# Patient Record
Sex: Female | Born: 1950
Health system: Southern US, Community
[De-identification: ages and names within clinical notes are randomized; demographics above are authoritative.]

## PROBLEM LIST (undated history)

## (undated) DIAGNOSIS — D649 Anemia, unspecified: Secondary | ICD-10-CM

## (undated) DIAGNOSIS — I10 Essential (primary) hypertension: Secondary | ICD-10-CM

## (undated) DIAGNOSIS — Z9889 Other specified postprocedural states: Secondary | ICD-10-CM

## (undated) DIAGNOSIS — G839 Paralytic syndrome, unspecified: Secondary | ICD-10-CM

## (undated) DIAGNOSIS — E119 Type 2 diabetes mellitus without complications: Secondary | ICD-10-CM

## (undated) DIAGNOSIS — R569 Unspecified convulsions: Secondary | ICD-10-CM

## (undated) DIAGNOSIS — N179 Acute kidney failure, unspecified: Secondary | ICD-10-CM

## (undated) DIAGNOSIS — G8929 Other chronic pain: Secondary | ICD-10-CM

## (undated) DIAGNOSIS — R112 Nausea with vomiting, unspecified: Secondary | ICD-10-CM

## (undated) DIAGNOSIS — I639 Cerebral infarction, unspecified: Secondary | ICD-10-CM

## (undated) DIAGNOSIS — I2510 Atherosclerotic heart disease of native coronary artery without angina pectoris: Secondary | ICD-10-CM

## (undated) DIAGNOSIS — I499 Cardiac arrhythmia, unspecified: Secondary | ICD-10-CM

## (undated) HISTORY — DX: Unspecified convulsions: R56.9

## (undated) HISTORY — PX: ECTOPIC PREGNANCY SURGERY: SHX613

## (undated) HISTORY — DX: Acute kidney failure, unspecified: N17.9

## (undated) HISTORY — DX: Type 2 diabetes mellitus without complications: E11.9

## (undated) HISTORY — DX: Essential (primary) hypertension: I10

---

## 2009-05-07 ENCOUNTER — Emergency Department: Payer: Self-pay | Admitting: Emergency Medicine

## 2019-06-06 ENCOUNTER — Inpatient Hospital Stay
Admission: EM | Admit: 2019-06-06 | Discharge: 2019-06-09 | DRG: 065 | Disposition: A | Discharge status: Rehabilitation Facility | Payer: PPO | Attending: Internal Medicine | Admitting: Internal Medicine

## 2019-06-06 ENCOUNTER — Encounter: Payer: Self-pay | Admitting: Emergency Medicine

## 2019-06-06 ENCOUNTER — Emergency Department: Payer: PPO

## 2019-06-06 ENCOUNTER — Other Ambulatory Visit: Payer: Self-pay

## 2019-06-06 ENCOUNTER — Observation Stay: Payer: PPO

## 2019-06-06 DIAGNOSIS — F17210 Nicotine dependence, cigarettes, uncomplicated: Secondary | ICD-10-CM | POA: Diagnosis present

## 2019-06-06 DIAGNOSIS — R270 Ataxia, unspecified: Secondary | ICD-10-CM | POA: Diagnosis present

## 2019-06-06 DIAGNOSIS — R414 Neurologic neglect syndrome: Secondary | ICD-10-CM | POA: Diagnosis present

## 2019-06-06 DIAGNOSIS — Z79899 Other long term (current) drug therapy: Secondary | ICD-10-CM

## 2019-06-06 DIAGNOSIS — Y9389 Activity, other specified: Secondary | ICD-10-CM

## 2019-06-06 DIAGNOSIS — I6523 Occlusion and stenosis of bilateral carotid arteries: Secondary | ICD-10-CM | POA: Diagnosis present

## 2019-06-06 DIAGNOSIS — I63233 Cerebral infarction due to unspecified occlusion or stenosis of bilateral carotid arteries: Secondary | ICD-10-CM | POA: Diagnosis not present

## 2019-06-06 DIAGNOSIS — W1839XA Other fall on same level, initial encounter: Secondary | ICD-10-CM | POA: Diagnosis present

## 2019-06-06 DIAGNOSIS — R29703 NIHSS score 3: Secondary | ICD-10-CM | POA: Diagnosis present

## 2019-06-06 DIAGNOSIS — I63511 Cerebral infarction due to unspecified occlusion or stenosis of right middle cerebral artery: Secondary | ICD-10-CM | POA: Diagnosis present

## 2019-06-06 DIAGNOSIS — R1312 Dysphagia, oropharyngeal phase: Secondary | ICD-10-CM | POA: Diagnosis present

## 2019-06-06 DIAGNOSIS — G8194 Hemiplegia, unspecified affecting left nondominant side: Secondary | ICD-10-CM | POA: Diagnosis present

## 2019-06-06 DIAGNOSIS — R4189 Other symptoms and signs involving cognitive functions and awareness: Secondary | ICD-10-CM | POA: Diagnosis present

## 2019-06-06 DIAGNOSIS — I639 Cerebral infarction, unspecified: Secondary | ICD-10-CM | POA: Diagnosis present

## 2019-06-06 DIAGNOSIS — Z03818 Encounter for observation for suspected exposure to other biological agents ruled out: Secondary | ICD-10-CM | POA: Diagnosis not present

## 2019-06-06 DIAGNOSIS — I10 Essential (primary) hypertension: Secondary | ICD-10-CM | POA: Diagnosis present

## 2019-06-06 DIAGNOSIS — I63231 Cerebral infarction due to unspecified occlusion or stenosis of right carotid arteries: Secondary | ICD-10-CM | POA: Diagnosis not present

## 2019-06-06 DIAGNOSIS — M25512 Pain in left shoulder: Secondary | ICD-10-CM | POA: Diagnosis present

## 2019-06-06 DIAGNOSIS — S01511A Laceration without foreign body of lip, initial encounter: Secondary | ICD-10-CM | POA: Diagnosis present

## 2019-06-06 DIAGNOSIS — E785 Hyperlipidemia, unspecified: Secondary | ICD-10-CM | POA: Diagnosis present

## 2019-06-06 DIAGNOSIS — G8929 Other chronic pain: Secondary | ICD-10-CM | POA: Diagnosis present

## 2019-06-06 DIAGNOSIS — I63411 Cerebral infarction due to embolism of right middle cerebral artery: Secondary | ICD-10-CM | POA: Diagnosis present

## 2019-06-06 DIAGNOSIS — R569 Unspecified convulsions: Secondary | ICD-10-CM | POA: Diagnosis not present

## 2019-06-06 DIAGNOSIS — Z1159 Encounter for screening for other viral diseases: Secondary | ICD-10-CM | POA: Diagnosis not present

## 2019-06-06 DIAGNOSIS — Z8249 Family history of ischemic heart disease and other diseases of the circulatory system: Secondary | ICD-10-CM | POA: Diagnosis not present

## 2019-06-06 DIAGNOSIS — Z825 Family history of asthma and other chronic lower respiratory diseases: Secondary | ICD-10-CM | POA: Diagnosis not present

## 2019-06-06 DIAGNOSIS — I63239 Cerebral infarction due to unspecified occlusion or stenosis of unspecified carotid arteries: Secondary | ICD-10-CM

## 2019-06-06 DIAGNOSIS — Y92238 Other place in hospital as the place of occurrence of the external cause: Secondary | ICD-10-CM | POA: Diagnosis present

## 2019-06-06 DIAGNOSIS — Z833 Family history of diabetes mellitus: Secondary | ICD-10-CM

## 2019-06-06 DIAGNOSIS — G4089 Other seizures: Secondary | ICD-10-CM | POA: Diagnosis present

## 2019-06-06 DIAGNOSIS — Z6841 Body Mass Index (BMI) 40.0 and over, adult: Secondary | ICD-10-CM

## 2019-06-06 DIAGNOSIS — F17200 Nicotine dependence, unspecified, uncomplicated: Secondary | ICD-10-CM

## 2019-06-06 DIAGNOSIS — E1165 Type 2 diabetes mellitus with hyperglycemia: Secondary | ICD-10-CM | POA: Diagnosis present

## 2019-06-06 DIAGNOSIS — G936 Cerebral edema: Secondary | ICD-10-CM | POA: Diagnosis not present

## 2019-06-06 HISTORY — DX: Other chronic pain: G89.29

## 2019-06-06 LAB — URINE DRUG SCREEN, QUALITATIVE (ARMC ONLY)
Amphetamines, Ur Screen: NOT DETECTED
Barbiturates, Ur Screen: NOT DETECTED
Benzodiazepine, Ur Scrn: NOT DETECTED
Cannabinoid 50 Ng, Ur ~~LOC~~: NOT DETECTED
Cocaine Metabolite,Ur ~~LOC~~: NOT DETECTED
MDMA (Ecstasy)Ur Screen: NOT DETECTED
Methadone Scn, Ur: NOT DETECTED
Opiate, Ur Screen: NOT DETECTED
Phencyclidine (PCP) Ur S: NOT DETECTED
Tricyclic, Ur Screen: NOT DETECTED

## 2019-06-06 LAB — COMPREHENSIVE METABOLIC PANEL
ALT: 22 U/L (ref 0–44)
AST: 26 U/L (ref 15–41)
Albumin: 4.7 g/dL (ref 3.5–5.0)
Alkaline Phosphatase: 86 U/L (ref 38–126)
Anion gap: 16 — ABNORMAL HIGH (ref 5–15)
BUN: 12 mg/dL (ref 8–23)
CO2: 20 mmol/L — ABNORMAL LOW (ref 22–32)
Calcium: 9.8 mg/dL (ref 8.9–10.3)
Chloride: 102 mmol/L (ref 98–111)
Creatinine, Ser: 0.94 mg/dL (ref 0.44–1.00)
GFR calc Af Amer: >60 mL/min (ref >60)
GFR calc non Af Amer: >60 mL/min (ref >60)
Glucose, Bld: 283 mg/dL — ABNORMAL HIGH (ref 70–99)
Potassium: 3.6 mmol/L (ref 3.5–5.1)
Sodium: 138 mmol/L (ref 135–145)
Total Bilirubin: 0.9 mg/dL (ref 0.3–1.2)
Total Protein: 8.1 g/dL (ref 6.5–8.1)

## 2019-06-06 LAB — URINALYSIS, ROUTINE W REFLEX MICROSCOPIC
Bilirubin Urine: NEGATIVE
Glucose, UA: >=500 mg/dL — AB
Ketones, ur: 20 mg/dL — AB
Leukocytes,Ua: NEGATIVE
Nitrite: NEGATIVE
Protein, ur: 100 mg/dL — AB
Specific Gravity, Urine: 1.04 — ABNORMAL HIGH (ref 1.005–1.030)
pH: 6 (ref 5.0–8.0)

## 2019-06-06 LAB — CBC
HCT: 46.5 % — ABNORMAL HIGH (ref 36.0–46.0)
Hemoglobin: 16.3 g/dL — ABNORMAL HIGH (ref 12.0–15.0)
MCH: 30.6 pg (ref 26.0–34.0)
MCHC: 35.1 g/dL (ref 30.0–36.0)
MCV: 87.2 fL (ref 80.0–100.0)
Platelets: 211 10*3/uL (ref 150–400)
RBC: 5.33 MIL/uL — ABNORMAL HIGH (ref 3.87–5.11)
RDW: 13.3 % (ref 11.5–15.5)
WBC: 9.6 10*3/uL (ref 4.0–10.5)
nRBC: 0 % (ref 0.0–0.2)

## 2019-06-06 LAB — DIFFERENTIAL
Abs Immature Granulocytes: 0.04 10*3/uL (ref 0.00–0.07)
Basophils Absolute: 0.1 10*3/uL (ref 0.0–0.1)
Basophils Relative: 1 %
Eosinophils Absolute: 0 10*3/uL (ref 0.0–0.5)
Eosinophils Relative: 0 %
Immature Granulocytes: 0 %
Lymphocytes Relative: 33 %
Lymphs Abs: 3.1 10*3/uL (ref 0.7–4.0)
Monocytes Absolute: 0.6 10*3/uL (ref 0.1–1.0)
Monocytes Relative: 6 %
Neutro Abs: 5.8 10*3/uL (ref 1.7–7.7)
Neutrophils Relative %: 60 %

## 2019-06-06 LAB — APTT: aPTT: 31 seconds (ref 24–36)

## 2019-06-06 LAB — SARS CORONAVIRUS 2 BY RT PCR (HOSPITAL ORDER, PERFORMED IN ~~LOC~~ HOSPITAL LAB): SARS Coronavirus 2: NEGATIVE

## 2019-06-06 LAB — PROTIME-INR
INR: 1 (ref 0.8–1.2)
Prothrombin Time: 13 seconds (ref 11.4–15.2)

## 2019-06-06 LAB — GLUCOSE, CAPILLARY: Glucose-Capillary: 229 mg/dL — ABNORMAL HIGH (ref 70–99)

## 2019-06-06 MED ORDER — ASPIRIN 325 MG PO TABS
325.0000 mg | ORAL_TABLET | Freq: Every day | ORAL | Status: DC
  Filled 2019-06-06: qty 1

## 2019-06-06 MED ORDER — ACETAMINOPHEN 160 MG/5ML PO SOLN
650.0000 mg | ORAL | Status: DC | PRN
  Filled 2019-06-06: qty 20.3

## 2019-06-06 MED ORDER — ATORVASTATIN CALCIUM 20 MG PO TABS
40.0000 mg | ORAL_TABLET | Freq: Every day | ORAL | Status: DC
  Administered 2019-06-06 – 2019-06-08 (×3): 40 mg via ORAL
  Filled 2019-06-06 (×3): qty 2

## 2019-06-06 MED ORDER — ACETAMINOPHEN 325 MG PO TABS
650.0000 mg | ORAL_TABLET | ORAL | Status: DC | PRN
  Administered 2019-06-09: 650 mg via ORAL
  Filled 2019-06-06: qty 2

## 2019-06-06 MED ORDER — INSULIN ASPART 100 UNIT/ML ~~LOC~~ SOLN
0.0000 [IU] | Freq: Three times a day (TID) | SUBCUTANEOUS | Status: DC
  Administered 2019-06-06: 3 [IU] via SUBCUTANEOUS
  Administered 2019-06-07: 2 [IU] via SUBCUTANEOUS
  Administered 2019-06-07 (×2): 3 [IU] via SUBCUTANEOUS
  Administered 2019-06-08: 2 [IU] via SUBCUTANEOUS
  Administered 2019-06-08: 3 [IU] via SUBCUTANEOUS
  Administered 2019-06-08: 2 [IU] via SUBCUTANEOUS
  Administered 2019-06-09: 3 [IU] via SUBCUTANEOUS
  Filled 2019-06-06 (×8): qty 1

## 2019-06-06 MED ORDER — ASPIRIN 81 MG PO CHEW
324.0000 mg | CHEWABLE_TABLET | Freq: Once | ORAL | Status: AC
  Administered 2019-06-06: 324 mg via ORAL
  Filled 2019-06-06: qty 4

## 2019-06-06 MED ORDER — HYDRALAZINE HCL 20 MG/ML IJ SOLN
10.0000 mg | Freq: Four times a day (QID) | INTRAMUSCULAR | Status: DC | PRN
  Administered 2019-06-06 (×2): 10 mg via INTRAVENOUS
  Filled 2019-06-06 (×2): qty 1

## 2019-06-06 MED ORDER — ACETAMINOPHEN 650 MG RE SUPP: 650.0000 mg | RECTAL | Status: DC | PRN

## 2019-06-06 MED ORDER — ONDANSETRON HCL 4 MG/2ML IJ SOLN: 4.0000 mg | Freq: Four times a day (QID) | INTRAMUSCULAR | Status: DC | PRN

## 2019-06-06 MED ORDER — ENOXAPARIN SODIUM 40 MG/0.4ML ~~LOC~~ SOLN
40.0000 mg | Freq: Every day | SUBCUTANEOUS | Status: DC
  Administered 2019-06-06 – 2019-06-09 (×4): 40 mg via SUBCUTANEOUS
  Filled 2019-06-06 (×4): qty 0.4

## 2019-06-06 MED ORDER — LIDOCAINE-EPINEPHRINE (PF) 2 %-1:200000 IJ SOLN
20.0000 mL | Freq: Once | INTRAMUSCULAR | Status: AC
  Administered 2019-06-06: 20 mL
  Filled 2019-06-06: qty 20

## 2019-06-06 MED ORDER — SENNOSIDES-DOCUSATE SODIUM 8.6-50 MG PO TABS
1.0000 | ORAL_TABLET | Freq: Every evening | ORAL | Status: DC | PRN
  Administered 2019-06-08: 1 via ORAL
  Filled 2019-06-06: qty 1

## 2019-06-06 MED ORDER — LABETALOL HCL 5 MG/ML IV SOLN
20.0000 mg | Freq: Once | INTRAVENOUS | Status: AC
  Administered 2019-06-06: 20 mg via INTRAVENOUS
  Filled 2019-06-06: qty 4

## 2019-06-06 MED ORDER — IOHEXOL 350 MG/ML SOLN
75.0000 mL | Freq: Once | INTRAVENOUS | Status: AC | PRN
  Administered 2019-06-06: 75 mL via INTRAVENOUS

## 2019-06-06 MED ORDER — LISINOPRIL 10 MG PO TABS
10.0000 mg | ORAL_TABLET | Freq: Every day | ORAL | Status: DC
  Administered 2019-06-06 – 2019-06-09 (×4): 10 mg via ORAL
  Filled 2019-06-06 (×4): qty 1

## 2019-06-06 MED ORDER — STROKE: EARLY STAGES OF RECOVERY BOOK
Freq: Once | Status: AC
  Administered 2019-06-06: 15:00:00

## 2019-06-06 MED ORDER — ASPIRIN 300 MG RE SUPP: 300.0000 mg | Freq: Every day | RECTAL | Status: DC

## 2019-06-06 NOTE — ED Notes (Signed)
Pt back from CT at this time 

## 2019-06-06 NOTE — ED Notes (Signed)
Pt transported to 1C by EDT Mickel Baas at this time

## 2019-06-06 NOTE — ED Notes (Signed)
CODE STROKE CALLED TO [id number] 

## 2019-06-06 NOTE — H&P (Addendum)
[facility] NAME: Tammy Nunez    MR#:  [id number]  DATE OF BIRTH:  01-Dec-1950  DATE OF ADMISSION:  06/06/2019  PRIMARY CARE PHYSICIAN: Patient, No Pcp Per   REQUESTING/REFERRING PHYSICIAN: Dr. Joni Fears  CHIEF COMPLAINT:   Chief Complaint  Patient presents with  . Numbness    HISTORY OF PRESENT ILLNESS:  Tammy Nunez  is a 68 y.o. female with no known past medical history presents to the emergency room complaining of acute onset of left-sided numbness and weakness from the time she woke up at 5:30 AM.  Slept at 10 PM when she was fine.  In the ER patient had a CT scan which shows right frontal and parietal stroke.  Seen by tele neurology.  Patient is being admitted for further stroke work-up.  She has no dysphagia or vision changes.  No shortness of breath or palpitations.  Not on aspirin or statin at home.  No history of A. fib.  Does smoke. Here her blood pressure is significantly elevated and also blood glucose is at 285.  No diagnosis of hypertension or diabetes. She has not seen a doctor in many years.  PAST MEDICAL HISTORY:  History reviewed. No pertinent past medical history. Does not have a primary care physician and no diagnosis. PAST SURGICAL HISTORY:   Past Surgical History:  Procedure Laterality Date  . ECTOPIC PREGNANCY SURGERY      SOCIAL HISTORY:   Social History   Tobacco Use  . Smoking status: Current Every Day Smoker  . Smokeless tobacco: Never Used  Substance Use Topics  . Alcohol use: Never    Frequency: Never    FAMILY HISTORY:   Hypertension DRUG ALLERGIES:  No Known Allergies  REVIEW OF SYSTEMS:   Review of Systems  Constitutional: Positive for malaise/fatigue. Negative for chills and fever.  HENT: Negative for sore throat.   Eyes: Negative for blurred vision, double vision and pain.  Respiratory: Negative for cough, hemoptysis, shortness of breath and wheezing.   Cardiovascular:  Negative for chest pain, palpitations, orthopnea and leg swelling.  Gastrointestinal: Negative for abdominal pain, constipation, diarrhea, heartburn, nausea and vomiting.  Genitourinary: Negative for dysuria and hematuria.  Musculoskeletal: Negative for back pain and joint pain.  Skin: Negative for rash.  Neurological: Positive for focal weakness. Negative for sensory change, speech change and headaches.  Endo/Heme/Allergies: Does not bruise/bleed easily.  Psychiatric/Behavioral: Negative for depression. The patient is not nervous/anxious.     MEDICATIONS AT HOME:   Prior to Admission medications   Medication Sig Start Date End Date Taking? Authorizing Provider  ibuprofen (ADVIL) 200 MG tablet Take 200-400 mg by mouth every 6 (six) hours as needed for fever or mild pain.   Yes [provider]  vitamin B-12 (CYANOCOBALAMIN) 1000 MCG tablet Take 1,000 mcg by mouth daily.   Yes [provider]     VITAL SIGNS:  Blood pressure (!) 214/110, pulse (!) 101, resp. rate 17, height 4\' 4"  (1.321 m), weight 77.1 kg, SpO2 98 %.  PHYSICAL EXAMINATION:  Physical Exam  GENERAL:  68 y.o.-year-old patient lying in the bed with no acute distress.  EYES: Pupils equal, round, reactive to light and accommodation. No scleral icterus. Extraocular muscles intact.  HEENT: Head atraumatic, normocephalic. Oropharynx and nasopharynx clear. No oropharyngeal erythema, moist oral mucosa  NECK:  Supple, no jugular venous distention. No thyroid enlargement, no tenderness.  LUNGS: Normal breath sounds bilaterally, no wheezing, rales, rhonchi. No use  of accessory muscles of respiration.  CARDIOVASCULAR: S1, S2 normal. No murmurs, rubs, or gallops.  ABDOMEN: Soft, nontender, nondistended. Bowel sounds present. No organomegaly or mass.  EXTREMITIES: No pedal edema, cyanosis, or clubbing. + 2 pedal & radial pulses b/l.   NEUROLOGIC: Cranial nerves II through XII are intact.  Decreased motor strength on  the left.  Decreased sensations on the left. PSYCHIATRIC: The patient is alert and oriented x 3. Good affect.  SKIN: No obvious rash, lesion, or ulcer.   LABORATORY PANEL:   CBC Recent Labs  Lab 06/06/19 0636  WBC 9.6  HGB 16.3*  HCT 46.5*  PLT 211   ------------------------------------------------------------------------------------------------------------------  Chemistries  Recent Labs  Lab 06/06/19 0636  NA 138  K 3.6  CL 102  CO2 20*  GLUCOSE 283*  BUN 12  CREATININE 0.94  CALCIUM 9.8  AST 26  ALT 22  ALKPHOS 86  BILITOT 0.9   ------------------------------------------------------------------------------------------------------------------  Cardiac Enzymes No results for input(s): TROPONINI in the last 168 hours. ------------------------------------------------------------------------------------------------------------------  RADIOLOGY:  Ct Angio Head W Or Wo Contrast  Result Date: 06/06/2019 CLINICAL DATA:  Numbness of the left arm upon wakening today. EXAM: CT ANGIOGRAPHY HEAD AND NECK TECHNIQUE: Multidetector CT imaging of the head and neck was performed using the standard protocol during bolus administration of intravenous contrast. Multiplanar CT image reconstructions and MIPs were obtained to evaluate the vascular anatomy. Carotid stenosis measurements (when applicable) are obtained utilizing NASCET criteria, using the distal internal carotid diameter as the denominator. CONTRAST:  53mL OMNIPAQUE IOHEXOL 350 MG/ML SOLN COMPARISON:  Head CT same day FINDINGS: CTA NECK FINDINGS Aortic arch: Aortic atherosclerosis.  No aneurysm or dissection. Right carotid system: Common carotid artery is patent to the bifurcation. There is advanced soft and calcified plaque at the carotid bifurcation and ICA bulb with near occlusion in the proximal bulb. Antegrade flow persists but minimal diameter is less than 1 mm. Cervical ICA is small because of flow reduction. Left carotid  system: Common carotid artery is patent to the bifurcation region. Extensive soft and calcified plaque at the carotid bifurcation and ICA bulb. Minimal diameter in the ICA bulb is 1.5 mm. Caliber of the more distal ICA is reduced likely due to flow reduction. Question serial stenoses in the more distal cervical ICA. Stenosis at the ICA bulb is estimated at 70% or greater. Difficult to apply NASCET criteria with distal flow reduction. Vertebral arteries: Vertebral artery origins are patent. The vertebral arteries are small vessels that are patent through the cervical region to the foramen magnum. Incidental note of a 40-50% stenosis of the left subclavian artery at the apex. Skeleton: Degenerative cervical spondylosis. Other neck: No mass or lymphadenopathy. Upper chest: Normal Review of the MIP images confirms the above findings CTA HEAD FINDINGS Anterior circulation: Both internal carotid arteries are patent through the skull base and siphon regions. There is severe atherosclerotic disease in the carotid siphon regions with narrowing. Both vessels appear patent through that area but are very small caliber. The anterior and middle cerebral vessels are patent but also are very small caliber. I cannot identify a proximal embolic occlusion that could be treatable with mechanical intervention. Posterior circulation: Both vertebral arteries are patent through the foramen magnum to the basilar. No basilar stenosis. Posterior circulation branch vessels show flow. Venous sinuses: Patent and normal. Anatomic variants: None significant. Delayed phase: Not performed. Review of the MIP images confirms the above findings IMPRESSION: Severe atherosclerotic disease at both carotid bifurcation and ICA bulbs, right  worse than left. Luminal diameter on the right is 1 mm or less in the ICA bulb. Luminal diameter on the left is 1.5 mm in the ICA bulb. There is flow reduction in the diameter of both distal cervical internal carotid  arteries, making application of NASCET criteria difficult. Stenosis is estimated at 80% or worse on the right and 70% or worse on the left. No large or medium vessel intracranial occlusion identified. Severe atherosclerotic disease in both carotid siphon regions with narrowing of both internal carotid arteries through that segment. These results were called by telephone at the time of interpretation on 06/06/2019 at 8:45 am to Dr. Carrie Mew , who verbally acknowledged these results. Electronically Signed   By: Nelson Chimes M.D.   On: 06/06/2019 08:54   Ct Head Wo Contrast  Result Date: 06/06/2019 CLINICAL DATA:  Left arm numbness upon awakening EXAM: CT HEAD WITHOUT CONTRAST TECHNIQUE: Contiguous axial images were obtained from the base of the skull through the vertex without intravenous contrast. COMPARISON:  None. FINDINGS: Brain: Cytotoxic edema in the posterior right frontal and low right parietal regions, acute appearing. No hemorrhage, hydrocephalus, or masslike finding. No pre-existing infarct is seen. Vascular: No hyperdense vessel or unexpected calcification. Skull: Normal. Negative for fracture or focal lesion. Sinuses/Orbits: No acute finding. IMPRESSION: Moderate acute appearing infarct in the right posterior frontal and parietal lobes. Electronically Signed   By: Monte Fantasia M.D.   On: 06/06/2019 07:03   Ct Angio Neck W And/or Wo Contrast  Result Date: 06/06/2019 CLINICAL DATA:  Numbness of the left arm upon wakening today. EXAM: CT ANGIOGRAPHY HEAD AND NECK TECHNIQUE: Multidetector CT imaging of the head and neck was performed using the standard protocol during bolus administration of intravenous contrast. Multiplanar CT image reconstructions and MIPs were obtained to evaluate the vascular anatomy. Carotid stenosis measurements (when applicable) are obtained utilizing NASCET criteria, using the distal internal carotid diameter as the denominator. CONTRAST:  63mL OMNIPAQUE IOHEXOL 350  MG/ML SOLN COMPARISON:  Head CT same day FINDINGS: CTA NECK FINDINGS Aortic arch: Aortic atherosclerosis.  No aneurysm or dissection. Right carotid system: Common carotid artery is patent to the bifurcation. There is advanced soft and calcified plaque at the carotid bifurcation and ICA bulb with near occlusion in the proximal bulb. Antegrade flow persists but minimal diameter is less than 1 mm. Cervical ICA is small because of flow reduction. Left carotid system: Common carotid artery is patent to the bifurcation region. Extensive soft and calcified plaque at the carotid bifurcation and ICA bulb. Minimal diameter in the ICA bulb is 1.5 mm. Caliber of the more distal ICA is reduced likely due to flow reduction. Question serial stenoses in the more distal cervical ICA. Stenosis at the ICA bulb is estimated at 70% or greater. Difficult to apply NASCET criteria with distal flow reduction. Vertebral arteries: Vertebral artery origins are patent. The vertebral arteries are small vessels that are patent through the cervical region to the foramen magnum. Incidental note of a 40-50% stenosis of the left subclavian artery at the apex. Skeleton: Degenerative cervical spondylosis. Other neck: No mass or lymphadenopathy. Upper chest: Normal Review of the MIP images confirms the above findings CTA HEAD FINDINGS Anterior circulation: Both internal carotid arteries are patent through the skull base and siphon regions. There is severe atherosclerotic disease in the carotid siphon regions with narrowing. Both vessels appear patent through that area but are very small caliber. The anterior and middle cerebral vessels are patent but also are  very small caliber. I cannot identify a proximal embolic occlusion that could be treatable with mechanical intervention. Posterior circulation: Both vertebral arteries are patent through the foramen magnum to the basilar. No basilar stenosis. Posterior circulation branch vessels show flow. Venous  sinuses: Patent and normal. Anatomic variants: None significant. Delayed phase: Not performed. Review of the MIP images confirms the above findings IMPRESSION: Severe atherosclerotic disease at both carotid bifurcation and ICA bulbs, right worse than left. Luminal diameter on the right is 1 mm or less in the ICA bulb. Luminal diameter on the left is 1.5 mm in the ICA bulb. There is flow reduction in the diameter of both distal cervical internal carotid arteries, making application of NASCET criteria difficult. Stenosis is estimated at 80% or worse on the right and 70% or worse on the left. No large or medium vessel intracranial occlusion identified. Severe atherosclerotic disease in both carotid siphon regions with narrowing of both internal carotid arteries through that segment. These results were called by telephone at the time of interpretation on 06/06/2019 at 8:45 am to Dr. Carrie Mew , who verbally acknowledged these results. Electronically Signed   By: Nelson Chimes M.D.   On: 06/06/2019 08:54     IMPRESSION AND PLAN:   *Acute CVA of the right parietal and frontal lobes. -Check MRI of the brain, Echo - Start aspirin and statin. - Lovenox for DVT prophylaxis. - PT/OT/Speech consult as needed per symptoms - Neuro checks every 4 hours for 24 hours. - Consulted neurology.  *Bilateral severe carotid artery stenosis Consult vascular surgery.  Sent message to Dr. Delana Meyer.  *Hypertension.  Significant elevation.  Likely has undiagnosed hypertension.  Will start low-dose lisinopril.  Would like permissive hypertension at this point.  IV hydralazine ordered as needed  *Hyperglycemia.  285.  Check hemoglobin A1c.  Ordered Accu-Cheks and sliding scale insulin.  Likely has undiagnosed diabetes mellitus.  Diabetic diet.  *Tobacco abuse.  Counseled to quit smoking for greater than 3 minutes.  DVT prophylaxis with Lovenox   All the records are reviewed and case discussed with ED  provider. Management plans discussed with the patient, family and they are in agreement.  CODE STATUS: Full code  TOTAL TIME TAKING CARE OF THIS PATIENT: 35 minutes.   Neita Carp M.D on 06/06/2019 at 12:47 PM  Between 7am to 6pm - Pager - [phone]  After 6pm go to [email or web address] - password [email or web address]  Office  [phone]  CC: Primary care physician; Patient, No Pcp Per  Note: This dictation was prepared with Dragon dictation along with smaller phrase technology. Any transcriptional errors that result from this process are unintentional.

## 2019-06-06 NOTE — Plan of Care (Signed)

## 2019-06-06 NOTE — ED Notes (Signed)
Pt back from MRI 

## 2019-06-06 NOTE — ED Notes (Signed)
Attempted to call report and informed that RN is in middle of discharge and unaware of getting pt. RN to call back shortly.

## 2019-06-06 NOTE — Progress Notes (Signed)
   06/06/19 0727  Clinical Encounter Type  Visited With Patient  Visit Type Initial  Referral From Nurse  Consult/Referral To Chaplain  Spiritual Encounters  Spiritual Needs Prayer;Emotional  Patient was awake and alert upon arrival. Pt was able to make wishes known. [place] completed initial spiritual care screening. Patient has emotional and spiritual support system in the community. Author prayed with patient upon request. Pastoral care visit was appreciated. No further needs are known at this time.

## 2019-06-06 NOTE — ED Notes (Signed)
Pt transported to MRI 

## 2019-06-06 NOTE — ED Notes (Signed)
Pt to CT at this time.

## 2019-06-06 NOTE — ED Triage Notes (Signed)
Patient with complaint of numbness to her left arm when she woke up this am. Patient states that she went to bed at 22:00 last night.

## 2019-06-06 NOTE — Care Management Obs Status (Signed)
[place] NOTIFICATION   Patient Details  Name: Tammy Nunez MRN: [id number] Date of Birth: December 25, 1950   Medicare Observation Status Notification Given:  Yes    Ceirra Belli, RN 06/06/2019, 10:52 AM

## 2019-06-06 NOTE — ED Provider Notes (Addendum)
[facility] Emergency Department Provider Note  ____________________________________________  Time seen: Approximately 8:04 AM  I have reviewed the triage vital signs and the nursing notes.   HISTORY  Chief Complaint Numbness    HPI Tammy Nunez is a 68 y.o. female with no known past medical history (also no established primary care) who complains of left-sided weakness and numbness that started about 10:00 PM last night suddenly.  Constant since then.  Associated with a mild bilateral frontal headache.  No aggravating or alleviating factors.  Still persistent this morning so she comes to the ED for evaluation.  Never had anything like this before.  No recent trauma fevers chills sweats body aches chest pain or shortness of breath.  Denies palpitations or syncope.      History reviewed. No pertinent past medical history.   There are no active problems to display for this patient.    Past Surgical History:  Procedure Laterality Date  . ECTOPIC PREGNANCY SURGERY       Prior to Admission medications   Medication Sig Start Date End Date Taking? Authorizing Provider  ibuprofen (ADVIL) 200 MG tablet Take 200-400 mg by mouth every 6 (six) hours as needed for fever or mild pain.   Yes [provider]  vitamin B-12 (CYANOCOBALAMIN) 1000 MCG tablet Take 1,000 mcg by mouth daily.   Yes [provider]  None  Allergies Patient has no known allergies.   No family history on file.  Social History Social History   Tobacco Use  . Smoking status: Current Every Day Smoker  . Smokeless tobacco: Never Used  Substance Use Topics  . Alcohol use: Never    Frequency: Never  . Drug use: Never    Review of Systems  Constitutional:   No fever or chills.  ENT:   No sore throat. No rhinorrhea. Cardiovascular:   No chest pain or syncope. Respiratory:   No dyspnea or cough. Gastrointestinal:   Negative for abdominal pain, vomiting and  diarrhea.  Musculoskeletal:   Negative for focal pain or swelling All other systems reviewed and are negative except as documented above in ROS and HPI.  ____________________________________________   PHYSICAL EXAM:  VITAL SIGNS: ED Triage Vitals [06/06/19 0631]  Enc Vitals Group     BP      Pulse      Resp      Temp      Temp src      SpO2      Weight 170 lb (77.1 kg)     Height 4\' 4"  (1.321 m)     Head Circumference      Peak Flow      Pain Score 0     Pain Loc      Pain Edu?      Excl. in [place]?     Vital signs reviewed, nursing assessments reviewed.   Constitutional:   Alert and oriented. Non-toxic appearance. Eyes:   Conjunctivae are normal. EOMI without nystagmus. PERRL. ENT      Head:   Normocephalic and atraumatic.      Nose:   No congestion/rhinnorhea.       Mouth/Throat:   MMM, no pharyngeal erythema. No peritonsillar mass.       Neck:   No meningismus. Full ROM. Hematological/Lymphatic/Immunilogical:   No cervical lymphadenopathy. Cardiovascular:   RRR. Symmetric bilateral radial and DP pulses.  No murmurs. Cap refill less than 2 seconds. Respiratory:   Normal respiratory effort without tachypnea/retractions. Breath sounds  are clear and equal bilaterally. No wheezes/rales/rhonchi. Gastrointestinal:   Soft and nontender. Non distended. There is no CVA tenderness.  No rebound, rigidity, or guarding.  Musculoskeletal:   Normal range of motion in all extremities. No joint effusions.  No lower extremity tenderness.  No edema. Neurologic:   Normal speech and language.  Left facial droop Strength intact bilaterally Positive left upper extremity drift Positive left lower extremity drift Left-sided sensation diminished NIH stroke scale 4  Skin:    Skin is warm, dry and intact. No rash noted.  No petechiae, purpura, or bullae.  ____________________________________________    LABS (pertinent positives/negatives) (all labs ordered are listed, but only abnormal  results are displayed) Labs Reviewed  CBC - Abnormal; Notable for the following components:      Result Value   RBC 5.33 (*)    Hemoglobin 16.3 (*)    HCT 46.5 (*)    All other components within normal limits  COMPREHENSIVE METABOLIC PANEL - Abnormal; Notable for the following components:   CO2 20 (*)    Glucose, Bld 283 (*)    Anion gap 16 (*)    All other components within normal limits  SARS CORONAVIRUS 2 (HOSPITAL ORDER, [place] [facility])  PROTIME-INR  APTT  DIFFERENTIAL  URINALYSIS, ROUTINE W REFLEX MICROSCOPIC  URINE DRUG SCREEN, QUALITATIVE ([place])   ____________________________________________   EKG  Interpreted by me Sinus tachycardia rate 116, normal axis intervals QRS ST segments and T waves  ____________________________________________    RADIOLOGY  Ct Head Wo Contrast  Result Date: 06/06/2019 CLINICAL DATA:  Left arm numbness upon awakening EXAM: CT HEAD WITHOUT CONTRAST TECHNIQUE: Contiguous axial images were obtained from the base of the skull through the vertex without intravenous contrast. COMPARISON:  None. FINDINGS: Brain: Cytotoxic edema in the posterior right frontal and low right parietal regions, acute appearing. No hemorrhage, hydrocephalus, or masslike finding. No pre-existing infarct is seen. Vascular: No hyperdense vessel or unexpected calcification. Skull: Normal. Negative for fracture or focal lesion. Sinuses/Orbits: No acute finding. IMPRESSION: Moderate acute appearing infarct in the right posterior frontal and parietal lobes. Electronically Signed   By: Monte Fantasia M.D.   On: 06/06/2019 07:03    ____________________________________________   PROCEDURES .Critical Care Performed by: Carrie Mew, MD Authorized by: Carrie Mew, MD   Critical care provider statement:    Critical care time (minutes):  30   Critical care time was exclusive of:  Separately billable procedures and treating other patients    Critical care was necessary to treat or prevent imminent or life-threatening deterioration of the following conditions:  CNS failure or compromise and circulatory failure   Critical care was time spent personally by me on the following activities:  Development of treatment plan with patient or surrogate, discussions with consultants, evaluation of patient's response to treatment, examination of patient, obtaining history from patient or surrogate, ordering and performing treatments and interventions, ordering and review of laboratory studies, ordering and review of radiographic studies, pulse oximetry, re-evaluation of patient's condition and review of old charts Comments:         Marland KitchenMarland KitchenLaceration Repair  Date/Time: 06/06/2019 1:15 PM Performed by: Carrie Mew, MD Authorized by: Carrie Mew, MD   Consent:    Consent obtained:  Verbal   Consent given by:  Patient   Risks discussed:  Infection, pain, retained foreign body, poor cosmetic result and poor wound healing   Alternatives discussed:  No treatment, referral and delayed treatment Anesthesia (see MAR for  exact dosages):    Anesthesia method:  Local infiltration   Local anesthetic:  Lidocaine 1% w/o epi Laceration details:    Location:  Lip   Lip location:  Upper exterior lip (Involves vermilion border)   Length (cm):  2 Repair type:    Repair type:  Intermediate Pre-procedure details:    Preparation:  Patient was prepped and draped in usual sterile fashion Exploration:    Hemostasis achieved with:  Direct pressure   Wound exploration: entire depth of wound probed and visualized     Wound extent: no foreign bodies/material noted, no muscle damage noted, no underlying fracture noted and no vascular damage noted     Contaminated: no   Treatment:    Area cleansed with:  Saline and Betadine   Amount of cleaning:  Extensive   Irrigation solution:  Sterile saline   Visualized foreign bodies/material removed: no   Skin  repair:    Repair method:  Sutures   Suture size:  4-0   Wound skin closure material used: Ethilon.   Suture technique:  Simple interrupted   Number of sutures:  5 Approximation:    Approximation:  Close   Vermilion border: well-aligned   Post-procedure details:    Dressing:  Open (no dressing)   Patient tolerance of procedure:  Tolerated well, no immediate complications    ____________________________________________  DIFFERENTIAL DIAGNOSIS   Large vessel occlusive stroke, carotid dissection, carotid stenosis, carotid thrombosis, embolic stroke  CLINICAL IMPRESSION / ASSESSMENT AND PLAN / ED COURSE  Medications ordered in the ED: Medications  aspirin chewable tablet 324 mg (324 mg Oral Given 06/06/19 0811)  iohexol (OMNIPAQUE) 350 MG/ML injection 75 mL (75 mLs Intravenous Contrast Given 06/06/19 0818)    Pertinent labs & imaging results that were available during my care of the patient were reviewed by me and considered in my medical decision making (see chart for details).  Kamarah Bilotta was evaluated in Emergency Department on 06/06/2019 for the symptoms described in the history of present illness. She was evaluated in the context of the global COVID-19 pandemic, which necessitated consideration that the patient might be at risk for infection with the SARS-CoV-2 virus that causes COVID-19. Institutional protocols and algorithms that pertain to the evaluation of patients at risk for COVID-19 are in a state of rapid change based on information released by regulatory bodies including the CDC and federal and state organizations. These policies and algorithms were followed during the patient's care in the ED.     Clinical Course as of Jun 06 855  Mon Jun 06, 2019  0727 Patient presents with acute stroke symptoms, onset 10 PM last night.  CT scan is positive for two discrete ischemic strokes, one MCA territory and one possibly PCA.  Exam shows relatively intact strength, but  pronounced ataxia of the left upper extremity.  VAN negative, outside of TPA window, but with embolic vs potentially proximal vascular lesion, will consult neurology for consideration of angiogram/perfusion study.   [PS]  0801 Discussed with neurologist who recommends CT angiogram of head and neck.  Perfusion study not indicated as patient will not be a candidate for endovascular intervention.  Reviewed the consult note as well.  We will give full dose aspirin, goal systolic blood pressure less than 200. Allow permissive hypertension to maintain penumbra perfusion   [PS]  9629 CT angiogram discussed with radiology.  No retrievable large vessel occlusion, but there is severe carotid disease bilaterally including approximately 90% stenosis of the right carotid  at the bifurcation, greater than 70% stenosis on the left.  Continue initial medical management, I will page vascular surgery to initiate consult   [PS]    Clinical Course User Index [PS] Carrie Mew, MD     ----------------------------------------- 1:14 PM on 06/06/2019 -----------------------------------------  Per nurse was attempting to help the patient to the bedside commode, the patient attempted to stand on her own, which caused her to fall.  She sustained a laceration to her lip which I have repaired.  No worsening headache or neck pain or neurologic changes, no need to repeat imaging at this time.  Blood pressure is 623 systolic, I will give a dose of labetalol for blood pressure control, allowing for continued permissive hypertension with a max systolic blood pressure at 200.  I did previously discuss the case with Dr. Delana Meyer who is aware and consulting for the hospitalist.  ____________________________________________   FINAL CLINICAL IMPRESSION(S) / ED DIAGNOSES    Final diagnoses:  Acute ischemic right MCA stroke [facility])  Carotid artery stenosis with cerebral infarction [facility])     ED Discharge Orders    None       Portions of this note were generated with dragon dictation software. Dictation errors may occur despite best attempts at proofreading.   Carrie Mew, MD 06/06/19 7628    Carrie Mew, MD 06/06/19 1319

## 2019-06-06 NOTE — ED Notes (Signed)
Pt does have noticeable weakness on left hand grip and left foot flexion.

## 2019-06-06 NOTE — Progress Notes (Signed)
OT Cancellation Note  Patient Details Name: Tammy Nunez MRN: [id number] DOB: [birth year]   Cancelled Evaluation:    Reason Eval/Treat Not Completed: Patient at procedure or test/ unavailable. Thank you for the OT consult. Order received and chart reviewed. Pt noted to be out for MRI at this time and is pending transfer to unit. Will hold OT eval at this time and re-attempt at a later date/time as available and pt medically appropriate for OT eval.   Shara Blazing, M.S., OTR/L Ascom: [phone] 06/06/19, 11:45 AM

## 2019-06-06 NOTE — Consult Note (Signed)
[facility] V[facility] Consult Note  MRN : [id number]  Tammy Nunez is a 68 y.o. (07-22-1951) female who presents with chief complaint of  Chief Complaint  Patient presents with  . Numbness   History of Present Illness:  The patient is a 68 year old female with no known medical history at the time of arrival to the [facility] emergency department with a chief complaint of left upper/lower sided weakness.  The patient endorses a history of experiencing an acute onset of left upper/lower extremity weakness which started around 10 PM last night.  At the time of her arrival to the emergency department, the patient denied any significant past medical history or medications.  Unfortunately, the patient has not been under the care of her primary care physician for some years.  The patient is an active smoker.  The patient is still experiencing some left upper / lower extremity weakness.  She states that it "seems better".  She states that she is now able to lift her left arm whereas this morning she was unable.  The patient denies experiencing any amaurosis fugax or TIA like symptoms prior to her stroke.  The patient denies any fever, nausea vomiting.  Patient denies any shortness of breath or chest pain.  06/06/19 CTA Head / Neck: 1) Severe atherosclerotic disease at both carotid bifurcation and ICA bulbs, right worse than left. Luminal diameter on the right is 1 mm or less in the ICA bulb. Luminal diameter on the left is 1.5 mm in the ICA bulb. There is flow reduction in the diameter of both distal cervical internal carotid arteries, making application of NASCET criteria difficult. Stenosis is estimated at 80% or worse on the right and 70% or worse on the left. 2) No large or medium vessel intracranial occlusion identified. 3) Severe atherosclerotic disease in both carotid siphon regions with narrowing of both internal carotid arteries through that  segment.  06/06/19 CT Head: Moderate acute appearing infarct in the right posterior frontal and parietal lobes  Vascular surgery was consulted by Dr. Darvin Neighbours for further recommendations. Current Facility-Administered Medications  Medication Dose Route Frequency Provider Last Rate Last Dose  . acetaminophen (TYLENOL) tablet 650 mg  650 mg Oral Q4H PRN Hillary Bow, MD       Or  . acetaminophen (TYLENOL) solution 650 mg  650 mg Per Tube Q4H PRN Hillary Bow, MD       Or  . acetaminophen (TYLENOL) suppository 650 mg  650 mg Rectal Q4H PRN Hillary Bow, MD      . Derrill Memo ON 06/07/2019] aspirin suppository 300 mg  300 mg Rectal Daily Sudini, Alveta Heimlich, MD       Or  . Derrill Memo ON 06/07/2019] aspirin tablet 325 mg  325 mg Oral Daily Sudini, Srikar, MD      . atorvastatin (LIPITOR) tablet 40 mg  40 mg Oral q1800 Sudini, Srikar, MD      . enoxaparin (LOVENOX) injection 40 mg  40 mg Subcutaneous Daily Sudini, Srikar, MD   40 mg at 06/06/19 1321  . hydrALAZINE (APRESOLINE) injection 10 mg  10 mg Intravenous Q6H PRN Hillary Bow, MD   10 mg at 06/06/19 1121  . insulin aspart (novoLOG) injection 0-9 Units  0-9 Units Subcutaneous TID WC Sudini, Srikar, MD      . lisinopril (ZESTRIL) tablet 10 mg  10 mg Oral Daily Sudini, Srikar, MD      . ondansetron [facility]) injection 4 mg  4 mg Intravenous Q6H  PRN Hillary Bow, MD      . senna-docusate (Senokot-S) tablet 1 tablet  1 tablet Oral QHS PRN Hillary Bow, MD       History reviewed. No pertinent past medical history.  Past Surgical History:  Procedure Laterality Date  . ECTOPIC PREGNANCY SURGERY     Social History Social History   Tobacco Use  . Smoking status: Current Every Day Smoker  . Smokeless tobacco: Never Used  Substance Use Topics  . Alcohol use: Never    Frequency: Never  . Drug use: Never   Family History No family history on file.  Denies any family history of peripheral artery disease, venous disease or renal disease.  No  Known Allergies  REVIEW OF SYSTEMS (Negative unless checked)  Constitutional: [] Weight loss  [] Fever  [] Chills Cardiac: [] Chest pain   [] Chest pressure   [] Palpitations   [] Shortness of breath when laying flat   [] Shortness of breath at rest   [] Shortness of breath with exertion. Vascular:  [] Pain in legs with walking   [] Pain in legs at rest   [] Pain in legs when laying flat   [] Claudication   [] Pain in feet when walking  [] Pain in feet at rest  [] Pain in feet when laying flat   [] History of DVT   [] Phlebitis   [] Swelling in legs   [] Varicose veins   [] Non-healing ulcers Pulmonary:   [] Uses home oxygen   [] Productive cough   [] Hemoptysis   [] Wheeze  [] COPD   [] Asthma Neurologic:  [] Dizziness  [] Blackouts   [] Seizures   [x] History of stroke   [] History of TIA  [] Aphasia   [] Temporary blindness   [] Dysphagia   [x] Weakness or numbness in arms   [x] Weakness or numbness in legs Musculoskeletal:  [] Arthritis   [] Joint swelling   [] Joint pain   [] Low back pain Hematologic:  [] Easy bruising  [] Easy bleeding   [] Hypercoagulable state   [] Anemic  [] Hepatitis Gastrointestinal:  [] Blood in stool   [] Vomiting blood  [] Gastroesophageal reflux/heartburn   [] Difficulty swallowing. Genitourinary:  [] Chronic kidney disease   [] Difficult urination  [] Frequent urination  [] Burning with urination   [] Blood in urine Skin:  [] Rashes   [] Ulcers   [] Wounds Psychological:  [] History of anxiety   []  History of major depression.  Physical Examination  Vitals:   06/06/19 1315 06/06/19 1330 06/06/19 1345 06/06/19 1505  BP: (!) 212/95 (!) 180/82 (!) 179/82 (!) 210/96  Pulse: (!) 101 80 83 90  Resp: 13 11 13    Temp:    99.5 F (37.5 C)  TempSrc:    Oral  SpO2: 98% 96% 97% 99%  Weight:      Height:       Body mass index is 44.2 kg/m. Gen:  WD/WN, NAD Head: [place]/AT, No temporalis wasting. Prominent temp pulse not noted. Ear/Nose/Throat: Hearing grossly intact, nares w/o erythema or drainage, oropharynx w/o  Erythema/Exudate Eyes: Sclera non-icteric, conjunctiva clear Neck: Trachea midline.  No JVD.  Pulmonary:  Good air movement, respirations not labored, equal bilaterally.  Cardiac: RRR, normal S1, S2. Vascular:  Vessel Right Left  Radial Palpable Palpable  Ulnar Palpable Palpable  Brachial Palpable Palpable  Carotid Palpable, without bruit Palpable, without bruit  Aorta Not palpable N/A  Femoral Palpable Palpable  Popliteal Palpable Palpable  PT Palpable Palpable  DP Palpable Palpable   Gastrointestinal: soft, non-tender/non-distended. No guarding/reflex.  Musculoskeletal: M/S 5/5 throughout.  Extremities without ischemic changes.  No deformity or atrophy. No edema. Neurologic: Sensation grossly intact in extremities. Left Upper  Extremity: Able to the left extremity however grip is very weak.  Left lower extremity: Able to lift leg and push down with foot weakly. Speech is fluent. Motor exam as listed above. Psychiatric: Judgment intact, Mood & affect appropriate for pt's clinical situation. Dermatologic: No rashes or ulcers noted.  No cellulitis or open wounds. Lymph : No Cervical, Axillary, or Inguinal lymphadenopathy.  CBC Lab Results  Component Value Date   WBC 9.6 06/06/2019   HGB 16.3 (H) 06/06/2019   HCT 46.5 (H) 06/06/2019   MCV 87.2 06/06/2019   PLT 211 06/06/2019   BMET    Component Value Date/Time   NA 138 06/06/2019 0636   K 3.6 06/06/2019 0636   CL 102 06/06/2019 0636   CO2 20 (L) 06/06/2019 0636   GLUCOSE 283 (H) 06/06/2019 0636   BUN 12 06/06/2019 0636   CREATININE 0.94 06/06/2019 0636   CALCIUM 9.8 06/06/2019 0636   GFRNONAA >60 06/06/2019 0636   GFRAA >60 06/06/2019 0636   Estimated Creatinine Clearance: 42.6 mL/min (by C-G formula based on SCr of 0.94 mg/dL).  COAG Lab Results  Component Value Date   INR 1.0 06/06/2019   Radiology Ct Angio Head W Or Wo Contrast  Result Date: 06/06/2019 CLINICAL DATA:  Numbness of the left arm upon wakening  today. EXAM: CT ANGIOGRAPHY HEAD AND NECK TECHNIQUE: Multidetector CT imaging of the head and neck was performed using the standard protocol during bolus administration of intravenous contrast. Multiplanar CT image reconstructions and MIPs were obtained to evaluate the vascular anatomy. Carotid stenosis measurements (when applicable) are obtained utilizing NASCET criteria, using the distal internal carotid diameter as the denominator. CONTRAST:  66mL OMNIPAQUE IOHEXOL 350 MG/ML SOLN COMPARISON:  Head CT same day FINDINGS: CTA NECK FINDINGS Aortic arch: Aortic atherosclerosis.  No aneurysm or dissection. Right carotid system: Common carotid artery is patent to the bifurcation. There is advanced soft and calcified plaque at the carotid bifurcation and ICA bulb with near occlusion in the proximal bulb. Antegrade flow persists but minimal diameter is less than 1 mm. Cervical ICA is small because of flow reduction. Left carotid system: Common carotid artery is patent to the bifurcation region. Extensive soft and calcified plaque at the carotid bifurcation and ICA bulb. Minimal diameter in the ICA bulb is 1.5 mm. Caliber of the more distal ICA is reduced likely due to flow reduction. Question serial stenoses in the more distal cervical ICA. Stenosis at the ICA bulb is estimated at 70% or greater. Difficult to apply NASCET criteria with distal flow reduction. Vertebral arteries: Vertebral artery origins are patent. The vertebral arteries are small vessels that are patent through the cervical region to the foramen magnum. Incidental note of a 40-50% stenosis of the left subclavian artery at the apex. Skeleton: Degenerative cervical spondylosis. Other neck: No mass or lymphadenopathy. Upper chest: Normal Review of the MIP images confirms the above findings CTA HEAD FINDINGS Anterior circulation: Both internal carotid arteries are patent through the skull base and siphon regions. There is severe atherosclerotic disease in the  carotid siphon regions with narrowing. Both vessels appear patent through that area but are very small caliber. The anterior and middle cerebral vessels are patent but also are very small caliber. I cannot identify a proximal embolic occlusion that could be treatable with mechanical intervention. Posterior circulation: Both vertebral arteries are patent through the foramen magnum to the basilar. No basilar stenosis. Posterior circulation branch vessels show flow. Venous sinuses: Patent and normal. Anatomic variants: None significant.  Delayed phase: Not performed. Review of the MIP images confirms the above findings IMPRESSION: Severe atherosclerotic disease at both carotid bifurcation and ICA bulbs, right worse than left. Luminal diameter on the right is 1 mm or less in the ICA bulb. Luminal diameter on the left is 1.5 mm in the ICA bulb. There is flow reduction in the diameter of both distal cervical internal carotid arteries, making application of NASCET criteria difficult. Stenosis is estimated at 80% or worse on the right and 70% or worse on the left. No large or medium vessel intracranial occlusion identified. Severe atherosclerotic disease in both carotid siphon regions with narrowing of both internal carotid arteries through that segment. These results were called by telephone at the time of interpretation on 06/06/2019 at 8:45 am to Dr. Carrie Mew , who verbally acknowledged these results. Electronically Signed   By: Nelson Chimes M.D.   On: 06/06/2019 08:54   Ct Head Wo Contrast  Result Date: 06/06/2019 CLINICAL DATA:  Left arm numbness upon awakening EXAM: CT HEAD WITHOUT CONTRAST TECHNIQUE: Contiguous axial images were obtained from the base of the skull through the vertex without intravenous contrast. COMPARISON:  None. FINDINGS: Brain: Cytotoxic edema in the posterior right frontal and low right parietal regions, acute appearing. No hemorrhage, hydrocephalus, or masslike finding. No pre-existing  infarct is seen. Vascular: No hyperdense vessel or unexpected calcification. Skull: Normal. Negative for fracture or focal lesion. Sinuses/Orbits: No acute finding. IMPRESSION: Moderate acute appearing infarct in the right posterior frontal and parietal lobes. Electronically Signed   By: Monte Fantasia M.D.   On: 06/06/2019 07:03   Ct Angio Neck W And/or Wo Contrast  Result Date: 06/06/2019 CLINICAL DATA:  Numbness of the left arm upon wakening today. EXAM: CT ANGIOGRAPHY HEAD AND NECK TECHNIQUE: Multidetector CT imaging of the head and neck was performed using the standard protocol during bolus administration of intravenous contrast. Multiplanar CT image reconstructions and MIPs were obtained to evaluate the vascular anatomy. Carotid stenosis measurements (when applicable) are obtained utilizing NASCET criteria, using the distal internal carotid diameter as the denominator. CONTRAST:  8mL OMNIPAQUE IOHEXOL 350 MG/ML SOLN COMPARISON:  Head CT same day FINDINGS: CTA NECK FINDINGS Aortic arch: Aortic atherosclerosis.  No aneurysm or dissection. Right carotid system: Common carotid artery is patent to the bifurcation. There is advanced soft and calcified plaque at the carotid bifurcation and ICA bulb with near occlusion in the proximal bulb. Antegrade flow persists but minimal diameter is less than 1 mm. Cervical ICA is small because of flow reduction. Left carotid system: Common carotid artery is patent to the bifurcation region. Extensive soft and calcified plaque at the carotid bifurcation and ICA bulb. Minimal diameter in the ICA bulb is 1.5 mm. Caliber of the more distal ICA is reduced likely due to flow reduction. Question serial stenoses in the more distal cervical ICA. Stenosis at the ICA bulb is estimated at 70% or greater. Difficult to apply NASCET criteria with distal flow reduction. Vertebral arteries: Vertebral artery origins are patent. The vertebral arteries are small vessels that are patent  through the cervical region to the foramen magnum. Incidental note of a 40-50% stenosis of the left subclavian artery at the apex. Skeleton: Degenerative cervical spondylosis. Other neck: No mass or lymphadenopathy. Upper chest: Normal Review of the MIP images confirms the above findings CTA HEAD FINDINGS Anterior circulation: Both internal carotid arteries are patent through the skull base and siphon regions. There is severe atherosclerotic disease in the carotid siphon regions  with narrowing. Both vessels appear patent through that area but are very small caliber. The anterior and middle cerebral vessels are patent but also are very small caliber. I cannot identify a proximal embolic occlusion that could be treatable with mechanical intervention. Posterior circulation: Both vertebral arteries are patent through the foramen magnum to the basilar. No basilar stenosis. Posterior circulation branch vessels show flow. Venous sinuses: Patent and normal. Anatomic variants: None significant. Delayed phase: Not performed. Review of the MIP images confirms the above findings IMPRESSION: Severe atherosclerotic disease at both carotid bifurcation and ICA bulbs, right worse than left. Luminal diameter on the right is 1 mm or less in the ICA bulb. Luminal diameter on the left is 1.5 mm in the ICA bulb. There is flow reduction in the diameter of both distal cervical internal carotid arteries, making application of NASCET criteria difficult. Stenosis is estimated at 80% or worse on the right and 70% or worse on the left. No large or medium vessel intracranial occlusion identified. Severe atherosclerotic disease in both carotid siphon regions with narrowing of both internal carotid arteries through that segment. These results were called by telephone at the time of interpretation on 06/06/2019 at 8:45 am to Dr. Carrie Mew , who verbally acknowledged these results. Electronically Signed   By: Nelson Chimes M.D.   On: 06/06/2019  08:54   Assessment/Plan The patient is a 68 year old female with no known medical history at the time of arrival to the [facility] emergency department with a chief complaint of left upper/lower sided weakness. 1. Carotid Stenosis: Patient with severe carotid stenosis bilaterally.  Right internal carotid artery greater than left internal carotid artery.  Patient will most likely need repair to both sides however in the setting of an acute stroke would recommend waiting at least 2 to 3 weeks.  Agree with initiation of aspirin.  Would also initiate Plavix.  We will review the CTA with Dr. Delana Meyer to assess her anatomy and overall health status to ascertain if open endarterectomy versus stenting is appropriate. 2. Tobacco Abuse: We had a discussion for approximately three minutes regarding the absolute need for smoking cessation due to the deleterious nature of tobacco on the vascular system. We discussed the tobacco use would diminish patency of any intervention, and likely significantly worsen progressio of disease. We discussed multiple agents for quitting including replacement therapy or medications to reduce cravings such as Chantix. The patient voices their understanding of the importance of smoking cessation. 3. Hypertension: On appropriate medications. Encouraged good control as its slows the progression of atherosclerotic disease. 4. Hyperlipidemia: Agree with initiation of aspirin and statin for medical management. Encouraged good control as its slows the progression of atherosclerotic disease.  Discussed with Dr. Francene Castle, PA-C  06/06/2019 3:51 PM  This note was created with Dragon medical transcription system.  Any error is purely unintentional

## 2019-06-06 NOTE — ED Notes (Signed)
Pt called out to use BR. Pt unhooked from monitor, got up and fell while I was placing urine collector in toilet. Pt with bleeding top lip. No other complaints. MD notified.

## 2019-06-06 NOTE — Progress Notes (Signed)
Advance care planning  Purpose of Encounter Acute CVA  Parties in Attendance Patient  Patients Decisional capacity Alert and oriented.  Able to make medical decisions.  No documented healthcare power of attorney or ACP documents in place.  She tells me her husband would make decisions if she is unable to at any point.  Discussed in detail regarding acute CVA.  Treatment plan , prognosis discussed.  All questions answered  Patient would like aggressive medical management with CPR/defibrillation/intubation if necessary.  Orders entered and CODE STATUS changed  FULL CODE  Time spent - 17 minutes

## 2019-06-06 NOTE — Care Management (Signed)
EDRNCM met with patient to provide [place] letter. She states that she is independent from home with her husband.

## 2019-06-06 NOTE — ED Notes (Signed)
Patient transported to CT 

## 2019-06-06 NOTE — Consult Note (Signed)
[facility]   TeleStroke Metrics: LKW: [id number] Door Time: 0622  [facility] Contacted: [phone]  [facility] at Bedside: 0734 NIHSS: 0740 Decision on Alteplase: Not to give as her last known well time is greater than 4.5 hours prior to her presentation. Interventional Candidate: Not a candidate as her symptoms are not consistent with a large vessel proximal occlusion.   Chief Complaint: Left-sided numbness and weakness   HPI: Asked to see this patient in emergent telemedicine consultation utilizing interactive audio and video technologies. Consultation was performed with assistance of ancillary / medical staff at bedside. Verbal consent to perform the examination with telemedicine was obtained. Patient agreed to proceed with the consultation for acute stroke protocol.  68 year old right-handed African-American female who was brought to the emergency room by family for left-sided weakness and numbness.  Patient denies any past medical history.  She does not take any blood thinners or aspirin.  She does not take any medications in general.  She is an active smoker.  Patient states around 10 PM prior to going to bed, she had acute left arm numbness.  Then she woke up at 5:30 AM this morning and noted persistent left arm numbness and now a heaviness to her left arm.  She also had some left leg weakness as well.  She decided to come to the emergency room for further evaluation this morning.  Head CT was positive for multifocal right MCA strokes.  Patient was also noted to be hypertensive with systolics in the 284X, and blood glucose is also elevated at 283.   PMH: Denies.   SOC: Positive for tobacco abuse.  Negative x2.  Patient married.   [place]: Negative for stroke.   ROS: 13 point review systems were reviewed with the patient, and are all negative with the exception of the aforementioned in the history of present illness.   VS: Systolic blood pressure was  reportedly around 220.  Weight 77.1 kg   Exam: Patient is in no apparent distress.  Patient appears as stated age.  No obvious acute respiratory or cardiac distress.  Patient is well groomed and well-nourished. 1a- LOC: Keenly responsive - 0 1b- LOC questions: Answers both questions correctly - 0 1c- LOC commands- Performs both tasks correctly- 0 2- Gaze: Normal; no gaze paresis or gaze deviation - 0 3- Visual Fields: normal, no Visual field deficit - 0 4- Facial movements: left facial palsy - 1 5- Upper limb motor - left arm drift - 1 6- Lower limb motor - left leg drift - 1 7- Limb Coordination: absent ataxia - 0 8- Sensory: left sensory loss - 1 9- Language - No aphasia - 0 10- Speech - No dysarthria -0 11- Neglect / Extinction - none found - 0 NIHSS score: 4   Diagnostic Data: CT head showed multifocal embolic appearing strokes within the right MCA distribution involving the right frontal lobe and right posterior parietal lobe.  No acute hemorrhage or mass.  WBC 9.6, hemoglobin 16.3, platelets 211, coagulation studies within normal limits, sodium 138, potassium 3.6, BUN 12, creatinine 0.94, blood glucose 283  Medical Data Reviewed: 1.Data?reviewed include clinical labs, radiology,?and medical tests; 2.Tests?results discussed w/performing or interpreting physician; 3.Obtaining/reviewing old medical records; 4.Obtaining?case history from another source; 5.Independent?review of image, tracing, or specimen.   Medical Decision Making: - Extensive number of diagnosis or management options are considered below. - Extensive amount of complex data reviewed. - High risk of complication and/or morbidity or mortality are associated with differential diagnostic considerations  below. - There may be?uncertain?outcome and increased probability of prolonged functional impairment or high probability of severe prolonged functional impairment associated with some of these differential diagnosis.    Differential Diagnosis for Stroke: 1.?Cardioembolic?stroke 2. Small vessel disease/lacune 3. Thromboembolic, artery-to-artery mechanism 4.?Hypercoagulable?state-related infarct 5. Transient ischemic attack 6. Thrombotic mechanism, large artery disease   Assessment: 1.  Acute right MCA strokes, suspect embolic phenomenon 2.  Tobacco abuse 3.  Probable hypertension 4.  Hyperglycemia with possible diabetes mellitus  Recommendations: Check CTA of the head and neck now to better evaluate her intracranial and extracranial blood vessels, with particular attention to the right ICA. Patient can be admitted to the hospital for further work-up of her symptoms. Consult local neurology team to assist with the evaluation and management. Start the patient on full dose aspirin. Okay to maintain systolic blood pressure to be less than 694 and diastolic blood pressure less than 100. Check MRI brain to evaluate for any other areas of acute cerebral ischemia. Check echocardiogram to gauge her cardiac function. Maintain the patient on telemetry to look for paroxysmal atrial fibrillation. Check hemoglobin A1c and lipid panel. Consult PT, OT, and ST. Continue supportive care. Plan of care was cussed with the patient.  Thank you for allowing [facility] to participate in the care of your patient. Please call me, Dr. Dale Dewart, with any questions at [phone]. Case discussed with the ER staff and Dr. Joni Fears.   Critical Care notation:   I was called to see this critical patient emergently. I personally evaluated this critical patient for acute stroke evaluation, and determining their eligibility for IV Alteplase and interventional therapies.  I have spent approximately 10 minutes with the patient, including time at bedside, time discussing the case with other physicians, reviewing plan of care, and time independently reviewing the records and scans.

## 2019-06-06 NOTE — ED Notes (Addendum)
ED TO INPATIENT HANDOFF REPORT  ED Nurse Name Sam and Phone #: [phone]  S Name/Age/Gender Tammy Nunez 68 y.o. female Room/Bed: ED10A/ED10A  Code Status   Code Status: Full Code  Home/SNF/Other Home Patient oriented to: self, place, time and situation Is this baseline? Yes   Triage Complete: Triage complete  Chief Complaint Left arm Numbness  Triage Note Patient with complaint of numbness to her left arm when she woke up this am. Patient states that she went to bed at 22:00 last night.    Allergies No Known Allergies  Level of Care/Admitting Diagnosis ED Disposition    ED Disposition Condition Comment   Admit  Hospital Area: [place] [[id number]]  Level of Care: Med-Surg [16]  Covid Evaluation: N/A  Diagnosis: Acute CVA (cerebrovascular accident) [facility]) [[id number]]  Admitting Physician: Hillary Bow [[id number]]  Attending Physician: Hillary Bow [[id number]]  PT Class (Do Not Modify): Observation [104]  PT Acc Code (Do Not Modify): Observation [10022]       B Medical/Surgery History History reviewed. No pertinent past medical history. Past Surgical History:  Procedure Laterality Date  . ECTOPIC PREGNANCY SURGERY       A IV Location/Drains/Wounds Patient Lines/Drains/Airways Status   Active Line/Drains/Airways    Name:   Placement date:   Placement time:   Site:   Days:   Peripheral IV 06/06/19 Right Antecubital   06/06/19    0630    Antecubital   less than 1          Intake/Output Last 24 hours No intake or output data in the 24 hours ending 06/06/19 1105  Labs/Imaging Results for orders placed or performed during the hospital encounter of 06/06/19 (from the past 48 hour(s))  Protime-INR     Status: None   Collection Time: 06/06/19  6:36 AM  Result Value Ref Range   Prothrombin Time 13.0 11.4 - 15.2 seconds   INR 1.0 0.8 - 1.2    Comment: (NOTE) INR goal varies based on device and disease states. Performed at [facility], [address] [place], [place] [address]   APTT     Status: None   Collection Time: 06/06/19  6:36 AM  Result Value Ref Range   aPTT 31 24 - 36 seconds    Comment: Performed at [facility], [address] [place], [place] [address]  CBC     Status: Abnormal   Collection Time: 06/06/19  6:36 AM  Result Value Ref Range   WBC 9.6 4.0 - 10.5 K/uL   RBC 5.33 (H) 3.87 - 5.11 MIL/uL   Hemoglobin 16.3 (H) 12.0 - 15.0 g/dL   HCT 46.5 (H) 36.0 - 46.0 %   MCV 87.2 80.0 - 100.0 fL   MCH 30.6 26.0 - 34.0 pg   MCHC 35.1 30.0 - 36.0 g/dL   RDW 13.3 11.5 - 15.5 %   Platelets 211 150 - 400 K/uL   nRBC 0.0 0.0 - 0.2 %    Comment: Performed at [facility], [address] [place], [place] [address]  Differential     Status: None   Collection Time: 06/06/19  6:36 AM  Result Value Ref Range   Neutrophils Relative % 60 %   Neutro Abs 5.8 1.7 - 7.7 K/uL   Lymphocytes Relative 33 %   Lymphs Abs 3.1 0.7 - 4.0 K/uL   Monocytes Relative 6 %   Monocytes Absolute 0.6 0.1 - 1.0 K/uL   Eosinophils Relative 0 %  Eosinophils Absolute 0.0 0.0 - 0.5 K/uL   Basophils Relative 1 %   Basophils Absolute 0.1 0.0 - 0.1 K/uL   Immature Granulocytes 0 %   Abs Immature Granulocytes 0.04 0.00 - 0.07 K/uL    Comment: Performed at [facility], [address] [place], [place] [address]  Comprehensive metabolic panel     Status: Abnormal   Collection Time: 06/06/19  6:36 AM  Result Value Ref Range   Sodium 138 135 - 145 mmol/L   Potassium 3.6 3.5 - 5.1 mmol/L    Comment: HEMOLYSIS AT THIS LEVEL MAY AFFECT RESULT   Chloride 102 98 - 111 mmol/L   CO2 20 (L) 22 - 32 mmol/L   Glucose, Bld 283 (H) 70 - 99 mg/dL   BUN 12 8 - 23 mg/dL   Creatinine, Ser 0.94 0.44 - 1.00 mg/dL   Calcium 9.8 8.9 - 10.3 mg/dL   Total Protein 8.1 6.5 - 8.1 g/dL   Albumin 4.7 3.5 - 5.0 g/dL   AST 26 15 - 41 U/L   ALT 22 0 - 44 U/L   Alkaline Phosphatase 86 38 - 126 U/L   Total Bilirubin 0.9 0.3 -  1.2 mg/dL   GFR calc non Af Amer >60 >60 mL/min   GFR calc Af Amer >60 >60 mL/min   Anion gap 16 (H) 5 - 15    Comment: Performed at [facility], [address] [place], [place] [address]  SARS Coronavirus 2 (CEPHEID - Performed in [place] hospital lab), Hosp Order     Status: None   Collection Time: 06/06/19  7:51 AM   Specimen: Nasopharyngeal Swab  Result Value Ref Range   SARS Coronavirus 2 NEGATIVE NEGATIVE    Comment: (NOTE) If result is NEGATIVE SARS-CoV-2 target nucleic acids are NOT DETECTED. The SARS-CoV-2 RNA is generally detectable in upper and lower  respiratory specimens during the acute phase of infection. The lowest  concentration of SARS-CoV-2 viral copies this assay can detect is 250  copies / mL. A negative result does not preclude SARS-CoV-2 infection  and should not be used as the sole basis for treatment or other  patient management decisions.  A negative result may occur with  improper specimen collection / handling, submission of specimen other  than nasopharyngeal swab, presence of viral mutation(s) within the  areas targeted by this assay, and inadequate number of viral copies  (<250 copies / mL). A negative result must be combined with clinical  observations, patient history, and epidemiological information. If result is POSITIVE SARS-CoV-2 target nucleic acids are DETECTED. The SARS-CoV-2 RNA is generally detectable in upper and lower  respiratory specimens dur ing the acute phase of infection.  Positive  results are indicative of active infection with SARS-CoV-2.  Clinical  correlation with patient history and other diagnostic information is  necessary to determine patient infection status.  Positive results do  not rule out bacterial infection or co-infection with other viruses. If result is PRESUMPTIVE POSTIVE SARS-CoV-2 nucleic acids MAY BE PRESENT.   A presumptive positive result was obtained on the submitted specimen  and confirmed  on repeat testing.  While 2019 novel coronavirus  (SARS-CoV-2) nucleic acids may be present in the submitted sample  additional confirmatory testing may be necessary for epidemiological  and / or clinical management purposes  to differentiate between  SARS-CoV-2 and other Sarbecovirus currently known to infect humans.  If clinically indicated additional testing with an alternate test  methodology [phone]) is  advised. The SARS-CoV-2 RNA is generally  detectable in upper and lower respiratory sp ecimens during the acute  phase of infection. The expected result is Negative. Fact Sheet for Patients:  StrictlyIdeas.no Fact Sheet for Healthcare Providers: BankingDealers.co.za This test is not yet approved or cleared by the [place] FDA and has been authorized for detection and/or diagnosis of SARS-CoV-2 by FDA under an Emergency Use Authorization (EUA).  This EUA will remain in effect (meaning this test can be used) for the duration of the COVID-19 declaration under Section 564(b)(1) of the Act, 21 U.S.C. section 360bbb-3(b)(1), unless the authorization is terminated or revoked sooner. Performed at [facility], [place], [place] [address]    Ct Angio Head W Or Wo Contrast  Result Date: 06/06/2019 CLINICAL DATA:  Numbness of the left arm upon wakening today. EXAM: CT ANGIOGRAPHY HEAD AND NECK TECHNIQUE: Multidetector CT imaging of the head and neck was performed using the standard protocol during bolus administration of intravenous contrast. Multiplanar CT image reconstructions and MIPs were obtained to evaluate the vascular anatomy. Carotid stenosis measurements (when applicable) are obtained utilizing NASCET criteria, using the distal internal carotid diameter as the denominator. CONTRAST:  50mL OMNIPAQUE IOHEXOL 350 MG/ML SOLN COMPARISON:  Head CT same day FINDINGS: CTA NECK FINDINGS Aortic arch: Aortic  atherosclerosis.  No aneurysm or dissection. Right carotid system: Common carotid artery is patent to the bifurcation. There is advanced soft and calcified plaque at the carotid bifurcation and ICA bulb with near occlusion in the proximal bulb. Antegrade flow persists but minimal diameter is less than 1 mm. Cervical ICA is small because of flow reduction. Left carotid system: Common carotid artery is patent to the bifurcation region. Extensive soft and calcified plaque at the carotid bifurcation and ICA bulb. Minimal diameter in the ICA bulb is 1.5 mm. Caliber of the more distal ICA is reduced likely due to flow reduction. Question serial stenoses in the more distal cervical ICA. Stenosis at the ICA bulb is estimated at 70% or greater. Difficult to apply NASCET criteria with distal flow reduction. Vertebral arteries: Vertebral artery origins are patent. The vertebral arteries are small vessels that are patent through the cervical region to the foramen magnum. Incidental note of a 40-50% stenosis of the left subclavian artery at the apex. Skeleton: Degenerative cervical spondylosis. Other neck: No mass or lymphadenopathy. Upper chest: Normal Review of the MIP images confirms the above findings CTA HEAD FINDINGS Anterior circulation: Both internal carotid arteries are patent through the skull base and siphon regions. There is severe atherosclerotic disease in the carotid siphon regions with narrowing. Both vessels appear patent through that area but are very small caliber. The anterior and middle cerebral vessels are patent but also are very small caliber. I cannot identify a proximal embolic occlusion that could be treatable with mechanical intervention. Posterior circulation: Both vertebral arteries are patent through the foramen magnum to the basilar. No basilar stenosis. Posterior circulation branch vessels show flow. Venous sinuses: Patent and normal. Anatomic variants: None significant. Delayed phase: Not  performed. Review of the MIP images confirms the above findings IMPRESSION: Severe atherosclerotic disease at both carotid bifurcation and ICA bulbs, right worse than left. Luminal diameter on the right is 1 mm or less in the ICA bulb. Luminal diameter on the left is 1.5 mm in the ICA bulb. There is flow reduction in the diameter of both distal cervical internal carotid arteries, making application of NASCET criteria difficult. Stenosis is estimated at 80%  or worse on the right and 70% or worse on the left. No large or medium vessel intracranial occlusion identified. Severe atherosclerotic disease in both carotid siphon regions with narrowing of both internal carotid arteries through that segment. These results were called by telephone at the time of interpretation on 06/06/2019 at 8:45 am to Dr. Carrie Mew , who verbally acknowledged these results. Electronically Signed   By: Nelson Chimes M.D.   On: 06/06/2019 08:54   Ct Head Wo Contrast  Result Date: 06/06/2019 CLINICAL DATA:  Left arm numbness upon awakening EXAM: CT HEAD WITHOUT CONTRAST TECHNIQUE: Contiguous axial images were obtained from the base of the skull through the vertex without intravenous contrast. COMPARISON:  None. FINDINGS: Brain: Cytotoxic edema in the posterior right frontal and low right parietal regions, acute appearing. No hemorrhage, hydrocephalus, or masslike finding. No pre-existing infarct is seen. Vascular: No hyperdense vessel or unexpected calcification. Skull: Normal. Negative for fracture or focal lesion. Sinuses/Orbits: No acute finding. IMPRESSION: Moderate acute appearing infarct in the right posterior frontal and parietal lobes. Electronically Signed   By: Monte Fantasia M.D.   On: 06/06/2019 07:03   Ct Angio Neck W And/or Wo Contrast  Result Date: 06/06/2019 CLINICAL DATA:  Numbness of the left arm upon wakening today. EXAM: CT ANGIOGRAPHY HEAD AND NECK TECHNIQUE: Multidetector CT imaging of the head and neck was  performed using the standard protocol during bolus administration of intravenous contrast. Multiplanar CT image reconstructions and MIPs were obtained to evaluate the vascular anatomy. Carotid stenosis measurements (when applicable) are obtained utilizing NASCET criteria, using the distal internal carotid diameter as the denominator. CONTRAST:  40mL OMNIPAQUE IOHEXOL 350 MG/ML SOLN COMPARISON:  Head CT same day FINDINGS: CTA NECK FINDINGS Aortic arch: Aortic atherosclerosis.  No aneurysm or dissection. Right carotid system: Common carotid artery is patent to the bifurcation. There is advanced soft and calcified plaque at the carotid bifurcation and ICA bulb with near occlusion in the proximal bulb. Antegrade flow persists but minimal diameter is less than 1 mm. Cervical ICA is small because of flow reduction. Left carotid system: Common carotid artery is patent to the bifurcation region. Extensive soft and calcified plaque at the carotid bifurcation and ICA bulb. Minimal diameter in the ICA bulb is 1.5 mm. Caliber of the more distal ICA is reduced likely due to flow reduction. Question serial stenoses in the more distal cervical ICA. Stenosis at the ICA bulb is estimated at 70% or greater. Difficult to apply NASCET criteria with distal flow reduction. Vertebral arteries: Vertebral artery origins are patent. The vertebral arteries are small vessels that are patent through the cervical region to the foramen magnum. Incidental note of a 40-50% stenosis of the left subclavian artery at the apex. Skeleton: Degenerative cervical spondylosis. Other neck: No mass or lymphadenopathy. Upper chest: Normal Review of the MIP images confirms the above findings CTA HEAD FINDINGS Anterior circulation: Both internal carotid arteries are patent through the skull base and siphon regions. There is severe atherosclerotic disease in the carotid siphon regions with narrowing. Both vessels appear patent through that area but are very small  caliber. The anterior and middle cerebral vessels are patent but also are very small caliber. I cannot identify a proximal embolic occlusion that could be treatable with mechanical intervention. Posterior circulation: Both vertebral arteries are patent through the foramen magnum to the basilar. No basilar stenosis. Posterior circulation branch vessels show flow. Venous sinuses: Patent and normal. Anatomic variants: None significant. Delayed phase: Not performed. Review  of the MIP images confirms the above findings IMPRESSION: Severe atherosclerotic disease at both carotid bifurcation and ICA bulbs, right worse than left. Luminal diameter on the right is 1 mm or less in the ICA bulb. Luminal diameter on the left is 1.5 mm in the ICA bulb. There is flow reduction in the diameter of both distal cervical internal carotid arteries, making application of NASCET criteria difficult. Stenosis is estimated at 80% or worse on the right and 70% or worse on the left. No large or medium vessel intracranial occlusion identified. Severe atherosclerotic disease in both carotid siphon regions with narrowing of both internal carotid arteries through that segment. These results were called by telephone at the time of interpretation on 06/06/2019 at 8:45 am to Dr. Carrie Mew , who verbally acknowledged these results. Electronically Signed   By: Nelson Chimes M.D.   On: 06/06/2019 08:54    Pending Labs Unresulted Labs (From admission, onward)    Start     Ordered   06/13/19 0500  Creatinine, serum  (enoxaparin (LOVENOX)    CrCl >/= 30 ml/min)  Weekly,   STAT    Comments: while on enoxaparin therapy    06/06/19 1001   06/06/19 1000  Hemoglobin A1c  Add-on,   AD     06/06/19 1001   06/06/19 1000  Lipid panel  Add-on,   AD    Comments: Fasting    06/06/19 1001   06/06/19 0959  HIV antibody (Routine Testing)  Add-on,   AD     06/06/19 1001   06/06/19 0830  Urine Drug Screen, Qualitative ([facility] only)  Once,   R      06/06/19 0830   06/06/19 9518  Urinalysis, Routine w reflex microscopic  ONCE - STAT,   STAT     06/06/19 8416          Vitals/Pain Today's Vitals   06/06/19 0631  Weight: 77.1 kg  Height: 4\' 4"  (1.321 m)  PainSc: 0-No pain    Isolation Precautions No active isolations  Medications Medications   stroke: mapping our early stages of recovery book (has no administration in time range)  acetaminophen (TYLENOL) tablet 650 mg (has no administration in time range)    Or  acetaminophen (TYLENOL) solution 650 mg (has no administration in time range)    Or  acetaminophen (TYLENOL) suppository 650 mg (has no administration in time range)  senna-docusate (Senokot-S) tablet 1 tablet (has no administration in time range)  enoxaparin (LOVENOX) injection 40 mg (has no administration in time range)  aspirin suppository 300 mg (has no administration in time range)    Or  aspirin tablet 325 mg (has no administration in time range)  ondansetron (ZOFRAN) injection 4 mg (has no administration in time range)  hydrALAZINE (APRESOLINE) injection 10 mg (has no administration in time range)  lidocaine-EPINEPHrine (XYLOCAINE W/EPI) 2 %-1:200000 (PF) injection 20 mL (has no administration in time range)  aspirin chewable tablet 324 mg (324 mg Oral Given 06/06/19 0811)  iohexol (OMNIPAQUE) 350 MG/ML injection 75 mL (75 mLs Intravenous Contrast Given 06/06/19 0818)    Mobility walks Low fall risk   Focused Assessments Neuro Assessment Handoff:  Swallow screen pass? Yes    NIH Stroke Scale ( + Modified Stroke Scale Criteria)  Interval: Shift assessment Level of Consciousness (1a.)   : Alert, keenly responsive LOC Questions (1b. )   +: Answers both questions correctly LOC Commands (1c. )   + : Performs both tasks correctly Best  Gaze (2. )  +: Normal Visual (3. )  +: No visual loss Facial Palsy (4. )    : Minor paralysis Motor Arm, Left (5a. )   +: No drift Motor Arm, Right (5b. )   +: No  drift Motor Leg, Left (6a. )   +: Drift Motor Leg, Right (6b. )   +: No drift Limb Ataxia (7. ): Absent Sensory (8. )   +: Mild-to-moderate sensory loss, patient feels pinprick is less sharp or is dull on the affected side, or there is a loss of superficial pain with pinprick, but patient is aware of being touched Best Language (9. )   +: No aphasia Dysarthria (10. ): Normal Extinction/Inattention (11.)   +: No Abnormality Modified SS Total  +: 2 Complete NIHSS TOTAL: 3 Last date known well: 06/05/19 Last time known well: 2200 Neuro Assessment: Exceptions to [facility] Neuro Checks:   Initial (06/06/19 0647)  Last Documented NIHSS Modified Score: 2 (06/06/19 0734) Has TPA been given? No If patient is a Neuro Trauma and patient is going to OR before floor call report to [place] nurse: [phone] or [phone]     R Recommendations: See Admitting Provider Note  Report given to:   Additional Notes:  Pt needs assistance when ambulating. Pt has weakness noted to left side.

## 2019-06-07 ENCOUNTER — Observation Stay (HOSPITAL_COMMUNITY)
Admit: 2019-06-07 | Discharge: 2019-06-07 | Disposition: A | Discharge status: Home / Self Care | Payer: PPO | Attending: Internal Medicine | Admitting: Internal Medicine

## 2019-06-07 ENCOUNTER — Telehealth (INDEPENDENT_AMBULATORY_CARE_PROVIDER_SITE_OTHER): Payer: Self-pay

## 2019-06-07 DIAGNOSIS — I6523 Occlusion and stenosis of bilateral carotid arteries: Secondary | ICD-10-CM | POA: Diagnosis present

## 2019-06-07 DIAGNOSIS — I639 Cerebral infarction, unspecified: Secondary | ICD-10-CM

## 2019-06-07 DIAGNOSIS — I63511 Cerebral infarction due to unspecified occlusion or stenosis of right middle cerebral artery: Secondary | ICD-10-CM | POA: Diagnosis present

## 2019-06-07 DIAGNOSIS — S01511A Laceration without foreign body of lip, initial encounter: Secondary | ICD-10-CM | POA: Diagnosis present

## 2019-06-07 DIAGNOSIS — Z8249 Family history of ischemic heart disease and other diseases of the circulatory system: Secondary | ICD-10-CM | POA: Diagnosis not present

## 2019-06-07 DIAGNOSIS — I63233 Cerebral infarction due to unspecified occlusion or stenosis of bilateral carotid arteries: Secondary | ICD-10-CM | POA: Diagnosis not present

## 2019-06-07 DIAGNOSIS — Z1159 Encounter for screening for other viral diseases: Secondary | ICD-10-CM | POA: Diagnosis not present

## 2019-06-07 DIAGNOSIS — G8194 Hemiplegia, unspecified affecting left nondominant side: Secondary | ICD-10-CM | POA: Diagnosis present

## 2019-06-07 DIAGNOSIS — R569 Unspecified convulsions: Secondary | ICD-10-CM | POA: Diagnosis not present

## 2019-06-07 DIAGNOSIS — G4089 Other seizures: Secondary | ICD-10-CM | POA: Diagnosis present

## 2019-06-07 DIAGNOSIS — E1165 Type 2 diabetes mellitus with hyperglycemia: Secondary | ICD-10-CM | POA: Diagnosis present

## 2019-06-07 DIAGNOSIS — Z833 Family history of diabetes mellitus: Secondary | ICD-10-CM | POA: Diagnosis not present

## 2019-06-07 DIAGNOSIS — Y9389 Activity, other specified: Secondary | ICD-10-CM | POA: Diagnosis not present

## 2019-06-07 DIAGNOSIS — I63411 Cerebral infarction due to embolism of right middle cerebral artery: Secondary | ICD-10-CM | POA: Diagnosis present

## 2019-06-07 DIAGNOSIS — F17210 Nicotine dependence, cigarettes, uncomplicated: Secondary | ICD-10-CM | POA: Diagnosis present

## 2019-06-07 DIAGNOSIS — R270 Ataxia, unspecified: Secondary | ICD-10-CM | POA: Diagnosis present

## 2019-06-07 DIAGNOSIS — R414 Neurologic neglect syndrome: Secondary | ICD-10-CM | POA: Diagnosis present

## 2019-06-07 DIAGNOSIS — R29703 NIHSS score 3: Secondary | ICD-10-CM | POA: Diagnosis present

## 2019-06-07 DIAGNOSIS — Z6841 Body Mass Index (BMI) 40.0 and over, adult: Secondary | ICD-10-CM | POA: Diagnosis not present

## 2019-06-07 DIAGNOSIS — G8929 Other chronic pain: Secondary | ICD-10-CM | POA: Diagnosis present

## 2019-06-07 DIAGNOSIS — Z825 Family history of asthma and other chronic lower respiratory diseases: Secondary | ICD-10-CM | POA: Diagnosis not present

## 2019-06-07 DIAGNOSIS — I63239 Cerebral infarction due to unspecified occlusion or stenosis of unspecified carotid arteries: Secondary | ICD-10-CM | POA: Diagnosis not present

## 2019-06-07 DIAGNOSIS — Y92238 Other place in hospital as the place of occurrence of the external cause: Secondary | ICD-10-CM | POA: Diagnosis present

## 2019-06-07 DIAGNOSIS — I10 Essential (primary) hypertension: Secondary | ICD-10-CM | POA: Diagnosis present

## 2019-06-07 DIAGNOSIS — R4189 Other symptoms and signs involving cognitive functions and awareness: Secondary | ICD-10-CM | POA: Diagnosis present

## 2019-06-07 DIAGNOSIS — W1839XA Other fall on same level, initial encounter: Secondary | ICD-10-CM | POA: Diagnosis present

## 2019-06-07 DIAGNOSIS — R1312 Dysphagia, oropharyngeal phase: Secondary | ICD-10-CM | POA: Diagnosis present

## 2019-06-07 DIAGNOSIS — M25512 Pain in left shoulder: Secondary | ICD-10-CM | POA: Diagnosis present

## 2019-06-07 DIAGNOSIS — E785 Hyperlipidemia, unspecified: Secondary | ICD-10-CM | POA: Diagnosis present

## 2019-06-07 LAB — LIPID PANEL
Cholesterol: 305 mg/dL — ABNORMAL HIGH (ref 0–200)
HDL: 39 mg/dL — ABNORMAL LOW (ref >40)
LDL Cholesterol: 217 mg/dL — ABNORMAL HIGH (ref 0–99)
Total CHOL/HDL Ratio: 7.8 RATIO
Triglycerides: 244 mg/dL — ABNORMAL HIGH (ref <150)
VLDL: 49 mg/dL — ABNORMAL HIGH (ref 0–40)

## 2019-06-07 LAB — GLUCOSE, CAPILLARY
Glucose-Capillary: 236 mg/dL — ABNORMAL HIGH (ref 70–99)
Glucose-Capillary: 208 mg/dL — ABNORMAL HIGH (ref 70–99)
Glucose-Capillary: 153 mg/dL — ABNORMAL HIGH (ref 70–99)
Glucose-Capillary: 193 mg/dL — ABNORMAL HIGH (ref 70–99)

## 2019-06-07 LAB — HEMOGLOBIN A1C
Hgb A1c MFr Bld: 9.3 % — ABNORMAL HIGH (ref 4.8–5.6)
Mean Plasma Glucose: 220.21 mg/dL

## 2019-06-07 LAB — ECHOCARDIOGRAM COMPLETE
Height: 52 in
Weight: 2720 oz

## 2019-06-07 MED ORDER — LORAZEPAM 2 MG/ML IJ SOLN
1.0000 mg | Freq: Once | INTRAMUSCULAR | Status: AC
  Administered 2019-06-07: 1 mg via INTRAVENOUS
  Filled 2019-06-07: qty 1

## 2019-06-07 MED ORDER — LEVETIRACETAM IN NACL 1000 MG/100ML IV SOLN
1000.0000 mg | Freq: Once | INTRAVENOUS | Status: AC
  Administered 2019-06-07: 1000 mg via INTRAVENOUS
  Filled 2019-06-07: qty 100

## 2019-06-07 MED ORDER — CLOPIDOGREL BISULFATE 75 MG PO TABS
75.0000 mg | ORAL_TABLET | Freq: Every day | ORAL | Status: DC
  Administered 2019-06-07 – 2019-06-09 (×3): 75 mg via ORAL
  Filled 2019-06-07 (×3): qty 1

## 2019-06-07 MED ORDER — ASPIRIN EC 81 MG PO TBEC
81.0000 mg | DELAYED_RELEASE_TABLET | Freq: Every day | ORAL | Status: DC
  Administered 2019-06-07 – 2019-06-09 (×3): 81 mg via ORAL
  Filled 2019-06-07 (×3): qty 1

## 2019-06-07 MED ORDER — INSULIN GLARGINE 100 UNIT/ML ~~LOC~~ SOLN
8.0000 [IU] | Freq: Every day | SUBCUTANEOUS | Status: DC
  Administered 2019-06-07 – 2019-06-09 (×3): 8 [IU] via SUBCUTANEOUS
  Filled 2019-06-07 (×3): qty 0.08

## 2019-06-07 MED ORDER — LEVETIRACETAM 500 MG PO TABS
500.0000 mg | ORAL_TABLET | Freq: Two times a day (BID) | ORAL | Status: DC
  Administered 2019-06-07 – 2019-06-08 (×2): 500 mg via ORAL
  Filled 2019-06-07 (×3): qty 1

## 2019-06-07 NOTE — Progress Notes (Signed)
*  PRELIMINARY RESULTS* Echocardiogram 2D Echocardiogram has been performed.  Tammy Nunez 06/07/2019, 10:16 AM

## 2019-06-07 NOTE — Progress Notes (Signed)
Inpatient Rehabilitation Admissions Coordinator  Inpatient rehab consult received. I have reviewed patient's chart via Epic. Noted therapy eval limited due to patient lethargy with recent administration of Ativan. For clearer assessment of pt capability, I will await additional therapy assessments, before beginning to pursue a possible inpt rehab admission with [employer], patient and spouse. I have discussed with SW, Mel Almond. I will follow up tomorrow.  Danne Baxter, RN, MSN Rehab Admissions Coordinator [phone] 06/07/2019 2:25 PM

## 2019-06-07 NOTE — Progress Notes (Addendum)
[facility] NAME: Tammy Nunez    MR#:  [id number]  DATE OF BIRTH:  [birth year]  SUBJECTIVE:  CHIEF COMPLAINT:   Chief Complaint  Patient presents with  . Numbness   Left weakness.  Afebrile.  REVIEW OF SYSTEMS:    Review of Systems  Constitutional: Positive for malaise/fatigue. Negative for chills, fever and weight loss.  HENT: Negative for hearing loss and nosebleeds.   Eyes: Negative for blurred vision, double vision and pain.  Respiratory: Negative for cough, hemoptysis, sputum production, shortness of breath and wheezing.   Cardiovascular: Negative for chest pain, palpitations, orthopnea and leg swelling.  Gastrointestinal: Negative for abdominal pain, constipation, diarrhea, nausea and vomiting.  Genitourinary: Negative for dysuria and hematuria.  Musculoskeletal: Negative for back pain, falls and myalgias.  Skin: Negative for rash.  Neurological: Positive for focal weakness. Negative for dizziness, tremors, sensory change, speech change, seizures and headaches.  Endo/Heme/Allergies: Does not bruise/bleed easily.  Psychiatric/Behavioral: Negative for depression and memory loss. The patient is not nervous/anxious.     DRUG ALLERGIES:  No Known Allergies  VITALS:  Blood pressure (!) 160/65, pulse 92, temperature 99.1 F (37.3 C), temperature source Oral, resp. rate 18, height 4\' 4"  (1.321 m), weight 77.1 kg, SpO2 99 %.  PHYSICAL EXAMINATION:   Physical Exam  GENERAL:  68 y.o.-year-old patient lying in the bed with no acute distress.  EYES: Pupils equal, round, reactive to light and accommodation. No scleral icterus. Extraocular muscles intact.  HEENT: Head atraumatic, normocephalic. Oropharynx and nasopharynx clear.  NECK:  Supple, no jugular venous distention. No thyroid enlargement, no tenderness.  LUNGS: Normal breath sounds bilaterally, no wheezing, rales, rhonchi. No use of accessory muscles of respiration.   CARDIOVASCULAR: S1, S2 normal. No murmurs, rubs, or gallops.  ABDOMEN: Soft, nontender, nondistended. Bowel sounds present. No organomegaly or mass.  EXTREMITIES: No cyanosis, clubbing or edema b/l.    NEUROLOGIC: Cranial nerves II through XII are intact. Left weakness PSYCHIATRIC: The patient is alert and oriented x 3.  SKIN: No obvious rash, lesion, or ulcer.   LABORATORY PANEL:   CBC Recent Labs  Lab 06/06/19 0636  WBC 9.6  HGB 16.3*  HCT 46.5*  PLT 211   ------------------------------------------------------------------------------------------------------------------ Chemistries  Recent Labs  Lab 06/06/19 0636  NA 138  K 3.6  CL 102  CO2 20*  GLUCOSE 283*  BUN 12  CREATININE 0.94  CALCIUM 9.8  AST 26  ALT 22  ALKPHOS 86  BILITOT 0.9   ------------------------------------------------------------------------------------------------------------------  Cardiac Enzymes No results for input(s): TROPONINI in the last 168 hours. ------------------------------------------------------------------------------------------------------------------  RADIOLOGY:  Ct Angio Head W Or Wo Contrast  Result Date: 06/06/2019 CLINICAL DATA:  Numbness of the left arm upon wakening today. EXAM: CT ANGIOGRAPHY HEAD AND NECK TECHNIQUE: Multidetector CT imaging of the head and neck was performed using the standard protocol during bolus administration of intravenous contrast. Multiplanar CT image reconstructions and MIPs were obtained to evaluate the vascular anatomy. Carotid stenosis measurements (when applicable) are obtained utilizing NASCET criteria, using the distal internal carotid diameter as the denominator. CONTRAST:  76mL OMNIPAQUE IOHEXOL 350 MG/ML SOLN COMPARISON:  Head CT same day FINDINGS: CTA NECK FINDINGS Aortic arch: Aortic atherosclerosis.  No aneurysm or dissection. Right carotid system: Common carotid artery is patent to the bifurcation. There is advanced soft and calcified  plaque at the carotid bifurcation and ICA bulb with near occlusion in the proximal bulb. Antegrade flow persists but minimal diameter is  less than 1 mm. Cervical ICA is small because of flow reduction. Left carotid system: Common carotid artery is patent to the bifurcation region. Extensive soft and calcified plaque at the carotid bifurcation and ICA bulb. Minimal diameter in the ICA bulb is 1.5 mm. Caliber of the more distal ICA is reduced likely due to flow reduction. Question serial stenoses in the more distal cervical ICA. Stenosis at the ICA bulb is estimated at 70% or greater. Difficult to apply NASCET criteria with distal flow reduction. Vertebral arteries: Vertebral artery origins are patent. The vertebral arteries are small vessels that are patent through the cervical region to the foramen magnum. Incidental note of a 40-50% stenosis of the left subclavian artery at the apex. Skeleton: Degenerative cervical spondylosis. Other neck: No mass or lymphadenopathy. Upper chest: Normal Review of the MIP images confirms the above findings CTA HEAD FINDINGS Anterior circulation: Both internal carotid arteries are patent through the skull base and siphon regions. There is severe atherosclerotic disease in the carotid siphon regions with narrowing. Both vessels appear patent through that area but are very small caliber. The anterior and middle cerebral vessels are patent but also are very small caliber. I cannot identify a proximal embolic occlusion that could be treatable with mechanical intervention. Posterior circulation: Both vertebral arteries are patent through the foramen magnum to the basilar. No basilar stenosis. Posterior circulation branch vessels show flow. Venous sinuses: Patent and normal. Anatomic variants: None significant. Delayed phase: Not performed. Review of the MIP images confirms the above findings IMPRESSION: Severe atherosclerotic disease at both carotid bifurcation and ICA bulbs, right worse  than left. Luminal diameter on the right is 1 mm or less in the ICA bulb. Luminal diameter on the left is 1.5 mm in the ICA bulb. There is flow reduction in the diameter of both distal cervical internal carotid arteries, making application of NASCET criteria difficult. Stenosis is estimated at 80% or worse on the right and 70% or worse on the left. No large or medium vessel intracranial occlusion identified. Severe atherosclerotic disease in both carotid siphon regions with narrowing of both internal carotid arteries through that segment. These results were called by telephone at the time of interpretation on 06/06/2019 at 8:45 am to Dr. Carrie Mew , who verbally acknowledged these results. Electronically Signed   By: Nelson Chimes M.D.   On: 06/06/2019 08:54   Ct Head Wo Contrast  Result Date: 06/06/2019 CLINICAL DATA:  Left arm numbness upon awakening EXAM: CT HEAD WITHOUT CONTRAST TECHNIQUE: Contiguous axial images were obtained from the base of the skull through the vertex without intravenous contrast. COMPARISON:  None. FINDINGS: Brain: Cytotoxic edema in the posterior right frontal and low right parietal regions, acute appearing. No hemorrhage, hydrocephalus, or masslike finding. No pre-existing infarct is seen. Vascular: No hyperdense vessel or unexpected calcification. Skull: Normal. Negative for fracture or focal lesion. Sinuses/Orbits: No acute finding. IMPRESSION: Moderate acute appearing infarct in the right posterior frontal and parietal lobes. Electronically Signed   By: Monte Fantasia M.D.   On: 06/06/2019 07:03   Ct Angio Neck W And/or Wo Contrast  Result Date: 06/06/2019 CLINICAL DATA:  Numbness of the left arm upon wakening today. EXAM: CT ANGIOGRAPHY HEAD AND NECK TECHNIQUE: Multidetector CT imaging of the head and neck was performed using the standard protocol during bolus administration of intravenous contrast. Multiplanar CT image reconstructions and MIPs were obtained to evaluate  the vascular anatomy. Carotid stenosis measurements (when applicable) are obtained utilizing NASCET criteria, using  the distal internal carotid diameter as the denominator. CONTRAST:  38mL OMNIPAQUE IOHEXOL 350 MG/ML SOLN COMPARISON:  Head CT same day FINDINGS: CTA NECK FINDINGS Aortic arch: Aortic atherosclerosis.  No aneurysm or dissection. Right carotid system: Common carotid artery is patent to the bifurcation. There is advanced soft and calcified plaque at the carotid bifurcation and ICA bulb with near occlusion in the proximal bulb. Antegrade flow persists but minimal diameter is less than 1 mm. Cervical ICA is small because of flow reduction. Left carotid system: Common carotid artery is patent to the bifurcation region. Extensive soft and calcified plaque at the carotid bifurcation and ICA bulb. Minimal diameter in the ICA bulb is 1.5 mm. Caliber of the more distal ICA is reduced likely due to flow reduction. Question serial stenoses in the more distal cervical ICA. Stenosis at the ICA bulb is estimated at 70% or greater. Difficult to apply NASCET criteria with distal flow reduction. Vertebral arteries: Vertebral artery origins are patent. The vertebral arteries are small vessels that are patent through the cervical region to the foramen magnum. Incidental note of a 40-50% stenosis of the left subclavian artery at the apex. Skeleton: Degenerative cervical spondylosis. Other neck: No mass or lymphadenopathy. Upper chest: Normal Review of the MIP images confirms the above findings CTA HEAD FINDINGS Anterior circulation: Both internal carotid arteries are patent through the skull base and siphon regions. There is severe atherosclerotic disease in the carotid siphon regions with narrowing. Both vessels appear patent through that area but are very small caliber. The anterior and middle cerebral vessels are patent but also are very small caliber. I cannot identify a proximal embolic occlusion that could be  treatable with mechanical intervention. Posterior circulation: Both vertebral arteries are patent through the foramen magnum to the basilar. No basilar stenosis. Posterior circulation branch vessels show flow. Venous sinuses: Patent and normal. Anatomic variants: None significant. Delayed phase: Not performed. Review of the MIP images confirms the above findings IMPRESSION: Severe atherosclerotic disease at both carotid bifurcation and ICA bulbs, right worse than left. Luminal diameter on the right is 1 mm or less in the ICA bulb. Luminal diameter on the left is 1.5 mm in the ICA bulb. There is flow reduction in the diameter of both distal cervical internal carotid arteries, making application of NASCET criteria difficult. Stenosis is estimated at 80% or worse on the right and 70% or worse on the left. No large or medium vessel intracranial occlusion identified. Severe atherosclerotic disease in both carotid siphon regions with narrowing of both internal carotid arteries through that segment. These results were called by telephone at the time of interpretation on 06/06/2019 at 8:45 am to Dr. Carrie Mew , who verbally acknowledged these results. Electronically Signed   By: Nelson Chimes M.D.   On: 06/06/2019 08:54   Mr Brain Wo Contrast  Result Date: 06/06/2019 CLINICAL DATA:  Left-sided weakness and numbness EXAM: MRI HEAD WITHOUT CONTRAST TECHNIQUE: Multiplanar, multiecho pulse sequences of the brain and surrounding structures were obtained without intravenous contrast. COMPARISON:  CT studies same day FINDINGS: Brain: Diffusion imaging matches well with the CT scan, showing acute cortical and subcortical infarctions in the right temporal parieto-occipital junction region and the right frontoparietal vertex. Few small scattered intervening acute infarctions. No acute infarction on the left. No acute infarction affecting the brainstem or cerebellum. Minimal petechial blood products at the right  temporoparietal occipital junction region but no frank hematoma. No mass effect or shift. Mild chronic small-vessel ischemic change of  the hemispheric white matter elsewhere. Vascular: Major vessels at the base of the brain show flow. Skull and upper cervical spine: Negative Sinuses/Orbits: Clear/normal Other: None IMPRESSION: Embolic infarctions in the right middle cerebral artery territory as above. Minimal petechial blood products in the right posterior temporal occipital parietal junction region without frank hematoma. No mass effect or shift. Background pattern of mild chronic small-vessel ischemic change affecting the hemispheric white matter elsewhere. Electronically Signed   By: Nelson Chimes M.D.   On: 06/06/2019 12:51     ASSESSMENT AND PLAN:   * Acute cortical and subcortical infarctions in the right temporal parieto-occipital junction region and the right frontoparietal vertex Aspirin, Plavix, statin Echocardiogram pending. CTA showed bilateral carotid stenosis PT/OT. Inpatient rehab screening ordered  *Bilateral carotid stenosis.  On aspirin, Plavix, statin.  Vascular surgery has evaluated patient and will follow-up as outpatient for intervention.  *Hypertension.  Started lisinopril.  *Diabetes mellitus.  Hemoglobin A1c of 9.3.  On sliding scale insulin.  DVT prophylaxis with Lovenox  All the records are reviewed and case discussed with Care Management/Social Worker Management plans discussed with the patient, family and they are in agreement.  CODE STATUS: FULL CODE  DVT Prophylaxis: SCDs  TOTAL TIME TAKING CARE OF THIS PATIENT: 35 minutes.   POSSIBLE D/C IN 1-2 DAYS, DEPENDING ON CLINICAL CONDITION.  Neita Carp M.D on 06/07/2019 at 1:42 PM  Between 7am to 6pm - Pager - [phone]  After 6pm go to [email or web address] - password [email or web address] [facility]  Office  [phone]  CC: Primary care physician; Patient, No Pcp Per  Note: This dictation  was prepared with Dragon dictation along with smaller phrase technology. Any transcriptional errors that result from this process are unintentional.

## 2019-06-07 NOTE — Evaluation (Signed)
Clinical/Bedside Swallow Evaluation Patient Details  Name: Tammy Nunez MRN: [id number] Date of Birth: [birth year]  Today's Date: 06/07/2019 Time: SLP Start Time (ACUTE ONLY): 1001 SLP Stop Time (ACUTE ONLY): 1046 SLP Time Calculation (min) (ACUTE ONLY): 45 min  Past Medical History: History reviewed. No pertinent past medical history. Past Surgical History:  Past Surgical History:  Procedure Laterality Date  . ECTOPIC PREGNANCY SURGERY     HPI:  Pt is a 68 yo female w/ current tobacco use presented to the ED on 7/13 with U/L Left sided numbness and weakness. MRI positive for acute embolic infarctions in the R MCA territory and with minimal petechial blood products in the R posterior temporal occipital parietal junction. CT angio reveals severe carotid stenosis estimated at 80% or worse on the right and 70% or worse on the left. Per vascular consult, in setting of acute CVA, recommend waiting at least 2-3 weeks before open endarterectomy vs stenting.  Per NSG this AM, pt noted to have left facial and arm twitching. Per MD, concern is for simple partial status since patient is able to follow commands and speak with this jerking. Likely related to acute infarcts per Neurology note.    Assessment / Plan / Recommendation Clinical Impression  Pt appears to present w/ min oral phase dysphagia which can impact oropharyngeal phase swallow function overall and can increase risk for aspiration from an oropharyngeal phase standpoint. When following general aspiration precautions, this risk for aspiration is reduced. Pt does have neurological deficits impacting Left lingual and facial tone, Left side awarness, and LUE weakness from a new CVA. MRI: "MRI positive for acute embolic infarctions in the R MCA territory and with minimal petechial blood products in the R posterior temporal occipital parietal junction. CT angio reveals severe carotid stenosis estimated at 80% or worse on the right and 70% or worse on  the left".  Pt needed assistance w/ positioning midline, upright - she exhibited a strong lean preference to her Right side. She consumed few trials of thin liquids, purees, and soft solids w/ no immediate, overt s/s of aspiration noted; no decline in vocal quality or respiratory status during/post trials. Oral phase revealed min increased time for mastication and bolus management; given time, she was able to clear orally. Pt fed self w/ Mod. assist. OM exam revealed Mod+ lingual deviation(L); lower labial stitches; no Denture plates currently - Upper at home per pt, Lower is soaking to clean. Pt exhibits difficulty visually tracking to Left side requiring Mod-max cues to turn to Left side - does not maintain attention to Left side. Recommend Dysphagia level 3 (mech soft though w/ Minced meats) diet w/ thin liquids - pt is also not wearing her Upper denture plate(at home per pt); general aspiration precautions. Recommend pills Whole in puree if any difficulty swallowing w/ liquids. Recommend feeding support and monitoring during meals d/t new CVA, decreased Left side awareness.  SLP Visit Diagnosis: Dysphagia, oropharyngeal phase (R13.12)(new CVA)    Aspiration Risk  Mild aspiration risk(but reduced following general precautions)    Diet Recommendation  Dysphagia level 3 (Minced meats w/ gravy), thin liquids; general aspiration precautions; assistance at meals d/t decreased Left side awareness and LUE weakness  Medication Administration: Whole meds with puree(IF needed for easier, safer swallowing)    Other  Recommendations Recommended Consults: (Dietician f/u) Oral Care Recommendations: Oral care BID;Staff/trained caregiver to provide oral care Other Recommendations: (n/a)   Follow up Recommendations None      Frequency and Duration min  2x/week  1 week       Prognosis Prognosis for Safe Diet Advancement: Good Barriers to Reach Goals: Severity of deficits;Medication      Swallow Study    General Date of Onset: 06/06/19 HPI: Pt is a 68 yo female w/ current tobacco use presented to the ED on 7/13 with U/L Left sided numbness and weakness. MRI positive for acute embolic infarctions in the R MCA territory and with minimal petechial blood products in the R posterior temporal occipital parietal junction. CT angio reveals severe carotid stenosis estimated at 80% or worse on the right and 70% or worse on the left. Per vascular consult, in setting of acute CVA, recommend waiting at least 2-3 weeks before open endarterectomy vs stenting.  Per NSG this AM, pt noted to have left facial and arm twitching. Per MD, concern is for simple partial status since patient is able to follow commands and speak with this jerking. Likely related to acute infarcts per Neurology note.  Type of Study: Bedside Swallow Evaluation Previous Swallow Assessment: none reported Diet Prior to this Study: Regular;Thin liquids Temperature Spikes Noted: No(wbc 9.6) Respiratory Status: Room air History of Recent Intubation: No Behavior/Cognition: Alert;Cooperative;Pleasant mood;Distractible;Requires cueing(recently received medication d/t Left side twitching) Oral Cavity Assessment: Within Functional Limits(noted labial stitches(lower lip)) Oral Care Completed by SLP: Yes Oral Cavity - Dentition: Dentures, bottom;Missing dentition(on top) Vision: (somewhat impaired - decreased Left awareness) Self-Feeding Abilities: Able to feed self;Needs assist;Needs set up(decreased Left awareness) Patient Positioning: Upright in bed(needed positioning) Baseline Vocal Quality: Normal Volitional Cough: Strong Volitional Swallow: Able to elicit    Oral/Motor/Sensory Function Overall Oral Motor/Sensory Function: Moderate impairment Facial ROM: Reduced left(twitching) Facial Symmetry: Abnormal symmetry left(twitching) Facial Strength: Reduced left Lingual ROM: Reduced left Lingual Symmetry: Abnormal symmetry left(deviation) Lingual  Strength: Reduced(min) Mandible: Within Functional Limits   Ice Chips Ice chips: Within functional limits Presentation: Spoon(fed; 3 trials)   Thin Liquid Thin Liquid: Within functional limits Presentation: Self Fed;Straw(assisted; ~8-10 trials) Other Comments: NSG denied any difficulty swallowing w/ liquids and Pills earlier this AM    Nectar Thick Nectar Thick Liquid: Not tested   Honey Thick Honey Thick Liquid: Not tested   Puree Puree: Within functional limits Presentation: Spoon(fed; 4 trials)   Solid     Solid: Impaired Presentation: Spoon(fed; 4 trials) Oral Phase Impairments: Impaired mastication;Reduced lingual movement/coordination(slower movements) Oral Phase Functional Implications: Prolonged oral transit;Impaired mastication(min) Pharyngeal Phase Impairments: (none)       Orinda Kenner, MS, CCC-SLP Lita Flynn 06/07/2019,4:21 PM

## 2019-06-07 NOTE — Evaluation (Signed)
Physical Therapy Evaluation Patient Details Name: Tammy Nunez MRN: [id number] DOB: 1951/04/20 Today's Date: 06/07/2019   History of Present Illness  68yo female presented to the ED on 7/13 with L sided numbness and weakness. MRI positive for acute embolic infarctions in the R MCA territory and with minimal petechial blood products in the R posterior temporal occipital parietal junction. CT angio reveals severe carotid stenosis estimated at 80% or worse on the right and 70% or worse on the left. Per vascular consult, in setting of acute CVA, recommend waiting at least 2-3 weeks before open endarterectomy vs stenting.     Clinical Impression  Pt leaning to R in bed when PT entered room, woke to PT voice and was able to provide PLOF information. Pt reported prior to this hospital admission she was independent and lives with her husband in a one story home, either stairs or ramp to enter.   Of note, PT evaluation limited due to patient lethargy, per RN recently administered ativan to address L side of face twitching. The patient would benefit from further assessment of mobility tomorrow to maximize pt performance. Pt continuously highly motivated to mobilize and participate despite sleepiness/lethargy. Upon assessment pt demonstrated UE and LE weakness of L limbs. Pt also demonstrated decreased awareness/sensation of LUE and LLE as well as impaired coordination and proprioception. Mobility assessed, pt demonstrated bed mobility minA for LLE and trunk management, pt with excellent ability to mobilize R side. Sit <> stand with 2 person hand held assist, minA to achieve and maintain standing. Ambulation attempted this session but very limited due to lethargy suspect of recently administered ativan. Pt was able to step forward with RLE with minAx2, unable to advance LLE potentially due to proprioception deficits/weakness/inattention. MaxAx2 to return to EOB safely.  Overall the patient demonstrated deficits  (see "PT Problem List") that impede the patient's functional abilities, safety, and mobility and would benefit from skilled PT intervention. Recommend transition to acute inpatient rehab upon discharge for high-intensity, post-acute rehab services.      Follow Up Recommendations CIR    Equipment Recommendations  Other (comment)(TBD)    Recommendations for Other Services OT consult;Speech consult     Precautions / Restrictions Precautions Precautions: Fall Restrictions Weight Bearing Restrictions: No      Mobility  Bed Mobility Overal bed mobility: Needs Assistance Bed Mobility: Supine to Sit;Sit to Supine     Supine to sit: Min assist Sit to supine: Min assist;+2 for safety/equipment   General bed mobility comments: Pt able to move RLE and RUE well to initiate movement, cues to attend to L to transfer to the L, minA for L sided management and trunk balance once sitting EOB  Transfers Overall transfer level: Needs assistance Equipment used: Rolling walker (2 wheeled) Transfers: Sit to/from Stand Sit to Stand: +2 physical assistance;Min assist        Lateral/Scoot Transfers: Mod assist General transfer comment: Pt able to come to standing with minAx2 and minAx2 required to maintain balance  Ambulation/Gait Ambulation/Gait assistance: Mod assist;Max assist;+2 physical assistance Gait Distance (Feet): 1 Feet Assistive device: None;2 person hand held assist       General Gait Details: Ambulation attempted this session, very limited due to lethargy suspect of recently administered ativan per RN. Pt would benefit from further assessment next session. Pt able to step forward with RLE with minAx2, unable to advance LLE potentially due to proprioception deficits. MaxAx2 to return to EOB safely.  Stairs  Wheelchair Mobility    Modified Rankin (Stroke Patients Only)       Balance Overall balance assessment: Needs assistance Sitting-balance support: Feet  supported;Single extremity supported Sitting balance-Leahy Scale: Poor Sitting balance - Comments: Pt with lateral and posterior lean in sitting, min-modA to maintain. 2 instances of able to maintain with CGA, difficulty this session suspect to recent administer of ativan/pt lethargy. Postural control: Posterior lean;Left lateral lean Standing balance support: Single extremity supported Standing balance-Leahy Scale: Poor Standing balance comment: With static standing with two hand held assist, minA to maintain                             Pertinent Vitals/Pain Pain Assessment: No/denies pain    Home Living Family/patient expects to be discharged to:: Private residence Living Arrangements: Spouse/significant other Available Help at Discharge: Family;Available 24 hours/day Type of Home: House Home Access: Stairs to enter;Ramped entrance Entrance Stairs-Rails: None Entrance Stairs-Number of Steps: 4-5 steps to front door; ramped entrance to kitchen Home Layout: One level Home Equipment: None      Prior Function Level of Independence: Independent         Comments: Indep, no AD, only fall was in the ED this admission     Hand Dominance   Dominant Hand: Right    Extremity/Trunk Assessment   Upper Extremity Assessment Upper Extremity Assessment: Defer to OT evaluation LUE Deficits / Details: grossly 2 to 2+/5, increased flexion through LUE noted when pt in standing, decreased attention to L side, does not protect LUE requiring verbal, visual, and sometimes tactile cues to increase attention to L side LUE Sensation: decreased proprioception;decreased light touch LUE Coordination: decreased fine motor;decreased gross motor    Lower Extremity Assessment Lower Extremity Assessment: RLE deficits/detail;LLE deficits/detail RLE Deficits / Details: grossly 4+/5 (assessed laying in bed) RLE Sensation: WNL RLE Coordination: WNL LLE Deficits / Details: pt able to perform  SLR on LLE, knee flexion with tactile and extensive verbal cues, able to wiggle toes and minimally DF/PF. Pt with difficulty attending to L side, limited due to lethargy suspecting medications LLE Sensation: decreased light touch;decreased proprioception LLE Coordination: decreased fine motor;decreased gross motor    Cervical / Trunk Assessment Cervical / Trunk Assessment: Other exceptions Cervical / Trunk Exceptions: posterior and L lean with sitting due to increased lethargy after medication  Communication   Communication: No difficulties  Cognition Arousal/Alertness: Suspect due to medications;Lethargic Behavior During Therapy: Flat affect Overall Cognitive Status: Impaired/Different from baseline Area of Impairment: Safety/judgement;Following commands;Problem solving                       Following Commands: Follows multi-step commands with increased time Safety/Judgement: Decreased awareness of deficits;Decreased awareness of safety   Problem Solving: Requires verbal cues;Requires tactile cues(visual cues)        General Comments General comments (skin integrity, edema, etc.): upper lip with stitches due to fall in ED. L sided face twitching noted, RN aware and had recently administered ativan to address.    Exercises Other Exercises Other Exercises: visual attention for self feeding breakfast requiring modifications to set up and max verbal cues to increase attention to L side of plate to improve access to meal   Assessment/Plan    PT Assessment Patient needs continued PT services  PT Problem List Decreased strength;Decreased mobility;Decreased safety awareness;Decreased range of motion;Impaired tone;Decreased coordination;Decreased knowledge of precautions;Decreased activity tolerance;Decreased balance;Decreased knowledge of use of DME;Impaired sensation  PT Treatment Interventions DME instruction;Therapeutic exercise;Gait training;Balance  training;Neuromuscular re-education;Stair training;Functional mobility training;Therapeutic activities;Patient/family education    PT Goals (Current goals can be found in the Care Plan section)  Acute Rehab PT Goals Patient Stated Goal: to get better PT Goal Formulation: With patient Time For Goal Achievement: 06/21/19 Potential to Achieve Goals: Good    Frequency 7X/week   Barriers to discharge        Co-evaluation               AM-PAC PT "6 Clicks" Mobility  Outcome Measure Help needed turning from your back to your side while in a flat bed without using bedrails?: A Lot Help needed moving from lying on your back to sitting on the side of a flat bed without using bedrails?: A Lot Help needed moving to and from a bed to a chair (including a wheelchair)?: A Lot Help needed standing up from a chair using your arms (e.g., wheelchair or bedside chair)?: A Lot Help needed to walk in hospital room?: Total Help needed climbing 3-5 steps with a railing? : Total 6 Click Score: 10    End of Session Equipment Utilized During Treatment: Gait belt Activity Tolerance: Patient limited by lethargy Patient left: in bed;with call bell/phone within reach;with bed alarm set Nurse Communication: Mobility status PT Visit Diagnosis: Other abnormalities of gait and mobility (R26.89);Unsteadiness on feet (R26.81);Muscle weakness (generalized) (M62.81);Hemiplegia and hemiparesis Hemiplegia - Right/Left: Left Hemiplegia - dominant/non-dominant: Non-dominant Hemiplegia - caused by: (embolic infarctions of R middle cerebral artery territory)    Time: 2956-2130 PT Time Calculation (min) (ACUTE ONLY): 23 min   Charges:   PT Evaluation $PT Eval Moderate Complexity: 1 Mod PT Treatments $Therapeutic Activity: 8-22 mins       Lieutenant Diego PT, DPT 1:46 PM,06/07/19 [phone]

## 2019-06-07 NOTE — TOC Progression Note (Signed)
Transition of Care [facility]) - Progression Note    Patient Details  Name: Tammy Nunez MRN: [id number] Date of Birth: March 11, 1951  Transition of Care [facility]) CM/SW Contact  Aneta Hendershott, Lenice Llamas Phone Number: [phone]  06/07/2019, 4:13 PM  Clinical Narrative:   Clinical Social Worker (CSW) received a call from patient's sister Helene Kelp Day. Sister asked about inpatient rehab at [facility]. CSW explained that inpatient rehab at [facility] is reviewing patient's chart and PT needs to work with patient when she is more awake before insurance authorization can be started. Sister verbalized her understanding. CSW will continue to follow and assist as needed.        Expected Discharge Plan: IP Rehab Facility Barriers to Discharge: Continued Medical Work up  Expected Discharge Plan and Services Expected Discharge Plan: [place] In-house Referral: Clinical Social Work Discharge Planning Services: CM Consult Post Acute Care Choice: IP Rehab Living arrangements for the past 2 months: Single Family Home Expected Discharge Date: 06/08/19                                     Social Determinants of Health (SDOH) Interventions    Readmission Risk Interventions No flowsheet data found.

## 2019-06-07 NOTE — TOC Initial Note (Addendum)
Transition of Care [facility]) - Initial/Assessment Note    Patient Details  Name: Tammy Nunez MRN: [id number] Date of Birth: 1951-05-27  Transition of Care [facility]) CM/SW Contact:    Jerald Villalona, Lenice Llamas Phone Number: [phone]  06/07/2019, 2:14 PM  Clinical Narrative: PT and OT are recommending CIR. Clinical Education officer, museum (CSW) met with patient alone at bedside to discuss D/C plan. Patient was laying in the bed and was alert and oriented X3. CSW introduced self and explained role of CSW department. Patient lives in [place] with her husband Izell Buckhorn. CSW explained CIR process and that Health Team will have to approve it. CSW explained that CIR is in [facility] in [place]. Patient reported that she wants to go to CIR. CSW contacted patient's husband Izell Wittenberg and made him aware of above. Per husband he will contact his family and make them aware of above. CSW left [facility] admissions coordinator a voicemail making her aware of above.       CSW received a call back from [facility] stating that PT will need to work with her when she is more awake before insurance authorization can be pursued. CSW sent PT a message making her aware of above.              Expected Discharge Plan: IP Rehab Facility Barriers to Discharge: Continued Medical Work up   Patient Goals and CMS Choice Patient states their goals for this hospitalization and ongoing recovery are:: To get back to her baseline.   Choice offered to / list presented to : Patient  Expected Discharge Plan and Services Expected Discharge Plan: [place] In-house Referral: Clinical Social Work Discharge Planning Services: CM Consult Post Acute Care Choice: IP Rehab Living arrangements for the past 2 months: Single Family Home Expected Discharge Date: 06/08/19                                    Prior Living Arrangements/Services Living arrangements for the past 2 months: Single Family Home Lives with::  Spouse Patient language and need for interpreter reviewed:: No Do you feel safe going back to the place where you live?: Yes      Need for Family Participation in Patient Care: Yes (Comment) Care giver support system in place?: Yes (comment)   Criminal Activity/Legal Involvement Pertinent to Current Situation/Hospitalization: No - Comment as needed  Activities of Daily Living Home Assistive Devices/Equipment: None ADL Screening (condition at time of admission) Patient's cognitive ability adequate to safely complete daily activities?: Yes Is the patient deaf or have difficulty hearing?: No Does the patient have difficulty seeing, even when wearing glasses/contacts?: No Does the patient have difficulty concentrating, remembering, or making decisions?: No Patient able to express need for assistance with ADLs?: Yes Does the patient have difficulty dressing or bathing?: No Independently performs ADLs?: Yes (appropriate for developmental age) Does the patient have difficulty walking or climbing stairs?: Yes Weakness of Legs: Left Weakness of Arms/Hands: Left  Permission Sought/Granted Permission sought to share information with : Chartered certified accountant granted to share information with : Yes, Verbal Permission Granted              Emotional Assessment Appearance:: Appears stated age   Affect (typically observed): Calm, Pleasant Orientation: : Oriented to Self, Oriented to Place, Oriented to  Time, Oriented to Situation Alcohol / Substance Use: Not Applicable Psych Involvement: No (comment)  Admission  diagnosis:  Acute ischemic right MCA stroke ([place]) [I63.511] Carotid artery stenosis with cerebral infarction [facility]) [I63.239] Patient Active Problem List   Diagnosis Date Noted  . Acute CVA (cerebrovascular accident) ([place]) 06/06/2019   PCP:  Patient, No Pcp Per Pharmacy:   [id number] - [address] [place] [address] Phone: [phone] Fax: [phone]  [place] [address]), [place]) [place] [address] Phone: [phone] Fax: [phone]     Social Determinants of Health (SDOH) Interventions    Readmission Risk Interventions No flowsheet data found.

## 2019-06-07 NOTE — Progress Notes (Signed)
*  PRELIMINARY RESULTS* Echocardiogram 2D Echocardiogram has been performed.  Tammy Nunez 06/07/2019, 10:14 AM

## 2019-06-07 NOTE — Evaluation (Signed)
Occupational Therapy Evaluation Patient Details Name: Tammy Nunez MRN: [id number] DOB: [birth year] Today's Date: 06/07/2019    History of Present Illness 68yo female presented to the ED on 7/13 with L sided numbness and weakness. MRI positive for acute embolic infarctions in the R MCA territory and with minimal petechial blood products in the R posterior temporal occipital parietal junction. CT angio reveals severe carotid stenosis estimated at 80% or worse on the right and 70% or worse on the left. Per vascular consult, in setting of acute CVA, recommend waiting at least 2-3 weeks before open endarterectomy vs stenting.   Clinical Impression   Pt seen for OT evaluation this date. Prior to hospital admission, pt was independent in all aspects of ADL and mobility. Pt lives with her spouse in a one-level home with 4-5 steps to enter through the front door and ramped entrance to the kitchen. Pt eager to work with therapist and motivated throughout session. Currently pt demonstrates impairments in LUE/LLE strength (UE worse than LE), coordination, balance with LOB posteriorly and to the left with pushing noted, sensory inattention to L side (unable to identify sensory input to L side when presented with bilateral input but can identify when presented only to L side), and difficulty visually tracking to L side requiring max cues to perform cervical rotation and visually track to far L side. Pt is R hand dominant and does not attempt to incorporate LUE into bilat tasks, requiring assist and cues from therapist to prepare her coffee. Attends to far R side of breakfast tray but with max cues improves access to midline of tray. Pt requires min-mod assist for sup<>sit for LUE/LLE mgt, occasional assist for sitting balance and correct L lateral and posterior lean, stands EOB with Min A requiring increasing assist due to pushing. Pt does not protect LUE during mobility tasks requiring cues and assist from therapist  for positioning. Pt is eager to return to PLOF with increased independence and functional use of her L arm and leg. Pt educated on safe positioning of LUE in order to promote functional return, improve safety, and promote skin integrity on this date. Pt would benefit from high intensity skilled OT services to address noted impairments and functional limitations (see below for any additional details) in order to maximize safety and independence while minimizing falls risk and caregiver burden.  Upon hospital discharge, recommend pt discharge to CIR to maximize safety and return to PLOF.    Follow Up Recommendations  CIR    Equipment Recommendations  3 in 1 bedside commode    Recommendations for Other Services Rehab consult     Precautions / Restrictions Precautions Precautions: Fall Restrictions Weight Bearing Restrictions: No      Mobility Bed Mobility Overal bed mobility: Needs Assistance Bed Mobility: Supine to Sit;Sit to Supine     Supine to sit: Min assist;Mod assist Sit to supine: Min assist;Mod assist   General bed mobility comments: Assist for L side  Transfers Overall transfer level: Needs assistance Equipment used: Rolling walker (2 wheeled) Transfers: Sit to/from Stand;Lateral/Scoot Transfers Sit to Stand: Min assist        Lateral/Scoot Transfers: Mod assist General transfer comment: LUE noted to demonstrate increased flexion with standing effort and increasingly pushes to L side    Balance Overall balance assessment: Needs assistance Sitting-balance support: Feet supported;Single extremity supported Sitting balance-Leahy Scale: Poor Sitting balance - Comments: occasional physical assist and multimodal cues for sitting balance with L lateral and posterior lean noted but with  cuing, pt able to self correct at times Postural control: Posterior lean;Left lateral lean Standing balance support: Single extremity supported Standing balance-Leahy Scale:  Poor Standing balance comment: increasingly pushes to the L side with verbal cue does lean towards R to improve static standing balance briefly                           ADL either performed or assessed with clinical judgement   ADL Overall ADL's : Needs assistance/impaired Eating/Feeding: Set up Eating/Feeding Details (indicate cue type and reason): long sitting in bed with pillow under LUE for optimal positioning/safety, pt required assist for set up of coffee (does not attempt to incorporate LUE into task of opening creamer container or sugar packets) and other tray items, initiall attending only to far R side of tray, with max verbal/visual cues pt improves attention to midline of tray Grooming: Sitting;Set up;Min guard Grooming Details (indicate cue type and reason): EOB with CGA and occasional cues for upright sitting balance (leans/pushes to L occasionally) and using R dom UE, pt able to perform but does not incorporate LUE into task - would require Max A for assist to incorporate LUE into task Upper Body Bathing: Moderate assistance   Lower Body Bathing: Moderate assistance;Sitting/lateral leans   Upper Body Dressing : Sitting;Moderate assistance   Lower Body Dressing: Sitting/lateral leans;Moderate assistance   Toilet Transfer: BSC;Stand-pivot;Moderate assistance                   Vision Baseline Vision/History: No visual deficits Patient Visual Report: No change from baseline Vision Assessment?: Yes Eye Alignment: Within Functional Limits Ocular Range of Motion: Restricted on the left Alignment/Gaze Preference: Within Defined Limits Tracking/Visual Pursuits: Unable to hold eye position out of midline;Impaired - to be further tested in functional context;Requires cues, head turns, or add eye shifts to track(decreased tracking of both eyes to L side) Visual Fields: Impaired-to be further tested in functional context;Other (comment)(decreased visual attention to  L side)     Perception Perception Comments: earth vertical sense impaired, pushes to L side   Praxis      Pertinent Vitals/Pain Pain Assessment: No/denies pain     Hand Dominance Right   Extremity/Trunk Assessment Upper Extremity Assessment Upper Extremity Assessment: LUE deficits/detail(RUE WFL) LUE Deficits / Details: grossly 2 to 2+/5, increased flexion through LUE noted when pt in standing, decreased attention to L side, does not protect LUE requiring verbal, visual, and sometimes tactile cues to increase attention to L side LUE Sensation: decreased proprioception;decreased light touch LUE Coordination: decreased fine motor;decreased gross motor   Lower Extremity Assessment Lower Extremity Assessment: Defer to PT evaluation;LLE deficits/detail(RLE WFL) LLE Deficits / Details: grossly 3+/5 to 4-/5 more impaired distally LLE Sensation: decreased light touch;decreased proprioception LLE Coordination: decreased fine motor;decreased gross motor   Cervical / Trunk Assessment Cervical / Trunk Assessment: Other exceptions Cervical / Trunk Exceptions: pushes to L side   Communication Communication Communication: No difficulties   Cognition Arousal/Alertness: Awake/alert Behavior During Therapy: Flat affect Overall Cognitive Status: Impaired/Different from baseline Area of Impairment: Safety/judgement;Following commands;Problem solving                       Following Commands: Follows multi-step commands with increased time Safety/Judgement: Decreased awareness of deficits;Decreased awareness of safety   Problem Solving: Requires verbal cues(visual cues)     General Comments  upper lip noted with stitches which pt reports is from fall in  the ED; L side of face noted to twitch occasionally - RN notified and RN/MD aware already    Exercises Other Exercises Other Exercises: visual attention for self feeding breakfast requiring modifications to set up and max verbal  cues to increase attention to L side of plate to improve access to meal   Shoulder Instructions      Home Living Family/patient expects to be discharged to:: Private residence Living Arrangements: Spouse/significant other Available Help at Discharge: Family;Available 24 hours/day Type of Home: House Home Access: Stairs to enter;Ramped entrance Entrance Stairs-Number of Steps: 4-5 steps to front door; ramped entrance to kitchen Entrance Stairs-Rails: None Home Layout: One level     Bathroom Shower/Tub: Teacher, early years/pre: Standard     Home Equipment: None          Prior Functioning/Environment Level of Independence: Independent        Comments: Indep, no AD, only fall was in the ED this admission        OT Problem List: Decreased strength;Decreased cognition;Decreased coordination;Decreased safety awareness;Impaired balance (sitting and/or standing);Decreased knowledge of use of DME or AE;Impaired UE functional use;Impaired vision/perception      OT Treatment/Interventions: Self-care/ADL training;Therapeutic exercise;Therapeutic activities;Neuromuscular education;Visual/perceptual remediation/compensation;DME and/or AE instruction;Patient/family education;Balance training    OT Goals(Current goals can be found in the care plan section) Acute Rehab OT Goals Patient Stated Goal: get better OT Goal Formulation: With patient Time For Goal Achievement: 06/21/19 Potential to Achieve Goals: Good ADL Goals Pt Will Perform Upper Body Dressing: with min assist;sitting(w/ cues for using learned hemi techniques and assist for sitting balance as needed) Pt Will Transfer to Toilet: with min assist;ambulating;bedside commode(LRAD for amb) Additional ADL Goal #1: Pt will perform ADL table top task seated EOB with fair sitting balance and min verbal/visual cues to attend to L side of tray in order to access needed items.  OT Frequency: Min 3X/week   Barriers to D/C:             Co-evaluation              AM-PAC OT "6 Clicks" Daily Activity     Outcome Measure Help from another person eating meals?: A Little Help from another person taking care of personal grooming?: A Little Help from another person toileting, which includes using toliet, bedpan, or urinal?: A Lot Help from another person bathing (including washing, rinsing, drying)?: A Lot Help from another person to put on and taking off regular upper body clothing?: A Lot Help from another person to put on and taking off regular lower body clothing?: A Lot 6 Click Score: 14   End of Session Equipment Utilized During Treatment: Gait belt;Rolling walker  Activity Tolerance: Patient tolerated treatment well Patient left: in bed;with call bell/phone within reach;with bed alarm set  OT Visit Diagnosis: Other abnormalities of gait and mobility (R26.89);Hemiplegia and hemiparesis;History of falling (Z91.81);Other symptoms and signs involving cognitive function Hemiplegia - Right/Left: Left Hemiplegia - dominant/non-dominant: Non-Dominant Hemiplegia - caused by: Cerebral infarction                Time: 1829-9371 OT Time Calculation (min): 30 min Charges:  OT General Charges $OT Visit: 1 Visit OT Evaluation $OT Eval Moderate Complexity: 1 Mod OT Treatments $Neuromuscular Re-education: 8-22 mins  Jeni Salles, MPH, MS, OTR/L ascom [phone] 06/07/19, 11:01 AM

## 2019-06-07 NOTE — Telephone Encounter (Signed)
  This was placed in the secure chat by Marcelle Overlie PA, patient will be called.     Will plan on a right carotid artery stenting in about three weeks (due to acute CVA), followed by a left carotid artery stenting six weeks later with Dr. Delana Meyer. ASA / Plavix in preparation. Our office scheduler will reach out to the patient to place her on the schedule.

## 2019-06-07 NOTE — Progress Notes (Signed)
Rehab Admissions Coordinator Note:  Per OT recommendation, this patient was screened by Jhonnie Garner for appropriateness for an Inpatient Acute Rehab Consult.  At this time, we are recommending Inpatient Rehab consult. AC will contact MD to request order.   Jhonnie Garner 06/07/2019, 11:30 AM  I can be reached at [phone].

## 2019-06-07 NOTE — Progress Notes (Signed)
[facility] Daily Progress Note   Subjective: Patient tearful this afternoon.  Still with some left sided upper/lower extremity weakness.  No issues overnight.  Objective: Vitals:   06/07/19 0425 06/07/19 0823 06/07/19 1226 06/07/19 1416  BP: (!) 154/69 (!) 167/61 (!) 160/65 (!) 162/77  Pulse: 92 91 92 95  Resp:      Temp: 99.5 F (37.5 C) 99.1 F (37.3 C)    TempSrc: Oral Oral    SpO2: 99% 99% 99% 98%  Weight:      Height:        Intake/Output Summary (Last 24 hours) at 06/07/2019 1458 Last data filed at 06/07/2019 1300 Gross per 24 hour  Intake 336.13 ml  Output -  Net 336.13 ml   Physical Exam: A&Ox3, NAD CV: RRR Pulmonary: CTA Bilaterally Abdomen: Soft, Nontender, Nondistended Neuro: Stable weakness to the left upper/lower extremity Vascular: Warm, nontender, no edema   Laboratory: CBC    Component Value Date/Time   WBC 9.6 06/06/2019 0636   HGB 16.3 (H) 06/06/2019 0636   HCT 46.5 (H) 06/06/2019 0636   PLT 211 06/06/2019 0636   BMET    Component Value Date/Time   NA 138 06/06/2019 0636   K 3.6 06/06/2019 0636   CL 102 06/06/2019 0636   CO2 20 (L) 06/06/2019 0636   GLUCOSE 283 (H) 06/06/2019 0636   BUN 12 06/06/2019 0636   CREATININE 0.94 06/06/2019 0636   CALCIUM 9.8 06/06/2019 0636   GFRNONAA >60 06/06/2019 0636   GFRAA >60 06/06/2019 0636   Assessment/Planning: The patient is a 68 year old female with no known medical history at the time of arrival to the [facility] emergency department with a chief complaint of left upper/lower sided weakness. 1. Carotid Stenosis: Patient with severe carotid stenosis bilaterally.  Right internal carotid artery greater than left internal carotid artery.   2. MRI notable for: Embolic infarctions in the right middle cerebral artery territory as above. Minimal petechial blood products in the right posterior temporal occipital parietal junction region without frank hematoma.  No mass effect or shift. 3.  We will plan on right carotid artery stenting in 2 weeks followed by left carotid artery stenting 6 weeks after.  Our OR scheduler reach out to the patient for scheduling. 4.  Patient is on aspirin and Plavix.  Discussed with Dr. Eber Hong  PA-C 06/07/2019 2:58 PM

## 2019-06-07 NOTE — Consult Note (Addendum)
Referring Physician: Sudini    Chief Complaint: Left sided weakness  HPI: Tammy Nunez is an 68 y.o. female who reports going to bed at baseline on 7/12.  Awakened 7/13 with left sided weakness.  Presented for evaluation when symptoms did not improve.  Patient was outside window for tPA.  On no antiplatelet therapy.  Has not been to the doctor in quite some time.   Initial NIHSS of 0.    Date last known well: Date: 06/05/2019 Time last known well: Time: 22:00 tPA Given: No: Outside time window.  History reviewed. No pertinent past medical history.  Past Surgical History:  Procedure Laterality Date  . ECTOPIC PREGNANCY SURGERY      History reviewed. No pertinent family history.   Social History:  reports that she has been smoking. She has never used smokeless tobacco. She reports that she does not drink alcohol or use drugs.  Allergies: No Known Allergies  Medications:  I have reviewed the patient's current medications. Prior to Admission:  Medications Prior to Admission  Medication Sig Dispense Refill Last Dose  . ibuprofen (ADVIL) 200 MG tablet Take 200-400 mg by mouth every 6 (six) hours as needed for fever or mild pain.   Unknown at PRN  . vitamin B-12 (CYANOCOBALAMIN) 1000 MCG tablet Take 1,000 mcg by mouth daily.   Past Week at Unknown time   Scheduled: . aspirin EC  81 mg Oral Daily  . atorvastatin  40 mg Oral q1800  . clopidogrel  75 mg Oral Daily  . enoxaparin (LOVENOX) injection  40 mg Subcutaneous Daily  . insulin aspart  0-9 Units Subcutaneous TID WC  . levETIRAcetam  500 mg Oral BID  . lisinopril  10 mg Oral Daily    ROS: History obtained from the patient  General ROS: negative for - chills, fatigue, fever, night sweats, weight gain or weight loss Psychological ROS: negative for - behavioral disorder, hallucinations, memory difficulties, mood swings or suicidal ideation Ophthalmic ROS: negative for - blurry vision, double vision, eye pain or loss of  vision ENT ROS: negative for - epistaxis, nasal discharge, oral lesions, sore throat, tinnitus or vertigo Allergy and Immunology ROS: negative for - hives or itchy/watery eyes Hematological and Lymphatic ROS: negative for - bleeding problems, bruising or swollen lymph nodes Endocrine ROS: negative for - galactorrhea, hair pattern changes, polydipsia/polyuria or temperature intolerance Respiratory ROS: negative for - cough, hemoptysis, shortness of breath or wheezing Cardiovascular ROS: negative for - chest pain, dyspnea on exertion, edema or irregular heartbeat Gastrointestinal ROS: negative for - abdominal pain, diarrhea, hematemesis, nausea/vomiting or stool incontinence Genito-Urinary ROS: negative for - dysuria, hematuria, incontinence or urinary frequency/urgency Musculoskeletal ROS: negative for - joint swelling or muscular weakness Neurological ROS: as noted in HPI Dermatological ROS: negative for rash and skin lesion changes  Physical Examination: Blood pressure (!) 167/61, pulse 91, temperature 99.1 F (37.3 C), temperature source Oral, resp. rate 18, height 4\' 4"  (1.321 m), weight 77.1 kg, SpO2 99 %.  HEENT-  Normocephalic, stitched laceration on lip.  Normal external eye and conjunctiva.  Normal TM's bilaterally.  Normal auditory canals and external ears. Normal external nose, mucus membranes and septum.  Normal pharynx. Cardiovascular- S1, S2 normal, pulses palpable throughout   Lungs- chest clear, no wheezing, rales, normal symmetric air entry Abdomen- soft, non-tender; bowel sounds normal; no masses,  no organomegaly Extremities- no edema Lymph-no adenopathy palpable Musculoskeletal-no joint tenderness, deformity or swelling Skin-warm and dry, no hyperpigmentation, vitiligo, or suspicious lesions  Neurological  Examination   Mental Status: Alert, oriented, thought content appropriate.  Speech fluent without evidence of aphasia.  Able to follow 3 step commands without  difficulty. Cranial Nerves: II: LHH III,IV, VI: ptosis not present, extra-ocular motions intact bilaterally V,VII: left facial droop, facial light touch sensation normal bilaterally.  Left facial twitching noted.   VIII: hearing normal bilaterally IX,X: gag reflex present XI: bilateral shoulder shrug XII: midline tongue extension Motor: Right : Upper extremity   5/5    Left:     Upper extremity   3/5  Lower extremity   5/5     Lower extremity   3-/5 Tone and bulk:normal tone throughout; no atrophy noted Sensory: Pinprick and light touch intact throughout, bilaterally Deep Tendon Reflexes: Symmetric throughout Plantars: Right: mute   Left: mute Cerebellar: Normal finger-to-nose and normal heel-to-shin testing on the right.  Unable to perform well on the left due to weakness Gait: not tested due to safety concerns    Laboratory Studies:  Basic Metabolic Panel: Recent Labs  Lab 06/06/19 0636  NA 138  K 3.6  CL 102  CO2 20*  GLUCOSE 283*  BUN 12  CREATININE 0.94  CALCIUM 9.8    Liver Function Tests: Recent Labs  Lab 06/06/19 0636  AST 26  ALT 22  ALKPHOS 86  BILITOT 0.9  PROT 8.1  ALBUMIN 4.7   No results for input(s): LIPASE, AMYLASE in the last 168 hours. No results for input(s): AMMONIA in the last 168 hours.  CBC: Recent Labs  Lab 06/06/19 0636  WBC 9.6  NEUTROABS 5.8  HGB 16.3*  HCT 46.5*  MCV 87.2  PLT 211    Cardiac Enzymes: No results for input(s): CKTOTAL, CKMB, CKMBINDEX, TROPONINI in the last 168 hours.  BNP: Invalid input(s): POCBNP  CBG: Recent Labs  Lab 06/06/19 1657 06/07/19 0755  GLUCAP 229* 236*    Microbiology: Results for orders placed or performed during the hospital encounter of 06/06/19  SARS Coronavirus 2 (CEPHEID - Performed in [place] hospital lab), Hosp Order     Status: None   Collection Time: 06/06/19  7:51 AM   Specimen: Nasopharyngeal Swab  Result Value Ref Range Status   SARS Coronavirus 2 NEGATIVE  NEGATIVE Final    Comment: (NOTE) If result is NEGATIVE SARS-CoV-2 target nucleic acids are NOT DETECTED. The SARS-CoV-2 RNA is generally detectable in upper and lower  respiratory specimens during the acute phase of infection. The lowest  concentration of SARS-CoV-2 viral copies this assay can detect is 250  copies / mL. A negative result does not preclude SARS-CoV-2 infection  and should not be used as the sole basis for treatment or other  patient management decisions.  A negative result may occur with  improper specimen collection / handling, submission of specimen other  than nasopharyngeal swab, presence of viral mutation(s) within the  areas targeted by this assay, and inadequate number of viral copies  (<250 copies / mL). A negative result must be combined with clinical  observations, patient history, and epidemiological information. If result is POSITIVE SARS-CoV-2 target nucleic acids are DETECTED. The SARS-CoV-2 RNA is generally detectable in upper and lower  respiratory specimens dur ing the acute phase of infection.  Positive  results are indicative of active infection with SARS-CoV-2.  Clinical  correlation with patient history and other diagnostic information is  necessary to determine patient infection status.  Positive results do  not rule out bacterial infection or co-infection with other viruses. If result is  PRESUMPTIVE POSTIVE SARS-CoV-2 nucleic acids MAY BE PRESENT.   A presumptive positive result was obtained on the submitted specimen  and confirmed on repeat testing.  While 2019 novel coronavirus  (SARS-CoV-2) nucleic acids may be present in the submitted sample  additional confirmatory testing may be necessary for epidemiological  and / or clinical management purposes  to differentiate between  SARS-CoV-2 and other Sarbecovirus currently known to infect humans.  If clinically indicated additional testing with an alternate test  methodology [phone]) is  advised. The SARS-CoV-2 RNA is generally  detectable in upper and lower respiratory sp ecimens during the acute  phase of infection. The expected result is Negative. Fact Sheet for Patients:  StrictlyIdeas.no Fact Sheet for Healthcare Providers: BankingDealers.co.za This test is not yet approved or cleared by the [place] FDA and has been authorized for detection and/or diagnosis of SARS-CoV-2 by FDA under an Emergency Use Authorization (EUA).  This EUA will remain in effect (meaning this test can be used) for the duration of the COVID-19 declaration under Section 564(b)(1) of the Act, 21 U.S.C. section 360bbb-3(b)(1), unless the authorization is terminated or revoked sooner. Performed at [facility], [address] [place], [place] [address]     Coagulation Studies: Recent Labs    06/06/19 0636  LABPROT 13.0  INR 1.0    Urinalysis:  Recent Labs  Lab 06/06/19 1043  COLORURINE YELLOW*  LABSPEC 1.040*  PHURINE 6.0  GLUCOSEU >=500*  HGBUR SMALL*  BILIRUBINUR NEGATIVE  KETONESUR 20*  PROTEINUR 100*  NITRITE NEGATIVE  LEUKOCYTESUR NEGATIVE    Lipid Panel:    Component Value Date/Time   CHOL 305 (H) 06/07/2019 0836   TRIG 244 (H) 06/07/2019 0836   HDL 39 (L) 06/07/2019 0836   CHOLHDL 7.8 06/07/2019 0836   VLDL 49 (H) 06/07/2019 0836   LDLCALC 217 (H) 06/07/2019 0836    HgbA1C: No results found for: HGBA1C  Urine Drug Screen:      Component Value Date/Time   LABOPIA NONE DETECTED 06/06/2019 1043   COCAINSCRNUR NONE DETECTED 06/06/2019 1043   LABBENZ NONE DETECTED 06/06/2019 1043   AMPHETMU NONE DETECTED 06/06/2019 1043   THCU NONE DETECTED 06/06/2019 1043   LABBARB NONE DETECTED 06/06/2019 1043    Alcohol Level: No results for input(s): ETH in the last 168 hours.  Other results: EKG: sinus tachycardia at 116 bpm.  Imaging: Ct Angio Head W Or Wo Contrast  Result Date: 06/06/2019 CLINICAL  DATA:  Numbness of the left arm upon wakening today. EXAM: CT ANGIOGRAPHY HEAD AND NECK TECHNIQUE: Multidetector CT imaging of the head and neck was performed using the standard protocol during bolus administration of intravenous contrast. Multiplanar CT image reconstructions and MIPs were obtained to evaluate the vascular anatomy. Carotid stenosis measurements (when applicable) are obtained utilizing NASCET criteria, using the distal internal carotid diameter as the denominator. CONTRAST:  43mL OMNIPAQUE IOHEXOL 350 MG/ML SOLN COMPARISON:  Head CT same day FINDINGS: CTA NECK FINDINGS Aortic arch: Aortic atherosclerosis.  No aneurysm or dissection. Right carotid system: Common carotid artery is patent to the bifurcation. There is advanced soft and calcified plaque at the carotid bifurcation and ICA bulb with near occlusion in the proximal bulb. Antegrade flow persists but minimal diameter is less than 1 mm. Cervical ICA is small because of flow reduction. Left carotid system: Common carotid artery is patent to the bifurcation region. Extensive soft and calcified plaque at the carotid bifurcation and ICA bulb. Minimal diameter in the ICA bulb is 1.5  mm. Caliber of the more distal ICA is reduced likely due to flow reduction. Question serial stenoses in the more distal cervical ICA. Stenosis at the ICA bulb is estimated at 70% or greater. Difficult to apply NASCET criteria with distal flow reduction. Vertebral arteries: Vertebral artery origins are patent. The vertebral arteries are small vessels that are patent through the cervical region to the foramen magnum. Incidental note of a 40-50% stenosis of the left subclavian artery at the apex. Skeleton: Degenerative cervical spondylosis. Other neck: No mass or lymphadenopathy. Upper chest: Normal Review of the MIP images confirms the above findings CTA HEAD FINDINGS Anterior circulation: Both internal carotid arteries are patent through the skull base and siphon regions.  There is severe atherosclerotic disease in the carotid siphon regions with narrowing. Both vessels appear patent through that area but are very small caliber. The anterior and middle cerebral vessels are patent but also are very small caliber. I cannot identify a proximal embolic occlusion that could be treatable with mechanical intervention. Posterior circulation: Both vertebral arteries are patent through the foramen magnum to the basilar. No basilar stenosis. Posterior circulation branch vessels show flow. Venous sinuses: Patent and normal. Anatomic variants: None significant. Delayed phase: Not performed. Review of the MIP images confirms the above findings IMPRESSION: Severe atherosclerotic disease at both carotid bifurcation and ICA bulbs, right worse than left. Luminal diameter on the right is 1 mm or less in the ICA bulb. Luminal diameter on the left is 1.5 mm in the ICA bulb. There is flow reduction in the diameter of both distal cervical internal carotid arteries, making application of NASCET criteria difficult. Stenosis is estimated at 80% or worse on the right and 70% or worse on the left. No large or medium vessel intracranial occlusion identified. Severe atherosclerotic disease in both carotid siphon regions with narrowing of both internal carotid arteries through that segment. These results were called by telephone at the time of interpretation on 06/06/2019 at 8:45 am to Dr. Carrie Mew , who verbally acknowledged these results. Electronically Signed   By: Nelson Chimes M.D.   On: 06/06/2019 08:54   Ct Head Wo Contrast  Result Date: 06/06/2019 CLINICAL DATA:  Left arm numbness upon awakening EXAM: CT HEAD WITHOUT CONTRAST TECHNIQUE: Contiguous axial images were obtained from the base of the skull through the vertex without intravenous contrast. COMPARISON:  None. FINDINGS: Brain: Cytotoxic edema in the posterior right frontal and low right parietal regions, acute appearing. No hemorrhage,  hydrocephalus, or masslike finding. No pre-existing infarct is seen. Vascular: No hyperdense vessel or unexpected calcification. Skull: Normal. Negative for fracture or focal lesion. Sinuses/Orbits: No acute finding. IMPRESSION: Moderate acute appearing infarct in the right posterior frontal and parietal lobes. Electronically Signed   By: Monte Fantasia M.D.   On: 06/06/2019 07:03   Ct Angio Neck W And/or Wo Contrast  Result Date: 06/06/2019 CLINICAL DATA:  Numbness of the left arm upon wakening today. EXAM: CT ANGIOGRAPHY HEAD AND NECK TECHNIQUE: Multidetector CT imaging of the head and neck was performed using the standard protocol during bolus administration of intravenous contrast. Multiplanar CT image reconstructions and MIPs were obtained to evaluate the vascular anatomy. Carotid stenosis measurements (when applicable) are obtained utilizing NASCET criteria, using the distal internal carotid diameter as the denominator. CONTRAST:  22mL OMNIPAQUE IOHEXOL 350 MG/ML SOLN COMPARISON:  Head CT same day FINDINGS: CTA NECK FINDINGS Aortic arch: Aortic atherosclerosis.  No aneurysm or dissection. Right carotid system: Common carotid artery is patent to  the bifurcation. There is advanced soft and calcified plaque at the carotid bifurcation and ICA bulb with near occlusion in the proximal bulb. Antegrade flow persists but minimal diameter is less than 1 mm. Cervical ICA is small because of flow reduction. Left carotid system: Common carotid artery is patent to the bifurcation region. Extensive soft and calcified plaque at the carotid bifurcation and ICA bulb. Minimal diameter in the ICA bulb is 1.5 mm. Caliber of the more distal ICA is reduced likely due to flow reduction. Question serial stenoses in the more distal cervical ICA. Stenosis at the ICA bulb is estimated at 70% or greater. Difficult to apply NASCET criteria with distal flow reduction. Vertebral arteries: Vertebral artery origins are patent. The  vertebral arteries are small vessels that are patent through the cervical region to the foramen magnum. Incidental note of a 40-50% stenosis of the left subclavian artery at the apex. Skeleton: Degenerative cervical spondylosis. Other neck: No mass or lymphadenopathy. Upper chest: Normal Review of the MIP images confirms the above findings CTA HEAD FINDINGS Anterior circulation: Both internal carotid arteries are patent through the skull base and siphon regions. There is severe atherosclerotic disease in the carotid siphon regions with narrowing. Both vessels appear patent through that area but are very small caliber. The anterior and middle cerebral vessels are patent but also are very small caliber. I cannot identify a proximal embolic occlusion that could be treatable with mechanical intervention. Posterior circulation: Both vertebral arteries are patent through the foramen magnum to the basilar. No basilar stenosis. Posterior circulation branch vessels show flow. Venous sinuses: Patent and normal. Anatomic variants: None significant. Delayed phase: Not performed. Review of the MIP images confirms the above findings IMPRESSION: Severe atherosclerotic disease at both carotid bifurcation and ICA bulbs, right worse than left. Luminal diameter on the right is 1 mm or less in the ICA bulb. Luminal diameter on the left is 1.5 mm in the ICA bulb. There is flow reduction in the diameter of both distal cervical internal carotid arteries, making application of NASCET criteria difficult. Stenosis is estimated at 80% or worse on the right and 70% or worse on the left. No large or medium vessel intracranial occlusion identified. Severe atherosclerotic disease in both carotid siphon regions with narrowing of both internal carotid arteries through that segment. These results were called by telephone at the time of interpretation on 06/06/2019 at 8:45 am to Dr. Carrie Mew , who verbally acknowledged these results.  Electronically Signed   By: Nelson Chimes M.D.   On: 06/06/2019 08:54   Mr Brain Wo Contrast  Result Date: 06/06/2019 CLINICAL DATA:  Left-sided weakness and numbness EXAM: MRI HEAD WITHOUT CONTRAST TECHNIQUE: Multiplanar, multiecho pulse sequences of the brain and surrounding structures were obtained without intravenous contrast. COMPARISON:  CT studies same day FINDINGS: Brain: Diffusion imaging matches well with the CT scan, showing acute cortical and subcortical infarctions in the right temporal parieto-occipital junction region and the right frontoparietal vertex. Few small scattered intervening acute infarctions. No acute infarction on the left. No acute infarction affecting the brainstem or cerebellum. Minimal petechial blood products at the right temporoparietal occipital junction region but no frank hematoma. No mass effect or shift. Mild chronic small-vessel ischemic change of the hemispheric white matter elsewhere. Vascular: Major vessels at the base of the brain show flow. Skull and upper cervical spine: Negative Sinuses/Orbits: Clear/normal Other: None IMPRESSION: Embolic infarctions in the right middle cerebral artery territory as above. Minimal petechial blood products in the  right posterior temporal occipital parietal junction region without frank hematoma. No mass effect or shift. Background pattern of mild chronic small-vessel ischemic change affecting the hemispheric white matter elsewhere. Electronically Signed   By: Nelson Chimes M.D.   On: 06/06/2019 12:51    Assessment: 68 y.o. female with no significant past medical history but no recent physician visits who presents with acute onset left sided weakness.  MRI of the brain reviewed and shows acute infarcts in the right MCA territory and right posterior temporal, occipital parietal junction petechial hemorrhage.  CTA shows 70-80% stenosis of both ICA's, right greater than left.  Etiology of infarcts  likely embolic from carotid  atherosclerosis.  Although patient without history from review of admission labs and vitals patient likely with HTN, DM and HLD as risk factors.  Echocardiogram pending.  LDL 217.  A1c pending.   During evaluation this morning patient also noted to have left facial twitching.  Per nursing earlier this involved the face and the left arm.  Concern is for simple partial status since patient is able to follow commands and speak with this jerking.  Likely related to acute infarcts.    Stroke Risk Factors - diabetes mellitus, hyperlipidemia, hypertension and smoking  Plan: 1. HgbA1c pending.  Goal A1c<7.0 2. Statin for lipid management with target LDL<70. 3. PT consult, OT consult, Speech consult 4. Echocardiogram pending 5. Vascular has evaluated patient and are recommended possible repair in 2-3 weeks.   6. Prophylactic therapy-Dual antiplatelet therapy with ASA 81mg  and Plavix 75mg .  Patient to continue while awaiting vascular evaluation 7. NPO until RN stroke swallow screen 8. Telemetry monitoring 9. Frequent neuro checks 10. Agree with BP control.  Target SBP<140. 11. Smoking cessation counseling 12.  Ativan 1mg  now and may be used prn. 13. Seizure precautions 14. Keppra 1000mg  IV now with maintenance of 500mg  BID to start tonight.    Alexis Goodell, MD Neurology [phone] 06/07/2019, 9:59 AM

## 2019-06-08 DIAGNOSIS — I63239 Cerebral infarction due to unspecified occlusion or stenosis of unspecified carotid arteries: Secondary | ICD-10-CM

## 2019-06-08 DIAGNOSIS — R569 Unspecified convulsions: Secondary | ICD-10-CM

## 2019-06-08 LAB — HIV ANTIBODY (ROUTINE TESTING W REFLEX): HIV Screen 4th Generation wRfx: NONREACTIVE

## 2019-06-08 LAB — GLUCOSE, CAPILLARY
Glucose-Capillary: 161 mg/dL — ABNORMAL HIGH (ref 70–99)
Glucose-Capillary: 235 mg/dL — ABNORMAL HIGH (ref 70–99)
Glucose-Capillary: 190 mg/dL — ABNORMAL HIGH (ref 70–99)

## 2019-06-08 MED ORDER — LIVING WELL WITH DIABETES BOOK
Freq: Once | Status: AC
  Administered 2019-06-08: 17:00:00
  Filled 2019-06-08: qty 1

## 2019-06-08 MED ORDER — LEVETIRACETAM 500 MG PO TABS
1000.0000 mg | ORAL_TABLET | Freq: Two times a day (BID) | ORAL | Status: DC
  Administered 2019-06-08 – 2019-06-09 (×2): 1000 mg via ORAL
  Filled 2019-06-08 (×3): qty 2

## 2019-06-08 MED ORDER — LEVETIRACETAM 500 MG PO TABS
500.0000 mg | ORAL_TABLET | Freq: Once | ORAL | Status: AC
  Administered 2019-06-08: 500 mg via ORAL
  Filled 2019-06-08: qty 1

## 2019-06-08 NOTE — Progress Notes (Addendum)
Inpatient Diabetes Program Recommendations  AACE/ADA: New Consensus Statement on Inpatient Glycemic Control   Target Ranges:  Prepandial:   less than 140 mg/dL      Peak postprandial:   less than 180 mg/dL (1-2 hours)      Critically ill patients:  140 - 180 mg/dL  Results for Tammy Nunez, Tammy Nunez (MRN [id number]) as of 06/08/2019 11:27  Ref. Range 06/07/2019 07:55 06/07/2019 11:51 06/07/2019 16:44 06/07/2019 21:24 06/08/2019 07:44  Glucose-Capillary Latest Ref Range: 70 - 99 mg/dL 236 (H) 208 (H) 153 (H) 193 (H) 161 (H)  Results for Tammy Nunez, Tammy Nunez (MRN [id number]) as of 06/08/2019 11:27  Ref. Range 06/06/2019 06:36 06/07/2019 08:36  Glucose Latest Ref Range: 70 - 99 mg/dL 283 (H)   Hemoglobin A1C Latest Ref Range: 4.8 - 5.6 %  9.3 (H)    Review of Glycemic Control  Diabetes history: NO Outpatient Diabetes medications: NA Current orders for Inpatient glycemic control: Lantus 8 units daily, Novolog 0-9 units TID with meals  Inpatient Diabetes Program Recommendations:   HgbA1C: A1C 9.3% on 06/07/19 indicating an average glucose of 220 mg/dl over the past 2-3 months.   NOTE: Per H&P, no DM hx noted. Initial glucose 283 mg/dl and A1C 9.3% on 06/07/19. Ordered Living Well with DM book and RD consult for diet education. Will plan to talk with patient today.  Addendum 06/08/19@14 :00-Spoke with patient about new diabetes diagnosis. Patient denies any prior history of DM. Patient notes that her mother and brother have DM. Patient has not seen a health care provider in years. Explained that initial glucose was elevated and an A1C was checked.  Discussed A1C results (9.3% on 06/07/19) and explained what an A1C is and informed patient that current A1C indicates an average glucose of 220 mg/dl over the past 2-3 months. Discussed basic pathophysiology of DM Type 2, basic home care, importance of checking CBGs and maintaining good CBG control to prevent long-term and short-term complications. Reviewed glucose and A1C  goals and explained that she will be prescribed DM medication to take as an outpatient. Reviewed signs and symptoms of hyperglycemia and hypoglycemia along with treatment for both. Discussed impact of nutrition, exercise, stress, sickness, and medications on diabetes control. Informed patient that Living Well with diabetes booklet has been ordered and she will receive from RN once pharmacy sends to unit. Encouraged patient to read through entire book. Patient states that she will be going to [facility] after hospital discharge. Explained that nursing staff will continue to provide DM education. Encouraged patient to get a PCP for consistent follow up.  Patient verbalized understanding of information discussed and she states that she has no further questions at this time related to diabetes.   RNs to provide ongoing basic DM education at bedside with this patient and engage patient to actively check blood glucose.  Thanks, Barnie Alderman, RN, MSN, CDE Diabetes Coordinator Inpatient Diabetes Program [phone] (Team Pager from 8am to 5pm)

## 2019-06-08 NOTE — Progress Notes (Signed)
Subjective: Patient awake and alert.  No facial or upper extremity twitching noted this morning, although there does appear to have been some overnight per nursing.    Objective: Current vital signs: BP (!) 164/69   Pulse 88   Temp 98.5 F (36.9 C) (Oral)   Resp 18   Ht 4\' 4"  (1.321 m)   Wt 77.1 kg   SpO2 97%   BMI 44.20 kg/m  Vital signs in last 24 hours: Temp:  [98.3 F (36.8 C)-99.3 F (37.4 C)] 98.5 F (36.9 C) (07/15 0403) Pulse Rate:  [83-95] 88 (07/15 0759) Resp:  [17-20] 18 (07/15 0759) BP: (157-179)/(57-91) 164/69 (07/15 0759) SpO2:  [96 %-99 %] 97 % (07/15 0759)  Intake/Output from previous day: 07/14 0701 - 07/15 0700 In: 96.1 [IV Piggyback:96.1] Out: -  Intake/Output this shift: No intake/output data recorded. Nutritional status:  Diet Order            DIET DYS 3 Room service appropriate? Yes with Assist; Fluid consistency: Thin  Diet effective now              Neurologic Exam: Mental Status: Alert, oriented, thought content appropriate.  Speech fluent without evidence of aphasia.  Able to follow 3 step commands without difficulty. Cranial Nerves: II: LHH III,IV, VI: ptosis not present, extra-ocular motions intact bilaterally V,VII: left facial droop, facial light touch sensation normal bilaterally.    VIII: hearing normal bilaterally IX,X: gag reflex present XI: bilateral shoulder shrug XII: midline tongue extension Motor: Right :  Upper extremity   5/5                                      Left:     Upper extremity   3-/5             Lower extremity   5/5                                                  Lower extremity   3-/5 Tone and bulk:normal tone throughout; no atrophy noted Sensory: Pinprick and light touch intact throughout, bilaterally   Lab Results: Basic Metabolic Panel: Recent Labs  Lab 06/06/19 0636  NA 138  K 3.6  CL 102  CO2 20*  GLUCOSE 283*  BUN 12  CREATININE 0.94  CALCIUM 9.8    Liver Function Tests: Recent Labs   Lab 06/06/19 0636  AST 26  ALT 22  ALKPHOS 86  BILITOT 0.9  PROT 8.1  ALBUMIN 4.7   No results for input(s): LIPASE, AMYLASE in the last 168 hours. No results for input(s): AMMONIA in the last 168 hours.  CBC: Recent Labs  Lab 06/06/19 0636  WBC 9.6  NEUTROABS 5.8  HGB 16.3*  HCT 46.5*  MCV 87.2  PLT 211    Cardiac Enzymes: No results for input(s): CKTOTAL, CKMB, CKMBINDEX, TROPONINI in the last 168 hours.  Lipid Panel: Recent Labs  Lab 06/07/19 0836  CHOL 305*  TRIG 244*  HDL 39*  CHOLHDL 7.8  VLDL 49*  LDLCALC 217*    CBG: Recent Labs  Lab 06/07/19 0755 06/07/19 1151 06/07/19 1644 06/07/19 2124 06/08/19 0744  GLUCAP 236* 208* 153* 193* 161*    Microbiology: Results for orders placed or performed during the  hospital encounter of 06/06/19  SARS Coronavirus 2 (CEPHEID - Performed in [place] hospital lab), Hosp Order     Status: None   Collection Time: 06/06/19  7:51 AM   Specimen: Nasopharyngeal Swab  Result Value Ref Range Status   SARS Coronavirus 2 NEGATIVE NEGATIVE Final    Comment: (NOTE) If result is NEGATIVE SARS-CoV-2 target nucleic acids are NOT DETECTED. The SARS-CoV-2 RNA is generally detectable in upper and lower  respiratory specimens during the acute phase of infection. The lowest  concentration of SARS-CoV-2 viral copies this assay can detect is 250  copies / mL. A negative result does not preclude SARS-CoV-2 infection  and should not be used as the sole basis for treatment or other  patient management decisions.  A negative result may occur with  improper specimen collection / handling, submission of specimen other  than nasopharyngeal swab, presence of viral mutation(s) within the  areas targeted by this assay, and inadequate number of viral copies  (<250 copies / mL). A negative result must be combined with clinical  observations, patient history, and epidemiological information. If result is POSITIVE SARS-CoV-2 target  nucleic acids are DETECTED. The SARS-CoV-2 RNA is generally detectable in upper and lower  respiratory specimens dur ing the acute phase of infection.  Positive  results are indicative of active infection with SARS-CoV-2.  Clinical  correlation with patient history and other diagnostic information is  necessary to determine patient infection status.  Positive results do  not rule out bacterial infection or co-infection with other viruses. If result is PRESUMPTIVE POSTIVE SARS-CoV-2 nucleic acids MAY BE PRESENT.   A presumptive positive result was obtained on the submitted specimen  and confirmed on repeat testing.  While 2019 novel coronavirus  (SARS-CoV-2) nucleic acids may be present in the submitted sample  additional confirmatory testing may be necessary for epidemiological  and / or clinical management purposes  to differentiate between  SARS-CoV-2 and other Sarbecovirus currently known to infect humans.  If clinically indicated additional testing with an alternate test  methodology [phone]) is advised. The SARS-CoV-2 RNA is generally  detectable in upper and lower respiratory sp ecimens during the acute  phase of infection. The expected result is Negative. Fact Sheet for Patients:  StrictlyIdeas.no Fact Sheet for Healthcare Providers: BankingDealers.co.za This test is not yet approved or cleared by the [place] FDA and has been authorized for detection and/or diagnosis of SARS-CoV-2 by FDA under an Emergency Use Authorization (EUA).  This EUA will remain in effect (meaning this test can be used) for the duration of the COVID-19 declaration under Section 564(b)(1) of the Act, 21 U.S.C. section 360bbb-3(b)(1), unless the authorization is terminated or revoked sooner. Performed at [facility], [address] [place], [place] [address]     Coagulation Studies: Recent Labs    06/06/19 0636  LABPROT 13.0  INR  1.0    Imaging: Mr Brain Wo Contrast  Result Date: 06/06/2019 CLINICAL DATA:  Left-sided weakness and numbness EXAM: MRI HEAD WITHOUT CONTRAST TECHNIQUE: Multiplanar, multiecho pulse sequences of the brain and surrounding structures were obtained without intravenous contrast. COMPARISON:  CT studies same day FINDINGS: Brain: Diffusion imaging matches well with the CT scan, showing acute cortical and subcortical infarctions in the right temporal parieto-occipital junction region and the right frontoparietal vertex. Few small scattered intervening acute infarctions. No acute infarction on the left. No acute infarction affecting the brainstem or cerebellum. Minimal petechial blood products at the right temporoparietal occipital junction region but  no frank hematoma. No mass effect or shift. Mild chronic small-vessel ischemic change of the hemispheric white matter elsewhere. Vascular: Major vessels at the base of the brain show flow. Skull and upper cervical spine: Negative Sinuses/Orbits: Clear/normal Other: None IMPRESSION: Embolic infarctions in the right middle cerebral artery territory as above. Minimal petechial blood products in the right posterior temporal occipital parietal junction region without frank hematoma. No mass effect or shift. Background pattern of mild chronic small-vessel ischemic change affecting the hemispheric white matter elsewhere. Electronically Signed   By: Nelson Chimes M.D.   On: 06/06/2019 12:51    Medications:  I have reviewed the patient's current medications. Scheduled: . aspirin EC  81 mg Oral Daily  . atorvastatin  40 mg Oral q1800  . clopidogrel  75 mg Oral Daily  . enoxaparin (LOVENOX) injection  40 mg Subcutaneous Daily  . insulin aspart  0-9 Units Subcutaneous TID WC  . insulin glargine  8 Units Subcutaneous Daily  . levETIRAcetam  1,000 mg Oral BID  . levETIRAcetam  500 mg Oral Once  . lisinopril  10 mg Oral Daily    Assessment/Plan: 68 year old with acute  right hemispheric embolic infarcts and bilateral carotid stenosis.  Also noted to have simple partial seizures.  Started on Keppra.  Some breakthrough noted overnight.  Patient started on ASA, Plavix and statin.  BP remains slightly elevated.  A1c 9.3.  Recommendations: 1. Agree with continuation of ASA, Plavix and statin 2. Blood sugar management with target A1c<7.0.  Diabetes education. 3. Frequent neuro checks 4. Telemetry 5. Increase Keppra to 1000mg  BID.  500mg  dose given now with increase in maintenance to start with evening dose.   6. Continue seizure precautions 7. BP management with target SBP<140.      LOS: 1 day   Alexis Goodell, MD Neurology [phone] 06/08/2019  10:19 AM

## 2019-06-08 NOTE — Evaluation (Signed)
Speech Language Pathology Evaluation Patient Details Name: Tammy Nunez MRN: [id number] DOB: 05-05-1951 Today's Date: 06/08/2019 Time: 4098-1191 SLP Time Calculation (min) (ACUTE ONLY): 45 min  Problem List:  Patient Active Problem List   Diagnosis Date Noted  . Acute CVA (cerebrovascular accident) ([place]) 06/06/2019   Past Medical History: History reviewed. No pertinent past medical history. Past Surgical History:  Past Surgical History:  Procedure Laterality Date  . ECTOPIC PREGNANCY SURGERY     HPI:  Pt is a 68 yo female w/ current tobacco use presented to the ED on 7/13 with U/L Left sided numbness and weakness. MRI positive for acute embolic infarctions in the R MCA territory and with minimal petechial blood products in the R posterior temporal occipital parietal junction. CT angio reveals severe carotid stenosis estimated at 80% or worse on the right and 70% or worse on the left. Per vascular consult, in setting of acute CVA, recommend waiting at least 2-3 weeks before open endarterectomy vs stenting.  Per NSG this AM, pt noted to have left facial and arm twitching. Per MD, concern is for simple partial status since patient is able to follow commands and speak with this jerking. Likely related to acute infarcts per Neurology note.    Assessment / Plan / Recommendation Clinical Impression  Pt appears to present w/ Min+ deficits in Cognitive-communication abilities though fairly functional overall in general conversation. She is able to verbalize general wants/needs appropriately to staff, others. Pt is Moderately+ impacted by her Left Neglect and decreased awareness of Left side - she responds to verbal cues and is able to utilize tactile strategies to search her environment on her Left side. Pt does present w/ a preference for R head turn and gaze. During Cognitive-linguistic tasks, pt demonstrated appropriate Orientation able to describe the incident leading to her hospital admission.  She answered general questions re: self and followed 1-2 step commands appropriately; w/ 3+ steps, she needed repetition and min time. Pt exhibited decreased accuracy w/ recall of new information, tasks of abstraction and fluency, and needed min time (intermittently) when asked for a more full response/elaboration of object/item. Her attention to task and follow through was appropriate. No apparent Receptive or Expressive language deficits noted during tasks at this assessment. No gross Motor Speech deficits noted; she does have a slightly swollen lip d/t new stitches s/p fall. Lingual/labial strength/ROM was wfl; No unilateral weakness noted. Min oral sensory deficits noted w/ min food residue on Left side of tongue during meal. Speech 100% intelligible but slight decreased labial articulation/precision noted during conversation(stitches and swelling?). Of note, several visual tasks were not assessed during this bedside evaluation d/t pt's Left Neglect and decreased awareness on her Left side. Pt was able to turn head to the Left and identify objects given cues to search and find using hand and vision; noted a continued R gaze preference - it seemed difficult for pt to maintain a Left head turn/gaze to SLP sitting on her Left side during assessment.     SLP Assessment  SLP Recommendation/Assessment: All further Speech Lanaguage Pathology  needs can be addressed in the next venue of care SLP Visit Diagnosis: Cognitive communication deficit (R41.841)(to be further assessed, treated)    Follow Up Recommendations  Inpatient Rehab    Frequency and Duration (TBD at next venue of care)  (TBD at next venue of care)      SLP Evaluation Cognition  Overall Cognitive Status: Impaired/Different from baseline Arousal/Alertness: Awake/alert Orientation Level: Oriented X4 Attention: Focused;Sustained  Focused Attention: Appears intact Sustained Attention: Appears intact Memory: Appears intact Awareness:  Appears intact Problem Solving: Appears intact(grossly for verbal tasks) Executive Function: Reasoning;Self Monitoring;Self Correcting Reasoning: Appears intact Self Monitoring: Appears intact Self Correcting: Appears intact Behaviors: (Left neglect, decreased awareness) Safety/Judgment: Appears intact(in discussion)       Comprehension  Auditory Comprehension Overall Auditory Comprehension: Appears within functional limits for tasks assessed Yes/No Questions: Within Functional Limits Commands: Within Functional Limits Conversation: Complex Interfering Components: (none) Visual Recognition/Discrimination Discrimination: Not tested Reading Comprehension Reading Status: Not tested    Expression Expression Primary Mode of Expression: Verbal Verbal Expression Overall Verbal Expression: Appears within functional limits for tasks assessed Initiation: No impairment Automatic Speech: Name;Social Response;Counting;Day of week Level of Generative/Spontaneous Verbalization: Sentence Repetition: No impairment Naming: No impairment Pragmatics: No impairment Non-Verbal Means of Communication: Not applicable Written Expression Dominant Hand: Right Written Expression: Not tested   Oral / Motor  Oral Motor/Sensory Function Overall Oral Motor/Sensory Function: Moderate impairment Facial ROM: Reduced left(twitching) Facial Symmetry: Abnormal symmetry left(twitching) Facial Strength: Reduced left Facial Sensation: Within Functional Limits Lingual ROM: Reduced left Lingual Symmetry: Abnormal symmetry left(full deviation to left ) Lingual Strength: Reduced(min) Lingual Sensation: Reduced(Left) Velum: Within Functional Limits Mandible: Within Functional Limits Motor Speech Overall Motor Speech: Appears within functional limits for tasks assessed Respiration: Within functional limits Phonation: Normal Resonance: Within functional limits Articulation: Within functional limitis(grossly -  stitches in lip) Intelligibility: Intelligible Motor Planning: Witnin functional limits Motor Speech Errors: Not applicable   GO                      Orinda Kenner, MS, CCC-SLP , 06/08/2019, 3:17 PM

## 2019-06-08 NOTE — Progress Notes (Signed)
Inpatient Rehabilitation Admissions Coordinator  I await updated therapy assessments today and then I will contact patient and family to begin discussions concerning a possible inpt rehab admission pending insurance approval with [employer].  Danne Baxter, RN, MSN Rehab Admissions Coordinator [phone] 06/08/2019 11:23 AM

## 2019-06-08 NOTE — Progress Notes (Addendum)
Inpatient Rehabilitation Admissions Coordinator  I have just received updated P.T. assessment from today. I contacted patient by phone for rehab assessment, as well as discuss goals and expectations of an inpt rehab admission at [facility]. She states she was working, driving and independent pta. Her spouse is retired and can assist as needed. Her sister Helene Kelp, lives in [place] which I left a voicemail for her to call me to discuss rehab venue at [facility]. Patient prefers an inpt rehab admission. I await OT updates from today and then will begin insurance authorization with [employer] for a possible inpt rehab admit Thursday pending insurance approval. I will contact pt's spouse to also discuss. SW, Mel Almond given update.Patient is an excellent candidate for intensive inpt rehab.  Danne Baxter, RN, MSN Rehab Admissions Coordinator [phone] 06/08/2019 2:57 PM   I opened case with insurance at 3 pm today and also spoke with pt's sister by phone. Sister and family are hopeful for approval by insurance for [facility] inpt rehab admit tomorrow.  Danne Baxter, RN, MSN Rehab Admissions Coordinator [phone] 06/08/2019 6:42 PM

## 2019-06-08 NOTE — Progress Notes (Signed)
  Speech Language Pathology Treatment: Dysphagia  Patient Details Name: Tammy Nunez MRN: [id number] DOB: Sep 08, 1951 Today's Date: 06/08/2019 Time: 4010-2725 SLP Time Calculation (min) (ACUTE ONLY): 45 min  Assessment / Plan / Recommendation Clinical Impression  Pt seen today for ongoing assessment of toleration of diet; need for strategies to address Left oral weakness and attention/awareness. Pt verbalized communicated w/ SLP in conversation re: self, meal. She was much more awake than at eval yesterday(Ativan then). Pt continues to present w/ Left side Neglect and decreased awareness - she does respond well to verbal/tactile cues and encouragement to engaged to Left side though it seems fatiguing to do so for increased periods of time.  Recommend continue the [place] consistency for meats being cut; soft foods - pt is eating w/out Denture plate in place per her request/ok("I do not always wear it"). Pt also needs a more prepped tray/meal d/t inability to use LUE secondary to weakness s/p CVA. Pt exhibits min oral phase deficits including decreased oral/labial sensation on Left side. Recommend general aspiration precautions; tray setup and prep w/ assistance as needed. Recommend pills in Puree if needed for easier bolus cohesion and safety w/ swallowing. OMEs reviewed; rec. f/u w/ further lingual exs, education at Rehab. NSG updated.      HPI HPI: Pt is a 68 yo female w/ current tobacco use presented to the ED on 7/13 with U/L Left sided numbness and weakness. MRI positive for acute embolic infarctions in the R MCA territory and with minimal petechial blood products in the R posterior temporal occipital parietal junction. CT angio reveals severe carotid stenosis estimated at 80% or worse on the right and 70% or worse on the left. Per vascular consult, in setting of acute CVA, recommend waiting at least 2-3 weeks before open endarterectomy vs stenting.  Per NSG this AM, pt noted to have left  facial and arm twitching. Per MD, concern is for simple partial status since patient is able to follow commands and speak with this jerking. Likely related to acute infarcts per Neurology note.       SLP Plan  Continue with current plan of care  All further Speech Lanaguage Pathology  needs can be addressed in the next venue of care    Recommendations  Diet recommendations: Dysphagia 3 (mechanical soft);Thin liquid Liquids provided via: Cup;Straw(monitor) Medication Administration: Whole meds with puree(as needed for easier, safer swallowing) Supervision: Patient able to self feed;Staff to assist with self feeding;Intermittent supervision to cue for compensatory strategies(d/t Left Neglect) Compensations: Minimize environmental distractions;Slow rate;Small sips/bites;Lingual sweep for clearance of pocketing;Monitor for anterior loss;Follow solids with liquid Postural Changes and/or Swallow Maneuvers: Seated upright 90 degrees;Upright 30-60 min after meal                Oral Care Recommendations: Oral care BID;Oral care before and after PO;Patient independent with oral care;Staff/trained caregiver to provide oral care(assist) Follow up Recommendations: Inpatient Rehab SLP Visit Diagnosis: Dysphagia, oral phase (R13.11) Plan: Continue with current plan of care       [place], [place], CCC-SLP Watson,Katherine 06/08/2019, 3:22 PM

## 2019-06-08 NOTE — Progress Notes (Addendum)
Occupational Therapy Treatment Patient Details Name: Tammy Nunez MRN: [id number] DOB: [birth year] Today's Date: 06/08/2019    History of present illness Pt. is a 68 y.o. female who presented to the ED on 7/13 with L sided numbness and weakness. MRI positive for acute embolic infarctions in the R MCA territory and with minimal petechial blood products in the R posterior temporal occipital parietal junction. CT angio reveals severe carotid stenosis estimated at 80% or worse on the right and 70% or worse on the left. Per vascular consult, in setting of acute CVA, recommend waiting at least 2-3 weeks before open endarterectomy vs stenting.   OT comments  Pt. tolerated PROM through all joint ranges of the LUE, and hand. Pt. performed hand to face patterns with max hand-over-hand assist. Limited active responses facilitated proximally, no active movement facilitated distally. Limited scanning to the left. Pt. required modA repositioning in the chair, and assisted with placement of pillows. Pt. continues to benefit from OT services for ADL training, A/E training, neuromuscular re-education, and pt. education about home modification, and DME.  Pt. would benefit from CIR with follow-up OT services to maximize independence, and improve function with ADLs, and IADLs.   Follow Up Recommendations  CIR    Equipment Recommendations  3 in 1 bedside commode    Recommendations for Other Services Rehab consult    Precautions / Restrictions Precautions Precautions: Fall Restrictions Weight Bearing Restrictions: No       Mobility Bed Mobility Overal bed mobility: Needs Assistance Bed Mobility: Supine to Sit     Supine to sit: Mod assist     General bed mobility comments: transition towards L side of bed; relies heavily on R hemi-body and environment to assist with movement transition  Transfers Overall transfer level: Needs assistance   Transfers: Sit to/from Stand Sit to Stand: Mod assist;+2  safety/equipment         General transfer comment: Mobility per PT report    Balance Overall balance assessment: Needs assistance Sitting-balance support: No upper extremity supported;Feet supported Sitting balance-Leahy Scale: Fair S   Standing balance support: Bilateral upper extremity supported Standing balance-Leahy Scale: Poor                         ADL either performed or assessed with clinical judgement   ADL Overall ADL's : Needs assistance/impaired     Grooming: Sitting;Set up;Min guard   Upper Body Bathing: Moderate assistance   Lower Body Bathing: Moderate assistance;Sitting/lateral leans   Upper Body Dressing : Sitting;Moderate assistance   Lower Body Dressing: Sitting/lateral leans;Moderate assistance   Toilet Transfer: BSC;Stand-pivot;Moderate assistance                   Vision  Tracking/Visual Pursuits: Impaired - to be further tested in functional context   Perception     Praxis      Cognition Arousal/Alertness: Awake/alert Behavior During Therapy: WFL for tasks assessed/performed Overall Cognitive Status: Impaired/Different from baseline Area of Impairment: Safety/judgement;Following commands;Problem solving                       Following Commands: Follows multi-step commands with increased time Safety/Judgement: Decreased awareness of deficits;Decreased awareness of safety   Problem Solving: Requires verbal cues;Requires tactile cues General Comments: oriented to self, situation, location; follows commands and verbalizes awareness of deficits, limitations and safety needs.  Limited functional insight into deficits, limited self-initiation of compensatory or recovery strategies  Exercises    Shoulder Instructions       General Comments Pt. presents with limited BUE strength    Pertinent Vitals/ Pain       Pain Assessment: No/denies pain  Home Living     Available Help at Discharge:  Family;Available 24 hours/day Type of Home: House                              Lives With: Spouse    Prior Functioning/Environment              Frequency  Min 3X/week        Progress Toward Goals  OT Goals(current goals can now be found in the care plan section)  Progress towards OT goals: Progressing toward goals  Acute Rehab OT Goals Patient Stated Goal: to get better OT Goal Formulation: With patient Time For Goal Achievement: 06/21/19 Potential to Achieve Goals: Good  Plan      Co-evaluation                 AM-PAC OT "6 Clicks" Daily Activity     Outcome Measure   Help from another person eating meals?: A Little Help from another person taking care of personal grooming?: A Little Help from another person toileting, which includes using toliet, bedpan, or urinal?: A Lot Help from another person bathing (including washing, rinsing, drying)?: A Lot Help from another person to put on and taking off regular upper body clothing?: A Lot Help from another person to put on and taking off regular lower body clothing?: A Lot 6 Click Score: 14    End of Session Equipment Utilized During Treatment: Gait belt;Rolling walker  OT Visit Diagnosis: Other abnormalities of gait and mobility (R26.89);Hemiplegia and hemiparesis;History of falling (Z91.81);Other symptoms and signs involving cognitive function Hemiplegia - Right/Left: Left Hemiplegia - dominant/non-dominant: Non-Dominant Hemiplegia - caused by: Cerebral infarction   Activity Tolerance Patient tolerated treatment well   Patient Left in bed;with call bell/phone within reach;with bed alarm set   Nurse Communication          Time: 3662-9476 OT Time Calculation (min): 30 min  Charges: OT General Charges $OT Visit: 1 Visit OT Treatments $Self Care/Home Management : 23-37 mins  Harrel Carina, MS, OTR/L  Harrel Carina 06/08/2019, 5:00 PM

## 2019-06-08 NOTE — Progress Notes (Signed)
Physical Therapy Treatment Patient Details Name: Tammy Nunez MRN: [id number] DOB: 07-19-1951 Today's Date: 06/08/2019    History of Present Illness 68yo female presented to the ED on 7/13 with L sided numbness and weakness. MRI positive for acute embolic infarctions in the R MCA territory and with minimal petechial blood products in the R posterior temporal occipital parietal junction. CT angio reveals severe carotid stenosis estimated at 80% or worse on the right and 70% or worse on the left. Per vascular consult, in setting of acute CVA, recommend waiting at least 2-3 weeks before open endarterectomy vs stenting.    PT Comments    Marked improvement in alertness and ability to actively participate with session this date; very eager to participate/progress as able.  Excellent effort with all functional activities throughout session despite increase in intensity/challenge presented today. Able to maintain unsupported sitting balance with sup/min assist; lists/pushes L with fatigue or divided attention.  Does demonstrates ability to recognize and self-correct balance losses 50% session; pushing behaviors noted with increased fatigue/challenge.   Did note increased weakness to L UE this date; very minimal/no active movement or control this date (unchanged with position or facilitation). Completed sit/stand and OOB to chair with mod assist +2, incorporating postural control, core activation and weight shift/midline activities as appropriate.  Significant pushing behavior to L with gait attempts.  Constant manual cuing for trunk, hip and knee control.  With adequate weight shift, able to unweight/advance L LE, but requires constant cuing for position of L foot/LE in loading.  High risk for injury to L ankle due to lateral weakness/instability; may benefit from trial of AFO with additional gait trials.    Follow Up Recommendations  CIR     Equipment Recommendations       Recommendations for  Other Services       Precautions / Restrictions Precautions Precautions: Fall Restrictions Weight Bearing Restrictions: No    Mobility  Bed Mobility Overal bed mobility: Needs Assistance Bed Mobility: Supine to Sit     Supine to sit: Mod assist     General bed mobility comments: transition towards L side of bed; relies heavily on R hemi-body and environment to assist with movement transition  Transfers Overall transfer level: Needs assistance   Transfers: Sit to/from Stand Sit to Stand: Mod assist;+2 safety/equipment         General transfer comment: assist for L UE/LE position and protection; mod manual facilitation L hip and knee in stance, manual cuing for midline and R ant/lateral weight shift throughout  Ambulation/Gait Ambulation/Gait assistance: Mod assist;+2 physical assistance Gait Distance (Feet): 5 Feet Assistive device: 2 person hand held assist       General Gait Details: heavy mod assist +2 for R anterior/lateral weight shift to unweight/advance L LE (heavy pushing to L with gait attempts).  With adequate weight shift, able to unweight and advance L LE, but requires assist for placement and position (high risk for lateral ankle injury on L due to lateral instability).  Mod/max assist for pelvic, hip and knee control with dynamic activities.   Stairs             Wheelchair Mobility    Modified Rankin (Stroke Patients Only)       Balance Overall balance assessment: Needs assistance Sitting-balance support: No upper extremity supported;Feet supported Sitting balance-Leahy Scale: Fair Sitting balance - Comments: maintains midline with close sup during initial sitting tasks; regresses to min assist with fatigue or divided attention.  Does demonstrate attempts  at self-correction (indep initiated) 50% session, improved from previous date.   Standing balance support: Bilateral upper extremity supported Standing balance-Leahy Scale: Poor Standing  balance comment: constant manual facilitation at L pelvis, hip and knee for extension, neutral position in closed-chain                            Cognition Arousal/Alertness: Awake/alert Behavior During Therapy: [facility] [place] for tasks assessed/performed                                   General Comments: oriented to self, situation, location; follows commands and verbalizes awareness of deficits, limitations and safety needs.  Limited functional insight into deficits, limited self-initiation of compensatory or recovery strategies      Exercises Other Exercises Other Exercises: Standing with bilat UEs propped on elevated bedside table, mod asist +1-2, performed R UE dynamic reaching tasks (matching letters of the alphabet) across table surface-emphasis on visual awareness and scanning to L of midline (min/mod cuing), R ant/lateral weight shift (with modified D2 extension reaching).  Able to self-initiate L hip/knee extension in isolation, but unable to maintain with divided attention.  Significant L anterior/lateral trunk weakness in upright position, mod/max manual cuing for facilitation and positioning.    General Comments        Pertinent Vitals/Pain Pain Assessment: No/denies pain    Home Living                      Prior Function            PT Goals (current goals can now be found in the care plan section) Acute Rehab PT Goals Patient Stated Goal: to get better PT Goal Formulation: With patient Time For Goal Achievement: 06/21/19 Potential to Achieve Goals: Good Progress towards PT goals: Progressing toward goals    Frequency    7X/week      PT Plan Current plan remains appropriate    Co-evaluation              AM-PAC PT "6 Clicks" Mobility   Outcome Measure  Help needed turning from your back to your side while in a flat bed without using bedrails?: A Little Help needed moving from lying on your back to sitting on the side of a  flat bed without using bedrails?: A Lot Help needed moving to and from a bed to a chair (including a wheelchair)?: A Lot Help needed standing up from a chair using your arms (e.g., wheelchair or bedside chair)?: Total Help needed to walk in hospital room?: Total Help needed climbing 3-5 steps with a railing? : Total 6 Click Score: 10    End of Session Equipment Utilized During Treatment: Gait belt Activity Tolerance: Patient tolerated treatment well Patient left: in chair;with call bell/phone within reach;with chair alarm set Nurse Communication: Mobility status PT Visit Diagnosis: Other abnormalities of gait and mobility (R26.89);Unsteadiness on feet (R26.81);Muscle weakness (generalized) (M62.81);Hemiplegia and hemiparesis Hemiplegia - Right/Left: Left Hemiplegia - dominant/non-dominant: Non-dominant Hemiplegia - caused by: Cerebral infarction     Time: 8119-1478 PT Time Calculation (min) (ACUTE ONLY): 36 min  Charges:  $Neuromuscular Re-education: 23-37 mins                      H. Owens Shark, PT, DPT, NCS 06/08/19, 2:45 PM [phone]

## 2019-06-08 NOTE — Progress Notes (Signed)
[place] [facility] [place] NAME: Tammy Nunez    MR#:  [id number]  DATE OF BIRTH:  05-31-1951  SUBJECTIVE:  CHIEF COMPLAINT:   Chief Complaint  Patient presents with  . Numbness   Continues to have weakness on the left side.  Twitching of left side and her left eye improved  REVIEW OF SYSTEMS:    Review of Systems  Constitutional: Positive for malaise/fatigue. Negative for chills and fever.  HENT: Negative for sore throat.   Eyes: Negative for blurred vision, double vision and pain.  Respiratory: Negative for cough, hemoptysis, shortness of breath and wheezing.   Cardiovascular: Negative for chest pain, palpitations, orthopnea and leg swelling.  Gastrointestinal: Negative for abdominal pain, constipation, diarrhea, heartburn, nausea and vomiting.  Genitourinary: Negative for dysuria and hematuria.  Musculoskeletal: Negative for back pain and joint pain.  Skin: Negative for rash.  Neurological: Positive for focal weakness. Negative for sensory change, speech change and headaches.  Endo/Heme/Allergies: Does not bruise/bleed easily.  Psychiatric/Behavioral: Negative for depression. The patient is not nervous/anxious.     DRUG ALLERGIES:  No Known Allergies  VITALS:  Blood pressure (!) 159/82, pulse 85, temperature 98.5 F (36.9 C), temperature source Oral, resp. rate 18, height 4\' 4"  (1.321 m), weight 77.1 kg, SpO2 100 %.  PHYSICAL EXAMINATION:   Physical Exam  GENERAL:  68 y.o.-year-old patient lying in the bed with no acute distress.  EYES: Pupils equal, round, reactive to light and accommodation. No scleral icterus. Extraocular muscles intact.  HEENT: Head atraumatic, normocephalic. Oropharynx and nasopharynx clear.  NECK:  Supple, no jugular venous distention. No thyroid enlargement, no tenderness.  LUNGS: Normal breath sounds bilaterally, no wheezing, rales, rhonchi. No use of accessory muscles of respiration.  CARDIOVASCULAR:  S1, S2 normal. No murmurs, rubs, or gallops.  ABDOMEN: Soft, nontender, nondistended. Bowel sounds present. No organomegaly or mass.  EXTREMITIES: No cyanosis, clubbing or edema b/l.    NEUROLOGIC: Cranial nerves II through XII are intact. Left weakness PSYCHIATRIC: The patient is alert and oriented x 3.  SKIN: No obvious rash, lesion, or ulcer.   LABORATORY PANEL:   CBC Recent Labs  Lab 06/06/19 0636  WBC 9.6  HGB 16.3*  HCT 46.5*  PLT 211   ------------------------------------------------------------------------------------------------------------------ Chemistries  Recent Labs  Lab 06/06/19 0636  NA 138  K 3.6  CL 102  CO2 20*  GLUCOSE 283*  BUN 12  CREATININE 0.94  CALCIUM 9.8  AST 26  ALT 22  ALKPHOS 86  BILITOT 0.9   ------------------------------------------------------------------------------------------------------------------  Cardiac Enzymes No results for input(s): TROPONINI in the last 168 hours. ------------------------------------------------------------------------------------------------------------------  RADIOLOGY:  No results found.   ASSESSMENT AND PLAN:   * Acute cortical and subcortical infarctions in the right temporal parieto-occipital junction region and the right frontoparietal vertex Aspirin, Plavix, statin Echocardiogram with nothing acute CTA showed bilateral carotid stenosis PT/OT evaluation.  Inpatient rehab  *Partial seizures.  Started on Keppra.  *Bilateral carotid stenosis.  On aspirin, Plavix, statin.  Vascular surgery has evaluated patient and will follow-up as outpatient for intervention.  *Hypertension.  Started lisinopril.  *Diabetes mellitus.  Hemoglobin A1c of 9.3.  On sliding scale insulin.  DVT prophylaxis with Lovenox  All the records are reviewed and case discussed with Care Management/Social Worker Management plans discussed with the patient, family and they are in agreement.  CODE STATUS: FULL  CODE  TOTAL TIME TAKING CARE OF THIS PATIENT: 35 minutes.   POSSIBLE D/C IN 1-2 DAYS,  DEPENDING ON CLINICAL CONDITION.  Tammy Nunez  M.D on 06/08/2019 at 1:21 PM  Between 7am to 6pm - Pager - [phone]  After 6pm go to [email or web address] - password [email or web address] [facility]  Office  [phone]  CC: Primary care physician; Patient, No Pcp Per  Note: This dictation was prepared with Dragon dictation along with smaller phrase technology. Any transcriptional errors that result from this process are unintentional.

## 2019-06-09 ENCOUNTER — Encounter: Payer: Self-pay | Admitting: Physical Medicine and Rehabilitation

## 2019-06-09 ENCOUNTER — Other Ambulatory Visit: Payer: Self-pay

## 2019-06-09 ENCOUNTER — Encounter (HOSPITAL_COMMUNITY): Payer: Self-pay

## 2019-06-09 ENCOUNTER — Inpatient Hospital Stay (HOSPITAL_COMMUNITY)
Admission: RE | Admit: 2019-06-09 | Discharge: 2019-07-01 | DRG: 057 | Disposition: A | Discharge status: Home Health | Payer: PPO | Source: Other Acute Inpatient Hospital | Attending: Physical Medicine & Rehabilitation | Admitting: Physical Medicine & Rehabilitation

## 2019-06-09 DIAGNOSIS — R197 Diarrhea, unspecified: Secondary | ICD-10-CM | POA: Diagnosis not present

## 2019-06-09 DIAGNOSIS — E876 Hypokalemia: Secondary | ICD-10-CM | POA: Diagnosis present

## 2019-06-09 DIAGNOSIS — I69354 Hemiplegia and hemiparesis following cerebral infarction affecting left non-dominant side: Principal | ICD-10-CM

## 2019-06-09 DIAGNOSIS — I69322 Dysarthria following cerebral infarction: Secondary | ICD-10-CM

## 2019-06-09 DIAGNOSIS — G4089 Other seizures: Secondary | ICD-10-CM | POA: Diagnosis present

## 2019-06-09 DIAGNOSIS — R7309 Other abnormal glucose: Secondary | ICD-10-CM

## 2019-06-09 DIAGNOSIS — F17210 Nicotine dependence, cigarettes, uncomplicated: Secondary | ICD-10-CM | POA: Diagnosis not present

## 2019-06-09 DIAGNOSIS — Z8249 Family history of ischemic heart disease and other diseases of the circulatory system: Secondary | ICD-10-CM | POA: Diagnosis not present

## 2019-06-09 DIAGNOSIS — N179 Acute kidney failure, unspecified: Secondary | ICD-10-CM

## 2019-06-09 DIAGNOSIS — M25512 Pain in left shoulder: Secondary | ICD-10-CM | POA: Diagnosis not present

## 2019-06-09 DIAGNOSIS — R414 Neurologic neglect syndrome: Secondary | ICD-10-CM | POA: Diagnosis present

## 2019-06-09 DIAGNOSIS — Z79899 Other long term (current) drug therapy: Secondary | ICD-10-CM | POA: Diagnosis not present

## 2019-06-09 DIAGNOSIS — G8929 Other chronic pain: Secondary | ICD-10-CM | POA: Diagnosis present

## 2019-06-09 DIAGNOSIS — R0989 Other specified symptoms and signs involving the circulatory and respiratory systems: Secondary | ICD-10-CM

## 2019-06-09 DIAGNOSIS — G8114 Spastic hemiplegia affecting left nondominant side: Secondary | ICD-10-CM | POA: Diagnosis not present

## 2019-06-09 DIAGNOSIS — E1165 Type 2 diabetes mellitus with hyperglycemia: Secondary | ICD-10-CM | POA: Diagnosis not present

## 2019-06-09 DIAGNOSIS — R569 Unspecified convulsions: Secondary | ICD-10-CM | POA: Diagnosis not present

## 2019-06-09 DIAGNOSIS — E119 Type 2 diabetes mellitus without complications: Secondary | ICD-10-CM | POA: Diagnosis not present

## 2019-06-09 DIAGNOSIS — I6523 Occlusion and stenosis of bilateral carotid arteries: Secondary | ICD-10-CM | POA: Diagnosis present

## 2019-06-09 DIAGNOSIS — E118 Type 2 diabetes mellitus with unspecified complications: Secondary | ICD-10-CM

## 2019-06-09 DIAGNOSIS — Z7902 Long term (current) use of antithrombotics/antiplatelets: Secondary | ICD-10-CM

## 2019-06-09 DIAGNOSIS — I6931 Attention and concentration deficit following cerebral infarction: Secondary | ICD-10-CM | POA: Diagnosis not present

## 2019-06-09 DIAGNOSIS — Z7982 Long term (current) use of aspirin: Secondary | ICD-10-CM | POA: Diagnosis not present

## 2019-06-09 DIAGNOSIS — R799 Abnormal finding of blood chemistry, unspecified: Secondary | ICD-10-CM

## 2019-06-09 DIAGNOSIS — I10 Essential (primary) hypertension: Secondary | ICD-10-CM | POA: Diagnosis not present

## 2019-06-09 DIAGNOSIS — Z6831 Body mass index (BMI) 31.0-31.9, adult: Secondary | ICD-10-CM

## 2019-06-09 DIAGNOSIS — R32 Unspecified urinary incontinence: Secondary | ICD-10-CM | POA: Diagnosis not present

## 2019-06-09 DIAGNOSIS — Z833 Family history of diabetes mellitus: Secondary | ICD-10-CM

## 2019-06-09 DIAGNOSIS — E785 Hyperlipidemia, unspecified: Secondary | ICD-10-CM | POA: Diagnosis not present

## 2019-06-09 DIAGNOSIS — I63511 Cerebral infarction due to unspecified occlusion or stenosis of right middle cerebral artery: Secondary | ICD-10-CM | POA: Diagnosis present

## 2019-06-09 DIAGNOSIS — Z794 Long term (current) use of insulin: Secondary | ICD-10-CM | POA: Diagnosis not present

## 2019-06-09 DIAGNOSIS — I639 Cerebral infarction, unspecified: Secondary | ICD-10-CM | POA: Diagnosis not present

## 2019-06-09 DIAGNOSIS — I63239 Cerebral infarction due to unspecified occlusion or stenosis of unspecified carotid arteries: Secondary | ICD-10-CM | POA: Diagnosis not present

## 2019-06-09 DIAGNOSIS — E1159 Type 2 diabetes mellitus with other circulatory complications: Secondary | ICD-10-CM

## 2019-06-09 LAB — COMPREHENSIVE METABOLIC PANEL
ALT: 18 U/L (ref 0–44)
AST: 24 U/L (ref 15–41)
Albumin: 3.8 g/dL (ref 3.5–5.0)
Alkaline Phosphatase: 89 U/L (ref 38–126)
Anion gap: 13 (ref 5–15)
BUN: 19 mg/dL (ref 8–23)
CO2: 22 mmol/L (ref 22–32)
Calcium: 9.4 mg/dL (ref 8.9–10.3)
Chloride: 103 mmol/L (ref 98–111)
Creatinine, Ser: 0.99 mg/dL (ref 0.44–1.00)
GFR calc Af Amer: >60 mL/min (ref >60)
GFR calc non Af Amer: 59 mL/min — ABNORMAL LOW (ref >60)
Glucose, Bld: 155 mg/dL — ABNORMAL HIGH (ref 70–99)
Potassium: 3 mmol/L — ABNORMAL LOW (ref 3.5–5.1)
Sodium: 138 mmol/L (ref 135–145)
Total Bilirubin: 0.8 mg/dL (ref 0.3–1.2)
Total Protein: 6.9 g/dL (ref 6.5–8.1)

## 2019-06-09 LAB — CBC WITH DIFFERENTIAL/PLATELET
Abs Immature Granulocytes: 0.02 10*3/uL (ref 0.00–0.07)
Basophils Absolute: 0 10*3/uL (ref 0.0–0.1)
Basophils Relative: 1 %
Eosinophils Absolute: 0.1 10*3/uL (ref 0.0–0.5)
Eosinophils Relative: 1 %
HCT: 43.4 % (ref 36.0–46.0)
Hemoglobin: 15 g/dL (ref 12.0–15.0)
Immature Granulocytes: 0 %
Lymphocytes Relative: 36 %
Lymphs Abs: 3.2 10*3/uL (ref 0.7–4.0)
MCH: 31.4 pg (ref 26.0–34.0)
MCHC: 34.6 g/dL (ref 30.0–36.0)
MCV: 91 fL (ref 80.0–100.0)
Monocytes Absolute: 0.9 10*3/uL (ref 0.1–1.0)
Monocytes Relative: 11 %
Neutro Abs: 4.6 10*3/uL (ref 1.7–7.7)
Neutrophils Relative %: 51 %
Platelets: 168 10*3/uL (ref 150–400)
RBC: 4.77 MIL/uL (ref 3.87–5.11)
RDW: 13 % (ref 11.5–15.5)
WBC: 8.8 10*3/uL (ref 4.0–10.5)
nRBC: 0 % (ref 0.0–0.2)

## 2019-06-09 LAB — GLUCOSE, CAPILLARY
Glucose-Capillary: 218 mg/dL — ABNORMAL HIGH (ref 70–99)
Glucose-Capillary: 157 mg/dL — ABNORMAL HIGH (ref 70–99)
Glucose-Capillary: 208 mg/dL — ABNORMAL HIGH (ref 70–99)
Glucose-Capillary: 192 mg/dL — ABNORMAL HIGH (ref 70–99)

## 2019-06-09 MED ORDER — WHITE PETROLATUM EX OINT
TOPICAL_OINTMENT | CUTANEOUS | Status: AC
  Administered 2019-06-09: 15:00:00
  Filled 2019-06-09: qty 28.35

## 2019-06-09 MED ORDER — POLYETHYLENE GLYCOL 3350 17 G PO PACK
17.0000 g | PACK | Freq: Every day | ORAL | Status: DC | PRN
  Administered 2019-06-18: 17 g via ORAL
  Filled 2019-06-09: qty 1

## 2019-06-09 MED ORDER — LISINOPRIL 10 MG PO TABS
10.0000 mg | ORAL_TABLET | Freq: Every day | ORAL | Status: DC
  Administered 2019-06-10 – 2019-06-16 (×7): 10 mg via ORAL
  Filled 2019-06-09 (×7): qty 1

## 2019-06-09 MED ORDER — BISACODYL 10 MG RE SUPP
10.0000 mg | Freq: Every day | RECTAL | Status: DC | PRN
  Administered 2019-06-19: 10 mg via RECTAL
  Filled 2019-06-09: qty 1

## 2019-06-09 MED ORDER — LISINOPRIL 10 MG PO TABS: 10.0000 mg | ORAL_TABLET | Freq: Every day | ORAL | Status: DC

## 2019-06-09 MED ORDER — PROCHLORPERAZINE 25 MG RE SUPP
12.5000 mg | Freq: Four times a day (QID) | RECTAL | Status: DC | PRN
  Filled 2019-06-09: qty 1

## 2019-06-09 MED ORDER — ALUM & MAG HYDROXIDE-SIMETH 200-200-20 MG/5ML PO SUSP: 30.0000 mL | ORAL | Status: DC | PRN

## 2019-06-09 MED ORDER — PROCHLORPERAZINE MALEATE 5 MG PO TABS: 5.0000 mg | ORAL_TABLET | Freq: Four times a day (QID) | ORAL | Status: DC | PRN

## 2019-06-09 MED ORDER — METFORMIN HCL 500 MG PO TABS
500.0000 mg | ORAL_TABLET | Freq: Two times a day (BID) | ORAL | Status: DC
  Administered 2019-06-10 – 2019-06-12 (×5): 500 mg via ORAL
  Filled 2019-06-09 (×5): qty 1

## 2019-06-09 MED ORDER — CLOPIDOGREL BISULFATE 75 MG PO TABS: 75.0000 mg | ORAL_TABLET | Freq: Every day | ORAL | Status: DC

## 2019-06-09 MED ORDER — PROCHLORPERAZINE EDISYLATE 10 MG/2ML IJ SOLN: 5.0000 mg | Freq: Four times a day (QID) | INTRAMUSCULAR | Status: DC | PRN

## 2019-06-09 MED ORDER — LEVETIRACETAM 500 MG PO TABS
1000.0000 mg | ORAL_TABLET | Freq: Two times a day (BID) | ORAL | Status: DC
  Administered 2019-06-09 – 2019-07-01 (×44): 1000 mg via ORAL
  Filled 2019-06-09 (×44): qty 2

## 2019-06-09 MED ORDER — INSULIN GLARGINE 100 UNIT/ML ~~LOC~~ SOLN: 8.0000 [IU] | Freq: Every day | SUBCUTANEOUS | 11 refills | Status: DC

## 2019-06-09 MED ORDER — INSULIN ASPART 100 UNIT/ML ~~LOC~~ SOLN
3.0000 [IU] | Freq: Three times a day (TID) | SUBCUTANEOUS | Status: DC
  Administered 2019-06-09: 3 [IU] via SUBCUTANEOUS
  Filled 2019-06-09: qty 1

## 2019-06-09 MED ORDER — ASPIRIN 81 MG PO TBEC: 81.0000 mg | DELAYED_RELEASE_TABLET | Freq: Every day | ORAL | Status: DC

## 2019-06-09 MED ORDER — FLEET ENEMA 7-19 GM/118ML RE ENEM: 1.0000 | ENEMA | Freq: Once | RECTAL | Status: DC | PRN

## 2019-06-09 MED ORDER — INSULIN ASPART 100 UNIT/ML ~~LOC~~ SOLN
0.0000 [IU] | Freq: Three times a day (TID) | SUBCUTANEOUS | Status: DC
  Administered 2019-06-10: 2 [IU] via SUBCUTANEOUS

## 2019-06-09 MED ORDER — INSULIN GLARGINE 100 UNIT/ML ~~LOC~~ SOLN
5.0000 [IU] | Freq: Every day | SUBCUTANEOUS | Status: AC
  Administered 2019-06-10 – 2019-06-11 (×2): 5 [IU] via SUBCUTANEOUS
  Filled 2019-06-09 (×2): qty 0.05

## 2019-06-09 MED ORDER — ATORVASTATIN CALCIUM 40 MG PO TABS
40.0000 mg | ORAL_TABLET | Freq: Every day | ORAL | Status: DC
  Administered 2019-06-09 – 2019-06-30 (×22): 40 mg via ORAL
  Filled 2019-06-09 (×23): qty 1

## 2019-06-09 MED ORDER — ACETAMINOPHEN 325 MG PO TABS: 325.0000 mg | ORAL_TABLET | ORAL | Status: DC | PRN

## 2019-06-09 MED ORDER — CLOPIDOGREL BISULFATE 75 MG PO TABS
75.0000 mg | ORAL_TABLET | Freq: Every day | ORAL | Status: DC
  Administered 2019-06-10 – 2019-07-01 (×22): 75 mg via ORAL
  Filled 2019-06-09 (×23): qty 1

## 2019-06-09 MED ORDER — ATORVASTATIN CALCIUM 40 MG PO TABS: 40.0000 mg | ORAL_TABLET | Freq: Every day | ORAL | Status: DC

## 2019-06-09 MED ORDER — INSULIN ASPART 100 UNIT/ML ~~LOC~~ SOLN: 0.0000 [IU] | Freq: Every day | SUBCUTANEOUS | Status: DC

## 2019-06-09 MED ORDER — ENOXAPARIN SODIUM 40 MG/0.4ML ~~LOC~~ SOLN
40.0000 mg | SUBCUTANEOUS | Status: DC
  Administered 2019-06-10 – 2019-07-01 (×22): 40 mg via SUBCUTANEOUS
  Filled 2019-06-09 (×22): qty 0.4

## 2019-06-09 MED ORDER — LEVETIRACETAM 1000 MG PO TABS: 1000.0000 mg | ORAL_TABLET | Freq: Two times a day (BID) | ORAL | Status: DC

## 2019-06-09 MED ORDER — GUAIFENESIN-DM 100-10 MG/5ML PO SYRP: 5.0000 mL | ORAL_SOLUTION | Freq: Four times a day (QID) | ORAL | Status: DC | PRN

## 2019-06-09 MED ORDER — DIPHENHYDRAMINE HCL 12.5 MG/5ML PO ELIX: 12.5000 mg | ORAL_SOLUTION | Freq: Four times a day (QID) | ORAL | Status: DC | PRN

## 2019-06-09 MED ORDER — TRAZODONE HCL 50 MG PO TABS
25.0000 mg | ORAL_TABLET | Freq: Every evening | ORAL | Status: DC | PRN
  Administered 2019-06-12 – 2019-06-26 (×5): 50 mg via ORAL
  Filled 2019-06-09 (×7): qty 1

## 2019-06-09 MED ORDER — ASPIRIN EC 81 MG PO TBEC
81.0000 mg | DELAYED_RELEASE_TABLET | Freq: Every day | ORAL | Status: DC
  Administered 2019-06-10 – 2019-07-01 (×22): 81 mg via ORAL
  Filled 2019-06-09 (×20): qty 1

## 2019-06-09 NOTE — Progress Notes (Signed)
Inpatient Rehabilitation Admissions Coordinator  I have insurance approval to admit pt to inpt rehab at [facility] today. I have contacted patient and her sister, Helene Kelp by phone. They are in agreement to admit. I will arrange transport with [facility] to [facility] for 1130. She will be admitted to [phone] by Dr. Leroy Sea. I have contacted Mel Almond, [place] of plans. I will make th arrangements to admit today. [facility] RN can call rehab unit at [phone] to provide report.   Danne Baxter, RN, MSN Rehab Admissions Coordinator [phone] 06/09/2019 8:41 AM

## 2019-06-09 NOTE — Progress Notes (Signed)
Husband called and made aware of pts discharge via EMS to [facility]

## 2019-06-09 NOTE — Progress Notes (Signed)
Physical Therapy Treatment Patient Details Name: Tammy Nunez MRN: [id number] DOB: June 27, 1951 Today's Date: 06/09/2019    History of Present Illness pt. is a 68 y.o. female who presented to the ED on 7/13 with L sided numbness and weakness. MRI positive for acute embolic infarctions in the R MCA territory and with minimal petechial blood products in the R posterior temporal occipital parietal junction. CT angio reveals severe carotid stenosis estimated at 80% or worse on the right and 70% or worse on the left. Per vascular consult, in setting of acute CVA, recommend waiting at least 2-3 weeks before open endarterectomy vs stenting.    PT Comments    Pt in bed, ready for session.  Supine exercises with focus on LLE for SLR, heel slides, ab/add and bridging x 10 AAROM.  Light assist needed for exercises.  Full clearance of hips during bridges.  Session focused on bed mobility to left and right 2 times on each side with min assist and verbal/tactile cues for sequencing.  While sitting on each side activities included sitting balance in static and dynamic reaching left/right outside of BOS and recovery strategies, seated LE AROM for LAQ and marches bilaterally and standing at bedside with min a for 3 trials holding onto bed rail for support.  Standing is improved and she is able to stand for 30 seconds each attempt before fatigue.  No buckling noted.  Pt positioned for breakfast in bed and tech notified that she has started her meal.  Pt progressing well with less "pushing L" in session noted while sitting and standing.  She remains an excellent CIR candidate.  Was able to work for full session with little fatigue or rest breaks.  Anticipate transfer today per chart review.    Follow Up Recommendations  CIR     Equipment Recommendations  Other (comment)    Recommendations for Other Services       Precautions / Restrictions Precautions Precautions: Fall Restrictions Weight Bearing  Restrictions: No    Mobility  Bed Mobility Overal bed mobility: Needs Assistance Bed Mobility: Supine to Sit;Rolling Rolling: Min assist   Supine to sit: Min assist Sit to supine: Min assist   General bed mobility comments: supine to sit on Right and Left side of bed x 2 with min assist and verbal cues.  some increased assist when fatigued but overall continues to do well.  Transfers Overall transfer level: Needs assistance Equipment used: None Transfers: Sit to/from Stand          Lateral/Scoot Transfers: Min assist General transfer comment: Min a x 1 to stand x 3 at edge of bed holding handrail for support.  Stood 30 seconds x 3 before fatigue  Ambulation/Gait             General Gait Details: deferred- unable to coordinate +2 assist.   Stairs             Wheelchair Mobility    Modified Rankin (Stroke Patients Only)       Balance Overall balance assessment: Needs assistance Sitting-balance support: Single extremity supported;Feet supported Sitting balance-Leahy Scale: Fair Sitting balance - Comments: able to hold midline for static tasks, min assist for dynamic tasks reaching outside of BOS   Standing balance support: Single extremity supported Standing balance-Leahy Scale: Poor                   Standardized Balance Assessment Standardized Balance Assessment : Berg Balance Test Berg Balance Test Sit to Stand: Needs  minimal aid to stand or to stabilize Standing Unsupported: Unable to stand 30 seconds unassisted Sitting with Back Unsupported but Feet Supported on Floor or Stool: Able to sit 2 minutes under supervision Stand to Sit: Controls descent by using hands Transfers: Needs two people to assist of supervise to be safe Standing Unsupported with Eyes Closed: Needs help to keep from falling Standing Ubsupported with Feet Together: Needs help to attain position and unable to hold for 15 seconds From Standing, Reach Forward with  Outstretched Arm: Loses balance while trying/requires external support From Standing Position, Pick up Object from Floor: Unable to try/needs assist to keep balance From Standing Position, Turn to Look Behind Over each Shoulder: Needs assist to keep from losing balance and falling Turn 360 Degrees: Needs assistance while turning Standing Unsupported, Alternately Place Feet on Step/Stool: Needs assistance to keep from falling or unable to try Standing Unsupported, One Foot in Front: Loses balance while stepping or standing Standing on One Leg: Unable to try or needs assist to prevent fall Total Score: 7        Cognition Arousal/Alertness: Awake/alert Behavior During Therapy: WFL for tasks assessed/performed Overall Cognitive Status: Within Functional Limits for tasks assessed                                        Exercises Other Exercises Other Exercises: bed mobility, sit to stand and exercises - see body of note for details.    General Comments        Pertinent Vitals/Pain Pain Assessment: No/denies pain    Home Living                      Prior Function            PT Goals (current goals can now be found in the care plan section) Progress towards PT goals: Progressing toward goals    Frequency           PT Plan Current plan remains appropriate    Co-evaluation              AM-PAC PT "6 Clicks" Mobility   Outcome Measure  Help needed turning from your back to your side while in a flat bed without using bedrails?: A Little Help needed moving from lying on your back to sitting on the side of a flat bed without using bedrails?: A Little Help needed moving to and from a bed to a chair (including a wheelchair)?: A Lot Help needed standing up from a chair using your arms (e.g., wheelchair or bedside chair)?: A Lot Help needed to walk in hospital room?: Total Help needed climbing 3-5 steps with a railing? : Total 6 Click Score:  12    End of Session Equipment Utilized During Treatment: Gait belt Activity Tolerance: Patient tolerated treatment well Patient left: in bed;with call bell/phone within reach;with bed alarm set Nurse Communication: Other (comment) Hemiplegia - Right/Left: Left Hemiplegia - dominant/non-dominant: Non-dominant Hemiplegia - caused by: Cerebral infarction     Time: 0932-6712 PT Time Calculation (min) (ACUTE ONLY): 34 min  Charges:  $Therapeutic Exercise: 8-22 mins $Therapeutic Activity: 8-22 mins                     Chesley Noon, PTA 06/09/19, 9:38 AM

## 2019-06-09 NOTE — PMR Pre-admission (Signed)
PMR Admission Coordinator Pre-Admission Assessment  Patient: Tammy Nunez is an 68 y.o., female MRN: [id number] DOB: 09/13/1951 Height: 5\' 2"  (157.5 cm) Weight: 77.1 kg  Insurance Information HMO:     PPO: yes     PCP:      IPA:      80/20:      OTHER: Medicare advantage PRIMARY: [employer]      Policy#: [id number]      Subscriber: pt CM Name: Jari Pigg      Phone#: [phone]     Fax#: Epic access Pre-Cert#: [id number]  F/u in 7 days. HTA has Epic access for updates    Employer: employed at a plant Benefits:  Phone #: [phone] and portal     Name: 7/15 Eff. Date: 11/24/2018     Deduct: none      Out of Pocket Max: $3400      Life Max: none CIR: $295 co pay per day days 1 until 6      SNF: $20 co pay per day days 1 until 20; $160 co pay per day days 21 until 100 Outpatient: $15 co pay per visit     Co-Pay: visits per medical neccesity Home Health: 100%      Co-Pay: visits per medical neccesity DME: 80%     Co-Pay: 20% Providers: in network  SECONDARY: none        Medicaid Application Date:       Case Manager:  Disability Application Date:       Case Worker:   The "Data Collection Information Summary" for patients in Inpatient Rehabilitation Facilities with attached "Privacy Act [place] Records" was provided and verbally reviewed with: Patient and Family  Emergency Contact Information Contact Information    Name Relation Home Work Mobile   [place] L Spouse [phone]     Day, Ermalinda Memos [phone]        Current Medical History  Patient Admitting Diagnosis: CVA  History of Present Illness: 68 year old female presented to [facility] on 06/06/2019 with no known medical history. Complaints of acute onset of left sided numbness and weakness upon awakening at 0530. 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 Blood glucose 285 on admit.Has not seen MD in several years and with no diagnosis of DM or HTN.  Neurology consulted. Patient placed on Asa,  Plavix and a statin. Echo with EF > 65%. CTA showed bilateral carotid stenosis. Patient noted twitching of left upper extremity and left eye felt to be partial seizure secondary to CVA. Started on Keppra. Will need f/u with Neurology.  Bilateral carotid stenosis and seen by Vascular surgeon. To follow up as an outpatient for intervention initially on the right then eventually on the left. Plan right carotid stenting in 2 weeks followed by left carotid stenting 6 weeks after. OR scheduler to reach out to patient to schedule per Dr. Delana Meyer.  DM. Hgb A1c 9.3. New diagnosis. Started on Lantus and sliding scale. Seen by Diabetes coordinator. Will need extensive teaching prior to d/c home form rehab.  DVT prophylaxis with lovenox.Dysphagia 3 diet with thin liquids. Whole meds with puree.   Complete NIHSS TOTAL: 9  Patient's medical record from [facility] has been reviewed by the rehabilitation admission coordinator and physician.  Past Medical History  History reviewed. No pertinent past medical history.  Family History   family history is not on file.  Prior Rehab/Hospitalizations Has the patient had prior rehab or hospitalizations prior to admission? Yes  Has the  patient had major surgery during 100 days prior to admission? No   Current Medications  Current Facility-Administered Medications:  .  acetaminophen (TYLENOL) tablet 650 mg, 650 mg, Oral, Q4H PRN, 650 mg at 06/09/19 0051 **OR** acetaminophen (TYLENOL) solution 650 mg, 650 mg, Per Tube, Q4H PRN **OR** acetaminophen (TYLENOL) suppository 650 mg, 650 mg, Rectal, Q4H PRN, Sudini, Srikar, MD .  aspirin EC tablet 81 mg, 81 mg, Oral, Daily, Sudini, Srikar, MD, 81 mg at 06/09/19 0756 .  atorvastatin (LIPITOR) tablet 40 mg, 40 mg, Oral, q1800, Sudini, Srikar, MD, 40 mg at 06/08/19 1726 .  clopidogrel (PLAVIX) tablet 75 mg, 75 mg, Oral, Daily, Sudini, Srikar, MD, 75 mg at 06/09/19 0756 .  enoxaparin (LOVENOX) injection 40 mg, 40 mg,  Subcutaneous, Daily, Sudini, Srikar, MD, 40 mg at 06/09/19 0755 .  hydrALAZINE (APRESOLINE) injection 10 mg, 10 mg, Intravenous, Q6H PRN, Hillary Bow, MD, 10 mg at 06/06/19 1819 .  insulin aspart (novoLOG) injection 0-9 Units, 0-9 Units, Subcutaneous, TID WC, Hillary Bow, MD, 3 Units at 06/09/19 0755 .  insulin aspart (novoLOG) injection 3 Units, 3 Units, Subcutaneous, TID WC, Hillary Bow, MD, 3 Units at 06/09/19 0815 .  insulin glargine (LANTUS) injection 8 Units, 8 Units, Subcutaneous, Daily, Hillary Bow, MD, 8 Units at 06/09/19 0756 .  levETIRAcetam (KEPPRA) tablet 1,000 mg, 1,000 mg, Oral, BID, Alexis Goodell, MD, 1,000 mg at 06/09/19 0756 .  lisinopril (ZESTRIL) tablet 10 mg, 10 mg, Oral, Daily, Sudini, Srikar, MD, 10 mg at 06/09/19 0756 .  ondansetron (ZOFRAN) injection 4 mg, 4 mg, Intravenous, Q6H PRN, Sudini, Srikar, MD .  senna-docusate (Senokot-S) tablet 1 tablet, 1 tablet, Oral, QHS PRN, Hillary Bow, MD, 1 tablet at 06/08/19 0800  Patients Current Diet:  Diet Order            DIET DYS 3 Room service appropriate? Yes with Assist; Fluid consistency: Thin  Diet effective now              Precautions / Restrictions Precautions Precautions: Fall Restrictions Weight Bearing Restrictions: No   Has the patient had 2 or more falls or a fall with injury in the past year? No  Prior Activity Level Community (5-7x/wk): independent; driving; working fulltime at a plant for 20 years  Prior Functional Level Self Care: Did the patient need help bathing, dressing, using the toilet or eating? Independent  Indoor Mobility: Did the patient need assistance with walking from room to room (with or without device)? Independent  Stairs: Did the patient need assistance with internal or external stairs (with or without device)? Independent  Functional Cognition: Did the patient need help planning regular tasks such as shopping or remembering to take medications?  Independent  Home Assistive Devices / Equipment Home Assistive Devices/Equipment: None Home Equipment: None  Prior Device Use: Indicate devices/aids used by the patient prior to current illness, exacerbation or injury? None of the above  Current Functional Level Cognition  Arousal/Alertness: Awake/alert Overall Cognitive Status: Within Functional Limits for tasks assessed Orientation Level: Oriented X4 Following Commands: Follows multi-step commands with increased time Safety/Judgement: Decreased awareness of deficits, Decreased awareness of safety General Comments: oriented to self, situation, location; follows commands and verbalizes awareness of deficits, limitations and safety needs.  Limited functional insight into deficits, limited self-initiation of compensatory or recovery strategies Attention: Focused, Sustained Focused Attention: Appears intact Sustained Attention: Appears intact Memory: Appears intact Awareness: Appears intact Problem Solving: Appears intact(grossly for verbal tasks) Executive Function: Reasoning, Self Monitoring, Self Correcting Reasoning:  Appears intact Self Monitoring: Appears intact Self Correcting: Appears intact Behaviors: (Left neglect, decreased awareness) Safety/Judgment: Appears intact(in discussion)    Extremity Assessment (includes Sensation/Coordination)  Upper Extremity Assessment: Defer to OT evaluation LUE Deficits / Details: grossly 2 to 2+/5, increased flexion through LUE noted when pt in standing, decreased attention to L side, does not protect LUE requiring verbal, visual, and sometimes tactile cues to increase attention to L side LUE Sensation: decreased proprioception, decreased light touch LUE Coordination: decreased fine motor, decreased gross motor  Lower Extremity Assessment: RLE deficits/detail, LLE deficits/detail RLE Deficits / Details: grossly 4+/5 (assessed laying in bed) RLE Sensation: WNL RLE Coordination: WNL LLE  Deficits / Details: pt able to perform SLR on LLE, knee flexion with tactile and extensive verbal cues, able to wiggle toes and minimally DF/PF. Pt with difficulty attending to L side, limited due to lethargy suspecting medications LLE Sensation: decreased light touch, decreased proprioception LLE Coordination: decreased fine motor, decreased gross motor    ADLs  Overall ADL's : Needs assistance/impaired Eating/Feeding: Set up Eating/Feeding Details (indicate cue type and reason): long sitting in bed with pillow under LUE for optimal positioning/safety, pt required assist for set up of coffee (does not attempt to incorporate LUE into task of opening creamer container or sugar packets) and other tray items, initiall attending only to far R side of tray, with max verbal/visual cues pt improves attention to midline of tray Grooming: Sitting, Set up, Min guard Grooming Details (indicate cue type and reason): EOB with CGA and occasional cues for upright sitting balance (leans/pushes to L occasionally) and using R dom UE, pt able to perform but does not incorporate LUE into task - would require Max A for assist to incorporate LUE into task Upper Body Bathing: Moderate assistance Lower Body Bathing: Moderate assistance, Sitting/lateral leans Upper Body Dressing : Sitting, Moderate assistance Lower Body Dressing: Sitting/lateral leans, Moderate assistance Toilet Transfer: BSC, Stand-pivot, Moderate assistance    Mobility  Overal bed mobility: Needs Assistance Bed Mobility: Supine to Sit, Rolling Rolling: Min assist Supine to sit: Min assist Sit to supine: Min assist General bed mobility comments: supine to sit on Right and Left side of bed x 2 with min assist and verbal cues.  some increased assist when fatigued but overall continues to do well.    Transfers  Overall transfer level: Needs assistance Equipment used: None Transfers: Sit to/from Stand Sit to Stand: Mod assist, +2 safety/equipment   Lateral/Scoot Transfers: Min assist General transfer comment: Min a x 1 to stand x 3 at edge of bed holding handrail for support.  Stood 30 seconds x 3 before fatigue    Ambulation / Gait / Stairs / Wheelchair Mobility  Ambulation/Gait Ambulation/Gait assistance: Mod assist, +2 physical assistance Gait Distance (Feet): 5 Feet Assistive device: 2 person hand held assist General Gait Details: deferred- unable to coordinate +2 assist.    Posture / Balance Dynamic Sitting Balance Sitting balance - Comments: able to hold midline for static tasks, min assist for dynamic tasks reaching outside of BOS Balance Overall balance assessment: Needs assistance Sitting-balance support: Single extremity supported, Feet supported Sitting balance-Leahy Scale: Fair Sitting balance - Comments: able to hold midline for static tasks, min assist for dynamic tasks reaching outside of BOS Postural control: Posterior lean, Left lateral lean Standing balance support: Single extremity supported Standing balance-Leahy Scale: Poor Standing balance comment: constant manual facilitation at L pelvis, hip and knee for extension, neutral position in closed-chain Standardized Balance Assessment Standardized Balance Assessment :  Berg Balance Test Berg Balance Test Sit to Stand: Needs minimal aid to stand or to stabilize Standing Unsupported: Unable to stand 30 seconds unassisted Sitting with Back Unsupported but Feet Supported on Floor or Stool: Able to sit 2 minutes under supervision Stand to Sit: Controls descent by using hands Transfers: Needs two people to assist of supervise to be safe Standing Unsupported with Eyes Closed: Needs help to keep from falling Standing Ubsupported with Feet Together: Needs help to attain position and unable to hold for 15 seconds From Standing, Reach Forward with Outstretched Arm: Loses balance while trying/requires external support From Standing Position, Pick up Object from Floor:  Unable to try/needs assist to keep balance From Standing Position, Turn to Look Behind Over each Shoulder: Needs assist to keep from losing balance and falling Turn 360 Degrees: Needs assistance while turning Standing Unsupported, Alternately Place Feet on Step/Stool: Needs assistance to keep from falling or unable to try Standing Unsupported, One Foot in Front: Loses balance while stepping or standing Standing on One Leg: Unable to try or needs assist to prevent fall Total Score: 7    Special needs/care consideration BiPAP/CPAP  N/a CPM  N/a Continuous Drip IV  N/a Dialysis n/a Life Vest  N/a Oxygen  N/a Special Bed  Seizure precautions Trach Size  N/a Wound Vac n/a Skin lip laceration upper lip; skin dry and flaky, ecchymosis BUE and breast; MASD to breast Bowel mgmt:  Incontinent LBM 7/16 Bladder mgmt:  External catheter Diabetic mgmt:  New DM diagnosis this admit. Hgb A1c 9.2, Seen by Diabetes coordinator at [facility]; needs extensive DM teaching prior to d/c home form CIR Behavioral consideration  N/a Chemo/radiation  N/a Has not seen MD in years; needs PCP   Previous Home Environment  Living Arrangements: Spouse/significant other  Lives With: Spouse Available Help at Discharge: Family, Available 24 hours/day Type of Home: House Home Layout: One level Home Access: Stairs to enter, Ramped entrance Entrance Stairs-Rails: None Entrance Stairs-Number of Steps: 4-5 steps to front door; ramped entrance to [employer] Shower/Tub: Tub/shower unit, Architectural technologist: Standard Bathroom Accessibility: Yes How Accessible: Accessible via walker [place]: No  Discharge Living Setting Plans for Discharge Living Setting: Patient's home, Lives with (comment)(spouse, Willie) Type of Home at Discharge: House Discharge Home Layout: One level Discharge Home Access: Stairs to enter Entrance Stairs-Number of Steps: 4-5 steps as well as a ramp Discharge Bathroom Shower/Tub:  Tub/shower unit, Curtain Discharge Bathroom Toilet: Standard Discharge Bathroom Accessibility: Yes How Accessible: Accessible via walker Does the patient have any problems obtaining your medications?: No  Social/Family/Support Systems Patient Roles: Spouse(fulltime employee; no children; very close knit family with ) Contact Information: sister, Helene Kelp Day, main contact and then spouse, Izell [place] Anticipated Caregiver: spouse and local sisters Anticipated Caregiver's Contact Information: Helene Kelp [phone] lives in [place]; [place] [phone] Ability/Limitations of Caregiver: spouse retired, has numerous sisters local Caregiver Availability: 24/7 Discharge Plan Discussed with Primary Caregiver: Yes Is Caregiver In Agreement with Plan?: Yes Does Caregiver/Family have Issues with Lodging/Transportation while Pt is in Rehab?: No  Goals/Additional Needs Patient/Family Goal for Rehab: Mod I to supervision PT and OT, supervision SLP Expected length of stay: ELOS 10 to 12 days Special Service Needs: [place] and family want to go home as soon as realistic due to [place] concerns Pt/Family Agrees to Admission and willing to participate: Yes Program Orientation Provided & Reviewed with Pt/Caregiver Including Roles  & Responsibilities: Yes  Decrease burden of Care through IP rehab admission: n/a  Possible need for SNF placement upon discharge:  Not anticipated  Patient Condition: I have reviewed medical records from [facility], spoken with CSW, and patient and family member, sister, Helene Kelp Day. I discussed via phone for inpatient rehabilitation assessment.  Patient will benefit from ongoing PT, OT and SLP, can actively participate in 3 hours of therapy a day 5 days of the week, and can make measurable gains during the admission.  Patient will also benefit from the coordinated team approach during an Inpatient Acute Rehabilitation admission.  The patient will receive intensive therapy as well as Rehabilitation  physician, nursing, social worker, and care management interventions.  Due to bladder management, bowel management, safety, skin/wound care, disease management, medication administration, pain management and patient education the patient requires 24 hour a day rehabilitation nursing.  The patient is currently mod assist with mobility and basic ADLs.  Discharge setting and therapy post discharge at home with home health is anticipated.  Patient has agreed to participate in the Acute Inpatient Rehabilitation Program and will admit today.  Preadmission Screen Completed By:  Cleatrice Burke RN MSN 06/09/2019 10:00 AM ______________________________________________________________________   Discussed status with Dr. Naaman Plummer on  06/09/2019 at  1000 and received approval for admission today.  Admission Coordinator:  Cleatrice Burke, RN, time  1000 Date  06/09/2019   Assessment/Plan: Diagnosis: right front-parietal infarct 1. Does the need for close, 24 hr/day Medical supervision in concert with the patient's rehab needs make it unreasonable for this patient to be served in a less intensive setting? Yes 2. Co-Morbidities requiring supervision/potential complications: seizure, post-infarct sequelae 3. Due to bladder management, bowel management, safety, skin/wound care, disease management, medication administration, pain management and patient education, does the patient require 24 hr/day rehab nursing? Yes 4. Does the patient require coordinated care of a physician, rehab nurse, PT (1-2 hrs/day, 5 days/week), OT (1-2 hrs/day, 5 days/week) and SLP (1-2 hrs/day, 5 days/week) to address physical and functional deficits in the context of the above medical diagnosis(es)? Yes Addressing deficits in the following areas: balance, endurance, locomotion, strength, transferring, bowel/bladder control, bathing, dressing, feeding, grooming, toileting, cognition, swallowing and psychosocial support 5. Can the  patient actively participate in an intensive therapy program of at least 3 hrs of therapy 5 days a week? Yes 6. The potential for patient to make measurable gains while on inpatient rehab is excellent 7. Anticipated functional outcomes upon discharge from inpatients are: modified independent and supervision PT, modified independent and supervision OT, supervision SLP 8. Estimated rehab length of stay to reach the above functional goals is: 10-12 days 9. Anticipated D/C setting: Home 10. Anticipated post D/C treatments: [place] therapy 11. Overall Rehab/Functional Prognosis: excellent  MD Signature: Meredith Staggers, MD, [facility] 06/09/2019

## 2019-06-09 NOTE — TOC Transition Note (Signed)
Transition of Care [facility]) - CM/SW Discharge Note   Patient Details  Name: Tammy Nunez MRN: [id number] Date of Birth: 09/06/1951  [facility] CM/SW Contact:  Jovaun Levene, Lenice Llamas Phone Number: [phone]  06/09/2019, 8:53 AM   Clinical Narrative: Per Pamala Hurry admissions coordinator at [facility]) Health Team has approved CIR and they can accept patient today. Per Pamala Hurry she will arrange transport via [place] at 11:30 am. Clinical Education officer, museum (CSW) completed medical necessity from and placed it on the chart. Pamala Hurry has notified patient and her sister Helene Kelp Day. Please reconsult if future social work needs arise. CSW signing off.     Final next level of care: IP Rehab Facility Barriers to Discharge: Barriers Resolved   Patient Goals and CMS Choice Patient states their goals for this hospitalization and ongoing recovery are:: To get back to baseline   Choice offered to / list presented to : Patient  Discharge Placement              Patient chooses bed at: ([place] Patient to be transferred to facility by: [place] Name of family member notified: Patient's sister Helene Kelp is aware of D/C today. Patient and family notified of of transfer: 06/09/19  Discharge Plan and Services In-house Referral: Clinical Social Work Discharge Planning Services: CM Consult Post Acute Care Choice: IP Rehab                               Social Determinants of Health (SDOH) Interventions     Readmission Risk Interventions No flowsheet data found.

## 2019-06-09 NOTE — Progress Notes (Signed)
Patient admitted to IP rehab at approximately 1:15pm via stretcher from [facility]. Patient denies pain or discomfort.  Stitches in place to right upper lip r/t fall in ED. Patient informed about safety plan.

## 2019-06-09 NOTE — H&P (Signed)
Physical Medicine and Rehabilitation Admission H&P        Chief Complaint  Patient presents with  . Functional deficits due to stroke      HPI: Tammy Nunez is a 68 year old female relatively good health (but no medical care for years) who was admitted to [facility] on 06/06/2019 evening with left-sided numbness and weakness that started early a.m. patient's blood pressure was significantly elevated at 214/110 and her blood glucose was elevated at 285.  CTA head neck showed severe atherosclerotic disease at both carotid bifurcation and ICA bulbs with > 80% stenosis on the right and > 70% stenosis on the left.  MRI brain showed embolic infarcts in right MCA territory affecting temporal, parietal occipital junction and right frontoparietal vertex with minimal petechial blood products in right posterior temporal parietal occipital  junction without frank hematoma.  2D echo done revealing EF greater than 65% with severely increased left ventricular wall thickness.  Dr. Doy Mince felt that stroke was due to small vessel disease and recommended DAPT and vascular surgery was consulted for input.     Vascular recommended waiting 2 to 3 weeks and follow-up with Dr. Payton Mccallum for right carotid artery stenting in 2 weeks followed by left carotid artery stenting 6 weeks thereafter.  At admission, she was noted to have left facial and LUE twitching felt to be due to simple partial motor seizures therefore loaded with Keppra and started on 500 mg twice daily.  She continued to have ongoing symptoms therefore Keppra increased to 1000 mg bid.  Downgraded to dysphagia 3 chopped meat due to dental issues. Patient with right gaze preference with left neglect, decreased awareness of deficits, high-level cognitive deficits left-sided weakness with pusher tendencies.  CIR recommended due to functional deficits     Review of Systems  Constitutional: Negative for fever.  HENT: Negative for hearing loss and tinnitus.    Eyes: Negative for blurred vision and double vision.  Respiratory: Negative for cough and shortness of breath.   Cardiovascular: Negative for chest pain and palpitations.  Gastrointestinal: Negative for constipation, heartburn and nausea.  Genitourinary: Negative for dysuria and urgency.  Musculoskeletal: Positive for joint pain (chronic left shoulder pain). Negative for back pain and myalgias.  Skin: Negative for itching and rash.  Neurological: Positive for sensory change and weakness. Negative for dizziness and headaches.  Psychiatric/Behavioral: Negative for memory loss. The patient does not have insomnia.           Past Medical History:  Diagnosis Date  . Chronic left shoulder pain             Past Surgical History:  Procedure Laterality Date  . ECTOPIC PREGNANCY SURGERY              Family History  Problem Relation Age of Onset  . Diabetes type II Mother    . Hypertension Father    . Diabetes type II Brother        Social History: married. Was working prior to admission. She reports that she has been smoking 1/2 PPD cigarettes PTA. She has never used smokeless tobacco. She reports that she does not drink alcohol or use drugs.      Allergies: No Known Allergies            Medications Prior to Admission  Medication Sig Dispense Refill  . [START ON 06/10/2019] aspirin EC 81 MG EC tablet Take 1 tablet (81 mg total) by mouth daily.      Marland Kitchen  atorvastatin (LIPITOR) 40 MG tablet Take 1 tablet (40 mg total) by mouth daily at 6 PM.      . [START ON 06/10/2019] clopidogrel (PLAVIX) 75 MG tablet Take 1 tablet (75 mg total) by mouth daily.      Marland Kitchen ibuprofen (ADVIL) 200 MG tablet Take 200-400 mg by mouth every 6 (six) hours as needed for fever or mild pain.      Derrill Memo ON 06/10/2019] insulin glargine (LANTUS) 100 UNIT/ML injection Inject 0.08 mLs (8 Units total) into the skin daily. 10 mL 11  . levETIRAcetam (KEPPRA) 1000 MG tablet Take 1 tablet (1,000 mg total) by mouth 2 (two)  times daily.      Derrill Memo ON 06/10/2019] lisinopril (ZESTRIL) 10 MG tablet Take 1 tablet (10 mg total) by mouth daily.      . vitamin B-12 (CYANOCOBALAMIN) 1000 MCG tablet Take 1,000 mcg by mouth daily.         Drug Regimen Review  Drug regimen was reviewed and remains appropriate with no significant issues identified   Home: Home Living Family/patient expects to be discharged to:: Private residence Living Arrangements: Spouse/significant other Available Help at Discharge: Family, Available 24 hours/day Type of Home: House Home Access: Stairs to enter, Ramped entrance [employer] of Steps: 4-5 steps to front door; ramped entrance to [employer] Stairs-Rails: None Home Layout: One level Bathroom Shower/Tub: Tub/shower unit, Architectural technologist: Programmer, systems: Yes Home Equipment: None  Lives With: Spouse   Functional History: Prior Function Level of Independence: Independent Comments: Indep, no AD, only fall was in the ED this admission   Functional Status:  Mobility: Bed Mobility Overal bed mobility: Needs Assistance Bed Mobility: Supine to Sit, Rolling Rolling: Min assist Supine to sit: Min assist Sit to supine: Min assist General bed mobility comments: supine to sit on Right and Left side of bed x 2 with min assist and verbal cues.  some increased assist when fatigued but overall continues to do well. Transfers Overall transfer level: Needs assistance Equipment used: None Transfers: Sit to/from Stand Sit to Stand: Mod assist, +2 safety/equipment  Lateral/Scoot Transfers: Min assist General transfer comment: Min a x 1 to stand x 3 at edge of bed holding handrail for support.  Stood 30 seconds x 3 before fatigue Ambulation/Gait Ambulation/Gait assistance: Mod assist, +2 physical assistance Gait Distance (Feet): 5 Feet Assistive device: 2 person hand held assist General Gait Details: deferred- unable to coordinate +2 assist.      ADL: ADL Overall ADL's : Needs assistance/impaired Eating/Feeding: Set up Eating/Feeding Details (indicate cue type and reason): long sitting in bed with pillow under LUE for optimal positioning/safety, pt required assist for set up of coffee (does not attempt to incorporate LUE into task of opening creamer container or sugar packets) and other tray items, initiall attending only to far R side of tray, with max verbal/visual cues pt improves attention to midline of tray Grooming: Sitting, Set up, Min guard Grooming Details (indicate cue type and reason): EOB with CGA and occasional cues for upright sitting balance (leans/pushes to L occasionally) and using R dom UE, pt able to perform but does not incorporate LUE into task - would require Max A for assist to incorporate LUE into task Upper Body Bathing: Moderate assistance Lower Body Bathing: Moderate assistance, Sitting/lateral leans Upper Body Dressing : Sitting, Moderate assistance Lower Body Dressing: Sitting/lateral leans, Moderate assistance Toilet Transfer: BSC, Stand-pivot, Moderate assistance   Cognition: Cognition Overall Cognitive Status: Within Functional Limits  for tasks assessed Arousal/Alertness: Awake/alert Orientation Level: Oriented X4 Attention: Focused, Sustained Focused Attention: Appears intact Sustained Attention: Appears intact Memory: Appears intact Awareness: Appears intact Problem Solving: Appears intact(grossly for verbal tasks) Executive Function: Reasoning, Self Monitoring, Self Correcting Reasoning: Appears intact Self Monitoring: Appears intact Self Correcting: Appears intact Behaviors: (Left neglect, decreased awareness) Safety/Judgment: Appears intact(in discussion) Cognition Arousal/Alertness: Awake/alert Behavior During Therapy: WFL for tasks assessed/performed Overall Cognitive Status: Within Functional Limits for tasks assessed Area of Impairment: Safety/judgement, Following commands, Problem  solving Following Commands: Follows multi-step commands with increased time Safety/Judgement: Decreased awareness of deficits, Decreased awareness of safety Problem Solving: Requires verbal cues, Requires tactile cues General Comments: oriented to self, situation, location; follows commands and verbalizes awareness of deficits, limitations and safety needs.  Limited functional insight into deficits, limited self-initiation of compensatory or recovery strategies     Blood pressure (!) 154/72, pulse 79, temperature 99.5 F (37.5 C), resp. rate 18, height 5\' 2"  (1.575 m), weight 77.1 kg, SpO2 95 %. Physical Exam  Nursing note and vitals reviewed. Constitutional: She is oriented to person, place, and time. She appears well-developed and well-nourished.  obese  HENT:  Head: Normocephalic and atraumatic.  Sutured laceration upper lip, well-approximated. Multiple teeth missing.   Eyes: Pupils are equal, round, and reactive to light. EOM are normal.  Neck: Normal range of motion. No tracheal deviation present. No thyromegaly present.  Cardiovascular: Normal rate and regular rhythm. Exam reveals no friction rub.  No murmur heard. Respiratory: Effort normal and breath sounds normal. No respiratory distress. She has no wheezes.  GI: Soft. She exhibits no distension. There is no abdominal tenderness.  Musculoskeletal:        General: No edema.  Neurological: She is alert and oriented to person, place, and time.  Mild dysarthria, left central 7. Cognitively demonstrates reasonable insight and awareness. Right gaze preference. LUE tr to 1/5 Left PEC, Bicep, 0/5 remaining LUE. LLE: 2+/5 HF, KE and 2 to 2+ ADF/PF. decreased sensation to LT LUE and LLE.      Skin: Skin is warm and dry.  Psychiatric: She has a normal mood and affect. Her behavior is normal.  Very pleasant      Lab Results Last 48 Hours       Results for orders placed or performed during the hospital encounter of 06/06/19 (from the  past 48 hour(s))  Glucose, capillary     Status: Abnormal    Collection Time: 06/07/19  4:44 PM  Result Value Ref Range    Glucose-Capillary 153 (H) 70 - 99 mg/dL  Glucose, capillary     Status: Abnormal    Collection Time: 06/07/19  9:24 PM  Result Value Ref Range    Glucose-Capillary 193 (H) 70 - 99 mg/dL  Glucose, capillary     Status: Abnormal    Collection Time: 06/08/19  7:44 AM  Result Value Ref Range    Glucose-Capillary 161 (H) 70 - 99 mg/dL  Glucose, capillary     Status: Abnormal    Collection Time: 06/08/19 11:57 AM  Result Value Ref Range    Glucose-Capillary 235 (H) 70 - 99 mg/dL  Glucose, capillary     Status: Abnormal    Collection Time: 06/08/19  4:57 PM  Result Value Ref Range    Glucose-Capillary 190 (H) 70 - 99 mg/dL  Glucose, capillary     Status: Abnormal    Collection Time: 06/09/19  7:22 AM  Result Value Ref Range    Glucose-Capillary 218 (  H) 70 - 99 mg/dL     Imaging Results (Last 48 hours)  No results found.           Medical Problem List and Plan: 1.  Functional deficits and left hemiparesis secondary to right MCA distribution embolic infarcts             -admit to inpatient rehab 2.  Antithrombotics: -DVT/anticoagulation:  Pharmaceutical: Lovenox             -antiplatelet therapy: ASA/Plavix 3. Pain Management: Tylenol prn 4. Mood: LCSW to follow for evaluation and support.              -antipsychotic agents: N/A 5. Neuropsych: This patient is capable of making decisions on her own behalf. 6. Skin/Wound Care: Routine pressure relief measures   7. Fluids/Electrolytes/Nutrition: Monitor I/O.              -I personally reviewed the patient's labs today.               -replace potassium (3.0) 8. T2DM: New diagnosis with Hgb A1c- 9.3. Educate on Carb modified diet. Change Lantus/novolog to low dose metformin titrate upwards as indicated.  Will continue to use sliding scale insulin for elevated blood sugars. 9. Bilateral carotid artery stenosis:  Follow-up with vascular surgery after discharge 10. New onset simple partial seizures: Continue Keppra 1000 mg bid.  11. Morbid obesity: BMI 31.0--educated patient on importance of weight loss to help with overall health and mobility. 12. HTN: Monitor BP tid--avoid hypotension.  13. Tobacco use: Continue to educate on importance of cessation.  14. Dyslipidemia: Continue statin        Post Admission Physician Evaluation: 1. Functional deficits secondary  to right CVA. 2. Patient is admitted to receive collaborative, interdisciplinary care between the physiatrist, rehab nursing staff, and therapy team. 3. Patient's level of medical complexity and substantial therapy needs in context of that medical necessity cannot be provided at a lesser intensity of care such as a SNF. 4. Patient has experienced substantial functional loss from his/her baseline which was documented above under the "Functional History" and "Functional Status" headings.  Judging by the patient's diagnosis, physical exam, and functional history, the patient has potential for functional progress which will result in measurable gains while on inpatient rehab.  These gains will be of substantial and practical use upon discharge  in facilitating mobility and self-care at the household level. 5. Physiatrist will provide 24 hour management of medical needs as well as oversight of the therapy plan/treatment and provide guidance as appropriate regarding the interaction of the two. 6. The Preadmission Screening has been reviewed and patient status is unchanged unless otherwise stated above. 7. 24 hour rehab nursing will assist with bladder management, bowel management, safety, skin/wound care, disease management, medication administration, pain management and patient education  and help integrate therapy concepts, techniques,education, etc. 8. PT will assess and treat for/with: Lower extremity strength, range of motion, stamina, balance,  functional mobility, safety, adaptive techniques and equipment, NMR.   Goals are: mod I to supervision. 9. OT will assess and treat for/with: ADL's, functional mobility, safety, upper extremity strength, adaptive techniques and equipment, NMR.   Goals are: mod I to supervision. Therapy may proceed with showering this patient. 10. SLP will assess and treat for/with: cognition, communication.  Goals are: mod I to supervision. 11. Case Management and Social Worker will assess and treat for psychological issues and discharge planning. 12. Team conference will be held weekly to assess progress  toward goals and to determine barriers to discharge. 13. Patient will receive at least 3 hours of therapy per day at least 5 days per week. 14. ELOS: 10-12 days       15. Prognosis:  excellent   I have personally performed a face to face diagnostic evaluation of this patient and formulated the key components of the plan.  Additionally, I have personally reviewed laboratory data, imaging studies, as well as relevant notes and concur with the physician assistant's documentation above.  Meredith Staggers, MD, [place], PA-C 06/09/2019

## 2019-06-09 NOTE — Progress Notes (Signed)
Meredith Staggers, MD  Physician  Physical Medicine and Rehabilitation  PMR Pre-admission  Signed  Date of Service:  06/09/2019 9:16 AM      Related encounter: ED to Hosp-Admission (Discharged) from 06/06/2019 in [place] (1C)      Signed         Show:Clear all [x] Manual[x] Template[] Copied  Added by: [x] Cristina Gong, RN[x] Meredith Staggers, MD  [] Hover for details PMR Admission Coordinator Pre-Admission Assessment  Patient: Tammy Nunez is an 68 y.o., female MRN: [id number] DOB: December 03, 1950 Height: 5\' 2"  (157.5 cm) Weight: 77.1 kg  Insurance Information HMO:     PPO: yes     PCP:      IPA:      80/20:      OTHER: Medicare advantage PRIMARY: [employer]      Policy#: [id number]      Subscriber: pt CM Name: Jari Pigg      Phone#: [phone]     Fax#: Epic access Pre-Cert#: [id number]  F/u in 7 days. HTA has Epic access for updates    Employer: employed at a plant Benefits:  Phone #: [phone] and portal     Name: 7/15 Eff. Date: 11/24/2018     Deduct: none      Out of Pocket Max: $3400      Life Max: none CIR: $295 co pay per day days 1 until 6      SNF: $20 co pay per day days 1 until 20; $160 co pay per day days 21 until 100 Outpatient: $15 co pay per visit     Co-Pay: visits per medical neccesity Home Health: 100%      Co-Pay: visits per medical neccesity DME: 80%     Co-Pay: 20% Providers: in network  SECONDARY: none        Medicaid Application Date:       Case Manager:  Disability Application Date:       Case Worker:   The "Data Collection Information Summary" for patients in Inpatient Rehabilitation Facilities with attached "Privacy Act [place] Records" was provided and verbally reviewed with: Patient and Family  Emergency Contact Information         Contact Information    Name Relation Home Work Mobile   Munford L Spouse [phone]     Day, Ermalinda Memos [phone]         Current Medical History  Patient Admitting Diagnosis: CVA  History of Present Illness: 68 year old female presented to [facility] on [facility]/06[facility]2019 with no known medical history. Complaints of acute onset of left sided numbness and weakness upon awakening at 0530. 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 Blood glucose 285 on admit.Has not seen MD in several years and with no diagnosis of DM or HTN.  Neurology consulted. Patient placed on Asa, Plavix and a statin. Echo with EF > 65%. CTA showed bilateral carotid stenosis. Patient noted twitching of left upper extremity and left eye felt to be partial seizure secondary to CVA. Started on Keppra. Will need f/u with Neurology.  Bilateral carotid stenosis and seen by Vascular surgeon. To follow up as an outpatient for intervention initially on the right then eventually on the left. Plan right carotid stenting in 2 weeks followed by left carotid stenting 6 weeks after. OR scheduler to reach out to patient to schedule per Dr. Delana Meyer.  DM. Hgb A1c 9.3. New diagnosis. Started on Lantus and sliding scale. Seen by Diabetes coordinator. Will need extensive teaching  prior to d/c home form rehab.  DVT prophylaxis with lovenox.Dysphagia 3 diet with thin liquids. Whole meds with puree.   Complete NIHSS TOTAL: 9  Patient's medical record from [facility] has been reviewed by the rehabilitation admission coordinator and physician.  Past Medical History  History reviewed. No pertinent past medical history.  Family History   family history is not on file.  Prior Rehab/Hospitalizations Has the patient had prior rehab or hospitalizations prior to admission? Yes  Has the patient had major surgery during 100 days prior to admission? No              Current Medications  Current Facility-Administered Medications:  .  acetaminophen (TYLENOL) tablet 650 mg, 650 mg, Oral, Q4H PRN, 650 mg at 06/09/19 0051 **OR** acetaminophen (TYLENOL) solution 650 mg, 650  mg, Per Tube, Q4H PRN **OR** acetaminophen (TYLENOL) suppository 650 mg, 650 mg, Rectal, Q4H PRN, Sudini, Srikar, MD .  aspirin EC tablet 81 mg, 81 mg, Oral, Daily, Sudini, Srikar, MD, 81 mg at 06/09/19 0756 .  atorvastatin (LIPITOR) tablet 40 mg, 40 mg, Oral, q1800, Sudini, Srikar, MD, 40 mg at 06/08/19 1726 .  clopidogrel (PLAVIX) tablet 75 mg, 75 mg, Oral, Daily, Sudini, Srikar, MD, 75 mg at 06/09/19 0756 .  enoxaparin (LOVENOX) injection 40 mg, 40 mg, Subcutaneous, Daily, Sudini, Srikar, MD, 40 mg at 06/09/19 0755 .  hydrALAZINE (APRESOLINE) injection 10 mg, 10 mg, Intravenous, Q6H PRN, Hillary Bow, MD, 10 mg at 06/06/19 1819 .  insulin aspart (novoLOG) injection 0-9 Units, 0-9 Units, Subcutaneous, TID WC, Hillary Bow, MD, 3 Units at 06/09/19 0755 .  insulin aspart (novoLOG) injection 3 Units, 3 Units, Subcutaneous, TID WC, Hillary Bow, MD, 3 Units at 06/09/19 0815 .  insulin glargine (LANTUS) injection 8 Units, 8 Units, Subcutaneous, Daily, Hillary Bow, MD, 8 Units at 06/09/19 0756 .  levETIRAcetam (KEPPRA) tablet 1,000 mg, 1,000 mg, Oral, BID, Alexis Goodell, MD, 1,000 mg at 06/09/19 0756 .  lisinopril (ZESTRIL) tablet 10 mg, 10 mg, Oral, Daily, Sudini, Srikar, MD, 10 mg at 06/09/19 0756 .  ondansetron (ZOFRAN) injection 4 mg, 4 mg, Intravenous, Q6H PRN, Sudini, Srikar, MD .  senna-docusate (Senokot-S) tablet 1 tablet, 1 tablet, Oral, QHS PRN, Hillary Bow, MD, 1 tablet at 06/08/19 0800  Patients Current Diet:     Diet Order                  DIET DYS 3 Room service appropriate? Yes with Assist; Fluid consistency: Thin  Diet effective now               Precautions / Restrictions Precautions Precautions: Fall Restrictions Weight Bearing Restrictions: No   Has the patient had 2 or more falls or a fall with injury in the past year? No  Prior Activity Level Community (5-7x/wk): independent; driving; working fulltime at a plant for 20 years  Prior  Functional Level Self Care: Did the patient need help bathing, dressing, using the toilet or eating? Independent  Indoor Mobility: Did the patient need assistance with walking from room to room (with or without device)? Independent  Stairs: Did the patient need assistance with internal or external stairs (with or without device)? Independent  Functional Cognition: Did the patient need help planning regular tasks such as shopping or remembering to take medications? Independent  Home Assistive Devices / Equipment Home Assistive Devices/Equipment: None Home Equipment: None  Prior Device Use: Indicate devices/aids used by the patient prior to current illness, exacerbation or injury? None  of the above  Current Functional Level Cognition  Arousal/Alertness: Awake/alert Overall Cognitive Status: Within Functional Limits for tasks assessed Orientation Level: Oriented X4 Following Commands: Follows multi-step commands with increased time Safety/Judgement: Decreased awareness of deficits, Decreased awareness of safety General Comments: oriented to self, situation, location; follows commands and verbalizes awareness of deficits, limitations and safety needs.  Limited functional insight into deficits, limited self-initiation of compensatory or recovery strategies Attention: Focused, Sustained Focused Attention: Appears intact Sustained Attention: Appears intact Memory: Appears intact Awareness: Appears intact Problem Solving: Appears intact(grossly for verbal tasks) Executive Function: Reasoning, Self Monitoring, Self Correcting Reasoning: Appears intact Self Monitoring: Appears intact Self Correcting: Appears intact Behaviors: (Left neglect, decreased awareness) Safety/Judgment: Appears intact(in discussion)    Extremity Assessment (includes Sensation/Coordination)  Upper Extremity Assessment: Defer to OT evaluation LUE Deficits / Details: grossly 2 to 2+/5, increased flexion  through LUE noted when pt in standing, decreased attention to L side, does not protect LUE requiring verbal, visual, and sometimes tactile cues to increase attention to L side LUE Sensation: decreased proprioception, decreased light touch LUE Coordination: decreased fine motor, decreased gross motor  Lower Extremity Assessment: RLE deficits/detail, LLE deficits/detail RLE Deficits / Details: grossly 4+/5 (assessed laying in bed) RLE Sensation: WNL RLE Coordination: WNL LLE Deficits / Details: pt able to perform SLR on LLE, knee flexion with tactile and extensive verbal cues, able to wiggle toes and minimally DF/PF. Pt with difficulty attending to L side, limited due to lethargy suspecting medications LLE Sensation: decreased light touch, decreased proprioception LLE Coordination: decreased fine motor, decreased gross motor    ADLs  Overall ADL's : Needs assistance/impaired Eating/Feeding: Set up Eating/Feeding Details (indicate cue type and reason): long sitting in bed with pillow under LUE for optimal positioning/safety, pt required assist for set up of coffee (does not attempt to incorporate LUE into task of opening creamer container or sugar packets) and other tray items, initiall attending only to far R side of tray, with max verbal/visual cues pt improves attention to midline of tray Grooming: Sitting, Set up, Min guard Grooming Details (indicate cue type and reason): EOB with CGA and occasional cues for upright sitting balance (leans/pushes to L occasionally) and using R dom UE, pt able to perform but does not incorporate LUE into task - would require Max A for assist to incorporate LUE into task Upper Body Bathing: Moderate assistance Lower Body Bathing: Moderate assistance, Sitting/lateral leans Upper Body Dressing : Sitting, Moderate assistance Lower Body Dressing: Sitting/lateral leans, Moderate assistance Toilet Transfer: BSC, Stand-pivot, Moderate assistance    Mobility   Overal bed mobility: Needs Assistance Bed Mobility: Supine to Sit, Rolling Rolling: Min assist Supine to sit: Min assist Sit to supine: Min assist General bed mobility comments: supine to sit on Right and Left side of bed x 2 with min assist and verbal cues.  some increased assist when fatigued but overall continues to do well.    Transfers  Overall transfer level: Needs assistance Equipment used: None Transfers: Sit to/from Stand Sit to Stand: Mod assist, +2 safety/equipment  Lateral/Scoot Transfers: Min assist General transfer comment: Min a x 1 to stand x 3 at edge of bed holding handrail for support.  Stood 30 seconds x 3 before fatigue    Ambulation / Gait / Stairs / Wheelchair Mobility  Ambulation/Gait Ambulation/Gait assistance: Mod assist, +2 physical assistance Gait Distance (Feet): 5 Feet Assistive device: 2 person hand held assist General Gait Details: deferred- unable to coordinate +2 assist.  Posture / Balance Dynamic Sitting Balance Sitting balance - Comments: able to hold midline for static tasks, min assist for dynamic tasks reaching outside of BOS Balance Overall balance assessment: Needs assistance Sitting-balance support: Single extremity supported, Feet supported Sitting balance-Leahy Scale: Fair Sitting balance - Comments: able to hold midline for static tasks, min assist for dynamic tasks reaching outside of BOS Postural control: Posterior lean, Left lateral lean Standing balance support: Single extremity supported Standing balance-Leahy Scale: Poor Standing balance comment: constant manual facilitation at L pelvis, hip and knee for extension, neutral position in closed-chain Standardized Balance Assessment Standardized Balance Assessment : Berg Balance Test Berg Balance Test Sit to Stand: Needs minimal aid to stand or to stabilize Standing Unsupported: Unable to stand 30 seconds unassisted Sitting with Back Unsupported but Feet Supported on Floor or  Stool: Able to sit 2 minutes under supervision Stand to Sit: Controls descent by using hands Transfers: Needs two people to assist of supervise to be safe Standing Unsupported with Eyes Closed: Needs help to keep from falling Standing Ubsupported with Feet Together: Needs help to attain position and unable to hold for 15 seconds From Standing, Reach Forward with Outstretched Arm: Loses balance while trying/requires external support From Standing Position, Pick up Object from Floor: Unable to try/needs assist to keep balance From Standing Position, Turn to Look Behind Over each Shoulder: Needs assist to keep from losing balance and falling Turn 360 Degrees: Needs assistance while turning Standing Unsupported, Alternately Place Feet on Step/Stool: Needs assistance to keep from falling or unable to try Standing Unsupported, One Foot in Front: Loses balance while stepping or standing Standing on One Leg: Unable to try or needs assist to prevent fall Total Score: 7    Special needs/care consideration BiPAP/CPAP  N/a CPM  N/a Continuous Drip IV  N/a Dialysis n/a Life Vest  N/a Oxygen  N/a Special Bed  Seizure precautions Trach Size  N/a Wound Vac n/a Skin lip laceration upper lip; skin dry and flaky, ecchymosis BUE and breast; MASD to breast Bowel mgmt:  Incontinent LBM 7/16 Bladder mgmt:  External catheter Diabetic mgmt:  New DM diagnosis this admit. Hgb A1c 9.2, Seen by Diabetes coordinator at [facility]; needs extensive DM teaching prior to d/c home form CIR Behavioral consideration  N/a Chemo/radiation  N/a Has not seen MD in years; needs PCP   Previous Home Environment  Living Arrangements: Spouse/significant other  Lives With: Spouse Available Help at Discharge: Family, Available 24 hours/day Type of Home: House Home Layout: One level Home Access: Stairs to enter, Ramped entrance Entrance Stairs-Rails: None Entrance Stairs-Number of Steps: 4-5 steps to front door; ramped entrance  to [employer] Shower/Tub: Tub/shower unit, Architectural technologist: Standard Bathroom Accessibility: Yes How Accessible: Accessible via walker [place]: No  Discharge Living Setting Plans for Discharge Living Setting: Patient's home, Lives with (comment)(spouse, Willie) Type of Home at Discharge: House Discharge Home Layout: One level Discharge Home Access: Stairs to enter Entrance Stairs-Number of Steps: 4-5 steps as well as a ramp Discharge Bathroom Shower/Tub: Tub/shower unit, Curtain Discharge Bathroom Toilet: Standard Discharge Bathroom Accessibility: Yes How Accessible: Accessible via walker Does the patient have any problems obtaining your medications?: No  Social/Family/Support Systems Patient Roles: Spouse(fulltime employee; no children; very close knit family with ) Contact Information: sister, Helene Kelp Day, main contact and then spouse, Izell Milton Anticipated Caregiver: spouse and local sisters Anticipated Caregiver's Contact Information: Helene Kelp [phone] lives in [place]; Izell Manzanola [phone] Ability/Limitations of Caregiver: spouse retired, has numerous sisters local  Caregiver Availability: 24/7 Discharge Plan Discussed with Primary Caregiver: Yes Is Caregiver In Agreement with Plan?: Yes Does Caregiver/Family have Issues with Lodging/Transportation while Pt is in Rehab?: No  Goals/Additional Needs Patient/Family Goal for Rehab: Mod I to supervision PT and OT, supervision SLP Expected length of stay: ELOS 10 to 12 days Special Service Needs: Patietn and family want to go home as soon as realistic due to [place] concerns Pt/Family Agrees to Admission and willing to participate: Yes Program Orientation Provided & Reviewed with Pt/Caregiver Including Roles  & Responsibilities: Yes  Decrease burden of Care through IP rehab admission: n/a  Possible need for SNF placement upon discharge:  Not anticipated  Patient Condition: I have reviewed medical  records from [facility], spoken with CSW, and patient and family member, sister, Helene Kelp Day. I discussed via phone for inpatient rehabilitation assessment.  Patient will benefit from ongoing PT, OT and SLP, can actively participate in 3 hours of therapy a day 5 days of the week, and can make measurable gains during the admission.  Patient will also benefit from the coordinated team approach during an Inpatient Acute Rehabilitation admission.  The patient will receive intensive therapy as well as Rehabilitation physician, nursing, social worker, and care management interventions.  Due to bladder management, bowel management, safety, skin/wound care, disease management, medication administration, pain management and patient education the patient requires 24 hour a day rehabilitation nursing.  The patient is currently mod assist with mobility and basic ADLs.  Discharge setting and therapy post discharge at home with home health is anticipated.  Patient has agreed to participate in the Acute Inpatient Rehabilitation Program and will admit today.  Preadmission Screen Completed By:  Cleatrice Burke RN MSN 06/09/2019 10:00 AM ______________________________________________________________________   Discussed status with Dr. Naaman Plummer on  06/09/2019 at  1000 and received approval for admission today.  Admission Coordinator:  Cleatrice Burke, RN, time  1000 Date  06/09/2019   Assessment/Plan: Diagnosis: right front-parietal infarct 1. Does the need for close, 24 hr/day Medical supervision in concert with the patient's rehab needs make it unreasonable for this patient to be served in a less intensive setting? Yes 2. Co-Morbidities requiring supervision/potential complications: seizure, post-infarct sequelae 3. Due to bladder management, bowel management, safety, skin/wound care, disease management, medication administration, pain management and patient education, does the patient require 24 hr/day rehab  nursing? Yes 4. Does the patient require coordinated care of a physician, rehab nurse, PT (1-2 hrs/day, 5 days/week), OT (1-2 hrs/day, 5 days/week) and SLP (1-2 hrs/day, 5 days/week) to address physical and functional deficits in the context of the above medical diagnosis(es)? Yes Addressing deficits in the following areas: balance, endurance, locomotion, strength, transferring, bowel/bladder control, bathing, dressing, feeding, grooming, toileting, cognition, swallowing and psychosocial support 5. Can the patient actively participate in an intensive therapy program of at least 3 hrs of therapy 5 days a week? Yes 6. The potential for patient to make measurable gains while on inpatient rehab is excellent 7. Anticipated functional outcomes upon discharge from inpatients are: modified independent and supervision PT, modified independent and supervision OT, supervision SLP 8. Estimated rehab length of stay to reach the above functional goals is: 10-12 days 9. Anticipated D/C setting: Home 10. Anticipated post D/C treatments: [place] therapy 11. Overall Rehab/Functional Prognosis: excellent  MD Signature: Meredith Staggers, MD, [facility] 06/09/2019         Revision History

## 2019-06-09 NOTE — Discharge Summary (Signed)
[facility] NAME: Tammy Nunez    MR#:  [id number]  DATE OF BIRTH:  [birth year]  DATE OF ADMISSION:  06/06/2019 ADMITTING PHYSICIAN: Hillary Bow, MD  DATE OF DISCHARGE: 06/09/2019  PRIMARY CARE PHYSICIAN: Patient, No Pcp Per   ADMISSION DIAGNOSIS:  Acute ischemic right MCA stroke ([place]) [I63.511] Carotid artery stenosis with cerebral infarction ([place]) [I63.239]  DISCHARGE DIAGNOSIS:  Active Problems:   Acute CVA (cerebrovascular accident) ([place])   SECONDARY DIAGNOSIS:  History reviewed. No pertinent past medical history.   ADMITTING HISTORY  HISTORY OF PRESENT ILLNESS:   Tammy Nunez  is a 68 y.o. female with no known past medical history presents to the emergency room complaining of acute onset of left-sided numbness and weakness from the time she woke up at 5:30 AM.  Slept at 10 PM when she was fine.  In the ER patient had a CT scan which shows right frontal and parietal stroke.  Seen by tele neurology.  Patient is being admitted for further stroke work-up.  She has no dysphagia or vision changes.  No shortness of breath or palpitations.  Not on aspirin or statin at home.  No history of A. fib.  Does smoke. Here her blood pressure is significantly elevated and also blood glucose is at 285.  No diagnosis of hypertension or diabetes. She has not seen a doctor in many years.  HOSPITAL COURSE:   * Acute cortical and subcortical infarctions in the right temporal parieto-occipital junction region and the right frontoparietal vertex Aspirin, Plavix, statin Echocardiogram with nothing acute CTA showed bilateral carotid stenosis PT/OT evaluation.  Inpatient rehab recommended Patient continues to have left-sided weakness.  *Partial seizures secondary to stroke Had twitching of left upper extremity and left eye. .  Started on Taylor. On 1000mg  BID PO. Needs f/u with neurology  *Bilateral carotid stenosis.  On aspirin,  Plavix, statin.   Vascular surgery has evaluated patient and will follow-up as outpatient for intervention initially on the right side and then left  *Hypertension.  Started lisinopril. Still not at ideal goal of less than 140/90 but would wait to increase dose or add further medications.  *Diabetes mellitus.  Hemoglobin A1c of 9.3.   Started on Lantus 8 units.  Continue sliding scale.  DVT prophylaxis with Lovenox in the hospital  Patient stable for discharge to inpatient rehab.  Accepted by Dr. Leroy Sea    CONSULTS OBTAINED:  Treatment Team:  Catarina Hartshorn, MD  DRUG ALLERGIES:  No Known Allergies  DISCHARGE MEDICATIONS:   Allergies as of 06/09/2019   No Known Allergies     Medication List    TAKE these medications   aspirin 81 MG EC tablet Take 1 tablet (81 mg total) by mouth daily. Start taking on: June 10, 2019   atorvastatin 40 MG tablet Commonly known as: LIPITOR Take 1 tablet (40 mg total) by mouth daily at 6 PM.   clopidogrel 75 MG tablet Commonly known as: PLAVIX Take 1 tablet (75 mg total) by mouth daily. Start taking on: June 10, 2019   ibuprofen 200 MG tablet Commonly known as: ADVIL Take 200-400 mg by mouth every 6 (six) hours as needed for fever or mild pain.   insulin glargine 100 UNIT/ML injection Commonly known as: LANTUS Inject 0.08 mLs (8 Units total) into the skin daily. Start taking on: June 10, 2019   levETIRAcetam 1000 MG tablet Commonly known as: KEPPRA Take 1 tablet (1,000 mg total) by  mouth 2 (two) times daily.   lisinopril 10 MG tablet Commonly known as: ZESTRIL Take 1 tablet (10 mg total) by mouth daily. Start taking on: June 10, 2019   vitamin B-12 1000 MCG tablet Commonly known as: CYANOCOBALAMIN Take 1,000 mcg by mouth daily.       Today   VITAL SIGNS:  Blood pressure (!) 162/69, pulse 81, temperature 98.4 F (36.9 C), temperature source Oral, resp. rate 18, height 4\' 4"  (1.321 m), weight 77.1 kg, SpO2  99 %.  I/O:    Intake/Output Summary (Last 24 hours) at 06/09/2019 0841 Last data filed at 06/09/2019 0018 Gross per 24 hour  Intake 600 ml  Output 500 ml  Net 100 ml    PHYSICAL EXAMINATION:  Physical Exam  GENERAL:  68 y.o.-year-old patient lying in the bed with no acute distress.  LUNGS: Normal breath sounds bilaterally, no wheezing, rales,rhonchi or crepitation. No use of accessory muscles of respiration.  CARDIOVASCULAR: S1, S2 normal. No murmurs, rubs, or gallops.  ABDOMEN: Soft, non-tender, non-distended. Bowel sounds present. No organomegaly or mass.  NEUROLOGIC:  Right :Upper extremity 5/5Left: Upper extremity 3-/5 Lower extremity 5/5Lower extremity 3-/5 PSYCHIATRIC: The patient is alert and oriented x 3.  SKIN: No obvious rash, lesion, or ulcer.   DATA REVIEW:   CBC Recent Labs  Lab 06/06/19 0636  WBC 9.6  HGB 16.3*  HCT 46.5*  PLT 211    Chemistries  Recent Labs  Lab 06/06/19 0636  NA 138  K 3.6  CL 102  CO2 20*  GLUCOSE 283*  BUN 12  CREATININE 0.94  CALCIUM 9.8  AST 26  ALT 22  ALKPHOS 86  BILITOT 0.9    Cardiac Enzymes No results for input(s): TROPONINI in the last 168 hours.  Microbiology Results  Results for orders placed or performed during the hospital encounter of 06/06/19  SARS Coronavirus 2 (CEPHEID - Performed in [place] hospital lab), Hosp Order     Status: None   Collection Time: 06/06/19  7:51 AM   Specimen: Nasopharyngeal Swab  Result Value Ref Range Status   SARS Coronavirus 2 NEGATIVE NEGATIVE Final    Comment: (NOTE) If result is NEGATIVE SARS-CoV-2 target nucleic acids are NOT DETECTED. The SARS-CoV-2 RNA is generally detectable in upper and lower  respiratory specimens during the acute phase of infection. The lowest  concentration of SARS-CoV-2 viral copies this assay can detect is 250  copies / mL. A  negative result does not preclude SARS-CoV-2 infection  and should not be used as the sole basis for treatment or other  patient management decisions.  A negative result may occur with  improper specimen collection / handling, submission of specimen other  than nasopharyngeal swab, presence of viral mutation(s) within the  areas targeted by this assay, and inadequate number of viral copies  (<250 copies / mL). A negative result must be combined with clinical  observations, patient history, and epidemiological information. If result is POSITIVE SARS-CoV-2 target nucleic acids are DETECTED. The SARS-CoV-2 RNA is generally detectable in upper and lower  respiratory specimens dur ing the acute phase of infection.  Positive  results are indicative of active infection with SARS-CoV-2.  Clinical  correlation with patient history and other diagnostic information is  necessary to determine patient infection status.  Positive results do  not rule out bacterial infection or co-infection with other viruses. If result is PRESUMPTIVE POSTIVE SARS-CoV-2 nucleic acids MAY BE PRESENT.   A presumptive positive result was obtained  on the submitted specimen  and confirmed on repeat testing.  While 2019 novel coronavirus  (SARS-CoV-2) nucleic acids may be present in the submitted sample  additional confirmatory testing may be necessary for epidemiological  and / or clinical management purposes  to differentiate between  SARS-CoV-2 and other Sarbecovirus currently known to infect humans.  If clinically indicated additional testing with an alternate test  methodology [phone]) is advised. The SARS-CoV-2 RNA is generally  detectable in upper and lower respiratory sp ecimens during the acute  phase of infection. The expected result is Negative. Fact Sheet for Patients:  StrictlyIdeas.no Fact Sheet for Healthcare Providers: BankingDealers.co.za This test is not  yet approved or cleared by the [place] FDA and has been authorized for detection and/or diagnosis of SARS-CoV-2 by FDA under an Emergency Use Authorization (EUA).  This EUA will remain in effect (meaning this test can be used) for the duration of the COVID-19 declaration under Section 564(b)(1) of the Act, 21 U.S.C. section 360bbb-3(b)(1), unless the authorization is terminated or revoked sooner. Performed at [facility], [address] [place], [place] [address]     RADIOLOGY:  No results found.  Follow up with PCP in 1 week.  Management plans discussed with the patient, family and they are in agreement.  CODE STATUS:     Code Status Orders  (From admission, onward)         Start     Ordered   06/06/19 0959  Full code  Continuous     06/06/19 1001        Code Status History    This patient has a current code status but no historical code status.   Advance Care Planning Activity      TOTAL TIME TAKING CARE OF THIS PATIENT ON DAY OF DISCHARGE: more than 30 minutes.   Neita Carp M.D on 06/09/2019 at 8:41 AM  Between 7am to 6pm - Pager - [phone]  After 6pm go to [email or web address] - password [email or web address] [facility]  Office  [phone]  CC: Primary care physician; Patient, No Pcp Per  Note: This dictation was prepared with Dragon dictation along with smaller phrase technology. Any transcriptional errors that result from this process are unintentional.

## 2019-06-09 NOTE — H&P (Signed)
Physical Medicine and Rehabilitation Admission H&P    Chief Complaint  Patient presents with  . Functional deficits due to stroke    HPI: Tammy Nunez is a 68 year old female relatively good health (but no medical care for years) who was admitted to [facility] on 06/06/2019 evening with left-sided numbness and weakness that started early a.m. patient's blood pressure was significantly elevated at 214/110 and her blood glucose was elevated at 285.  CTA head neck showed severe atherosclerotic disease at both carotid bifurcation and ICA bulbs with > 80% stenosis on the right and > 70% stenosis on the left.  MRI brain showed embolic infarcts in right MCA territory affecting temporal, parietal occipital junction and right frontoparietal vertex with minimal petechial blood products in right posterior temporal parietal occipital  junction without frank hematoma.  2D echo done revealing EF greater than 65% with severely increased left ventricular wall thickness.  Dr. Doy Mince felt that stroke was due to small vessel disease and recommended DAPT and vascular surgery was consulted for input.    Vascular recommended waiting 2 to 3 weeks and follow-up with Dr. Payton Mccallum for right carotid artery stenting in 2 weeks followed by left carotid artery stenting 6 weeks thereafter.  At admission, she was noted to have left facial and LUE twitching felt to be due to simple partial motor seizures therefore loaded with Keppra and started on 500 mg twice daily.  She continued to have ongoing symptoms therefore Keppra increased to 1000 mg bid.  Downgraded to dysphagia 3 chopped meat due to dental issues. Patient with right gaze preference with left neglect, decreased awareness of deficits, high-level cognitive deficits left-sided weakness with pusher tendencies.  CIR recommended due to functional deficits   Review of Systems  Constitutional: Negative for fever.  HENT: Negative for hearing loss and tinnitus.   Eyes:  Negative for blurred vision and double vision.  Respiratory: Negative for cough and shortness of breath.   Cardiovascular: Negative for chest pain and palpitations.  Gastrointestinal: Negative for constipation, heartburn and nausea.  Genitourinary: Negative for dysuria and urgency.  Musculoskeletal: Positive for joint pain (chronic left shoulder pain). Negative for back pain and myalgias.  Skin: Negative for itching and rash.  Neurological: Positive for sensory change and weakness. Negative for dizziness and headaches.  Psychiatric/Behavioral: Negative for memory loss. The patient does not have insomnia.      Past Medical History:  Diagnosis Date  . Chronic left shoulder pain      Past Surgical History:  Procedure Laterality Date  . ECTOPIC PREGNANCY SURGERY      Family History  Problem Relation Age of Onset  . Diabetes type II Mother   . Hypertension Father   . Diabetes type II Brother      Social History: married. Was working prior to admission. She reports that she has been smoking 1/2 PPD cigarettes PTA. She has never used smokeless tobacco. She reports that she does not drink alcohol or use drugs.    Allergies: No Known Allergies    Medications Prior to Admission  Medication Sig Dispense Refill  . [START ON 06/10/2019] aspirin EC 81 MG EC tablet Take 1 tablet (81 mg total) by mouth daily.    Marland Kitchen atorvastatin (LIPITOR) 40 MG tablet Take 1 tablet (40 mg total) by mouth daily at 6 PM.    . [START ON 06/10/2019] clopidogrel (PLAVIX) 75 MG tablet Take 1 tablet (75 mg total) by mouth daily.    Marland Kitchen ibuprofen (ADVIL)  200 MG tablet Take 200-400 mg by mouth every 6 (six) hours as needed for fever or mild pain.    Derrill Memo ON 06/10/2019] insulin glargine (LANTUS) 100 UNIT/ML injection Inject 0.08 mLs (8 Units total) into the skin daily. 10 mL 11  . levETIRAcetam (KEPPRA) 1000 MG tablet Take 1 tablet (1,000 mg total) by mouth 2 (two) times daily.    Derrill Memo ON 06/10/2019] lisinopril  (ZESTRIL) 10 MG tablet Take 1 tablet (10 mg total) by mouth daily.    . vitamin B-12 (CYANOCOBALAMIN) 1000 MCG tablet Take 1,000 mcg by mouth daily.      Drug Regimen Review  Drug regimen was reviewed and remains appropriate with no significant issues identified  Home: Home Living Family/patient expects to be discharged to:: Private residence Living Arrangements: Spouse/significant other Available Help at Discharge: Family, Available 24 hours/day Type of Home: House Home Access: Stairs to enter, Ramped entrance [employer] of Steps: 4-5 steps to front door; ramped entrance to [employer] Stairs-Rails: None Home Layout: One level Bathroom Shower/Tub: Tub/shower unit, Architectural technologist: Programmer, systems: Yes Home Equipment: None  Lives With: Spouse   Functional History: Prior Function Level of [place]: [place] Comments: Indep, no AD, only fall was in the ED this admission  Functional Status:  Mobility: Bed Mobility Overal bed mobility: Needs Assistance Bed Mobility: Supine to Sit, Rolling Rolling: Min assist Supine to sit: Min assist Sit to supine: Min assist General bed mobility comments: supine to sit on Right and Left side of bed x 2 with min assist and verbal cues.  some increased assist when fatigued but overall continues to do well. Transfers Overall transfer level: Needs assistance Equipment used: None Transfers: Sit to/from Stand Sit to Stand: Mod assist, +2 safety/equipment  Lateral/Scoot Transfers: Min assist General transfer comment: Min a x 1 to stand x 3 at edge of bed holding handrail for support.  Stood 30 seconds x 3 before fatigue Ambulation/Gait Ambulation/Gait assistance: Mod assist, +2 physical assistance Gait Distance (Feet): 5 Feet Assistive device: 2 person hand held assist General Gait Details: deferred- unable to coordinate +2 assist.    ADL: ADL Overall ADL's : Needs assistance/impaired  Eating/Feeding: Set up Eating/Feeding Details (indicate cue type and reason): long sitting in bed with pillow under LUE for optimal positioning/safety, pt required assist for set up of coffee (does not attempt to incorporate LUE into task of opening creamer container or sugar packets) and other tray items, initiall attending only to far R side of tray, with max verbal/visual cues pt improves attention to midline of tray Grooming: Sitting, Set up, Min guard Grooming Details (indicate cue type and reason): EOB with CGA and occasional cues for upright sitting balance (leans/pushes to L occasionally) and using R dom UE, pt able to perform but does not incorporate LUE into task - would require Max A for assist to incorporate LUE into task Upper Body Bathing: Moderate assistance Lower Body Bathing: Moderate assistance, Sitting/lateral leans Upper Body Dressing : Sitting, Moderate assistance Lower Body Dressing: Sitting/lateral leans, Moderate assistance Toilet Transfer: BSC, Stand-pivot, Moderate assistance  Cognition: Cognition Overall Cognitive Status: Within Functional Limits for tasks assessed Arousal/Alertness: Awake/alert Orientation Level: Oriented X4 Attention: Focused, Sustained Focused Attention: Appears intact Sustained Attention: Appears intact Memory: Appears intact Awareness: Appears intact Problem Solving: Appears intact(grossly for verbal tasks) Executive Function: Reasoning, Self Monitoring, Self Correcting Reasoning: Appears intact Self Monitoring: Appears intact Self Correcting: Appears intact Behaviors: (Left neglect, decreased awareness) Safety/Judgment: Appears intact(in discussion) Cognition  Arousal/Alertness: Awake/alert Behavior During Therapy: WFL for tasks assessed/performed Overall Cognitive Status: Within Functional Limits for tasks assessed Area of Impairment: Safety/judgement, Following commands, Problem solving Following Commands: Follows multi-step commands  with increased time Safety/Judgement: Decreased awareness of deficits, Decreased awareness of safety Problem Solving: Requires verbal cues, Requires tactile cues General Comments: oriented to self, situation, location; follows commands and verbalizes awareness of deficits, limitations and safety needs.  Limited functional insight into deficits, limited self-initiation of compensatory or recovery strategies   Blood pressure (!) 154/72, pulse 79, temperature 99.5 F (37.5 C), resp. rate 18, height 5\' 2"  (1.575 m), weight 77.1 kg, SpO2 95 %. Physical Exam  Nursing note and vitals reviewed. Constitutional: She is oriented to person, place, and time. She appears well-developed and well-nourished.  obese  HENT:  Head: Normocephalic and atraumatic.  Sutured laceration upper lip, well-approximated. Multiple teeth missing.   Eyes: Pupils are equal, round, and reactive to light. EOM are normal.  Neck: Normal range of motion. No tracheal deviation present. No thyromegaly present.  Cardiovascular: Normal rate and regular rhythm. Exam reveals no friction rub.  No murmur heard. Respiratory: Effort normal and breath sounds normal. No respiratory distress. She has no wheezes.  GI: Soft. She exhibits no distension. There is no abdominal tenderness.  Musculoskeletal:        General: No edema.  Neurological: She is alert and oriented to person, place, and time.  Mild dysarthria, left central 7. Cognitively demonstrates reasonable insight and awareness. Right gaze preference. LUE tr to 1/5 Left PEC, Bicep, 0/5 remaining LUE. LLE: 2+/5 HF, KE and 2 to 2+ ADF/PF. decreased sensation to LT LUE and LLE.      Skin: Skin is warm and dry.  Psychiatric: She has a normal mood and affect. Her behavior is normal.  Very pleasant    Results for orders placed or performed during the hospital encounter of 06/06/19 (from the past 48 hour(s))  Glucose, capillary     Status: Abnormal   Collection Time: 06/07/19  4:44 PM   Result Value Ref Range   Glucose-Capillary 153 (H) 70 - 99 mg/dL  Glucose, capillary     Status: Abnormal   Collection Time: 06/07/19  9:24 PM  Result Value Ref Range   Glucose-Capillary 193 (H) 70 - 99 mg/dL  Glucose, capillary     Status: Abnormal   Collection Time: 06/08/19  7:44 AM  Result Value Ref Range   Glucose-Capillary 161 (H) 70 - 99 mg/dL  Glucose, capillary     Status: Abnormal   Collection Time: 06/08/19 11:57 AM  Result Value Ref Range   Glucose-Capillary 235 (H) 70 - 99 mg/dL  Glucose, capillary     Status: Abnormal   Collection Time: 06/08/19  4:57 PM  Result Value Ref Range   Glucose-Capillary 190 (H) 70 - 99 mg/dL  Glucose, capillary     Status: Abnormal   Collection Time: 06/09/19  7:22 AM  Result Value Ref Range   Glucose-Capillary 218 (H) 70 - 99 mg/dL   No results found.     Medical Problem List and Plan: 1.  Functional deficits and left hemiparesis secondary to right MCA distribution embolic infarcts  -admit to inpatient rehab 2.  Antithrombotics: -DVT/anticoagulation:  Pharmaceutical: Lovenox  -antiplatelet therapy: ASA/Plavix 3. Pain Management: Tylenol prn 4. Mood: LCSW to follow for evaluation and support.   -antipsychotic agents: N/A 5. Neuropsych: This patient is capable of making decisions on her own behalf. 6. Skin/Wound Care: Routine pressure relief measures  7. Fluids/Electrolytes/Nutrition: Monitor I/O.   -I personally reviewed the patient's labs today.    -replace potassium (3.0) 8. T2DM: New diagnosis with Hgb A1c- 9.3. Educate on Carb modified diet. Change Lantus/novolog to low dose metformin titrate upwards as indicated.  Will continue to use sliding scale insulin for elevated blood sugars. 9. Bilateral carotid artery stenosis: Follow-up with vascular surgery after discharge 10. New onset simple partial seizures: Continue Keppra 1000 mg bid.  11. Morbid obesity: BMI 31.0--educated patient on importance of weight loss to help with  overall health and mobility. 12. HTN: Monitor BP tid--avoid hypotension.  13. Tobacco use: Continue to educate on importance of cessation.  14. Dyslipidemia: Continue statin        Bary Leriche, PA-C 06/09/2019

## 2019-06-09 NOTE — Progress Notes (Signed)
Subjective: Patient remains stable.  No further facial or LUE twitching noted.  Tolerating Keppra without noted side effects.    Objective: Current vital signs: BP (!) 162/69   Pulse 81   Temp 98.4 F (36.9 C) (Oral)   Resp 18   Ht 5\' 2"  (1.575 m)   Wt 77.1 kg   SpO2 99%   BMI 31.09 kg/m  Vital signs in last 24 hours: Temp:  [98.4 F (36.9 C)-99.2 F (37.3 C)] 98.4 F (36.9 C) (07/16 0354) Pulse Rate:  [81-88] 81 (07/16 0805) Resp:  [16-22] 18 (07/16 0805) BP: (149-164)/(61-86) 162/69 (07/16 0805) SpO2:  [95 %-100 %] 99 % (07/16 0805)  Intake/Output from previous day: 07/15 0701 - 07/16 0700 In: 600 [P.O.:600] Out: 500 [Urine:500] Intake/Output this shift: Total I/O In: 120 [P.O.:120] Out: -  Nutritional status:  Diet Order            DIET DYS 3 Room service appropriate? Yes with Assist; Fluid consistency: Thin  Diet effective now              Neurologic Exam: Mental Status: Alert, oriented, thought content appropriate. Speech fluent without evidence of aphasia. Able to follow 3 step commands without difficulty. Cranial Nerves: II:LHH III,IV, VI: ptosis not present, extra-ocular motions intact bilaterally V,VII:left facial droop, facial light touch sensation normal bilaterally.  VIII: hearing normal bilaterally IX,X: gag reflex present XI: bilateral shoulder shrug XII: midline tongue extension Motor: Right :Upper extremity 5/5Left: Upper extremity 3-/5 Lower extremity 5/5Lower extremity 3-/5 Tone and bulk:normal tone throughout; no atrophy noted Sensory: Pinprick and light touch intact throughout, bilaterally  Lab Results: Basic Metabolic Panel: Recent Labs  Lab 06/06/19 0636  NA 138  K 3.6  CL 102  CO2 20*  GLUCOSE 283*  BUN 12  CREATININE 0.94  CALCIUM 9.8    Liver Function Tests: Recent Labs  Lab 06/06/19 0636  AST 26   ALT 22  ALKPHOS 86  BILITOT 0.9  PROT 8.1  ALBUMIN 4.7   No results for input(s): LIPASE, AMYLASE in the last 168 hours. No results for input(s): AMMONIA in the last 168 hours.  CBC: Recent Labs  Lab 06/06/19 0636  WBC 9.6  NEUTROABS 5.8  HGB 16.3*  HCT 46.5*  MCV 87.2  PLT 211    Cardiac Enzymes: No results for input(s): CKTOTAL, CKMB, CKMBINDEX, TROPONINI in the last 168 hours.  Lipid Panel: Recent Labs  Lab 06/07/19 0836  CHOL 305*  TRIG 244*  HDL 39*  CHOLHDL 7.8  VLDL 49*  LDLCALC 217*    CBG: Recent Labs  Lab 06/07/19 2124 06/08/19 0744 06/08/19 1157 06/08/19 1657 06/09/19 0722  GLUCAP 193* 161* 235* 190* 218*    Microbiology: Results for orders placed or performed during the hospital encounter of 06/06/19  SARS Coronavirus 2 (CEPHEID - Performed in [facility] hospital lab), Hosp Order     Status: None   Collection Time: 06/06/19  7:51 AM   Specimen: Nasopharyngeal Swab  Result Value Ref Range Status   SARS Coronavirus 2 NEGATIVE NEGATIVE Final    Comment: (NOTE) If result is NEGATIVE SARS-CoV-2 target nucleic acids are NOT DETECTED. The SARS-CoV-2 RNA is generally detectable in upper and lower  respiratory specimens during the acute phase of infection. The lowest  concentration of SARS-CoV-2 viral copies this assay can detect is 250  copies / mL. A negative result does not preclude SARS-CoV-2 infection  and should not be used as the sole basis for treatment or  other  patient management decisions.  A negative result may occur with  improper specimen collection / handling, submission of specimen other  than nasopharyngeal swab, presence of viral mutation(s) within the  areas targeted by this assay, and inadequate number of viral copies  (<250 copies / mL). A negative result must be combined with clinical  observations, patient history, and epidemiological information. If result is POSITIVE SARS-CoV-2 target nucleic acids are DETECTED. The  SARS-CoV-2 RNA is generally detectable in upper and lower  respiratory specimens dur ing the acute phase of infection.  Positive  results are indicative of active infection with SARS-CoV-2.  Clinical  correlation with patient history and other diagnostic information is  necessary to determine patient infection status.  Positive results do  not rule out bacterial infection or co-infection with other viruses. If result is PRESUMPTIVE POSTIVE SARS-CoV-2 nucleic acids MAY BE PRESENT.   A presumptive positive result was obtained on the submitted specimen  and confirmed on repeat testing.  While 2019 novel coronavirus  (SARS-CoV-2) nucleic acids may be present in the submitted sample  additional confirmatory testing may be necessary for epidemiological  and / or clinical management purposes  to differentiate between  SARS-CoV-2 and other Sarbecovirus currently known to infect humans.  If clinically indicated additional testing with an alternate test  methodology [phone]) is advised. The SARS-CoV-2 RNA is generally  detectable in upper and lower respiratory sp ecimens during the acute  phase of infection. The expected result is Negative. Fact Sheet for Patients:  StrictlyIdeas.no Fact Sheet for Healthcare Providers: BankingDealers.co.za This test is not yet approved or cleared by the [place] FDA and has been authorized for detection and/or diagnosis of SARS-CoV-2 by FDA under an Emergency Use Authorization (EUA).  This EUA will remain in effect (meaning this test can be used) for the duration of the COVID-19 declaration under Section 564(b)(1) of the Act, 21 U.S.C. section 360bbb-3(b)(1), unless the authorization is terminated or revoked sooner. Performed at [facility], [address] [place], [place] [address]     Coagulation Studies: No results for input(s): LABPROT, INR in the last 72 hours.  Imaging: No results  found.  Medications:  I have reviewed the patient's current medications. Scheduled: . aspirin EC  81 mg Oral Daily  . atorvastatin  40 mg Oral q1800  . clopidogrel  75 mg Oral Daily  . enoxaparin (LOVENOX) injection  40 mg Subcutaneous Daily  . insulin aspart  0-9 Units Subcutaneous TID WC  . insulin aspart  3 Units Subcutaneous TID WC  . insulin glargine  8 Units Subcutaneous Daily  . levETIRAcetam  1,000 mg Oral BID  . lisinopril  10 mg Oral Daily    Assessment/Plan: 68 year old female s/p right hemispheric infarcts.  Developed simple partial seizures as well.  Started on Keppra.  Tolerating well. Patient on ASA, Plavix and statin.    Recommendations: 1. Agree with continuation of ASA, Plavix and statin.  Continue Keppra at 1000mg  BID 2. Frequent neuro checks 3. Telemetry 4. Continue seizure precautions 5. BP management with target SBP<140.       LOS: 2 days   Tammy Goodell, MD Neurology [phone] 06/09/2019  10:53 AM

## 2019-06-09 NOTE — Progress Notes (Signed)
Pt being transported now by [facility], no complaints noted

## 2019-06-09 NOTE — Progress Notes (Signed)
Report given to Victoria RN

## 2019-06-10 ENCOUNTER — Inpatient Hospital Stay (HOSPITAL_COMMUNITY): Payer: PPO | Admitting: Occupational Therapy

## 2019-06-10 ENCOUNTER — Inpatient Hospital Stay (HOSPITAL_COMMUNITY): Payer: PPO | Admitting: Speech Pathology

## 2019-06-10 ENCOUNTER — Inpatient Hospital Stay (HOSPITAL_COMMUNITY): Payer: PPO | Admitting: Physical Therapy

## 2019-06-10 DIAGNOSIS — I639 Cerebral infarction, unspecified: Secondary | ICD-10-CM

## 2019-06-10 DIAGNOSIS — G8114 Spastic hemiplegia affecting left nondominant side: Secondary | ICD-10-CM

## 2019-06-10 LAB — GLUCOSE, CAPILLARY
Glucose-Capillary: 195 mg/dL — ABNORMAL HIGH (ref 70–99)
Glucose-Capillary: 206 mg/dL — ABNORMAL HIGH (ref 70–99)
Glucose-Capillary: 144 mg/dL — ABNORMAL HIGH (ref 70–99)
Glucose-Capillary: 150 mg/dL — ABNORMAL HIGH (ref 70–99)

## 2019-06-10 MED ORDER — INSULIN ASPART 100 UNIT/ML ~~LOC~~ SOLN
0.0000 [IU] | Freq: Three times a day (TID) | SUBCUTANEOUS | Status: DC
  Administered 2019-06-10: 5 [IU] via SUBCUTANEOUS
  Administered 2019-06-10: 2 [IU] via SUBCUTANEOUS
  Administered 2019-06-11: 3 [IU] via SUBCUTANEOUS
  Administered 2019-06-11: 5 [IU] via SUBCUTANEOUS
  Administered 2019-06-11 – 2019-06-12 (×2): 3 [IU] via SUBCUTANEOUS
  Administered 2019-06-12: 1 [IU] via SUBCUTANEOUS
  Administered 2019-06-12: 3 [IU] via SUBCUTANEOUS
  Administered 2019-06-13 – 2019-06-16 (×7): 2 [IU] via SUBCUTANEOUS
  Administered 2019-06-16: 3 [IU] via SUBCUTANEOUS
  Administered 2019-06-17 – 2019-06-19 (×7): 2 [IU] via SUBCUTANEOUS
  Administered 2019-06-19 – 2019-06-20 (×2): 3 [IU] via SUBCUTANEOUS
  Administered 2019-06-20: 2 [IU] via SUBCUTANEOUS
  Administered 2019-06-20 – 2019-06-21 (×2): 3 [IU] via SUBCUTANEOUS
  Administered 2019-06-21: 2 [IU] via SUBCUTANEOUS
  Administered 2019-06-22: 3 [IU] via SUBCUTANEOUS
  Administered 2019-06-22 (×2): 2 [IU] via SUBCUTANEOUS
  Administered 2019-06-23: 3 [IU] via SUBCUTANEOUS
  Administered 2019-06-24 (×2): 2 [IU] via SUBCUTANEOUS
  Administered 2019-06-25: 3 [IU] via SUBCUTANEOUS
  Administered 2019-06-25: 2 [IU] via SUBCUTANEOUS
  Administered 2019-06-26 – 2019-06-27 (×2): 3 [IU] via SUBCUTANEOUS
  Administered 2019-06-28: 2 [IU] via SUBCUTANEOUS
  Administered 2019-06-28: 3 [IU] via SUBCUTANEOUS
  Administered 2019-06-29: 2 [IU] via SUBCUTANEOUS
  Administered 2019-06-29 – 2019-06-30 (×2): 3 [IU] via SUBCUTANEOUS
  Administered 2019-06-30: 2 [IU] via SUBCUTANEOUS
  Administered 2019-07-01: 3 [IU] via SUBCUTANEOUS

## 2019-06-10 MED ORDER — POTASSIUM CHLORIDE CRYS ER 20 MEQ PO TBCR: 20.0000 meq | EXTENDED_RELEASE_TABLET | Freq: Every day | ORAL | Status: DC

## 2019-06-10 MED ORDER — INSULIN ASPART 100 UNIT/ML ~~LOC~~ SOLN: 0.0000 [IU] | Freq: Every day | SUBCUTANEOUS | Status: DC

## 2019-06-10 MED ORDER — POTASSIUM CHLORIDE CRYS ER 20 MEQ PO TBCR
20.0000 meq | EXTENDED_RELEASE_TABLET | Freq: Two times a day (BID) | ORAL | Status: DC
  Administered 2019-06-10 – 2019-06-20 (×21): 20 meq via ORAL
  Filled 2019-06-10 (×21): qty 1

## 2019-06-10 NOTE — Evaluation (Signed)
Physical Therapy Assessment and Plan  Patient Details  Name: Tammy Nunez MRN: [id number] Date of Birth: [birth year]  PT Diagnosis: Abnormal posture, Abnormality of gait, Cognitive deficits, Difficulty walking, Hemiparesis non-dominant, Impaired cognition, Impaired sensation and Muscle weakness Rehab Potential: Good ELOS: 2.5-3 weeks   Today's Date: 06/10/2019 PT Individual Time: 7654-6503 PT Individual Time Calculation (min): 76 min    Problem List:  Patient Active Problem List   Diagnosis Date Noted  . Acute ischemic right MCA stroke ([place]) 06/09/2019  . Acute CVA (cerebrovascular accident) ([place]) 06/06/2019    Past Medical History:  Past Medical History:  Diagnosis Date  . Chronic left shoulder pain    Past Surgical History:  Past Surgical History:  Procedure Laterality Date  . ECTOPIC PREGNANCY SURGERY      Assessment & Plan Clinical Impression: Patient is a 68 y.o. year old female relatively good health(but no medical care for years)who was admitted to [facility] on 06/06/2019 evening with left-sided numbness and weakness that started early a.m.patient's blood pressure was significantly elevated at 214/110 and herblood glucose was elevated at 285. CTA head neck showed severe atherosclerotic disease at both carotid bifurcation and ICA bulbs with >80% stenosis on the right and>70% stenosis on the left. MRI brain showed embolic infarcts in right MCA territory affecting temporal, parietal occipital junction and right frontoparietal vertex with minimal petechial blood products in right posterior temporal parietaloccipital junction without frank hematoma.2D echo done revealing EF greater than 65% with severely increased left ventricular wall thickness.Dr. Clarita Leber that stroke was due to small vessel disease and recommended DAPTand vascularsurgery was consulted for input.  Vascular recommended waiting 2 to 3 weeks and follow-up with Dr. Payton Mccallum for right carotid  artery stenting in 2 weeks followed by left carotid artery stenting 6 weeks thereafter. At admission,she was noted to have left facial and LUE twitchingfelt to be due tosimplepartial motor seizures therefore loaded with Keppra and started on 528m twice daily. She continued to have ongoing symptoms therefore Keppra increased to 1000 mg bid.Downgraded to dysphagia 3 chopped meat due to dental issues. Patient with right gaze preference with left neglect,decreased awareness of deficits, high-level cognitive deficits left-sided weakness withpusher tendencies.CIR recommended due to functional deficits Patient transferred to CIR on 06/09/2019 .   Patient currently requires max with mobility secondary to muscle weakness, decreased cardiorespiratoy endurance, impaired timing and sequencing, abnormal tone, unbalanced muscle activation, decreased coordination and decreased motor planning, decreased midline orientation and decreased attention to left, decreased attention and decreased awareness and decreased sitting balance, decreased standing balance, decreased postural control and decreased balance strategies.  Prior to hospitalization, patient was independent  with mobility and lived with Spouse in a House home.  Home access is 4-5 steps to front door; ramped entrance to kitchenStairs to enter, Ramped entrance.  Patient will benefit from skilled PT intervention to maximize safe functional mobility, minimize fall risk and decrease caregiver burden for planned discharge home with 24 hour assist.  [facility]  PT - End of Session Activity Tolerance: Tolerates 30+ min activity with multiple rests Endurance Deficit: Yes PT Assessment Rehab Potential (ACUTE/IP ONLY): Good PT Patient demonstrates impairments in the following area(s): Balance;Perception;Sensory;Endurance;Motor PT Transfers Functional Problem(s): Bed Mobility;Bed to Chair;Car;Furniture PT  Locomotion Functional Problem(s): Ambulation;Wheelchair Mobility;Stairs PT Plan PT Intensity: Minimum of 1-2 x/day ,45 to 90 minutes PT Frequency: 5 out of 7 days PT Duration Estimated Length of Stay: 2.5-3 weeks PT Treatment/Interventions: Ambulation/gait training;Community reintegration;DME/adaptive equipment instruction;Neuromuscular re-education;Psychosocial  support;Stair training;UE/LE Strength taining/ROM;Wheelchair propulsion/positioning;Balance/vestibular training;Discharge planning;Functional electrical stimulation;Pain management;Skin care/wound management;Therapeutic Activities;UE/LE Coordination activities;Cognitive remediation/compensation;Functional mobility training;Disease management/prevention;Patient/family education;Splinting/orthotics;Therapeutic Exercise;Visual/perceptual remediation/compensation PT Transfers Anticipated Outcome(s): supervision transfers PT Locomotion Anticipated Outcome(s): CGA gait with LRAD PT Recommendation Recommendations for Other Services: Therapeutic Recreation consult;Neuropsych consult Therapeutic Recreation Interventions: Stress management Follow Up Recommendations: Home health PT;24 hour supervision/assistance Patient destination: Home Equipment Recommended: To be determined  Skilled Therapeutic Intervention Evaluation completed (see details above and below) with education on PT POC and goals and individual treatment initiated with focus on bed mobility, transfers, initiating gait, car transfer, and education regarding daily therapy schedule, weekly team meetings, purpose of PT evaluation, and other CIR information. Pt received sitting in w/c and agreeable to therapy session. Transported to/from gym in w/c. Stand pivot transfer w/c>EOM with mod assist for blocking L LE knee buckling and pivoting hips. Sit>supine with cuing for sequencing of reverse logroll technique to increase pt independence - max assist for trunk descent and B LE management.  Rolling R with min assist for L HB management and rolling L with CGA for safety. Supine>sit with mod assist for trunk upright and B LE management with cuing for sequencing of reverse logroll technique. Sit<>stand EOM<>no UE support with min/mod assist for lifting, balance, and L LE knee control. Stand pivot EOM>w/c towards L side with mod/max assist for pivoting hips and L knee control. Ambulated ~19f at hallway rail with R UE support and +2 assist for w/c follow with max assist for balance/trunk control and total assist for L LE swing and stance phase control to block knee buckling and prevent knee hyperextension. Stand pivot car transfer to/from w/c with max assist going towards L side and mod assist towards R side and min assist for getting L LE out of vehicle - cuing throughout for sequencing. Therapist provided pt with more narrow w/c and thicker cushion to provide improved posture/alignment and increased pressure relief for increased OOB activity tolerance. Transported back to room in w/c. Pt requesting to return to bed due to increased fatigue. Stand pivot w/c>EOB with mod/max assist for pivoting hips. Sit>supine with max assist for trunk descent and B LE management. Pt left supine in bed with needs in reach and bed alarm on.  PT Evaluation Precautions/Restrictions Precautions Precautions: Fall;Other (comment) Precaution Comments: L lateral lean and L knee buckles Restrictions Weight Bearing Restrictions: No Pain Pain Assessment Pain Scale: 0-10 Pain Score: 0-No pain Pain Intervention(s): Other (Comment)(therapy to tolerance) Home Living/Prior Functioning Home Living Living Arrangements: Spouse/significant other Available Help at Discharge: Family;Available 24 hours/day Type of Home: House Home Access: Stairs to enter;Ramped entrance Entrance Stairs-Number of Steps: 4-5 steps to front door; ramped entrance to kitchen Entrance Stairs-Rails: None Home Layout: One level Bathroom  Shower/Tub: TProduct/process development scientist Standard  Lives With: Spouse Prior Function Level of Independence: Independent with gait;Independent with homemaking with ambulation;Independent with transfers  Able to Take Stairs?: Yes Driving: Yes Vocation: Full time employment Leisure: Hobbies-yes (Comment) Comments: enjoys flower gardens,Independent, no AD, 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) Vision/Perception  Perception Perception: Impaired Inattention/Neglect: Does not attend to left visual field;Does not attend to left side of body Praxis Praxis: Impaired Praxis Impairment Details: Motor planning  Cognition Overall Cognitive Status: Impaired/Different from baseline Arousal/Alertness: Awake/alert Orientation Level: Oriented X4 Attention: Focused;Sustained Focused Attention: Appears intact Sustained Attention: Appears intact Safety/Judgment: Appears intact Sensation Sensation Light Touch: Impaired Detail Central sensation comments: reports diminished sensation in L LE compared to  R LE - decreased distally more than proximally Light Touch Impaired Details: Impaired LLE Hot/Cold: Not tested Proprioception: Impaired by gross assessment Stereognosis: Not tested Coordination Gross Motor Movements are Fluid and Coordinated: No Fine Motor Movements are Fluid and Coordinated: No Coordination and Movement Description: L hemiplegia  Finger Nose Finger Test: unable to with LUE, WFL on R Heel Shin Test: WFL on R LE and impaired on L LE due to strength and proprioceptive impairments Motor  Motor Motor: Hemiplegia;Abnormal tone;Abnormal postural alignment and control Motor - Skilled Clinical Observations: L hemiparesis  Mobility Bed Mobility Bed Mobility: Rolling Right;Rolling Left;Supine to Sit;Sit to Supine Rolling Right: Minimal Assistance - Patient > 75% Rolling Left: Contact Guard/Touching assist Supine to Sit: Moderate  Assistance - Patient 50-74% Sit to Supine: Maximal Assistance - Patient 25-49% Transfers Transfers: Sit to Stand;Stand Pivot Transfers;Stand to Sit Sit to Stand: Moderate Assistance - Patient 50-74% Stand to Sit: Moderate Assistance - Patient 50-74% Stand Pivot Transfers: Moderate Assistance - Patient 50 - 74%;Maximal Assistance - Patient 25 - 49% Transfer (Assistive device): None Locomotion  Gait Ambulation: Yes Gait Assistance: 2 Helpers(+2 w/c follow; max assist of 1 for balance and total assist for L LE management) Gait Distance (Feet): 15 Feet Assistive device: Other (Comment)(hallway rail) Gait Assistance Details: Tactile cues for sequencing;Tactile cues for weight shifting;Tactile cues for posture;Tactile cues for placement;Manual facilitation for weight bearing;Manual facilitation for placement;Manual facilitation for weight shifting;Verbal cues for technique;Verbal cues for sequencing;Verbal cues for precautions/safety;Verbal cues for gait pattern;Visual cues/gestures for sequencing Gait Assistance Details: total assist for L LE swing and stance phase Gait Gait: Yes Gait Pattern: Impaired Stairs / Additional Locomotion Stairs: No Wheelchair Mobility Wheelchair Mobility: No  Trunk/Postural Assessment  Cervical Assessment Cervical Assessment: Exceptions to WFL(forward head) Thoracic Assessment Thoracic Assessment: Exceptions to WFL(rounded shoulders) Lumbar Assessment Lumbar Assessment: Exceptions to WFL(posterior pelvic tilt in sitting) Postural Control Postural Control: Deficits on evaluation Trunk Control: fair trunk control in standing Righting Reactions: delayed and insufficient Protective Responses: impaired Postural Limitations: decreased  Balance Balance Balance Assessed: Yes Static Sitting Balance Static Sitting - Level of Assistance: 5: Stand by assistance Dynamic Sitting Balance Dynamic Sitting - Level of Assistance: 3: Mod assist Static Standing  Balance Static Standing - Level of Assistance: 3: Mod assist Dynamic Standing Balance Dynamic Standing - Level of Assistance: 1: +2 Total assist Extremity Assessment      RLE Assessment RLE Assessment: Within Functional Limits General Strength Comments: Grossly 5/5 throughout LLE Assessment LLE Assessment: Exceptions to [facility] LLE Strength Left Hip Flexion: 3-/5 Left Knee Flexion: 2/5 Left Knee Extension: 3/5 Left Ankle Dorsiflexion: 2-/5(inconsistent but with significant time/attention can perform) Left Ankle Plantar Flexion: 2-/5(inconsistent but with increased time and focus can perform)    Refer to Care Plan for Long Term Goals  Recommendations for other services: Neuropsych and Therapeutic Recreation  Stress management  Discharge Criteria: Patient will be discharged from PT if patient refuses treatment 3 consecutive times without medical reason, if treatment goals not met, if there is a change in medical status, if patient makes no progress towards goals or if patient is discharged from hospital.  The above assessment, treatment plan, treatment alternatives and goals were discussed and mutually agreed upon: by patient  Tawana Scale, PT, DPT 06/10/2019, 7:55 AM

## 2019-06-10 NOTE — Care Management Note (Signed)
Inpatient [facility] Individual Statement of Services  Patient Name:  Tammy Nunez  Date:  06/10/2019  Welcome to the [facility].  Our goal is to provide you with an individualized program based on your diagnosis and situation, designed to meet your specific needs.  With this comprehensive rehabilitation program, you will be expected to participate in at least 3 hours of rehabilitation therapies Monday-Friday, with modified therapy programming on the weekends.  Your rehabilitation program will include the following services:  Physical Therapy (PT), Occupational Therapy (OT), Speech Therapy (ST), 24 hour per day rehabilitation nursing, Neuropsychology, Case Management (Social Worker), Rehabilitation Medicine, Nutrition Services and Pharmacy Services  Weekly team conferences will be held on Wednesday to discuss your progress.  Your Social Worker will talk with you frequently to get your input and to update you on team discussions.  Team conferences with you and your family in attendance may also be held.  Expected length of stay: 18-21 days  Overall anticipated outcome: supervision-min assist  Depending on your progress and recovery, your program may change. Your Social Worker will coordinate services and will keep you informed of any changes. Your Social Worker's name and contact numbers are listed  below.  The following services may also be recommended but are not provided by the [facility] will be made to provide these services after discharge if needed.  Arrangements include referral to agencies that provide these services.  Your insurance has been verified to be:  [employer] Your primary doctor is:  None  Pertinent information will be shared with your doctor and your insurance  company.  Social Worker:  Ovidio Kin, Marie or ([phone]  Information discussed with and copy given to patient by: Elease Hashimoto, 06/10/2019, 12:40 PM

## 2019-06-10 NOTE — Progress Notes (Addendum)
Occupational Therapy Session Note  Patient Details  Name: Tammy Nunez MRN: [id number] Date of Birth: 05-26-1951  Today's Date: 06/10/2019 OT Individual Time: 2694-8546 OT Individual Time Calculation (min): 46 min    Short Term Goals: Week 1:  OT Short Term Goal 1 (Week 1): Pt will be able to sit to stand with min A during LB dressing. OT Short Term Goal 2 (Week 1): Pt will be able to transfer into shower with min A. OT Short Term Goal 3 (Week 1): Pt will complete toilet transfer with min A. OT Short Term Goal 4 (Week 1): Pt will be able to don shirt with min A. OT Short Term Goal 5 (Week 1): Pt will be able to don pants with mod A.  Skilled Therapeutic Interventions/Progress Updates:  Pt in wheelchair to start session.  Noted forward/right gaze preference while talking on the phone, but she did turn her head to the left once she heard therapist say "hello" as he entered the room.  Educated pt on self AAROM exercises for the LUE and provided handout for reference.  She was able to return demonstrate shoulder flexion, elbow flexion/extension, supination/pronation, wrist flexion/extension and digit flexion/extension, all with mod demonstrational cueing.  She exhibits Brunnstrum stage III movement in the left arm and hand currently with max assist needed to integrate into functional task.  She completed sit to stand at the sink with mod assist as well as short distance functional mobility with total assist from therapist for 2 intervals of 4-5 ft.  Decreased proprioception and strength noted in the LLE with max assist needed to facilitate left knee stability in weightbearing when stepping forward with the right.  Max assist also needed for advancement and placement of the LLE with stepping.  Finished session with pt in the wheelchair and call button and phone in reach with belt alarm in place.    Therapy Documentation Precautions:  Precautions Precautions: Fall Precaution Comments: L lateral  lean and L knee buckles Restrictions Weight Bearing Restrictions: No  Pain: Pain Assessment Pain Score: 0-No pain     Therapy/Group: Individual Therapy  , OTR/L 06/10/2019, 12:38 PM

## 2019-06-10 NOTE — Evaluation (Signed)
Speech Language Pathology Assessment and Plan  Patient Details  Name: Tammy Nunez MRN: [id number] Date of Birth: September 05, 1951  SLP Diagnosis: Cognitive Impairments;Dysphagia  Rehab Potential: Excellent ELOS: 2.5-3 weeks    Today's Date: 06/10/2019 SLP Individual Time: 0810-0905 SLP Individual Time Calculation (min): 55 min   Problem List:  Patient Active Problem List   Diagnosis Date Noted  . Acute ischemic right MCA stroke ([place]) 06/09/2019  . Acute CVA (cerebrovascular accident) ([place]) 06/06/2019   Past Medical History:  Past Medical History:  Diagnosis Date  . Chronic left shoulder pain    Past Surgical History:  Past Surgical History:  Procedure Laterality Date  . ECTOPIC PREGNANCY SURGERY      Assessment / Plan / Recommendation Clinical Impression Patient is a 68 year old femalerelatively good health(but no medical care for years)who was admitted to [facility] on 06/06/2019 evening with left-sided numbness and weakness that started early a.m.patient's blood pressure was significantly elevated at 214/110 and herblood glucose was elevated at 285. CTA head neck showed severe atherosclerotic disease at both carotid bifurcation and ICA bulbs with >80% stenosis on the right and>70% stenosis on the left. MRI brain showed embolic infarcts in right MCA territory affecting temporal, parietal occipital junction and right frontoparietal vertex with minimal petechial blood products in right posterior temporal parietaloccipital junction without frank hematoma.2D echo done revealing EF greater than 65% with severely increased left ventricular wall thickness.Dr. Clarita Leber that stroke was due to small vessel disease and recommended DAPTand vascularsurgery was consulted for input.Vascular recommended waiting 2 to 3 weeks and follow-up with Dr. Payton Mccallum for right carotid artery stenting in 2 weeks followed by left carotid artery stenting 6 weeks thereafter. At admission,she was  noted to have left facial and LUE twitchingfelt to be due tosimplepartial motor seizures therefore loaded with Keppra and started on 555m twice daily. She continued to have ongoing symptoms therefore Keppra increased to 1000 mg bid.Downgraded to dysphagia 3 chopped meat due to dental issues. Patient with right gaze preference with left neglect,decreased awareness of deficits, high-level cognitive deficits left-sided weakness withpusher tendencies.CIR recommended due to functional deficits and patient admitted 06/09/19.  Patient demonstrates mild cognitive impairments characterized by impaired intellectual awareness, functional problem solving and attention to left field of environment/visual scanning. Patient consumed thin liquids and demonstrated a swift swallow response without overt s/s of aspiration and demonstrated efficient mastication of solid textures despite missing dentition. Recommend patient upgrade to Dys. 3 textures with intermittent supervision to assist with self-feeding due to left inattention. Patient would benefit from skilled SLP intervention to maximize her cognitive and swallowing function prior to discharge.    Skilled Therapeutic Interventions          Administered a BSE and cognitive-linguistic evaluation, please see above for details. Educated patient in regards to current swallowing and cognitive deficits and goals of skilled SLP intervention. She verbalized understanding and agreement.     SLP Assessment  Patient will need skilled Speech Lanaguage Pathology Services during CIR admission    Recommendations  SLP Diet Recommendations: Dysphagia 3 (Mech soft);Thin Liquid Administration via: Straw Medication Administration: Whole meds with liquid Supervision: Patient able to self feed;Staff to assist with self feeding;Intermittent supervision to cue for compensatory strategies Compensations: Minimize environmental distractions;Slow rate;Small sips/bites;Lingual sweep  for clearance of pocketing;Monitor for anterior loss;Follow solids with liquid Postural Changes and/or Swallow Maneuvers: Seated upright 90 degrees;Upright 30-60 min after meal Oral Care Recommendations: Oral care BID Recommendations for Other Services: Neuropsych consult Patient destination: Home Follow up Recommendations:  Home Health SLP;24 hour supervision/assistance Equipment Recommended: None recommended by SLP    SLP Frequency 3 to 5 out of 7 days   SLP Duration  SLP Intensity  SLP Treatment/Interventions 2.5-3 weeks  Minumum of 1-2 x/day, 30 to 90 minutes  Cognitive remediation/compensation;Dysphagia/aspiration precaution training;Therapeutic Activities;Environmental controls;Cueing hierarchy;Functional tasks;Patient/family education;Internal/external aids    Pain Pain Assessment Pain Scale: 0-10 Pain Score: 0-No pain Pain Intervention(s): Other (Comment)(therapy to tolerance)  Prior Functioning Type of Home: House  Lives With: Spouse Available Help at Discharge: Family;Available 24 hours/day Vocation: Full time employment  Short Term Goals: Week 1: SLP Short Term Goal 1 (Week 1): Patient will demonstrate efficient mastication with complete oral clearance with trials of regular textures of 2 sessions prior to upgrade. SLP Short Term Goal 2 (Week 1): Patient will demonstrate complex problem solving for familiar tasks with supervision verbal cues. SLP Short Term Goal 3 (Week 1): Patient will self-monitor and correct errors during functional tasks with supervision verbal cues. SLP Short Term Goal 4 (Week 1): Patient will scan to left field of enviornment during functional tasks with Mod A verbal cues.  Refer to Care Plan for Long Term Goals  Recommendations for other services: Neuropsych  Discharge Criteria: Patient will be discharged from SLP if patient refuses treatment 3 consecutive times without medical reason, if treatment goals not met, if there is a change in  medical status, if patient makes no progress towards goals or if patient is discharged from hospital.  The above assessment, treatment plan, treatment alternatives and goals were discussed and mutually agreed upon: by patient  Brockton Mckesson 06/10/2019, 3:26 PM

## 2019-06-10 NOTE — Evaluation (Signed)
Occupational Therapy Assessment and Plan  Patient Details  Name: Tammy Nunez MRN: [id number] Date of Birth: 02/17/1951  OT Diagnosis: apraxia, cognitive deficits, disturbance of vision and hemiplegia affecting non-dominant side Rehab Potential: Rehab Potential (ACUTE ONLY): Excellent ELOS: 18- 21 days   Today's Date: 06/10/2019 OT Individual Time: 7253-6644 OT Individual Time Calculation (min): 46 min     Problem List:  Patient Active Problem List   Diagnosis Date Noted  . Acute ischemic right MCA stroke ([place]) 06/09/2019  . Acute CVA (cerebrovascular accident) ([place]) 06/06/2019    Past Medical History:  Past Medical History:  Diagnosis Date  . Chronic left shoulder pain    Past Surgical History:  Past Surgical History:  Procedure Laterality Date  . ECTOPIC PREGNANCY SURGERY      Assessment & Plan Clinical Impression: Tammy Nunez is a 68 year old female relatively good health (but no medical care for years) who was admitted to [facility] on 06/06/2019 evening with left-sided numbness and weakness that started early a.m. patient's blood pressure was significantly elevated at 214/110 and her blood glucose was elevated at 285.  CTA head neck showed severe atherosclerotic disease at both carotid bifurcation and ICA bulbs with > 80% stenosis on the right and > 70% stenosis on the left.  MRI brain showed embolic infarcts in right MCA territory affecting temporal, parietal occipital junction and right frontoparietal vertex with minimal petechial blood products in right posterior temporal parietal occipital  junction without frank hematoma.  2D echo done revealing EF greater than 65% with severely increased left ventricular wall thickness.  Tammy Nunez felt that stroke was due to small vessel disease and recommended DAPT and vascular surgery was consulted for input.     Vascular recommended waiting 2 to 3 weeks and follow-up with Tammy Nunez for right carotid artery stenting in 2 weeks  followed by left carotid artery stenting 6 weeks thereafter.  At admission, she was noted to have left facial and LUE twitching felt to be due to simple partial motor seizures therefore loaded with Keppra and started on 500 mg twice daily.  She continued to have ongoing symptoms therefore Keppra increased to 1000 mg bid.  Downgraded to dysphagia 3 chopped meat due to dental issues. Patient with right gaze preference with left neglect, decreased awareness of deficits, high-level cognitive deficits left-sided weakness with pusher tendencies.  CIR recommended due to functional deficits  Patient transferred to CIR on 06/09/2019 .    Patient currently requires mod to max with basic self-care skills secondary to muscle weakness, decreased cardiorespiratoy endurance, abnormal tone, motor apraxia, decreased coordination and decreased motor planning, decreased visual perceptual skills, decreased visual motor skills and field cut, decreased midline orientation and decreased attention to left, decreased awareness and decreased problem solving and decreased sitting balance, decreased standing balance, decreased postural control, hemiplegia and decreased balance strategies.  Prior to hospitalization, patient was fully independent and working.  Patient will benefit from skilled intervention to increase independence with basic self-care skills prior to discharge home with care partner.  Anticipate patient will require 24 hour supervision and follow up home health.  OT - End of Session Activity Tolerance: Tolerates 10 - 20 min activity with multiple rests Endurance Deficit: Yes OT Assessment Rehab Potential (ACUTE ONLY): Excellent OT Patient demonstrates impairments in the following area(s): Balance;Cognition;Endurance;Motor;Perception;Safety;Vision OT Basic ADL's Functional Problem(s): Eating;Grooming;Bathing;Dressing;Toileting OT Transfers Functional Problem(s): Toilet;Tub/Shower OT Additional Impairment(s):  Fuctional Use of Upper Extremity OT Plan OT Intensity: Minimum of 1-2 x/day, 45 to 90  minutes OT Frequency: 5 out of 7 days OT Duration/Estimated Length of Stay: 18- 21 days OT Treatment/Interventions: Balance/vestibular training;Cognitive remediation/compensation;Discharge planning;DME/adaptive equipment instruction;Functional mobility training;Functional electrical stimulation;Neuromuscular re-education;Patient/family education;Psychosocial support;Therapeutic Activities;Self Care/advanced ADL retraining;Therapeutic Exercise;UE/LE Strength taining/ROM;UE/LE Coordination activities;Visual/perceptual remediation/compensation OT Self Feeding Anticipated Outcome(s): Independent OT Basic Self-Care Anticipated Outcome(s): S OT Toileting Anticipated Outcome(s): S OT Bathroom Transfers Anticipated Outcome(s): S OT Recommendation Recommendations for Other Services: Therapeutic Recreation consult Therapeutic Recreation Interventions: Other (comment)(gardening) Patient destination: Home Follow Up Recommendations: Home health OT Equipment Recommended: Tub/shower bench   Skilled Therapeutic Intervention Pt seen for initial evaluation and ADL training to include grooming, shower, toileting and dressing. See ADL documentation below.  Discussed role of OT, pt's goals, ELOS.  Pt participated very well and is motivated to do her rehab. Pt resting in wc with belt alarm on and Tammy Nunez in room with pt.  OT Evaluation Precautions/Restrictions  Precautions Precautions: Fall Precaution Comments: L lateral lean and L knee buckles  Pain Pain Assessment Pain Score: 0-No pain Home Living/Prior Functioning Home Living Family/patient expects to be discharged to:: Private residence Living Arrangements: Alone Available Help at Discharge: Family, Available 24 hours/day Type of Home: House Home Access: Stairs to enter, Ramped entrance [employer] of Steps: 4-5 steps to front door; ramped entrance to  [employer] Stairs-Rails: None Home Layout: One level Bathroom Shower/Tub: Tub/shower unit, Architectural technologist: Standard  Lives With: Spouse Prior Function Level of Independence: Independent with basic ADLs, Independent with homemaking with ambulation, Independent with gait  Able to Take Stairs?: Yes Vocation: Full time employment Leisure: Hobbies-yes (Comment)(gardening) Comments: Indep, no AD, only fall was in the ED this admission ADL ADL Eating: Set up Grooming: Setup Upper Body Bathing: Minimal assistance Where Assessed-Upper Body Bathing: Shower Lower Body Bathing: Moderate assistance Where Assessed-Lower Body Bathing: Shower Upper Body Dressing: Maximal assistance Where Assessed-Upper Body Dressing: Wheelchair Lower Body Dressing: Maximal assistance Where Assessed-Lower Body Dressing: Wheelchair Toileting: Maximal assistance Where Assessed-Toileting: Glass blower/designer: Moderate assistance Toilet Transfer Method: Squat pivot Toilet Transfer Equipment: Energy manager: Moderate assistance Social research officer, government Method: Engineer, agricultural with back, Grab bars Vision Baseline Vision/History: No visual deficits Patient Visual Report: Peripheral vision impairment Vision Assessment?: Yes Eye Alignment: Within Functional Limits Ocular Range of Motion: Restricted on the left Alignment/Gaze Preference: Gaze right Tracking/Visual Pursuits: Decreased smoothness of vertical tracking;Decreased smoothness of horizontal tracking;Unable to hold eye position out of midline Convergence: Impaired (comment)(R eye did not converge) Visual Fields: (45 degree field cut) Depth Perception: (appears [facility]) Perception  Inattention/Neglect: Does not attend to left side of body;Does not attend to left visual field Praxis Praxis: Impaired Praxis Impairment Details: Motor planning Praxis-Other Comments: mild  apraxia Cognition Overall Cognitive Status: Impaired/Different from baseline Arousal/Alertness: Awake/alert Orientation Level: Person;Place;Situation Person: Oriented Place: Oriented Situation: Oriented Year: 2020 Month: July Day of Week: Correct Memory: Appears intact Immediate Memory Recall: Sock;Blue;Bed Memory Recall Sock: Without Cue Memory Recall Blue: Without Cue Memory Recall Bed: Without Cue Focused Attention: Appears intact Sustained Attention: Appears intact Awareness: Impaired Awareness Impairment: Intellectual impairment(decreased L side awareness) Problem Solving: Impaired Problem Solving Impairment: Functional basic(for problem solving L hemiparesis management) Sensation Sensation Light Touch: Appears Intact Hot/Cold: Appears Intact Proprioception: Impaired by gross assessment Stereognosis: Impaired by gross assessment Coordination Gross Motor Movements are Fluid and Coordinated: No Fine Motor Movements are Fluid and Coordinated: No Coordination and Movement Description: L hemiplegia with developing AROM Finger Nose Finger Test: unable to with LUE, [facility]  on R Motor  Motor Motor: Hemiplegia Mobility    mod A overall with sit to stand, standing, squat pivot transfers Trunk/Postural Assessment  Cervical Assessment Cervical Assessment: Within Functional Limits Thoracic Assessment Thoracic Assessment: Exceptions to WFL(rounded shoulders) Lumbar Assessment Lumbar Assessment: Exceptions to WFL(posterior pelvic tilt) Postural Control Postural Control: Deficits on evaluation Trunk Control: can hold static sit, L lean with wt shift to L with decreased awareness of L lean Righting Reactions: delayed to L Protective Responses: delayed to L  Balance Static Sitting Balance Static Sitting - Level of Assistance: 5: Stand by assistance Dynamic Sitting Balance Dynamic Sitting - Level of Assistance: 3: Mod assist Static Standing Balance Static Standing - Level of  Assistance: 4: Min assist Dynamic Standing Balance Dynamic Standing - Level of Assistance: 2: Max assist Extremity/Trunk Assessment RUE Assessment RUE Assessment: Within Functional Limits LUE Assessment Passive Range of Motion (PROM) Comments: WFL - tight in shoulder due to increased tone Active Range of Motion (AROM) Comments: 80 sh abduction, can move partially into sh flexion but then arm abducts, partial elbow flex with sh abd, no active wrist or hand LUE Body System: Neuro Brunstrum levels for arm and hand: Arm;Hand Brunstrum level for arm: Stage II Synergy is developing Brunstrum level for hand: Stage I Flaccidity LUE Tone LUE Tone: Moderate(elbow flexors and shoulder)     Refer to Care Plan for Long Term Goals  Recommendations for other services: Therapeutic Recreation  Other gardening   Discharge Criteria: Patient will be discharged from OT if patient refuses treatment 3 consecutive times without medical reason, if treatment goals not met, if there is a change in medical status, if patient makes no progress towards goals or if patient is discharged from hospital.  The above assessment, treatment plan, treatment alternatives and goals were discussed and mutually agreed upon: by patient  [facility] 06/10/2019, 12:36 PM

## 2019-06-10 NOTE — Progress Notes (Addendum)
[place] PHYSICAL MEDICINE & REHABILITATION PROGRESS NOTE   Subjective/Complaints:  No issues overnite Working with SLP  ROS- neg CP SOB N/V/D  Objective:   No results found. Recent Labs    06/09/19 1455  WBC 8.8  HGB 15.0  HCT 43.4  PLT 168   Recent Labs    06/09/19 1455  NA 138  K 3.0*  CL 103  CO2 22  GLUCOSE 155*  BUN 19  CREATININE 0.99  CALCIUM 9.4    Intake/Output Summary (Last 24 hours) at 06/10/2019 0700 Last data filed at 06/09/2019 1500 Gross per 24 hour  Intake 240 ml  Output -  Net 240 ml     Physical Exam: Vital Signs Blood pressure (!) 174/65, pulse 73, temperature 98.4 F (36.9 C), temperature source Oral, resp. rate 18, height 5\' 2"  (1.575 m), weight 77.6 kg, SpO2 97 %.  General: No acute distress Mood and affect are appropriate Heart: Regular rate and rhythm no rubs murmurs or extra sounds Lungs: Clear to auscultation, breathing unlabored, no rales or wheezes Abdomen: Positive bowel sounds, soft nontender to palpation, nondistended Extremities: No clubbing, cyanosis, or edema Skin: No evidence of breakdown, no evidence of rash Neurologic: Cranial nerves II through XII intact, motor strength is 2-/5 in left , bicep, tricep,2-, hip flexor, knee extensors, ankle dorsiflexor and plantar flexor Sensory exam reduced sensation to light touch and proprioception in bilateral upper and lower extremities Cerebellar exam normal finger to nose to finger as well as heel to shin in bilateral upper and lower extremities Musculoskeletal: Full range of motion in all 4 extremities. No joint swelling    Assessment/Plan: 1. Functional deficits secondary to Left Hemi R MCA which require 3+ hours per day of interdisciplinary therapy in a comprehensive inpatient rehab setting.  Physiatrist is providing close team supervision and 24 hour management of active medical problems listed below.  Physiatrist and rehab team continue to assess barriers to  discharge/monitor patient progress toward functional and medical goals  Care Tool:  Bathing              Bathing assist       Upper Body Dressing/Undressing Upper body dressing Upper body dressing/undressing activity did not occur (including orthotics): Safety/medical concerns What is the patient wearing?: Hospital gown only    Upper body assist      Lower Body Dressing/Undressing Lower body dressing      What is the patient wearing?: Incontinence brief     Lower body assist Assist for lower body dressing: Moderate Assistance - Patient 50 - 74%     Toileting Toileting    Toileting assist Assist for toileting: Moderate Assistance - Patient 50 - 74%     Transfers Chair/bed transfer  Transfers assist           Locomotion Ambulation   Ambulation assist   Ambulation activity did not occur: N/A  Assist level: Maximal Assistance - Patient 25 - 49%       Walk 10 feet activity   Assist           Walk 50 feet activity   Assist           Walk 150 feet activity   Assist           Walk 10 feet on uneven surface  activity   Assist           Wheelchair     Assist  Wheelchair 50 feet with 2 turns activity    Assist            Wheelchair 150 feet activity     Assist          Medical Problem List and Plan: 1.Functional deficits and left hemiparesissecondary to right MCA distribution embolic infarcts CIR evals PT, OT, SLP 2. Antithrombotics: -DVT/anticoagulation:Pharmaceutical:Lovenox -antiplatelet therapy: ASA/Plavix 3. Pain Management:Tylenol prn 4. Mood:LCSW to follow for evaluation and support. -antipsychotic agents: N/A 5. Neuropsych: This patientiscapable of making decisions on herown behalf. 6. Skin/Wound Care:Routine pressure relief measures 7. Fluids/Electrolytes/Nutrition:Monitor I/O. -I personally reviewed  the patient's labs today. -replace potassium (3.0) 8. T2DM: New diagnosis with Hgb A1c-9.3. Educate on Carb modified diet. Change Lantus/novolog to low dose metformin titrate upwardsas indicated. Will continue to use sliding scale insulin for elevated blood sugars. CBG (last 3)  Recent Labs    06/09/19 1654 06/09/19 2112 06/10/19 0622  GLUCAP 208* 192* 195*  Elevated will adjust 9. Bilateral carotid artery stenosis:Follow-up with vascular surgery after discharge 10. New onsetsimple partialseizures: Continue Keppra 1000 mg bid. 11. Morbid obesity: BMI31.0--educated patient on importance of weight loss to help with overall health and mobility. 12. HTN: Monitor BP tid--avoid hypotension.  13. Tobacco use: Continue to educate on importance of cessation.  14. Dyslipidemia:Continue statin #15 hypokalemia KCl  LOS: 1 days A FACE TO FACE EVALUATION WAS PERFORMED  Charlett Blake 06/10/2019, 7:00 AM

## 2019-06-10 NOTE — Plan of Care (Signed)
Nutrition Education Note  RD consulted for nutrition education regarding diabetes and a heart healthy diet.  Spoke with pt at bedside. Pt in good spirits and appreciative of RD visit.  Discussed pt's typical PO intake PTA. Pt reports that she usually eats 2 full meals and 1 "snack" daily.  Breakfast: eggs, bacon, cheese toast Lunch ("snack"): tuna on crackers Dinner: grilled chicken, potatoes, greens, peas  Pt reports that she drinks mostly water and will occasionally drink soda or juice.  Lab Results  Component Value Date   HGBA1C 9.3 (H) 06/07/2019    RD provided "Carbohydrate Counting for People with Diabetes" handout from the Academy of Nutrition and Dietetics. Discussed different food groups and their effects on blood sugar, emphasizing carbohydrate-containing foods. Provided list of carbohydrates and recommended serving sizes of common foods.  Discussed importance of controlled and consistent carbohydrate intake throughout the day. Provided examples of ways to balance meals/snacks and encouraged intake of high-fiber, whole grain complex carbohydrates. Teach back method used.  Expect good compliance.  Body mass index is 31.29 kg/m. Pt meets criteria for obesity class I based on current BMI.  Current diet order is Dysphagia 3, patient is consuming approximately 50-90% of meals at this time. Labs and medications reviewed. No further nutrition interventions warranted at this time. RD contact information provided. If additional nutrition issues arise, please re-consult RD.   Gaynell Face, MS, RD, LDN Inpatient Clinical Dietitian Pager: [phone] Weekend/After Hours: [phone]

## 2019-06-10 NOTE — Plan of Care (Signed)
  Problem: RH BLADDER ELIMINATION Goal: RH STG MANAGE BLADDER WITH ASSISTANCE Description: STG Manage Bladder With Mod I Assistance Outcome: Progressing   Problem: RH SKIN INTEGRITY Goal: RH STG SKIN FREE OF INFECTION/BREAKDOWN Description: No new breakdown with supervision cues/assist  Outcome: Progressing   Problem: RH COGNITION-NURSING Goal: RH STG ANTICIPATES NEEDS/CALLS FOR ASSIST W/ASSIST/CUES Description: STG Anticipates Needs/Calls for Assist With supervision Assistance/Cues. Outcome: Progressing   Problem: RH KNOWLEDGE DEFICIT Goal: RH STG INCREASE KNOWLEDGE OF DIABETES Description: Pt will be able to verbalize understanding of blood sugar monitoring, dietary and medical management of diabetes, and importance of follow up care with physician at discharge.  Outcome: Progressing

## 2019-06-10 NOTE — Progress Notes (Signed)
Orthopedic Tech Progress Note Patient Details:  Tammy Nunez Sep 10, 1951 [id number]  Patient ID: Tammy Nunez, female   DOB: [birth year], 68 y.o.   MRN: [id number]   Tammy Nunez 06/10/2019, 4:22 PMCalled [facility] for left Pra[employer] boot.

## 2019-06-10 NOTE — Progress Notes (Signed)
Social Work Assessment and Plan   Patient Details  Name: Tammy Nunez MRN: [id number] Date of Birth: 01/19/1951  Today's Date: 06/10/2019  Problem List:  Patient Active Problem List   Diagnosis Date Noted  . Acute ischemic right MCA stroke ([place]) 06/09/2019  . Acute CVA (cerebrovascular accident) ([place]) 06/06/2019   Past Medical History:  Past Medical History:  Diagnosis Date  . Chronic left shoulder pain    Past Surgical History:  Past Surgical History:  Procedure Laterality Date  . ECTOPIC PREGNANCY SURGERY     Social History:  reports that she has been smoking cigarettes. She has a 7.50 pack-year smoking history. She has never used smokeless tobacco. She reports that she does not drink alcohol or use drugs.  Family / Support Systems Marital Status: Married Patient Roles: Spouse, Other (Comment)(employee) Spouse/Significant Other: Willie [phone] Other Supports: Berenice Primas [phone] in [place] Anticipated Caregiver: Husband and local sister's along with extended family Ability/Limitations of Caregiver: husband is retired and in good health Caregiver Availability: 24/7 Family Dynamics: Close knit family she has numerous sister's who are involved and will assist along with her husband. They have good social supports and church members. 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 She does want her husband involved also   Abuse/Neglect Abuse/Neglect Assessment Can Be Completed: Yes Physical Abuse: Denies Verbal Abuse:  Denies Sexual Abuse: Denies Exploitation of patient/patient's resources: Denies Self-Neglect: Denies  Emotional Status Pt's affect, behavior and adjustment status: Pt is motivated to do well, she has always been very independent and able to take care of herself and does not like asking others for help. She is usually the one to assist others. She feels she can reach mod/i goals and can get back to doing for herself Recent Psychosocial Issues: pt thought she was healthy until this, but has not gone to a MD in years. Psychiatric History: No history deferred depression screen due to coping appropriately and able to verbalize her concerns. She would benefit from seeing neuro-psych while here due to how independent she was prior to admission Substance Abuse History: Tobacco aware needs to quit smoking due to health issues. She plans to and is aware of the resources available to her  Patient / Family Perceptions, Expectations & Goals Pt/Family understanding of illness & functional limitations: Pt and husband can explain her stroke and her deficits. Both have spoken with the MD and feel their questions have been addressed. Pt is encouraged by the progress she has made and glad to be here on the rehab unit. 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."  [employer]: None Premorbid Home Care/DME Agencies: None Transportation available at discharge: Husband and family-pt was driving prior to admission Resource referrals recommended: Neuropsychology, Support group (specify)  Discharge Planning Living Arrangements: Spouse/significant other Support Systems: Spouse/significant other, Other  relatives, Friends/neighbors,  Church/faith community Type of Residence: Private residence Insurance Resources: Multimedia programmer (specify)(Health Team Advantage) Financial Resources: Employment, Family Support Financial Screen Referred: No Living Expenses: Own Money Management: Spouse, Patient Does the patient have any problems obtaining your medications?: Yes (Describe)(Didn't go to the MD) Home Management: Both Patient/Family Preliminary Plans: Return home with husband who is retired and able to assist if needed. Pt wants to be able to do for herself and not need his help. She does realize she needs to quit smoking and go a MD now. She feels this is her wake up call and now she will need to make changes for her health Social Work Anticipated Follow Up Needs: HH/OP, Support Group  Clinical Impression Pleasant female whom is motivated to regain her independence, she was working full time prior to admission. Her husband is able to assist along with her sister's. Do feel she would benefit from seeing neuro-psych while here. Will make referral and await therapy team evaluations.   Elease Hashimoto 06/10/2019, 12:55 PM

## 2019-06-11 ENCOUNTER — Inpatient Hospital Stay (HOSPITAL_COMMUNITY): Payer: PPO | Admitting: Occupational Therapy

## 2019-06-11 ENCOUNTER — Inpatient Hospital Stay (HOSPITAL_COMMUNITY): Payer: PPO | Admitting: Speech Pathology

## 2019-06-11 ENCOUNTER — Inpatient Hospital Stay (HOSPITAL_COMMUNITY): Payer: PPO

## 2019-06-11 LAB — GLUCOSE, CAPILLARY
Glucose-Capillary: 169 mg/dL — ABNORMAL HIGH (ref 70–99)
Glucose-Capillary: 155 mg/dL — ABNORMAL HIGH (ref 70–99)
Glucose-Capillary: 210 mg/dL — ABNORMAL HIGH (ref 70–99)
Glucose-Capillary: 143 mg/dL — ABNORMAL HIGH (ref 70–99)

## 2019-06-11 NOTE — Progress Notes (Signed)
Speech Language Pathology Daily Session Note  Patient Details  Name: Tammy Nunez MRN: [id number] Date of Birth: 1951/06/12  Today's Date: 06/11/2019 SLP Individual Time: 1030-1110 SLP Individual Time Calculation (min): 40 min  Short Term Goals: Week 1: SLP Short Term Goal 1 (Week 1): Patient will demonstrate efficient mastication with complete oral clearance with trials of regular textures of 2 sessions prior to upgrade. SLP Short Term Goal 2 (Week 1): Patient will demonstrate complex problem solving for familiar tasks with supervision verbal cues. SLP Short Term Goal 3 (Week 1): Patient will self-monitor and correct errors during functional tasks with supervision verbal cues. SLP Short Term Goal 4 (Week 1): Patient will scan to left field of enviornment during functional tasks with Mod A verbal cues.  Skilled Therapeutic Interventions: Skilled treatment session focused on cognitive goals. SLP facilitated session by providing Max A verbal and visual cues for functional problem solving and scanning to left field of environment during a basic money management task. Patient appeared to demonstrate increased difficulty with cognitive tasks compared to yesterday and verbalized, "I'm getting confused" X 3 throughout session. Patient left upright in wheelchair with alarm on and all needs within reach. Continue with current plan of care.      Pain Pain Assessment Pain Score: 0-No pain  Therapy/Group: Individual Therapy  Tanja Gift 06/11/2019, 12:58 PM

## 2019-06-11 NOTE — IPOC Note (Signed)
Overall Plan of Care [facility]) Patient Details Name: Tammy Nunez MRN: [id number] DOB: [birth year]  Admitting Diagnosis: <principal problem not specified>  Hospital Problems: Active Problems:   Acute ischemic right MCA stroke [facility])     Functional Problem List: Nursing Bladder, Endurance, Medication Management, Nutrition, Safety, Skin Integrity  PT Balance, Perception, Sensory, Endurance, Motor  OT Balance, Cognition, Endurance, Motor, Perception, Safety, Vision  SLP Cognition, Nutrition  TR         Basic ADL's: OT Eating, Grooming, Bathing, Dressing, Toileting     Advanced  ADL's: OT       Transfers: PT Bed Mobility, Bed to Chair, Car, Manufacturing systems engineer, Metallurgist: PT Ambulation, Emergency planning/management officer, Stairs     Additional Impairments: OT Fuctional Use of Upper Extremity  SLP Swallowing, Social Cognition   Awareness, Problem Solving  TR      Anticipated Outcomes Item Anticipated Outcome  Self Feeding Independent  Swallowing  Mod I   Basic self-care  S  Toileting  S   Bathroom Transfers S  Bowel/Bladder  Pt will manage bowel and bladder with min assist  Transfers  supervision transfers  Locomotion  CGA gait with LRAD  Communication     Cognition  Mod I  Pain  Pt will manage pain at 3 or less on a scale of 0-10  Safety/Judgment  Pt will remain free of falls with injury while in rehab with min assist/ cues   Therapy Plan: PT Intensity: Minimum of 1-2 x/day ,45 to 90 minutes PT Frequency: 5 out of 7 days PT Duration Estimated Length of Stay: 2.5-3 weeks OT Intensity: Minimum of 1-2 x/day, 45 to 90 minutes OT Frequency: 5 out of 7 days OT Duration/Estimated Length of Stay: 18- 21 days SLP Intensity: Minumum of 1-2 x/day, 30 to 90 minutes SLP Frequency: 3 to 5 out of 7 days SLP Duration/Estimated Length of Stay: 2.5-3 weeks   Due to the current state of emergency, patients may not be receiving their 3-hours of Medicare-mandated  therapy.   Team Interventions: Nursing Interventions Patient/Family Education, Bladder Management, Bowel Management, Disease Management/Prevention  PT interventions Ambulation/gait training, Community reintegration, DME/adaptive equipment instruction, Neuromuscular re-education, Psychosocial support, Stair training, UE/LE Strength taining/ROM, Wheelchair propulsion/positioning, Training and development officer, Discharge planning, Functional electrical stimulation, Pain management, Skin care/wound management, Therapeutic Activities, UE/LE Coordination activities, Cognitive remediation/compensation, Functional mobility training, Disease management/prevention, Patient/family education, Splinting/orthotics, Therapeutic Exercise, Visual/perceptual remediation/compensation  OT Interventions Balance/vestibular training, Cognitive remediation/compensation, Discharge planning, DME/adaptive equipment instruction, Functional mobility training, Functional electrical stimulation, Neuromuscular re-education, Patient/family education, Psychosocial support, Therapeutic Activities, Self Care/advanced ADL retraining, Therapeutic Exercise, UE/LE Strength taining/ROM, UE/LE Coordination activities, Visual/perceptual remediation/compensation  SLP Interventions Cognitive remediation/compensation, Dysphagia/aspiration precaution training, Therapeutic Activities, Environmental controls, Cueing hierarchy, Functional tasks, Patient/family education, Internal/external aids  TR Interventions    SW/CM Interventions Discharge Planning, Psychosocial Support, Patient/Family Education   Barriers to Discharge MD  Medical stability and Pending surgery  Nursing      PT      OT      SLP      SW       Team Discharge Planning: Destination: PT-Home ,OT- Home , SLP-Home Projected Follow-up: PT-Home health PT, 24 hour supervision/assistance, OT-  Home health OT, SLP-Home Health SLP, 24 hour supervision/assistance Projected Equipment  Needs: PT-To be determined, OT- Tub/shower bench, SLP-None recommended by SLP Equipment Details: PT- , OT-  Patient/family involved in discharge planning: PT- Patient,  OT-Patient, SLP-Patient  MD ELOS: 10-12d Medical Rehab Prognosis:  Good Assessment:  68 year old femalerelatively good health(but no medical care for years)who was [facility] to [facility] on 06/06[facility]2019 evening with left-sided numbness and weakness that started early a.m.patient's blood pressure was significantly elevated at 214/110 and herblood glucose was elevated at 285. CTA head neck showed severe atherosclerotic disease at both carotid bifurcation and ICA bulbs with >80% stenosis on the right and>70% stenosis on the left. MRI brain showed embolic infarcts in right MCA territory affecting temporal, parietal occipital junction and right frontoparietal vertex with minimal petechial blood products in right posterior temporal parietaloccipital junction without frank hematoma.2D echo done revealing EF greater than 65% with severely increased left ventricular wall thickness.Dr. Clarita Leber that stroke was due to small vessel disease and recommended DAPTand vascularsurgery was consulted for input.  Vascular recommended waiting 2 to 3 weeks and follow-up with Dr. Payton Mccallum for right carotid artery stenting in 2 weeks followed by left carotid artery stenting 6 weeks thereafter. At admission,she was noted to have left facial and LUE twitchingfelt to be due tosimplepartial motor seizures therefore loaded with Keppra and started on 500mg  twice daily. She continued to have ongoing symptoms therefore Keppra increased to 1000 mg bid.Downgraded to dysphagia 3 chopped meat due to dental issues. Patient with right gaze preference with left neglect,decreased awareness of deficits, high-level cognitive deficits left-sided weakness withpusher tendencies   See Team Conference Notes for weekly updates to the plan of care  Now  requiring 24/7 Rehab RN,MD, as well as CIR level PT, OT and SLP.  Treatment team will focus on ADLs and mobility with goals set at ModI/Sup

## 2019-06-11 NOTE — Progress Notes (Signed)
[place] PHYSICAL MEDICINE & REHABILITATION PROGRESS NOTE   Subjective/Complaints:  No issues overnite  Per OT moving better  ROS- neg CP SOB N/V/D  Objective:   No results found. Recent Labs    06/09/19 1455  WBC 8.8  HGB 15.0  HCT 43.4  PLT 168   Recent Labs    06/09/19 1455  NA 138  K 3.0*  CL 103  CO2 22  GLUCOSE 155*  BUN 19  CREATININE 0.99  CALCIUM 9.4    Intake/Output Summary (Last 24 hours) at 06/11/2019 0849 Last data filed at 06/11/2019 0257 Gross per 24 hour  Intake 270 ml  Output 250 ml  Net 20 ml     Physical Exam: Vital Signs Blood pressure (!) 162/96, pulse 81, temperature 99.1 F (37.3 C), temperature source Oral, resp. rate 18, height 5\' 2"  (1.575 m), weight 77.6 kg, SpO2 95 %.  General: No acute distress Mood and affect are appropriate Heart: Regular rate and rhythm no rubs murmurs or extra sounds Lungs: Clear to auscultation, breathing unlabored, no rales or wheezes Abdomen: Positive bowel sounds, soft nontender to palpation, nondistended Extremities: No clubbing, cyanosis, or edema Skin: No evidence of breakdown, no evidence of rash Neurologic: Cranial nerves II through XII intact, motor strength is 2-/5 in left , bicep, tricep,2- finger flexors , 2- hip flexor, knee extensors, ankle dorsiflexor and plantar flexor  Musculoskeletal: Full range of motion in all 4 extremities. No joint swelling    Assessment/Plan: 1. Functional deficits secondary to Left Hemi R MCA which require 3+ hours per day of interdisciplinary therapy in a comprehensive inpatient rehab setting.  Physiatrist is providing close team supervision and 24 hour management of active medical problems listed below.  Physiatrist and rehab team continue to assess barriers to discharge/monitor patient progress toward functional and medical goals  Care Tool:  Bathing    Body parts bathed by patient: Chest, Abdomen, Left arm, Front perineal area, Right upper leg, Left  upper leg, Buttocks, Face   Body parts bathed by helper: Right arm, Left lower leg, Right lower leg     Bathing assist Assist Level: Moderate Assistance - Patient 50 - 74%     Upper Body Dressing/Undressing Upper body dressing Upper body dressing/undressing activity did not occur (including orthotics): Safety/medical concerns What is the patient wearing?: Pull over shirt    Upper body assist Assist Level: Maximal Assistance - Patient 25 - 49%    Lower Body Dressing/Undressing Lower body dressing      What is the patient wearing?: Underwear/pull up, Pants     Lower body assist Assist for lower body dressing: Maximal Assistance - Patient 25 - 49%     Toileting Toileting    Toileting assist Assist for toileting: Maximal Assistance - Patient 25 - 49%     Transfers Chair/bed transfer  Transfers assist     Chair/bed transfer assist level: Moderate Assistance - Patient 50 - 74%     Locomotion Ambulation   Ambulation assist      Assist level: 2 helpers Assistive device: Other (comment)(hallway rail) Max distance: 28ft   Walk 10 feet activity   Assist     Assist level: 2 helpers Assistive device: Other (comment)(hallway rail)   Walk 50 feet activity   Assist Walk 50 feet with 2 turns activity did not occur: Safety/medical concerns         Walk 150 feet activity   Assist Walk 150 feet activity did not occur: Safety/medical concerns  Walk 10 feet on uneven surface  activity   Assist Walk 10 feet on uneven surfaces activity did not occur: Safety/medical concerns         Wheelchair     Assist Will patient use wheelchair at discharge?: (TBD)             Wheelchair 50 feet with 2 turns activity    Assist            Wheelchair 150 feet activity     Assist          Medical Problem List and Plan: 1.Functional deficits and left hemiparesissecondary to right MCA distribution embolic  infarcts CIR evals PT, OT, SLP 2. Antithrombotics: -DVT/anticoagulation:Pharmaceutical:Lovenox -antiplatelet therapy: ASA/Plavix 3. Pain Management:Tylenol prn 4. Mood:LCSW to follow for evaluation and support. -antipsychotic agents: N/A 5. Neuropsych: This patientiscapable of making decisions on herown behalf. 6. Skin/Wound Care:Routine pressure relief measures 7. Fluids/Electrolytes/Nutrition:Monitor I/O. -I personally reviewed the patient's labs today. -replace potassium (3.0) 8. T2DM: New diagnosis with Hgb A1c-9.3. Educate on Carb modified diet. Change Lantus/novolog to low dose metformin titrate upwardsas indicated. Will continue to use sliding scale insulin for elevated blood sugars. CBG (last 3)  Recent Labs    06/10/19 1552 06/10/19 2107 06/11/19 0608  GLUCAP 144* 150* 169*  Elevated cont metformin and SSI  9. Bilateral carotid artery stenosis:Follow-up with vascular surgery after discharge 10. New onsetsimple partialseizures: Continue Keppra 1000 mg bid. 11. Morbid obesity: BMI31.0--educated patient on importance of weight loss to help with overall health and mobility. 12. HTN: Monitor BP tid--avoid hypotension.  Vitals:   06/10/19 2001 06/10/19 2130  BP: (!) 156/112 (!) 162/96  Pulse: 81   Resp: 18   Temp: 99.1 F (37.3 C)   SpO2: 95%   occ above goal started Zestril yesterday monitor  13. Tobacco use: Continue to educate on importance of cessation.  14. Dyslipidemia:Continue statin #15 hypokalemia KCl  LOS: 2 days A FACE TO FACE EVALUATION WAS PERFORMED  Charlett Blake 06/11/2019, 8:49 AM

## 2019-06-11 NOTE — Progress Notes (Addendum)
Occupational Therapy Session Note  Patient Details  Name: Tammy Nunez MRN: [id number] Date of Birth: 11-Sep-1951  Today's Date: 06/11/2019 OT Individual Time: 1324-4010 OT Individual Time Calculation (min): 75 min    Short Term Goals: Week 1:  OT Short Term Goal 1 (Week 1): Pt will be able to sit to stand with min A during LB dressing. OT Short Term Goal 2 (Week 1): Pt will be able to transfer into shower with min A. OT Short Term Goal 3 (Week 1): Pt will complete toilet transfer with min A. OT Short Term Goal 4 (Week 1): Pt will be able to don shirt with min A. OT Short Term Goal 5 (Week 1): Pt will be able to don pants with mod A.  Skilled Therapeutic Interventions/Progress Updates:      Pt seen for BADL retraining of grooming, toileting, bathing, and dressing with a focus on postural control, L side awareness, motor planning, and guided use of LUE.  Pt worked on rolling to L side in bed with knees bent and guided to bring R shoulder forward to move from sidelying to sit with mod A. Sat at EOB with S and then squat pivot to wc to R with min - mod A. Standard height wc very difficult for her to get into as she did not have the leg length to push her hips back. Pt sat at sink to brush teeth. Demonstrated to pt how to open toothpaste with one hand and apply toothpaste. Pt able to do so.   Pt taken into bathroom and transferred to toilet with mod A. Pt needs mod A for balance as she weight shifted to her L to self cleanse.   W/c changed to a low profile seat to allow her to use her feet to position self in chair.   Mod A back to wc and then to shower seat using bar. Pt provided with bathmit for L hand. Hand over hand guidance to use LUE to wash abdomen and RUE.   When pt was wt shifting to L to wash bottom in sitting she began to slip forward on seat.  Pt at risk for slipping off seat with soapy water so had pt use grab bar and stand with min A for therapist to assist washing her bottom.   Pt  able to reach to R foot by picking up foot.  Therapist held pt's L foot over R knee for her so she could wash LLE.     Pt transferred back to w/c to dress.   Mod A with UB dressing with mod imp motor planning impacted by L inattention.  Pt able to don LB clothing over L leg with mod A and then she could place R leg in.  Sit to stand and standing with min a using elevated bed rail.  Therapist adjusted clothing over hips.  Pt worked on active L knee extension. In sitting, active dorsiflexion. Encouraged pt to continue to working on this.  Pt has also developed increased tone in finger wrist flexors with some active finger flexion.  PROM intact, worked on LUE stretching.  Pt in room with belt alarm on and all needs met.      Therapy Documentation Precautions:  Precautions Precautions: Fall, Other (comment) Precaution Comments: L lateral lean and L knee buckles Restrictions Weight Bearing Restrictions: No  Pain: Pain Assessment Pain Score: 0-No pain ADL: ADL Eating: Set up Grooming: Setup Upper Body Bathing: Minimal assistance Where Assessed-Upper Body Bathing: Shower Lower  Body Bathing: Moderate assistance Where Assessed-Lower Body Bathing: Shower Upper Body Dressing: Moderate assistance Where Assessed-Upper Body Dressing: Wheelchair Lower Body Dressing: Maximal assistance Where Assessed-Lower Body Dressing: Wheelchair Toileting: Maximal assistance Where Assessed-Toileting: Glass blower/designer: Moderate assistance Toilet Transfer Method: Squat pivot Toilet Transfer Equipment: Energy manager: Moderate assistance Social research officer, government Method: Education officer, environmental: Shower seat with back, Grab bars  Therapy/Group: Individual Therapy  [place] 06/11/2019, 9:44 AM

## 2019-06-11 NOTE — Progress Notes (Signed)
Physical Therapy Session Note  Patient Details  Name: Tammy Nunez MRN: [id number] Date of Birth: [birth year]  Today's Date: 06/11/2019 PT Individual Time: 1547-1700 PT Individual Time Calculation (min): 73 min   Short Term Goals: Week 1:  PT Short Term Goal 1 (Week 1): Pt will perform supine<>sit with mod assist PT Short Term Goal 2 (Week 1): Pt will perform sit<>stand with min assist PT Short Term Goal 3 (Week 1): Pt will perform bed<>chair transfers with min assist PT Short Term Goal 4 (Week 1): Pt will ambulate 80ft using LRAD with max assist  Skilled Therapeutic Interventions/Progress Updates:    Pt supine in bed upon PT arrival, agreeable to therapy tx and denies pain. Pt transferred to sitting with min assist and performed stand pivot to w/c with mod assist. Pt transported to the gym, therapist adjusted leg rest for L LE. Pt propelled ultra hemi-height w/c this session x 100 ft with min assist, using R LE and cues for techniques. Pt transferred w/c<>mat with mod assist stand pivot. Pt transferred into regular hemi height w/c without a seat cushion to try propelling w/c, x 150 ft with min assist and continued cues for attention to L side. Pt transferred back to mat min assist stand pivot. Pt performed x 3 sit<>stands this session without AD and min-mod assist, pt with poor quad activation/knee control in standing despite tactile/verbal cues and mirror for visual feedback, requiring up to max assist for L knee block. Pt performed x 3 steps in place with R LE without AD working on pre-gait and L stance control, pt requiring max assist for L knee block and balance with increased L lateral lean. Pt worked on standing this session with use of RW and L hand orthosis, in standing worked on L quad activation, midline and pre-gait. Sitting edge of mat pt performed 2 x 10 LAQ for strength and neuro re-ed. Worked on standing balance and L quad activation in standing without AD, max cues for attention to  L LE, pt able to activate quads and maintain static balance with min assist. Pt worked on timing on L quad activation with R stepping in place, mirror for visual feedback and max verbal/tactile cues for L quad activation. Standing with RW pt worked on L neuro re-ed and swing phase in order to perform task kicking a yoga block, x 5 kicks with up to mod assist for balance. PT transported back to room and left in w/c with needs in reach and chair alarm set.   Therapy Documentation Precautions:  Precautions Precautions: Fall, Other (comment) Precaution Comments: L lateral lean and L knee buckles Restrictions Weight Bearing Restrictions: No   Therapy/Group: Individual Therapy  Netta Corrigan, PT, DPT 06/11/2019, 11:58 AM

## 2019-06-11 NOTE — Plan of Care (Signed)
  Problem: Consults Goal: RH STROKE PATIENT EDUCATION Description: See Patient Education module for education specifics  Outcome: Progressing   Problem: RH BLADDER ELIMINATION Goal: RH STG MANAGE BLADDER WITH ASSISTANCE Description: STG Manage Bladder With Mod I Assistance Outcome: Progressing   Problem: RH SKIN INTEGRITY Goal: RH STG SKIN FREE OF INFECTION/BREAKDOWN Description: No new breakdown with supervision cues/assist  Outcome: Progressing

## 2019-06-12 ENCOUNTER — Inpatient Hospital Stay (HOSPITAL_COMMUNITY): Payer: PPO | Admitting: Occupational Therapy

## 2019-06-12 LAB — GLUCOSE, CAPILLARY
Glucose-Capillary: 161 mg/dL — ABNORMAL HIGH (ref 70–99)
Glucose-Capillary: 171 mg/dL — ABNORMAL HIGH (ref 70–99)
Glucose-Capillary: 131 mg/dL — ABNORMAL HIGH (ref 70–99)
Glucose-Capillary: 139 mg/dL — ABNORMAL HIGH (ref 70–99)

## 2019-06-12 MED ORDER — METFORMIN HCL 850 MG PO TABS
850.0000 mg | ORAL_TABLET | Freq: Two times a day (BID) | ORAL | Status: DC
  Administered 2019-06-12 – 2019-06-14 (×4): 850 mg via ORAL
  Filled 2019-06-12 (×4): qty 1

## 2019-06-12 NOTE — Progress Notes (Signed)
[facility] PHYSICAL MEDICINE & REHABILITATION PROGRESS NOTE   Subjective/Complaints: Discussed CBG and BP as well as meds   ROS- neg CP SOB N/V/D  Objective:   No results found. Recent Labs    06/09/19 1455  WBC 8.8  HGB 15.0  HCT 43.4  PLT 168   Recent Labs    06/09/19 1455  NA 138  K 3.0*  CL 103  CO2 22  GLUCOSE 155*  BUN 19  CREATININE 0.99  CALCIUM 9.4    Intake/Output Summary (Last 24 hours) at 06/12/2019 0905 Last data filed at 06/12/2019 0830 Gross per 24 hour  Intake 360 ml  Output -  Net 360 ml     Physical Exam: Vital Signs Blood pressure (!) 160/66, pulse 80, temperature 98.5 F (36.9 C), resp. rate 18, height 5\' 2"  (1.575 m), weight 77.6 kg, SpO2 98 %.  General: No acute distress HEENT R upper lip sutures intact well approximated vermillion border with mild swelling  Mood and affect are appropriate Heart: Regular rate and rhythm no rubs murmurs or extra sounds Lungs: Clear to auscultation, breathing unlabored, no rales or wheezes Abdomen: Positive bowel sounds, soft nontender to palpation, nondistended Extremities: No clubbing, cyanosis, or edema Skin: No evidence of breakdown, no evidence of rash Neurologic: Cranial nerves II through XII intact, motor strength is 2-/5 in left , bicep, tricep,2- finger flexors , 2- hip flexor, knee extensors, ankle dorsiflexor and plantar flexor  Musculoskeletal: Full range of motion in all 4 extremities. No joint swelling    Assessment/Plan: 1. Functional deficits secondary to Left Hemi R MCA which require 3+ hours per day of interdisciplinary therapy in a comprehensive inpatient rehab setting.  Physiatrist is providing close team supervision and 24 hour management of active medical problems listed below.  Physiatrist and rehab team continue to assess barriers to discharge/monitor patient progress toward functional and medical goals  Care Tool:  Bathing    Body parts bathed by patient: Chest, Abdomen,  Left arm, Front perineal area, Right upper leg, Left upper leg, Face, Right lower leg, Left lower leg   Body parts bathed by helper: Right arm, Buttocks     Bathing assist Assist Level: Moderate Assistance - Patient 50 - 74%     Upper Body Dressing/Undressing Upper body dressing Upper body dressing/undressing activity did not occur (including orthotics): Safety/medical concerns What is the patient wearing?: Pull over shirt    Upper body assist Assist Level: Maximal Assistance - Patient 25 - 49%    Lower Body Dressing/Undressing Lower body dressing      What is the patient wearing?: Pants, Incontinence brief     Lower body assist Assist for lower body dressing: Total Assistance - Patient < 25%     Toileting Toileting    Toileting assist Assist for toileting: Maximal Assistance - Patient 25 - 49%     Transfers Chair/bed transfer  Transfers assist     Chair/bed transfer assist level: Moderate Assistance - Patient 50 - 74%     Locomotion Ambulation   Ambulation assist      Assist level: 2 helpers Assistive device: Other (comment)(hallway rail) Max distance: 13ft   Walk 10 feet activity   Assist     Assist level: 2 helpers Assistive device: Other (comment)(hallway rail)   Walk 50 feet activity   Assist Walk 50 feet with 2 turns activity did not occur: Safety/medical concerns         Walk 150 feet activity   Assist Walk 150  feet activity did not occur: Safety/medical concerns         Walk 10 feet on uneven surface  activity   Assist Walk 10 feet on uneven surfaces activity did not occur: Safety/medical concerns         Wheelchair     Assist Will patient use wheelchair at discharge?: (TBD) Type of Wheelchair: Manual    Wheelchair assist level: Minimal Assistance - Patient > 75% Max wheelchair distance: 150 ft    Wheelchair 50 feet with 2 turns activity    Assist        Assist Level: Minimal Assistance - Patient >  75%   Wheelchair 150 feet activity     Assist     Assist Level: Minimal Assistance - Patient > 75%    Medical Problem List and Plan: 1.Functional deficits and left hemiparesissecondary to right MCA distribution embolic infarcts CIR evals PT, OT, SLP 2. Antithrombotics: -DVT/anticoagulation:Pharmaceutical:Lovenox -antiplatelet therapy: ASA/Plavix 3. Pain Management:Tylenol prn 4. Mood:LCSW to follow for evaluation and support. -antipsychotic agents: N/A 5. Neuropsych: This patientiscapable of making decisions on herown behalf. 6. Skin/Wound Care:Routine pressure relief measuresRIght Upper lip   laceration sutures out next week  7. Fluids/Electrolytes/Nutrition:Monitor I/O. -I personally reviewed the patient's labs today. -replace potassium (3.0) 8. T2DM: New diagnosis with Hgb A1c-9.3. Educate on Carb modified diet. Change Lantus/novolog to low dose metformin titrate upwardsas indicated. Will continue to use sliding scale insulin for elevated blood sugars. CBG (last 3)  Recent Labs    06/11/19 1703 06/11/19 2100 06/12/19 0612  GLUCAP 210* 143* 161*  Elevated cont metformin and SSI  9. Bilateral carotid artery stenosis:Follow-up with vascular surgery after discharge 10. New onsetsimple partialseizures: Continue Keppra 1000 mg bid. 11. Morbid obesity: BMI31.0--educated patient on importance of weight loss to help with overall health and mobility. 12. HTN: Monitor BP tid--avoid hypotension.  Vitals:   06/11/19 2009 06/12/19 0335  BP: (!) 165/80 (!) 160/66  Pulse: 85 80  Resp: 16 18  Temp: 98.2 F (36.8 C) 98.5 F (36.9 C)  SpO2: 100% 98%  occ above goal started Zestril yesterday monitor  13. Tobacco use: Continue to educate on importance of cessation.  14. Dyslipidemia:Continue statin #15 hypokalemia KCl  LOS: 3 days A FACE TO FACE EVALUATION WAS PERFORMED  Charlett Blake 06/12/2019, 9:05 AM

## 2019-06-12 NOTE — Progress Notes (Signed)
Occupational Therapy Session Note  Patient Details  Name: Tammy Nunez MRN: [id number] Date of Birth: Feb 27, 1951  Today's Date: 06/12/2019 OT Individual Time: 7867-6720 OT Individual Time Calculation (min): 41 min   Short Term Goals: Week 1:  OT Short Term Goal 1 (Week 1): Pt will be able to sit to stand with min A during LB dressing. OT Short Term Goal 2 (Week 1): Pt will be able to transfer into shower with min A. OT Short Term Goal 3 (Week 1): Pt will complete toilet transfer with min A. OT Short Term Goal 4 (Week 1): Pt will be able to don shirt with min A. OT Short Term Goal 5 (Week 1): Pt will be able to don pants with mod A.    Skilled Therapeutic Interventions/Progress Updates:    Pt greeted in bed with no c/o pain. Just finished having perihygiene completed with NT present. Wanting to brush her teeth at the sink. She transitioned into sitting from flat bed with Max A and vcs for hand placement. Once EOB, worked on unsupported sitting balance and midline orientation while OT donned her sneakers. Pt with Lt LOB and needed assist to correct. She was able to maintain neutral alignment with hands in lap or with Lt hand on baseboard of bed. Mod A for squat pivot<w/c towards Rt side. Sit<stand with Mod A at sink to complete oral care. She required verbal and auditory cues for scanning fully to Rt to find needed items. Facilitation provided for R UE weightbearing or incorporation as gross stabilizer. Verbal and manual cues for keeping Lt knee extended and trunk upright vs leaning towards Lt. Used Geologist, engineering for Warden/ranger. She stood for 6 minutes with Mod A for standing balance. While sitting, she doffed shirt and donned a clean one. Pt required Mod A using hemi dressing techniques. OT lifted and stabilized L LE in figure 4 position for threading pants and donning sneaker. Pt able to utilize figure 4 for R LE unassisted during dressing tasks. The same assist required for sit<stand and standing  balance to pull up pants. Vcs for full elevation on Lt side. While seated, she completed handwashing with vcs for including her affected UE. After she was setup with half lap tray and safety belt, OT taught her self ROM techniques for elbow flexion/extension, forearm supination/pronation, and wrist flexion/extension. She needed max demonstrational cues to complete correctly, and would benefit from continued practice. Pt left with all needs within reach at end of tx. Tx focus placed on Lt NMR, standing balance, and Lt attention.   Therapy Documentation Precautions:  Precautions Precautions: Fall, Other (comment) Precaution Comments: L lateral lean and L knee buckles Restrictions Weight Bearing Restrictions: No Vital Signs:  ADL: ADL Eating: Set up Grooming: Setup Upper Body Bathing: Minimal assistance Where Assessed-Upper Body Bathing: Shower Lower Body Bathing: Moderate assistance Where Assessed-Lower Body Bathing: Shower Upper Body Dressing: Moderate assistance Where Assessed-Upper Body Dressing: Wheelchair Lower Body Dressing: Maximal assistance Where Assessed-Lower Body Dressing: Wheelchair Toileting: Maximal assistance Where Assessed-Toileting: Glass blower/designer: Moderate assistance Toilet Transfer Method: Squat pivot Toilet Transfer Equipment: Energy manager: Moderate assistance Social research officer, government Method: Education officer, environmental: Shower seat with back, Grab bars      Therapy/Group: Individual Therapy   A  06/12/2019, 12:37 PM

## 2019-06-13 ENCOUNTER — Inpatient Hospital Stay (HOSPITAL_COMMUNITY): Payer: PPO

## 2019-06-13 ENCOUNTER — Encounter (INDEPENDENT_AMBULATORY_CARE_PROVIDER_SITE_OTHER): Payer: Self-pay

## 2019-06-13 ENCOUNTER — Encounter (HOSPITAL_COMMUNITY): Payer: PPO | Admitting: Psychology

## 2019-06-13 ENCOUNTER — Inpatient Hospital Stay (HOSPITAL_COMMUNITY): Payer: PPO | Admitting: Occupational Therapy

## 2019-06-13 LAB — CBC
HCT: 50.3 % — ABNORMAL HIGH (ref 36.0–46.0)
Hemoglobin: 17.1 g/dL — ABNORMAL HIGH (ref 12.0–15.0)
MCH: 30.9 pg (ref 26.0–34.0)
MCHC: 34 g/dL (ref 30.0–36.0)
MCV: 90.8 fL (ref 80.0–100.0)
Platelets: 163 10*3/uL (ref 150–400)
RBC: 5.54 MIL/uL — ABNORMAL HIGH (ref 3.87–5.11)
RDW: 12.9 % (ref 11.5–15.5)
WBC: 6.3 10*3/uL (ref 4.0–10.5)
nRBC: 0 % (ref 0.0–0.2)

## 2019-06-13 LAB — BASIC METABOLIC PANEL
Anion gap: 9 (ref 5–15)
BUN: 16 mg/dL (ref 8–23)
CO2: 20 mmol/L — ABNORMAL LOW (ref 22–32)
Calcium: 9.6 mg/dL (ref 8.9–10.3)
Chloride: 111 mmol/L (ref 98–111)
Creatinine, Ser: 1.06 mg/dL — ABNORMAL HIGH (ref 0.44–1.00)
GFR calc Af Amer: >60 mL/min (ref >60)
GFR calc non Af Amer: 54 mL/min — ABNORMAL LOW (ref >60)
Glucose, Bld: 157 mg/dL — ABNORMAL HIGH (ref 70–99)
Potassium: 4.5 mmol/L (ref 3.5–5.1)
Sodium: 140 mmol/L (ref 135–145)

## 2019-06-13 LAB — GLUCOSE, CAPILLARY
Glucose-Capillary: 149 mg/dL — ABNORMAL HIGH (ref 70–99)
Glucose-Capillary: 141 mg/dL — ABNORMAL HIGH (ref 70–99)
Glucose-Capillary: 150 mg/dL — ABNORMAL HIGH (ref 70–99)
Glucose-Capillary: 135 mg/dL — ABNORMAL HIGH (ref 70–99)

## 2019-06-13 NOTE — Progress Notes (Signed)
Occupational Therapy Session Note  Patient Details  Name: Tammy Nunez MRN: [id number] Date of Birth: February 18, 1951  Today's Date: 06/13/2019 OT Individual Time: 1660-6301 OT Individual Time Calculation (min): 56 min    Short Term Goals: Week 1:  OT Short Term Goal 1 (Week 1): Pt will be able to sit to stand with min A during LB dressing. OT Short Term Goal 2 (Week 1): Pt will be able to transfer into shower with min A. OT Short Term Goal 3 (Week 1): Pt will complete toilet transfer with min A. OT Short Term Goal 4 (Week 1): Pt will be able to don shirt with min A. OT Short Term Goal 5 (Week 1): Pt will be able to don pants with mod A.  Skilled Therapeutic Interventions/Progress Updates:    Pt found in bed soaked with soiled brief and linens with diarrhea and some urine.  Max assist to clean up before transferring to the EOB with mod assist.  Stedy utilized secondary to time and pt being soiled for transfer to the walk-in shower.  Pt able to completed sit to stand on the [place] with min assist.  Mod instructional cueing for sequencing bathing once sitting on the shower bench.  Increased neglect of the left side noted unless pt cued to wash or dry the left arm.  Mod assist for sit to stand in the shower for therapist to assist with washing buttocks.  Max hand over hand assist for integration of the LUE for washing the right arm.  Pt transferred out of the shower with use of the Stedy to the wheelchair for dressing.  Max assist for UB dressing to sequence secondary to apraxia as well as max assist for LB dressing sit to stand.  Will attempt tomorrow in lower wheelchair, which I feel may make it easier.  Finished dressing and pt was left sitting in the wheelchair with alarm belt in place and call button and phone in reach. Nursing present as well.   Therapy Documentation Precautions:  Precautions Precautions: Fall, Other (comment) Precaution Comments: left hemiparesis Restrictions Weight Bearing  Restrictions: No  Pain: Pain Assessment Pain Scale: Faces Pain Score: 0-No pain ADL: See Care Tool Section for some details of ADL  Therapy/Group: Individual Therapy  , OTR/L 06/13/2019, 10:51 AM

## 2019-06-13 NOTE — Progress Notes (Signed)
Physical Therapy Session Note  Patient Details  Name: Tammy Nunez MRN: [id number] Date of Birth: 1951/04/02  Today's Date: 06/13/2019 PT Individual Time: 1415-1526 PT Individual Time Calculation (min): 71 min   Short Term Goals: Week 1:  PT Short Term Goal 1 (Week 1): Pt will perform supine<>sit with mod assist PT Short Term Goal 2 (Week 1): Pt will perform sit<>stand with min assist PT Short Term Goal 3 (Week 1): Pt will perform bed<>chair transfers with min assist PT Short Term Goal 4 (Week 1): Pt will ambulate 67ft using LRAD with max assist  Skilled Therapeutic Interventions/Progress Updates:    Pt seated in w/c upon PT arrival, agreeable to therapy tx and denies pain. Pt transported to the gym and performed stand pivot to the mat with min assist, cues for techniques. Pt worked on dynamic standing balance while performing horseshoe toss activity x 2 trials with min assist and max cues for L quad activation, mirror for visual feedback of L quad activation and visual feedback/verbal cues for returning to midline following each reach. Pt worked on L stance control/L quad activation while working on pre-gait stepping in place with R LE x 10 steps without AD and with min-mod assist, mirror for visual feedback and tactile cues for quad activation. Pt performed seated L LE LAQ with 3 sec hold at end range for neuro re-ed. Pt worked on L LE stance control while performing stepping in place with R LE this time with RW for UE support, x 10 steps with R LE, verbal/tactile cues for L quad activation during stance and mirror for visual feedback. Pt ambulated x 10 ft with RW and mod assist +2 for w/c follow for safety, therapist providing L knee block and cues for quad activation during stance, therapist assisting with L foot placement during swing, use of ACE wrap for DF assist. Pt transported to dayroom and performed stand pivot w/c<>nustep with mod assist, used nustep x 6 minutes using B UEs (L hand  attachement) and B LEs for neuro re-ed and strengthening. Pt ambulated x 10 ft with max assist and RW, increased difficulty controlling RW and increased L lateral lean limiting ability to ambulate with less assist. Pt transported back to room and left in w/c with needs in reach and chair alarm set.   Therapy Documentation Precautions:  Precautions Precautions: Fall, Other (comment) Precaution Comments: L lateral lean and L knee buckles Restrictions Weight Bearing Restrictions: No    Therapy/Group: Individual Therapy  Netta Corrigan, PT, DPT 06/13/2019, 7:59 AM

## 2019-06-13 NOTE — Consult Note (Signed)
Neuropsychological Consultation   Patient:   Tammy Nunez   DOB:   Sep 21, 1951  MR Number:  [id number]  Location:  [place] [facility] [place] [id number] [place] [address] Dept: [place]: [phone]           Date of Service:   06/13/2019  Start Time:   9 AM End Time:   10 AM  Provider/Observer:  Ilean Skill, Psy.D.       Clinical Neuropsychologist       Billing Code/Service: [phone]  Chief Complaint:    Tammy Nunez is a 68 year old female that was in relatively good health.  Patient was admitted to [facility] on 7/413/2020 with left-sided numbness and weakness that started early in day.  BP was elevated and blood glucose was 285.  MRI showed embolic infarcts in right MCA territory impacting PTO junction and right frontoparietal vertex.  CTA showed severe atherosclerotic disease at both carotid bifurcation and ICA bulbs.  Vascular waiting 2-3 weeks for follow-up for right carotid artery stenting following in 2 weeks for left carotid artery stenting.  CIR recommended due to functional deficits.    Reason for Service:  Patient was referred for neuropsychological consultation due to coping and adjustment issues.  Below is the HPI for the admission.    PJA:SNKNLZ Tammy Nunez is a 68 year old femalerelatively good health(but no medical care for years)who was admitted to [facility] on 06/06/2019 evening with left-sided numbness and weakness that started early a.m.patient's blood pressure was significantly elevated at 214/110 and herblood glucose was elevated at 285. CTA head neck showed severe atherosclerotic disease at both carotid bifurcation and ICA bulbs with >80% stenosis on the right and>70% stenosis on the left. MRI brain showed embolic infarcts in right MCA territory affecting temporal, parietal occipital junction and right frontoparietal vertex with minimal petechial blood products in right  posterior temporal parietaloccipital junction without frank hematoma.2D echo done revealing EF greater than 65% with severely increased left ventricular wall thickness.Dr. Clarita Leber that stroke was due to small vessel disease and recommended DAPTand vascularsurgery was consulted for input.  Vascular recommended waiting 2 to 3 weeks and follow-up with Dr. Payton Mccallum for right carotid artery stenting in 2 weeks followed by left carotid artery stenting 6 weeks thereafter. At admission,she was noted to have left facial and LUE twitchingfelt to be due tosimplepartial motor seizures therefore loaded with Keppra and started on 500mg  twice daily. She continued to have ongoing symptoms therefore Keppra increased to 1000 mg bid.Downgraded to dysphagia 3 chopped meat due to dental issues. Patient with right gaze preference with left neglect,decreased awareness of deficits, high-level cognitive deficits left-sided weakness withpusher tendencies.CIR recommended due to functional deficits   Current Status:  Patient reports that she is doing better and awareness and coping improving.  Stressed about medical (stenting) in future.  Denied significant depressive or anxiety symptoms.  Cognitive issues related to PTO junction and reduced information processing speed.  Awareness improving.    Behavioral Observation: Tammy Nunez  presents as a 68 y.o.-year-old Right African American Female who appeared her stated age. her dress was Appropriate and she was Well Groomed and her manners were Appropriate to the situation.  her participation was indicative of Appropriate and Redirectable behaviors.  There were any physical disabilities noted.  she displayed an appropriate level of cooperation and motivation.     Interactions:    Active Appropriate and Redirectable  Attention:   abnormal and attention  span appeared shorter than expected for age  Memory:   abnormal; remote memory intact, recent memory  impaired  Visuo-spatial:  not examined  Speech (Volume):  low  Speech:   normal; normal  Thought Process:  Coherent and Relevant  Though Content:  WNL; not suicidal and not homicidal  Orientation:   person, place, time/date and situation  Judgment:   Fair  Planning:   Fair  Affect:    Appropriate  Mood:    Dysphoric  Insight:   Fair  Intelligence:   normal   Medical History:   Past Medical History:  Diagnosis Date  . Chronic left shoulder pain      Psychiatric History:  Patient denies past psychiatric history.  Family Med/Psych History:  Family History  Problem Relation Age of Onset  . Diabetes type II Mother   . Hypertension Father   . Diabetes type II Brother    Impression/DX:  Tammy Nunez is a 68 year old female that was in relatively good health.  Patient was admitted to [facility] on 7/413/2020 with left-sided numbness and weakness that started early in day.  BP was elevated and blood glucose was 285.  MRI showed embolic infarcts in right MCA territory impacting PTO junction and right frontoparietal vertex.  CTA showed severe atherosclerotic disease at both carotid bifurcation and ICA bulbs.  Vascular waiting 2-3 weeks for follow-up for right carotid artery stenting following in 2 weeks for left carotid artery stenting.  CIR recommended due to functional deficits.    Patient reports that she is doing better and awareness and coping improving.  Stressed about medical (stenting) in future.  Denied significant depressive or anxiety symptoms.  Cognitive issues related to PTO junction and reduced information processing speed.  Awareness improving.   Disposition/Plan:  Worked on coping and adjustment issues.  Patient with increasing awareness and clarity of thinking.  Mood good.    Diagnosis:           Electronically Signed   _______________________ Ilean Skill, Psy.D.

## 2019-06-13 NOTE — Progress Notes (Signed)
Speech Language Pathology Daily Session Note  Patient Details  Name: Tammy Nunez MRN: [id number] Date of Birth: 1951/05/23  Today's Date: 06/13/2019 SLP Individual Time: 1100-1200 SLP Individual Time Calculation (min): 60 min  Short Term Goals: Week 1: SLP Short Term Goal 1 (Week 1): Patient will demonstrate efficient mastication with complete oral clearance with trials of regular textures of 2 sessions prior to upgrade. SLP Short Term Goal 2 (Week 1): Patient will demonstrate complex problem solving for familiar tasks with supervision verbal cues. SLP Short Term Goal 3 (Week 1): Patient will self-monitor and correct errors during functional tasks with supervision verbal cues. SLP Short Term Goal 4 (Week 1): Patient will scan to left field of enviornment during functional tasks with Mod A verbal cues.  Skilled Therapeutic Interventions: Skilled ST services focused on cognitive skills. SLP facilitated familiar functional problem solving task utilizing basic money management (money was placed on right side only), pt required max A verbal cues for counting change and when broken down step by step mod A verbal cues. Pt demonstrated ability to display requested amount for simple change (eg. 0.35, 0.65), however with large amounts (eg. 2.13, 5.69) pt required max A verbal cues. Pt demonstrated ability to add simple change with min A verbal cues, however subtracting simple change with paper and pencil required max A verbal cues. Pt demonstrated ability to recall requested amount in money task with mod A verbal cues, however working memory was severely impaired. SLP also facilitated basic problem solving and error awareness skills in simple PEG design task, pt required total A for planning, max A verbal cues for problem solving, error awareness and scanning left of midline. Pt was left in room with call bell within reach and chair alarm set. ST recommends to continue skilled ST services.      Pain Pain  Assessment Pain Score: 0-No pain  Therapy/Group: Individual Therapy     06/13/2019, 2:00 PM

## 2019-06-13 NOTE — Progress Notes (Signed)
PHYSICAL MEDICINE & REHABILITATION PROGRESS NOTE   Subjective/Complaints: No issues overnite except diarrhea , Glucophage increased   ROS- neg CP SOB N/V/D  Objective:   No results found. Recent Labs    06/13/19 0729  WBC 6.3  HGB 17.1*  HCT 50.3*  PLT 163   Recent Labs    06/13/19 0729  NA 140  K 4.5  CL 111  CO2 20*  GLUCOSE 157*  BUN 16  CREATININE 1.06*  CALCIUM 9.6    Intake/Output Summary (Last 24 hours) at 06/13/2019 0855 Last data filed at 06/12/2019 1330 Gross per 24 hour  Intake 60 ml  Output -  Net 60 ml     Physical Exam: Vital Signs Blood pressure (!) 174/68, pulse 88, temperature 98.6 F (37 C), resp. rate 18, height 5\' 2"  (1.575 m), weight 77.6 kg, SpO2 97 %.  General: No acute distress HEENT R upper lip sutures intact well approximated vermillion border with mild swelling  Mood and affect are appropriate Heart: Regular rate and rhythm no rubs murmurs or extra sounds Lungs: Clear to auscultation, breathing unlabored, no rales or wheezes Abdomen: Positive bowel sounds, soft nontender to palpation, nondistended Extremities: No clubbing, cyanosis, or edema Skin: No evidence of breakdown, no evidence of rash Neurologic: Cranial nerves II through XII intact, motor strength is 2-/5 in left , bicep, tricep,2- finger flexors , 2- hip flexor, knee extensors, ankle dorsiflexor and plantar flexor  Musculoskeletal: Full range of motion in all 4 extremities. No joint swelling    Assessment/Plan: 1. Functional deficits secondary to Left Hemi R MCA which require 3+ hours per day of interdisciplinary therapy in a comprehensive inpatient rehab setting.  Physiatrist is providing close team supervision and 24 hour management of active medical problems listed below.  Physiatrist and rehab team continue to assess barriers to discharge/monitor patient progress toward functional and medical goals  Care Tool:  Bathing    Body parts bathed by  patient: Chest, Abdomen, Left arm, Front perineal area, Right upper leg, Left upper leg, Face, Right lower leg, Left lower leg   Body parts bathed by helper: Right arm, Buttocks     Bathing assist Assist Level: Maximal Assistance - Patient 24 - 49%     Upper Body Dressing/Undressing Upper body dressing Upper body dressing/undressing activity did not occur (including orthotics): Safety/medical concerns What is the patient wearing?: Pull over shirt    Upper body assist Assist Level: Moderate Assistance - Patient 50 - 74%    Lower Body Dressing/Undressing Lower body dressing      What is the patient wearing?: Pants     Lower body assist Assist for lower body dressing: Moderate Assistance - Patient 50 - 74%     Toileting Toileting    Toileting assist Assist for toileting: Maximal Assistance - Patient 25 - 49%     Transfers Chair/bed transfer  Transfers assist     Chair/bed transfer assist level: Moderate Assistance - Patient 50 - 74%     Locomotion Ambulation   Ambulation assist      Assist level: 2 helpers Assistive device: Other (comment)(hallway rail) Max distance: 60ft   Walk 10 feet activity   Assist     Assist level: 2 helpers Assistive device: Other (comment)(hallway rail)   Walk 50 feet activity   Assist Walk 50 feet with 2 turns activity did not occur: Safety/medical concerns         Walk 150 feet activity   Assist Walk 150 feet  activity did not occur: Safety/medical concerns         Walk 10 feet on uneven surface  activity   Assist Walk 10 feet on uneven surfaces activity did not occur: Safety/medical concerns         Wheelchair     Assist Will patient use wheelchair at discharge?: (TBD) Type of Wheelchair: Manual    Wheelchair assist level: Minimal Assistance - Patient > 75% Max wheelchair distance: 150 ft    Wheelchair 50 feet with 2 turns activity    Assist        Assist Level: Minimal Assistance -  Patient > 75%   Wheelchair 150 feet activity     Assist     Assist Level: Minimal Assistance - Patient > 75%    Medical Problem List and Plan: 1.Functional deficits and left hemiparesissecondary to right MCA distribution embolic infarcts CIR evals PT, OT, SLP 2. Antithrombotics: -DVT/anticoagulation:Pharmaceutical:Lovenox -antiplatelet therapy: ASA/Plavix 3. Pain Management:Tylenol prn 4. Mood:LCSW to follow for evaluation and support. -antipsychotic agents: N/A 5. Neuropsych: This patientiscapable of making decisions on herown behalf. 6. Skin/Wound Care:Routine pressure relief measuresRIght Upper lip   laceration sutures out next week  7. Fluids/Electrolytes/Nutrition:Monitor I/O. -I personally reviewed the patient's labs today. -replace potassium (3.0) 8. T2DM: New diagnosis with Hgb A1c-9.3. Educate on Carb modified diet. Change Lantus/novolog to low dose metformin titrate upwardsas indicated. Will continue to use sliding scale insulin for elevated blood sugars. CBG (last 3)  Recent Labs    06/12/19 1720 06/12/19 2115 06/13/19 0619  GLUCAP 131* 139* 149*  controlled  cont metformin and SSI  9. Bilateral carotid artery stenosis:Follow-up with vascular surgery after discharge 10. New onsetsimple partialseizures: Continue Keppra 1000 mg bid. 11. Morbid obesity: BMI31.0--educated patient on importance of weight loss to help with overall health and mobility. 12. HTN: Monitor BP tid--avoid hypotension.  Vitals:   06/13/19 0540 06/13/19 0852  BP: (!) 177/66 (!) 174/68  Pulse: 88   Resp: 18   Temp: 98.6 F (37 C)   SpO2: 97%   Will increase meds end of week if it remains elevated  13. Tobacco use: Continue to educate on importance of cessation.  14. Dyslipidemia:Continue statin #15 hypokalemia resolved on oral KCl cont KCL 49meq BID 16.  Loose stools if this does not resolve may need  to reduce metformin dose  LOS: 4 days A FACE TO FACE EVALUATION WAS PERFORMED  Charlett Blake 06/13/2019, 8:55 AM

## 2019-06-14 ENCOUNTER — Encounter (INDEPENDENT_AMBULATORY_CARE_PROVIDER_SITE_OTHER): Payer: Self-pay

## 2019-06-14 ENCOUNTER — Inpatient Hospital Stay (HOSPITAL_COMMUNITY): Payer: PPO

## 2019-06-14 ENCOUNTER — Inpatient Hospital Stay (HOSPITAL_COMMUNITY): Payer: PPO | Admitting: Occupational Therapy

## 2019-06-14 LAB — GLUCOSE, CAPILLARY
Glucose-Capillary: 142 mg/dL — ABNORMAL HIGH (ref 70–99)
Glucose-Capillary: 150 mg/dL — ABNORMAL HIGH (ref 70–99)
Glucose-Capillary: 120 mg/dL — ABNORMAL HIGH (ref 70–99)
Glucose-Capillary: 119 mg/dL — ABNORMAL HIGH (ref 70–99)

## 2019-06-14 MED ORDER — METFORMIN HCL 500 MG PO TABS
500.0000 mg | ORAL_TABLET | Freq: Two times a day (BID) | ORAL | Status: DC
  Administered 2019-06-14 – 2019-06-16 (×4): 500 mg via ORAL
  Filled 2019-06-14 (×4): qty 1

## 2019-06-14 NOTE — Progress Notes (Signed)
Speech Language Pathology Daily Session Note  Patient Details  Name: Tammy Nunez MRN: [id number] Date of Birth: 1951/03/05  Today's Date: 06/14/2019 SLP Individual Time: 1430-1500 SLP Individual Time Calculation (min): 30 min  Short Term Goals: Week 1: SLP Short Term Goal 1 (Week 1): Patient will demonstrate efficient mastication with complete oral clearance with trials of regular textures of 2 sessions prior to upgrade. SLP Short Term Goal 2 (Week 1): Patient will demonstrate complex problem solving for familiar tasks with supervision verbal cues. SLP Short Term Goal 3 (Week 1): Patient will self-monitor and correct errors during functional tasks with supervision verbal cues. SLP Short Term Goal 4 (Week 1): Patient will scan to left field of enviornment during functional tasks with Mod A verbal cues.  Skilled Therapeutic Interventions: Skilled ST services focused on cognitive skills. SLP facilitated semi-complex verbal problem solving skills (due to pervious impact in visual problem solving from visual-perceptive deficits) utilizing ALFA, pt demonstrated accuracy in 1 out 6 opportunities and given  Max A verbal cues to break down the problem step by step 3 pout 6 opportunities. SLP provided +2 reading glasses, pt read verbal problem solving questions to reduce impact from likely memory deficits, pt required mod A verbal cues to decode sentences and mod A verbal cues to scan left of midline. SLP also facilitated basic problem solving skills with 3 step sequence picture cards, pt required mod A verbal cues and min A verbal cues to scan left. Pt was left in room with call bell within reach and chair alarm set. ST recommends to continue skilled ST services.      Pain Pain Assessment Pain Scale: Faces Pain Score: 0-No pain  Therapy/Group: Individual Therapy  Trichelle Lehan  [facility] 06/14/2019, 3:20 PM

## 2019-06-14 NOTE — Progress Notes (Signed)
Physical Therapy Session Note  Patient Details  Name: Tammy Nunez MRN: [id number] Date of Birth: 1951/09/25  Today's Date: 06/14/2019 PT Individual Time: 1102-1200 PT Individual Time Calculation (min): 58 min   Short Term Goals: Week 1:  PT Short Term Goal 1 (Week 1): Pt will perform supine<>sit with mod assist PT Short Term Goal 2 (Week 1): Pt will perform sit<>stand with min assist PT Short Term Goal 3 (Week 1): Pt will perform bed<>chair transfers with min assist PT Short Term Goal 4 (Week 1): Pt will ambulate 48ft using LRAD with max assist  Skilled Therapeutic Interventions/Progress Updates:    Pt seated in w/c upon PT arrival, agreeable to therapy tx and denies pain. Pt transported to the gym in w/c, stand pivot from w/c<>mat with min assist and cues for techniques. Pt worked on L knee control and neuro re-ed to perform 2 x 10 mini squats with emphasis on symmetric LE weightbearing, mirror for visual feedback and cues for techniques. Pt worked on L stance control/L quad activation while working on pre-gait stepping in place with R LE 2 x 10 steps without AD and with min-mod assist, mirror for visual feedback and tactile cues for quad activation. Pt worked on L LE swing phase by stepping in place with L LE without AD x 10 steps with cues for foot placement to prevent hip adduction, cues for balance to maintain R lateral weightshift when moving L LE, mirror for visual feedback, min-mod assist for balance. Pt transferred to supine on the mat with min assist. In supine pt performed 2 x 10 bridges for hip strength and neuro re-ed. Pt transferred to sidelying with min assist, in sidelying pt performed L LE hip abduction clamshells for neuro re-ed 2 x 10. Pt transferred to prone with min assist, prone on elbows for hip flexor stretching. While in prone pt performed 2 x 10 active assisted hamstring curls with L LE for neuro re-ed. Pt rolled back to supine min assist and performed 2 x 10 heels  slides, 2 x 10 straight leg raises with L LE for neuro re-ed and strengthening. Supine>sitting with mod assist and transferred to w/c with min assist stand pivot. Pt left in w/c with needs in reach and chair alarm set.   Therapy Documentation Precautions:  Precautions Precautions: Fall, Other (comment) Precaution Comments: left hemiparesis Restrictions Weight Bearing Restrictions: No   Therapy/Group: Individual Therapy  Netta Corrigan, PT, DPT 06/14/2019, 7:56 AM

## 2019-06-14 NOTE — Telephone Encounter (Signed)
Patient is still in the hospital. She has been scheduled for her carotid stents as follows: right carotid stent is on 06/29/2019 with a 6:45 am arrival time at the [facility] and Covid testing at [place] on 06/24/2019 between 12:30-2:30 pm. Left carotid stent on 07/20/2019 with a 6:45 am arrival time to the [facility] and Covid testing at [place] on 07/15/2019 between 12:30-2:30 pm. The pre-procedure instructions will be mailed to the patients home.

## 2019-06-14 NOTE — Progress Notes (Signed)
Occupational Therapy Session Note  Patient Details  Name: Tammy Nunez MRN: [id number] Date of Birth: 08/05/1951  Today's Date: 06/14/2019 OT Individual Time: 1300-1411 OT Individual Time Calculation (min): 71 min    Short Term Goals: Week 1:  OT Short Term Goal 1 (Week 1): Pt will be able to sit to stand with min A during LB dressing. OT Short Term Goal 2 (Week 1): Pt will be able to transfer into shower with min A. OT Short Term Goal 3 (Week 1): Pt will complete toilet transfer with min A. OT Short Term Goal 4 (Week 1): Pt will be able to don shirt with min A. OT Short Term Goal 5 (Week 1): Pt will be able to don pants with mod A.  Skilled Therapeutic Interventions/Progress Updates:    Pt completed squat pivot transfer to the therapy mat from the wheelchair with mod assist.  She was then able to work on LUE neuromuscular re-education during session.  Had pt begin with LUE in weightbearing and working on sustained activation in the elbow while reaching across her body with the RUE to pick up and place bean bags.  Progressed to working on activation using tilted stool as well.  Pt able to push stool forward to target but demonstrates difficulty sustaining activation and keeping the stool up against the target.  Next therapist applied NMES to the dorsal left forearm for activation of the digit extensors.  While stimuli was active she was instructed to try and open her fingers while reaching out to a target as well.  When it was relaxed she was instructed to try and close her hand.  Noted activation of digit flexors actively, but with increased compensation and tone noted in the biceps when she tries to close her hand.  She tolerated 15 mins of active stimuli with custom program and intensity at level 22.  On time 15 seconds off time 8 seconds with PPS at 35 and pulse width at 300.  No adverse reactions to stimuli noted.  Finished session with return to the wheelchair squat pivot to the right with  min assist and therapist assist to push back to the room.  Call button and phone in reach with alarm belt in place.    Therapy Documentation Precautions:  Precautions Precautions: Fall Precaution Comments: left hemiparesis Restrictions Weight Bearing Restrictions: No  Pain: Pain Assessment Pain Scale: Faces Pain Score: 0-No pain   Therapy/Group: Individual Therapy  , OTR/L 06/14/2019, 3:39 PM

## 2019-06-14 NOTE — Progress Notes (Signed)
[facility] [facility] PHYSICAL MEDICINE & REHABILITATION PROGRESS NOTE   Subjective/Complaints: No issues overnite except diarrhea ,  ROS- neg CP SOB N/V/D  Objective:   No results found. Recent Labs    06/13/19 0729  WBC 6.3  HGB 17.1*  HCT 50.3*  PLT 163   Recent Labs    06/13/19 0729  NA 140  K 4.5  CL 111  CO2 20*  GLUCOSE 157*  BUN 16  CREATININE 1.06*  CALCIUM 9.6    Intake/Output Summary (Last 24 hours) at 06/14/2019 6629 Last data filed at 06/13/2019 1816 Gross per 24 hour  Intake 240 ml  Output -  Net 240 ml     Physical Exam: Vital Signs Blood pressure (!) 182/69, pulse 88, temperature 98.1 F (36.7 C), resp. rate 17, height 5\' 2"  (1.575 m), weight 77.6 kg, SpO2 100 %.  General: No acute distress HEENT R upper lip sutures intact well approximated vermillion border with mild swelling  Mood and affect are appropriate Heart: Regular rate and rhythm no rubs murmurs or extra sounds Lungs: Clear to auscultation, breathing unlabored, no rales or wheezes Abdomen: Positive bowel sounds, soft nontender to palpation, nondistended Extremities: No clubbing, cyanosis, or edema Skin: No evidence of breakdown, no evidence of rash Neurologic: Cranial nerves II through XII intact, motor strength is 2-/5 in left , bicep, tricep,2- finger flexors , 2- hip flexor, knee extensors, ankle dorsiflexor and plantar flexor  Musculoskeletal: Full range of motion in all 4 extremities. No joint swelling    Assessment/Plan: 1. Functional deficits secondary to Left Hemi R MCA which require 3+ hours per day of interdisciplinary therapy in a comprehensive inpatient rehab setting.  Physiatrist is providing close team supervision and 24 hour management of active medical problems listed below.  Physiatrist and rehab team continue to assess barriers to discharge/monitor patient progress toward functional and medical goals  Care Tool:  Bathing    Body parts bathed by patient: Left arm,  Chest, Abdomen, Front perineal area, Right upper leg, Left upper leg, Right lower leg, Face   Body parts bathed by helper: Left lower leg, Buttocks, Right arm     Bathing assist Assist Level: Moderate Assistance - Patient 50 - 74%     Upper Body Dressing/Undressing Upper body dressing Upper body dressing/undressing activity did not occur (including orthotics): Safety/medical concerns What is the patient wearing?: Pull over shirt    Upper body assist Assist Level: Maximal Assistance - Patient 25 - 49%    Lower Body Dressing/Undressing Lower body dressing      What is the patient wearing?: Pants, Incontinence brief     Lower body assist Assist for lower body dressing: Total Assistance - Patient < 25%     Toileting Toileting    Toileting assist Assist for toileting: Maximal Assistance - Patient 25 - 49%     Transfers Chair/bed transfer  Transfers assist     Chair/bed transfer assist level: Minimal Assistance - Patient > 75%     Locomotion Ambulation   Ambulation assist      Assist level: 2 helpers Assistive device: Walker-rolling Max distance: 10 ft   Walk 10 feet activity   Assist     Assist level: 2 helpers Assistive device: Walker-rolling   Walk 50 feet activity   Assist Walk 50 feet with 2 turns activity did not occur: Safety/medical concerns         Walk 150 feet activity   Assist Walk 150 feet activity did not occur: Safety/medical concerns  Walk 10 feet on uneven surface  activity   Assist Walk 10 feet on uneven surfaces activity did not occur: Safety/medical concerns         Wheelchair     Assist Will patient use wheelchair at discharge?: (TBD) Type of Wheelchair: Manual    Wheelchair assist level: Minimal Assistance - Patient > 75% Max wheelchair distance: 150 ft    Wheelchair 50 feet with 2 turns activity    Assist        Assist Level: Minimal Assistance - Patient > 75%   Wheelchair 150 feet  activity     Assist     Assist Level: Minimal Assistance - Patient > 75%    Medical Problem List and Plan: 1.Functional deficits and left hemiparesissecondary to right MCA distribution embolic infarcts CIR evals PT, OT, SLP, team conf in am  2. Antithrombotics: -DVT/anticoagulation:Pharmaceutical:Lovenox -antiplatelet therapy: ASA/Plavix 3. Pain Management:Tylenol prn 4. Mood:LCSW to follow for evaluation and support. -antipsychotic agents: N/A 5. Neuropsych: This patientiscapable of making decisions on herown behalf. 6. Skin/Wound Care:Routine pressure relief measuresRIght Upper lip   laceration sutures out next week  7. Fluids/Electrolytes/Nutrition:Monitor I/O. -I personally reviewed the patient's labs today. -replace potassium (3.0) 8. T2DM: New diagnosis with Hgb A1c-9.3. Educate on Carb modified diet. Change Lantus/novolog to low dose metformin titrate upwardsas indicated. Will continue to use sliding scale insulin for elevated blood sugars. CBG (last 3)  Recent Labs    06/13/19 1647 06/13/19 2153 06/14/19 0618  GLUCAP 150* 135* 142*  controlled  cont metformin and SSI but developed diarrhea since dose increased to 850mg  BID, will reduce to 500mg  and monitor  9. Bilateral carotid artery stenosis:Follow-up with vascular surgery after discharge 10. New onsetsimple partialseizures: Continue Keppra 1000 mg bid. 11. Morbid obesity: BMI31.0--educated patient on importance of weight loss to help with overall health and mobility. 12. HTN: Monitor BP tid--avoid hypotension.  Vitals:   06/13/19 1936 06/14/19 0501  BP: (!) 164/67 (!) 182/69  Pulse: 89 88  Resp: 17 17  Temp: 97.9 F (36.6 C) 98.1 F (36.7 C)  SpO2: 100% 100%  Will increase meds end of week if it remains elevated  13. Tobacco use: Continue to educate on importance of cessation.  14. Dyslipidemia:Continue statin #15  hypokalemia resolved on oral KCl cont KCL 44meq BID 16.  Loose stools if this does not resolve may need to reduce metformin dose  LOS: 5 days A FACE TO FACE EVALUATION WAS PERFORMED  Charlett Blake 06/14/2019, 9:37 AM

## 2019-06-14 NOTE — Progress Notes (Signed)
Physical Therapy Session Note  Patient Details  Name: Tammy Nunez MRN: [id number] Date of Birth: 10/10/1951  Today's Date: 06/14/2019 PT Individual Time: 0900-0930 PT Individual Time Calculation (min): 30 min   Short Term Goals: Week 1:  PT Short Term Goal 1 (Week 1): Pt will perform supine<>sit with mod assist PT Short Term Goal 2 (Week 1): Pt will perform sit<>stand with min assist PT Short Term Goal 3 (Week 1): Pt will perform bed<>chair transfers with min assist PT Short Term Goal 4 (Week 1): Pt will ambulate 85ft using LRAD with max assist  Skilled Therapeutic Interventions/Progress Updates:    OT reported legrest for RLE does not fit hemi-height w/c. Attempted to find one to fit, but none were available. Focused on NMR to address sit <> stands and transitional movements, postural control re-training in standing and during transitional movements, forced weightbearing through LLE and LUE in standing positions (utilized 4" step under RLE for bias to L also), and weightshifting with manual facilitation for movement and verbal and tactile cues. In seated position without back support focused on LLE coordination and motor control for tapping toes to a target. Pt with noted decreased proprioception and awareness of positioning of self in space. Pt requires mod assist for sit <> stands using parallel bars for support during these activities.  Therapy Documentation Precautions:  Precautions Precautions: Fall, Other (comment) Precaution Comments: left hemiparesis Restrictions Weight Bearing Restrictions: No  Pain:  No complaints.     Therapy/Group: Individual Therapy  Canary Brim Ivory Broad, PT, DPT, CBIS  06/14/2019, 9:38 AM

## 2019-06-14 NOTE — Progress Notes (Signed)
Occupational Therapy Session Note  Patient Details  Name: Tammy Nunez MRN: [id number] Date of Birth: Jun 24, 1951  Today's Date: 06/14/2019 OT Individual Time: 0800-0900 OT Individual Time Calculation (min): 60 min    Short Term Goals: Week 1:  OT Short Term Goal 1 (Week 1): Pt will be able to sit to stand with min A during LB dressing. OT Short Term Goal 2 (Week 1): Pt will be able to transfer into shower with min A. OT Short Term Goal 3 (Week 1): Pt will complete toilet transfer with min A. OT Short Term Goal 4 (Week 1): Pt will be able to don shirt with min A. OT Short Term Goal 5 (Week 1): Pt will be able to don pants with mod A.  Skilled Therapeutic Interventions/Progress Updates:    Pt transferred from supine to sit with min assist in preparation for selfcare tasks.  She then utilized the [facility] for sit to stand with min assist in order for therapist to clean up bowel incontinence as well as transfer to the shower bench for bathing.  Max assist for removal of soiled brief and for wiping buttocks.  Pt was positioned on the shower bench where she was able to scoot around for better alignment once sitting with overall min assist.  Max instructional cueing with overall mod assist for all bathing sit to stand.  Pt with decreased organization, washing the same parts repeatedly before cueing was initiated.  She was able to integrate the LUE for washing the right arm with mod facilitation as well.  Used Stedy for transfer out of the shower after drying off in order to work on dressing at the sink.  Max demonstrational cueing for all dressing secondary to apraxia and left neglect.  She needed mod assist for donning pullover shirt with max assist for donning all LB clothing including socks and slip on shoes.  Finished session with completion or oraly hygiene with setup at the sink.  PT in at end of session to take over.    Therapy Documentation Precautions:  Precautions Precautions:  Fall Precaution Comments: left hemiparesis Restrictions Weight Bearing Restrictions: No  Pain: Pain Assessment Pain Scale: Faces Pain Score: 0-No pain ADL: See Care Tool Section for some details of ADL  Therapy/Group: Individual Therapy  Khylee Algeo OTR/L 06/14/2019, 12:23 PM

## 2019-06-15 ENCOUNTER — Inpatient Hospital Stay (HOSPITAL_COMMUNITY): Payer: PPO | Admitting: *Deleted

## 2019-06-15 ENCOUNTER — Inpatient Hospital Stay (HOSPITAL_COMMUNITY): Payer: PPO | Admitting: Occupational Therapy

## 2019-06-15 ENCOUNTER — Inpatient Hospital Stay (HOSPITAL_COMMUNITY): Payer: PPO | Admitting: Speech Pathology

## 2019-06-15 ENCOUNTER — Inpatient Hospital Stay (HOSPITAL_COMMUNITY): Payer: PPO

## 2019-06-15 LAB — GLUCOSE, CAPILLARY
Glucose-Capillary: 120 mg/dL — ABNORMAL HIGH (ref 70–99)
Glucose-Capillary: 124 mg/dL — ABNORMAL HIGH (ref 70–99)
Glucose-Capillary: 119 mg/dL — ABNORMAL HIGH (ref 70–99)
Glucose-Capillary: 105 mg/dL — ABNORMAL HIGH (ref 70–99)

## 2019-06-15 NOTE — Progress Notes (Signed)
Occupational Therapy Session Note  Patient Details  Name: Tammy Nunez MRN: [id number] Date of Birth: [birth year]  Today's Date: 06/15/2019 OT Individual Time: 7588-3254 OT Individual Time Calculation (min): 69 min   Short Term Goals: Week 1:  OT Short Term Goal 1 (Week 1): Pt will be able to sit to stand with min A during LB dressing. OT Short Term Goal 2 (Week 1): Pt will be able to transfer into shower with min A. OT Short Term Goal 3 (Week 1): Pt will complete toilet transfer with min A. OT Short Term Goal 4 (Week 1): Pt will be able to don shirt with min A. OT Short Term Goal 5 (Week 1): Pt will be able to don pants with mod A.  Skilled Therapeutic Interventions/Progress Updates:    Pt worked on Futures trader and left visual field deficit compensation.  She was taken to the [place] room where she was asked to complete house and clock copy design.  Pt with decreased ability to orient clock design correctly as well as decreased ability to place all the numbers in.  She placed numbers 12 and then 1-4 on 3 different attempts, but omitted all other numbers.  She also could not correctly place the hands to show 4:00 which was in the picture as well.  She was able to complete house copy with better accuracy but still with the need for mod questioning cueing to place all windows and door.  Next progressed to use of the [employer] in sitting.  Initially, using only the lower left and right quadrants, she was only able to find 3 lights in 1 minute.  When instructed on compensation with head turns to the left she improved to locating 20 and 21 in one minute on 2 intervals with an average reaction time of 2.7-3 seconds.  In a timed interval where the lights stayed on for 3 seconds, she was able to score 19/25 with an average reaction time of 2.1 seconds.  Next, had pt work on Production designer, theatre/television/film with completion of large Connect Four game from seated position.  Had pt reach across her body  with the RUE to retrieve and place game pieces with min assist for balance with the LUE in weightbearing on therapists knee.  She needed mod assist to identify areas of four checkers in a row secondary to visual perceptual deficits.  Finished session with return to the room with call button and phone in reach and safety alarm belt in place.    Therapy Documentation Precautions:  Precautions Precautions: Fall Precaution Comments: left hemiparesis Restrictions Weight Bearing Restrictions: No  Pain: Pain Assessment Pain Scale: Faces Pain Score: 0-No pain ADL: See Care Tool Section for some details of ADL  Therapy/Group: Individual Therapy  Reeda Soohoo OTR/L 06/15/2019, 3:31 PM

## 2019-06-15 NOTE — Evaluation (Signed)
Recreational Therapy Assessment and Plan  Patient Details  Name: Tammy Nunez MRN: [id number] Date of Birth: October 25, 1951 Today's Date: 06/15/2019  Rehab Potential: Good ELOS: 2 weeks   Assessment Problem List:      Patient Active Problem List   Diagnosis Date Noted  . Acute ischemic right MCA stroke ([place]) 06/09/2019  . Acute CVA (cerebrovascular accident) ([place]) 06/06/2019    Past Medical History:      Past Medical History:  Diagnosis Date  . Chronic left shoulder pain    Past Surgical History:       Past Surgical History:  Procedure Laterality Date  . ECTOPIC PREGNANCY SURGERY      Assessment & Plan Clinical Impression: Tammy Nunez is a 68 year old femalerelatively good health(but no medical care for years)who was admitted to [facility] on 06/06/2019 evening with left-sided numbness and weakness that started early a.m.patient's blood pressure was significantly elevated at 214/110 and herblood glucose was elevated at 285. CTA head neck showed severe atherosclerotic disease at both carotid bifurcation and ICA bulbs with >80% stenosis on the right and>70% stenosis on the left. MRI brain showed embolic infarcts in right MCA territory affecting temporal, parietal occipital junction and right frontoparietal vertex with minimal petechial blood products in right posterior temporal parietaloccipital junction without frank hematoma.2D echo done revealing EF greater than 65% with severely increased left ventricular wall thickness.Dr. Clarita Leber that stroke was due to small vessel disease and recommended DAPTand vascularsurgery was consulted for input.  Vascular recommended waiting 2 to 3 weeks and follow-up with Dr. Payton Mccallum for right carotid artery stenting in 2 weeks followed by left carotid artery stenting 6 weeks thereafter. At admission,she was noted to have left facial and LUE twitchingfelt to be due tosimplepartial motor seizures therefore loaded with  Keppra and started on 547m twice daily. She continued to have ongoing symptoms therefore Keppra increased to 1000 mg bid.Downgraded to dysphagia 3 chopped meat due to dental issues. Patient with right gaze preference with left neglect,decreased awareness of deficits, high-level cognitive deficits left-sided weakness withpusher tendencies.CIR recommended due to functional deficits.  Patient transferred to CIR on 06/09/2019 .    Pt presents with decreased activity tolerance, decreased functional mobility, decreased balance, decreased coordination, left inattention, decreased awareness, decreased problem solving Limiting pt's independence with leisure/community pursuits.  Leisure History/Participation Premorbid leisure interest/current participation: NPetra [place]- Flower gardening;Community - Grocery store;Community - Shopping mall Leisure Participation Style: With Family/Friends Awareness of Community Resources: Good-identify 3 post discharge leisure resources Psychosocial / Spiritual Social interaction - Mood/Behavior: Cooperative CAcademic librarianAppropriate for Education?: Yes Strengths/Weaknesses Patient Strengths/Abilities: Willingness to participate;Active premorbidly Patient weaknesses: Physical limitations TR Patient demonstrates impairments in the following area(s): Endurance;Motor;Safety  Plan Rec Therapy Plan Is patient appropriate for Therapeutic Recreation?: Yes Rehab Potential: Good Treatment times per week: Min 1 TR session >20 minutes during LOS Estimated Length of Stay: 2 weeks TR Treatment/Interventions: Adaptive equipment instruction;Community reintegration;Patient/family education;Therapeutic exercise;1:1 session;Functional mobility training;Recreation/leisure participation;Leisure education;Therapeutic activities  Recommendations for other services: None   Discharge Criteria: Patient will be discharged from TR if patient refuses treatment 3 consecutive times  without medical reason.  If treatment goals not met, if there is a change in medical status, if patient makes no progress towards goals or if patient is discharged from hospital.  The above assessment, treatment plan, treatment alternatives and goals were discussed and mutually agreed upon: by patient  [place]7/22/2020, 4:12 PM

## 2019-06-15 NOTE — Progress Notes (Addendum)
[facility] PHYSICAL MEDICINE & REHABILITATION PROGRESS NOTE   Subjective/Complaints: Working hard with PT, doing WC propulsion, was doing quadriped exercise earlier   Diarrhea diminishing  ROS- neg CP SOB N/V/D  Objective:   No results found. Recent Labs    06/13/19 0729  WBC 6.3  HGB 17.1*  HCT 50.3*  PLT 163   Recent Labs    06/13/19 0729  NA 140  K 4.5  CL 111  CO2 20*  GLUCOSE 157*  BUN 16  CREATININE 1.06*  CALCIUM 9.6    Intake/Output Summary (Last 24 hours) at 06/15/2019 0830 Last data filed at 06/14/2019 1853 Gross per 24 hour  Intake 500 ml  Output -  Net 500 ml     Physical Exam: Vital Signs Blood pressure (!) 151/62, pulse 78, temperature 98.1 F (36.7 C), resp. rate 17, height _0  (1.575 m), weight 77.6 kg, SpO2 93 %.  General: No acute distress HEENT R upper lip sutures intact well approximated vermillion border with mild swelling  Mood and affect are appropriate Heart: Regular rate and rhythm no rubs murmurs or extra sounds Lungs: Clear to auscultation, breathing unlabored, no rales or wheezes Abdomen: Positive bowel sounds, soft nontender to palpation, nondistended Extremities: No clubbing, cyanosis, or edema Skin: No evidence of breakdown, no evidence of rash Neurologic: Cranial nerves II through XII intact, motor strength is 2-/5 in left , bicep, tricep,2- finger flexors , 2- hip flexor, knee extensors, ankle dorsiflexor and plantar flexor  Musculoskeletal: Full range of motion in all 4 extremities. No joint swelling    Assessment/Plan: 1. Functional deficits secondary to Left Hemi R MCA which require 3+ hours per day of interdisciplinary therapy in a comprehensive inpatient rehab setting.  Physiatrist is providing close team supervision and 24 hour management of active medical problems listed below.  Physiatrist and rehab team continue to assess barriers to discharge/monitor patient progress toward functional and medical  goals  Care Tool:  Bathing    Body parts bathed by patient: Left arm, Chest, Abdomen, Front perineal area, Right upper leg, Left upper leg, Right lower leg, Face   Body parts bathed by helper: Left lower leg, Buttocks, Right arm     Bathing assist Assist Level: Moderate Assistance - Patient 50 - 74%     Upper Body Dressing/Undressing Upper body dressing Upper body dressing/undressing activity did not occur (including orthotics): Safety/medical concerns What is the patient wearing?: Pull over shirt    Upper body assist Assist Level: Moderate Assistance - Patient 50 - 74%    Lower Body Dressing/Undressing Lower body dressing      What is the patient wearing?: Incontinence brief, Pants     Lower body assist Assist for lower body dressing: Maximal Assistance - Patient 25 - 49%     Toileting Toileting    Toileting assist Assist for toileting: Maximal Assistance - Patient 25 - 49%     Transfers Chair/bed transfer  Transfers assist     Chair/bed transfer assist level: Moderate Assistance - Patient 50 - 74%     Locomotion Ambulation   Ambulation assist      Assist level: 2 helpers Assistive device: Walker-rolling Max distance: 10 ft   Walk 10 feet activity   Assist     Assist level: 2 helpers Assistive device: Walker-rolling   Walk 50 feet activity   Assist Walk 50 feet with 2 turns activity did not occur: Safety/medical concerns         Walk 150 feet activity  Assist Walk 150 feet activity did not occur: Safety/medical concerns         Walk 10 feet on uneven surface  activity   Assist Walk 10 feet on uneven surfaces activity did not occur: Safety/medical concerns         Wheelchair     Assist Will patient use wheelchair at discharge?: (TBD) Type of Wheelchair: Manual    Wheelchair assist level: Minimal Assistance - Patient > 75% Max wheelchair distance: 150 ft    Wheelchair 50 feet with 2 turns  activity    Assist        Assist Level: Minimal Assistance - Patient > 75%   Wheelchair 150 feet activity     Assist     Assist Level: Minimal Assistance - Patient > 75%    Medical Problem List and Plan: 1.Functional deficits and left hemiparesissecondary to right MCA distribution embolic infarcts CIR evals PT, OT, SLP,Team conference today please see physician documentation under team conference tab, met with team face-to-face to discuss problems,progress, and goals. Formulized individual treatment plan based on medical history, underlying problem and comorbidities. 2. Antithrombotics: -DVT/anticoagulation:Pharmaceutical:Lovenox -antiplatelet therapy: ASA/Plavix 3. Pain Management:Tylenol prn 4. Mood:LCSW to follow for evaluation and support. -antipsychotic agents: N/A 5. Neuropsych: This patientiscapable of making decisions on herown behalf. 6. Skin/Wound Care:Routine pressure relief measuresRIght Upper lip   laceration sutures out next week  7. Fluids/Electrolytes/Nutrition:Monitor I/O. -I personally reviewed the patient's labs today. -replace potassium (3.0) 8. T2DM: New diagnosis with Hgb A1c-9.3. Educate on Carb modified diet. Change Lantus/novolog to low dose metformin titrate upwardsas indicated. Will continue to use sliding scale insulin for elevated blood sugars. CBG (last 3)  Recent Labs    06/14/19 1632 06/14/19 2145 06/15/19 0638  GLUCAP 120* 119* 120*  controlled 7/22  cont metformin and SSI but developed diarrhea since dose increased to 879m BID, will reduce to 5048mand monitor  IF CBG ok but diarrhea persists consider dropping dose to 25052m9. Bilateral carotid artery stenosis:Follow-up with vascular surgery after discharge 10. New onsetsimple partialseizures: Continue Keppra 1000 mg bid. 11. Morbid obesity: BMI31.0--educated patient on importance of weight loss to  help with overall health and mobility. 12. HTN: Monitor BP tid--avoid hypotension.  Vitals:   06/14/19 1936 06/15/19 0306  BP: (!) 157/65 (!) 151/62  Pulse: 87 78  Resp: 18 17  Temp: 98.1 F (36.7 C) 98.1 F (36.7 C)  SpO2: 100% 93%  Will increase meds end of week if it remains elevated  13. Tobacco use: Continue to educate on importance of cessation.  14. Dyslipidemia:Continue statin #15 hypokalemia resolved on oral KCl cont KCL 81m80mID 16.  Loose stools if this does not resolve may need to reduce metformin dose  LOS: 6 days A FACE TO FACE EVALUATION WAS PERFORMED  AndrCharlett Blake2/2020, 8:30 AM

## 2019-06-15 NOTE — Progress Notes (Signed)
Physical Therapy Session Note  Patient Details  Name: Tammy Nunez MRN: [id number] Date of Birth: 1951-02-06  Today's Date: 06/15/2019 PT Individual Time: 4098-1191 and 1131-1200 PT Individual Time Calculation (min): 44 min and 29 min   Short Term Goals: Week 1:  PT Short Term Goal 1 (Week 1): Pt will perform supine<>sit with mod assist PT Short Term Goal 2 (Week 1): Pt will perform sit<>stand with min assist PT Short Term Goal 3 (Week 1): Pt will perform bed<>chair transfers with min assist PT Short Term Goal 4 (Week 1): Pt will ambulate 6ft using LRAD with max assist  Skilled Therapeutic Interventions/Progress Updates:    Session 1: Pt supine in bed upon PT arrival, agreeable to therapy tx and denies pain. Pt transferred to sitting EOB with min assist and cues for techniques. Pt reports being incontinent of bowel overnight. Pt performed stand pivot bed>w/c and then w/c<>toilet this session with min-mod assist. Pt maintained standing balance with min assist while therapist assisted with clothing management. Pt continent of bladder this session on toilet. Pt maintained standing balance with min assist no UE support while performing pericare with set up assist. Pt wash hands then transported to the gym. Pt transferred from sit>stand with min assist and then transferred to tall kneeling with UE support on the bench with mod assist to get into position, cues for techniques. In tall kneeling pt worked on postural control, hip strength and balance while performing overhead reaching activity with R UE, cues for midline and facilitation for increased hip extension. Pt transferred to quadruped with therapist providing stabilization to L UE to maintain elbow extension, pt able to maintain position x 2 minutes working on neuro re-ed, postural control and L sided weightbearing. Pt transferred quadruped>supine with mod assist and laid supine on mat with LEs hanging off for bilateral hip flexor stretching this  session x 1 min each side and quad stretch x 1 min per side. Supine>sit with min assist and cues for techniques. Pt transferred to w/c min assist and transported back to room. Pt propelled w/c x 50 ft with min assist using R LE only, cues for techniques. Pt left seated in w/c with needs in reach and chair alarm set.   Session 2:  Pt seated in w/c upon PT arrival, agreeable to therapy tx and denies pain. Therapist noted small yellow pill in patients lap, therapist notified RN Santiago Glad). Pt transported to the gym in w/c. Pt performed stand pivot to the mat with mod assist. Pt worked on knee control this session to perform x 10 mini squats with cues for symmetric LE weightbearing. Pt worked on L LE weightbearing while performing mini squats with dynadisc under R LE, min assist and cues for techniques. Pt worked on pre-gait without AD, increased extensor tone noted today with L LE during L swing leading to scissoring and adduction with L foot placement. Improved L stance control today when stepping in place with R LE. Therapist assessed L LE spasticity with a 2 in quadriceps on modified ashworth scale. Pt performed blocked practice of stand pivot transfers x 3 in each direction with min assist and cues for techniques, cues for attention to L LE placement prior to transfer. Pt transported back to room and left in w/c with needs in reach and chair alarm set.    Therapy Documentation Precautions:  Precautions Precautions: Fall Precaution Comments: left hemiparesis Restrictions Weight Bearing Restrictions: No    Therapy/Group: Individual Therapy  Netta Corrigan, PT, DPT 06/15/2019,  7:46 AM

## 2019-06-15 NOTE — Progress Notes (Signed)
Speech Language Pathology Daily Session Note  Patient Details  Name: Tammy Nunez MRN: [id number] Date of Birth: 1951/08/13  Today's Date: 06/15/2019 SLP Individual Time: 1300-1345 SLP Individual Time Calculation (min): 45 min  Short Term Goals: Week 1: SLP Short Term Goal 1 (Week 1): Patient will demonstrate efficient mastication with complete oral clearance with trials of regular textures of 2 sessions prior to upgrade. SLP Short Term Goal 2 (Week 1): Patient will demonstrate complex problem solving for familiar tasks with supervision verbal cues. SLP Short Term Goal 3 (Week 1): Patient will self-monitor and correct errors during functional tasks with supervision verbal cues. SLP Short Term Goal 4 (Week 1): Patient will scan to left field of enviornment during functional tasks with Mod A verbal cues.  Skilled Therapeutic Interventions:  Skilled treatment session focused on cognition. SLP facilitiated session by providing Max A faded to Mod A cues to count money (money placed on white paper for color contrast from brown woodgrain of bedside table and placed to pt's right). Given Mod A cues, pt able to utilize money to make simple amounts and to provide simple change. Pt left upright in wheelchair with all needs within reach. Continue per current plan of care.        Pain    Therapy/Group: Individual Therapy  Macguire Holsinger 06/15/2019, 2:14 PM

## 2019-06-16 ENCOUNTER — Inpatient Hospital Stay (HOSPITAL_COMMUNITY): Payer: PPO | Admitting: Occupational Therapy

## 2019-06-16 ENCOUNTER — Inpatient Hospital Stay (HOSPITAL_COMMUNITY): Payer: PPO | Admitting: Physical Therapy

## 2019-06-16 ENCOUNTER — Inpatient Hospital Stay (HOSPITAL_COMMUNITY): Payer: PPO | Admitting: *Deleted

## 2019-06-16 ENCOUNTER — Inpatient Hospital Stay (HOSPITAL_COMMUNITY): Payer: PPO | Admitting: Speech Pathology

## 2019-06-16 LAB — GLUCOSE, CAPILLARY
Glucose-Capillary: 99 mg/dL (ref 70–99)
Glucose-Capillary: 153 mg/dL — ABNORMAL HIGH (ref 70–99)
Glucose-Capillary: 137 mg/dL — ABNORMAL HIGH (ref 70–99)
Glucose-Capillary: 142 mg/dL — ABNORMAL HIGH (ref 70–99)

## 2019-06-16 MED ORDER — METFORMIN HCL 500 MG PO TABS
250.0000 mg | ORAL_TABLET | Freq: Two times a day (BID) | ORAL | Status: DC
  Administered 2019-06-16 – 2019-06-22 (×13): 250 mg via ORAL
  Filled 2019-06-16 (×13): qty 1

## 2019-06-16 MED ORDER — LISINOPRIL 20 MG PO TABS
20.0000 mg | ORAL_TABLET | Freq: Every day | ORAL | Status: DC
  Administered 2019-06-17 – 2019-07-01 (×15): 20 mg via ORAL
  Filled 2019-06-16 (×15): qty 1

## 2019-06-16 NOTE — Progress Notes (Signed)
Occupational Therapy Session Note  Patient Details  Name: Tammy Nunez MRN: [id number] Date of Birth: 01-30-1951  Today's Date: 06/16/2019 OT Individual Time: 2482-5003 OT Individual Time Calculation (min): 71 min    Short Term Goals: Week 1:  OT Short Term Goal 1 (Week 1): Pt will be able to sit to stand with min A during LB dressing. OT Short Term Goal 2 (Week 1): Pt will be able to transfer into shower with min A. OT Short Term Goal 3 (Week 1): Pt will complete toilet transfer with min A. OT Short Term Goal 4 (Week 1): Pt will be able to don shirt with min A. OT Short Term Goal 5 (Week 1): Pt will be able to don pants with mod A.  Skilled Therapeutic Interventions/Progress Updates:    Pt completed shower and dressing during session.  She had bowel incontinence in brief noted with supine to sit with mod assist.  Stedy utilized for standing to clean up as well as for transfer to the walk-in shower.  Once in the shower she was able to wash with mod instructional cueing to sequence as well as max hand over hand assist for washing the RUE with the LUE.  She needed mod assist for standing balance in order to complete washing peri area.  Dressing completed at the sink after transfer out of the shower with use of the [facility] for safety.  She was able to complete UB dressing with mod assist and max demonstrational cueing for orientation of clothing and sequencing.  Mod assist for donning brief and pants as well as max assist to use one handed technique for donning gripper socks.  Finished session with pt up in the wheelchair with call button and phone in reach and pt working on breakfast.    Therapy Documentation Precautions:  Precautions Precautions: Fall Precaution Comments: left hemiparesis Restrictions Weight Bearing Restrictions: No  Pain: Pain Assessment Pain Scale: Faces Pain Score: 0-No pain ADL: See Care Tool Section for some details  Therapy/Group: Individual  Therapy  Kaiesha Tonner OTR/L 06/16/2019, 12:10 PM

## 2019-06-16 NOTE — Progress Notes (Signed)
Speech Language Pathology Daily Session Note  Patient Details  Name: Tammy Nunez MRN: [id number] Date of Birth: [birth year]  Today's Date: 06/16/2019 SLP Individual Time: 1355-1440 SLP Individual Time Calculation (min): 45 min  Short Term Goals: Week 1: SLP Short Term Goal 1 (Week 1): Patient will demonstrate efficient mastication with complete oral clearance with trials of regular textures of 2 sessions prior to upgrade. SLP Short Term Goal 2 (Week 1): Patient will demonstrate complex problem solving for familiar tasks with supervision verbal cues. SLP Short Term Goal 3 (Week 1): Patient will self-monitor and correct errors during functional tasks with supervision verbal cues. SLP Short Term Goal 4 (Week 1): Patient will scan to left field of enviornment during functional tasks with Mod A verbal cues.  Skilled Therapeutic Interventions:  Skilled treatment session focused on cognition goals. SLP facilitated session by administering the [facility]. Pt obtained score of 16 out of 22 (n=>18). Pt with deficits in mental math and took more than a reasonable amount of time generating word list. SLP further facilitated session by providing Max A faded to Mod A cues to locate items to left of midline within basic task. Pt left upright in wheelchair, lap belt alarm on and all needs within reach. Continue per current plan of care.      Pain    Therapy/Group: Individual Therapy  Gilberta Peeters 06/16/2019, 2:59 PM

## 2019-06-16 NOTE — Progress Notes (Signed)
Occupational Therapy Weekly Progress Note  Patient Details  Name: Tammy Nunez MRN: [id number] Date of Birth: 1951/05/10  Beginning of progress report period: June 10, 2019 End of progress report period: June 16, 2019  Today's Date: 06/16/2019 OT Individual Time: 5732-2025 OT Individual Time Calculation (min): 29 min    Patient has met 2 of 5 short term goals.  Mrs. Diss is making steady progress with OT currently.  She is able to complete squat pivot transfers to the wheelchair with min assist.  Stand pivot transfers are completed with mod to max or with use of the Stedy to the shower or the toilet.  She continues to exhibit LUE and LLE hemiparesis with decreased proprioception as well as left neglect and left visual field cut.  She demonstrates Brunnstrum stage IV movement in the left arm and stage III in the hand.  Increased flexor tone is present in the finger and biceps as well with max hand over hand assistance needed to use the LUE for bathing tasks at this time.  She demonstrates dressing apraxia with decreased ability to visually locate parts of her clothing to sequence dressing as well as being able to follow hemi-dressing techniques.  Feel she will continue to benefit from CIR level therapies with expected discharge the first week of August.  Will downgrade current goals at overall supervision level as her visual perceptual deficits as well as her decreased proprioception with require he to need at least min assist at discharge.    Patient continues to demonstrate the following deficits: muscle weakness, impaired timing and sequencing, abnormal tone, unbalanced muscle activation and decreased coordination, decreased visual perceptual skills and field cut, decreased attention to left and left side neglect, decreased awareness and decreased problem solving and decreased sitting balance, decreased standing balance, decreased postural control, hemiplegia and decreased balance strategies  and therefore will continue to benefit from skilled OT intervention to enhance overall performance with BADL and Reduce care partner burden.  Patient not progressing toward long term goals.  See goal revision..  Continue plan of care.  OT Short Term Goals Week 2:  OT Short Term Goal 1 (Week 2): Pt will complete toilet transfer with min A. OT Short Term Goal 2 (Week 2): Pt will be able to don shirt with min A. OT Short Term Goal 3 (Week 2): Pt will complete shower transfer with mod assist stand pivot. OT Short Term Goal 4 (Week 2): Pt will scan left of midline for locating grooming items at the sink with no more than min questioning cueing. OT Short Term Goal 5 (Week 2): Pt will use the LUE for washing the right arm with mod assist during bathing tasks.  Skilled Therapeutic Interventions/Progress Updates:    Pt in wheelchair to start session.  Had her maintain sitting position while NMES was applied to the left dorsal forearm for facilitation of digit extensors.  Intensity set on level 20 custom program for 20 mins duration as well.  PPS were at 35 with pulse width at 300.  On/off time 5 seconds each.  While stimulation was active, therapist had pt work on shoulder flexion, elbow extension, and elbow extension with simulated reaching patterns to overhead and out in front of her.  Mod assist needed to complete.  No adverse reactions noted to stimuli post treatment.  Pt left in the wheelchair at end of session with call button and phone in reach with safety belt in place.    Therapy Documentation Precautions:  Precautions Precautions:  Fall Precaution Comments: left hemiparesis Restrictions Weight Bearing Restrictions: No  Pain: Pain Assessment Pain Scale: Faces Pain Score: 0-No pain  Therapy/Group: Individual Therapy  , OTR/L 06/16/2019, 4:50 PM

## 2019-06-16 NOTE — Progress Notes (Signed)
[facility] PHYSICAL MEDICINE & REHABILITATION PROGRESS NOTE   Subjective/Complaints: Has not had suture removed thus far  Diarrhea diminishing  ROS- neg CP SOB N/V/D  Objective:   No results found. No results for input(s): WBC, HGB, HCT, PLT in the last 72 hours. No results for input(s): NA, K, CL, CO2, GLUCOSE, BUN, CREATININE, CALCIUM in the last 72 hours.  Intake/Output Summary (Last 24 hours) at 06/16/2019 1540 Last data filed at 06/16/2019 1509 Gross per 24 hour  Intake 220 ml  Output -  Net 220 ml     Physical Exam: Vital Signs Blood pressure (!) 153/66, pulse 95, temperature (!) 97.5 F (36.4 C), resp. rate 18, height 5\' 2"  (1.575 m), weight 77 kg, SpO2 100 %.  General: No acute distress HEENT R upper lip sutures intact well approximated vermillion border with mild swelling  Mood and affect are appropriate Heart: Regular rate and rhythm no rubs murmurs or extra sounds Lungs: Clear to auscultation, breathing unlabored, no rales or wheezes Abdomen: Positive bowel sounds, soft nontender to palpation, nondistended Extremities: No clubbing, cyanosis, or edema Skin: No evidence of breakdown, no evidence of rash Neurologic: Cranial nerves II through XII intact, motor strength is 2-/5 in left , bicep, tricep,2- finger flexors , 2- hip flexor, knee extensors, ankle dorsiflexor and plantar flexor  Musculoskeletal: Full range of motion in all 4 extremities. No joint swelling    Assessment/Plan: 1. Functional deficits secondary to Left Hemi R MCA which require 3+ hours per day of interdisciplinary therapy in a comprehensive inpatient rehab setting.  Physiatrist is providing close team supervision and 24 hour management of active medical problems listed below.  Physiatrist and rehab team continue to assess barriers to discharge/monitor patient progress toward functional and medical goals  Care Tool:  Bathing    Body parts bathed by patient: Left arm, Chest, Abdomen,  Front perineal area, Right upper leg, Left upper leg, Right lower leg, Face   Body parts bathed by helper: Right arm, Left lower leg, Buttocks     Bathing assist Assist Level: Moderate Assistance - Patient 50 - 74%     Upper Body Dressing/Undressing Upper body dressing Upper body dressing/undressing activity did not occur (including orthotics): Safety/medical concerns What is the patient wearing?: Pull over shirt    Upper body assist Assist Level: Maximal Assistance - Patient 25 - 49%    Lower Body Dressing/Undressing Lower body dressing      What is the patient wearing?: Incontinence brief, Pants     Lower body assist Assist for lower body dressing: Maximal Assistance - Patient 25 - 49%     Toileting Toileting    Toileting assist Assist for toileting: Maximal Assistance - Patient 25 - 49%     Transfers Chair/bed transfer  Transfers assist     Chair/bed transfer assist level: Moderate Assistance - Patient 50 - 74%     Locomotion Ambulation   Ambulation assist      Assist level: 2 helpers Assistive device: Other (comment)(3 Musketeer) Max distance: 5ft   Walk 10 feet activity   Assist     Assist level: 2 helpers Assistive device: Other (comment)(3 Musketeers)   Walk 50 feet activity   Assist Walk 50 feet with 2 turns activity did not occur: Safety/medical concerns         Walk 150 feet activity   Assist Walk 150 feet activity did not occur: Safety/medical concerns         Walk 10 feet on uneven surface  activity   Assist Walk 10 feet on uneven surfaces activity did not occur: Safety/medical concerns         Wheelchair     Assist Will patient use wheelchair at discharge?: (TBD) Type of Wheelchair: Manual    Wheelchair assist level: Minimal Assistance - Patient > 75% Max wheelchair distance: 50 ft    Wheelchair 50 feet with 2 turns activity    Assist        Assist Level: Minimal Assistance - Patient > 75%    Wheelchair 150 feet activity     Assist     Assist Level: Minimal Assistance - Patient > 75%    Medical Problem List and Plan: 1.Functional deficits and left hemiparesissecondary to right MCA distribution embolic infarcts CIR evals PT, OT, SLP, progressing overall well but has difficulties related to left upper extremity and left lower extremity proprioceptive deficits 2. Antithrombotics: -DVT/anticoagulation:Pharmaceutical:Lovenox -antiplatelet therapy: ASA/Plavix 3. Pain Management:Tylenol prn 4. Mood:LCSW to follow for evaluation and support. -antipsychotic agents: N/A 5. Neuropsych: This patientiscapable of making decisions on herown behalf. 6. Skin/Wound Care:Routine pressure relief measuresRIght Upper lip   laceration sutures out this week  7. Fluids/Electrolytes/Nutrition:Monitor I/O. -I personally reviewed the patient's labs today. -replace potassium (3.0) 8. T2DM: New diagnosis with Hgb A1c-9.3. Educate on Carb modified diet. Change Lantus/novolog to low dose metformin titrate upwardsas indicated. Will continue to use sliding scale insulin for elevated blood sugars. CBG (last 3)  Recent Labs    06/15/19 2113 06/16/19 0626 06/16/19 1140  GLUCAP 105* 99 153*  controlled 7/22  cont metformin and SSI but developed diarrhea since dose increased to 850mg  BID, will reduce to 500mg  and monitor  IF CBG ok but diarrhea persists reduce metformin dose to 250mg   9. Bilateral carotid artery stenosis:Follow-up with vascular surgery after discharge 10. New onsetsimple partialseizures: Continue Keppra 1000 mg bid. 11. Morbid obesity: BMI31.0--educated patient on importance of weight loss to help with overall health and mobility. 12. HTN: Monitor BP tid--avoid hypotension.  Vitals:   06/16/19 0517 06/16/19 1447  BP: (!) 143/65 (!) 153/66  Pulse: 81 95  Resp: 16 18  Temp: 98.3 F (36.8 C) (!)  97.5 F (36.4 C)  SpO2: 96% 100%  Will increase Zestril to 20 mg/day 13. Tobacco use: Continue to educate on importance of cessation.  14. Dyslipidemia:Continue statin #15 hypokalemia resolved on oral KCl cont KCL 44meq BID 16.  Loose stools need to reduce metformin dose  LOS: 7 days A FACE TO FACE EVALUATION WAS PERFORMED  Charlett Blake 06/16/2019, 3:40 PM

## 2019-06-16 NOTE — Progress Notes (Signed)
Recreational Therapy Session Note  Patient Details  Name: Tammy Nunez MRN: [id number] Date of Birth: 1951/10/21 Today's Date: 06/16/2019 Time:  1130-1150 Pain: no c/o Skilled Therapeutic Interventions/Progress Updates: Pt fatigued from previous therapy session stating her morning had been very busy.  Session focused on relaxation strategies with emphasis on diapharagmatic breathing as it was introduced to pt yesterday during assessment.  Pt stated that she used deep breathing to help her unwind before bed last night and further stated that it was effective her to relax.  Also discussed activity modifications for her leisure interests once she returns home.  She is anxious to get back to doing her activities of choice.  [place] 06/16/2019, 12:09 PM

## 2019-06-16 NOTE — Progress Notes (Signed)
Physical Therapy Session Note  Patient Details  Name: Tammy Nunez MRN: [id number] Date of Birth: [birth year]  Today's Date: 06/16/2019 PT Individual Time: 0626-9485 PT Individual Time Calculation (min): 71 min   Short Term Goals: Week 1:  PT Short Term Goal 1 (Week 1): Pt will perform supine<>sit with mod assist PT Short Term Goal 2 (Week 1): Pt will perform sit<>stand with min assist PT Short Term Goal 3 (Week 1): Pt will perform bed<>chair transfers with min assist PT Short Term Goal 4 (Week 1): Pt will ambulate 18ft using LRAD with max assist  Skilled Therapeutic Interventions/Progress Updates:    Pt received sitting in w/c and agreeable to therapy session. Transported to/from gym in w/c. Sit<>stands to/from w/c with min assist for balance throughout session - cuing for attention to L LE placement prior to standing. Donned litegait harness with +2 assist while standing with R UE support on RW and min assist/CGA for balance. Ambulated overground ~46ft in litegait harness with +2 assist for managing litegait and L UE handheld support on bars and assisting with L LE placement during swing phase and hip/knee extension control during stance phase - pt demonstrated significantly increased L LE step length with poor proprioceptive awareness and intermittent L LE ankle inversion on initial contact. Ambulated ~21ft with mod assist +2 via 3 Musketeer support - assist for L LE foot clearance and placement during swing phase (due to foot placed adducted) and manual facilitation for L weightshift and hip extension during L LE stance phase - no knee buckling noted but intermittent L knee hyperextension. Ambulated 63ft with R UE support on hallway rail and mod assist of 1 person for balance/trunk control and continued manual facilitation/assist for L LE swing and stance phase progressed to 69ft with R UE handheld assist of 1 and mod assist of 2nd for continued assist as described above - max cuing throughout  for sequencing and improved gait mechanics. Transported to main therapy gym in w/c. Squat pivots w/c<>EOM with mod assist for L LE control and pivoting hips. Performed pre-gait training focusing on L LE NMR for swing phase to increase foot clearance and abduction with stepping forward/backwards to external target, no AD, with mod/max assist for balance. Sit>supine with min assist for LE management. Supine>quadruped with max assist of +2 for L LE management and L UE weight bearing control. Quadruped>tall kneeling with max assist for trunk upright and +2 assist for bench placement.. In tall kneeling with B UE support on bench performed lateral weight shifting to step knees laterally in/out focusing on trunk control and sustained hip extension on stance limb - mirror for visual feedback with manual facilitation and max cuing for sequencing and proper muscle activation. Tall kneeling>sitting with mod assist for rotating hips. Squat pivot back to w/c and returned to room in w/c and left sitting in w/c with needs in reach and seat belt alarm on.  Therapy Documentation Precautions:  Precautions Precautions: Fall Precaution Comments: left hemiparesis Restrictions Weight Bearing Restrictions: No  Pain: Denies pain during session.   Therapy/Group: Individual Therapy  Tawana Scale, PT, DPT 06/16/2019, 8:00 AM

## 2019-06-16 NOTE — Plan of Care (Signed)
  Problem: RH Balance Goal: LTG Patient will maintain dynamic standing with ADLs (OT) Description: LTG:  Patient will maintain dynamic standing balance with assist during activities of daily living (OT)  Flowsheets (Taken 06/16/2019 1702) LTG: Pt will maintain dynamic standing balance during ADLs with: (goal downgraded based on progress) Minimal Assistance - Patient > 75%   Problem: Sit to Stand Goal: LTG:  Patient will perform sit to stand in prep for activites of daily living with assistance level (OT) Description: LTG:  Patient will perform sit to stand in prep for activites of daily living with assistance level (OT) Flowsheets (Taken 06/16/2019 1702) LTG: PT will perform sit to stand in prep for activites of daily living with assistance level: (goal downgraded based on slower progress than expected) Contact Guard/Touching assist   Problem: RH Bathing Goal: LTG Patient will bathe all body parts with assist levels (OT) Description: LTG: Patient will bathe all body parts with assist levels (OT) Flowsheets (Taken 06/16/2019 1702) LTG: Pt will perform bathing with assistance level/cueing: (goal downgraded based on slower progress than expected) Minimal Assistance - Patient > 75%   Problem: RH Dressing Goal: LTG Patient will perform lower body dressing w/assist (OT) Description: LTG: Patient will perform lower body dressing with assist, with/without cues in positioning using equipment (OT) Flowsheets (Taken 06/16/2019 1702) LTG: Pt will perform lower body dressing with assistance level of: (goal downgraded based on slower progress than expected) Minimal Assistance - Patient > 75%   Problem: RH Toileting Goal: LTG Patient will perform toileting task (3/3 steps) with assistance level (OT) Description: LTG: Patient will perform toileting task (3/3 steps) with assistance level (OT)  Flowsheets (Taken 06/16/2019 1702) LTG: Pt will perform toileting task (3/3 steps) with assistance level: (Goal  downgraded based on slower progress than expected) Minimal Assistance - Patient > 75%   Problem: RH Toilet Transfers Goal: LTG Patient will perform toilet transfers w/assist (OT) Description: LTG: Patient will perform toilet transfers with assist, with/without cues using equipment (OT) Flowsheets (Taken 06/16/2019 1702) LTG: Pt will perform toilet transfers with assistance level of: (Goal downgraded based on slower progress than expected) Minimal Assistance - Patient > 75%   Problem: RH Tub/Shower Transfers Goal: LTG Patient will perform tub/shower transfers w/assist (OT) Description: LTG: Patient will perform tub/shower transfers with assist, with/without cues using equipment (OT) Flowsheets (Taken 06/16/2019 1702) LTG: Pt will perform tub/shower stall transfers with assistance level of: (Goal downgraded based on slower progress than expected) Minimal Assistance - Patient > 75%

## 2019-06-16 NOTE — Plan of Care (Signed)
  Problem: Consults Goal: RH STROKE PATIENT EDUCATION Description: See Patient Education module for education specifics  Outcome: Progressing   Problem: RH BLADDER ELIMINATION Goal: RH STG MANAGE BLADDER WITH ASSISTANCE Description: STG Manage Bladder With Mod I Assistance Outcome: Progressing   Problem: RH SKIN INTEGRITY Goal: RH STG SKIN FREE OF INFECTION/BREAKDOWN Description: No new breakdown with supervision cues/assist  Outcome: Progressing Goal: RH STG ABLE TO PERFORM INCISION/WOUND CARE W/ASSISTANCE Description: STG Able To Perform Incision/Wound Care to lip With mod I Assistance. Outcome: Progressing   Problem: RH COGNITION-NURSING Goal: RH STG ANTICIPATES NEEDS/CALLS FOR ASSIST W/ASSIST/CUES Description: STG Anticipates Needs/Calls for Assist With supervision Assistance/Cues. Outcome: Progressing   Problem: RH KNOWLEDGE DEFICIT Goal: RH STG INCREASE KNOWLEDGE OF DIABETES Description: Pt will be able to verbalize understanding of blood sugar monitoring, dietary and medical management of diabetes, and importance of follow up care with physician at discharge.  Outcome: Progressing Goal: RH STG INCREASE KNOWLEGDE OF HYPERLIPIDEMIA Description: Pt will demonstrate increased knowledge of relationship of cholesterol and stroke prevention and will be able to verbalize ways to control cholesterol levels at the time of discharge.  Outcome: Progressing Goal: RH STG INCREASE KNOWLEDGE OF STROKE PROPHYLAXIS Description: Pt will demonstrate increased knowledge of stroke prevention techniques and lifestyle modifications for preventing future strokes at the time of discharge with min assist/cues.  Outcome: Progressing

## 2019-06-17 ENCOUNTER — Inpatient Hospital Stay (HOSPITAL_COMMUNITY): Payer: PPO | Admitting: Speech Pathology

## 2019-06-17 ENCOUNTER — Inpatient Hospital Stay (HOSPITAL_COMMUNITY): Payer: PPO | Admitting: Occupational Therapy

## 2019-06-17 ENCOUNTER — Inpatient Hospital Stay (HOSPITAL_COMMUNITY): Payer: PPO

## 2019-06-17 LAB — GLUCOSE, CAPILLARY
Glucose-Capillary: 148 mg/dL — ABNORMAL HIGH (ref 70–99)
Glucose-Capillary: 123 mg/dL — ABNORMAL HIGH (ref 70–99)
Glucose-Capillary: 110 mg/dL — ABNORMAL HIGH (ref 70–99)
Glucose-Capillary: 141 mg/dL — ABNORMAL HIGH (ref 70–99)

## 2019-06-17 NOTE — Progress Notes (Signed)
Occupational Therapy Session Note  Patient Details  Name: Tammy Nunez MRN: [id number] Date of Birth: [birth year]  Today's Date: 06/17/2019 OT Individual Time: 1100-1157 OT Individual Time Calculation (min): 57 min   Short Term Goals: Week 2:  OT Short Term Goal 1 (Week 2): Pt will complete toilet transfer with min A. OT Short Term Goal 2 (Week 2): Pt will be able to don shirt with min A. OT Short Term Goal 3 (Week 2): Pt will complete shower transfer with mod assist stand pivot. OT Short Term Goal 4 (Week 2): Pt will scan left of midline for locating grooming items at the sink with no more than min questioning cueing. OT Short Term Goal 5 (Week 2): Pt will use the LUE for washing the right arm with mod assist during bathing tasks.  Skilled Therapeutic Interventions/Progress Updates:    Pt greeted sitting in wc and agreeable to OT treatment session. Worked on wc propulsion using hemi-techniques with min/mod A 2/2 difficulty steering, improved with practice. Pt completed squat-pivots throughout session with mod A. Applied 1:1 NMES to CH1 wrist extensors.  Ratio 1:1 Rate 35 pps Waveform- Asymmetric Ramp 1.0 Pulse 300 CH1 Intensity- 15  Duration -  15  Pt brought into supine and L hand attached to plastic dowel rod using co-band. Facilitated B UE shoulder activation with OT providing distal support for supine chest press and triceps press. OT facilitation and support to L side throughout activity. Pt transitioned into sidelying and L UE supportned on tray table. Pt with good shoulder activation to achieve active shoulder FF-3 sets of 15 reps for neuro re-ed. Pt returned to room and left seated in with with alarm belt on and tray table in place awaiting lunch.   Therapy Documentation Precautions:  Precautions Precautions: Fall Precaution Comments: left hemiparesis Restrictions Weight Bearing Restrictions: No Pain: Pain Assessment Pain Scale: 0-10 Pain Score: 0-No  pain   Therapy/Group: Individual Therapy  Valma Cava 06/17/2019, 12:19 PM

## 2019-06-17 NOTE — Progress Notes (Signed)
Speech Language Pathology Weekly Progress and Session Note  Patient Details  Name: Tammy Nunez MRN: [id number] Date of Birth: [birth year]  Beginning of progress report period: June 11, 2019 End of progress report period: June 17, 2019  Today's Date: 06/17/2019 SLP Individual Time: 6837-2902 SLP Individual Time Calculation (min): 45 min  Short Term Goals: Week 1: SLP Short Term Goal 1 (Week 1): Patient will demonstrate efficient mastication with complete oral clearance with trials of regular textures of 2 sessions prior to upgrade. SLP Short Term Goal 1 - Progress (Week 1): Not met SLP Short Term Goal 2 (Week 1): Patient will demonstrate complex problem solving for familiar tasks with supervision verbal cues. SLP Short Term Goal 2 - Progress (Week 1): Not met SLP Short Term Goal 3 (Week 1): Patient will self-monitor and correct errors during functional tasks with supervision verbal cues. SLP Short Term Goal 3 - Progress (Week 1): Not met SLP Short Term Goal 4 (Week 1): Patient will scan to left field of enviornment during functional tasks with Mod A verbal cues. SLP Short Term Goal 4 - Progress (Week 1): Not met    New Short Term Goals: Week 2: SLP Short Term Goal 1 (Week 2): Patient will demonstrate efficient mastication with complete oral clearance with trials of regular textures of 2 sessions prior to upgrade. SLP Short Term Goal 2 (Week 2): Patient will demonstrate semi-complex problem solving for familiar tasks with Min A cues. SLP Short Term Goal 3 (Week 2): Patient will self-monitor and correct errors during functional tasks with Min A cues. SLP Short Term Goal 4 (Week 2): Patient will scan to left field of enviornment during functional tasks with Mod A verbal cues.  Weekly Progress Updates:    Pt has made slower than anticipated progress over this reporting period. Pt continues to require Mod A support to perform basic problem solving tasks d/t deficits in problem solving as  well as very prominent deficits in visual-perceptual abilities. Skilled ST continues to be required to target the above mentioned goals in order to increase pt's functional independence and reduce caregiver burden. Anticipate that pt will require Min A at discharge and LTGs have been downgraded to reflect this.    Intensity: Minumum of 1-2 x/day, 30 to 90 minutes Frequency: 3 to 5 out of 7 days Duration/Length of Stay: 2.5-3 weeks Treatment/Interventions: Cognitive remediation/compensation;Dysphagia/aspiration precaution training;Therapeutic Activities;Environmental controls;Cueing hierarchy;Functional tasks;Patient/family education;Internal/external aids   Daily Session  Skilled Therapeutic Interventions: Skilled treatment session focused on cognition goals. SLP facilitated session by providing Mod A cues to learn new basic card game (War). Specifically, pt required Mod A cues to problem solve which number was the higher of the two numbers. Pt able to scan to left of midline with visual anchor present. For example, when SLP placed card don table, pt able to place her card beside it. However, if SLP didn't place her card first, pt unable to locate left side of table. Pt left upright in bed, bed alarm on and all needs within reach. Continue per current plan of care.     General    Pain    Therapy/Group: Individual Therapy  Noemie Devivo 06/17/2019, 9:09 AM

## 2019-06-17 NOTE — Progress Notes (Signed)
Occupational Therapy Session Note  Patient Details  Name: Tammy Nunez MRN: [id number] Date of Birth: Mar 31, 1951  Today's Date: 06/17/2019 OT Individual Time: 1300-1415 OT Individual Time Calculation (min): 75 min    Short Term Goals: Week 1:  OT Short Term Goal 1 (Week 1): Pt will be able to sit to stand with min A during LB dressing. OT Short Term Goal 1 - Progress (Week 1): Met OT Short Term Goal 2 (Week 1): Pt will be able to transfer into shower with min A. OT Short Term Goal 2 - Progress (Week 1): Not met OT Short Term Goal 3 (Week 1): Pt will complete toilet transfer with min A. OT Short Term Goal 3 - Progress (Week 1): Not met OT Short Term Goal 4 (Week 1): Pt will be able to don shirt with min A. OT Short Term Goal 4 - Progress (Week 1): Not met OT Short Term Goal 5 (Week 1): Pt will be able to don pants with mod A. OT Short Term Goal 5 - Progress (Week 1): Met Week 2:  OT Short Term Goal 1 (Week 2): Pt will complete toilet transfer with min A. OT Short Term Goal 2 (Week 2): Pt will be able to don shirt with min A. OT Short Term Goal 3 (Week 2): Pt will complete shower transfer with mod assist stand pivot. OT Short Term Goal 4 (Week 2): Pt will scan left of midline for locating grooming items at the sink with no more than min questioning cueing. OT Short Term Goal 5 (Week 2): Pt will use the LUE for washing the right arm with mod assist during bathing tasks. Week 3:     Skilled Therapeutic Interventions/Progress Updates:    1:1 SElf care retraining at shower level. Pt performed stand pivot transfer w/c to toilet with mod A; requiring mod tactile cues for weight shifting to the right. Toileting with mod A (removed clothing). Pt ambulated with grab bar and mod A over to the shower- max cues for advancement and placement of left LE. Showered sit to stand on bench with focus on dynamic sitting balance and functional use of left hand as nondominant with min A and cues for  sequencing. Pt with more proximal movement then distal. Dressed in a night gown for the evening with focus on hemi dressing and visual attention to the left with max VC and mod A.  NMR: continued focus on NMR with left UE including grasp and release, supination/ pronation, isolated elbow flexion and extension with functional reach. Pt given yellow soft foam block. Pt can flex 5th, 4th, 3rd digit with grasping item- difficulty with movement in index and thumb.    Left sitting in the w/c. Therapy Documentation Precautions:  Precautions Precautions: Fall Precaution Comments: left hemiparesis Restrictions Weight Bearing Restrictions: No Pain:  no c/o pain  Therapy/Group: Individual Therapy  Willeen Cass [facility] 06/17/2019, 3:02 PM

## 2019-06-17 NOTE — Progress Notes (Signed)
Physical Therapy Weekly Progress Note  Patient Details  Name: Tammy Nunez MRN: [id number] Date of Birth: [birth year]  Beginning of progress report period: June 10, 2019 End of progress report period: June 17, 2019  Today's Date: 06/17/2019 PT Individual Time: 0920-1034 PT Individual Time Calculation (min): 74 min   Patient has met 2 of 4 short term goals. Patient progressing well with therapy and demonstrating improving trunk/postural control/balance, increasing L LE strength, and improving functional mobility. Pt performs bed mobility with min assist, sit<>stand transfers using RW with min assist, bed<>chair transfers using RW with mod assist due to L lateral trunk lean and continued trunk control/balance impairments. Progressed to ambulating 45f using RW with +2 mod assist for balance and AD management. Patient continues to demonstrates significant impairments in L LE proprioception/awareness and L inattention requiring increased assistance for certain mobility tasks.  Patient continues to demonstrate the following deficits muscle weakness, decreased cardiorespiratoy endurance, impaired timing and sequencing, abnormal tone, unbalanced muscle activation, decreased coordination and decreased motor planning, decreased midline orientation and decreased attention to left and decreased sitting balance, decreased standing balance, decreased postural control and decreased balance strategies and therefore will continue to benefit from skilled PT intervention to increase functional independence with mobility.  Patient progressing toward long term goals.. Continue plan of care.  PT Short Term Goals Week 1:  PT Short Term Goal 1 (Week 1): Pt will perform supine<>sit with mod assist PT Short Term Goal 1 - Progress (Week 1): Met PT Short Term Goal 2 (Week 1): Pt will perform sit<>stand with min assist PT Short Term Goal 2 - Progress (Week 1): Met PT Short Term Goal 3 (Week 1): Pt will perform  bed<>chair transfers with min assist PT Short Term Goal 3 - Progress (Week 1): Progressing toward goal PT Short Term Goal 4 (Week 1): Pt will ambulate 383fusing LRAD with max assist PT Short Term Goal 4 - Progress (Week 1): Progressing toward goal Week 2:  PT Short Term Goal 1 (Week 2): Pt will perform supine<>sit with CGA PT Short Term Goal 2 (Week 2): Pt will perform bed<>chair transfers with min assist PT Short Term Goal 3 (Week 2): Pt will perform sit<>stands using LRAD with CGA PT Short Term Goal 4 (Week 2): Pt will ambulate 3060fsing RW with max assist  Skilled Therapeutic Interventions/Progress Updates:  Ambulation/gait training;Community reintegration;DME/adaptive equipment instruction;Neuromuscular re-education;Psychosocial support;Stair training;UE/LE Strength taining/ROM;Wheelchair propulsion/positioning;Balance/vestibular training;Discharge planning;Functional electrical stimulation;Pain management;Skin care/wound management;Therapeutic Activities;UE/LE Coordination activities;Cognitive remediation/compensation;Functional mobility training;Disease management/prevention;Patient/family education;Splinting/orthotics;Therapeutic Exercise;Visual/perceptual remediation/compensation   Pt received sitting in w/c and agreeable to therapy session. Transported to/from gym in w/c. Sit<>stands with CGA and max cuing for set-up and hand placement for safety with AD - upon coming to standing educated pt on fastening L hand in orthotic with min assist for balance without UE support. Ambulated [id number] R HHA and +2 mod assist for balance/trunk control due to L lateral lean, L LE placement during swing due to adduction and guarding knee during stance but no buckling noted. Donned ACE wrap on  L LE to assist with eversion and ankle DF. Ambulated [id number] RW with +2 mod assist of 1 and varying min/mod assist of 2nd person for AD management and trunk control espeically increased assist when turning to sit -  max cuing for sequencing of AD management and LE stepping especially during turning to sit - pt demonstrates improved L LE swing phase mechanics compared to without ACE wrap and with R HHA. Stand pivot EOM>w/c  using RW with +2 mod assist for AD management and balance/trunk control when turning to sit - max cuing for sequencing. Stepping up/down 6" step x4 reps using B HRs (but pt unable to sustain L UE grasp on rail) with +2 max progressed to +2 mod assist for weight shifting and balance with max cuing for sequencing of up with R LE and down with L LE. Performed seated repeated L LE foot taps on 6" step x20 repetitions. Stand pivot w/c>EOM using RW with mod assist of 1 for trunk control/balance and AD management with max cuing for sequencing of steps and AD. Sit>supine with min assist for L hemibody management. Supine>prone>quadruped with max assist +2 for getting into quadruped for L hemibody management. Tolerated quadruped position focusing on L UE weight bearing, core activation/trunk control, and hip control. Quadruped>supine with +2 mod assist. Supine isometric adduction squeeze progressed to performing during bridge 3x5 repetitions - cuing for proper technique/form. Supine>sit with min assist for L LE management and trunk upright with mod cuing for sequencing. Repeated L LE step up/down on 2" step with B UE support on RW and mod assist for balance with max multimodal cuing for L LE coordination of movement to prevent hip adduction. Stand pivot EOM>w/c using RW with mod assist of 1 and max cuing. Transported back to room in w/c and left sitting in w/c with needs in reach and seat belt alarm on.  Therapy Documentation Precautions:  Precautions Precautions: Fall Precaution Comments: left hemiparesis Restrictions Weight Bearing Restrictions: No  Pain: Denies pain during session.  Therapy/Group: Individual Therapy  Tawana Scale, PT, DPT 06/17/2019, 7:52 AM

## 2019-06-17 NOTE — Plan of Care (Signed)
  Problem: RH Problem Solving Goal: LTG Patient will demonstrate problem solving for (SLP) Description: LTG:  Patient will demonstrate problem solving for basic/complex daily situations with cues  (SLP) Flowsheets (Taken 06/17/2019 1153) LTG: Patient will demonstrate problem solving for (SLP): (Semi-complex) -- LTG Patient will demonstrate problem solving for: Minimal Assistance - Patient > 75% Note: Downgraded d/t lack of progress towards goals   Problem: RH Awareness Goal: LTG: Patient will demonstrate awareness during functional activites type of (SLP) Description: LTG: Patient will demonstrate awareness during functional activites type of (SLP) Flowsheets (Taken 06/17/2019 1153) LTG: Patient will demonstrate awareness during cognitive/linguistic activities with assistance of (SLP): Minimal Assistance - Patient > 75% Note: Downgraded d/t lack of progress towards goals   Problem: RH Pre-functional/Other (Specify) Goal: RH LTG SLP (Specify) 1 Description: RH LTG SLP (Specify) 1 Flowsheets (Taken 06/17/2019 1153) LTG: Other SLP (Specify) 1: Pt will utilize compensatory strategies to attend/scan to left field of environment during functional tasks with Min A cues. Note: Downgraded d/t lack of progress

## 2019-06-17 NOTE — Progress Notes (Addendum)
[facility] PHYSICAL MEDICINE & REHABILITATION PROGRESS NOTE   Subjective/Complaints:  No issues overnite, remains incontinent, had some diarrhea last night ROS- neg CP, SOB, N/V  Objective:   No results found. No results for input(s): WBC, HGB, HCT, PLT in the last 72 hours. No results for input(s): NA, K, CL, CO2, GLUCOSE, BUN, CREATININE, CALCIUM in the last 72 hours.  Intake/Output Summary (Last 24 hours) at 06/17/2019 0916 Last data filed at 06/16/2019 1900 Gross per 24 hour  Intake 360 ml  Output -  Net 360 ml     Physical Exam: Vital Signs Blood pressure (!) 140/59, pulse 78, temperature 97.7 F (36.5 C), temperature source Oral, resp. rate 14, height 5\' 2"  (1.575 m), weight 77 kg, SpO2 98 %.  General: No acute distress HEENT R upper lip sutures intact well approximated vermillion border with mild swelling  Mood and affect are appropriate Heart: Regular rate and rhythm no rubs murmurs or extra sounds Lungs: Clear to auscultation, breathing unlabored, no rales or wheezes Abdomen: Positive bowel sounds, soft nontender to palpation, nondistended Extremities: No clubbing, cyanosis, or edema Skin: No evidence of breakdown, no evidence of rash Neurologic: Cranial nerves II through XII intact, motor strength is 2-/5 in left , bicep, tricep,2- finger flexors , 2- hip flexor, knee extensors, ankle dorsiflexor and plantar flexor  Musculoskeletal: Full range of motion in all 4 extremities. No joint swelling    Assessment/Plan: 1. Functional deficits secondary to Left Hemi R MCA which require 3+ hours per day of interdisciplinary therapy in a comprehensive inpatient rehab setting.  Physiatrist is providing close team supervision and 24 hour management of active medical problems listed below.  Physiatrist and rehab team continue to assess barriers to discharge/monitor patient progress toward functional and medical goals  Care Tool:  Bathing    Body parts bathed by patient:  Left arm, Chest, Abdomen, Front perineal area, Right upper leg, Left upper leg, Right lower leg, Face   Body parts bathed by helper: Right arm, Left lower leg, Buttocks     Bathing assist Assist Level: Moderate Assistance - Patient 50 - 74%     Upper Body Dressing/Undressing Upper body dressing Upper body dressing/undressing activity did not occur (including orthotics): Safety/medical concerns What is the patient wearing?: Pull over shirt    Upper body assist Assist Level: Maximal Assistance - Patient 25 - 49%    Lower Body Dressing/Undressing Lower body dressing      What is the patient wearing?: Incontinence brief, Pants     Lower body assist Assist for lower body dressing: Maximal Assistance - Patient 25 - 49%     Toileting Toileting    Toileting assist Assist for toileting: Maximal Assistance - Patient 25 - 49%     Transfers Chair/bed transfer  Transfers assist     Chair/bed transfer assist level: Moderate Assistance - Patient 50 - 74%     Locomotion Ambulation   Ambulation assist      Assist level: 2 helpers Assistive device: Other (comment)(3 Musketeer) Max distance: 68ft   Walk 10 feet activity   Assist     Assist level: 2 helpers Assistive device: Other (comment)(3 Musketeers)   Walk 50 feet activity   Assist Walk 50 feet with 2 turns activity did not occur: Safety/medical concerns         Walk 150 feet activity   Assist Walk 150 feet activity did not occur: Safety/medical concerns         Walk 10 feet on  uneven surface  activity   Assist Walk 10 feet on uneven surfaces activity did not occur: Safety/medical concerns         Wheelchair     Assist Will patient use wheelchair at discharge?: (TBD) Type of Wheelchair: Manual    Wheelchair assist level: Minimal Assistance - Patient > 75% Max wheelchair distance: 50 ft    Wheelchair 50 feet with 2 turns activity    Assist        Assist Level: Minimal  Assistance - Patient > 75%   Wheelchair 150 feet activity     Assist     Assist Level: Minimal Assistance - Patient > 75%    Medical Problem List and Plan: 1.Functional deficits and left hemiparesissecondary to right MCA distribution embolic infarcts CIR PT, OT, SLP, progressing overall well but has difficulties related to left upper extremity and left lower extremity proprioceptive deficits 2. Antithrombotics: -DVT/anticoagulation:Pharmaceutical:Lovenox -antiplatelet therapy: ASA/Plavix 3. Pain Management:Tylenol prn 4. Mood:LCSW to follow for evaluation and support. -antipsychotic agents: N/A 5. Neuropsych: This patientiscapable of making decisions on herown behalf. 6. Skin/Wound Care:Routine pressure relief measuresRIght Upper lip   laceration sutures out 7. Fluids/Electrolytes/Nutrition:Monitor I/O. -I personally reviewed the patient's labs today. -replace potassium (3.0) 8. T2DM: New diagnosis with Hgb A1c-9.3. Educate on Carb modified diet. Change Lantus/novolog to low dose metformin titrate upwardsas indicated. Will continue to use sliding scale insulin for elevated blood sugars. CBG (last 3)  Recent Labs    06/16/19 1713 06/16/19 2159 06/17/19 0625  GLUCAP 137* 142* 148*  will reduce to 250mg  and monitor  Side effect of diarrhea 9. Bilateral carotid artery stenosis:Follow-up with vascular surgery after discharge 10. New onsetsimple partialseizures: Continue Keppra 1000 mg bid. 11. Morbid obesity: BMI31.0--educated patient on importance of weight loss to help with overall health and mobility. 12. HTN: Monitor BP tid--avoid hypotension.  Vitals:   06/16/19 1946 06/17/19 0459  BP: (!) 158/63 (!) 140/59  Pulse: 85 78  Resp: 18 14  Temp: 98 F (36.7 C) 97.7 F (36.5 C)  SpO2: 97% 98%  Will increase Zestril to 20 mg/day, blood pressure starting to respond 13. Tobacco use: Continue  to educate on importance of cessation.  14. Dyslipidemia:Continue statin #15 hypokalemia resolved on oral KCl cont KCL 57meq BID, repeat basic metabolic package on Monday 7/27 16.  Loose stools need to reduce metformin dose  LOS: 8 days A FACE TO FACE EVALUATION WAS PERFORMED  Tammy Nunez 06/17/2019, 9:16 AM

## 2019-06-18 ENCOUNTER — Inpatient Hospital Stay (HOSPITAL_COMMUNITY): Payer: PPO | Admitting: Occupational Therapy

## 2019-06-18 LAB — GLUCOSE, CAPILLARY
Glucose-Capillary: 148 mg/dL — ABNORMAL HIGH (ref 70–99)
Glucose-Capillary: 126 mg/dL — ABNORMAL HIGH (ref 70–99)
Glucose-Capillary: 130 mg/dL — ABNORMAL HIGH (ref 70–99)
Glucose-Capillary: 150 mg/dL — ABNORMAL HIGH (ref 70–99)

## 2019-06-18 NOTE — Patient Care Conference (Signed)
Inpatient RehabilitationTeam Conference and Plan of Care Update Date: 06/15/2019   Time: 11:25 AM    Patient Name: Tammy Nunez      Medical Record Number: [id number]  Date of Birth: 07/18/1951 Sex: Female         Room/Bed: 4W19C/4W19C-01 Payor Info: Payor: [employer] / Plan: [employer] PPO / Product Type: *No Product type* /    Admitting Diagnosis: 4. CVA 2 Team  RT. CVA; 16-19days  Admit Date/Time:  06/09/2019  2:32 PM Admission Comments: No comment available   Primary Diagnosis:  <principal problem not specified> Principal Problem: <principal problem not specified>  Patient Active Problem List   Diagnosis Date Noted  . Acute ischemic right MCA stroke ([place]) 06/09/2019  . Acute CVA (cerebrovascular accident) ([place]) 06/06/2019    Expected Discharge Date: Expected Discharge Date: (re-eval next week for d/c date - around 3 weeks)  Team Members Present: Physician leading conference: Dr. Alysia Penna Social Worker Present: Alfonse Alpers, LCSW Nurse Present: Isla Pence, RN PT Present: Michaelene Song, PT OT Present: Clyda Greener, OT SLP Present: Stormy Fabian, SLP PPS Coordinator present : Gunnar Fusi, SLP     Current Status/Progress Goal Weekly Team Focus  Medical   Severe left upper extremity weakness persists good return in the left lower extremity.  Blood pressure with occasional systolic elevation  Maintain medical stability, reduce recurrent stroke risk reduce fall risk  May need to tighten blood pressure control this week   Bowel/Bladder   Pt is currently continent of B/B, LMB 7/22. Pt has had a episide of incontinence with bowel the last 2 nights, both consisted of loose stool.  Remain continent of B/B, with normal bowel function.  Q2h/PRN toileting   Swallow/Nutrition/ Hydration   dys 3 and thin, missing dention  Mod I  trials of regular for soild upgrade   ADL's   Min assist for UB bathing with mod assist for LB bathing.  Mod assist for  UB dressing with max assist for LB dressing.  Squat pivot transfers min to mod depending on direction.  Max assist for stand pivot transfers to the toilet.  Left neglect with field cut as well as decreased visual processing.  LUE Brunnstrum stage III-IV in the arm and hand.  Supervision currently  selfcare retraining, neuromuscular re-education, balance retraining, therapeutic exercise, transfer training, pt/family education   Mobility   min-mod assist bed mobility, min assist stand pivot, min-mod assist standing balance, mod-max assist gait 10 ft with RW  supervision-CGA  balance, LE strength, attention, L neuro re-ed, awareness, transfers, gait   Communication             Safety/Cognition/ Behavioral Observations  Max-Mod A basic  Mod I - might be downgraded following reassessment  cognitive reassessment, semi-comples/basic problem solving, recall, emergent awareness and left scaning   Pain   Pt has no complaints of pain.  Pt will remain pain free.  Assess pain Qshift/ PRN   Skin   Pt has no obvious signs of skin breakdown. Pt has sutures to R upper lip. clean and dry,.  Pt will refrain from skin breakdown and futher infection.  Assess skin Qshift/ PRN    Rehab Goals Patient on target to meet rehab goals: Yes Rehab Goals Revised: Some of pt's goals were downgraded to min A. *See Care Plan and progress notes for long and short-term goals.     Barriers to Discharge  Current Status/Progress Possible Resolutions Date Resolved   Physician    Medical  stability     Progressing towards goals  See above      Nursing                  PT                    OT                  SLP                SW                Discharge Planning/Teaching Needs:  Pt plans to return home with her husband and other family to assist as needed.  Husband to come for family education closer to d/c.   Team Discussion:  Pt is doing great medically.  Dr. Letta Pate stated pt's blood pressure is a little high  and may need to work on that, but it's not anything that is limiting therapy.  Pt is having diarrhea with metformin and this is working for blood sugars, but he may need to decrease it if diarrhea persists.  Pt is incontinent of stool as a result.  Pt will have sutures in upper lip removed.  Pt has MASD to peri and groin area.  She c/o of L shoulder pain.  Pt is getting some trace digit flexion in left hand.  She has a field cut and OT thinks goals may need to be downgraded to min A.  Pt has great strength on impacted side, but her proprioception is not there and everything falls apart when gait is attempted.  Pt has trouble coordinating movements.  PT has S/CGA goals but may need to downgrade them.  Pt has impairments in memory, problem solving, and inattention.  Revisions to Treatment Plan:  none    Continued Need for Acute Rehabilitation Level of Care: The patient requires daily medical management by a physician with specialized training in physical medicine and rehabilitation for the following conditions: Daily direction of a multidisciplinary physical rehabilitation program to ensure safe treatment while eliciting the highest outcome that is of practical value to the patient.: Yes Daily medical management of patient stability for increased activity during participation in an intensive rehabilitation regime.: Yes Daily analysis of laboratory values and/or radiology reports with any subsequent need for medication adjustment of medical intervention for : Neurological problems;Blood pressure problems   I attest that I was present, lead the team conference, and concur with the assessment and plan of the team.Team conference was held via web/ teleconference due to [place] - 19.    Chayse Zatarain, Silvestre Mesi 06/18/2019, 12:47 AM

## 2019-06-18 NOTE — Progress Notes (Signed)
Occupational Therapy Session Note  Patient Details  Name: Tammy Nunez MRN: [id number] Date of Birth: 06-19-1951  Today's Date: 06/18/2019 OT Individual Time: 8469-6295 OT Individual Time Calculation (min): 72 min    Short Term Goals: Week 2:  OT Short Term Goal 1 (Week 2): Pt will complete toilet transfer with min A. OT Short Term Goal 2 (Week 2): Pt will be able to don shirt with min A. OT Short Term Goal 3 (Week 2): Pt will complete shower transfer with mod assist stand pivot. OT Short Term Goal 4 (Week 2): Pt will scan left of midline for locating grooming items at the sink with no more than min questioning cueing. OT Short Term Goal 5 (Week 2): Pt will use the LUE for washing the right arm with mod assist during bathing tasks.  Skilled Therapeutic Interventions/Progress Updates:    Pt completed bathing and dressing during session.  Mod assist for stand pivot transfer from bed to the wheelchair and to the shower bench.  Increased difficulty processing the direction she needed to turn in the bathroom to take the shortest route to the shower bench.  Once on the seat she was able to remove all dirty clothing and brief with mod assist.  She completed sit to stand with min assist.  Wash mitt was integrated with max hand over hand assist when using the LUE for bathing.  With mod assist for pt to stand in the shower for washing her peri area and buttocks.  Dressing completed at the sink with mod assist for both donning pullover shirt as well as brief and pants.  Max demonstrational cueing for orientation of clothing.  She was able to donn gripper socks as well with mod assist following one handed technique.  Finished session with pt in the wheelchair with call button and phone in reach and safety alarm belt in place.    Therapy Documentation Precautions:  Precautions Precautions: Fall Precaution Comments: left hemiparesis Restrictions Weight Bearing Restrictions: No  Pain: Pain  Assessment Pain Scale: Faces Pain Score: 0-No pain ADL: See Care Tool Section for some details of ADL  Therapy/Group: Individual Therapy  , OTR/L 06/18/2019, 3:56 PM

## 2019-06-18 NOTE — Progress Notes (Signed)
Tammy Nunez is a 68 y.o. female admitted for CIR with functional deficits and left hemiparesis secondary to right MCA embolic infarcts  Past Medical History:  Diagnosis Date  . Chronic left shoulder pain      Subjective: No new complaints. No new problems. Slept well.   Objective: Vital signs in last 24 hours: Temp:  [97.8 F (36.6 C)-98.5 F (36.9 C)] 97.8 F (36.6 C) (07/25 0503) Pulse Rate:  [83-92] 83 (07/25 0503) Resp:  [16] 16 (07/25 0503) BP: (137-147)/(63-72) 137/71 (07/25 0503) SpO2:  [96 %-100 %] 100 % (07/25 0503) Weight change:  Last BM Date: 06/16/19  Intake/Output from previous day: 07/24 0701 - 07/25 0700 In: 120 [P.O.:120] Out: -  Last cbgs: CBG (last 3)  Recent Labs    06/17/19 1701 06/17/19 2117 06/18/19 0627  GLUCAP 110* 141* 148*   Patient Vitals for the past 24 hrs:  BP Temp Temp src Pulse Resp SpO2  06/18/19 0503 137/71 97.8 F (36.6 C) - 83 16 100 %  06/17/19 1950 (!) 145/63 98.4 F (36.9 C) Oral 84 16 96 %  06/17/19 1416 (!) 147/72 98.5 F (36.9 C) - 92 16 100 %     Physical Exam General: No apparent distress obese HEENT: not dry Lungs: Normal effort. Lungs clear to auscultation, no crackles or wheezes. Cardiovascular: Regular rate and rhythm, no edema Abdomen: S/NT/ND; BS(+) Musculoskeletal:  unchanged Neurological: No new neurological deficits  Wounds: N/A    Skin: clear  Aging changes Mental state: Alert, oriented, cooperative    Lab Results: BMET    Component Value Date/Time   NA 140 06/13/2019 0729   K 4.5 06/13/2019 0729   CL 111 06/13/2019 0729   CO2 20 (L) 06/13/2019 0729   GLUCOSE 157 (H) 06/13/2019 0729   BUN 16 06/13/2019 0729   CREATININE 1.06 (H) 06/13/2019 0729   CALCIUM 9.6 06/13/2019 0729   GFRNONAA 54 (L) 06/13/2019 0729   GFRAA >60 06/13/2019 0729   CBC    Component Value Date/Time   WBC 6.3 06/13/2019 0729   RBC 5.54 (H) 06/13/2019 0729   HGB 17.1 (H) 06/13/2019 0729   HCT 50.3 (H)  06/13/2019 0729   PLT 163 06/13/2019 0729   MCV 90.8 06/13/2019 0729   MCH 30.9 06/13/2019 0729   MCHC 34.0 06/13/2019 0729   RDW 12.9 06/13/2019 0729   LYMPHSABS 3.2 06/09/2019 1455   MONOABS 0.9 06/09/2019 1455   EOSABS 0.1 06/09/2019 1455   BASOSABS 0.0 06/09/2019 1455     Medications: I have reviewed the patient's current medications.  Assessment/Plan:  Functional deficits following right MCA embolic infarction with left hemiparesis.  Continue CIR.  Continue DAPT DVT prophylaxis.  Continue Lovenox T2 DM.  Reasonable glycemic control Seizure disorder.  Continue Keppra Essential hypertension stable.  No change in therapy  Length of stay, days: 9  Marletta Lor , MD 06/18/2019, 10:42 AM

## 2019-06-18 NOTE — Progress Notes (Signed)
Social Work Patient ID: Tammy Nunez, female   DOB: [birth year], 68 y.o.   MRN: [id number]   CSW met with pt and called her husband to update them on team conference discussion and that team felt they needed another week with pt before they can set a d/c date.  They expect pt to need around 3 weeks of CIR, but did not feel prepared to set date as they are still setting goals and seeing where they can get pt to.  Pt/husband expressed understanding and are just grateful pt could come to CIR.  CSW will continue to follow and assist as needed.

## 2019-06-19 ENCOUNTER — Inpatient Hospital Stay (HOSPITAL_COMMUNITY): Payer: PPO | Admitting: Physical Therapy

## 2019-06-19 LAB — GLUCOSE, CAPILLARY
Glucose-Capillary: 150 mg/dL — ABNORMAL HIGH (ref 70–99)
Glucose-Capillary: 159 mg/dL — ABNORMAL HIGH (ref 70–99)
Glucose-Capillary: 147 mg/dL — ABNORMAL HIGH (ref 70–99)
Glucose-Capillary: 181 mg/dL — ABNORMAL HIGH (ref 70–99)

## 2019-06-19 NOTE — Progress Notes (Signed)
Physical Therapy Session Note  Patient Details  Name: Tammy Nunez MRN: [id number] Date of Birth: [birth year]  Today's Date: 06/19/2019 PT Individual Time: 0907-1005 PT Individual Time Calculation (min): 58 min   Short Term Goals: Week 2:  PT Short Term Goal 1 (Week 2): Pt will perform supine<>sit with CGA PT Short Term Goal 2 (Week 2): Pt will perform bed<>chair transfers with min assist PT Short Term Goal 3 (Week 2): Pt will perform sit<>stands using LRAD with CGA PT Short Term Goal 4 (Week 2): Pt will ambulate 56ft using RW with max assist  Skilled Therapeutic Interventions/Progress Updates:   Pt received supine in bed reporting she is having diarrhea again today - therapist recommended going to toilet and pt agreeable. Supine>sit, HOB elevated and using bedrails, with mod assist for trunk upright and mod cuing for L LE to EOB and sequencing. Stand pivot transfer EOB>w/c with mod assist for lifting, pivoting, and balance while turning with pt initially reaching across her body on L for the w/c requiring cuing for proper sequencing and safety awareness. Transported in w/c to/from bathroom. Stand pivot w/c>toilet with mod assist for lifting, pivoting, and balance due to L lateral lean. Min/mod assist for balance during total assist LB clothing management. Pt continent of bladder and bowels on toilet - noted to have diarrhea with MD notified upon his arrival for assessment. Sit>stand toilet>grab bar with mod assist for lifting/balance - static standing balance with min assist during total assist peri-care and max assist LB clothing management. Stand pivot to w/c with mod assist for balance and lowering. Transported to/from gym in w/c. Stand pivot to/from EOM, no AD, with min/mod assist for balance due to L lateral lean - mod cuing for sequencing of LE stepping. Static standing with and without RW using mirror feedback focusing on midline orientation with min/mod assist for balance.   Participated  in L LE NMR stepping up/down on 4" step using B UE support on RW with min/mod assist for balance - mirror feedback and external target taped on step/ground for improved motor control/coordination of L LE. Repeated R LE stepping up/down on 4" step with B UE support on RW, mirror feedback for midline orientation, and mod/max assist due to pt having L lateral LOB with minimal recovery/awareness - transitioned to perform stepping forward/backwards with R LE on ground with continued mod assist for balance and focus on adequate L LE hip abductor, extensor and knee extensor activation to maintain balance on L LE stance limb. Transported back to room and left sitting in w/c with needs in reach and seat belt alarm on.   Therapy Documentation Precautions:  Precautions Precautions: Fall Precaution Comments: left hemiparesis Restrictions Weight Bearing Restrictions: No  Pain: Denies pain during session.   Therapy/Group: Individual Therapy  Tawana Scale, PT, DPT 06/19/2019, 7:47 AM

## 2019-06-19 NOTE — Progress Notes (Signed)
Tammy Nunez is a 68 y.o. female who is admitted for CIR with functional deficits with left hemiparesis following right MCA embolic infarction  Past Medical History:  Diagnosis Date  . Chronic left shoulder pain      Subjective: No new complaints except for some diarrhea today following laxative use yesterday.  Otherwise doing quite well  Objective: Vital signs in last 24 hours: Temp:  [97.7 F (36.5 C)-98.2 F (36.8 C)] 97.7 F (36.5 C) (07/26 0559) Pulse Rate:  [78-86] 78 (07/26 0559) Resp:  [15-18] 16 (07/26 0559) BP: (141-155)/(63-114) 147/78 (07/26 0559) SpO2:  [96 %-98 %] 98 % (07/26 0559) Weight change:  Last BM Date: 06/16/19  Intake/Output from previous day: 07/25 0701 - 07/26 0700 In: 340 [P.O.:340] Out: -  Last cbgs: CBG (last 3)  Recent Labs    06/18/19 1623 06/18/19 2204 06/19/19 0625  GLUCAP 130* 150* 150*   Patient Vitals for the past 24 hrs:  BP Temp Temp src Pulse Resp SpO2  06/19/19 0559 (!) 147/78 97.7 F (36.5 C) - 78 16 98 %  06/19/19 0515 (!) 150/63 97.8 F (36.6 C) Oral 80 15 96 %  06/18/19 1924 (!) 155/68 - - 82 16 97 %  06/18/19 1342 (!) 141/66 - - 82 - -  06/18/19 1310 (!) 145/114 98.2 F (36.8 C) - 86 18 98 %     Physical Exam General: No apparent distress obese HEENT: Mucous membranes moist Lungs: Normal effort. Lungs clear to auscultation, no crackles or wheezes. Cardiovascular: Regular rate and rhythm, no edema Abdomen: S/NT/ND; BS(+) Musculoskeletal:  unchanged Neurological: No new neurological deficits Wounds: N/A    Skin: clear  Mental state: Alert, oriented, cooperative    Lab Results: BMET    Component Value Date/Time   NA 140 06/13/2019 0729   K 4.5 06/13/2019 0729   CL 111 06/13/2019 0729   CO2 20 (L) 06/13/2019 0729   GLUCOSE 157 (H) 06/13/2019 0729   BUN 16 06/13/2019 0729   CREATININE 1.06 (H) 06/13/2019 0729   CALCIUM 9.6 06/13/2019 0729   GFRNONAA 54 (L) 06/13/2019 0729   GFRAA >60 06/13/2019  0729   CBC    Component Value Date/Time   WBC 6.3 06/13/2019 0729   RBC 5.54 (H) 06/13/2019 0729   HGB 17.1 (H) 06/13/2019 0729   HCT 50.3 (H) 06/13/2019 0729   PLT 163 06/13/2019 0729   MCV 90.8 06/13/2019 0729   MCH 30.9 06/13/2019 0729   MCHC 34.0 06/13/2019 0729   RDW 12.9 06/13/2019 0729   LYMPHSABS 3.2 06/09/2019 1455   MONOABS 0.9 06/09/2019 1455   EOSABS 0.1 06/09/2019 1455   BASOSABS 0.0 06/09/2019 1455     Medications: I have reviewed the patient's current medications.  Assessment/Plan:  Functional deficits following right MCA embolic infarction with left hemiparesis.  Continue CIR.  Continue DAPT Essential hypertension.  Well-controlled.  No change in therapy DVT prophylaxis continue Lovenox T2DM.  Remains stable.  No change in therapy Seizure disorder.  Remains stable continue Keppra    Length of stay, days: 10  Marletta Lor , MD 06/19/2019, 9:54 AM

## 2019-06-20 ENCOUNTER — Inpatient Hospital Stay (HOSPITAL_COMMUNITY): Payer: PPO

## 2019-06-20 ENCOUNTER — Inpatient Hospital Stay (HOSPITAL_COMMUNITY): Payer: PPO | Admitting: Occupational Therapy

## 2019-06-20 LAB — GLUCOSE, CAPILLARY
Glucose-Capillary: 157 mg/dL — ABNORMAL HIGH (ref 70–99)
Glucose-Capillary: 148 mg/dL — ABNORMAL HIGH (ref 70–99)
Glucose-Capillary: 155 mg/dL — ABNORMAL HIGH (ref 70–99)
Glucose-Capillary: 123 mg/dL — ABNORMAL HIGH (ref 70–99)

## 2019-06-20 LAB — BASIC METABOLIC PANEL
Anion gap: 12 (ref 5–15)
BUN: 21 mg/dL (ref 8–23)
CO2: 16 mmol/L — ABNORMAL LOW (ref 22–32)
Calcium: 10 mg/dL (ref 8.9–10.3)
Chloride: 110 mmol/L (ref 98–111)
Creatinine, Ser: 1.08 mg/dL — ABNORMAL HIGH (ref 0.44–1.00)
GFR calc Af Amer: >60 mL/min (ref >60)
GFR calc non Af Amer: 53 mL/min — ABNORMAL LOW (ref >60)
Glucose, Bld: 158 mg/dL — ABNORMAL HIGH (ref 70–99)
Potassium: 5.5 mmol/L — ABNORMAL HIGH (ref 3.5–5.1)
Sodium: 138 mmol/L (ref 135–145)

## 2019-06-20 LAB — CBC
HCT: 48.3 % — ABNORMAL HIGH (ref 36.0–46.0)
Hemoglobin: 15.8 g/dL — ABNORMAL HIGH (ref 12.0–15.0)
MCH: 31.4 pg (ref 26.0–34.0)
MCHC: 32.7 g/dL (ref 30.0–36.0)
MCV: 96 fL (ref 80.0–100.0)
Platelets: 229 10*3/uL (ref 150–400)
RBC: 5.03 MIL/uL (ref 3.87–5.11)
RDW: 13 % (ref 11.5–15.5)
WBC: 5.4 10*3/uL (ref 4.0–10.5)
nRBC: 0 % (ref 0.0–0.2)

## 2019-06-20 NOTE — Progress Notes (Signed)
Occupational Therapy Session Note  Patient Details  Name: Tammy Nunez MRN: [id number] Date of Birth: [birth year]  Today's Date: 06/20/2019 OT Individual Time: 2951-8841 OT Individual Time Calculation (min): 71 min    Short Term Goals: Week 2:  OT Short Term Goal 1 (Week 2): Pt will complete toilet transfer with min A. OT Short Term Goal 2 (Week 2): Pt will be able to don shirt with min A. OT Short Term Goal 3 (Week 2): Pt will complete shower transfer with mod assist stand pivot. OT Short Term Goal 4 (Week 2): Pt will scan left of midline for locating grooming items at the sink with no more than min questioning cueing. OT Short Term Goal 5 (Week 2): Pt will use the LUE for washing the right arm with mod assist during bathing tasks.  Skilled Therapeutic Interventions/Progress Updates:    Therapist took pt down to the tub/shower room where she practiced stand pivot transfers from the wheelchair to the tub bench using the RW for support.  Max demonstrational cueing with mod assist for pivot to the right to transfer onto the seat.  Max assist however when transferring to the left secondary to left field cut as well as left neglect.  Pt with decreased proprioception of where the LLE is moving as well.  Next had pt transfer to the therapy mat with mod assist and into supine position.  Worked on Clinical research associate as well as AAROM for left shoulder flexion and elbow flexion and extension.  She needs mod assist for shoulder flexion as she used a internal rotation and abduction pattern.  She also needs mod instructional cueing to maintain sustained visual attention when attempting to move the LUE.  Still with decreased digit flexion and extension overall.  She exhibits 75% for digit flexion and less than 10% for digit extension.  Worked on bilateral shoulder flexion with use of hula hoop in supine and ace bandage wrapped over the left hand in order to maintain grip.  Pt with increased  difficulty coordinating both right and left hands for use.  Mod assist for shoulder flexion but she could complete 90 degrees shoulder flexion and maintain with min assist.  Finished neuromuscular re-education in sitting with use of the UE Ranger.  She worked on active shoulder flexion and elbow extension to target with mod assist to avoid trunk compensation patterns.  Also worked on functional reach to pick up a cup from the rolling stool with max facilitation for digit extension and mod assist for elbow extension and shoulder flexion.  Returned to the room via wheelchair with pt staying up in the chair with call button and phone in reach and alarm belt in place.    Therapy Documentation Precautions:  Precautions Precautions: Fall Precaution Comments: left hemiparesis Restrictions Weight Bearing Restrictions: No  Pain: Pain Assessment Pain Scale: Faces Pain Score: 0-No pain ADL: See Care Tool Section for some details of mobility  Therapy/Group: Individual Therapy  Jocilynn Grade OTR/L 06/20/2019, 3:43 PM

## 2019-06-20 NOTE — Progress Notes (Signed)
Speech Language Pathology Daily Session Note  Patient Details  Name: Tammy Nunez MRN: [id number] Date of Birth: Mar 03, 1951  Today's Date: 06/20/2019 SLP Individual Time: 0803-0900 SLP Individual Time Calculation (min): 57 min  Short Term Goals: Week 2: SLP Short Term Goal 1 (Week 2): Patient will demonstrate efficient mastication with complete oral clearance with trials of regular textures of 2 sessions prior to upgrade. SLP Short Term Goal 2 (Week 2): Patient will demonstrate semi-complex problem solving for familiar tasks with Min A cues. SLP Short Term Goal 3 (Week 2): Patient will self-monitor and correct errors during functional tasks with Min A cues. SLP Short Term Goal 4 (Week 2): Patient will scan to left field of enviornment during functional tasks with Mod A verbal cues.  Skilled Therapeutic Interventions: Skilled ST services focused on cognitive skills. Pt had consumed breakfast piror to SLP entering room, pt was able to recall items on left of tray after SLP removed tray. SLP facilitated basic problem solving and recall with novel card task (Blink) played at simplest level, pt required mod A verbal cues for problem solving/attention to task and min A verbal cues for recall of rules. Pt demonstrated the most difficulty in task idenitfy own cards verse partners cards and recalling what to do with the discard pile, picking up cards from discharge pile to match with her own, as well as overall delayed processing. SLP also facilitated sustained attention, recall and basic problem solving skills with card sorting task by shape in a field of 6, pt required mod A verbal cues to initially sort into 6 categorizes, however then demonstrate mod I for problem solving, recall and sustained attention once initial cards were in place. Pt required overall min A verbal cues to scan left during card tasks. Pt demonstrated recall of schedule following initial instruction with min A verbal cues. Pt was left  in room with call bell within reach and bed alarm set. ST recommends to continue skilled ST services.      Pain Pain Assessment Pain Score: 0-No pain  Therapy/Group: Individual Therapy    [facility] 06/20/2019, 2:40 PM

## 2019-06-20 NOTE — Progress Notes (Signed)
[place] PHYSICAL MEDICINE & REHABILITATION PROGRESS NOTE   Subjective/Complaints:  No issues overnite , correctly identifies rules of activity she is doing with SLP   ROS- neg CP, SOB, N/V  Objective:   No results found. Recent Labs    06/20/19 0732  WBC 5.4  HGB 15.8*  HCT 48.3*  PLT 229   Recent Labs    06/20/19 0732  NA 138  K 5.5*  CL 110  CO2 16*  GLUCOSE 158*  BUN 21  CREATININE 1.08*  CALCIUM 10.0    Intake/Output Summary (Last 24 hours) at 06/20/2019 9326 Last data filed at 06/19/2019 1839 Gross per 24 hour  Intake 340 ml  Output -  Net 340 ml     Physical Exam: Vital Signs Blood pressure (!) 147/65, pulse 77, temperature 97.8 F (36.6 C), resp. rate 16, height 5\' 2"  (1.575 m), weight 77 kg, SpO2 97 %.  General: No acute distress HEENT R upper lip sutures intact well approximated vermillion border with mild swelling  Mood and affect are appropriate Heart: Regular rate and rhythm no rubs murmurs or extra sounds Lungs: Clear to auscultation, breathing unlabored, no rales or wheezes Abdomen: Positive bowel sounds, soft nontender to palpation, nondistended Extremities: No clubbing, cyanosis, or edema Skin: No evidence of breakdown, no evidence of rash Neurologic: Cranial nerves II through XII intact, motor strength is 2-/5 in left , bicep, tricep,2- finger flexors , 2- hip flexor, knee extensors, ankle dorsiflexor and plantar flexor  Musculoskeletal: Full range of motion in all 4 extremities. No joint swelling    Assessment/Plan: 1. Functional deficits secondary to Left Hemi R MCA which require 3+ hours per day of interdisciplinary therapy in a comprehensive inpatient rehab setting.  Physiatrist is providing close team supervision and 24 hour management of active medical problems listed below.  Physiatrist and rehab team continue to assess barriers to discharge/monitor patient progress toward functional and medical goals  Care  Tool:  Bathing    Body parts bathed by patient: Left arm, Chest, Abdomen, Front perineal area, Right upper leg, Left upper leg, Right lower leg, Face   Body parts bathed by helper: Left lower leg, Buttocks, Right arm     Bathing assist Assist Level: Moderate Assistance - Patient 50 - 74%     Upper Body Dressing/Undressing Upper body dressing Upper body dressing/undressing activity did not occur (including orthotics): Safety/medical concerns What is the patient wearing?: Pull over shirt    Upper body assist Assist Level: Moderate Assistance - Patient 50 - 74%    Lower Body Dressing/Undressing Lower body dressing      What is the patient wearing?: Pants, Incontinence brief     Lower body assist Assist for lower body dressing: Moderate Assistance - Patient 50 - 74%     Toileting Toileting    Toileting assist Assist for toileting: Moderate Assistance - Patient 50 - 74%(1/2 steps)     Transfers Chair/bed transfer  Transfers assist     Chair/bed transfer assist level: Moderate Assistance - Patient 50 - 74%     Locomotion Ambulation   Ambulation assist      Assist level: 2 helpers Assistive device: Walker-rolling Max distance: 30ft   Walk 10 feet activity   Assist     Assist level: 2 helpers Assistive device: Walker-rolling   Walk 50 feet activity   Assist Walk 50 feet with 2 turns activity did not occur: Safety/medical concerns         Walk 150 feet activity  Assist Walk 150 feet activity did not occur: Safety/medical concerns         Walk 10 feet on uneven surface  activity   Assist Walk 10 feet on uneven surfaces activity did not occur: Safety/medical concerns         Wheelchair     Assist Will patient use wheelchair at discharge?: (TBD) Type of Wheelchair: Manual    Wheelchair assist level: Minimal Assistance - Patient > 75% Max wheelchair distance: 50 ft    Wheelchair 50 feet with 2 turns  activity    Assist        Assist Level: Minimal Assistance - Patient > 75%   Wheelchair 150 feet activity     Assist     Assist Level: Minimal Assistance - Patient > 75%    Medical Problem List and Plan: 1.Functional deficits and left hemiparesissecondary to right MCA distribution embolic infarcts CIR PT, OT, SLP, progressing overall well but has difficulties related to left upper extremity and left lower extremity proprioceptive deficits 2. Antithrombotics: -DVT/anticoagulation:Pharmaceutical:Lovenox -antiplatelet therapy: ASA/Plavix 3. Pain Management:Tylenol prn 4. Mood:LCSW to follow for evaluation and support. -antipsychotic agents: N/A 5. Neuropsych: This patientiscapable of making decisions on herown behalf. 6. Skin/Wound Care:Routine pressure relief measuresRIght Upper lip   laceration sutures out 7. Fluids/Electrolytes/Nutrition:Monitor I/O. -I personally reviewed the patient's labs today. -replace potassium (3.0) 8. T2DM: New diagnosis with Hgb A1c-9.3. Educate on Carb modified diet. Change Lantus/novolog to low dose metformin titrate upwardsas indicated. Will continue to use sliding scale insulin for elevated blood sugars. CBG (last 3)  Recent Labs    06/19/19 1634 06/19/19 2114 06/20/19 0626  GLUCAP 147* 181* 157*  will reduce to 250mg  and monitor  Side effect of diarrhea 9. Bilateral carotid artery stenosis:Follow-up with vascular surgery after discharge 10. New onsetsimple partialseizures: Continue Keppra 1000 mg bid. 11. Morbid obesity: BMI31.0--educated patient on importance of weight loss to help with overall health and mobility. 12. HTN: Monitor BP tid--avoid hypotension.  Vitals:   06/19/19 2143 06/20/19 0553  BP: (!) 148/64 (!) 147/65  Pulse: 81 77  Resp: 16 16  Temp: 98 F (36.7 C) 97.8 F (36.6 C)  SpO2: 97% 97%  Will increase Zestril to 20 mg/day,  blood pressure starting to respond 13. Tobacco use: Continue to educate on importance of cessation.  14. Dyslipidemia:Continue statin #15 hypokalemia resolved on oral KCl cont KCL 24meq BID,on jhigh side will reduce CKL to 78meq per day     16.  Loose stools improved on reduced metformin dose  LOS: 11 days A FACE TO FACE EVALUATION WAS PERFORMED  Charlett Blake 06/20/2019, 8:33 AM

## 2019-06-20 NOTE — Progress Notes (Signed)
Physical Therapy Session Note  Patient Details  Name: Tammy Nunez MRN: [id number] Date of Birth: 04-23-1951  Today's Date: 06/20/2019 PT Individual Time: 0900-1012 PT Individual Time Calculation (min): 72 min   Short Term Goals: Week 2:  PT Short Term Goal 1 (Week 2): Pt will perform supine<>sit with CGA PT Short Term Goal 2 (Week 2): Pt will perform bed<>chair transfers with min assist PT Short Term Goal 3 (Week 2): Pt will perform sit<>stands using LRAD with CGA PT Short Term Goal 4 (Week 2): Pt will ambulate 36ft using RW with max assist  Skilled Therapeutic Interventions/Progress Updates:    Pt supine in bed upon PT arrival, agreeable to therapy tx and denies pain. Pt transferred to sitting to don pants, sit<>stand with min assist in order to pull pants over hips. Stand pivot to w/c with min assist and pt transported to the gym. Stand pivot to the mat with min assist and cues for techniques/foot placement. Pt performed x 10 sit<>stands without UE support for LE strength and neuro re-ed, cues for foot placement. Pt worked on pre-gait and stepping in place with RW for UE support, cues/facilitation for L foot placement when stepping with L LE, blocking L knee when stepping with R LE, x 2 trials with min assist. Pt worked on standing balance without UE support while performing horseshoe toss activity, min assist. Pt transferred to w/c with min assist stand pivot and transported to hallway. Therapist donned ace wrap for L DF and eversion, pt ambulated 2 x 25 ft using R rail with min assist and cues for step length and foot placement. Pt ambulated x 20 ft with RW and min-mod assist with cues for step length, foot placement and RW management. Pt transported to the dayroom. Stand pivot from w/c<>nustep this session with min assist. Pt used nustep x 8 minutes for reciprocal extremity movement and neuro re-ed, resistance level 4. Pt worked on w/c propulsion this session, difficulty coordinating R UE and  R LE for propulsion despite cues, so pt propelled w/c with R LE only x 100 ft with CGA-min assist and cues for attention to L side. Pt left in w/c at end of session with needs in reach and chair alarm set.   Therapy Documentation Precautions:  Precautions Precautions: Fall Precaution Comments: left hemiparesis Restrictions Weight Bearing Restrictions: No    Therapy/Group: Individual Therapy  Netta Corrigan, PT, DPT 06/20/2019, 8:00 AM

## 2019-06-21 ENCOUNTER — Ambulatory Visit (HOSPITAL_COMMUNITY): Payer: PPO | Admitting: Occupational Therapy

## 2019-06-21 ENCOUNTER — Inpatient Hospital Stay (HOSPITAL_COMMUNITY): Payer: PPO

## 2019-06-21 LAB — GLUCOSE, CAPILLARY
Glucose-Capillary: 159 mg/dL — ABNORMAL HIGH (ref 70–99)
Glucose-Capillary: 146 mg/dL — ABNORMAL HIGH (ref 70–99)
Glucose-Capillary: 120 mg/dL — ABNORMAL HIGH (ref 70–99)
Glucose-Capillary: 197 mg/dL — ABNORMAL HIGH (ref 70–99)

## 2019-06-21 LAB — BASIC METABOLIC PANEL
Anion gap: 8 (ref 5–15)
BUN: 25 mg/dL — ABNORMAL HIGH (ref 8–23)
CO2: 20 mmol/L — ABNORMAL LOW (ref 22–32)
Calcium: 9.8 mg/dL (ref 8.9–10.3)
Chloride: 110 mmol/L (ref 98–111)
Creatinine, Ser: 1.28 mg/dL — ABNORMAL HIGH (ref 0.44–1.00)
GFR calc Af Amer: 50 mL/min — ABNORMAL LOW (ref >60)
GFR calc non Af Amer: 43 mL/min — ABNORMAL LOW (ref >60)
Glucose, Bld: 160 mg/dL — ABNORMAL HIGH (ref 70–99)
Potassium: 5.4 mmol/L — ABNORMAL HIGH (ref 3.5–5.1)
Sodium: 138 mmol/L (ref 135–145)

## 2019-06-21 NOTE — Progress Notes (Signed)
Speech Language Pathology Daily Session Note  Patient Details  Name: Tammy Nunez MRN: [id number] Date of Birth: September 01, 1951  Today's Date: 06/21/2019 SLP Individual Time: 5784-6962 SLP Individual Time Calculation (min): 44 min  Short Term Goals: Week 2: SLP Short Term Goal 1 (Week 2): Patient will demonstrate efficient mastication with complete oral clearance with trials of regular textures of 2 sessions prior to upgrade. SLP Short Term Goal 2 (Week 2): Patient will demonstrate semi-complex problem solving for familiar tasks with Min A cues. SLP Short Term Goal 3 (Week 2): Patient will self-monitor and correct errors during functional tasks with Min A cues. SLP Short Term Goal 4 (Week 2): Patient will scan to left field of enviornment during functional tasks with Mod A verbal cues.  Skilled Therapeutic Interventions: Skilled ST services focused on swallow and cognitive skills. SLP facilitated basic problem solving and error awareness skills in familiar tasks, pt required mod A verbal cues during task of WAR and min A verbal cues for verbal sequencing and max A verbal cues for functional sequencing of 3 step cards. SLP also facilitated verbal semi-complex/complex problem solving utilizing ALFA pt demonstrated 0/4 accuracy. Pt consumed regular textured trial with appropriate oral clearance and no overt s/s aspiration. Pt was left in room with call bell within reach and chair alarm set. ST recommends to continue skilled ST services.      Pain Pain Assessment Pain Scale: Faces Pain Score: 0-No pain  Therapy/Group: Individual Therapy  Lorrayne Ismael  [facility] 06/21/2019, 3:51 PM

## 2019-06-21 NOTE — Progress Notes (Signed)
Orthopedic Tech Progress Note Patient Details:  Tammy Nunez [birth year] [id number] Called in order to [employer] for an AFO for patient Patient ID: Tammy Nunez, female   DOB: [birth year], 68 y.o.   MRN: [id number]   [name] 06/21/2019, 8:58 AM

## 2019-06-21 NOTE — Progress Notes (Signed)
Physical Therapy Session Note  Patient Details  Name: Tammy Nunez MRN: [id number] Date of Birth: October 03, 1951  Today's Date: 06/21/2019 PT Individual Time: 0801-0859 PT Individual Time Calculation (min): 58 min   Short Term Goals: Week 2:  PT Short Term Goal 1 (Week 2): Pt will perform supine<>sit with CGA PT Short Term Goal 2 (Week 2): Pt will perform bed<>chair transfers with min assist PT Short Term Goal 3 (Week 2): Pt will perform sit<>stands using LRAD with CGA PT Short Term Goal 4 (Week 2): Pt will ambulate 20ft using RW with max assist  Skilled Therapeutic Interventions/Progress Updates:    Pt supine in bed upon PT arrival, agreeable to therapy tx and denies pain. Pt transferred to sitting with supervision and use of bedrail, Pt seated EOB while therapist assisted to loop LEs through pants, sit<>stand with min assist and then pt pulled pants over hips with min assist. Stand pivot to w/c with min assist and transported to the gym. Therapist fitted patient to a trial AFO this session in order to work on gait training with use of L AFO (will need orthotist to further assess as the GRAFO was the only orthoticavailable to fit patients small size shoe/foot). Pt performed blocked practice stand pivot transfers in each direction with cues to step and then turn, min assist with cues for attention to L LE placement. Pt worked on L LE weightbearing and pre-gait with use of L AFO in order to perform x 10 steps in place with R LE and no UE support, min assist. Pt worked on L LE weightbearing and stance control in order to perform toe taps on aerobic step with R LE, x 2 trials with min/mod assist and cues to minimize excessive L lateral weightshift/lean. Pt transferred to w/c with min assist and transported into hallway. Pt ambulated x 25 ft this session with RW and L GRAFO, min-mod assist with cues for smaller L step length and cues for L foot placement. Pt transported back to room and left in w/c with  needs in reach and chair alarm set. Discussed purpose of AFO use and plan of having orthotist come to therapy session in order to assess orthotic needs for the patient, patient agreeable.   Therapy Documentation Precautions:  Precautions Precautions: Fall Precaution Comments: left hemiparesis Restrictions Weight Bearing Restrictions: No    Therapy/Group: Individual Therapy  Netta Corrigan, PT, DPT 06/21/2019, 7:46 AM

## 2019-06-21 NOTE — Progress Notes (Signed)
[facility] PHYSICAL MEDICINE & REHABILITATION PROGRESS NOTE   Subjective/Complaints:  Patient worked with physical therapy this morning, a trial left AFO was utilized and was very helpful with gait activities  ROS- neg CP, SOB, N/V  Objective:   No results found. Recent Labs    06/20/19 0732  WBC 5.4  HGB 15.8*  HCT 48.3*  PLT 229   Recent Labs    06/20/19 0732 06/21/19 0657  NA 138 138  K 5.5* 5.4*  CL 110 110  CO2 16* 20*  GLUCOSE 158* 160*  BUN 21 25*  CREATININE 1.08* 1.28*  CALCIUM 10.0 9.8    Intake/Output Summary (Last 24 hours) at 06/21/2019 1602 Last data filed at 06/21/2019 1323 Gross per 24 hour  Intake 360 ml  Output -  Net 360 ml     Physical Exam: Vital Signs Blood pressure 138/69, pulse 88, temperature 97.7 F (36.5 C), resp. rate 18, height 5\' 2"  (1.575 m), weight 77 kg, SpO2 100 %.  General: No acute distress HEENT R upper lip sutures intact well approximated vermillion border with mild swelling  Mood and affect are appropriate Heart: Regular rate and rhythm no rubs murmurs or extra sounds Lungs: Clear to auscultation, breathing unlabored, no rales or wheezes Abdomen: Positive bowel sounds, soft nontender to palpation, nondistended Extremities: No clubbing, cyanosis, or edema Skin: No evidence of breakdown, no evidence of rash Neurologic: Cranial nerves II through XII intact, motor strength is 2-/5 in left , bicep, tricep,2- finger flexors , 2- hip flexor, knee extensors, ankle dorsiflexor and plantar flexor  Musculoskeletal: Full range of motion in all 4 extremities. No joint swelling    Assessment/Plan: 1. Functional deficits secondary to Left Hemi R MCA which require 3+ hours per day of interdisciplinary therapy in a comprehensive inpatient rehab setting.  Physiatrist is providing close team supervision and 24 hour management of active medical problems listed below.  Physiatrist and rehab team continue to assess barriers to  discharge/monitor patient progress toward functional and medical goals  Care Tool:  Bathing    Body parts bathed by patient: Left arm, Chest, Abdomen, Front perineal area, Right upper leg, Left upper leg, Right lower leg, Face, Buttocks   Body parts bathed by helper: Left lower leg, Right arm     Bathing assist Assist Level: Moderate Assistance - Patient 50 - 74%     Upper Body Dressing/Undressing Upper body dressing Upper body dressing/undressing activity did not occur (including orthotics): Safety/medical concerns What is the patient wearing?: Pull over shirt    Upper body assist Assist Level: Moderate Assistance - Patient 50 - 74%    Lower Body Dressing/Undressing Lower body dressing      What is the patient wearing?: Pants, Incontinence brief     Lower body assist Assist for lower body dressing: Moderate Assistance - Patient 50 - 74%     Toileting Toileting    Toileting assist Assist for toileting: Moderate Assistance - Patient 50 - 74%(1/2 steps)     Transfers Chair/bed transfer  Transfers assist     Chair/bed transfer assist level: Minimal Assistance - Patient > 75%     Locomotion Ambulation   Ambulation assist      Assist level: Moderate Assistance - Patient 50 - 74% Assistive device: Walker-rolling Max distance: 25 ft   Walk 10 feet activity   Assist     Assist level: Moderate Assistance - Patient - 50 - 74% Assistive device: Walker-rolling   Walk 50 feet activity  Assist Walk 50 feet with 2 turns activity did not occur: Safety/medical concerns         Walk 150 feet activity   Assist Walk 150 feet activity did not occur: Safety/medical concerns         Walk 10 feet on uneven surface  activity   Assist Walk 10 feet on uneven surfaces activity did not occur: Safety/medical concerns         Wheelchair     Assist Will patient use wheelchair at discharge?: (TBD) Type of Wheelchair: Manual    Wheelchair assist  level: Minimal Assistance - Patient > 75% Max wheelchair distance: 50 ft    Wheelchair 50 feet with 2 turns activity    Assist        Assist Level: Minimal Assistance - Patient > 75%   Wheelchair 150 feet activity     Assist     Assist Level: Minimal Assistance - Patient > 75%    Medical Problem List and Plan: 1.Functional deficits and left hemiparesissecondary to right MCA distribution embolic infarcts CIR PT, OT, SLP, will order left AFO 2. Antithrombotics: -DVT/anticoagulation:Pharmaceutical:Lovenox -antiplatelet therapy: ASA/Plavix 3. Pain Management:Tylenol prn 4. Mood:LCSW to follow for evaluation and support. -antipsychotic agents: N/A 5. Neuropsych: This patientiscapable of making decisions on herown behalf. 6. Skin/Wound Care:Routine pressure relief measuresRIght Upper lip   laceration sutures out 7. Fluids/Electrolytes/Nutrition:Monitor I/O. -I personally reviewed the patient's labs today. -replace potassium (3.0) 8. T2DM: New diagnosis with Hgb A1c-9.3. Educate on Carb modified diet. Change Lantus/novolog to low dose metformin titrate upwardsas indicated. Will continue to use sliding scale insulin for elevated blood sugars. CBG (last 3)  Recent Labs    06/20/19 2110 06/21/19 0616 06/21/19 1117  GLUCAP 123* 159* 146*  will reduce to 250mg  and monitor  Diarrhea is improved on lower dose 9. Bilateral carotid artery stenosis:Follow-up with vascular surgery after discharge 10. New onsetsimple partialseizures: Continue Keppra 1000 mg bid. 11. Morbid obesity: BMI31.0--educated patient on importance of weight loss to help with overall health and mobility. 12. HTN: Monitor BP tid--avoid hypotension.  Vitals:   06/21/19 0530 06/21/19 1311  BP: (!) 141/63 138/69  Pulse: 78 88  Resp: 15 18  Temp: 98.4 F (36.9 C) 97.7 F (36.5 C)  SpO2: 96% 100%   Zestril 20 mg/day, since  7/24 blood pressures improving 13. Tobacco use: Continue to educate on importance of cessation.  14. Dyslipidemia:Continue statin #15 hypokalemia resolved, discontinue KCl.   16.  Loose stools improved on reduced metformin dose  LOS: 12 days A FACE TO FACE EVALUATION WAS PERFORMED  Charlett Blake 06/21/2019, 4:02 PM

## 2019-06-21 NOTE — Progress Notes (Signed)
Occupational Therapy Session Note  Patient Details  Name: Tammy Nunez MRN: [id number] Date of Birth: [birth year]  Today's Date: 06/21/2019 OT Individual Time: 1001-1058 OT Individual Time Calculation (min): 57 min    Short Term Goals: Week 2:  OT Short Term Goal 1 (Week 2): Pt will complete toilet transfer with min A. OT Short Term Goal 2 (Week 2): Pt will be able to don shirt with min A. OT Short Term Goal 3 (Week 2): Pt will complete shower transfer with mod assist stand pivot. OT Short Term Goal 4 (Week 2): Pt will scan left of midline for locating grooming items at the sink with no more than min questioning cueing. OT Short Term Goal 5 (Week 2): Pt will use the LUE for washing the right arm with mod assist during bathing tasks.  Skilled Therapeutic Interventions/Progress Updates:    Pt completed bathing and dressing during session.  Mod assist for stand pivot transfer from the wheelchair to the tub bench.  Max assist for removal of left shoe and AFO as well as right.  She was able to remove her gripper socks and her pullover shirt with increased time, mod instructional cueing for technique.  She needed mod assist for removal of LB clothing.  Bathing sit to stand with overall mod assist.  Pt with decreased awareness of LLE positioning prior to standing.  When standing the LLE was sliding out laterally away from her and she demonstrated no awareness of this causing her to have to sit back down on the shower seat abruptly.  She needed max hand over hand for integration of the LUE into washing the right arm with mod instructional cueing to sequence through bathing tasks.  Dressing was completed sit to stand at the sink.  Max assist for orientation of clothing and for being able to determine and see the left side of garments.  Mod assist for donning pullover shirt as well as brief and pants.  Max assist for donning gripper socks.  Finished session with pt in the wheelchair with call button and  phone in reach and safety belt in place.    Therapy Documentation Precautions:  Precautions Precautions: Fall Precaution Comments: left hemiparesis Restrictions Weight Bearing Restrictions: No  Pain: Pain Assessment Pain Scale: Faces Pain Score: 0-No pain ADL: See Care Tool Section for some details of ADL  Therapy/Group: Individual Therapy  , OTR/L 06/21/2019, 12:17 PM

## 2019-06-21 NOTE — Plan of Care (Signed)
  Problem: RH Awareness Goal: LTG: Patient will demonstrate awareness during functional activites type of (SLP) Description: LTG: Patient will demonstrate awareness during functional activites type of (SLP) Flowsheets (Taken 06/21/2019 1547) Patient will demonstrate during cognitive/linguistic activities awareness type of: Emergent LTG: Patient will demonstrate awareness during cognitive/linguistic activities with assistance of (SLP): (downgraded level of difficulity due to lack of progress and influence of visual/ perceptional deficits impacting error awareness) -- Note: Downgraded to level of difficulty due to lack of progress and influence of visual/ perceptional deficits impacting error awareness

## 2019-06-22 ENCOUNTER — Inpatient Hospital Stay (HOSPITAL_COMMUNITY): Payer: PPO

## 2019-06-22 ENCOUNTER — Inpatient Hospital Stay (HOSPITAL_COMMUNITY): Payer: PPO | Admitting: Occupational Therapy

## 2019-06-22 LAB — GLUCOSE, CAPILLARY
Glucose-Capillary: 174 mg/dL — ABNORMAL HIGH (ref 70–99)
Glucose-Capillary: 150 mg/dL — ABNORMAL HIGH (ref 70–99)
Glucose-Capillary: 128 mg/dL — ABNORMAL HIGH (ref 70–99)
Glucose-Capillary: 134 mg/dL — ABNORMAL HIGH (ref 70–99)

## 2019-06-22 NOTE — Progress Notes (Signed)
Speech Language Pathology Daily Session Note  Patient Details  Name: Tammy Nunez MRN: [id number] Date of Birth: Apr 17, 1951  Today's Date: 06/22/2019 SLP Individual Time: 1130-1200, 902-945 SLP Individual Time Calculation (min): 30 min and 43 min  Short Term Goals: Week 2: SLP Short Term Goal 1 (Week 2): Patient will demonstrate efficient mastication with complete oral clearance with trials of regular textures of 2 sessions prior to upgrade. SLP Short Term Goal 1 - Progress (Week 2): Met SLP Short Term Goal 2 (Week 2): Patient will demonstrate semi-complex problem solving for familiar tasks with Min A cues. SLP Short Term Goal 3 (Week 2): Patient will self-monitor and correct errors during functional tasks with Min A cues. SLP Short Term Goal 4 (Week 2): Patient will scan to left field of enviornment during functional tasks with Mod A verbal cues.  Skilled Therapeutic Interventions: 1#  Skilled ST services focused on cognitive skills. SLP facilitated left scanning and visual track skills utilizing personal medication list, pt initially demonstrated 7/18 (38%) accuracy on initial task, following instruction of finger tracking, pt demonstrated 16/18 (88%) accuracy recalling with aid medication name, function and times per day. SLP also facilitated semi-complex verbal problem solving skills pertaining to medication management utilzing ALFA, pt demonstrated 3/4 (75%) accuracy in correct understand of various times per day, however functional unable to place medication in correct "box" in "pill box" likely due to visual/ perceptional deficits.  Pt was left in room with call bell within reach and bed alarm set. ST recommends to continue skilled ST services.   2#  Skilled ST services focused on swallow skills. SLP retrieved lunch tray from kitchen. SLP facilitated PO consumption of regular textured lunch tray with thin liquids, pt required set up assist, demonstrated ability to clear oral cavity with  lingual sweeps/liquid and no overt s/s aspirations. SLP upgraded diet to regular textured foods (heart health/carb mod) and intermittent supervision. Pt was left in room with call bell within reach and chair alarm set. ST recommends to continue skilled ST services.      Pain Pain Assessment Pain Scale: Faces Pain Score: 0-No pain Faces Pain Scale: No hurt  Therapy/Group: Individual Therapy    [facility] 06/22/2019, 12:05 PM

## 2019-06-22 NOTE — Progress Notes (Signed)
[place] PHYSICAL MEDICINE & REHABILITATION PROGRESS NOTE   Subjective/Complaints:  No issues overnite , denies diarrhea or abd pain   ROS- neg CP, SOB, N/V  Objective:   No results found. Recent Labs    06/20/19 0732  WBC 5.4  HGB 15.8*  HCT 48.3*  PLT 229   Recent Labs    06/20/19 0732 06/21/19 0657  NA 138 138  K 5.5* 5.4*  CL 110 110  CO2 16* 20*  GLUCOSE 158* 160*  BUN 21 25*  CREATININE 1.08* 1.28*  CALCIUM 10.0 9.8    Intake/Output Summary (Last 24 hours) at 06/22/2019 0843 Last data filed at 06/21/2019 1323 Gross per 24 hour  Intake 300 ml  Output -  Net 300 ml     Physical Exam: Vital Signs Blood pressure (!) 144/76, pulse 79, temperature 98.3 F (36.8 C), temperature source Oral, resp. rate 16, height 5' 2"  (1.575 m), weight 78 kg, SpO2 97 %.  General: No acute distress HEENT R upper lip sutures intact well approximated vermillion border with mild swelling  Mood and affect are appropriate Heart: Regular rate and rhythm no rubs murmurs or extra sounds Lungs: Clear to auscultation, breathing unlabored, no rales or wheezes Abdomen: Positive bowel sounds, soft nontender to palpation, nondistended Extremities: No clubbing, cyanosis, or edema Skin: No evidence of breakdown, no evidence of rash Neurologic: Cranial nerves II through XII intact, motor strength is 2-/5 in left , bicep, tricep,2- finger flexors , 2- hip flexor, knee extensors, ankle dorsiflexor and plantar flexor  Musculoskeletal: Full range of motion in all 4 extremities. No joint swelling    Assessment/Plan: 1. Functional deficits secondary to Left Hemi R MCA which require 3+ hours per day of interdisciplinary therapy in a comprehensive inpatient rehab setting.  Physiatrist is providing close team supervision and 24 hour management of active medical problems listed below.  Physiatrist and rehab team continue to assess barriers to discharge/monitor patient progress toward functional  and medical goals  Care Tool:  Bathing    Body parts bathed by patient: Left arm, Chest, Abdomen, Front perineal area, Right upper leg, Left upper leg, Right lower leg, Face, Buttocks   Body parts bathed by helper: Left lower leg, Right arm     Bathing assist Assist Level: Moderate Assistance - Patient 50 - 74%     Upper Body Dressing/Undressing Upper body dressing Upper body dressing/undressing activity did not occur (including orthotics): Safety/medical concerns What is the patient wearing?: Pull over shirt    Upper body assist Assist Level: Moderate Assistance - Patient 50 - 74%    Lower Body Dressing/Undressing Lower body dressing      What is the patient wearing?: Pants, Incontinence brief     Lower body assist Assist for lower body dressing: Moderate Assistance - Patient 50 - 74%     Toileting Toileting    Toileting assist Assist for toileting: Moderate Assistance - Patient 50 - 74%(1/2 steps)     Transfers Chair/bed transfer  Transfers assist     Chair/bed transfer assist level: Minimal Assistance - Patient > 75%     Locomotion Ambulation   Ambulation assist      Assist level: Moderate Assistance - Patient 50 - 74% Assistive device: Walker-rolling Max distance: 25 ft   Walk 10 feet activity   Assist     Assist level: Moderate Assistance - Patient - 50 - 74% Assistive device: Walker-rolling   Walk 50 feet activity   Assist Walk 50 feet with 2  turns activity did not occur: Safety/medical concerns         Walk 150 feet activity   Assist Walk 150 feet activity did not occur: Safety/medical concerns         Walk 10 feet on uneven surface  activity   Assist Walk 10 feet on uneven surfaces activity did not occur: Safety/medical concerns         Wheelchair     Assist Will patient use wheelchair at discharge?: (TBD) Type of Wheelchair: Manual    Wheelchair assist level: Minimal Assistance - Patient > 75% Max  wheelchair distance: 50 ft    Wheelchair 50 feet with 2 turns activity    Assist        Assist Level: Minimal Assistance - Patient > 75%   Wheelchair 150 feet activity     Assist     Assist Level: Minimal Assistance - Patient > 75%    Medical Problem List and Plan: 1.Functional deficits and left hemiparesissecondary to right MCA distribution embolic infarcts Team conference today please see physician documentation under team conference tab, met with team face-to-face to discuss problems,progress, and goals. Formulized individual treatment plan based on medical history, underlying problem and comorbidities. CIR PT, OT, SLP, will order left AFO 2. Antithrombotics: -DVT/anticoagulation:Pharmaceutical:Lovenox -antiplatelet therapy: ASA/Plavix 3. Pain Management:Tylenol prn 4. Mood:LCSW to follow for evaluation and support. -antipsychotic agents: N/A 5. Neuropsych: This patientiscapable of making decisions on herown behalf. 6. Skin/Wound Care:Routine pressure relief measuresRIght Upper lip   laceration sutures out 7. Fluids/Electrolytes/Nutrition:Monitor I/O. -I personally reviewed the patient's labs today. -replace potassium (3.0) 8. T2DM: New diagnosis with Hgb A1c-9.3. Educate on Carb modified diet. Change Lantus/novolog to low dose metformin titrate upwardsas indicated. Will continue to use sliding scale insulin for elevated blood sugars. CBG (last 3)  Recent Labs    06/21/19 1626 06/21/19 2124 06/22/19 0605  GLUCAP 120* 197* 174*  CBGs a little higher on low dose metformin but generally in range , may need to change to glimipride if consistently elevated  Diarrhea is improved on lower dose 9. Bilateral carotid artery stenosis:Follow-up with vascular surgery after discharge 10. New onsetsimple partialseizures: Continue Keppra 1000 mg bid. 11. Morbid obesity: BMI31.0--educated  patient on importance of weight loss to help with overall health and mobility. 12. HTN: Monitor BP tid--avoid hypotension.  Vitals:   06/21/19 1956 06/22/19 0604  BP: 135/88 (!) 144/76  Pulse: 80 79  Resp: 15 16  Temp: 98 F (36.7 C) 98.3 F (36.8 C)  SpO2: 96% 97%   Zestril 20 mg/day, since 7/24 blood pressures improving 13. Tobacco use: Continue to educate on importance of cessation.  14. Dyslipidemia:Continue statin #15 hypokalemia resolved, discontinue KCl.    16.  Loose stools improved on reduced metformin dose  LOS: 13 days A FACE TO FACE EVALUATION WAS PERFORMED  Charlett Blake 06/22/2019, 8:43 AM

## 2019-06-22 NOTE — Progress Notes (Signed)
Physical Therapy Session Note  Patient Details  Name: Tammy Nunez MRN: [id number] Date of Birth: 1951-03-09  Today's Date: 06/22/2019 PT Individual Time: 4193-7902 PT Individual Time Calculation (min): 58 min   Short Term Goals: Week 2:  PT Short Term Goal 1 (Week 2): Pt will perform supine<>sit with CGA PT Short Term Goal 2 (Week 2): Pt will perform bed<>chair transfers with min assist PT Short Term Goal 3 (Week 2): Pt will perform sit<>stands using LRAD with CGA PT Short Term Goal 4 (Week 2): Pt will ambulate 54ft using RW with max assist  Skilled Therapeutic Interventions/Progress Updates:    Pt seated in w/c upon PT arrival, agreeable to therapy tx and denies pain. Pt transported to the gym. Pt ambulated x 10 ft with RW to the mat, mod assist with cues for RW management and L foot placement. Pt worked on standing balance and stepping to target on the floor with L LE x 10 steps forward and x 10 steps lateral with cues for techniques, min assist with UE support on RW. Pt worked on L UE weightbearing while pushing down through the mat, x 4. Therapist performed elbow extension stretch. Standing with RW pt performed x 10 more lateral steps to target with L LE, min assist. While standing with RW pt worked on [employer] and L UE use to steer walker, in place pt worked on turning walker from side to side and then pushing RW forwards/back with cues for L UE use. Orthotist present for orthotics consult. Pt transferred to w/c with min assist. Pt ambulated x 20 ft with RW and mod assist, cues for shorter L step length and cues for L foot placement, ambulated without orthotic with poor foot clearance noted and occasional recurvatum. Orthotist recommending PLS AFO with toe cap. Pt transported back to room and left in w/c with needs in reach and chair alarm set.   Therapy Documentation Precautions:  Precautions Precautions: Fall Precaution Comments: left hemiparesis Restrictions Weight Bearing  Restrictions: No   Therapy/Group: Individual Therapy  Netta Corrigan, PT, DPT 06/22/2019, 12:20 PM

## 2019-06-22 NOTE — Progress Notes (Signed)
Recreational Therapy Session Note  Patient Details  Name: Tammy Nunez MRN: [id number] Date of Birth: [birth year] Today's Date: 06/22/2019 Time 09-1129 Pain: no c/o  Skilled Therapeutic Interventions/Progress Updates: Session focused on simple leisure task w/c level.  Pt required set up assistance for task completion.  Provided emotional support as pt discussing missing her family and ways that they support her now and PTA.  Word search puzzle book provided for pt to use during down time.  Therapy/Group: Individual Therapy  Dniya Neuhaus 06/22/2019, 3:31 PM

## 2019-06-22 NOTE — Progress Notes (Signed)
Occupational Therapy Session Note  Patient Details  Name: Tammy Nunez MRN: [id number] Date of Birth: December 31, 1950  Today's Date: 06/22/2019 OT Individual Time: 5035-4656 OT Individual Time Calculation (min): 54 min    Short Term Goals: Week 2:  OT Short Term Goal 1 (Week 2): Pt will complete toilet transfer with min A. OT Short Term Goal 2 (Week 2): Pt will be able to don shirt with min A. OT Short Term Goal 3 (Week 2): Pt will complete shower transfer with mod assist stand pivot. OT Short Term Goal 4 (Week 2): Pt will scan left of midline for locating grooming items at the sink with no more than min questioning cueing. OT Short Term Goal 5 (Week 2): Pt will use the LUE for washing the right arm with mod assist during bathing tasks.  Skilled Therapeutic Interventions/Progress Updates:    Pt completed supine to sit EOB with min assist to start session.  Min assist to transfer to the wheelchair on the right side squat pivot.  She worked on Office manager at the sink with supervision as well as washing and cleaning front and back peri area as well as donning brief and pants.  Min assist for bathing in standing with mod demonstrational cueing and min assist to maintain attention to the left knee as it would buckle without awareness to self correct.  She needed mod assist for donning brief and pants as well as pulling them over her hips.  Next therapist applied NMES to the left digit extensors.  Custom setting used with intensity on level 22 with one time 10 seconds and off 5 seconds.  PPS at 35 with pulse width at 300.  Pt incorporated elbow extension with shoulder flexion while stimulation was active for simulated reaching.  Mod facilitation for control at the shoulder and elbow as well as for decreasing the shoulder hike and trunk compensation.  Pt completed session with pt resting in the wheelchair with call button and phone in reach with safety belt in place.   Therapy Documentation Precautions:   Precautions Precautions: Fall Precaution Comments: left hemiparesis Restrictions Weight Bearing Restrictions: No  Pain: Pain Assessment Pain Scale: Faces Faces Pain Scale: No hurt ADL: See Care Tool Section for some details of ADL and mobility  Therapy/Group: Individual Therapy  Reganne Messerschmidt OTR/L 06/22/2019, 11:13 AM

## 2019-06-22 NOTE — Patient Care Conference (Signed)
Inpatient RehabilitationTeam Conference and Plan of Care Update Date: 06/22/2019   Time: 11:15 AM    Patient Name: Tammy Nunez      Medical Record Number: [id number]  Date of Birth: 06/04/1951 Sex: Female         Room/Bed: 4W19C/4W19C-01 Payor Info: Payor: [employer] / Plan: [employer] PPO / Product Type: *No Product type* /    Admitting Diagnosis: 4. CVA 2 Team  RT. CVA; 16-19days  Admit Date/Time:  06/09/2019  2:32 PM Admission Comments: No comment available   Primary Diagnosis:  <principal problem not specified> Principal Problem: <principal problem not specified>  Patient Active Problem List   Diagnosis Date Noted  . Acute ischemic right MCA stroke ([place]) 06/09/2019  . Acute CVA (cerebrovascular accident) ([place]) 06/06/2019    Expected Discharge Date: Expected Discharge Date: 07/01/19  Team Members Present: Physician leading conference: Dr. Alysia Penna Social Worker Present: Ovidio Kin, LCSW Nurse Present: Rayetta Humphrey, RN PT Present: Michaelene Song, PT OT Present: Clyda Greener, OT SLP Present: Charolett Bumpers, SLP PPS Coordinator present : Ileana Ladd, PT     Current Status/Progress Goal Weekly Team Focus  Medical   Left upper extremity weakness improving left lower extremity weakness improving still with proprioceptive deficits  Maintain medical stability reduce recurrent stroke risk reduce fall risk  Orthotic trial   Bowel/Bladder   Continent of B/B lbm 7/26, stool softrnrt and Laxative provided  Maintain continence  Asseess and address toileting needs in a timely manner, provide and monitor schedule and prn stool softener, laxatives and po intake   Swallow/Nutrition/ Hydration   dys 3 and thin, Mod I  Mod I  regular trial tray   ADL's   Min assist for UB bathing with mod assist for LB bathing.  Mod assist for UB dressing with mod to max for LB dressing.  Squat pivot transfers are at min assist with stand pivot transfers at mod assist.   Max assist for functional mobility short distances with use of the RW for support.  Still with significant right neglect and visual field deficit  Min assist overall  selfcare retraining, neuromscular reeducation, balance retraining, therapeutic exercise, transfer training, visual compensation, pt/family education   Mobility   supervision-min assist bed mobility, min assist transfers, min-mod assist gait with RW x20 ft  supervision-CGA  balance, LE strength, attention, L neuro re-ed, awareness, gait, d/c planning, education   Communication             Safety/Cognition/ Behavioral Observations  Mod-Min A  Min A - downgraded 7/24 and 7/28  basic/semi-complex problem solving, emergent awareness and left scanning   Pain   Denies pain  remain pain free < 2  QS/PRN assessment and monitoring with intervention as needed   Skin   Improvement of dryness to skin, and ecchomotic areas to bilateral abdomen/arms noted resolving, MASD shows improvement with routine regime, no indication of skin breakage  Maintain skin integrity  Assess QS and prn      *See Care Plan and progress notes for long and short-term goals.     Barriers to Discharge  Current Status/Progress Possible Resolutions Date Resolved   Physician    Medical stability     Progressing well towards goals  Continue rehab, see above      Nursing                  PT  OT                  SLP                SW                Discharge Planning/Teaching Needs:  Husband to be provide 24 hr care will need to come in for education prior to DC home.      Team Discussion:  Progressing toward her goals of supervision-CGA level. L-visual deficits and poor attention. MD working on BP and DM now off metformin due to loose stools. AFO helping with her ambulation. Speech to upgrade diet to regular. Will need family education with husband prior to DC home.  Revisions to Treatment Plan:  DC 8/7    Continued Need for Acute  Rehabilitation Level of Care: The patient requires daily medical management by a physician with specialized training in physical medicine and rehabilitation for the following conditions: Daily direction of a multidisciplinary physical rehabilitation program to ensure safe treatment while eliciting the highest outcome that is of practical value to the patient.: Yes Daily medical management of patient stability for increased activity during participation in an intensive rehabilitation regime.: Yes Daily analysis of laboratory values and/or radiology reports with any subsequent need for medication adjustment of medical intervention for : Neurological problems   I attest that I was present, lead the team conference, and concur with the assessment and plan of the team. Teleconference held due to COVID 19   , Gardiner Rhyme 06/22/2019, 1:19 PM

## 2019-06-23 ENCOUNTER — Other Ambulatory Visit (INDEPENDENT_AMBULATORY_CARE_PROVIDER_SITE_OTHER): Payer: Self-pay | Admitting: Nurse Practitioner

## 2019-06-23 ENCOUNTER — Inpatient Hospital Stay (HOSPITAL_COMMUNITY): Payer: PPO | Admitting: Occupational Therapy

## 2019-06-23 ENCOUNTER — Inpatient Hospital Stay (HOSPITAL_COMMUNITY): Payer: PPO | Admitting: Speech Pathology

## 2019-06-23 ENCOUNTER — Inpatient Hospital Stay (HOSPITAL_COMMUNITY): Payer: PPO | Admitting: Physical Therapy

## 2019-06-23 LAB — GLUCOSE, CAPILLARY
Glucose-Capillary: 167 mg/dL — ABNORMAL HIGH (ref 70–99)
Glucose-Capillary: 168 mg/dL — ABNORMAL HIGH (ref 70–99)
Glucose-Capillary: 111 mg/dL — ABNORMAL HIGH (ref 70–99)
Glucose-Capillary: 118 mg/dL — ABNORMAL HIGH (ref 70–99)

## 2019-06-23 MED ORDER — GLIMEPIRIDE 2 MG PO TABS
2.0000 mg | ORAL_TABLET | Freq: Every day | ORAL | Status: DC
  Administered 2019-06-23 – 2019-07-01 (×9): 2 mg via ORAL
  Filled 2019-06-23 (×9): qty 1

## 2019-06-23 NOTE — Progress Notes (Signed)
Speech Language Pathology Daily Session Note  Patient Details  Name: Tammy Nunez MRN: [id number] Date of Birth: 04-10-1951  Today's Date: 06/23/2019 SLP Individual Time: 1100-1130 SLP Individual Time Calculation (min): 30 min  Short Term Goals: Week 2: SLP Short Term Goal 1 (Week 2): Patient will demonstrate efficient mastication with complete oral clearance with trials of regular textures of 2 sessions prior to upgrade. SLP Short Term Goal 1 - Progress (Week 2): Met SLP Short Term Goal 2 (Week 2): Patient will demonstrate semi-complex problem solving for familiar tasks with Min A cues. SLP Short Term Goal 3 (Week 2): Patient will self-monitor and correct errors during functional tasks with Min A cues. SLP Short Term Goal 4 (Week 2): Patient will scan to left field of enviornment during functional tasks with Mod A verbal cues.  Skilled Therapeutic Interventions:  Skilled treatment session focused on cognition goals. SLP facilitated session by providing Mod A cues to utilize finger tracking to cancel specific letters. Left margin of page highlighted for increased visual cues. Pt obtained ~ 85% accuracy. Pt left upright in wheelchair with all needs within reach. Continue per current plan of care.      Pain Pain Assessment Pain Scale: Faces Pain Score: 0-No pain  Therapy/Group: Individual Therapy  Betsaida [place] 06/23/2019, 3:09 PM

## 2019-06-23 NOTE — Progress Notes (Signed)
[place] PHYSICAL MEDICINE & REHABILITATION PROGRESS NOTE   Subjective/Complaints:  Wasn't sure if she had gas or stool an dhad incont episode , stool is reportedly loose   ROS- neg CP, SOB, N/V  Objective:   No results found. No results for input(s): WBC, HGB, HCT, PLT in the last 72 hours. Recent Labs    06/21/19 0657  NA 138  K 5.4*  CL 110  CO2 20*  GLUCOSE 160*  BUN 25*  CREATININE 1.28*  CALCIUM 9.8    Intake/Output Summary (Last 24 hours) at 06/23/2019 0811 Last data filed at 06/22/2019 2100 Gross per 24 hour  Intake 740 ml  Output -  Net 740 ml     Physical Exam: Vital Signs Blood pressure 130/65, pulse 86, temperature 98.2 F (36.8 C), resp. rate 18, height 5\' 2"  (1.575 m), weight 78 kg, SpO2 97 %.  General: No acute distress HEENT R upper lip sutures intact well approximated vermillion border with mild swelling  Mood and affect are appropriate Heart: Regular rate and rhythm no rubs murmurs or extra sounds Lungs: Clear to auscultation, breathing unlabored, no rales or wheezes Abdomen: Positive bowel sounds, soft nontender to palpation, nondistended Extremities: No clubbing, cyanosis, or edema Skin: No evidence of breakdown, no evidence of rash Neurologic: Cranial nerves II through XII intact, motor strength is 2-/5 in left , bicep, tricep,2- finger flexors , 2- hip flexor, knee extensors, ankle dorsiflexor and plantar flexor  Musculoskeletal: Full range of motion in all 4 extremities. No joint swelling    Assessment/Plan: 1. Functional deficits secondary to Left Hemi R MCA which require 3+ hours per day of interdisciplinary therapy in a comprehensive inpatient rehab setting.  Physiatrist is providing close team supervision and 24 hour management of active medical problems listed below.  Physiatrist and rehab team continue to assess barriers to discharge/monitor patient progress toward functional and medical goals  Care Tool:  Bathing    Body  parts bathed by patient: Front perineal area, Buttocks   Body parts bathed by helper: Left lower leg, Right arm Body parts n/a: Right arm, Left arm, Chest, Abdomen, Left lower leg, Right lower leg, Face, Left upper leg, Right upper leg   Bathing assist Assist Level: Minimal Assistance - Patient > 75%     Upper Body Dressing/Undressing Upper body dressing Upper body dressing/undressing activity did not occur (including orthotics): Safety/medical concerns What is the patient wearing?: Pull over shirt    Upper body assist Assist Level: Moderate Assistance - Patient 50 - 74%    Lower Body Dressing/Undressing Lower body dressing      What is the patient wearing?: Pants, Incontinence brief     Lower body assist Assist for lower body dressing: Moderate Assistance - Patient 50 - 74%     Toileting Toileting    Toileting assist Assist for toileting: Moderate Assistance - Patient 50 - 74%(1/2 steps)     Transfers Chair/bed transfer  Transfers assist     Chair/bed transfer assist level: Minimal Assistance - Patient > 75%     Locomotion Ambulation   Ambulation assist      Assist level: Moderate Assistance - Patient 50 - 74% Assistive device: Walker-rolling Max distance: 20 ft   Walk 10 feet activity   Assist     Assist level: Moderate Assistance - Patient - 50 - 74% Assistive device: Walker-rolling   Walk 50 feet activity   Assist Walk 50 feet with 2 turns activity did not occur: Safety/medical concerns  Walk 150 feet activity   Assist Walk 150 feet activity did not occur: Safety/medical concerns         Walk 10 feet on uneven surface  activity   Assist Walk 10 feet on uneven surfaces activity did not occur: Safety/medical concerns         Wheelchair     Assist Will patient use wheelchair at discharge?: (TBD) Type of Wheelchair: Manual    Wheelchair assist level: Minimal Assistance - Patient > 75% Max wheelchair distance: 50  ft    Wheelchair 50 feet with 2 turns activity    Assist        Assist Level: Minimal Assistance - Patient > 75%   Wheelchair 150 feet activity     Assist     Assist Level: Minimal Assistance - Patient > 75%    Medical Problem List and Plan: 1.Functional deficits and left hemiparesissecondary to right MCA distribution embolic infarcts Left sided strength improving  CIR PT, OT, SLP, will order left AFO 2. Antithrombotics: -DVT/anticoagulation:Pharmaceutical:Lovenox -antiplatelet therapy: ASA/Plavix 3. Pain Management:Tylenol prn 4. Mood:LCSW to follow for evaluation and support. -antipsychotic agents: N/A 5. Neuropsych: This patientiscapable of making decisions on herown behalf. 6. Skin/Wound Care:Routine pressure relief measuresRIght Upper lip   laceration sutures out 7. Fluids/Electrolytes/Nutrition:Monitor I/O. -I personally reviewed the patient's labs today. -replace potassium (3.0) 8. T2DM: New diagnosis with Hgb A1c-9.3. Educate on Carb modified diet. Change Lantus/novolog to low dose metformin titrate upwardsas indicated. Will continue to use sliding scale insulin for elevated blood sugars. CBG (last 3)  Recent Labs    06/22/19 1703 06/22/19 2143 06/23/19 0624  GLUCAP 128* 134* 167*  CBGs generally controlled but stools still loose and has incont episodes will switch to glimipride  9. Bilateral carotid artery stenosis:Follow-up with vascular surgery after discharge 10. New onsetsimple partialseizures: Continue Keppra 1000 mg bid. 11. Morbid obesity: BMI31.0--educated patient on importance of weight loss to help with overall health and mobility. 12. HTN: Monitor BP tid--avoid hypotension.  Vitals:   06/22/19 1436 06/23/19 0522  BP: (!) 148/68 130/65  Pulse: 91 86  Resp: 18 18  Temp: 97.9 F (36.6 C) 98.2 F (36.8 C)  SpO2: 97% 97%   Zestril 20 mg/day, since 7/24  blood pressures controlled 7/30  13. Tobacco use: Continue to educate on importance of cessation.  14. Dyslipidemia:Continue statin #15 hypokalemia resolved, discontinue KCl.    16.  Loose stools improved on reduced metformin dose  LOS: 14 days A FACE TO FACE EVALUATION WAS PERFORMED  Charlett Blake 06/23/2019, 8:11 AM

## 2019-06-23 NOTE — Progress Notes (Signed)
Physical Therapy Session Note  Patient Details  Name: Tammy Nunez MRN: [id number] Date of Birth: 11-12-1951  Today's Date: 06/23/2019 PT Individual Time: 0908-1021 PT Individual Time Calculation (min): 73 min   Short Term Goals: Week 2:  PT Short Term Goal 1 (Week 2): Pt will perform supine<>sit with CGA PT Short Term Goal 2 (Week 2): Pt will perform bed<>chair transfers with min assist PT Short Term Goal 3 (Week 2): Pt will perform sit<>stands using LRAD with CGA PT Short Term Goal 4 (Week 2): Pt will ambulate 35ft using RW with max assist  Skilled Therapeutic Interventions/Progress Updates:    Pt received supine in bed and agreeable to therapy session. Supine>sit, HOB flat and using bedrails, with min assist for trunk upright and cuing for sequencing. Donned pants with mod assist for time management. Sit<>stands throughout session with CGA/min assist for steadying - when using RW min/mod cuing for set-up and L UE placement on/off RW orthotic 1x mod assist for balance while pt strapping L hand into orthotic. Stand pivot EOB>w/c, no AD, with min assist for balance and cuing for sequencing - pt demonstrating improving trunk control with decreased L lean.  Transported to gym in w/c. Squat pivot w/c>EOM with CGA for steadying. Donned ACE wrap on L LE to assist with ankle DF and eversion as AFO and toe cap not yet delivered from orthotist. Pre-gait training focusing on L LE stepping forwards/backwards towards external target with B UE support on RW and min assist for balance - progressed to tapping up/down on 4" step for increased hip flexion - external targets throughout for improved foot placement and mirror for visual feedback. Pre-gait training focusing on L LE NMR for stance control while stepping R LE forwards/back progressed to tapping up/down on 4" step with B UE support on RW and min assist for balance - mirror feedback throughout. Ambulated ~59ft-40ft x3 (seated breaks between) using RW with  min/mod assist for balance and max/mod cuing with external target for L LE placement during swing phase - pt demonstrating improving trunk control with decreased L lateral lean. Performed R hemi technique w/c propulsion ~34ft back to room with min assist and mod cuing for use of R LE to direct chair and avoid obstacles on the L. Pt left sitting in w/c with needs in reach and seat belt alarm on.  Therapy Documentation Precautions:  Precautions Precautions: Fall Precaution Comments: left hemiparesis Restrictions Weight Bearing Restrictions: No   Pain:  No reports of pain during session.   Therapy/Group: Individual Therapy  Tawana Scale, PT, DPT 06/23/2019, 7:50 AM

## 2019-06-23 NOTE — Progress Notes (Signed)
Occupational Therapy Weekly Progress Note  Patient Details  Name: Tammy Nunez MRN: [id number] Date of Birth: 10/28/1951  Beginning of progress report period: June 17, 2019 End of progress report period: June 23, 2019  Today's Date: 06/23/2019 OT Individual Time: 1301-1416 OT Individual Time Calculation (min): 75 min    Patient has met 4 of 5 short term goals.  Pt is making steady progress with OT at this time.  She is completing bathing sit to stand with mod assist.  Transfers to the toilet and shower with min assist squat pivot.  Mod assist for stand pivot transfers or short distance functional mobility.  She continues to need mod assist for dressing both UB and LB with min assist for sit to stand.  Left neglect is present as well as left visual field deficit.  She continues to demonstrate left hemiparesis at a Brunnstrum stage IV movement in the left arm with stage III in the hand.  Gross digit flexion to 60% is present with only trace digit extension.  Feel she is on target for min assist level goals in preparation for discharge home on 07/01/19.  Will continue with current OT POC and comprehensive OT treatment.   Patient continues to demonstrate the following deficits: muscle weakness, impaired timing and sequencing, unbalanced muscle activation and decreased coordination, decreased problem solving, decreased memory and delayed processing and decreased sitting balance, decreased standing balance, decreased postural control, hemiplegia and decreased balance strategies and therefore will continue to benefit from skilled OT intervention to enhance overall performance with BADL, iADL and Reduce care partner burden.  Patient progressing toward long term goals..  Continue plan of care.  OT Short Term Goals Week 3:  OT Short Term Goal 1 (Week 3): Continue working on established LTGs set at min assist overall.  Skilled Therapeutic Interventions/Progress Updates:    Pt completed bathing and  dressing during session.  Mod assist for functional mobility to the shower bench in the bathroom.  Once on the seat she needed mod assist for removal of all clothing before beginning shower.  She was able to complete all bathing with min assist.  Mod instructional cueing for positioning of the LLE before standing, as well as when standing as pt demonstrated no awareness of it sliding to the side.  She needed mod facilitation to use the LUE for washing the right arm.  She transferred stand pivot with mod assist to the wheelchair for dressing at the sink.  Mod assist with mod demonstrational cueing for donning pullover shirt secondary to perceptual deficits and left neglect.  She continues to have difficulty determining parts of the shirt as well as the inside and outside of it as well.  Mod assist for LB dressing as well with min assist to cross the LLE over the right knee and maintain to thread items over her foot.  Min assist for sit to stand with mod assist to thoroughly pull pants over hips.  Finished session with pt completing grooming task of brushing her teeth with supervision and assist to open toothpaste.  Pt left in the wheelchair with call button and phone in reach with safety belt in place.    Therapy Documentation Precautions:  Precautions Precautions: Fall Precaution Comments: left hemiparesis Restrictions Weight Bearing Restrictions: No Pain: Pain Assessment Pain Scale: Faces Pain Score: 0-No pain ADL: See Care Tool Section for some details of ADL  Therapy/Group: Individual Therapy  MCGUIRE,JAMES 06/23/2019, 2:40 PM   

## 2019-06-24 ENCOUNTER — Other Ambulatory Visit: Payer: PPO | Attending: Vascular Surgery

## 2019-06-24 ENCOUNTER — Inpatient Hospital Stay (HOSPITAL_COMMUNITY): Payer: PPO | Admitting: Speech Pathology

## 2019-06-24 ENCOUNTER — Inpatient Hospital Stay (HOSPITAL_COMMUNITY): Payer: PPO | Admitting: Physical Therapy

## 2019-06-24 ENCOUNTER — Inpatient Hospital Stay (HOSPITAL_COMMUNITY): Payer: PPO

## 2019-06-24 LAB — BASIC METABOLIC PANEL
Anion gap: 10 (ref 5–15)
BUN: 33 mg/dL — ABNORMAL HIGH (ref 8–23)
CO2: 19 mmol/L — ABNORMAL LOW (ref 22–32)
Calcium: 9.6 mg/dL (ref 8.9–10.3)
Chloride: 109 mmol/L (ref 98–111)
Creatinine, Ser: 1.21 mg/dL — ABNORMAL HIGH (ref 0.44–1.00)
GFR calc Af Amer: 53 mL/min — ABNORMAL LOW (ref >60)
GFR calc non Af Amer: 46 mL/min — ABNORMAL LOW (ref >60)
Glucose, Bld: 147 mg/dL — ABNORMAL HIGH (ref 70–99)
Potassium: 4.3 mmol/L (ref 3.5–5.1)
Sodium: 138 mmol/L (ref 135–145)

## 2019-06-24 LAB — GLUCOSE, CAPILLARY
Glucose-Capillary: 129 mg/dL — ABNORMAL HIGH (ref 70–99)
Glucose-Capillary: 143 mg/dL — ABNORMAL HIGH (ref 70–99)
Glucose-Capillary: 86 mg/dL (ref 70–99)
Glucose-Capillary: 131 mg/dL — ABNORMAL HIGH (ref 70–99)

## 2019-06-24 NOTE — Progress Notes (Signed)
Physical Therapy Weekly Progress Note  Patient Details  Name: Tammy Nunez MRN: [id number] Date of Birth: 1951/08/07  Beginning of progress report period: June 17, 2019 End of progress report period: June 24, 2019  Today's Date: 06/24/2019 PT Individual Time: 2633-3545 and 1136-1202 PT Individual Time Calculation (min): 75 min and 26 min   Patient has met 4 of 4 short term goals. Patient progressing well with therapy demonstrating improving trunk control/balance with decreased L lateral lean and improving L LE motor control/coordination. Patient continues to demonstrate L hemibody/hemienvironment inattention, impaired dynamic standing balance/trunk control during functional tasks and poor balance strategies for recovery after LOB.  Patient continues to demonstrate the following deficits muscle weakness, decreased cardiorespiratoy endurance, impaired timing and sequencing, abnormal tone, unbalanced muscle activation, decreased coordination and decreased motor planning, decreased midline orientation, decreased attention to left and decreased motor planning and decreased sitting balance, decreased standing balance, decreased postural control and decreased balance strategies and therefore will continue to benefit from skilled PT intervention to increase functional independence with mobility.  Patient making slow progress toward long term goals. See goal revisions.  Continue plan of care.  PT Short Term Goals Week 2:  PT Short Term Goal 1 (Week 2): Pt will perform supine<>sit with CGA PT Short Term Goal 1 - Progress (Week 2): Met PT Short Term Goal 2 (Week 2): Pt will perform bed<>chair transfers with min assist PT Short Term Goal 2 - Progress (Week 2): Met PT Short Term Goal 3 (Week 2): Pt will perform sit<>stands using LRAD with CGA PT Short Term Goal 3 - Progress (Week 2): Met PT Short Term Goal 4 (Week 2): Pt will ambulate 36f using RW with max assist PT Short Term Goal 4 - Progress (Week  2): Met Week 3:  PT Short Term Goal 1 (Week 3): = to LTGs based on ELOS  Skilled Therapeutic Interventions/Progress Updates:  Ambulation/gait training;Community reintegration;DME/adaptive equipment instruction;Neuromuscular re-education;Psychosocial support;Stair training;UE/LE Strength taining/ROM;Wheelchair propulsion/positioning;Balance/vestibular training;Discharge planning;Functional electrical stimulation;Pain management;Skin care/wound management;Therapeutic Activities;UE/LE Coordination activities;Cognitive remediation/compensation;Functional mobility training;Disease management/prevention;Patient/family education;Splinting/orthotics;Therapeutic Exercise;Visual/perceptual remediation/compensation   Session 1: Pt received supine in bed and agreeable to therapy session. Supine>sit, HOB flat but using bedrails, with CGA for trunk steadying when allowing pt increased time. Donned TED hose and shoes (with L toe cap) max assist and pants mod assist for time management. Sit<>stands with CGA for steadying throughout session - continues to require mod cuing for L UE placement on/off orthotic and for strapping in hand due to L inattention. Stand pivot transfer EOB>w/c, no AD, with min assist for balance and mod cuing for sequencing.  Transported to/from gym in w/c. Ambulated 558fx2 (seated break between) using RW with min/mod assist primarily for AD management and balance with min/mod cuing for L LE placement and intermittent use of external target with pt demonstrating improving foot placement and foot clearance without AFO or ACE wrap at this time until increased fatigue had more toe drag - with fatigue requires increased to consistent mod assist and increased cuing for AD management and LE stepping - noticed when therapists assists less with AD management then pt requires overall more assist for trunk control/balance due to R lateral lean. Pt requires mod assist for balance and AD management when turning to  sit at the end of ambulation with mod/max cuing for sequencing. Squat pivot w/c>EOM with CGA for steadying. Mirror feedback used for prSears Holdings CorporationPre-gait training without UE support focusing on NMR of R lateral weight shifting and trunk control to  step forward/backwards towards external target with L LE - CGA/min assist for balance with pt demonstrating L lean/LOB due to inability to maintain R weight shift when stepping L leg. Pre-gait training without UE support focusing on NMR and motor planning/sequencing for L weighshift to step forward/backwards with R LE towards external target with CGA/min assist and 3x mod assist due to L lean/LOB requiring increased cuing and time for recovery - cuing for sequencing and balance throughout. Standing>tall kneeling on mat table with +2 mod/max assist for balance and L LE placement onto mat. Tall kneeling on mat with B UE support on bench, therapist assisting with L UE support, with mod progressed to min assist for balance with cuing for increased upright trunk posture and L lateral weight shift as pt demonstrating hips swept to the R with trunk leaning L - max cuing for improvement. Performed repeated lowering hips onto L foot then returning to tall kneeling with quad/glute activation and mod/min assist for balance - progressed to no R UE support. Tall kneeling>sitting EOM with mod assist for pivoting and lowering hips. Stand pivot EOM>w/c with min assist for balance and mod cuing for sequencing. Returned to room in w/c and pt left sitting in w/c with needs in reach and seat belt alarm on.  Session 2: Pt received sitting in w/c and agreeable to therapy session. Performed R LE w/c propulsion ~114f with min assist to avoid obstacles on L and for fatigue towards end of propulsion - pt's L foot moved off of the leg rest and pt continued attempting to roll forward despite L LE moving under the w/c - pt education on L HB awareness and visually looking for placement prior  to continuing w/c propulsion - pt continues to require mod cuing for locking w/c prior to performing transfers. Blocked practice squat pivot transfers w/c<>EOM with supervision/CGA and x1 min assist due to pt not placing L LE in proper position prior to initiating transfer - education on proper set-up and L HB awareness prior to performing transfers. Sit<>stand with CGA for steadying throughout session. Ambulated ~359fusing RW with min/mod assist for balance and AD management - therapist assisting less with AD this session but pt continues to require mod/max cuing for proper management especially when turning. Ambulated 3236fithout AD with min/mod assist for balance and mod cuing for sequencing - pt continues to require intermittent external target cuing for proper L LE placement especially when turning - max cuing for sequencing of turning to sit in w/c. Transported back to room in w/c and pt left sitting in w/c with needs in reach and seat belt alarm on.  Therapy Documentation Precautions:  Precautions Precautions: Fall Precaution Comments: left hemiparesis Restrictions Weight Bearing Restrictions: No  Pain: Session 1: Reports her L leg was "achy" last when she was trying to go to sleep but no symptoms this morning.   Session 2: Denies pain during session.  Therapy/Group: Individual Therapy  CarTawana ScaleT, DPT 06/24/2019, 7:53 AM

## 2019-06-24 NOTE — Progress Notes (Signed)
[facility] PHYSICAL MEDICINE & REHABILITATION PROGRESS NOTE   Subjective/Complaints:    ROS- neg CP, SOB, N/V  Objective:   No results found. No results for input(s): WBC, HGB, HCT, PLT in the last 72 hours. Recent Labs    06/24/19 0632  NA 138  K 4.3  CL 109  CO2 19*  GLUCOSE 147*  BUN 33*  CREATININE 1.21*  CALCIUM 9.6    Intake/Output Summary (Last 24 hours) at 06/24/2019 4431 Last data filed at 06/23/2019 2116 Gross per 24 hour  Intake 804 ml  Output -  Net 804 ml     Physical Exam: Vital Signs Blood pressure (!) 146/73, pulse 84, temperature 98.7 F (37.1 C), temperature source Oral, resp. rate 18, height 5\' 2"  (1.575 m), weight 78 kg, SpO2 98 %.  General: No acute distress HEENT R upper lip sutures intact well approximated vermillion border with mild swelling  Mood and affect are appropriate Heart: Regular rate and rhythm no rubs murmurs or extra sounds Lungs: Clear to auscultation, breathing unlabored, no rales or wheezes Abdomen: Positive bowel sounds, soft nontender to palpation, nondistended Extremities: No clubbing, cyanosis, or edema Skin: No evidence of breakdown, no evidence of rash Neurologic: Cranial nerves II through XII intact, motor strength is 2-/5 in left , bicep, tricep,2- finger flexors , 2- hip flexor, knee extensors, ankle dorsiflexor and plantar flexor  Musculoskeletal: Full range of motion in all 4 extremities. No joint swelling    Assessment/Plan: 1. Functional deficits secondary to Left Hemi R MCA which require 3+ hours per day of interdisciplinary therapy in a comprehensive inpatient rehab setting.  Physiatrist is providing close team supervision and 24 hour management of active medical problems listed below.  Physiatrist and rehab team continue to assess barriers to discharge/monitor patient progress toward functional and medical goals  Care Tool:  Bathing    Body parts bathed by patient: Left arm, Chest, Abdomen, Front  perineal area, Buttocks, Right upper leg, Right lower leg, Left upper leg, Face   Body parts bathed by helper: Left lower leg, Right arm Body parts n/a: Right arm, Left arm, Chest, Abdomen, Left lower leg, Right lower leg, Face, Left upper leg, Right upper leg   Bathing assist Assist Level: Minimal Assistance - Patient > 75%     Upper Body Dressing/Undressing Upper body dressing Upper body dressing/undressing activity did not occur (including orthotics): Safety/medical concerns What is the patient wearing?: Pull over shirt    Upper body assist Assist Level: Moderate Assistance - Patient 50 - 74%    Lower Body Dressing/Undressing Lower body dressing      What is the patient wearing?: Pants, Incontinence brief     Lower body assist Assist for lower body dressing: Moderate Assistance - Patient 50 - 74%     Toileting Toileting    Toileting assist Assist for toileting: Moderate Assistance - Patient 50 - 74%(1/2 steps)     Transfers Chair/bed transfer  Transfers assist     Chair/bed transfer assist level: Minimal Assistance - Patient > 75%     Locomotion Ambulation   Ambulation assist      Assist level: Moderate Assistance - Patient 50 - 74% Assistive device: Walker-rolling Max distance: 23ft   Walk 10 feet activity   Assist     Assist level: Moderate Assistance - Patient - 50 - 74% Assistive device: Walker-rolling   Walk 50 feet activity   Assist Walk 50 feet with 2 turns activity did not occur: Safety/medical concerns  Walk 150 feet activity   Assist Walk 150 feet activity did not occur: Safety/medical concerns         Walk 10 feet on uneven surface  activity   Assist Walk 10 feet on uneven surfaces activity did not occur: Safety/medical concerns         Wheelchair     Assist Will patient use wheelchair at discharge?: Yes Type of Wheelchair: Manual    Wheelchair assist level: Minimal Assistance - Patient > 75% Max  wheelchair distance: 77ft    Wheelchair 50 feet with 2 turns activity    Assist        Assist Level: Minimal Assistance - Patient > 75%   Wheelchair 150 feet activity     Assist     Assist Level: Minimal Assistance - Patient > 75%    Medical Problem List and Plan: 1.Functional deficits and left hemiparesissecondary to right MCA distribution embolic infarcts Left sided strength improving  CIR PT, OT, SLP, will order left AFO 2. Antithrombotics: -DVT/anticoagulation:Pharmaceutical:Lovenox -antiplatelet therapy: ASA/Plavix 3. Pain Management:Tylenol prn 4. Mood:LCSW to follow for evaluation and support. -antipsychotic agents: N/A 5. Neuropsych: This patientiscapable of making decisions on herown behalf. 6. Skin/Wound Care:Routine pressure relief measuresRIght Upper lip   laceration sutures out 7. Fluids/Electrolytes/Nutrition:Monitor I/O. -I personally reviewed the patient's labs today. BUN elevated increase po fluids  8. T2DM: New diagnosis with Hgb A1c-9.3. Educate on Carb modified diet. Change Lantus/novolog to low dose metformin titrate upwardsas indicated. Will continue to use sliding scale insulin for elevated blood sugars. CBG (last 3)  Recent Labs    06/23/19 1647 06/23/19 2124 06/24/19 0623  GLUCAP 111* 118* 129*  CBGs generally controlled on glimipride  9. Bilateral carotid artery stenosis:Follow-up with vascular surgery after discharge 10. New onsetsimple partialseizures: Continue Keppra 1000 mg bid. 11. Morbid obesity: BMI31.0--educated patient on importance of weight loss to help with overall health and mobility. 12. HTN: Monitor BP tid--avoid hypotension.  Vitals:   06/23/19 2011 06/24/19 0506  BP: (!) 142/73 (!) 146/73  Pulse: 80 84  Resp: 18 18  Temp: 98.2 F (36.8 C) 98.7 F (37.1 C)  SpO2: 97% 98%   Zestril 20 mg/day, since 7/24 blood pressures controlled  7/31  13. Tobacco use: Continue to educate on importance of cessation.  14. Dyslipidemia:Continue statin #15 hypokalemia resolved,off  KCl.    16.  Loose stools improved on reduced metformin dose  LOS: 15 days A FACE TO FACE EVALUATION WAS PERFORMED  Charlett Blake 06/24/2019, 8:12 AM

## 2019-06-24 NOTE — Plan of Care (Signed)
  Problem: RH Balance Goal: LTG Patient will maintain dynamic standing balance (PT) Description: LTG:  Patient will maintain dynamic standing balance with assistance during mobility activities (PT) Flowsheets (Taken 06/24/2019 1857) LTG: Pt will maintain dynamic standing balance during mobility activities with:: (downgraded based on progress) Minimal Assistance - Patient > 75% Note: downgraded based on progress   Problem: RH Bed Mobility Goal: LTG Patient will perform bed mobility with assist (PT) Description: LTG: Patient will perform bed mobility with assistance, with/without cues (PT). Flowsheets (Taken 06/24/2019 1857) LTG: Pt will perform bed mobility with assistance level of: (downgraded based on progress) Contact Guard/Touching assist Note: downgraded based on progress   Problem: RH Car Transfers Goal: LTG Patient will perform car transfers with assist (PT) Description: LTG: Patient will perform car transfers with assistance (PT). Flowsheets (Taken 06/24/2019 1859) LTG: Pt will perform car transfers with assist:: (downgraded based on progress) Minimal Assistance - Patient > 75% Note: downgraded based on progress   Problem: RH Ambulation Goal: LTG Patient will ambulate in controlled environment (PT) Description: LTG: Patient will ambulate in a controlled environment, # of feet with assistance (PT). Flowsheets Taken 06/24/2019 1900 LTG: Pt will ambulate in controlled environ  assist needed:: (downgraded based on progress) Minimal Assistance - Patient > 75% Taken 06/10/2019 1936 LTG: Ambulation distance in controlled environment: 86ft using LRAD Note: downgraded based on progress Goal: LTG Patient will ambulate in home environment (PT) Description: LTG: Patient will ambulate in home environment, # of feet with assistance (PT). Flowsheets Taken 06/24/2019 1900 LTG: Pt will ambulate in home environ  assist needed:: (downgraded based on progress) Minimal Assistance - Patient > 75% Taken  06/10/2019 1936 LTG: Ambulation distance in home environment: 17ft using LRAD Note: downgraded based on progress

## 2019-06-24 NOTE — Progress Notes (Signed)
Social Work Patient ID: Tammy Nunez, female   DOB: 03/02/1951, 68 y.o.   MRN: [id number]  Met with pt and spoke briefly with husband via telephone to discuss team conference progress toward her goals and the need for education next week with husband prior to discharge home. He can be here around 10-11 on Tuesday, due to gets a ride from a friend. Will set up education at 10:30 to see if can be here by then. Aware pt will require 24 hr care at discharge from rehab.

## 2019-06-24 NOTE — Progress Notes (Signed)
Occupational Therapy Session Note  Patient Details  Name: Tammy Nunez MRN: [id number] Date of Birth: 11-20-1951  Today's Date: 06/24/2019 OT Individual Time: 1345-1445 OT Individual Time Calculation (min): 60 min    Short Term Goals: Week 3:  OT Short Term Goal 1 (Week 3): Continue working on established LTGs set at min assist overall.  Skilled Therapeutic Interventions/Progress Updates:    Pt received in w/c with orthotics rep present, donning new L AFO. Pt excited to walk and test out. Pt propelled w/c to therapy gym with hemi method, using mostly her RLE and requiring cueing for RUE to participate/push wheel correctly. Min A overall to maintain straight trajectory. Pt completed 10 ft of functional mobility with mod A ,requiring cueing for advancing LLE and facilitation for weight bearing. Pt transitioned into supine on mat. Attempted closed chain B shoulder raises but was unable to stabilize pt's LUE well enough. Ace-wrapped pt's LUE to the dowel and pt was then able to activate LUE with min trunk compensation. Pt then rolled into sidelying and attempted to transfer pt into quadroped. Pt was collapsing into the L side and was unable to maintain position with max A. Pt was instead placed in prone and worked on prone on elbows with scapular activation. Pt transitioned back to sitting EOM with mod A. Pt completed 15 ft of functional mobility back to chair with mod-max A and requiring manual facilitation for weightshift. Pt returned to room and was encouraged to change brief d/t odor present. Pt stood at sink with min A and completed posterior peri hygiene with min A. Pt reported needing to use bathroom more and completed SPT to the [facility] over toilet with use of grab bar with min A. Pt voided urine and then returned to w/c. She was left sitting up with the chair belt fastened and preferred TV station on.   Therapy Documentation Precautions:  Precautions Precautions: Fall Precaution Comments: left  hemiparesis Restrictions Weight Bearing Restrictions: No   Therapy/Group: Individual Therapy  Curtis Sites 06/24/2019, 7:19 AM

## 2019-06-24 NOTE — Progress Notes (Signed)
Speech Language Pathology Weekly Progress and Session Note  Patient Details  Name: Tammy Nunez MRN: [id number] Date of Birth: [birth year]  Beginning of progress report period: June 17, 2019 End of progress report period: June 24, 2019  Today's Date: 06/24/2019 SLP Individual Time: 1100-1130 SLP Individual Time Calculation (min): 30 min  Short Term Goals: Week 2: SLP Short Term Goal 1 (Week 2): Patient will demonstrate efficient mastication with complete oral clearance with trials of regular textures of 2 sessions prior to upgrade. SLP Short Term Goal 1 - Progress (Week 2): Met SLP Short Term Goal 2 (Week 2): Patient will demonstrate semi-complex problem solving for familiar tasks with Min A cues. SLP Short Term Goal 2 - Progress (Week 2): Progressing toward goal SLP Short Term Goal 3 (Week 2): Patient will self-monitor and correct errors during functional tasks with Min A cues. SLP Short Term Goal 3 - Progress (Week 2): Progressing toward goal SLP Short Term Goal 4 (Week 2): Patient will scan to left field of enviornment during functional tasks with Mod A verbal cues. SLP Short Term Goal 4 - Progress (Week 2): Progressing toward goal    New Short Term Goals: Week 3: SLP Short Term Goal 1 (Week 3): Patient will demonstrate semi-complex problem solving for familiar tasks with Min A cues. SLP Short Term Goal 2 (Week 3): Patient will self-monitor and correct errors during functional tasks with Min A cues. SLP Short Term Goal 3 (Week 3): Patient will scan to left field of enviornment during functional tasks with Mod A verbal cues.  Weekly Progress Updates: Pt has made progress this reporting period and as a result she is successfully consuming regular diet textures with Mod I. Pt has also made progress towards cognition goals but continues to require Mod A assist for emergent awareness and semi-complex problem solving. As such, skilled ST continues to be indicated to increase pt's functional  independence and reduce caregiver burden.      Intensity: Minumum of 1-2 x/day, 30 to 90 minutes Frequency: 3 to 5 out of 7 days Duration/Length of Stay: 2.5-3 weeks Treatment/Interventions: Cognitive remediation/compensation;Dysphagia/aspiration precaution training;Therapeutic Activities;Environmental controls;Cueing hierarchy;Functional tasks;Patient/family education;Internal/external aids   Daily Session  Skilled Therapeutic Interventions: Skilled treatment session focused on cognition goals. SLP facilitated session by providing Mod A cues to problem solve and scan to left of midline while playing semi-complex version of Connect 4. Pt with specific difficulty preventing opponent from getting 4 checkers in row. Pt left upright in wheelchair, lap belt alarm on and all needs within reach. Continue per current plan of care.       General    Pain Pain Assessment Pain Scale: 0-10 Pain Score: 0-No pain  Therapy/Group: Individual Therapy    06/24/2019, 12:45 PM

## 2019-06-25 ENCOUNTER — Inpatient Hospital Stay (HOSPITAL_COMMUNITY): Payer: PPO | Admitting: Physical Therapy

## 2019-06-25 DIAGNOSIS — E119 Type 2 diabetes mellitus without complications: Secondary | ICD-10-CM

## 2019-06-25 DIAGNOSIS — R569 Unspecified convulsions: Secondary | ICD-10-CM

## 2019-06-25 DIAGNOSIS — I10 Essential (primary) hypertension: Secondary | ICD-10-CM

## 2019-06-25 DIAGNOSIS — R799 Abnormal finding of blood chemistry, unspecified: Secondary | ICD-10-CM

## 2019-06-25 LAB — GLUCOSE, CAPILLARY
Glucose-Capillary: 161 mg/dL — ABNORMAL HIGH (ref 70–99)
Glucose-Capillary: 125 mg/dL — ABNORMAL HIGH (ref 70–99)
Glucose-Capillary: 79 mg/dL (ref 70–99)
Glucose-Capillary: 189 mg/dL — ABNORMAL HIGH (ref 70–99)

## 2019-06-25 NOTE — Progress Notes (Signed)
Physical Therapy Session Note  Patient Details  Name: Tammy Nunez MRN: [id number] Date of Birth: February 11, 1951  Today's Date: 06/25/2019 PT Individual Time: 0814-4818 PT Individual Time Calculation (min): 47 min   Short Term Goals: Week 3:  PT Short Term Goal 1 (Week 3): = to LTGs based on ELOS  Skilled Therapeutic Interventions/Progress Updates:    Pt received supine in bed and agreeable to therapy session. Pt's LLE posterior leaf spring AFO arrived yesterday after PT session.  Supine>sit, HOB partially elevated and using bedrails, with min assist for trunk upright and min/mod cuing for sequencing. Sitting EOB donned pants, BLE TED hose, shoes, and L LE AFO max assist for time management. Sit<>stands with CGA for steadying throughout - when using RW continues to require mod cuing and mod assist for L UE placement on/off RW orthotic and min assist for balance while strapping in hand due to R lateral lean. Right stand pivot EOB>w/c, no AD, with mod assist for balance and mod cuing for sequencing. Transported to/from gym in w/c for time management. Ambulated ~66ft x2 using RW with varying min/mod assist for balance throughout due to pt intermittently over weightshiting to R causing minor lean/LOB - therapist assisting less with AD management while walking but pt requires max cuing to attend and proper manage it - pt requires max cuing for sequential stepping and mod use of external target cuing for L LE placement while walking - pt continues to walk with decreased gait speed - AFO demonstrates improved L LE foot clearance during swing phase. Squat pivot w/c>EOM with CGA for steadying and min/mod cuing for proper set-up prior to transfer. Performed dynamic standing balance task of cross body reaching with R UE to grasp and place clothespins on basketball goal, no UE support, - mod to max assist for balance throughout. Squat pivot EOM>w/c with CGA for steadying. Transported pt back to room and left sitting  in w/c with L UE support, seat belt alarm on, and needs in reach.    Therapy Documentation Precautions:  Precautions Precautions: Fall Precaution Comments: left hemiparesis Restrictions Weight Bearing Restrictions: No  Pain: Reports some L arm discomfort last night but denies pain now.    Therapy/Group: Individual Therapy  Tawana Scale, PT, DPT 06/25/2019, 7:49 AM

## 2019-06-25 NOTE — Progress Notes (Signed)
[facility] PHYSICAL MEDICINE & REHABILITATION PROGRESS NOTE   Subjective/Complaints: Patient seen laying in bed this morning.  She states she slept well overnight.  ROS: Denies CP, SOB, N/V  Objective:   No results found. No results for input(s): WBC, HGB, HCT, PLT in the last 72 hours. Recent Labs    06/24/19 0632  NA 138  K 4.3  CL 109  CO2 19*  GLUCOSE 147*  BUN 33*  CREATININE 1.21*  CALCIUM 9.6    Intake/Output Summary (Last 24 hours) at 06/25/2019 0959 Last data filed at 06/24/2019 2300 Gross per 24 hour  Intake 596 ml  Output -  Net 596 ml     Physical Exam: Vital Signs Blood pressure (!) 119/52, pulse 78, temperature 98.2 F (36.8 C), temperature source Oral, resp. rate 16, height 5\' 2"  (1.575 m), weight 78 kg, SpO2 96 %. Constitutional: No distress . Vital signs reviewed. HENT: Poor dentition Eyes: EOMI. No discharge. Cardiovascular: No JVD. Respiratory: Normal effort. GI: Non-distended. Musc: No edema or tenderness in extremities. Skin: No evidence of breakdown, no evidence of rash Neurologic: Alert Motor: LUE: 3/5 proximal to distal LLE: 3/5 proximal distal    Assessment/Plan: 1. Functional deficits secondary to Left Hemi R MCA which require 3+ hours per day of interdisciplinary therapy in a comprehensive inpatient rehab setting.  Physiatrist is providing close team supervision and 24 hour management of active medical problems listed below.  Physiatrist and rehab team continue to assess barriers to discharge/monitor patient progress toward functional and medical goals  Care Tool:  Bathing    Body parts bathed by patient: Left arm, Chest, Abdomen, Front perineal area, Buttocks, Right upper leg, Right lower leg, Left upper leg, Face   Body parts bathed by helper: Left lower leg, Right arm Body parts n/a: Right arm, Left arm, Chest, Abdomen, Left lower leg, Right lower leg, Face, Left upper leg, Right upper leg   Bathing assist Assist Level:  Minimal Assistance - Patient > 75%     Upper Body Dressing/Undressing Upper body dressing Upper body dressing/undressing activity did not occur (including orthotics): Safety/medical concerns What is the patient wearing?: Pull over shirt    Upper body assist Assist Level: Moderate Assistance - Patient 50 - 74%    Lower Body Dressing/Undressing Lower body dressing      What is the patient wearing?: Pants, Incontinence brief     Lower body assist Assist for lower body dressing: Moderate Assistance - Patient 50 - 74%     Toileting Toileting    Toileting assist Assist for toileting: Moderate Assistance - Patient 50 - 74%(1/2 steps)     Transfers Chair/bed transfer  Transfers assist     Chair/bed transfer assist level: Contact Guard/Touching assist     Locomotion Ambulation   Ambulation assist      Assist level: Moderate Assistance - Patient 50 - 74% Assistive device: Walker-rolling Max distance: 2ft   Walk 10 feet activity   Assist     Assist level: Moderate Assistance - Patient - 50 - 74% Assistive device: Walker-rolling   Walk 50 feet activity   Assist Walk 50 feet with 2 turns activity did not occur: Safety/medical concerns  Assist level: Moderate Assistance - Patient - 50 - 74% Assistive device: Walker-rolling    Walk 150 feet activity   Assist Walk 150 feet activity did not occur: Safety/medical concerns         Walk 10 feet on uneven surface  activity   Assist Walk 10  feet on uneven surfaces activity did not occur: Safety/medical concerns         Wheelchair     Assist Will patient use wheelchair at discharge?: Yes Type of Wheelchair: Manual    Wheelchair assist level: Minimal Assistance - Patient > 75% Max wheelchair distance: 155ft    Wheelchair 50 feet with 2 turns activity    Assist        Assist Level: Minimal Assistance - Patient > 75%   Wheelchair 150 feet activity     Assist     Assist Level:  Minimal Assistance - Patient > 75%    Medical Problem List and Plan: 1.Functional deficits and left hemiparesissecondary to right MCA distribution embolic infarcts  Continue CIR 2. DVT/anticoagulation:Pharmaceutical:Lovenox -antiplatelet therapy: ASA/Plavix 3. Pain Management:Tylenol prn 4. Mood:LCSW to follow for evaluation and support. -antipsychotic agents: N/A 5. Neuropsych: This patientiscapable of making decisions on herown behalf. 6. Skin/Wound Care:Routine pressure relief measuresRIght Upper lip   laceration sutures out  7. Fluids/Electrolytes/Nutrition:Monitor I/O. BUN elevated on 7/31  Encourage fluids  Labs ordered for Monday 8. T2DM: New diagnosis with Hgb A1c-9.3.  Has been educated on carb modified diet.  Changed Lantus/novolog to orals and titrate upwardsas indicated. Will continue to use sliding scale insulin for elevated blood sugars. CBG (last 3)  Recent Labs    06/24/19 1634 06/24/19 2143 06/25/19 0622  GLUCAP 86 131* 161*   Metformin DC'd  Continue glimepiride  CBGs labile on 8/1, monitor for trend 9. Bilateral carotid artery stenosis:Follow-up with vascular surgery after discharge 10. New onsetsimple partialseizures: Continue Keppra 1000 mg bid.  No reported issues on medication 11.  Obesity: BMI31.0   Educate patient on importance of weight loss to help with overall health and mobility. 12. HTN: Monitor BP tid--avoid hypotension.  Vitals:   06/24/19 2147 06/25/19 0611  BP: 134/61 (!) 119/52  Pulse: 82 78  Resp: 16 16  Temp: 98.7 F (37.1 C) 98.2 F (36.8 C)  SpO2: 97% 96%    Zestril 20 mg/day  Stable with medications on 8/1 13. Tobacco use: Continue to educate on importance of cessation.  14. Dyslipidemia:Continue statin  LOS: 16 days A FACE TO FACE EVALUATION WAS PERFORMED   Lorie Phenix 06/25/2019, 9:59 AM

## 2019-06-26 ENCOUNTER — Inpatient Hospital Stay (HOSPITAL_COMMUNITY): Payer: PPO | Admitting: Physical Therapy

## 2019-06-26 DIAGNOSIS — R0989 Other specified symptoms and signs involving the circulatory and respiratory systems: Secondary | ICD-10-CM

## 2019-06-26 DIAGNOSIS — R7309 Other abnormal glucose: Secondary | ICD-10-CM

## 2019-06-26 LAB — GLUCOSE, CAPILLARY
Glucose-Capillary: 152 mg/dL — ABNORMAL HIGH (ref 70–99)
Glucose-Capillary: 112 mg/dL — ABNORMAL HIGH (ref 70–99)
Glucose-Capillary: 90 mg/dL (ref 70–99)
Glucose-Capillary: 170 mg/dL — ABNORMAL HIGH (ref 70–99)

## 2019-06-26 NOTE — Progress Notes (Signed)
[place] [facility] PHYSICAL MEDICINE & REHABILITATION PROGRESS NOTE   Subjective/Complaints: Patient seen laying in bed this morning.  She states she slept well overnight.  She denies complaints.  ROS: Denies CP, SOB, N/V  Objective:   No results found. No results for input(s): WBC, HGB, HCT, PLT in the last 72 hours. Recent Labs    06/24/19 0632  NA 138  K 4.3  CL 109  CO2 19*  GLUCOSE 147*  BUN 33*  CREATININE 1.21*  CALCIUM 9.6    Intake/Output Summary (Last 24 hours) at 06/26/2019 0953 Last data filed at 06/25/2019 2000 Gross per 24 hour  Intake 360 ml  Output -  Net 360 ml     Physical Exam: Vital Signs Blood pressure 124/62, pulse 75, temperature 98 F (36.7 C), temperature source Oral, resp. rate 14, height 5\' 2"  (1.575 m), weight 78 kg, SpO2 96 %. Constitutional: No distress . Vital signs reviewed. HENT: Poor dentition Eyes: EOMI.  No discharge. Cardiovascular: No JVD. Respiratory: Normal effort. GI: Non-distended. Musc: No edema or tenderness in extremities. Skin: No evidence of breakdown, no evidence of rash Neurologic: Alert Motor: LUE: 3+/5 proximal to distal LLE: 3+/5 proximal distal  Psych: Normal mood.  Normal affect.   Assessment/Plan: 1. Functional deficits secondary to Left Hemi R MCA which require 3+ hours per day of interdisciplinary therapy in a comprehensive inpatient rehab setting.  Physiatrist is providing close team supervision and 24 hour management of active medical problems listed below.  Physiatrist and rehab team continue to assess barriers to discharge/monitor patient progress toward functional and medical goals  Care Tool:  Bathing    Body parts bathed by patient: Left arm, Chest, Abdomen, Front perineal area, Buttocks, Right upper leg, Right lower leg, Left upper leg, Face   Body parts bathed by helper: Left lower leg, Right arm Body parts n/a: Right arm, Left arm, Chest, Abdomen, Left lower leg, Right lower leg, Face, Left upper  leg, Right upper leg   Bathing assist Assist Level: Minimal Assistance - Patient > 75%     Upper Body Dressing/Undressing Upper body dressing Upper body dressing/undressing activity did not occur (including orthotics): Safety/medical concerns What is the patient wearing?: Pull over shirt    Upper body assist Assist Level: Moderate Assistance - Patient 50 - 74%    Lower Body Dressing/Undressing Lower body dressing      What is the patient wearing?: Pants, Incontinence brief     Lower body assist Assist for lower body dressing: Moderate Assistance - Patient 50 - 74%     Toileting Toileting    Toileting assist Assist for toileting: Moderate Assistance - Patient 50 - 74%(1/2 steps)     Transfers Chair/bed transfer  Transfers assist     Chair/bed transfer assist level: Contact Guard/Touching assist     Locomotion Ambulation   Ambulation assist      Assist level: Moderate Assistance - Patient 50 - 74% Assistive device: Walker-rolling Max distance: 42ft   Walk 10 feet activity   Assist     Assist level: Moderate Assistance - Patient - 50 - 74% Assistive device: Walker-rolling   Walk 50 feet activity   Assist Walk 50 feet with 2 turns activity did not occur: Safety/medical concerns  Assist level: Moderate Assistance - Patient - 50 - 74% Assistive device: Walker-rolling    Walk 150 feet activity   Assist Walk 150 feet activity did not occur: Safety/medical concerns         Walk 10 feet  on uneven surface  activity   Assist Walk 10 feet on uneven surfaces activity did not occur: Safety/medical concerns         Wheelchair     Assist Will patient use wheelchair at discharge?: Yes Type of Wheelchair: Manual    Wheelchair assist level: Minimal Assistance - Patient > 75% Max wheelchair distance: 11ft    Wheelchair 50 feet with 2 turns activity    Assist        Assist Level: Minimal Assistance - Patient > 75%   Wheelchair  150 feet activity     Assist     Assist Level: Minimal Assistance - Patient > 75%    Medical Problem List and Plan: 1.Functional deficits and left hemiparesissecondary to right MCA distribution embolic infarcts  Continue CIR 2. DVT/anticoagulation:Pharmaceutical:Lovenox -antiplatelet therapy: ASA/Plavix 3. Pain Management:Tylenol prn 4. Mood:LCSW to follow for evaluation and support. -antipsychotic agents: N/A 5. Neuropsych: This patientiscapable of making decisions on herown behalf. 6. Skin/Wound Care:Routine pressure relief measuresRIght Upper lip   laceration sutures out  7. Fluids/Electrolytes/Nutrition:Monitor I/O. BUN elevated on 7/31  Encourage fluids  Labs ordered for tomorrow 8. T2DM: New diagnosis with Hgb A1c-9.3.  Has been educated on carb modified diet.  Changed Lantus/novolog to orals and titrate upwardsas indicated. Will continue to use sliding scale insulin for elevated blood sugars. CBG (last 3)  Recent Labs    06/25/19 1654 06/25/19 2101 06/26/19 0623  GLUCAP 79 189* 152*   Metformin DC'd  Continue glimepiride  Labile on 8/2, monitor for trend 9. Bilateral carotid artery stenosis:Follow-up with vascular surgery after discharge 10. New onsetsimple partialseizures: Continue Keppra 1000 mg bid.  No reported issues on medication to date 43.  Obesity: BMI31.0   Educate patient on importance of weight loss to help with overall health and mobility. 12. HTN: Monitor BP tid--avoid hypotension.  Vitals:   06/26/19 0603 06/26/19 0756  BP: (!) 119/56 124/62  Pulse: 75   Resp: 14   Temp: 98 F (36.7 C)   SpO2: 96%     Zestril 20 mg/day  Slightly labile on 8/2 13. Tobacco use: Continue to educate on importance of cessation.  14. Dyslipidemia:Continue statin  LOS: 17 days A FACE TO FACE EVALUATION WAS PERFORMED   Lorie Phenix 06/26/2019, 9:53 AM

## 2019-06-26 NOTE — Progress Notes (Signed)
Physical Therapy Session Note  Patient Details  Name: Tammy Nunez MRN: [id number] Date of Birth: 02/07/1951  Today's Date: 06/26/2019 PT Individual Time: 0904-1001 PT Individual Time Calculation (min): 57 min   Short Term Goals: Week 3:  PT Short Term Goal 1 (Week 3): = to LTGs based on ELOS  Skilled Therapeutic Interventions/Progress Updates:    Pt received supine in bed and agreeable to therapy session. Supine>sit, HOB partially elevated and using bedrails, with CGA for trunk steadying. Pt requesting to don new shirt and pants. Sitting EOB UB and LB clothing with max assist for time management - donned TED hose, shoes, and L LE AFO. Sit<>stands with CGA for steadying during session - when using RW continues to require mod/max cuing for L UE placement on RW orthotic and min assist for balance while strapping in hand. Stand pivot EOB>w/c, no AD, with mod assist for balance and max cuing for R LE stepping.  Transported to/from gym in w/c. Ambulated 74ft using RW with min assist for balance and 2x max assist for balance due to pt not brining L LE foot through fully during swing phase resulting in anterior LOB - therapist only assisting occasionally with AD management but pt requires max cuing for AD management and stepping sequencing. +2 mod assist for going from standing into tall kneeling on mat table with UE support on bench. In tall kneeling worked on L lateral weight shifting combined with L hip extension as pt continues to demonstrate R lateral weight shift with hip flexion mimicking a wind swept posture - progressed to sustained L weight shift with hip extension without UE support but pt requires mod assist to maintain position. Mod assist for pivoting hips to sit on EOM. Dynamic standing balance task without UE support focusing on trunk and L LE NMR to grasp and place clothespins on basketball goal with L lateral weight shift and mod/max manual facilitation/assist for L LE hip/knee extension and  L weight shift - mirror feedback for posture - mod/max assist for balance. Squat pivot EOM>w/c with supervision - pt educated on performing this technique of transfer at home for increased independence/safety and decrease caregiver burden. Transported back to room in w/c and pt left sitting in w/c with needs in reach and seat belt alarm on.  Therapy Documentation Precautions:  Precautions Precautions: Fall Precaution Comments: left hemiparesis Restrictions Weight Bearing Restrictions: No  Pain: Denies pain during session.   Therapy/Group: Individual Therapy  Tawana Scale, PT, DPT 06/26/2019, 7:48 AM

## 2019-06-27 ENCOUNTER — Inpatient Hospital Stay (HOSPITAL_COMMUNITY): Payer: PPO | Admitting: Occupational Therapy

## 2019-06-27 ENCOUNTER — Inpatient Hospital Stay (HOSPITAL_COMMUNITY): Payer: PPO | Admitting: Rehabilitation

## 2019-06-27 ENCOUNTER — Inpatient Hospital Stay (HOSPITAL_COMMUNITY): Payer: PPO

## 2019-06-27 DIAGNOSIS — N179 Acute kidney failure, unspecified: Secondary | ICD-10-CM

## 2019-06-27 LAB — GLUCOSE, CAPILLARY
Glucose-Capillary: 159 mg/dL — ABNORMAL HIGH (ref 70–99)
Glucose-Capillary: 103 mg/dL — ABNORMAL HIGH (ref 70–99)
Glucose-Capillary: 94 mg/dL (ref 70–99)
Glucose-Capillary: 159 mg/dL — ABNORMAL HIGH (ref 70–99)

## 2019-06-27 LAB — BASIC METABOLIC PANEL
Anion gap: 8 (ref 5–15)
BUN: 34 mg/dL — ABNORMAL HIGH (ref 8–23)
CO2: 19 mmol/L — ABNORMAL LOW (ref 22–32)
Calcium: 9.2 mg/dL (ref 8.9–10.3)
Chloride: 111 mmol/L (ref 98–111)
Creatinine, Ser: 1.29 mg/dL — ABNORMAL HIGH (ref 0.44–1.00)
GFR calc Af Amer: 49 mL/min — ABNORMAL LOW (ref >60)
GFR calc non Af Amer: 43 mL/min — ABNORMAL LOW (ref >60)
Glucose, Bld: 163 mg/dL — ABNORMAL HIGH (ref 70–99)
Potassium: 4.2 mmol/L (ref 3.5–5.1)
Sodium: 138 mmol/L (ref 135–145)

## 2019-06-27 LAB — CBC
HCT: 39.4 % (ref 36.0–46.0)
Hemoglobin: 13.2 g/dL (ref 12.0–15.0)
MCH: 30.9 pg (ref 26.0–34.0)
MCHC: 33.5 g/dL (ref 30.0–36.0)
MCV: 92.3 fL (ref 80.0–100.0)
Platelets: 174 10*3/uL (ref 150–400)
RBC: 4.27 MIL/uL (ref 3.87–5.11)
RDW: 12.7 % (ref 11.5–15.5)
WBC: 4 10*3/uL (ref 4.0–10.5)
nRBC: 0 % (ref 0.0–0.2)

## 2019-06-27 NOTE — Progress Notes (Signed)
Occupational Therapy Session Note  Patient Details  Name: Tammy Nunez MRN: [id number] Date of Birth: Jun 26, 1951  Today's Date: 06/27/2019 OT Individual Time: 0459-9774 OT Individual Time Calculation (min): 75 min    Short Term Goals: Week 3:  OT Short Term Goal 1 (Week 3): Continue working on established LTGs set at min assist overall.  Skilled Therapeutic Interventions/Progress Updates:    Treatment session with focus on dynamic standing balance, Lt attention, and LUE NMR  Pt received upright in w/c declining bathing/dressing but agreeable to therapy session. Engaged in sit > stand with min assist to CGA to engage in visual scanning to locate numbers and letters on vertical surface.  Pt required mod cues to turn head and scan to Lt. Engaged in table top task with focus on visual scanning to complete puzzle.  Pt required mod cues faded to min as puzzle neared finishing.  Engaged in towel glides with tactile cues and target to increase range.  Noted increased tone in elbow (flexor tone) requiring cues and manual facilitation to achieve full elbow extension.  Engaged in functional reaching and WB through LUE to facilitate increased range.  Provided pt with Self-ROM handout and educated on shoulder flexion/extension, abduction/adduction, and internal/external rotation.  Pt will continue to benefit from education on HEP.  Pt remained upright in w/c with seat belt alarm on and all needs in reach.  Therapy Documentation Precautions:  Precautions Precautions: Fall Precaution Comments: left hemiparesis Restrictions Weight Bearing Restrictions: No General:   Vital Signs: Therapy Vitals Temp: 97.6 F (36.4 C) Temp Source: Oral Pulse Rate: 82 Resp: 17 BP: 132/61 Patient Position (if appropriate): Sitting Oxygen Therapy SpO2: 98 % O2 Device: Room Air Pain: Pain Assessment Pain Score: 0-No pain   Therapy/Group: Individual Therapy  Simonne Come 06/27/2019, 3:43 PM

## 2019-06-27 NOTE — Progress Notes (Signed)
[facility] PHYSICAL MEDICINE & REHABILITATION PROGRESS NOTE   Subjective/Complaints: Patient seen laying in bed this morning.  She states she slept well overnight.  She is more interactive this morning.  ROS: Denies CP, SOB, N/V  Objective:   No results found. Recent Labs    06/27/19 0656  WBC 4.0  HGB 13.2  HCT 39.4  PLT 174   Recent Labs    06/27/19 0656  NA 138  K 4.2  CL 111  CO2 19*  GLUCOSE 163*  BUN 34*  CREATININE 1.29*  CALCIUM 9.2    Intake/Output Summary (Last 24 hours) at 06/27/2019 1059 Last data filed at 06/27/2019 0900 Gross per 24 hour  Intake 660 ml  Output -  Net 660 ml     Physical Exam: Vital Signs Blood pressure 124/60, pulse 77, temperature 98.4 F (36.9 C), temperature source Oral, resp. rate 18, height 5\' 2"  (1.575 m), weight 77.1 kg, SpO2 97 %. Constitutional: No distress . Vital signs reviewed. HENT: Poor dentition Eyes: EOMI.  No discharge. Cardiovascular: No JVD. Respiratory: Normal effort. GI: Non-distended. Musc: No edema or tenderness in extremities. Skin: Warm and dry.  Intact. Neurologic: Alert Motor: LUE: Shoulder abduction 2/5, elbow flexion/extension 3-/5, handgrip 1+/5  LLE: 4/5 proximal distal  Psych: Normal mood.  Normal affect.   Assessment/Plan: 1. Functional deficits secondary to Left Hemi R MCA which require 3+ hours per day of interdisciplinary therapy in a comprehensive inpatient rehab setting.  Physiatrist is providing close team supervision and 24 hour management of active medical problems listed below.  Physiatrist and rehab team continue to assess barriers to discharge/monitor patient progress toward functional and medical goals  Care Tool:  Bathing    Body parts bathed by patient: Left arm, Chest, Abdomen, Front perineal area, Buttocks, Right upper leg, Right lower leg, Left upper leg, Face   Body parts bathed by helper: Left lower leg, Right arm Body parts n/a: Right arm, Left arm, Chest, Abdomen,  Left lower leg, Right lower leg, Face, Left upper leg, Right upper leg   Bathing assist Assist Level: Minimal Assistance - Patient > 75%     Upper Body Dressing/Undressing Upper body dressing Upper body dressing/undressing activity did not occur (including orthotics): Safety/medical concerns What is the patient wearing?: Pull over shirt    Upper body assist Assist Level: Moderate Assistance - Patient 50 - 74%    Lower Body Dressing/Undressing Lower body dressing      What is the patient wearing?: Pants, Incontinence brief     Lower body assist Assist for lower body dressing: Moderate Assistance - Patient 50 - 74%     Toileting Toileting    Toileting assist Assist for toileting: Moderate Assistance - Patient 50 - 74%(1/2 steps)     Transfers Chair/bed transfer  Transfers assist     Chair/bed transfer assist level: Contact Guard/Touching assist     Locomotion Ambulation   Ambulation assist      Assist level: Minimal Assistance - Patient > 75% Assistive device: Walker-rolling Max distance: 59ft   Walk 10 feet activity   Assist     Assist level: Minimal Assistance - Patient > 75% Assistive device: Walker-rolling   Walk 50 feet activity   Assist Walk 50 feet with 2 turns activity did not occur: Safety/medical concerns  Assist level: Minimal Assistance - Patient > 75% Assistive device: Walker-rolling    Walk 150 feet activity   Assist Walk 150 feet activity did not occur: Safety/medical concerns  Walk 10 feet on uneven surface  activity   Assist Walk 10 feet on uneven surfaces activity did not occur: Safety/medical concerns         Wheelchair     Assist Will patient use wheelchair at discharge?: Yes Type of Wheelchair: Manual    Wheelchair assist level: Minimal Assistance - Patient > 75% Max wheelchair distance: 14ft    Wheelchair 50 feet with 2 turns activity    Assist        Assist Level: Minimal  Assistance - Patient > 75%   Wheelchair 150 feet activity     Assist     Assist Level: Minimal Assistance - Patient > 75%    Medical Problem List and Plan: 1.Functional deficits and left hemiparesissecondary to right MCA distribution embolic infarcts  Continue CIR 2. DVT/anticoagulation:Pharmaceutical:Lovenox -antiplatelet therapy: ASA/Plavix 3. Pain Management:Tylenol prn 4. Mood:LCSW to follow for evaluation and support. -antipsychotic agents: N/A 5. Neuropsych: This patientiscapable of making decisions on herown behalf. 6. Skin/Wound Care:Routine pressure relief measuresRIght Upper lip   laceration sutures out  7. Fluids/Electrolytes/Nutrition:Monitor I/O. 8. T2DM: New diagnosis with Hgb A1c-9.3.  Has been educated on carb modified diet.  Changed Lantus/novolog to orals and titrate upwardsas indicated. Will continue to use sliding scale insulin for elevated blood sugars. CBG (last 3)  Recent Labs    06/26/19 1723 06/26/19 2059 06/27/19 0605  GLUCAP 90 170* 159*   Metformin DC'd  Continue glimepiride  Labile on 8/3, monitor for trend 9. Bilateral carotid artery stenosis:Follow-up with vascular surgery after discharge 10. New onsetsimple partialseizures: Continue Keppra 1000 mg bid.  No reported issues on medication since admission to rehab to date 24.  Obesity: BMI31.0   Educate patient on importance of weight loss to help with overall health and mobility. 12. HTN: Monitor BP tid--avoid hypotension.  Vitals:   06/26/19 2001 06/27/19 0452  BP: 140/67 124/60  Pulse: 81 77  Resp: 18 18  Temp: 98.5 F (36.9 C) 98.4 F (36.9 C)  SpO2: 99% 97%    Zestril 20 mg/day  Relatively stable on 8/3 13. Tobacco use: Continue to educate on importance of cessation.  14. Dyslipidemia:Continue statin 15.  AKI  Creatinine 1.29 on 8/3  Encourage fluids LOS: 18 days A FACE TO FACE EVALUATION WAS PERFORMED   Lorie Phenix 06/27/2019, 10:59 AM

## 2019-06-27 NOTE — Progress Notes (Signed)
Speech Language Pathology Daily Session Note  Patient Details  Name: Tammy Nunez MRN: [id number] Date of Birth: 05-25-1951  Today's Date: 06/27/2019 SLP Individual Time: 0900-0958 SLP Individual Time Calculation (min): 58 min  Short Term Goals: Week 3: SLP Short Term Goal 1 (Week 3): Patient will demonstrate semi-complex problem solving for familiar tasks with Min A cues. SLP Short Term Goal 2 (Week 3): Patient will self-monitor and correct errors during functional tasks with Min A cues. SLP Short Term Goal 3 (Week 3): Patient will scan to left field of enviornment during functional tasks with Mod A verbal cues.  Skilled Therapeutic Interventions: Skilled ST services focused on cognitive skills. SLP facilitated visual tracking, left scanning and sustained attention in 1 letter cancellation tasks, pt demonstrated recall of requested letter, required extensive time to complete task requiring mod A verbal cues with 70% accuracy. Pt demonstrated errors in left field as well as canceling out several letters that were not requested and required cuing to only cancel a letter once, demonstrating continued visual perceptional deficits and reduced awareness. Pt required mod A verbal cues for verbal sequencing tasks. SLP facilitated sem-complex problem solving skills in demonstrating auditory comprehension of short paragraphs and making inference given statements, pt demonstrated 1/5 accuracy on initial two paragraphs and increased accuracy to 3/5 on last two paragraphs. Pt was left in room with call bell within reach and bed alarm set. ST recommends to continue skilled ST services.      Pain Pain Assessment Pain Scale: 0-10 Pain Score: 0-No pain  Therapy/Group: Individual Therapy  Donnie Panik  [facility] 06/27/2019, 2:01 PM

## 2019-06-27 NOTE — Progress Notes (Signed)
Physical Therapy Session Note  Patient Details  Name: Tammy Nunez MRN: [id number] Date of Birth: Apr 08, 1951  Today's Date: 06/27/2019 PT Individual Time: 1002-1045 PT Individual Time Calculation (min): 43 min   Short Term Goals: Week 1:  PT Short Term Goal 1 (Week 1): Pt will perform supine<>sit with mod assist PT Short Term Goal 1 - Progress (Week 1): Met PT Short Term Goal 2 (Week 1): Pt will perform sit<>stand with min assist PT Short Term Goal 2 - Progress (Week 1): Met PT Short Term Goal 3 (Week 1): Pt will perform bed<>chair transfers with min assist PT Short Term Goal 3 - Progress (Week 1): Progressing toward goal PT Short Term Goal 4 (Week 1): Pt will ambulate 44f using LRAD with max assist PT Short Term Goal 4 - Progress (Week 1): Progressing toward goal Week 2:  PT Short Term Goal 1 (Week 2): Pt will perform supine<>sit with CGA PT Short Term Goal 1 - Progress (Week 2): Met PT Short Term Goal 2 (Week 2): Pt will perform bed<>chair transfers with min assist PT Short Term Goal 2 - Progress (Week 2): Met PT Short Term Goal 3 (Week 2): Pt will perform sit<>stands using LRAD with CGA PT Short Term Goal 3 - Progress (Week 2): Met PT Short Term Goal 4 (Week 2): Pt will ambulate 382fusing RW with max assist PT Short Term Goal 4 - Progress (Week 2): Met Week 3:  PT Short Term Goal 1 (Week 3): = to LTGs based on ELOS  Skilled Therapeutic Interventions/Progress Updates:   Pt received lying in bed, agreeable to therapy session.  Performed bed mobility with HOB at 30 deg at min A.  Pt did well rolling, however needed min A to elevate trunk into sitting.  Pt also did very well scooting to EOB.  Assisted with donning pants and shoes/AFO for time management.  Transferred to w/c via stand pivot with RW at min/mod A level with step by step cues for LLE due to proprioceptive/coordination deficits.  Had pt self propel w/c using R UE/LE at min A level x 125' with cues for scanning left  environment and utilizing R foot to assist in steering.  Once in therapy gym, transferred to mat via stand pivot with PT only with continued cues for weight shift and foot placement, however noted improvement from transfer in room.  Performed standing task tapping RLE to target on aerobic step for improved LLE activation and improved postural control.  Utilized miLawyeror posture and adequate L lateral weight shift.  Performed x 10 reps.  Also performed L LE tapping to target x 10 reps, again with cues for upright posture and weight shift to the R.  LiLattie Hawrecreation therapist, assisted in remainder of session, therefore transitioned to tall kneeling with +2 A for safety facing mat stepping knees to mat with KaUlis Riasench.  While in tall kneeling, had pt reach upward and to the center and R for improved L proximal muscle activation/trunk activation (x 15 reps).  PT providing tactile cues at pelvis for protraction and also at L UE for shoulder ER, elbow extension and wrist extension while reaching.  Transitioned to quadruped with PT providing support at axilla to control WB through arm, elbow to enforce extension and wrist.  Cues and tactile facilitation for R lateral weight shift while performing ant/post weight shift x 15 reps.  Pt assisted back into sitting on mat.  Transferred to w/c and assisted to room.  Left in w/c in room with alarm set and all needs in reach.   Therapy Documentation Precautions:  Precautions Precautions: Fall Precaution Comments: left hemiparesis Restrictions Weight Bearing Restrictions: No   Pain: No pain noted during session    Therapy/Group: Individual Therapy  Denice Bors 06/27/2019, 1:00 PM

## 2019-06-27 NOTE — Progress Notes (Signed)
Recreational Therapy Session Note  Patient Details  Name: Tammy Nunez MRN: [id number] Date of Birth: Aug 17, 1951 Today's Date: 06/27/2019 Time:   1015-1045 Pain: no c/o Skilled Therapeutic Interventions/Progress Updates:   Session focused on activity tolerance, tall kneeling and weight shifting during co-treat with PT.  Pt required +2 assist to get into tall kneeling position on the mat.  Once in tall kneeling, pt performed reaching task reaching up and to the center or to the right x 15.  Assisted pt into quadruped with +2 assist for WBing through LUE, verbal and tactile cues for weight distribution and weight shifting. Therapy/Group: Co-treatment Keamber Macfadden 06/27/2019, 3:10 PM

## 2019-06-28 ENCOUNTER — Encounter (HOSPITAL_COMMUNITY): Payer: PPO | Admitting: Occupational Therapy

## 2019-06-28 ENCOUNTER — Inpatient Hospital Stay (HOSPITAL_COMMUNITY): Payer: PPO | Admitting: Occupational Therapy

## 2019-06-28 ENCOUNTER — Encounter (HOSPITAL_COMMUNITY): Payer: PPO

## 2019-06-28 ENCOUNTER — Ambulatory Visit (HOSPITAL_COMMUNITY): Payer: PPO | Admitting: Physical Therapy

## 2019-06-28 LAB — GLUCOSE, CAPILLARY
Glucose-Capillary: 158 mg/dL — ABNORMAL HIGH (ref 70–99)
Glucose-Capillary: 139 mg/dL — ABNORMAL HIGH (ref 70–99)
Glucose-Capillary: 99 mg/dL (ref 70–99)
Glucose-Capillary: 143 mg/dL — ABNORMAL HIGH (ref 70–99)

## 2019-06-28 NOTE — Progress Notes (Signed)
Physical Therapy Session Note  Patient Details  Name: Tammy Nunez MRN: [id number] Date of Birth: 1951/03/09  Today's Date: 06/28/2019 PT Individual Time: 5701-7793 PT Individual Time Calculation (min): 59 min   Short Term Goals: Week 3:  PT Short Term Goal 1 (Week 3): = to LTGs based on ELOS  Skilled Therapeutic Interventions/Progress Updates:   Pt received transferring from toilet to EOB in stedy with nursing staff and nurse tech requesting to bladder scan pt. Sit>supine in bed with min assist for L UE management and cuing for attention and placement of L LE. Supine>sit using bedrails with CGA for steadying. Stand pivot EOB>w/c using RW with min assist for balance and max cuing for L UE placement on RW orthotic and for using R hand to strap in L as well as max cuing for L LE stepping/positioning during transfer. Pt continues to demonstrate significant L hemibody inattention during session. Transported to/from gym in w/c. Ambulated 62ft using RW with min assist progressed to mod assist for balance with 2x anterior LOB due to L foot not coming through fully during swing - continues to require max cuing for AD management while ambulating but only very intermittent assist as well as max cuing for sequencing of turning with AD and LE stepping. Stepped up/down 6" step leading with R LE using R handrail with mod assist then performed leading with L LE with mod assist for balance/lifting - max cuing for sequencing. Progressed to stepping up/down 3" step leading with L LE but without UE support with mod progressed to min assist for balance due to L lateral lean/LOB - cuing for balance recovery without using R UE to assist. Squat pivot EOM<>w/c with close supervision and set-up assist. Performed dynamic standing balance task without UE support while reaching outside BOS to grasp and toss horseshoes with CGA/min assist for balance. Transported back to room in w/c and pt left sitting in w/c with needs in reach  and seat belt alarm on.  Therapy Documentation Precautions:  Precautions Precautions: Fall Precaution Comments: left hemiparesis Restrictions Weight Bearing Restrictions: No  Pain: Denies pain during session.   Therapy/Group: Individual Therapy  Tawana Scale, PT, DPT 06/28/2019, 3:40 PM

## 2019-06-28 NOTE — Progress Notes (Signed)
Recreational Therapy Session Note  Patient Details  Name: Ange Puskas MRN: [id number] Date of Birth: 1950-12-04 Today's Date: 06/28/2019 Time:  1030 Pain: no c/o Skilled Therapeutic Interventions/Progress Updates: Session focused on discharge planning.  Discussion included use of leisure time, activity modifications, review of relaxation techniques and delegating tasks and/or acceptance of help from members of her support team.  Pt had questions about adaptive equipment she may need at discharge.  Educated pt that therapy team will be making recommendations and SW would order recommended equipment prior to discharge with pt agreement.  Pt states she will discuss with her therapists this afternoon during family education sessions with her husband.    Therapy/Group: Individual Therapy  Paden Senger 06/28/2019, 11:36 AM

## 2019-06-28 NOTE — Progress Notes (Signed)
Speech Language Pathology Daily Session Note  Patient Details  Name: Marialuiza Car MRN: [id number] Date of Birth: 06/07/1951  Today's Date: 06/28/2019 SLP Individual Time: 3646-8032 SLP Individual Time Calculation (min): 57 min  Short Term Goals: Week 3: SLP Short Term Goal 1 (Week 3): Patient will demonstrate semi-complex problem solving for familiar tasks with Min A cues. SLP Short Term Goal 2 (Week 3): Patient will self-monitor and correct errors during functional tasks with Min A cues. SLP Short Term Goal 3 (Week 3): Patient will scan to left field of enviornment during functional tasks with Mod A verbal cues.  Skilled Therapeutic Interventions: Skilled ST services focused on education and cognitive skills. SLP facilitated semi-complex verbal problem solving skills in which pt provided logical solutions to household problems, pt required min A verbal cues. Pt's husband was unable to attend session for education, due to car trouble. SLP assisted pt with mod A (anticipatory awareness and semi-complex problem solving) to cooridnate a new tomorrow ride for husband to attend education sessions for OT/PT. Pt's husband is unable to be present for SLP treatment session tomorrow, therefore SLP provided education via phone and all questions were answered to satisfaction. Pt was left in room with call bell within reach and chair alarm set. ST recommends to continue skilled ST services.      Pain Pain Assessment Pain Scale: Faces Pain Score: 0-No pain  Therapy/Group: Individual Therapy  Roosevelt Eimers  [facility] 06/28/2019, 3:50 PM

## 2019-06-28 NOTE — Progress Notes (Signed)
Occupational Therapy Session Note  Patient Details  Name: Tammy Nunez MRN: [id number] Date of Birth: [birth year]  Today's Date: 06/28/2019 OT Individual Time: 1302-1402 OT Individual Time Calculation (min): 60 min    Short Term Goals: Week 3:  OT Short Term Goal 1 (Week 3): Continue working on established LTGs set at min assist overall.  Skilled Therapeutic Interventions/Progress Updates:    Pt completed functional mobility to the walk-in shower with mod assist and use of the RW and AFO on the right foot.  She was able to complete doffing LB clothing with mod assist as well as UB before working on showering.  Wash mitt was utilized for functional use of the LUE to wash the right arm as well as other parts with overall max hand over hand assist.  She was able to stand for washing her peri area with mod assist and mod instructional cueing for placement and attention to the left foot.  She was then able to complete LB dressing sit to stand at the sink with mod assist as well as UB dressing.  She continues to demonstrate left visual field deficit as well as perceptual deficits that make orientation of clothing difficult.  Finished session with pt in the wheelchair at bedside with SLP in for next session.  Plan for family education next session.      Therapy Documentation Precautions:  Precautions Precautions: Fall Precaution Comments: left hemiparesis Restrictions Weight Bearing Restrictions: No  Pain: Pain Assessment Pain Scale: Faces ADL: See Care Tool Section for some details of ADL  Therapy/Group: Individual Therapy  Cassandra Harbold OTR/L 06/28/2019, 3:49 PM

## 2019-06-28 NOTE — Plan of Care (Signed)
  Problem: RH Dressing Goal: LTG Patient will perform upper body dressing (OT) Description: LTG Patient will perform upper body dressing with assist, with/without cues (OT). Flowsheets (Taken 06/28/2019 1652) LTG: Pt will perform upper body dressing with assistance level of: (Goal downgraded based on progress) Minimal Assistance - Patient > 75% Goal: LTG Patient will perform lower body dressing w/assist (OT) Description: LTG: Patient will perform lower body dressing with assist, with/without cues in positioning using equipment (OT) Flowsheets (Taken 06/28/2019 1652) LTG: Pt will perform lower body dressing with assistance level of: (Goal downgraded based on progress) Moderate Assistance - Patient 50 - 74%   Problem: RH Tub/Shower Transfers Goal: LTG Patient will perform tub/shower transfers w/assist (OT) Description: LTG: Patient will perform tub/shower transfers with assist, with/without cues using equipment (OT) Flowsheets (Taken 06/28/2019 1652) LTG: Pt will perform tub/shower stall transfers with assistance level of: (Goal downgraded based on progress) Moderate Assistance - Patient 50 - 74% LTG: Pt will perform tub/shower transfers from: Tub/shower combination

## 2019-06-28 NOTE — Progress Notes (Signed)
[facility] PHYSICAL MEDICINE & REHABILITATION PROGRESS NOTE   Subjective/Complaints: Patient seen laying in bed this AM.  She slept well overnight.    ROS: Denies CP, SOB, N/V  Objective:   No results found. Recent Labs    06/27/19 0656  WBC 4.0  HGB 13.2  HCT 39.4  PLT 174   Recent Labs    06/27/19 0656  NA 138  K 4.2  CL 111  CO2 19*  GLUCOSE 163*  BUN 34*  CREATININE 1.29*  CALCIUM 9.2    Intake/Output Summary (Last 24 hours) at 06/28/2019 1126 Last data filed at 06/28/2019 0901 Gross per 24 hour  Intake 480 ml  Output -  Net 480 ml     Physical Exam: Vital Signs Blood pressure (!) 135/56, pulse 71, temperature 98.6 F (37 C), temperature source Oral, resp. rate 17, height 5\' 2"  (1.575 m), weight 75.5 kg, SpO2 97 %. Constitutional: No distress . Vital signs reviewed. HENT: Normocephalic.  Atraumatic. Poor dentition.  Eyes: EOMI. No discharge. Cardiovascular: No JVD. Respiratory: Normal effort.  No stridor. GI: Non-distended. Skin: Warm and dry.  Intact. Psych: Normal mood.  Normal behavior. Musc: No edema.  No tenderness. Neurologic: Alert  Motor: LUE: Shoulder abduction 4-/5, elbow flexion/extension 2+/5, handgrip 1/5  LLE: 4-4+/5 proximal distal  Psych: Normal mood.  Normal affect.   Assessment/Plan: 1. Functional deficits secondary to Left Hemi R MCA which require 3+ hours per day of interdisciplinary therapy in a comprehensive inpatient rehab setting.  Physiatrist is providing close team supervision and 24 hour management of active medical problems listed below.  Physiatrist and rehab team continue to assess barriers to discharge/monitor patient progress toward functional and medical goals  Care Tool:  Bathing    Body parts bathed by patient: Left arm, Chest, Abdomen, Front perineal area, Buttocks, Right upper leg, Right lower leg, Left upper leg, Face   Body parts bathed by helper: Left lower leg, Right arm Body parts n/a: Right arm, Left  arm, Chest, Abdomen, Left lower leg, Right lower leg, Face, Left upper leg, Right upper leg   Bathing assist Assist Level: Minimal Assistance - Patient > 75%     Upper Body Dressing/Undressing Upper body dressing Upper body dressing/undressing activity did not occur (including orthotics): Safety/medical concerns What is the patient wearing?: Pull over shirt    Upper body assist Assist Level: Moderate Assistance - Patient 50 - 74%    Lower Body Dressing/Undressing Lower body dressing      What is the patient wearing?: Pants, Incontinence brief     Lower body assist Assist for lower body dressing: Moderate Assistance - Patient 50 - 74%     Toileting Toileting    Toileting assist Assist for toileting: Moderate Assistance - Patient 50 - 74%(1/2 steps)     Transfers Chair/bed transfer  Transfers assist     Chair/bed transfer assist level: Minimal Assistance - Patient > 75%     Locomotion Ambulation   Ambulation assist      Assist level: Minimal Assistance - Patient > 75% Assistive device: Walker-rolling Max distance: 74ft   Walk 10 feet activity   Assist     Assist level: Minimal Assistance - Patient > 75% Assistive device: Walker-rolling   Walk 50 feet activity   Assist Walk 50 feet with 2 turns activity did not occur: Safety/medical concerns  Assist level: Minimal Assistance - Patient > 75% Assistive device: Walker-rolling    Walk 150 feet activity   Assist Walk 150 feet  activity did not occur: Safety/medical concerns         Walk 10 feet on uneven surface  activity   Assist Walk 10 feet on uneven surfaces activity did not occur: Safety/medical concerns         Wheelchair     Assist Will patient use wheelchair at discharge?: Yes Type of Wheelchair: Manual    Wheelchair assist level: Minimal Assistance - Patient > 75% Max wheelchair distance: 136ft    Wheelchair 50 feet with 2 turns activity    Assist         Assist Level: Minimal Assistance - Patient > 75%   Wheelchair 150 feet activity     Assist     Assist Level: Minimal Assistance - Patient > 75%    Medical Problem List and Plan: 1.Functional deficits and left hemiparesissecondary to right MCA distribution embolic infarcts  Continue CIR 2. DVT/anticoagulation:Pharmaceutical:Lovenox -antiplatelet therapy: ASA/Plavix 3. Pain Management:Tylenol prn 4. Mood:LCSW to follow for evaluation and support. -antipsychotic agents: N/A 5. Neuropsych: This patientiscapable of making decisions on herown behalf. 6. Skin/Wound Care:Routine pressure relief measuresRIght Upper lip   laceration sutures out  7. Fluids/Electrolytes/Nutrition:Monitor I/O. 8. T2DM: New diagnosis with Hgb A1c-9.3.  Has been educated on carb modified diet.  Changed Lantus/novolog to orals and titrate upwardsas indicated. Will continue to use sliding scale insulin for elevated blood sugars. CBG (last 3)  Recent Labs    06/27/19 1639 06/27/19 2135 06/28/19 0616  GLUCAP 94 159* 158*   Metformin DC'd  Continue glimepiride  Labile on 8/4, monitor for trend 9. Bilateral carotid artery stenosis:Follow-up with vascular surgery after discharge 10. New onsetsimple partialseizures: Continue Keppra 1000 mg bid.  No reported issues on medication since admission to rehab to date 19.  Obesity: BMI31.0   Educate patient on importance of weight loss to help with overall health and mobility. 12. HTN: Monitor BP tid--avoid hypotension.  Vitals:   06/27/19 2046 06/28/19 0530  BP: (!) 161/61 (!) 135/56  Pulse: 73 71  Resp: 17 17  Temp: 98.5 F (36.9 C) 98.6 F (37 C)  SpO2: 99% 97%    Zestril 20 mg/day  Labile on 8/4 13. Tobacco use: Continue to educate on importance of cessation.  14. Dyslipidemia:Continue statin 15.  AKI  Creatinine 1.29 on 8/3  Encourage fluids LOS: 19 days A FACE TO FACE EVALUATION WAS  PERFORMED   Lorie Phenix 06/28/2019, 11:26 AM

## 2019-06-28 NOTE — Progress Notes (Signed)
Occupational Therapy Session Note  Patient Details  Name: Jarita Raval MRN: [id number] Date of Birth: 01-Dec-1950  Today's Date: 06/28/2019 OT Individual Time: 3662-9476 OT Individual Time Calculation (min): 46 min    Short Term Goals: Week 3:  OT Short Term Goal 1 (Week 3): Continue working on established LTGs set at min assist overall.  Skilled Therapeutic Interventions/Progress Updates:    Pt transferred supine to sit EOB with min assist and then worked on donning pants and shoes with overall mod assist.  She then completed stand pivot transfer from the bed to the wheelchair on the right side.  She then worked on stand pivot transfers to the tub bench with use of the walker and without down in the tubshower room.  She completed stand pivot transfers with min assist using the RW as well as without but needed mod instructional cueing for technique and attention to the left side including orientation of her body to the surface she is transferring to.  Once finished she completed neuromuscular re-education of the LUE with use of the ergonometer.  She completed 1 set of 4 mins using BUEs with the left hand ace bandaged to the handle for sustained grip.  She completed a 2nd set for 3 mins with isolated LUE use and overall mod assist to complete rotations.  Resistance was set at level 3 for both intervals.  Finished session with transfer back to the room via wheelchair with pt left sitting up in the chair with call button and phone in reach and safety belt in place.    Therapy Documentation Precautions:  Precautions Precautions: Fall Precaution Comments: left hemiparesis Restrictions Weight Bearing Restrictions: No  Pain: Pain Assessment Pain Scale: Faces Faces Pain Scale: No hurt ADL: See Care Tool Section for some details of ADL  Therapy/Group: Individual Therapy  Shamaria Kavan OTR/L 06/28/2019, 12:29 PM

## 2019-06-28 NOTE — Progress Notes (Signed)
Social Work Patient ID: Tammy Nunez, female   DOB: 1951/07/25, 68 y.o.   MRN: [id number]     Diagnosis codes:I63.511 & E11.9  Height:  5'2          Weight:  165 lbs          Patient suffers from R-CVA which impairs her ability to perform daily activities like ADL's and tolieting   in the home.  A walker  will not resolve issue with performing activities of daily living.  A wheelchair will allow patient to safely perform daily activities.  Patient is not able to propel themselves in the home using a standard weight wheelchair due to endurance and fatigue .  Patient can self propel in the lightweight wheelchair.

## 2019-06-29 ENCOUNTER — Inpatient Hospital Stay (HOSPITAL_COMMUNITY): Payer: PPO | Admitting: Speech Pathology

## 2019-06-29 ENCOUNTER — Inpatient Hospital Stay: Admission: RE | Admit: 2019-06-29 | Payer: PPO | Source: Home / Self Care | Admitting: Vascular Surgery

## 2019-06-29 ENCOUNTER — Inpatient Hospital Stay (HOSPITAL_COMMUNITY): Payer: PPO

## 2019-06-29 ENCOUNTER — Encounter (HOSPITAL_COMMUNITY): Payer: PPO | Admitting: Occupational Therapy

## 2019-06-29 ENCOUNTER — Encounter: Admission: RE | Payer: Self-pay | Source: Home / Self Care

## 2019-06-29 ENCOUNTER — Ambulatory Visit (HOSPITAL_COMMUNITY): Payer: PPO

## 2019-06-29 DIAGNOSIS — E1165 Type 2 diabetes mellitus with hyperglycemia: Secondary | ICD-10-CM

## 2019-06-29 DIAGNOSIS — E118 Type 2 diabetes mellitus with unspecified complications: Secondary | ICD-10-CM

## 2019-06-29 LAB — GLUCOSE, CAPILLARY
Glucose-Capillary: 125 mg/dL — ABNORMAL HIGH (ref 70–99)
Glucose-Capillary: 168 mg/dL — ABNORMAL HIGH (ref 70–99)
Glucose-Capillary: 110 mg/dL — ABNORMAL HIGH (ref 70–99)
Glucose-Capillary: 178 mg/dL — ABNORMAL HIGH (ref 70–99)

## 2019-06-29 SURGERY — CAROTID PTA/STENT INTERVENTION
Anesthesia: Moderate Sedation | Laterality: Right

## 2019-06-29 MED ORDER — HYDRALAZINE HCL 10 MG PO TABS: 10.0000 mg | ORAL_TABLET | Freq: Three times a day (TID) | ORAL | Status: DC | PRN

## 2019-06-29 NOTE — Progress Notes (Signed)
Social Work Patient ID: Tammy Nunez, female   DOB: Dec 20, 1950, 68 y.o.   MRN: [id number]  Met with pt and husband when here to inform of team conference goals min assist level and discharge still Friday. He feels he can provide the care she requires at home. Will make referrals and order equipment for home. Plan for discharge Friday.

## 2019-06-29 NOTE — Progress Notes (Addendum)
Physical Therapy Session Note  Patient Details  Name: Tammy Nunez MRN: [id number] Date of Birth: 01/26/1951  Today's Date: 06/29/2019 PT Individual Time: 0900-1000; 1530-1600 PT Individual Time Calculation (min): 60 min , 60 min  Short Term Goals:  Week 3:  PT Short Term Goal 1 (Week 3): = to LTGs based on ELOS  Skilled Therapeutic Interventions/Progress Updates:  tx 1:  Husband [name] and sister [name] here for family ed.    PT started donning pt's pants with pt in supine.  Pt rolled L and sat up with min assist. PT donned L AFO and bil shoes for time mgt. Husband observed.  Sit> stand to RW iwht close supervision.  Husband pulled pants up with pt in standing.  Stand pivot transfer with RW, mod assist due to LOB R due to erratic, long L step.    W/c propulsion using hemi technique x 50' with mod cues for steering, pillow behind back to improve R heel-to-floor.  Simulated car transfer x 1 with PT with min assist, RW, max cues for sequencing.  PT educated husband on folding /unfolding w/c and cushion care.  Simulated car transfer with husband assisting pt, with min assist and max cues from PT for pt and husband for sequeincing and safety.  Husband would benefit from further ed to learn the exact, directional cues to give pt regarding LLE stepping during transfers.  Pt ambulated over carpet in ADL room with husband, RW, mod cues needed by PT for use of hand splint on RW and technique.   tx 2:  Pt sitting in w/c.  She denies pain.   neuromuscular re-education via forced use, mulitmodal cues for alternanating reciprocal movement bil LEs from w/c level, (LAFO doffed)  using kInetron at resistance 50 cm/sec.  20 cycles  X 2 targeting gluteal muscles, 20 cycles x 1 targeting quadriceps muscles.  Using LLE unilaterally, x 20 cycles at same resistance with good activation of  L gluteal muscles.  Pt requested getting back to bed.  Sit> stand with CGa, min assist stand pivot without use of RW  or LAFO.  Sit> supine with extra time and min cues.  At end of session, pt resting in bed with alarm set and needs at hand.         Therapy Documentation Precautions:  Precautions Precautions: Fall Precaution Comments: left hemiparesis Restrictions Weight Bearing Restrictions: No   Pain: Pain Assessment Pain Scale: 0-10 Pain Score: 0-No pain       Therapy/Group: Individual Therapy  , 06/29/2019, 10:43 AM

## 2019-06-29 NOTE — Progress Notes (Signed)
Recreational Therapy Discharge Summary Patient Details  Name: [name] MRN: [id number] Date of Birth: 24-Sep-1951 Today's Date: 06/29/2019  Long term goals set: 1  Long term goals met: 1  Comments on progress toward goals: Pt has made good progress during LOS and met supervision level for simple TR tasks seated.  Pt does require set up and verbal cues for scanning and attention at times.  Education provided on use of leisure time at discharge, activity analysis with identification of adaptations, & relaxation training.  Pt is excited about upcoming discharge and feels pt and family will be able to provide the assistance she needs.  Goal met. Reasons for discharge: discharge from hospital  Patient/family agrees with progress made and goals achieved: Yes  Sanvika Cuttino 06/29/2019, 11:59 AM

## 2019-06-29 NOTE — Progress Notes (Signed)
Occupational Therapy Session Note  Patient Details  Name: Tammy Nunez MRN: [id number] Date of Birth: 1951-11-19  Today's Date: 06/29/2019 OT Individual Time: 1001-1104 OT Individual Time Calculation (min): 63 min    Short Term Goals: Week 3:  OT Short Term Goal 1 (Week 3): Continue working on established LTGs set at min assist overall.  Skilled Therapeutic Interventions/Progress Updates:    Pt completed bathing and dressing sit to stand with her spouse and sister present.  She completed stand pivot transfer with min assist to the tub bench.  Once on the seat, she needed mod assist for removal of clothing prior to shower.  Max assist to donn wash mit on the LUE with max facilitation to use it to wash the right arm, left and right upper legs as well as the right lower leg by crossing it over the left knee.  Pt completed sit to stand and standing balance with min assist for washing peri area and buttocks.  Pt's spouse assisted with hand over hand when integrating the LUE for bathing as well as well as assisting with stand pivot back to the wheelchair from the shower seat and for sit to stand during dressing.  Mod instructional cueing for following hemi dressing techniques. Overall mod assist for UB and LB dressing secondary to visual perceptual deficits.  Discussed with family at end of session self AAROM exercises that pt had been educated on as well as handout, but did not get to have family actually assist.  Feel it would be good to have another session of education but family probably not able to come.  Pt left in the wheelchair at end of session with call button and phone in reach and safety belt in place.    Therapy Documentation Precautions:  Precautions Precautions: Fall Precaution Comments: left hemiparesis Restrictions Weight Bearing Restrictions: No   Pain: Pain Assessment Pain Scale: Faces Pain Score: 0-No pain ADL: See Care Tool Section for some details of  ADL  Therapy/Group: Individual Therapy  , OTR/L 06/29/2019, 12:16 PM

## 2019-06-29 NOTE — Patient Care Conference (Signed)
Inpatient RehabilitationTeam Conference and Plan of Care Update Date: 06/29/2019   Time: 11:05 AM    Patient Name: Tammy Nunez      Medical Record Number: [id number]  Date of Birth: 28-Jul-1951 Sex: Female         Room/Bed: 4W19C/4W19C-01 Payor Info: Payor: [employer] / Plan: [employer] Must PPO / Product Type: *No Product type* /    Admitting Diagnosis: 4. CVA 2 Team  RT. CVA; 16-19days  Admit Date/Time:  06/09/2019  2:32 PM Admission Comments: No comment available   Primary Diagnosis:  <principal problem not specified> Principal Problem: <principal problem not specified>  Patient Active Problem List   Diagnosis Date Noted  . Type 2 diabetes mellitus with hyperglycemia, without long-term current use of insulin ([place])   . AKI (acute kidney injury) ([place])   . Labile blood pressure   . Labile blood glucose   . Essential hypertension   . Seizures ([place])   . New onset type 2 diabetes mellitus ([place])   . Elevated BUN   . Acute ischemic right MCA stroke ([place]) 06/09/2019  . Acute CVA (cerebrovascular accident) ([place]) 06/06/2019    Expected Discharge Date: Expected Discharge Date: 07/01/19  Team Members Present: Physician leading conference: Dr. Delice Lesch Social Worker Present: Lennart Pall, LCSW;Jenny Prevatt, LCSW;Becky DuPree, LCSW Nurse Present: Dorthula Nettles, RN PT Present: Georjean Mode, PT OT Present: Clyda Greener, OT SLP Present: Stormy Fabian, SLP PPS Coordinator present : Gunnar Fusi, SLP     Current Status/Progress Goal Weekly Team Focus  Medical   Functional deficits and left hemiparesis secondary to right MCA distribution embolic infarcts  Improve mobility, safety, DM/HTN, AKI  See above   Bowel/Bladder   Con. of b/b  remain con. of B/b      Swallow/Nutrition/ Hydration   reg/thin Mod I  Mod I - goal met      ADL's   min assist for UB bathing with mod assist for UB dressing.  Min assist for LB bathing sit to stand with mod assist for LB  dressing.  Stand pivot transfers with min assist as well as squat pivot. Still with Brunnstrum stage IV movement in the left arm with stage III in the hand.  Still with left neglect and field cut as well as motor planning deficits with dressing tasks.  Min assist overall  selfcare retraining, balance retraining, transfer training, neuromuscular re-education, therapeutic exercise, pt/family education   Mobility   CGA bed mobility, CGA sit<>stands and min assist stand pivots bed<>chair, mod assist gait 74f using RW  CGA bed mobility, supervision sit<>stand and bed<>chair transfers, min assist gait 572f neuromuscular re-education, balance, L attention and awareness, gait training, education, discharge planning   Communication             Safety/Cognition/ Behavioral Observations  Mod-Min A, should meet goals  Min A - downgraded 7/24 and 7/28  education was provided, semi-complex problem solving , emergent awareness and left scanning/strategies for visual deficits   Pain   No c/o pain at this time.  Remain free of pain.      Skin   Masd to the groin.  Keep clean and dry and apply barrier cream.         *See Care Plan and progress notes for long and short-term goals.     Barriers to Discharge  Current Status/Progress Possible Resolutions Date Resolved   Physician    Medical stability     See above  Therapies, optimize DM/HTN meds, encourage  fluids, follow labs      Nursing                  PT                    OT                  SLP                SW                Discharge Planning/Teaching Needs:  Husband coming in today for education in prepartion for DC Friday. See how it goes.      Team Discussion:  CBGs vary; AKI.  Cont b/b.  Min assist stand -pivot tfs;  B/d at min/mod assist.  Visual - percep deficits noted;  Min A with toilet tfs.  Left UE with some improved fxn but still hand over hand with b/d.  Inattention to the left.  fam ed today but will need more time.     Revisions to Treatment Plan:  NA    Continued Need for Acute Rehabilitation Level of Care: The patient requires daily medical management by a physician with specialized training in physical medicine and rehabilitation for the following conditions: Daily direction of a multidisciplinary physical rehabilitation program to ensure safe treatment while eliciting the highest outcome that is of practical value to the patient.: Yes Daily medical management of patient stability for increased activity during participation in an intensive rehabilitation regime.: Yes Daily analysis of laboratory values and/or radiology reports with any subsequent need for medication adjustment of medical intervention for : Neurological problems;Blood pressure problems;Diabetes problems;Renal problems   I attest that I was present, lead the team conference, and concur with the assessment and plan of the team.   Lennart Pall 06/29/2019, 3:24 PM    Team conference was held via web/ teleconference due to [place] - 47

## 2019-06-29 NOTE — Progress Notes (Signed)
[facility] PHYSICAL MEDICINE & REHABILITATION PROGRESS NOTE   Subjective/Complaints: Patient seen laying in bed this morning.  She states she slept well overnight.  ROS: Denies CP, SOB, N/V  Objective:   No results found. Recent Labs    06/27/19 0656  WBC 4.0  HGB 13.2  HCT 39.4  PLT 174   Recent Labs    06/27/19 0656  NA 138  K 4.2  CL 111  CO2 19*  GLUCOSE 163*  BUN 34*  CREATININE 1.29*  CALCIUM 9.2    Intake/Output Summary (Last 24 hours) at 06/29/2019 1344 Last data filed at 06/29/2019 0900 Gross per 24 hour  Intake 499 ml  Output -  Net 499 ml     Physical Exam: Vital Signs Blood pressure 125/64, pulse 74, temperature 98.3 F (36.8 C), resp. rate 19, height 5\' 2"  (1.575 m), weight 75.5 kg, SpO2 97 %. Constitutional: No distress . Vital signs reviewed. HENT: Normocephalic.  Atraumatic. Eyes: EOMI. No discharge. Cardiovascular: No JVD. Respiratory: Normal effort.  No stridor. GI: Non-distended. Skin: Warm and dry.  Intact. Psych: Normal mood.  Normal behavior. Musc: No edema.  No tenderness. Neurologic: Alert  Motor: LUE: Shoulder abduction 4-/5, elbow flexion/extension 2+/5, handgrip 1/5, stable LLE: 4-4+/5 proximal distal, stable Increase in tone noted  Assessment/Plan: 1. Functional deficits secondary to Left Hemi R MCA which require 3+ hours per day of interdisciplinary therapy in a comprehensive inpatient rehab setting.  Physiatrist is providing close team supervision and 24 hour management of active medical problems listed below.  Physiatrist and rehab team continue to assess barriers to discharge/monitor patient progress toward functional and medical goals  Care Tool:  Bathing    Body parts bathed by patient: Left arm, Chest, Abdomen, Front perineal area, Buttocks, Right upper leg, Right lower leg, Left upper leg, Face   Body parts bathed by helper: Left lower leg, Right arm Body parts n/a: Right arm, Left arm, Chest, Abdomen, Left lower  leg, Right lower leg, Face, Left upper leg, Right upper leg   Bathing assist Assist Level: Minimal Assistance - Patient > 75%     Upper Body Dressing/Undressing Upper body dressing Upper body dressing/undressing activity did not occur (including orthotics): Safety/medical concerns What is the patient wearing?: Pull over shirt    Upper body assist Assist Level: Moderate Assistance - Patient 50 - 74%    Lower Body Dressing/Undressing Lower body dressing      What is the patient wearing?: Pants, Incontinence brief     Lower body assist Assist for lower body dressing: Moderate Assistance - Patient 50 - 74%     Toileting Toileting    Toileting assist Assist for toileting: Moderate Assistance - Patient 50 - 74%(1/2 steps)     Transfers Chair/bed transfer  Transfers assist     Chair/bed transfer assist level: Moderate Assistance - Patient 50 - 74%     Locomotion Ambulation   Ambulation assist      Assist level: Minimal Assistance - Patient > 75% Assistive device: Orthosis Max distance: 20   Walk 10 feet activity   Assist     Assist level: Minimal Assistance - Patient > 75% Assistive device: Walker-rolling, Orthosis   Walk 50 feet activity   Assist Walk 50 feet with 2 turns activity did not occur: Safety/medical concerns  Assist level: Moderate Assistance - Patient - 50 - 74% Assistive device: Walker-rolling    Walk 150 feet activity   Assist Walk 150 feet activity did not occur: Safety/medical concerns  Walk 10 feet on uneven surface  activity   Assist Walk 10 feet on uneven surfaces activity did not occur: Safety/medical concerns         Wheelchair     Assist Will patient use wheelchair at discharge?: Yes Type of Wheelchair: Manual    Wheelchair assist level: Supervision/Verbal cueing Max wheelchair distance: 50    Wheelchair 50 feet with 2 turns activity    Assist        Assist Level: Supervision/Verbal  cueing   Wheelchair 150 feet activity     Assist     Assist Level: Minimal Assistance - Patient > 75%    Medical Problem List and Plan: 1.Functional deficits and left hemiparesissecondary to right MCA distribution embolic infarcts  Continue CIR  Team conference today to discuss current and goals and coordination of care, home and environmental barriers, and discharge planning with nursing, case manager, and therapies.  2. DVT/anticoagulation:Pharmaceutical:Lovenox -antiplatelet therapy: ASA/Plavix 3. Pain Management:Tylenol prn 4. Mood:LCSW to follow for evaluation and support. -antipsychotic agents: N/A 5. Neuropsych: This patientiscapable of making decisions on herown behalf. 6. Skin/Wound Care:Routine pressure relief measuresRIght Upper lip   laceration sutures out  7. Fluids/Electrolytes/Nutrition:Monitor I/O. 8. T2DM with hyperglycemia: New diagnosis with Hgb A1c-9.3.  Has been educated on carb modified diet.  Changed Lantus/novolog to orals and titrate upwardsas indicated. Will continue to use sliding scale insulin for elevated blood sugars. CBG (last 3)  Recent Labs    06/28/19 2118 06/29/19 0642 06/29/19 1141  GLUCAP 143* 125* 168*   Metformin DC'd  Continue glimepiride  Labile on 8/5, monitor for trend 9. Bilateral carotid artery stenosis:Follow-up with vascular surgery after discharge 10. New onsetsimple partialseizures: Continue Keppra 1000 mg bid.  No reported issues on medication since admission to rehab to date 28.  Obesity: BMI31.0   Educate patient on importance of weight loss to help with overall health and mobility. 12. HTN: Monitor BP tid--avoid hypotension.  Vitals:   06/28/19 2007 06/29/19 0317  BP: (!) 156/66 125/64  Pulse: 80 74  Resp: 20 19  Temp: 98.4 F (36.9 C) 98.3 F (36.8 C)  SpO2: 97% 97%   Zestril 20 mg/day  PRN Hydralazine ordered  Extremely labile with SBP >180 13. Tobacco use:  Continue to educate on importance of cessation.  14. Dyslipidemia:Continue statin 15.  AKI  Creatinine 1.29 on 8/3  Encourage fluids LOS: 20 days A FACE TO FACE EVALUATION WAS PERFORMED   Lorie Phenix 06/29/2019, 1:44 PM

## 2019-06-29 NOTE — Progress Notes (Signed)
Speech Language Pathology Daily Session Note  Patient Details  Name: Tammy Nunez MRN: [id number] Date of Birth: [birth year]  Today's Date: 06/29/2019 SLP Individual Time: 0730-0800, 1345-1415 SLP Individual Time Calculation (min): 30 min, 30 min  Short Term Goals: Week 3: SLP Short Term Goal 1 (Week 3): Patient will demonstrate semi-complex problem solving for familiar tasks with Min A cues. SLP Short Term Goal 2 (Week 3): Patient will self-monitor and correct errors during functional tasks with Min A cues. SLP Short Term Goal 3 (Week 3): Patient will scan to left field of enviornment during functional tasks with Mod A verbal cues.  Skilled Therapeutic Interventions:  Skilled treatment session #1 focused on cognition goals. SLP facilitated session by providing Total A compensatory for visual-spatial deficits in complex visual scanning tasks. Depsite this level of assistance, pt unable to scan within more visually distracting print.   Skilled treatment session #2 focused on cognition goals. Pt's husband and sister had been in for caregiver education during earlier portion of day. Pt able to recall information discussed and demonstrate anticipatory awareness of safety precautions within home environment.       Pain Pain Assessment Pain Scale: Faces Pain Score: 0-No pain  Therapy/Group: Individual Therapy  Caswell Alvillar 06/29/2019, 1:51 PM

## 2019-06-30 ENCOUNTER — Inpatient Hospital Stay (HOSPITAL_COMMUNITY): Payer: PPO | Admitting: Speech Pathology

## 2019-06-30 ENCOUNTER — Inpatient Hospital Stay (HOSPITAL_COMMUNITY): Payer: PPO | Admitting: Occupational Therapy

## 2019-06-30 ENCOUNTER — Inpatient Hospital Stay (HOSPITAL_COMMUNITY): Payer: PPO | Admitting: Physical Therapy

## 2019-06-30 ENCOUNTER — Telehealth: Payer: Self-pay

## 2019-06-30 LAB — GLUCOSE, CAPILLARY
Glucose-Capillary: 155 mg/dL — ABNORMAL HIGH (ref 70–99)
Glucose-Capillary: 127 mg/dL — ABNORMAL HIGH (ref 70–99)
Glucose-Capillary: 115 mg/dL — ABNORMAL HIGH (ref 70–99)
Glucose-Capillary: 184 mg/dL — ABNORMAL HIGH (ref 70–99)

## 2019-06-30 NOTE — Plan of Care (Signed)
  Problem: Consults Goal: RH STROKE PATIENT EDUCATION Description: See Patient Education module for education specifics  06/30/2019 1527 by Adria Devon, LPN Outcome: Progressing 06/30/2019 1526 by Ellison Carwin A, LPN Outcome: Progressing 06/30/2019 1526 by Ellison Carwin A, LPN Reactivated   Problem: RH BLADDER ELIMINATION Goal: RH STG MANAGE BLADDER WITH ASSISTANCE Description: STG Manage Bladder With Mod I Assistance 06/30/2019 1527 by Adria Devon, LPN Outcome: Progressing 06/30/2019 1526 by Ellison Carwin A, LPN Outcome: Progressing 06/30/2019 1526 by Ellison Carwin A, LPN Reactivated   Problem: RH SKIN INTEGRITY Goal: RH STG SKIN FREE OF INFECTION/BREAKDOWN Description: No new breakdown with supervision cues/assist  06/30/2019 1527 by Ellison Carwin A, LPN Outcome: Progressing 06/30/2019 1526 by Adria Devon, LPN Outcome: Progressing 06/30/2019 1526 by Ellison Carwin A, LPN Reactivated Goal: RH STG ABLE TO PERFORM INCISION/WOUND CARE W/ASSISTANCE Description: STG Able To Perform Incision/Wound Care to lip With mod I Assistance. 06/30/2019 1527 by Adria Devon, LPN Outcome: Progressing 06/30/2019 1526 by Adria Devon, LPN Outcome: Progressing 06/30/2019 1526 by Adria Devon, LPN Reactivated   Problem: RH COGNITION-NURSING Goal: RH STG ANTICIPATES NEEDS/CALLS FOR ASSIST W/ASSIST/CUES Description: STG Anticipates Needs/Calls for Assist With supervision Assistance/Cues. 06/30/2019 1527 by Adria Devon, LPN Outcome: Progressing 06/30/2019 1526 by Adria Devon, LPN Outcome: Progressing 06/30/2019 1526 by Ellison Carwin A, LPN Reactivated   Problem: RH KNOWLEDGE DEFICIT Goal: RH STG INCREASE KNOWLEDGE OF DIABETES Description: Pt will be able to verbalize understanding of blood sugar monitoring, dietary and medical management of diabetes, and importance of follow up care with physician at discharge.  06/30/2019 1527 by Adria Devon, LPN Outcome: Progressing 06/30/2019 1526 by Adria Devon, LPN Outcome: Progressing 06/30/2019 1526 by Adria Devon, LPN Reactivated

## 2019-06-30 NOTE — Progress Notes (Signed)
Speech Language Pathology Discharge Summary  Patient Details  Name: Tammy Nunez MRN: [id number] Date of Birth: 02-Jun-1951  Today's Date: 06/30/2019 SLP Individual Time: 0715-0800 SLP Individual Time Calculation (min): 45 min   Skilled Therapeutic Interventions:  Skilled treatment session focused on cognition goals. SLP recieved pt in bed, awake, alert and excited about going home on 07/01/19. With Mod I, pt able to recall PT/OT recommendations for transfer and able to list safe activities to complete within home environment.    Patient has met 6 of 6 long term goals.  Patient to discharge at overall Min;Supervision level.  Reasons goals not met:   N/A  Clinical Impression/Discharge Summary:   Pt has made good progress during skilled ST sessions and as a result she has met 6 out of 6 LTGs. She is currently Min A to supervision for basic to semi-complex problem solving tasks. Would recommend follow up HHST to address problem solving within home environment and anticipatory awareness.   Care Partner:  Caregiver Able to Provide Assistance: Yes  Type of Caregiver Assistance: Physical;Cognitive  Recommendation:  Home Health SLP;24 hour supervision/assistance  Rationale for SLP Follow Up: Maximize cognitive function and independence;Reduce caregiver burden   Equipment:   N/A  Reasons for discharge: Treatment goals met   Patient/Family Agrees with Progress Made and Goals Achieved: Yes    Zavia Pullen 06/30/2019, 8:07 AM

## 2019-06-30 NOTE — Progress Notes (Signed)
PHYSICAL MEDICINE & REHABILITATION PROGRESS NOTE   Subjective/Complaints: Patient seen laying in bed this morning.  She states she slept well overnight.  She is looking forward to discharge tomorrow.  ROS: Denies CP, SOB, N/V  Objective:   No results found. No results for input(s): WBC, HGB, HCT, PLT in the last 72 hours. No results for input(s): NA, K, CL, CO2, GLUCOSE, BUN, CREATININE, CALCIUM in the last 72 hours.  Intake/Output Summary (Last 24 hours) at 06/30/2019 1512 Last data filed at 06/30/2019 1331 Gross per 24 hour  Intake 680 ml  Output -  Net 680 ml     Physical Exam: Vital Signs Blood pressure (!) 142/64, pulse 73, temperature 98.3 F (36.8 C), resp. rate 17, height 5\' 2"  (1.575 m), weight 75.5 kg, SpO2 99 %. Constitutional: No distress . Vital signs reviewed. HENT: Normocephalic.  Atraumatic. Eyes: EOMI. No discharge. Cardiovascular: No JVD. Respiratory: Normal effort.  No stridor. GI: Non-distended. Skin: Warm and dry.  Intact. Psych: Normal mood.  Normal behavior. Musc: No edema.  No tenderness. Neurologic: Alert Motor: LUE: Shoulder abduction 4-/5, elbow flexion/extension 2+/5, handgrip 2-/5 LLE: 4+/5 proximal distal Increase in tone noted  Assessment/Plan: 1. Functional deficits secondary to Left Hemi R MCA which require 3+ hours per day of interdisciplinary therapy in a comprehensive inpatient rehab setting.  Physiatrist is providing close team supervision and 24 hour management of active medical problems listed below.  Physiatrist and rehab team continue to assess barriers to discharge/monitor patient progress toward functional and medical goals  Care Tool:  Bathing    Body parts bathed by patient: Left arm, Chest, Abdomen, Front perineal area, Buttocks, Right upper leg, Right lower leg, Left upper leg, Face   Body parts bathed by helper: Left lower leg, Right arm Body parts n/a: Right arm, Left arm, Chest, Abdomen, Left lower leg,  Right lower leg, Face, Left upper leg, Right upper leg   Bathing assist Assist Level: Minimal Assistance - Patient > 75%     Upper Body Dressing/Undressing Upper body dressing Upper body dressing/undressing activity did not occur (including orthotics): Safety/medical concerns What is the patient wearing?: Pull over shirt    Upper body assist Assist Level: Moderate Assistance - Patient 50 - 74%    Lower Body Dressing/Undressing Lower body dressing      What is the patient wearing?: Pants, Incontinence brief     Lower body assist Assist for lower body dressing: Moderate Assistance - Patient 50 - 74%     Toileting Toileting    Toileting assist Assist for toileting: Moderate Assistance - Patient 50 - 74%(1/2 steps)     Transfers Chair/bed transfer  Transfers assist     Chair/bed transfer assist level: Supervision/Verbal cueing     Locomotion Ambulation   Ambulation assist      Assist level: Minimal Assistance - Patient > 75% Assistive device: Walker-rolling Max distance: 52ft   Walk 10 feet activity   Assist     Assist level: Minimal Assistance - Patient > 75% Assistive device: Walker-rolling   Walk 50 feet activity   Assist Walk 50 feet with 2 turns activity did not occur: Safety/medical concerns  Assist level: Minimal Assistance - Patient > 75% Assistive device: Walker-rolling    Walk 150 feet activity   Assist Walk 150 feet activity did not occur: Safety/medical concerns         Walk 10 feet on uneven surface  activity   Assist Walk 10 feet on uneven surfaces activity  did not occur: Safety/medical concerns         Wheelchair     Assist Will patient use wheelchair at discharge?: Yes Type of Wheelchair: Manual    Wheelchair assist level: Supervision/Verbal cueing, Set up assist Max wheelchair distance: 125ft    Wheelchair 50 feet with 2 turns activity    Assist        Assist Level: Set up assist,  Supervision/Verbal cueing   Wheelchair 150 feet activity     Assist     Assist Level: Set up assist, Supervision/Verbal cueing    Medical Problem List and Plan: 1.Functional deficits and left hemiparesis, now with spasticitysecondary to right MCA distribution embolic infarcts  Continue CIR  WHO/PRAFO ordered  Plan for d/c tomorrow  Will see patient for transitional care management in 1-2 weeks post-discharge  Consider outpatient spasticity treatment if necessary 2. DVT/anticoagulation:Pharmaceutical:Lovenox -antiplatelet therapy: ASA/Plavix 3. Pain Management:Tylenol prn 4. Mood:LCSW to follow for evaluation and support. -antipsychotic agents: N/A 5. Neuropsych: This patientiscapable of making decisions on herown behalf. 6. Skin/Wound Care:Routine pressure relief measuresRIght Upper lip   laceration sutures out  7. Fluids/Electrolytes/Nutrition:Monitor I/O. 8. T2DM with hyperglycemia: New diagnosis with Hgb A1c-9.3.  Has been educated on carb modified diet.  Changed Lantus/novolog to orals and titrate upwardsas indicated. Will continue to use sliding scale insulin for elevated blood sugars. CBG (last 3)  Recent Labs    06/29/19 2143 06/30/19 0649 06/30/19 1120  GLUCAP 178* 155* 127*   Metformin DC'd  Continue glimepiride  Slightly labile on 8/6, monitor for trend 9. Bilateral carotid artery stenosis:Follow-up with vascular surgery after discharge 10. New onsetsimple partialseizures: Continue Keppra 1000 mg bid.  No reported issues on medication since admission to rehab to date 46.  Obesity: BMI31.0   Educate patient on importance of weight loss to help with overall health and mobility. 12. HTN: Monitor BP tid--avoid hypotension.  Vitals:   06/30/19 0548 06/30/19 1317  BP: (!) 146/67 (!) 142/64  Pulse: 79 73  Resp: 14 17  Temp: 98.7 F (37.1 C) 98.3 F (36.8 C)  SpO2: 98% 99%   Zestril 20 mg/day  PRN Hydralazine  ordered  Labile on 8/6, but improving 13. Tobacco use: Continue to educate on importance of cessation.  14. Dyslipidemia:Continue statin 15.  AKI  Creatinine 1.29 on 8/3  Encourage fluids LOS: 21 days A FACE TO FACE EVALUATION WAS PERFORMED  Ankit Lorie Phenix 06/30/2019, 3:12 PM

## 2019-06-30 NOTE — Progress Notes (Signed)
Physical Therapy Discharge Summary  Patient Details  Name: Tammy Nunez MRN: [id number] Date of Birth: 02-04-1951  Today's Date: 06/30/2019 PT Individual Time: 5809-9833 and 8250-5397 PT Individual Time Calculation (min): 31 min and 79 min   Patient has met 8 of 8 long term goals due to improved activity tolerance, improved balance, improved postural control, increased strength, functional use of  left lower extremity, improved attention, improved awareness and improved coordination.  Patient to discharge at a wheelchair level [place].   Patient's care partner attended family education/training and  is independent to provide the necessary physical assistance at discharge.  All goals met.  Recommendation:  Patient will benefit from ongoing skilled PT services in home health setting to continue to advance safe functional mobility, address ongoing impairments in L inattention, L hemibody paresis, L LE motor coordination/control and muscle activation/timing, static and dynamic standing balance, trunk control, gait training, transfer training, bed mobility training, stair navigation training, and minimize fall risk.  Equipment: 18x18 ultra hemi-height w/c, RW, w/c cushion  Reasons for discharge: treatment goals met and discharge from hospital  Patient/family agrees with progress made and goals achieved: Yes  Skilled Therapeutic Interventions/Progress Updates:  Session 1: Pt received sitting in w/c and agreeable to therapy session. Pt educated on this therapist's recommendation to use w/c as primary means of mobility at home for increased safety. Sit<>stands w/c<>RW with close supervision for safety; however, pt continues to require hand-over-hand assistance for L UE placement on RW orthotic and using R hand to strap it on due to L inattention. Stand pivot w/c<>EOB using RW with CGA for steadying and min cuing for sequencing as pt continues to demonstrate L attention and impaired motor  planning of functional tasks. Supine<>sit, HOB flat and not using bedrails, with supervision and increased time. Performed R hemi-technique w/c propulsion ~125f with close supervision and min/mod cuing to avoid obstacles on L side as pt continues to demonstrate significant L inattention - slow propulsion technique requiring increased time with cuing for improved use of R LE to direct w/c. Ambulated 587fusing RW with min assist for balance, therapist not assisting with AD, but pt continues to require min cuing for AD management and ensuring proper L foot placement after swing phase. Ambulated 5640fno AD, with heavier min assist for balance and manual facilitation for R/L lateral weight shifting onto stance limb as well as improved R pelvic rotation during L LE swing phase. Transported back to room in w/c and pt left sitting in w/c with needs in reach and seat belt alarm on.  Session 2: Pt received sitting in w/c and agreeable to therapy session. Pt's w/c for home and soon after her w/c cushion arrived. Stand pivot, no AD, w/c>w/c with CGA/min assist for balance and cuing for sequencing, especially L LE stepping/placement. Pt attempted w/c propulsion in her w/c for home and she was unable to adequately reach floor with R foot and was sliding her hips towards the front edge of w/c seat. SW present and PT discussed pt's need for ultra hemi-height to allow for pt to perform w/c propulsion more independently and safer at home. Transported to/from gym in w/c. Performed stand pivot simulated car transfer (sedan height), no AD, with CGA for steadying and cuing for L LE placement/stepping. Ambulated ~14f21f/down ramp using RW with min assist for balance. Ambulated ~10ft34fver mulch and up/down curb step all using R UE support on handrail with min assist for balance and mod cuing for proper LE leading  stepping up/down on curb step - pt educated not to perform these tasks with her family at home as she has a ramped  entrance for w/c access. Transported in w/c to main therapy gym. Ascended/descended 8 steps using R UE support on bilateral handrails with min assist for balance and max cuing for stepping up leading with R LE and down leading with L LE. Squat pivot transfers w/c<>EOM with supervision. Patient participated in [facility] and demonstrates increased fall risk as noted by score of  21/56.  (<36= high risk for falls, close to 100%; 37-45 significant >80%; 46-51 moderate >50%; 52-55 lower >25%). Transported back to room in w/c and pt left sitting in w/c with needs in reach, L UE support on w/c tray, and seat belt alarm on.  PT Discharge Precautions/Restrictions Precautions Precautions: Fall Restrictions Weight Bearing Restrictions: No Pain (both sessions) Pain Assessment Pain Scale: 0-10 Pain Score: 0-No pain Pain Intervention(s): Other (Comment)(therapy to tolerance) Perception  Perception Perception: Impaired Inattention/Neglect: Does not attend to left side of body;Does not attend to left visual field Praxis Praxis: Impaired Praxis Impairment Details: Ideomotor;Motor planning Praxis-Other Comments: partially impaired due to L inattention  Cognition Overall Cognitive Status: Impaired/Different from baseline Arousal/Alertness: Awake/alert Orientation Level: Oriented X4 Awareness: Impaired Awareness Impairment: Emergent impairment;Anticipatory impairment Problem Solving: Impaired Problem Solving Impairment: Functional complex Safety/Judgment: Appears intact Sensation Sensation Light Touch: Impaired Detail Peripheral sensation comments: pt reports equal sensation in  BLEs but when assessed demonstrates impaired light touch in L LE (could be affected by inattention to that side with impaired integration of the touch sensation) Light Touch Impaired Details: Impaired LLE Hot/Cold: Not tested Proprioception: Impaired Detail Proprioception Impaired Details: Impaired LLE(poor  proprioceptive awareness of L LE location during functional mobility tasks) Stereognosis: Not tested Additional Comments: Pt unable to detect light touch in the arm distal of the elbow. Coordination Gross Motor Movements are Fluid and Coordinated: No Fine Motor Movements are Fluid and Coordinated: No Coordination and Movement Description: L hemiparesis still present in addition to sigificant L inattention and impaired L hemibody proprioceptive awareness Heel Shin Test: WFL on R LE and impaired on L LE due to strength and proprioceptive impairments Motor  Motor Motor: Hemiplegia;Abnormal postural alignment and control;Other (comment) Motor - Discharge Observations: L hemiparesis  Mobility Bed Mobility Bed Mobility: Supine to Sit;Sit to Supine Supine to Sit: Supervision/Verbal cueing Sit to Supine: Supervision/Verbal cueing Transfers Transfers: Sit to Stand;Stand Pivot Transfers;Stand to Sit;Squat Pivot Transfers Sit to Stand: Supervision/Verbal cueing Stand to Sit: Supervision/Verbal cueing Stand Pivot Transfers: Minimal Assistance - Patient > 75%;Contact Guard/Touching assist Squat Pivot Transfers: Contact Guard/Touching assist;Supervision/Verbal cueing Transfer (Assistive device): Rolling walker(or no AD) Locomotion  Gait Ambulation: Yes Gait Assistance: Minimal Assistance - Patient > 75% Gait Distance (Feet): 56 Feet Assistive device: Rolling walker;Other (Comment)(and L RW orthotic) Gait Assistance Details: Verbal cues for sequencing;Verbal cues for technique;Verbal cues for safe use of DME/AE;Verbal cues for gait pattern Gait Gait: Yes Gait Pattern: Impaired Gait Pattern: Decreased step length - right;Decreased stride length;Decreased stance time - left;Decreased weight shift to left;Poor foot clearance - left(poor L LE coordination/proprioceptive awareness) Stairs / Additional Locomotion Stairs: Yes Stairs Assistance: Minimal Assistance - Patient > 75% Stair Management  Technique: Two rails Number of Stairs: 8 Height of Stairs: 6 Ramp: Minimal Assistance - Patient >75% Curb: Minimal Assistance - Patient >75% Wheelchair Mobility Wheelchair Mobility: Yes Wheelchair Assistance: Set up assist(and supervision) Wheelchair Propulsion: Right upper extremity;Right lower extremity Wheelchair Parts Management: Supervision/cueing;Needs assistance Distance: 195f  Trunk/Postural Assessment  Cervical Assessment Cervical Assessment: Exceptions to WFL(head rotated) Thoracic Assessment Thoracic Assessment: (trunk rotated L in standing) Lumbar Assessment Lumbar Assessment: Exceptions to [facility] Postural Control Postural Control: Deficits on evaluation Righting Reactions: delayed and insufficient Protective Responses: impaired Postural Limitations: decreased  Balance Balance Balance Assessed: Yes Standardized Balance Assessment Standardized Balance Assessment: Berg Balance Test Berg Balance Test Sit to Stand: Able to stand without using hands and stabilize independently(uses back of legs on mat to stabilize) Standing Unsupported: Able to stand 2 minutes with supervision Sitting with Back Unsupported but Feet Supported on Floor or Stool: Able to sit safely and securely 2 minutes Stand to Sit: Sits safely with minimal use of hands Transfers: Needs one person to assist(was doing with supervision until anterior LOB transfering to L due to not stepping L LE appropriately) Standing Unsupported with Eyes Closed: Able to stand 10 seconds with supervision Standing Ubsupported with Feet Together: Needs help to attain position but able to stand for 30 seconds with feet together From Standing, Reach Forward with Outstretched Arm: Reaches forward but needs supervision From Standing Position, Pick up Object from Floor: Unable to try/needs assist to keep balance From Standing Position, Turn to Look Behind Over each Shoulder: Needs assist to keep from losing balance and  falling(unable to turn with shoulders, only turns head) Turn 360 Degrees: Needs assistance while turning Standing Unsupported, Alternately Place Feet on Step/Stool: Needs assistance to keep from falling or unable to try Standing Unsupported, One Foot in Front: Needs help to step but can hold 15 seconds Standing on One Leg: Unable to try or needs assist to prevent fall Total Score: 22 Static Sitting Balance Static Sitting - Balance Support: Feet supported Static Sitting - Level of Assistance: 5: Stand by assistance Dynamic Sitting Balance Dynamic Sitting - Balance Support: During functional activity Dynamic Sitting - Level of Assistance: 5: Stand by assistance Static Standing Balance Static Standing - Balance Support: During functional activity Static Standing - Level of Assistance: 4: Min assist Dynamic Standing Balance Dynamic Standing - Balance Support: During functional activity;Bilateral upper extremity supported Dynamic Standing - Level of Assistance: 4: Min assist Extremity Assessment      RLE Assessment RLE Assessment: Within Functional Limits General Strength Comments: Grossly 5/5 throughout LLE Assessment LLE Assessment: Exceptions to [facility] LLE Strength Left Hip Flexion: 4-/5 Left Knee Flexion: 4-/5 Left Knee Extension: 4/5 Left Ankle Dorsiflexion: 3/5 Left Ankle Plantar Flexion: 3+/5    Tawana Scale, PT, DPT 06/30/2019, 8:10 AM

## 2019-06-30 NOTE — Telephone Encounter (Signed)
Copied from [place] [phone]. Topic: General - Other >> Jun 30, 2019  2:26 PM Keene Breath wrote: Reason for CRM: Social worker called to schedule a new patient appt.  Please call Becky to schedule at [phone].

## 2019-06-30 NOTE — Plan of Care (Signed)
  Problem: Consults Goal: RH STROKE PATIENT EDUCATION Description: See Patient Education module for education specifics  06/30/2019 1528 by Adria Devon, LPN Outcome: Progressing 06/30/2019 1527 by Adria Devon, LPN Outcome: Progressing 06/30/2019 1526 by Adria Devon, LPN Outcome: Progressing 06/30/2019 1526 by Ellison Carwin A, LPN Reactivated   Problem: RH BLADDER ELIMINATION Goal: RH STG MANAGE BLADDER WITH ASSISTANCE Description: STG Manage Bladder With Mod I Assistance 06/30/2019 1528 by Adria Devon, LPN Outcome: Progressing 06/30/2019 1527 by Adria Devon, LPN Outcome: Progressing 06/30/2019 1526 by Ellison Carwin A, LPN Outcome: Progressing 06/30/2019 1526 by Ellison Carwin A, LPN Reactivated   Problem: RH SKIN INTEGRITY Goal: RH STG SKIN FREE OF INFECTION/BREAKDOWN Description: No new breakdown with supervision cues/assist  06/30/2019 1528 by Ellison Carwin A, LPN Outcome: Progressing 06/30/2019 1527 by Adria Devon, LPN Outcome: Progressing 06/30/2019 1526 by Adria Devon, LPN Outcome: Progressing 06/30/2019 1526 by Ellison Carwin A, LPN Reactivated Goal: RH STG ABLE TO PERFORM INCISION/WOUND CARE W/ASSISTANCE Description: STG Able To Perform Incision/Wound Care to lip With mod I Assistance. 06/30/2019 1528 by Adria Devon, LPN Outcome: Progressing 06/30/2019 1527 by Adria Devon, LPN Outcome: Progressing 06/30/2019 1526 by Adria Devon, LPN Outcome: Progressing 06/30/2019 1526 by Adria Devon, LPN Reactivated   Problem: RH COGNITION-NURSING Goal: RH STG ANTICIPATES NEEDS/CALLS FOR ASSIST W/ASSIST/CUES Description: STG Anticipates Needs/Calls for Assist With supervision Assistance/Cues. 06/30/2019 1528 by Adria Devon, LPN Outcome: Progressing 06/30/2019 1527 by Adria Devon, LPN Outcome: Progressing 06/30/2019 1526 by Adria Devon, LPN Outcome: Progressing 06/30/2019 1526 by Ellison Carwin A,  LPN Reactivated   Problem: RH KNOWLEDGE DEFICIT Goal: RH STG INCREASE KNOWLEDGE OF DIABETES Description: Pt will be able to verbalize understanding of blood sugar monitoring, dietary and medical management of diabetes, and importance of follow up care with physician at discharge.  06/30/2019 1528 by Adria Devon, LPN Outcome: Progressing 06/30/2019 1527 by Adria Devon, LPN Outcome: Progressing 06/30/2019 1526 by Adria Devon, LPN Outcome: Progressing 06/30/2019 1526 by Adria Devon, LPN Reactivated

## 2019-06-30 NOTE — Progress Notes (Signed)
Social Work Discharge Note   The overall goal for the admission was met for:   Discharge location: Yes-HOME WITH HUSBAND WHO CAN PROVIDE 24 HR CARE  Length of Stay: Yes-22 DAYS  Discharge activity level: Yes-SUPERVISION-MIN ASSIST  Home/community participation: Yes  Services provided included: MD, RD, PT, OT, SLP, RN, CM, Pharmacy and SW  Financial Services: Private Insurance: [employer]  Follow-up services arranged: Home Health: [facility]-PT,OT,SP, DME: [facility]-WHEELCHAIR & 3 IN 1 and Patient/Family has no preference for HH/DME agencies  Comments (or additional information): HUSBAND WAS HERE TO [place] FEELS COMFORTABLE WITH PT'S CARE. PCP SET UP [address] WITH Ander Purpura GUSE 8/17 @ 11:00 AM  Patient/Family verbalized understanding of follow-up arrangements: Yes  Individual responsible for coordination of the follow-up plan: WILLIE-HUSBAND  Confirmed correct DME delivered: Elease Hashimoto 06/30/2019    Elease Hashimoto

## 2019-06-30 NOTE — Progress Notes (Signed)
Occupational Therapy Discharge Summary  Patient Details  Name: Tammy Nunez MRN: [id number] Date of Birth: 09-08-1951  Today's Date: 06/30/2019 OT Individual Time: 1001-1103 OT Individual Time Calculation (min): 62 min   Session Note:  Pt worked on bathing and dressing during session.  Min assist for transfer squat pivot from the bed to the wheelchair as well as from the bed to the walk-in shower.  She needed min assist for all bathing sit to stand with incorporation of the wash mitt on the left hand for washing the right arm.  Min assist for transfer into and out of the shower as well with mod assist and max demonstrational cueing for sequencing dressing following hemi dressing techniques.  Pt finished session with oral hygiene at the sink.  Therapist applied shoe buttons to both lace up shoes and demonstrated use.  Pt will need continued practice with using them as well as donning left shoe with AFO in place.   Patient has met 9 of 12 long term goals due to improved activity tolerance, improved balance, postural control, ability to compensate for deficits, functional use of  LEFT upper and LEFT lower extremity, improved attention, improved awareness and improved coordination.  Patient to discharge at overall Mod Assist level.  Patient's care partner is independent to provide the necessary physical and cognitive assistance at discharge.    Reasons goals not met: Pt needs min assist or greater for emergent or anticipatory awareness.  She also needs mod assist for UB dressing tasks.  LUE functional use is at a gross assist level but needs mod assist.   Recommendation:  Patient will benefit from ongoing skilled OT services in home health setting to continue to advance functional skills in the area of BADL and Reduce care partner burden.  Pt will continue to need extensive follow-up therapy via [facility] at this time to continue progress with LUE functional use, awareness, visual perception and field cut  compensation as well as balance.  She currently needs min to mod assist at this time, which family can provide.    Equipment: tub bench  Reasons for discharge: treatment goals met and discharge from hospital  Patient/family agrees with progress made and goals achieved: Yes  OT Discharge Precautions/Restrictions  Precautions Precautions: Fall Precaution Comments: left hemiparesis Restrictions Weight Bearing Restrictions: No  Pain Pain Assessment Pain Scale: 0-10 Pain Score: 0-No pain Pain Intervention(s): Other (Comment)(therapy to tolerance) ADL ADL Eating: Set up Where Assessed-Eating: Wheelchair Grooming: Setup Where Assessed-Grooming: Wheelchair Upper Body Bathing: Minimal assistance Where Assessed-Upper Body Bathing: Shower Lower Body Bathing: Minimal assistance Where Assessed-Lower Body Bathing: Shower Upper Body Dressing: Moderate assistance Where Assessed-Upper Body Dressing: Wheelchair Lower Body Dressing: Moderate assistance Where Assessed-Lower Body Dressing: Wheelchair Toileting: Minimal assistance Where Assessed-Toileting: Bedside Commode Toilet Transfer: Minimal assistance Toilet Transfer Method: Stand pivot Science writer: Radiographer, therapeutic: Moderate assistance Tub/Shower Transfer Method: Stand pivot Tub/Shower Equipment: Facilities manager: Environmental education officer Method: Public house manager with back Vision Baseline Vision/History: No visual deficits Patient Visual Report: Peripheral vision impairment Vision Assessment?: Yes Eye Alignment: Within Functional Limits Ocular Range of Motion: Within Functional Limits Alignment/Gaze Preference: Within Defined Limits Tracking/Visual Pursuits: Decreased smoothness of vertical tracking;Decreased smoothness of horizontal tracking Convergence: Within functional limits Visual Fields: Left visual field  deficit Perception  Perception: Impaired Inattention/Neglect: Does not attend to left side of body;Does not attend to left visual field Praxis Praxis: Impaired Praxis Impairment Details: Ideomotor;Motor planning  Praxis-Other Comments: partially impaired due to L inattention Cognition Overall Cognitive Status: Impaired/Different from baseline Arousal/Alertness: Awake/alert Orientation Level: Oriented X4 Attention: Focused;Sustained;Selective Focused Attention: Appears intact Sustained Attention: Appears intact Selective Attention: Impaired Selective Attention Impairment: Functional basic Memory: Impaired Memory Impairment: Decreased recall of new information Awareness: Impaired Awareness Impairment: Emergent impairment;Anticipatory impairment Problem Solving: Impaired Problem Solving Impairment: Functional basic Safety/Judgment: Appears intact Comments: Pt with decreased memory carryover of specific dressing techniques performed each session. Sensation Sensation Light Touch: Impaired Detail Light Touch Impaired Details: Impaired LUE Proprioception: Impaired Detail Proprioception Impaired Details: Impaired LUE Additional Comments: Pt unable to detect light touch in the arm distal of the elbow. Coordination Gross Motor Movements are Fluid and Coordinated: No Fine Motor Movements are Fluid and Coordinated: No Coordination and Movement Description: Left hemiparesis still present at Brunnstrum stage IV level in the arm and stage III in the hand.  She continues to need max assist for integration into bathing and grooming tasks. Motor  Motor Motor: Hemiplegia Motor - Discharge Observations: L hemiparesis Mobility  Bed Mobility Bed Mobility: Supine to Sit Supine to Sit: Supervision/Verbal cueing Transfers Sit to Stand: Contact Guard/Touching assist Stand to Sit: Contact Guard/Touching assist  Trunk/Postural Assessment  Cervical Assessment Cervical Assessment: Exceptions to  WFL(slight right head turn) Thoracic Assessment Thoracic Assessment: Exceptions to WFL(Left trunk passive elongation) Lumbar Assessment Lumbar Assessment: Exceptions to WFL(posterior pelvic tilt)  Balance Balance Balance Assessed: Yes Static Sitting Balance Static Sitting - Balance Support: Feet supported Static Sitting - Level of Assistance: 5: Stand by assistance Dynamic Sitting Balance Dynamic Sitting - Balance Support: During functional activity Dynamic Sitting - Level of Assistance: 5: Stand by assistance Static Standing Balance Static Standing - Balance Support: During functional activity Static Standing - Level of Assistance: 4: Min assist Dynamic Standing Balance Dynamic Standing - Balance Support: During functional activity;Bilateral upper extremity supported Dynamic Standing - Level of Assistance: 4: Min assist Extremity/Trunk Assessment RUE Assessment RUE Assessment: Within Functional Limits LUE Assessment LUE Assessment: Exceptions to [facility] General Strength Comments: Brunnstrum stage IV movement in the arm and stage III in the hand.  Increased flexor tone noted in the digits and elbow.  She demonstrates gross digit flexion to 70% with only trace extension Brunstrum levels for arm and hand: Arm;Hand Brunstrum level for arm: Stage IV Movement is deviating from synergy Brunstrum level for hand: Stage III Synergies performed voluntarily LUE Tone LUE Tone: Moderate   , OTR/L 06/30/2019, 4:38 PM

## 2019-07-01 LAB — GLUCOSE, CAPILLARY: Glucose-Capillary: 151 mg/dL — ABNORMAL HIGH (ref 70–99)

## 2019-07-01 MED ORDER — LISINOPRIL 20 MG PO TABS: 20.0000 mg | ORAL_TABLET | Freq: Every day | ORAL | 1 refills | Status: DC

## 2019-07-01 MED ORDER — LEVETIRACETAM 1000 MG PO TABS: 1000.0000 mg | ORAL_TABLET | Freq: Two times a day (BID) | ORAL | 1 refills | Status: DC

## 2019-07-01 MED ORDER — HYDRALAZINE HCL 10 MG PO TABS: 10.0000 mg | ORAL_TABLET | Freq: Three times a day (TID) | ORAL | 1 refills | Status: DC | PRN

## 2019-07-01 MED ORDER — ATORVASTATIN CALCIUM 40 MG PO TABS: 40.0000 mg | ORAL_TABLET | Freq: Every day | ORAL | 1 refills | Status: DC

## 2019-07-01 MED ORDER — GLIMEPIRIDE 2 MG PO TABS: 2.0000 mg | ORAL_TABLET | Freq: Every day | ORAL | 1 refills | Status: DC

## 2019-07-01 MED ORDER — CLOPIDOGREL BISULFATE 75 MG PO TABS: 75.0000 mg | ORAL_TABLET | Freq: Every day | ORAL | 1 refills | Status: DC

## 2019-07-01 MED ORDER — LEVETIRACETAM 1000 MG PO TABS: 1000.0000 mg | ORAL_TABLET | Freq: Two times a day (BID) | ORAL | Status: DC

## 2019-07-01 MED ORDER — VITAMIN B-12 1000 MCG PO TABS: 1000.0000 ug | ORAL_TABLET | Freq: Every day | ORAL | 1 refills | Status: AC

## 2019-07-01 MED ORDER — VITAMIN B-12 1000 MCG PO TABS: 1000.0000 ug | ORAL_TABLET | Freq: Every day | ORAL | 1 refills | Status: DC

## 2019-07-01 MED ORDER — ACETAMINOPHEN 325 MG PO TABS: 325.0000 mg | ORAL_TABLET | ORAL | Status: DC | PRN

## 2019-07-01 NOTE — Plan of Care (Signed)
  Problem: Consults Goal: RH STROKE PATIENT EDUCATION Description: See Patient Education module for education specifics  Outcome: Completed/Met   Problem: RH BLADDER ELIMINATION Goal: RH STG MANAGE BLADDER WITH ASSISTANCE Description: STG Manage Bladder With Mod I Assistance Outcome: Completed/Met   Problem: RH SKIN INTEGRITY Goal: RH STG SKIN FREE OF INFECTION/BREAKDOWN Description: No new breakdown with supervision cues/assist  Outcome: Completed/Met Goal: RH STG ABLE TO PERFORM INCISION/WOUND CARE W/ASSISTANCE Description: STG Able To Perform Incision/Wound Care to lip With mod I Assistance. Outcome: Completed/Met   Problem: RH COGNITION-NURSING Goal: RH STG ANTICIPATES NEEDS/CALLS FOR ASSIST W/ASSIST/CUES Description: STG Anticipates Needs/Calls for Assist With supervision Assistance/Cues. Outcome: Completed/Met   Problem: RH KNOWLEDGE DEFICIT Goal: RH STG INCREASE KNOWLEDGE OF DIABETES Description: Pt will be able to verbalize understanding of blood sugar monitoring, dietary and medical management of diabetes, and importance of follow up care with physician at discharge.  Outcome: Completed/Met   Problem: RH Dressing Goal: LTG Patient will perform upper body dressing (OT) Description: LTG Patient will perform upper body dressing with assist, with/without cues (OT). Outcome: Completed/Met   Problem: RH Functional Use of Upper Extremity Goal: LTG Patient will use RT/LT upper extremity as a (OT) Description: LTG: Patient will use right/left upper extremity as a stabilizer/gross assist/diminished/nondominant/dominant level with assist, with/without cues during functional activity (OT) Outcome: Completed/Met   Problem: RH Awareness Goal: LTG: Patient will demonstrate awareness during functional activites type of (OT) Description: LTG: Patient will demonstrate awareness during functional activites type of (OT) Outcome: Completed/Met   Problem: Food- and Nutrition-Related  Knowledge Deficit (NB-1.1) Goal: Nutrition education Description: Formal process to instruct or train a patient/client in a skill or to impart knowledge to help patients/clients voluntarily manage or modify food choices and eating behavior to maintain or improve health. Outcome: Completed/Met   Problem: RH Leisure Awareness Goal: LTG: Patient will participate in leisure activities (TR) Description: LTG: Patient will participate in leisure activities (simple/moderate/difficult) to increase ability to functionally perform activity, identify and utilize resources, identify new leisure interests, utilize adaptive equipment at specific level  (TR) Outcome: Completed/Met

## 2019-07-01 NOTE — Progress Notes (Signed)
[place] PHYSICAL MEDICINE & REHABILITATION PROGRESS NOTE   Subjective/Complaints: Patient seen laying in bed this morning.  She states she slept well overnight.  She is ready for discharge.  ROS: Denies CP, SOB, N/V  Objective:   No results found. No results for input(s): WBC, HGB, HCT, PLT in the last 72 hours. No results for input(s): NA, K, CL, CO2, GLUCOSE, BUN, CREATININE, CALCIUM in the last 72 hours.  Intake/Output Summary (Last 24 hours) at 07/01/2019 1244 Last data filed at 07/01/2019 0900 Gross per 24 hour  Intake 520 ml  Output -  Net 520 ml     Physical Exam: Vital Signs Blood pressure (!) 118/53, pulse 71, temperature 98 F (36.7 C), temperature source Oral, resp. rate 18, height 5\' 2"  (1.575 m), weight 75.5 kg, SpO2 97 %. Constitutional: No distress . Vital signs reviewed. HENT: Normocephalic.  Atraumatic. Eyes: EOMI. No discharge. Cardiovascular: No JVD. Respiratory: Normal effort.  No stridor. GI: Non-distended. Skin: Warm and dry.  Intact. Psych: Normal mood.  Normal behavior. Musc: No edema.  No tenderness. Neurologic: Alert Motor: LUE: Shoulder abduction 4-/5, elbow flexion/extension 2+/5, handgrip 2-/5, stable LLE: 4+/5 proximal distal, stable Increase in tone noted  Assessment/Plan: 1. Functional deficits secondary to Left Hemi R MCA which require 3+ hours per day of interdisciplinary therapy in a comprehensive inpatient rehab setting.  Physiatrist is providing close team supervision and 24 hour management of active medical problems listed below.  Physiatrist and rehab team continue to assess barriers to discharge/monitor patient progress toward functional and medical goals  Care Tool:  Bathing    Body parts bathed by patient: Left arm, Chest, Abdomen, Front perineal area, Buttocks, Right upper leg, Right lower leg, Left upper leg, Face   Body parts bathed by helper: Right arm, Left lower leg Body parts n/a: Right arm, Left arm, Chest,  Abdomen, Left lower leg, Right lower leg, Face, Left upper leg, Right upper leg   Bathing assist Assist Level: Minimal Assistance - Patient > 75%     Upper Body Dressing/Undressing Upper body dressing Upper body dressing/undressing activity did not occur (including orthotics): Safety/medical concerns What is the patient wearing?: Pull over shirt    Upper body assist Assist Level: Moderate Assistance - Patient 50 - 74%    Lower Body Dressing/Undressing Lower body dressing      What is the patient wearing?: Underwear/pull up, Pants     Lower body assist Assist for lower body dressing: Moderate Assistance - Patient 50 - 74%     Toileting Toileting    Toileting assist Assist for toileting: Minimal Assistance - Patient > 75%     Transfers Chair/bed transfer  Transfers assist     Chair/bed transfer assist level: Supervision/Verbal cueing     Locomotion Ambulation   Ambulation assist      Assist level: Minimal Assistance - Patient > 75% Assistive device: Walker-rolling Max distance: 81ft   Walk 10 feet activity   Assist     Assist level: Minimal Assistance - Patient > 75% Assistive device: Walker-rolling   Walk 50 feet activity   Assist Walk 50 feet with 2 turns activity did not occur: Safety/medical concerns  Assist level: Minimal Assistance - Patient > 75% Assistive device: Walker-rolling    Walk 150 feet activity   Assist Walk 150 feet activity did not occur: Safety/medical concerns         Walk 10 feet on uneven surface  activity   Assist Walk 10 feet on uneven surfaces  activity did not occur: Safety/medical concerns         Wheelchair     Assist Will patient use wheelchair at discharge?: Yes Type of Wheelchair: Manual    Wheelchair assist level: Supervision/Verbal cueing, Set up assist Max wheelchair distance: 145ft    Wheelchair 50 feet with 2 turns activity    Assist        Assist Level: Set up assist,  Supervision/Verbal cueing   Wheelchair 150 feet activity     Assist     Assist Level: Set up assist, Supervision/Verbal cueing    Medical Problem List and Plan: 1.Functional deficits and left hemiparesis, now with spasticitysecondary to right MCA distribution embolic infarcts  Continue CIR  WHO/PRAFO   DC today   Will see patient for transitional care management in 1-2 weeks post-discharge  Consider outpatient spasticity treatment if necessary 2. DVT/anticoagulation:Pharmaceutical:Lovenox -antiplatelet therapy: ASA/Plavix 3. Pain Management:Tylenol prn 4. Mood:LCSW to follow for evaluation and support. -antipsychotic agents: N/A 5. Neuropsych: This patientiscapable of making decisions on herown behalf. 6. Skin/Wound Care:Routine pressure relief measuresRIght Upper lip   laceration sutures out  7. Fluids/Electrolytes/Nutrition:Monitor I/O. 8. T2DM with hyperglycemia: New diagnosis with Hgb A1c-9.3.  Has been educated on carb modified diet.  Changed Lantus/novolog to orals and titrate upwardsas indicated. Will continue to use sliding scale insulin for elevated blood sugars. CBG (last 3)  Recent Labs    06/30/19 1723 06/30/19 2051 07/01/19 0619  GLUCAP 115* 184* 151*   Metformin DC'd  Continue glimepiride  Labile on 8/7, will require ambulatory monitoring 9. Bilateral carotid artery stenosis:Follow-up with vascular surgery after discharge 10. New onsetsimple partialseizures: Continue Keppra 1000 mg bid.  No reported issues on medication since admission to rehab to date 69.  Obesity: BMI31.0   Educate patient on importance of weight loss to help with overall health and mobility. 12. HTN: Monitor BP tid--avoid hypotension.  Vitals:   06/30/19 2000 07/01/19 0516  BP: (!) 149/62 (!) 118/53  Pulse: 85 71  Resp: 17 18  Temp: 98 F (36.7 C) 98 F (36.7 C)  SpO2: 97% 97%   Zestril 20 mg/day  PRN Hydralazine ordered  Labile  on 8/7, will require ambulatory monitoring 13. Tobacco use: Continue to educate on importance of cessation.  14. Dyslipidemia:Continue statin 15.  AKI  Creatinine 1.29 on 8/3, follow-up labs as outpatient  Encourage fluids LOS: 22 days A FACE TO FACE EVALUATION WAS PERFORMED   Lorie Phenix 07/01/2019, 12:44 PM

## 2019-07-01 NOTE — Discharge Summary (Signed)
Physician Discharge Summary  Patient ID: Tammy Nunez MRN: [id number] DOB/AGE: 11/27/1950 68 y.o.  Admit date: 06/09/2019 Discharge date: 07/01/2019  Discharge Diagnoses:  Principal Problem:   Acute ischemic right MCA stroke [facility]) Active Problems:   Essential hypertension   Seizures ([place])   New onset type 2 diabetes mellitus ([place])   AKI (acute kidney injury) ([place])   Type 2 diabetes mellitus with hyperglycemia, without long-term current use of insulin ([facility])   Discharged Condition: stable   Significant Diagnostic Studies: N/A   Labs:  Basic Metabolic Panel: BMP Latest Ref Rng & Units 06/27/2019 06/24/2019 06/21/2019  Glucose 70 - 99 mg/dL 163(H) 147(H) 160(H)  BUN 8 - 23 mg/dL 34(H) 33(H) 25(H)  Creatinine 0.44 - 1.00 mg/dL 1.29(H) 1.21(H) 1.28(H)  Sodium 135 - 145 mmol/L 138 138 138  Potassium 3.5 - 5.1 mmol/L 4.2 4.3 5.4(H)  Chloride 98 - 111 mmol/L 111 109 110  CO2 22 - 32 mmol/L 19(L) 19(L) 20(L)  Calcium 8.9 - 10.3 mg/dL 9.2 9.6 9.8    CBC: CBC Latest Ref Rng & Units 06/27/2019 06/20/2019 06/13/2019  WBC 4.0 - 10.5 K/uL 4.0 5.4 6.3  Hemoglobin 12.0 - 15.0 g/dL 13.2 15.8(H) 17.1(H)  Hematocrit 36.0 - 46.0 % 39.4 48.3(H) 50.3(H)  Platelets 150 - 400 K/uL 174 229 163    CBG: Recent Labs  Lab 06/30/19 0649 06/30/19 1120 06/30/19 1723 06/30/19 2051 07/01/19 0619  GLUCAP 155* 127* 115* 184* 151*    Brief HPI:   Tammy Nunez is a 68 year old female in relatively good health (but no medical care for years) who was admitted to [facility] on [facility]06[facility]19 with left sided weakness and numbness. She reported symptoms since am that day and BP elevated at 214/110 with BS 285.  CTA head/neck showed severe atherosclerotic disease in both carotid bifurcation and ICA bulbs greater than 80% stenosis on right and greater than 70% stenosis on left.  MRI brain showed embolic infarcts in right MCA territory with minimal petechial blood products.  2D echo showed EF greater than 65% with severely  increased left ventricular wall thickness.  Dr. Doy Mince felt that stroke was due to small vessel disease and recommended DAPT.   Dr. Payton Mccallum was consulted for input and recommended right carotid stenting in couple of weeks followed by left carotid stenting 6 weeks thereafter.  She was noted to have left facial and LUE twitching felt to be due to simple partial motor seizures and was started on Keppra 500 mg bid. This was increased to 1000 mg with resolution of symptoms.  Patient with resultant right gaze preference with left neglect, decreased awareness of deficits, high-level cognitive deficits with left-sided weakness and pusher tendency.  CIR was recommended due to functional deficits.    Hospital Course: Tammy Nunez was admitted to rehab 06/09/2019 for inpatient therapies to consist of PT, ST and OT at least three hours five days a week. Past admission physiatrist, therapy team and rehab RN have worked together to provide customized collaborative inpatient rehab. She was maintained on ASA/Plavix during her stay and no signs of bleeding noted. She did have drop in H/H but platelets have been stable.  Check of lytes revealed AKI and she was encouraged to increase fluid intake. Hypokalemia has resolved with brief supplementation. Her po intake has been good and she is continent of bowel and bladder.  She was advised to continue wearing WHO and PRAFO at nights for support and to prevent contractures.   Blood sugars were monitored on  ac/hs basis and metformin was added for BS control. This  was d/c due to AKI as well as side effects of diarrhea.  Amaryl was added for BS management and she will require continued titration of dose after discharge.   She has been seizure free on Keppra bid. Blood pressures were monitored on bid basis and have shown improvement with addition of Zestril daily.  She has made gains during her stay and is currently min assist at wheelchair level. She will continue to receive  follow up [place], [place] and [place] [facility] after discharge.   Rehab course: During patient's stay in rehab weekly team conferences were held to monitor patient's progress, set goals and discuss barriers to discharge. At admission, patient required mod to max assist with ADL tasks and max assist with mobility. She exhibited mild cognitive impairments affecting awareness, problem solving and attention.  She  has had improvement in activity tolerance, balance, postural control as well as ability to compensate for deficits. She has had improvement in functional use LUE  and LLE as well as improvement in awareness.  He is able to complete ADL tasks with min to mod assist. She requires min assist for squat pivot transfers and and to ambulate 56 feet with use of rolling walker.  She requires min assist to supervision for basic to semi-complex problem-solving tasks.  Family education was completed regarding all aspects of care and safety   Disposition:  Home   Diet: Heart Healthy. Carb modified.   Special Instructions: 1.  Follow up with PCP for further adjustment in [place]. 2. Will need follow up  BMET/CBC in 1-2 weeks.   3. Follow up with Dr. Payton Mccallum input on carotid stenting.    Discharge Instructions    Ambulatory referral to Physical Medicine Rehab   Complete by: As directed    1-2 weeks transitional care follow up     Allergies as of 07/01/2019   No Known Allergies     Medication List    STOP taking these medications   ibuprofen 200 MG tablet Commonly known as: ADVIL   insulin glargine 100 UNIT/ML injection Commonly known as: LANTUS     TAKE these medications   acetaminophen 325 MG tablet Commonly known as: TYLENOL Take 1-2 tablets (325-650 mg total) by mouth every 4 (four) hours as needed for mild pain.   aspirin 81 MG EC tablet Take 1 tablet (81 mg total) by mouth daily.   atorvastatin 40 MG tablet Commonly known as: LIPITOR Take 1 tablet (40 mg total) by mouth daily  at 6 PM.   clopidogrel 75 MG tablet Commonly known as: PLAVIX Take 1 tablet (75 mg total) by mouth daily.   glimepiride 2 MG tablet Commonly known as: AMARYL Take 1 tablet (2 mg total) by mouth daily with breakfast.   levETIRAcetam 1000 MG tablet Commonly known as: KEPPRA Take 1 tablet (1,000 mg total) by mouth 2 (two) times daily.   lisinopril 20 MG tablet Commonly known as: ZESTRIL Take 1 tablet (20 mg total) by mouth daily. What changed:   medication strength  how much to take   vitamin B-12 1000 MCG tablet Commonly known as: CYANOCOBALAMIN Take 1 tablet (1,000 mcg total) by mouth daily.      Follow-up Information    [name], FNP Follow up on 07/11/2019.   Specialty: Family Medicine Why: APPOINTMENT @ 11:00 AM Contact information: [place] [address] [phone]  Kirsteins, Luanna Salk, MD Follow up.   Specialty: Physical Medicine and Rehabilitation Why: Office will call you with follow up appointment Contact information: [place] [address] [phone]        [place]. Call on 07/04/2019.   Why: for follow up appointment Contact information: [address]     [place] [address] [phone]          Signed: Bary Leriche 07/05/2019, 3:17 PM

## 2019-07-01 NOTE — Discharge Instructions (Signed)
Inpatient Rehab Discharge Instructions  Tammy Nunez Discharge date and time: 07/01/19    Activities/Precautions/ Functional Status: Activity: no lifting, driving, or strenuous exercise till cleared by MD Diet: cardiac diet and diabetic diet Wound Care: none needed   Functional status:  ___ No restrictions     ___ Walk up steps independently _X__ 24/7 supervision/assistance   ___ Walk up steps with assistance ___ Intermittent supervision/assistance  ___ Bathe/dress independently ___ Walk with walker     ___ Bathe/dress with assistance ___ Walk Independently    ___ Shower independently ___ Walk with assistance    __X_ Shower with assistance _X__ No alcohol     ___ Return to work/school ________   Special Instructions:   Per [employer] statutes, patients with seizures are not allowed to drive until  they have been seizure-free for six months. Use caution when using heavy equipment or power tools. Avoid working on ladders or at heights. Take showers instead of baths. Ensure the water temperature is not too high on the home water heater. Do not go swimming alone. When caring for infants or small children, sit down when holding, feeding, or changing them to minimize risk of injury to the child in the event you have a seizure. Also, Maintain good sleep hygiene. Avoid alcohol.   COMMUNITY REFERRALS UPON DISCHARGE:    Home Health:   PT, OT, SP     Agency:[facility]   Phone:[phone]   Date of last service:07/01/2019  Medical Equipment/Items Ordered:SUPER HEMI WHEELCHAIR & 3 IN 1  Agency/Supplier:[facility]   [phone]  STROKE/TIA DISCHARGE INSTRUCTIONS SMOKING Cigarette smoking nearly doubles your risk of having a stroke & is the single most alterable risk factor  If you smoke or have smoked in the last 12 months, you are advised to quit smoking for your health.  Most of the excess cardiovascular risk related to smoking disappears within a year of stopping.  Ask  you doctor about anti-smoking medications  [place] Quit Line: 1-800-QUIT NOW  Free Smoking Cessation Classes [phone]  CHOLESTEROL Know your levels; limit fat & cholesterol in your diet  Lipid Panel     Component Value Date/Time   CHOL 305 (H) 06/07/2019 0836   TRIG 244 (H) 06/07/2019 0836   HDL 39 (L) 06/07/2019 0836   CHOLHDL 7.8 06/07/2019 0836   VLDL 49 (H) 06/07/2019 0836   LDLCALC 217 (H) 06/07/2019 0836      Many patients benefit from treatment even if their cholesterol is at goal.  Goal: Total Cholesterol (CHOL) less than 160  Goal:  Triglycerides (TRIG) less than 150  Goal:  HDL greater than 40  Goal:  LDL (LDLCALC) less than 100   BLOOD PRESSURE American Stroke Association blood pressure target is less that 120/80 mm/Hg  Your discharge blood pressure is:  BP: (!) 118/53  Monitor your blood pressure  Limit your salt and alcohol intake  Many individuals will require more than one medication for high blood pressure  DIABETES (A1c is a blood sugar average for last 3 months) Goal HGBA1c is under 7% (HBGA1c is blood sugar average for last 3 months)  Diabetes:     Lab Results  Component Value Date   HGBA1C 9.3 (H) 06/07/2019     Your HGBA1c can be lowered with medications, healthy diet, and exercise.  Check your blood sugar as directed by your physician  Call your physician if you experience unexplained or low blood sugars.  PHYSICAL ACTIVITY/REHABILITATION Goal is 30 minutes  at least 4 days per week  Activity: No driving, Therapies: see above Return to work: NA  Activity decreases your risk of heart attack and stroke and makes your heart stronger.  It helps control your weight and blood pressure; helps you relax and can improve your mood.  Participate in a regular exercise program.  Talk with your doctor about the best form of exercise for you (dancing, walking, swimming, cycling).  DIET/WEIGHT Goal is to maintain a healthy weight  Your discharge diet  is:  Diet Order            Diet regular Room service appropriate? Yes; Fluid consistency: Thin  Diet effective 1000            liquids Your height is:  Height: 5\' 2"  (157.5 cm) Your current weight is: Weight: 75.5 kg Your Body Mass Index (BMI) is:  BMI (Calculated): 30.44  Following the type of diet specifically designed for you will help prevent another stroke.  Your goal weight is:   Your goal Body Mass Index (BMI) is 19-24.  Healthy food habits can help reduce 3 risk factors for stroke:  High cholesterol, hypertension, and excess weight.  RESOURCES Stroke/Support Group:  Call [phone]   STROKE EDUCATION PROVIDED/REVIEWED AND GIVEN TO PATIENT Stroke warning signs and symptoms How to activate emergency medical system (call 911). Medications prescribed at discharge. Need for follow-up after discharge. Personal risk factors for stroke. Pneumonia vaccine given:  Flu vaccine given:  My questions have been answered, the writing is legible, and I understand these instructions.  I will adhere to these goals & educational materials that have been provided to me after my discharge from the hospital.     My questions have been answered and I understand these instructions. I will adhere to these goals and the provided educational materials after my discharge from the hospital.  Patient/Caregiver Signature _______________________________ Date __________  Clinician Signature _______________________________________ Date __________  Please bring this form and your medication list with you to all your follow-up doctor's appointments.

## 2019-07-03 DIAGNOSIS — Z7984 Long term (current) use of oral hypoglycemic drugs: Secondary | ICD-10-CM | POA: Diagnosis not present

## 2019-07-03 DIAGNOSIS — G8929 Other chronic pain: Secondary | ICD-10-CM | POA: Diagnosis not present

## 2019-07-03 DIAGNOSIS — E119 Type 2 diabetes mellitus without complications: Secondary | ICD-10-CM | POA: Diagnosis not present

## 2019-07-03 DIAGNOSIS — Z6830 Body mass index (BMI) 30.0-30.9, adult: Secondary | ICD-10-CM | POA: Diagnosis not present

## 2019-07-03 DIAGNOSIS — G4089 Other seizures: Secondary | ICD-10-CM | POA: Diagnosis not present

## 2019-07-03 DIAGNOSIS — M25512 Pain in left shoulder: Secondary | ICD-10-CM | POA: Diagnosis not present

## 2019-07-03 DIAGNOSIS — F17210 Nicotine dependence, cigarettes, uncomplicated: Secondary | ICD-10-CM | POA: Diagnosis not present

## 2019-07-03 DIAGNOSIS — E785 Hyperlipidemia, unspecified: Secondary | ICD-10-CM | POA: Diagnosis not present

## 2019-07-03 DIAGNOSIS — Z9181 History of falling: Secondary | ICD-10-CM | POA: Diagnosis not present

## 2019-07-03 DIAGNOSIS — I69354 Hemiplegia and hemiparesis following cerebral infarction affecting left non-dominant side: Secondary | ICD-10-CM | POA: Diagnosis not present

## 2019-07-03 DIAGNOSIS — I69322 Dysarthria following cerebral infarction: Secondary | ICD-10-CM | POA: Diagnosis not present

## 2019-07-03 DIAGNOSIS — I10 Essential (primary) hypertension: Secondary | ICD-10-CM | POA: Diagnosis not present

## 2019-07-05 ENCOUNTER — Telehealth: Payer: Self-pay

## 2019-07-05 NOTE — Telephone Encounter (Signed)
Verdis Frederickson PT [facility] [phone] called requesting verbal orders physical therapy for 2xwk X 5wks followed by 1xwk X 3wks.  Called her back and approved verbal orders.  In addition she has requested a script for this patient to receive DME of a rolling walker with a platform attachment for the left hand area and for the prescription to be faxed to them at fax number [phone].  Informed her I will forward that request to the provider.

## 2019-07-05 NOTE — Telephone Encounter (Signed)
Remind me tomorrow. thx

## 2019-07-07 ENCOUNTER — Other Ambulatory Visit: Payer: Self-pay

## 2019-07-07 ENCOUNTER — Telehealth (INDEPENDENT_AMBULATORY_CARE_PROVIDER_SITE_OTHER): Payer: Self-pay

## 2019-07-07 NOTE — Telephone Encounter (Signed)
Rhonda from [employer] left a message regarding the patients carotid stent placement on 07/20/2019. Suanne Marker stated the patient did not know of the appt with Dr. Lucky Cowboy but the patients husband told her he knew about the appt. I attempted to contact the patient regarding this appt that was scheduled when the patient was in the hospital.. I was unable to leave a message, I will mail out pre-procedure instructions to the home. The procedure is with Dr. Delana Meyer and Dr. Lucky Cowboy will assist.

## 2019-07-07 NOTE — Telephone Encounter (Signed)
Script written and signed by provider, faxed to [facility]

## 2019-07-08 DIAGNOSIS — I6523 Occlusion and stenosis of bilateral carotid arteries: Secondary | ICD-10-CM | POA: Diagnosis not present

## 2019-07-11 ENCOUNTER — Other Ambulatory Visit: Payer: Self-pay

## 2019-07-11 ENCOUNTER — Encounter: Payer: Self-pay | Admitting: Family Medicine

## 2019-07-11 ENCOUNTER — Ambulatory Visit (INDEPENDENT_AMBULATORY_CARE_PROVIDER_SITE_OTHER): Payer: PPO | Admitting: Family Medicine

## 2019-07-11 VITALS — BP 168/76 | HR 92 | Temp 97.3°F | Resp 18 | Ht 61.5 in | Wt 158.4 lb

## 2019-07-11 DIAGNOSIS — I10 Essential (primary) hypertension: Secondary | ICD-10-CM | POA: Diagnosis not present

## 2019-07-11 DIAGNOSIS — E876 Hypokalemia: Secondary | ICD-10-CM

## 2019-07-11 DIAGNOSIS — Z8673 Personal history of transient ischemic attack (TIA), and cerebral infarction without residual deficits: Secondary | ICD-10-CM

## 2019-07-11 DIAGNOSIS — I639 Cerebral infarction, unspecified: Secondary | ICD-10-CM

## 2019-07-11 DIAGNOSIS — N179 Acute kidney failure, unspecified: Secondary | ICD-10-CM

## 2019-07-11 DIAGNOSIS — E119 Type 2 diabetes mellitus without complications: Secondary | ICD-10-CM | POA: Diagnosis not present

## 2019-07-11 LAB — CBC
HCT: 41.7 % (ref 36.0–46.0)
Hemoglobin: 14.2 g/dL (ref 12.0–15.0)
MCHC: 34.1 g/dL (ref 30.0–36.0)
MCV: 90.3 fl (ref 78.0–100.0)
Platelets: 189 10*3/uL (ref 150.0–400.0)
RBC: 4.62 Mil/uL (ref 3.87–5.11)
RDW: 13.4 % (ref 11.5–15.5)
WBC: 6.3 10*3/uL (ref 4.0–10.5)

## 2019-07-11 LAB — BASIC METABOLIC PANEL
BUN: 20 mg/dL (ref 6–23)
CO2: 21 mEq/L (ref 19–32)
Calcium: 10.1 mg/dL (ref 8.4–10.5)
Chloride: 106 mEq/L (ref 96–112)
Creatinine, Ser: 0.86 mg/dL (ref 0.40–1.20)
GFR: 79.3 mL/min (ref >60.00)
Glucose, Bld: 130 mg/dL — ABNORMAL HIGH (ref 70–99)
Potassium: 3.1 mEq/L — ABNORMAL LOW (ref 3.5–5.1)
Sodium: 141 mEq/L (ref 135–145)

## 2019-07-11 MED ORDER — BLOOD GLUCOSE METER KIT: PACK | 0 refills | Status: DC

## 2019-07-11 MED ORDER — CARVEDILOL 6.25 MG PO TABS: 6.2500 mg | ORAL_TABLET | Freq: Two times a day (BID) | ORAL | 3 refills | Status: DC

## 2019-07-11 NOTE — Progress Notes (Signed)
Subjective:    Patient ID: Tammy Nunez, female    DOB: [birth year], 68 y.o.   MRN: [id number]  HPI   Patient presents to clinic to establish with PCP.  She was recently discharged from the hospital due to a stroke.  Hospital discharge summary reviewed and is copied pasted into this note, discharge summary is highlighted in green color.  Per patient and her husband she had never been diagnosed with high blood pressure or diabetes prior to going to the hospital for her stroke symptoms.  She has been started on medication to control her diabetes, but states she was not given a prescription for a glucose meter.  So far has been tolerating the Amaryl without any problems.  Blood sugars from hospital stay do look like they were responding to the Amaryl.  Patient denies any symptoms of hypoglycemia with this medication.  She has been tolerating blood pressure medication lisinopril without any side effects.  Denies chest pain, shortness of breath, lower extremity swelling.  She will be following up with neurology.  Husband and wife both asking if they could potentially see a neurologist in the [place] area rather than have a drive to [place].  While in the hospital, she was diagnosed with acute kidney injury.  We will follow-up with new basic metabolic panel to keep close eye.   Patient ID: Tammy Nunez MRN: [id number] DOB/AGE: Sep 29, 1951 68 y.o.  Admit date: 06/09/2019 Discharge date: 07/01/2019  Discharge Diagnoses:  Principal Problem:   Acute ischemic right MCA stroke [facility]) Active Problems:   Essential hypertension   Seizures ([place])   New onset type 2 diabetes mellitus ([place])   AKI (acute kidney injury) ([place])   Type 2 diabetes mellitus with hyperglycemia, without long-term current use of insulin ([place])   Discharged Condition: stable    CBG: Last Labs          Recent Labs  Lab 06/30/19 0649 06/30/19 1120 06/30/19 1723 06/30/19 2051 07/01/19 0619  GLUCAP 155* 127*  115* 184* 151*      Brief HPI:   Tammy Nunez is a 68 year old female in relatively good health (but no medical care for years) who was admitted to [facility] on 06/06/19 with left sided weakness and numbness. She reported symptoms since am that day and BP elevated at 214/110 with BS 285.  CTA head/neck showed severe atherosclerotic disease in both carotid bifurcation and ICA bulbs greater than 80% stenosis on right and greater than 70% stenosis on left.  MRI brain showed embolic infarcts in right MCA territory with minimal petechial blood products.  2D echo showed EF greater than 65% with severely increased left ventricular wall thickness.  Dr. Doy Mince felt that stroke was due to small vessel disease and recommended DAPT.   Dr. Payton Mccallum was consulted for input and recommended right carotid stenting in couple of weeks followed by left carotid stenting 6 weeks thereafter.  She was noted to have left facial and LUE twitching felt to be due to simple partial motor seizures and was started on Keppra 500 mg bid. This was increased to 1000 mg with resolution of symptoms.  Patient with resultant right gaze preference with left neglect, decreased awareness of deficits, high-level cognitive deficits with left-sided weakness and pusher tendency.  CIR was recommended due to functional deficits.    Hospital Course: Tammy Nunez was admitted to rehab 06/09/2019 for inpatient therapies to consist of PT, ST and OT at least three hours five days a week. Past admission physiatrist,  therapy team and rehab RN have worked together to provide customized collaborative inpatient rehab. She was maintained on ASA/Plavix during her stay and no signs of bleeding noted. She did have drop in H/H but platelets have been stable.  Check of lytes revealed AKI and she was encouraged to increase fluid intake. Hypokalemia has resolved with brief supplementation. Her po intake has been good and she is continent of bowel and bladder.   She was advised to continue wearing WHO and PRAFO at nights for support and to prevent contractures.   Blood sugars were monitored on ac/hs basis and metformin was added for BS control. This  was d/c due to AKI as well as side effects of diarrhea.  Amaryl was added for BS management and she will require continued titration of dose after discharge.   She has been seizure free on Keppra bid. Blood pressures were monitored on bid basis and have shown improvement with addition of Zestril daily.  She has made gains during her stay and is currently min assist at wheelchair level. She will continue to receive follow up [place], [place] and [place] by [facility] after discharge.   Rehab course: During patient's stay in rehab weekly team conferences were held to monitor patient's progress, set goals and discuss barriers to discharge. At admission, patient required mod to max assist with ADL tasks and max assist with mobility. She exhibited mild cognitive impairments affecting awareness, problem solving and attention.  She  has had improvement in activity tolerance, balance, postural control as well as ability to compensate for deficits. She has had improvement in functional use LUE  and LLE as well as improvement in awareness.  He is able to complete ADL tasks with min to mod assist. She requires min assist for squat pivot transfers and and to ambulate 56 feet with use of rolling walker.  She requires min assist to supervision for basic to semi-complex problem-solving tasks.  Family education was completed regarding all aspects of care and safety   Disposition:  Home   Diet: Heart Healthy. Carb modified.   Special Instructions: 1.  Follow up with PCP for further adjustment in Amaryl. 2. Will need follow up  BMET/CBC in 1-2 weeks.   3. Follow up with Dr. Payton Mccallum input on carotid stenting.   CBC Latest Ref Rng & Units 06/27/2019 06/20/2019 06/13/2019  WBC 4.0 - 10.5 K/uL 4.0 5.4 6.3  Hemoglobin 12.0 - 15.0 g/dL  13.2 15.8(H) 17.1(H)  Hematocrit 36.0 - 46.0 % 39.4 48.3(H) 50.3(H)  Platelets 150 - 400 K/uL 174 229 163   Lab Results  Component Value Date   HGBA1C 9.3 (H) 06/07/2019   BMP Latest Ref Rng & Units 06/27/2019 06/24/2019 06/21/2019  Glucose 70 - 99 mg/dL 163(H) 147(H) 160(H)  BUN 8 - 23 mg/dL 34(H) 33(H) 25(H)  Creatinine 0.44 - 1.00 mg/dL 1.29(H) 1.21(H) 1.28(H)  Sodium 135 - 145 mmol/L 138 138 138  Potassium 3.5 - 5.1 mmol/L 4.2 4.3 5.4(H)  Chloride 98 - 111 mmol/L 111 109 110  CO2 22 - 32 mmol/L 19(L) 19(L) 20(L)  Calcium 8.9 - 10.3 mg/dL 9.2 9.6 9.8   Social History   Tobacco Use  . Smoking status: Current Every Day Smoker    Packs/day: 0.50    Years: 15.00    Pack years: 7.50    Types: Cigarettes  . Smokeless tobacco: Never Used  Substance Use Topics  . Alcohol use: Never    Frequency: Never   Past Surgical History:  Procedure Laterality Date  . ECTOPIC PREGNANCY SURGERY      Review of Systems  Constitutional: Negative for chills, fatigue and fever.  HENT: Negative for congestion, ear pain, sinus pain and sore throat.   Eyes: Negative.   Respiratory: Negative for cough, shortness of breath and wheezing.   Cardiovascular: Negative for chest pain, palpitations and leg swelling.  Gastrointestinal: Negative for abdominal pain, diarrhea, nausea and vomiting.  Genitourinary: Negative for dysuria, frequency and urgency.  Musculoskeletal: Negative for arthralgias and myalgias.  Skin: Negative for color change, pallor and rash.  Neurological: Negative for syncope, light-headedness and headaches.  Psychiatric/Behavioral: The patient is not nervous/anxious.       Objective:   Physical Exam Vitals signs and nursing note reviewed.  Constitutional:      General: She is not in acute distress.    Appearance: She is not ill-appearing, toxic-appearing or diaphoretic.  HENT:     Head: Normocephalic and atraumatic.     Right Ear: Tympanic membrane, ear canal and external ear  normal.     Left Ear: Tympanic membrane, ear canal and external ear normal.  Eyes:     General: No scleral icterus.    Extraocular Movements: Extraocular movements intact.     Conjunctiva/sclera: Conjunctivae normal.     Pupils: Pupils are equal, round, and reactive to light.  Cardiovascular:     Rate and Rhythm: Normal rate and regular rhythm.     Heart sounds: Normal heart sounds.  Pulmonary:     Effort: Pulmonary effort is normal. No respiratory distress.     Breath sounds: Normal breath sounds.  Neurological:     Mental Status: She is alert and oriented to person, place, and time.     Comments: In wheel chair, +left sided upper and lower extremity weakness related to her CVA  Psychiatric:        Mood and Affect: Mood normal.        Behavior: Behavior normal.        Thought Content: Thought content normal.        Judgment: Judgment normal.    Today's Vitals   07/11/19 1105  BP: (!) 168/76  Pulse: 92  Resp: 18  Temp: (!) 97.3 F (36.3 C)  TempSrc: Temporal  SpO2: 98%  Weight: 158 lb 6.4 oz (71.8 kg)  Height: 5' 1.5" (1.562 m)   Body mass index is 29.44 kg/m.     Assessment & Plan:    CVA-we will put in new referral to neurology so patient can have a neurologist in the [place] area.  She will keep already planned neurology appointment in [facility] for early September at this time unless we can get a [place] location sooner.  She will continue Keppra as prescribed to her in the hospital.   Hypertension-blood pressure still running on the higher end.  She will continue lisinopril 20 mg daily and we will also add Coreg twice daily for better control.  Acute kidney injury-we will follow-up with repeat basic metabolic panel.  Type 2 diabetes-she will continue Amaryl as prescribed in the hospital.  We will monitor how her blood sugars respond this medication.  Patient has been both advised that this medication is an older drug and overall works well, but there are  newer and other options on the market if the Amaryl is not as effective as we would like it to be.  Patient & husband would both like to stay on this medicine as it was inexpensive and so  far it has been going well.  Prescription for glucose meter printed off.   Patient will follow-up here in approximately 2 months for recheck on chronic medical conditions.  She is aware someone will call her with lab results when they are available.

## 2019-07-12 NOTE — Addendum Note (Signed)
Addended by: Philis Nettle on: 07/12/2019 04:29 PM   Modules accepted: Orders

## 2019-07-13 DIAGNOSIS — I639 Cerebral infarction, unspecified: Secondary | ICD-10-CM | POA: Diagnosis not present

## 2019-07-15 ENCOUNTER — Other Ambulatory Visit: Payer: Self-pay

## 2019-07-15 ENCOUNTER — Other Ambulatory Visit
Admit: 2019-07-15 | Discharge: 2019-07-15 | Disposition: A | Discharge status: Home / Self Care | Payer: PPO | Source: Ambulatory Visit | Attending: Vascular Surgery | Admitting: Vascular Surgery

## 2019-07-15 DIAGNOSIS — Z01812 Encounter for preprocedural laboratory examination: Secondary | ICD-10-CM | POA: Diagnosis not present

## 2019-07-15 DIAGNOSIS — Z20828 Contact with and (suspected) exposure to other viral communicable diseases: Secondary | ICD-10-CM | POA: Diagnosis not present

## 2019-07-15 LAB — SARS CORONAVIRUS 2 (TAT 6-24 HRS): SARS Coronavirus 2: NEGATIVE

## 2019-07-19 ENCOUNTER — Encounter: Payer: Self-pay | Admitting: Lab

## 2019-07-20 ENCOUNTER — Other Ambulatory Visit (INDEPENDENT_AMBULATORY_CARE_PROVIDER_SITE_OTHER): Payer: Self-pay | Admitting: Nurse Practitioner

## 2019-07-20 ENCOUNTER — Other Ambulatory Visit: Payer: Self-pay

## 2019-07-20 ENCOUNTER — Encounter
Admission: RE | Disposition: A | Discharge status: Home / Self Care | Payer: Self-pay | Source: Home / Self Care | Attending: Vascular Surgery

## 2019-07-20 ENCOUNTER — Encounter: Payer: Self-pay | Admitting: *Deleted

## 2019-07-20 ENCOUNTER — Inpatient Hospital Stay
Admission: RE | Admit: 2019-07-20 | Discharge: 2019-07-21 | DRG: 035 | Disposition: A | Discharge status: Home / Self Care | Payer: PPO | Attending: Vascular Surgery | Admitting: Vascular Surgery

## 2019-07-20 DIAGNOSIS — E785 Hyperlipidemia, unspecified: Secondary | ICD-10-CM | POA: Diagnosis present

## 2019-07-20 DIAGNOSIS — F17210 Nicotine dependence, cigarettes, uncomplicated: Secondary | ICD-10-CM | POA: Diagnosis not present

## 2019-07-20 DIAGNOSIS — I10 Essential (primary) hypertension: Secondary | ICD-10-CM | POA: Diagnosis present

## 2019-07-20 DIAGNOSIS — I6523 Occlusion and stenosis of bilateral carotid arteries: Secondary | ICD-10-CM | POA: Diagnosis not present

## 2019-07-20 DIAGNOSIS — Z833 Family history of diabetes mellitus: Secondary | ICD-10-CM

## 2019-07-20 DIAGNOSIS — I6522 Occlusion and stenosis of left carotid artery: Secondary | ICD-10-CM

## 2019-07-20 DIAGNOSIS — Z8673 Personal history of transient ischemic attack (TIA), and cerebral infarction without residual deficits: Secondary | ICD-10-CM

## 2019-07-20 DIAGNOSIS — E119 Type 2 diabetes mellitus without complications: Secondary | ICD-10-CM | POA: Diagnosis not present

## 2019-07-20 DIAGNOSIS — Z8249 Family history of ischemic heart disease and other diseases of the circulatory system: Secondary | ICD-10-CM | POA: Diagnosis not present

## 2019-07-20 DIAGNOSIS — G8929 Other chronic pain: Secondary | ICD-10-CM | POA: Diagnosis not present

## 2019-07-20 DIAGNOSIS — G8194 Hemiplegia, unspecified affecting left nondominant side: Secondary | ICD-10-CM | POA: Diagnosis present

## 2019-07-20 DIAGNOSIS — I63239 Cerebral infarction due to unspecified occlusion or stenosis of unspecified carotid arteries: Secondary | ICD-10-CM | POA: Diagnosis present

## 2019-07-20 DIAGNOSIS — Z720 Tobacco use: Secondary | ICD-10-CM | POA: Diagnosis not present

## 2019-07-20 HISTORY — DX: Paralytic syndrome, unspecified: G83.9

## 2019-07-20 HISTORY — PX: CAROTID PTA/STENT INTERVENTION: CATH118231

## 2019-07-20 HISTORY — DX: Cerebral infarction, unspecified: I63.9

## 2019-07-20 HISTORY — DX: Cerebral infarction due to unspecified occlusion or stenosis of unspecified carotid artery: I63.239

## 2019-07-20 LAB — GLUCOSE, CAPILLARY
Glucose-Capillary: 130 mg/dL — ABNORMAL HIGH (ref 70–99)
Glucose-Capillary: 123 mg/dL — ABNORMAL HIGH (ref 70–99)
Glucose-Capillary: 161 mg/dL — ABNORMAL HIGH (ref 70–99)
Glucose-Capillary: 179 mg/dL — ABNORMAL HIGH (ref 70–99)
Glucose-Capillary: 187 mg/dL — ABNORMAL HIGH (ref 70–99)

## 2019-07-20 LAB — MRSA PCR SCREENING: MRSA by PCR: NEGATIVE

## 2019-07-20 LAB — POCT ACTIVATED CLOTTING TIME: Activated Clotting Time: 334 seconds

## 2019-07-20 SURGERY — CAROTID PTA/STENT INTERVENTION
Anesthesia: Moderate Sedation | Laterality: Left

## 2019-07-20 MED ORDER — CEFAZOLIN SODIUM-DEXTROSE 2-4 GM/100ML-% IV SOLN
2.0000 g | Freq: Once | INTRAVENOUS | Status: AC
  Administered 2019-07-20: 2 g via INTRAVENOUS

## 2019-07-20 MED ORDER — ASPIRIN EC 81 MG PO TBEC
81.0000 mg | DELAYED_RELEASE_TABLET | Freq: Every day | ORAL | Status: DC
  Administered 2019-07-21: 81 mg via ORAL
  Filled 2019-07-20: qty 1

## 2019-07-20 MED ORDER — HEPARIN SODIUM (PORCINE) 1000 UNIT/ML IJ SOLN
INTRAMUSCULAR | Status: AC
  Filled 2019-07-20: qty 1

## 2019-07-20 MED ORDER — FAMOTIDINE 20 MG PO TABS: 40.0000 mg | ORAL_TABLET | Freq: Once | ORAL | Status: DC | PRN

## 2019-07-20 MED ORDER — ASPIRIN EC 81 MG PO TBEC: 81.0000 mg | DELAYED_RELEASE_TABLET | Freq: Every day | ORAL | Status: DC

## 2019-07-20 MED ORDER — ALUM & MAG HYDROXIDE-SIMETH 200-200-20 MG/5ML PO SUSP: 15.0000 mL | ORAL | Status: DC | PRN

## 2019-07-20 MED ORDER — FENTANYL CITRATE (PF) 100 MCG/2ML IJ SOLN
INTRAMUSCULAR | Status: AC
  Filled 2019-07-20: qty 4

## 2019-07-20 MED ORDER — HEPARIN SODIUM (PORCINE) 1000 UNIT/ML IJ SOLN
INTRAMUSCULAR | Status: DC | PRN
  Administered 2019-07-20: 7000 [IU] via INTRAVENOUS

## 2019-07-20 MED ORDER — ATROPINE SULFATE 1 MG/10ML IJ SOSY
PREFILLED_SYRINGE | INTRAMUSCULAR | Status: AC
  Filled 2019-07-20: qty 20

## 2019-07-20 MED ORDER — DOPAMINE-DEXTROSE 3.2-5 MG/ML-% IV SOLN
INTRAVENOUS | Status: AC
  Administered 2019-07-20: 6 ug/kg/min via INTRAVENOUS
  Filled 2019-07-20: qty 250

## 2019-07-20 MED ORDER — ONDANSETRON HCL 4 MG/2ML IJ SOLN: 4.0000 mg | Freq: Four times a day (QID) | INTRAMUSCULAR | Status: DC | PRN

## 2019-07-20 MED ORDER — SODIUM CHLORIDE 0.9 % IV SOLN: 500.0000 mL | Freq: Once | INTRAVENOUS | Status: DC | PRN

## 2019-07-20 MED ORDER — CHLORHEXIDINE GLUCONATE CLOTH 2 % EX PADS: 6.0000 | MEDICATED_PAD | Freq: Every day | CUTANEOUS | Status: DC

## 2019-07-20 MED ORDER — PHENYLEPHRINE HCL (PRESSORS) 10 MG/ML IV SOLN
INTRAVENOUS | Status: AC
  Filled 2019-07-20: qty 1

## 2019-07-20 MED ORDER — ACETAMINOPHEN 325 MG PO TABS: 325.0000 mg | ORAL_TABLET | ORAL | Status: DC | PRN

## 2019-07-20 MED ORDER — ONDANSETRON HCL 4 MG/2ML IJ SOLN
4.0000 mg | Freq: Four times a day (QID) | INTRAMUSCULAR | Status: DC | PRN
  Administered 2019-07-20: 4 mg via INTRAVENOUS

## 2019-07-20 MED ORDER — ACETAMINOPHEN 325 MG RE SUPP
325.0000 mg | RECTAL | Status: DC | PRN
  Filled 2019-07-20: qty 2

## 2019-07-20 MED ORDER — MIDAZOLAM HCL 2 MG/2ML IJ SOLN
INTRAMUSCULAR | Status: DC | PRN
  Administered 2019-07-20: 1 mg via INTRAVENOUS
  Administered 2019-07-20: 0.5 mg via INTRAVENOUS
  Administered 2019-07-20: 1 mg via INTRAVENOUS
  Administered 2019-07-20: 0.5 mg via INTRAVENOUS

## 2019-07-20 MED ORDER — HYDRALAZINE HCL 20 MG/ML IJ SOLN: 5.0000 mg | INTRAMUSCULAR | Status: DC | PRN

## 2019-07-20 MED ORDER — MIDAZOLAM HCL 2 MG/ML PO SYRP: 8.0000 mg | ORAL_SOLUTION | Freq: Once | ORAL | Status: DC | PRN

## 2019-07-20 MED ORDER — LEVETIRACETAM 500 MG PO TABS
1000.0000 mg | ORAL_TABLET | Freq: Two times a day (BID) | ORAL | Status: DC
  Administered 2019-07-20 – 2019-07-21 (×3): 1000 mg via ORAL
  Filled 2019-07-20 (×4): qty 2

## 2019-07-20 MED ORDER — ATORVASTATIN CALCIUM 20 MG PO TABS
40.0000 mg | ORAL_TABLET | Freq: Every day | ORAL | Status: DC
  Administered 2019-07-20: 40 mg via ORAL
  Filled 2019-07-20: qty 2

## 2019-07-20 MED ORDER — PANTOPRAZOLE SODIUM 40 MG IV SOLR
40.0000 mg | Freq: Every day | INTRAVENOUS | Status: DC
  Administered 2019-07-20: 40 mg via INTRAVENOUS
  Filled 2019-07-20: qty 40

## 2019-07-20 MED ORDER — OXYCODONE HCL 5 MG PO TABS: 5.0000 mg | ORAL_TABLET | ORAL | Status: DC | PRN

## 2019-07-20 MED ORDER — GUAIFENESIN-DM 100-10 MG/5ML PO SYRP: 15.0000 mL | ORAL_SOLUTION | ORAL | Status: DC | PRN

## 2019-07-20 MED ORDER — ESMOLOL HCL-SODIUM CHLORIDE 2000 MG/100ML IV SOLN
INTRAVENOUS | Status: AC
  Filled 2019-07-20: qty 100

## 2019-07-20 MED ORDER — KCL IN DEXTROSE-NACL 20-5-0.9 MEQ/L-%-% IV SOLN
INTRAVENOUS | Status: DC
  Administered 2019-07-20 – 2019-07-21 (×2): via INTRAVENOUS
  Filled 2019-07-20 (×5): qty 1000

## 2019-07-20 MED ORDER — DOPAMINE-DEXTROSE 3.2-5 MG/ML-% IV SOLN
0.0000 ug/kg/min | INTRAVENOUS | Status: DC
  Administered 2019-07-20: 10:00:00 6 ug/kg/min via INTRAVENOUS

## 2019-07-20 MED ORDER — METOPROLOL TARTRATE 5 MG/5ML IV SOLN: 2.0000 mg | INTRAVENOUS | Status: DC | PRN

## 2019-07-20 MED ORDER — DIPHENHYDRAMINE HCL 50 MG/ML IJ SOLN: 50.0000 mg | Freq: Once | INTRAMUSCULAR | Status: DC | PRN

## 2019-07-20 MED ORDER — ATROPINE SULFATE 1 MG/10ML IJ SOSY
PREFILLED_SYRINGE | INTRAMUSCULAR | Status: DC | PRN
  Administered 2019-07-20 (×2): .5 mg via INTRAVENOUS

## 2019-07-20 MED ORDER — VITAMIN B-12 1000 MCG PO TABS
1000.0000 ug | ORAL_TABLET | Freq: Every day | ORAL | Status: DC
  Administered 2019-07-20 – 2019-07-21 (×2): 1000 ug via ORAL
  Filled 2019-07-20 (×2): qty 1

## 2019-07-20 MED ORDER — HYDROMORPHONE HCL 1 MG/ML IJ SOLN: 1.0000 mg | Freq: Once | INTRAMUSCULAR | Status: DC | PRN

## 2019-07-20 MED ORDER — ONDANSETRON HCL 4 MG/2ML IJ SOLN
INTRAMUSCULAR | Status: AC
  Filled 2019-07-20: qty 2

## 2019-07-20 MED ORDER — IODIXANOL 320 MG/ML IV SOLN
INTRAVENOUS | Status: DC | PRN
  Administered 2019-07-20: 60 mL via INTRA_ARTERIAL

## 2019-07-20 MED ORDER — DOCUSATE SODIUM 100 MG PO CAPS: 100.0000 mg | ORAL_CAPSULE | Freq: Every day | ORAL | Status: DC

## 2019-07-20 MED ORDER — CLOPIDOGREL BISULFATE 75 MG PO TABS
75.0000 mg | ORAL_TABLET | Freq: Every day | ORAL | Status: DC
  Administered 2019-07-20 – 2019-07-21 (×2): 75 mg via ORAL
  Filled 2019-07-20 (×2): qty 1

## 2019-07-20 MED ORDER — INSULIN ASPART 100 UNIT/ML ~~LOC~~ SOLN
0.0000 [IU] | Freq: Three times a day (TID) | SUBCUTANEOUS | Status: DC
  Administered 2019-07-20 – 2019-07-21 (×2): 3 [IU] via SUBCUTANEOUS
  Filled 2019-07-20 (×2): qty 1

## 2019-07-20 MED ORDER — METHYLPREDNISOLONE SODIUM SUCC 125 MG IJ SOLR: 125.0000 mg | Freq: Once | INTRAMUSCULAR | Status: DC | PRN

## 2019-07-20 MED ORDER — SODIUM CHLORIDE 0.9 % IV SOLN
INTRAVENOUS | Status: DC
  Administered 2019-07-20: 09:00:00 via INTRAVENOUS

## 2019-07-20 MED ORDER — LABETALOL HCL 5 MG/ML IV SOLN: 10.0000 mg | INTRAVENOUS | Status: DC | PRN

## 2019-07-20 MED ORDER — ASPIRIN 325 MG PO TABS
325.0000 mg | ORAL_TABLET | ORAL | Status: AC
  Administered 2019-07-20: 325 mg via ORAL
  Filled 2019-07-20: qty 1

## 2019-07-20 MED ORDER — PHENOL 1.4 % MT LIQD: 1.0000 | OROMUCOSAL | Status: DC | PRN

## 2019-07-20 MED ORDER — PHENYLEPHRINE 40 MCG/ML (10ML) SYRINGE FOR IV PUSH (FOR BLOOD PRESSURE SUPPORT)
PREFILLED_SYRINGE | INTRAVENOUS | Status: DC | PRN
  Administered 2019-07-20: 40 ug via INTRAVENOUS

## 2019-07-20 MED ORDER — MIDAZOLAM HCL 5 MG/5ML IJ SOLN
INTRAMUSCULAR | Status: AC
  Filled 2019-07-20: qty 10

## 2019-07-20 MED ORDER — FENTANYL CITRATE (PF) 100 MCG/2ML IJ SOLN
INTRAMUSCULAR | Status: DC | PRN
  Administered 2019-07-20 (×2): 50 ug via INTRAVENOUS
  Administered 2019-07-20 (×2): 25 ug via INTRAVENOUS

## 2019-07-20 SURGICAL SUPPLY — 27 items
BALLN TREK RX 3.0X20 (BALLOONS) ×3
BALLN VTRAC 4.5X30X135 (BALLOONS) ×3
BALLOON TREK RX 3.0X20 (BALLOONS) ×1 IMPLANT
BALLOON VTRAC 4.5X30X135 (BALLOONS) ×1 IMPLANT
CATH ANGIO 5F 100CM .035 PIG (CATHETERS) ×3 IMPLANT
CATH BEACON 5 .035 100 H1 TIP (CATHETERS) ×3 IMPLANT
DEVICE EMBOSHIELD NAV6 2.5-4.8 (FILTER) IMPLANT
DEVICE EMBOSHIELD NAV6 4.0-7.0 (FILTER) ×3 IMPLANT
DEVICE PRESTO INFLATION (MISCELLANEOUS) ×6 IMPLANT
DEVICE SAFEGUARD 24CM (GAUZE/BANDAGES/DRESSINGS) ×3 IMPLANT
DEVICE STARCLOSE SE CLOSURE (Vascular Products) ×3 IMPLANT
DEVICE TORQUE .014-.018 (MISCELLANEOUS) ×1 IMPLANT
DEVICE TORQUE .025-.038 (MISCELLANEOUS) ×3 IMPLANT
GAUZE SPONGE 4X4 12PLY STRL (GAUZE/BANDAGES/DRESSINGS) ×3 IMPLANT
GLIDEWIRE ANGLED SS 035X260CM (WIRE) ×3 IMPLANT
GUIDEWIRE PFTE-COATED .018X300 (WIRE) ×3 IMPLANT
NEEDLE ENTRY 21GA 7CM ECHOTIP (NEEDLE) ×3 IMPLANT
PACK ANGIOGRAPHY (CUSTOM PROCEDURE TRAY) ×3 IMPLANT
SET INTRO CAPELLA COAXIAL (SET/KITS/TRAYS/PACK) ×3 IMPLANT
SHEATH BRITE TIP 5FRX11 (SHEATH) ×3 IMPLANT
SHEATH BRITE TIP 6FRX11 (SHEATH) ×3 IMPLANT
SHEATH SHUTTLE SELECT 6F (SHEATH) ×3 IMPLANT
STENT XACT CAR 8-6X30X136 (Permanent Stent) ×3 IMPLANT
TORQUE DEVICE .014-.018 (MISCELLANEOUS) ×3
TUBING CONTRAST HIGH PRESS 72 (TUBING) ×3 IMPLANT
WIRE G VAS 035X260 STIFF (WIRE) ×3 IMPLANT
WIRE J 3MM .035X145CM (WIRE) ×3 IMPLANT

## 2019-07-20 NOTE — H&P (Deleted)
[facility] History & Physical Update  The patient was interviewed and re-examined.  The patient's previous History and Physical has been reviewed and is unchanged.  There is no change in the plan of care. We plan to proceed with the scheduled procedure.  Hortencia Pilar, MD  07/20/2019, 7:49 AM

## 2019-07-20 NOTE — H&P (Signed)
@LOGO @   MRN : [id number]  Tammy Nunez is a 68 y.o. (1950/12/31) female who presents with chief complaint of No chief complaint on file. Marland Kitchen  History of Present Illness:   Patient is a 68 year old woman who presented to [facility] in mid July with the acute onset of left-sided weakness involving both her upper and lower extremities.  Work-up at that time demonstrated bilateral critical carotid stenoses.  She has significant comorbidities including a high bifurcation and profound degenerative changes of the neck as well as poorly controlled hypertension and diabetes.  She was discharged to rehab where she has done well.  No further symptoms.  There is no interval amaurosis fugax TIA CVA.  Patient denies chest pain at this time there is no fever chills or cough.  No outpatient medications have been marked as taking for the 07/20/19 encounter [facility] Encounter).    Past Medical History:  Diagnosis Date  . Chronic left shoulder pain   . Diabetes mellitus without complication ([place])   . Hypertension   . Paralysis ([place])    weakness left upper amd lower extremity  . Stroke [facility])     Past Surgical History:  Procedure Laterality Date  . ECTOPIC PREGNANCY SURGERY      Social History Social History   Tobacco Use  . Smoking status: Current Every Day Smoker    Packs/day: 0.50    Years: 15.00    Pack years: 7.50    Types: Cigarettes  . Smokeless tobacco: Never Used  Substance Use Topics  . Alcohol use: Never    Frequency: Never  . Drug use: Never    Family History Family History  Problem Relation Age of Onset  . Diabetes type II Mother   . Hypertension Father   . Diabetes type II Brother     No Known Allergies   REVIEW OF SYSTEMS (Negative unless checked)  Constitutional: [] Weight loss  [] Fever  [] Chills Cardiac: [] Chest pain   [] Chest pressure   [] Palpitations   [] Shortness of breath when laying flat   [] Shortness of breath with  exertion. Vascular:  [] Pain in legs with walking   [] Pain in legs at rest  [] History of DVT   [] Phlebitis   [] Swelling in legs   [] Varicose veins   [] Non-healing ulcers Pulmonary:   [] Uses home oxygen   [] Productive cough   [] Hemoptysis   [] Wheeze  [] COPD   [] Asthma Neurologic:  [] Dizziness   [] Seizures   [x] History of stroke   [] History of TIA  [] Aphasia   [] Vissual changes   [x] Weakness or numbness in arm   [x] Weakness or numbness in leg Musculoskeletal:   [] Joint swelling   [] Joint pain   [] Low back pain Hematologic:  [] Easy bruising  [] Easy bleeding   [] Hypercoagulable state   [] Anemic Gastrointestinal:  [] Diarrhea   [] Vomiting  [] Gastroesophageal reflux/heartburn   [] Difficulty swallowing. Genitourinary:  [] Chronic kidney disease   [] Difficult urination  [] Frequent urination   [] Blood in urine Skin:  [] Rashes   [] Ulcers  Psychological:  [] History of anxiety   []  History of major depression.  Physical Examination  Vitals:   07/20/19 0724  BP: (!) 198/92  Pulse: 75  Resp: 16  SpO2: 99%  Weight: 75.5 kg  Height: 5\' 3"  (1.6 m)   Body mass index is 29.48 kg/m. Gen: WD/WN, NAD Head: [place]/AT, No temporalis wasting.  Ear/Nose/Throat: Hearing grossly intact, nares w/o erythema or drainage Eyes: PER, EOMI, sclera nonicteric.  Neck: Supple, no large masses.   Pulmonary:  Good air movement, no audible wheezing bilaterally, no use of accessory muscles.  Cardiac: RRR, no JVD Vascular: Bilateral carotid bruits Vessel Right Left  Radial Palpable Palpable  Brachial Palpable Palpable  Carotid Palpable Palpable  Gastrointestinal: Non-distended. No guarding/no peritoneal signs.  Musculoskeletal: M/S 5/5 throughout right side 3/5 left side.  No deformity or atrophy.  Neurologic: CN 2-12 intact. Symmetrical.  Speech is fluent. Motor exam as listed above. Psychiatric: Judgment intact, Mood & affect appropriate for pt's clinical situation. Dermatologic: No rashes or ulcers noted.  No changes  consistent with cellulitis. Lymph : No lichenification or skin changes of chronic lymphedema.  CBC Lab Results  Component Value Date   WBC 6.3 07/11/2019   HGB 14.2 07/11/2019   HCT 41.7 07/11/2019   MCV 90.3 07/11/2019   PLT 189.0 07/11/2019    BMET    Component Value Date/Time   NA 141 07/11/2019 1124   K 3.1 (L) 07/11/2019 1124   CL 106 07/11/2019 1124   CO2 21 07/11/2019 1124   GLUCOSE 130 (H) 07/11/2019 1124   BUN 20 07/11/2019 1124   CREATININE 0.86 07/11/2019 1124   CALCIUM 10.1 07/11/2019 1124   GFRNONAA 43 (L) 06/27/2019 0656   GFRAA 49 (L) 06/27/2019 0656   Estimated Creatinine Clearance: 60.9 mL/min (by C-G formula based on SCr of 0.86 mg/dL).  COAG Lab Results  Component Value Date   INR 1.0 06/06/2019    Radiology No results found.   Assessment/Plan 1. Carotid Stenosis: Patient with severe carotid stenosis bilaterally.  Right internal carotid artery greater than left internal carotid artery.  Patient will most likely need repair to both sides however in the setting of an acute stroke would recommend waiting several weeks.  Agree with initiation of aspirin.  Would also initiate Plavix.  Review of the CTA shows anatomic factors which support stenting; there is minimal calcification the bifurcation is very high rendering it difficult if not impossible for surgery she also has profound degenerative changes of the neck again making surgery very difficult she is obviously symptomatic.  Given these factors carotid stenting of the right followed by stenting of the left will be performed. 2. Tobacco Abuse: We had a discussion for approximately three minutes regarding the absolute need for smoking cessation due to the deleterious nature of tobacco on the vascular system. We discussed the tobacco use would diminish patency of any intervention, and likely significantly worsen progressio of disease. We discussed multiple agents for quitting including replacement therapy or  medications to reduce cravings such as Chantix. The patient voices their understanding of the importance of smoking cessation. 3. Hypertension: On appropriate medications. Encouraged good control as its slows the progression of atherosclerotic disease. 4. Hyperlipidemia: Agree with initiation of aspirin and statin for medical management. Encouraged good control as its slows the progression of atherosclerotic disease. 5.  Degenerative changes of the cervical spine: This has severely impaired her mobility.  Although she does not need surgery at this time it does factor into stenting over surgery.    Hortencia Pilar, MD  07/20/2019 7:57 AM

## 2019-07-20 NOTE — Op Note (Signed)
OPERATIVE NOTE DATE: 07/20/2019  PROCEDURE: 1.  Ultrasound guidance for vascular access right femoral artery 2.  Placement of a 8 x 6 x 30 exact stent with the use of the NAV-6 embolic protection device in the right internal carotid artery  PRE-OPERATIVE DIAGNOSIS: 1.  Right internal carotid artery stenosis greater than 90%. 2.  Right hemispheric CVA with left-sided weakness residual  POST-OPERATIVE DIAGNOSIS:  Same as above  SURGEON: Hortencia Pilar  ASSISTANT(S): Dr. Corene Cornea dew  ANESTHESIA: local/MCS  ESTIMATED BLOOD LOSS: 100 sure cc  CONTRAST: 60 cc  FLUORO TIME: 11.9 minutes  MODERATE CONSCIOUS SEDATION TIME: Continuous ECG pulse oximetry and cardiopulmonary monitoring was performed throughout the entire procedure by the interventional radiology nurse total sedation time was 50 minutes  FINDING(S): 1.   Greater than 90% right internal carotid artery stenosis  SPECIMEN(S):   none  INDICATIONS:   Patient is a 68 y.o. female who presents with critical right internal carotid artery stenosis.  The patient has a very high bifurcation that is inaccessible to surgery she also has had a right hemispheric CVA approximately 6 weeks ago and carotid artery stenting was felt to be preferred to endarterectomy for that reason.  Risks and benefits were discussed and informed consent was obtained.   DESCRIPTION: After obtaining full informed written consent, the patient was brought back to the vascular suite and placed supine upon the table.  The patient received IV antibiotics prior to induction. Moderate conscious sedation was administered during a face to face encounter with the patient throughout the procedure with my supervision of the RN administering medicines and monitoring the patients vital signs and mental status throughout from the start of the procedure until the patient was taken to the recovery room.    After obtaining adequate sedation, the patient was prepped and draped  in the standard fashion.    A first assistant is required in order to allow for a safe and more efficient operation.  Duties include wire manipulations as well as assistance with pinning the sheath and positioning the detector for proper angle, assistance and deploying the stent in the proper position and appropriate images.  Further duties include assisting with patient positioning during the procedure.  I believe that this procedure requires a first assistant in order for it to be performed at a level in keeping with the high standards of this institution.  The right femoral artery was visualized with ultrasound and found to be widely patent. It was then accessed under direct ultrasound guidance without difficulty with a micropuncture needle. A permanent image was recorded.  A microwire was then advanced without difficulty under fluoroscopic guidance followed by a micro-sheath.  A J-wire was placed and we then placed a 6 French sheath. The patient was then heparinized and a total of 7000 units of intravenous heparin were given and an ACT was checked to confirm successful anticoagulation.   A pigtail catheter was then placed into the ascending aorta. This showed a type I arch with moderate tortuosity of the common carotid. The right common carotid artery was then selectively cannulated without difficulty with a H1 catheter and the catheter advanced into the mid right common carotid artery.  Cervical and cerebral carotid angiography was then performed. There were no obvious intracranial filling defects. The carotid bifurcation demonstrated greater than 95% stenosis at the origin of the right internal carotid artery.  The right external carotid artery is very short with multiple branches very early on.  I then advanced  into the external carotid artery with a Glidewire and the H1 catheter and then exchanged for the Amplatz Super Stiff wire. Over the Amplatz Super Stiff wire, a 6 [place] shuttle sheath was placed  into the mid common carotid artery. I then used the NAV-6  Embolic protection device but was not able to cross the lesion initially.  Buddy wire was attempted but this also did not yield an adequate purchase.  Next the nap 6 was removed and over the 0.014 wire a 3 mm x 20 mm rapid exchange balloon was used to cross the lesion and angioplasty was performed without incident.  Using the balloon as a catheter the nab 6 wire was advanced more distally and subsequently the Naff 6 embolic protection filter was positioned well above the lesion without further difficulty.  Crossed the lesion and parked this in the distal internal carotid artery at the base of the skull.  I then selected a 8 x 6 x 30 exact stent. This was deployed across the lesion encompassing it in its entirety. A 4.5 x 20 length balloon was used to post dilate the stent. Only about a 5 % residual stenosis was present after angioplasty. Completion angiogram showed normal intracranial filling without new defects. At this point I elected to terminate the procedure. The sheath was removed and StarClose closure device was deployed in the right femoral artery with excellent hemostatic result. The patient was taken to the recovery room in stable condition having tolerated the procedure well.  COMPLICATIONS: none  CONDITION: stable  Hortencia Pilar 07/20/2019 12:27 PM   This note was created with Dragon Medical transcription system. Any errors in dictation are purely unintentional.

## 2019-07-20 NOTE — OR Nursing (Signed)
Left hand grip weak and unchanged, no droop of mouth noted. Cooperative and oriented

## 2019-07-20 NOTE — Op Note (Signed)
Night of surgery note  S: Patient is awake and alert answering questions appropriately.  She denies pain.  She has no complaints.  O: Vital signs are stable blood pressure is acceptable.  Right groin is intact no evidence of significant hematoma.  Neuro is at baseline she is weak on the right but this is unchanged from her pre-stent status secondary to her stroke 8 weeks ago.  A: Symptomatic greater than 95% stenosis of the right internal carotid artery status post successful stenting.  P: No changes in the current plan.  Patient is doing well.  I am hopeful she will be able to be discharged from the hospital tomorrow.

## 2019-07-21 DIAGNOSIS — I6523 Occlusion and stenosis of bilateral carotid arteries: Principal | ICD-10-CM

## 2019-07-21 LAB — BASIC METABOLIC PANEL
Anion gap: 6 (ref 5–15)
BUN: 18 mg/dL (ref 8–23)
CO2: 23 mmol/L (ref 22–32)
Calcium: 8.4 mg/dL — ABNORMAL LOW (ref 8.9–10.3)
Chloride: 116 mmol/L — ABNORMAL HIGH (ref 98–111)
Creatinine, Ser: 0.78 mg/dL (ref 0.44–1.00)
GFR calc Af Amer: >60 mL/min (ref >60)
GFR calc non Af Amer: >60 mL/min (ref >60)
Glucose, Bld: 210 mg/dL — ABNORMAL HIGH (ref 70–99)
Potassium: 3.3 mmol/L — ABNORMAL LOW (ref 3.5–5.1)
Sodium: 145 mmol/L (ref 135–145)

## 2019-07-21 LAB — CBC
HCT: 29.7 % — ABNORMAL LOW (ref 36.0–46.0)
Hemoglobin: 10 g/dL — ABNORMAL LOW (ref 12.0–15.0)
MCH: 30.8 pg (ref 26.0–34.0)
MCHC: 33.7 g/dL (ref 30.0–36.0)
MCV: 91.4 fL (ref 80.0–100.0)
Platelets: 178 10*3/uL (ref 150–400)
RBC: 3.25 MIL/uL — ABNORMAL LOW (ref 3.87–5.11)
RDW: 13.2 % (ref 11.5–15.5)
WBC: 5.2 10*3/uL (ref 4.0–10.5)
nRBC: 0 % (ref 0.0–0.2)

## 2019-07-21 LAB — GLUCOSE, CAPILLARY: Glucose-Capillary: 183 mg/dL — ABNORMAL HIGH (ref 70–99)

## 2019-07-21 MED ORDER — FERROUS SULFATE 325 (65 FE) MG PO TABS: 325.0000 mg | ORAL_TABLET | Freq: Every day | ORAL | Status: DC

## 2019-07-21 MED ORDER — FERROUS SULFATE 325 (65 FE) MG PO TABS: 325.0000 mg | ORAL_TABLET | Freq: Every day | ORAL | 0 refills | Status: DC

## 2019-07-21 MED ORDER — POTASSIUM CHLORIDE CRYS ER 20 MEQ PO TBCR
40.0000 meq | EXTENDED_RELEASE_TABLET | Freq: Once | ORAL | Status: AC
  Administered 2019-07-21: 40 meq via ORAL
  Filled 2019-07-21: qty 2

## 2019-07-21 MED ORDER — CHLORHEXIDINE GLUCONATE CLOTH 2 % EX PADS
6.0000 | MEDICATED_PAD | Freq: Every day | CUTANEOUS | Status: DC
  Administered 2019-07-21: 6 via TOPICAL

## 2019-07-21 NOTE — Progress Notes (Signed)
Discharged home with husband after discharge teaching done and questions answered

## 2019-07-21 NOTE — Discharge Summary (Signed)
[facility]    Discharge Summary  Patient ID:  Tammy Nunez MRN: [id number] DOB/AGE: Jul 19, 1951 68 y.o.  Admit date: 07/20/2019 Discharge date: 07/21/2019 Date of Surgery: 07/20/2019 Surgeon: Surgeon(s): Schnier, Dolores Lory, MD Algernon Huxley, MD  Admission Diagnosis: Carotid stenosis, symptomatic, with infarction [facility] [I63.239]  Discharge Diagnoses:  Carotid stenosis, symptomatic, with infarction [facility]) [I63.239]  Secondary Diagnoses: Past Medical History:  Diagnosis Date  . Chronic left shoulder pain   . Diabetes mellitus without complication ([place])   . Hypertension   . Paralysis ([place])    weakness left upper amd lower extremity  . Stroke [facility])    Procedure(s): CAROTID PTA/STENT INTERVENTION  Discharged Condition: good  HPI / Hospital Course:  Tammy Nunez is a 68 y.o. female who presents with critical right internal carotid artery stenosis.  The patient has a very high bifurcation that is inaccessible to surgery she also has had a right hemispheric CVA approximately 6 weeks ago and carotid artery stenting was felt to be preferred to endarterectomy for that reason. On 07/20/19, the patient underwent:  1.  Ultrasound guidance for vascular access right femoral artery 2.  Placement of a 8 x 6 x 30 exact stent with the use of the NAV-6 embolic protection device in the right internal carotid artery  The patient tolerated the procedure well was transferred from the recovery room to the ICU for observation overnight.  The patient went procedure was unremarkable.  During the patient's brief inpatient stay her diet was advanced without complication, she was urinating independently, her pain was controlled with the use of p.o. pain medication and she was ambulating at baseline.  On postop day 1/day of discharge, the patient was afebrile with stable vital signs and unremarkable physical exam.  Physical exam:  Alert and oriented x3 Face:  Symmetrical Cardiovascular: Regular rate and rhythm Pulmonary: Clear to auscultation bilaterally Abdomen: Soft, non-tender, non-distended, positive bowel sounds Right groin: Received a dressing in place, clean and dry.  No signs of drainage or swelling Vascular: Lateral lower extremity are warm distally does Neuro: At baseline with left-sided upper and lower extremity weakness.  Right side 5 out of 5.  Labs as below  Complications: None  Consults: None  Significant Diagnostic Studies: CBC Lab Results  Component Value Date   WBC 5.2 07/21/2019   HGB 10.0 (L) 07/21/2019   HCT 29.7 (L) 07/21/2019   MCV 91.4 07/21/2019   PLT 178 07/21/2019   BMET    Component Value Date/Time   NA 145 07/21/2019 0501   K 3.3 (L) 07/21/2019 0501   CL 116 (H) 07/21/2019 0501   CO2 23 07/21/2019 0501   GLUCOSE 210 (H) 07/21/2019 0501   BUN 18 07/21/2019 0501   CREATININE 0.78 07/21/2019 0501   CALCIUM 8.4 (L) 07/21/2019 0501   GFRNONAA >60 07/21/2019 0501   GFRAA >60 07/21/2019 0501   COAG Lab Results  Component Value Date   INR 1.0 06/06/2019   Disposition:  Discharge to :Home  Allergies as of 07/21/2019   No Known Allergies     Medication List    TAKE these medications   acetaminophen 325 MG tablet Commonly known as: TYLENOL Take 1-2 tablets (325-650 mg total) by mouth every 4 (four) hours as needed for mild pain.   aspirin 81 MG EC tablet Take 1 tablet (81 mg total) by mouth daily.   atorvastatin 40 MG tablet Commonly known as: LIPITOR Take 1 tablet (40 mg total) by mouth daily at  6 PM.   blood glucose meter kit and supplies Dispense based on patient and insurance preference. Use up to four times daily as directed. (FOR ICD-10 E10.9, E11.9).   carvedilol 6.25 MG tablet Commonly known as: COREG Take 1 tablet (6.25 mg total) by mouth 2 (two) times daily with a meal.   clopidogrel 75 MG tablet Commonly known as: PLAVIX Take 1 tablet (75 mg total) by mouth daily.    ferrous sulfate 325 (65 FE) MG tablet Take 1 tablet (325 mg total) by mouth daily with breakfast. Start taking on: July 22, 2019   glimepiride 2 MG tablet Commonly known as: AMARYL Take 1 tablet (2 mg total) by mouth daily with breakfast.   levETIRAcetam 1000 MG tablet Commonly known as: KEPPRA Take 1 tablet (1,000 mg total) by mouth 2 (two) times daily.   lisinopril 20 MG tablet Commonly known as: ZESTRIL Take 1 tablet (20 mg total) by mouth daily.   vitamin B-12 1000 MCG tablet Commonly known as: CYANOCOBALAMIN Take 1 tablet (1,000 mcg total) by mouth daily.      Verbal and written Discharge instructions given to the patient. Wound care per Discharge AVS Follow-up Information    Schnier, Dolores Lory, MD In 3 weeks.   Specialties: Vascular Surgery, Cardiology, Radiology, Vascular Surgery Why: Can see Schnier or Arna Medici. Will need carotid duplex with visit. Appointment scheduled for 3pm on Sept 17 with Dr. Delana Meyer by Patty Sermons information: [place] [id number] [phone]          Signed: Sela Hua, PA-C  07/21/2019, 11:23 AM

## 2019-07-21 NOTE — Discharge Instructions (Signed)
Vascular Surgery Discharge Instructions 1) you may remove your groin dressing and shower as tomorrow.  Please keep your groins clean and dry. 2) no driving until you follow-up in our office for your first follow-up visit

## 2019-07-22 ENCOUNTER — Telehealth: Payer: Self-pay | Admitting: Family Medicine

## 2019-07-22 NOTE — Telephone Encounter (Signed)
Transition Care Management Follow-up Telephone Call  How have you been since you were released from the hospital? Patient stated" I feel I am doing wee, trying to bathe myself , but my husband has to help."   Do you understand why you were in the hospital? yes   Do you understand the discharge instrcutions? yes  Items Reviewed:  Medications reviewed: yes  Allergies reviewed: yes  Dietary changes reviewed: yes  Referrals reviewed: yes   Functional Questionnaire:   Activities of Daily Living (ADLs):   She states they are independent in the following: feeding, continence, grooming and toileting States they require assistance with the following: ambulation, bathing and hygiene and dressing   Any transportation issues/concerns?: no   Any patient concerns? no   Confirmed importance and date/time of follow-up visits scheduled: yes   Confirmed with patient if condition begins to worsen call PCP or go to the ER.  Patient was given the Call-a-Nurse line [phone]: yes

## 2019-07-25 ENCOUNTER — Ambulatory Visit (INDEPENDENT_AMBULATORY_CARE_PROVIDER_SITE_OTHER): Payer: PPO | Admitting: Family Medicine

## 2019-07-25 ENCOUNTER — Other Ambulatory Visit: Payer: Self-pay

## 2019-07-25 ENCOUNTER — Encounter: Payer: Self-pay | Admitting: Family Medicine

## 2019-07-25 ENCOUNTER — Encounter: Payer: PPO | Attending: Registered Nurse | Admitting: Registered Nurse

## 2019-07-25 VITALS — BP 152/78 | HR 69 | Temp 97.5°F | Resp 18 | Ht 63.0 in | Wt 160.8 lb

## 2019-07-25 DIAGNOSIS — E876 Hypokalemia: Secondary | ICD-10-CM | POA: Diagnosis not present

## 2019-07-25 DIAGNOSIS — Z8673 Personal history of transient ischemic attack (TIA), and cerebral infarction without residual deficits: Secondary | ICD-10-CM | POA: Diagnosis not present

## 2019-07-25 DIAGNOSIS — I10 Essential (primary) hypertension: Secondary | ICD-10-CM | POA: Diagnosis not present

## 2019-07-25 DIAGNOSIS — I63239 Cerebral infarction due to unspecified occlusion or stenosis of unspecified carotid arteries: Secondary | ICD-10-CM

## 2019-07-25 DIAGNOSIS — I639 Cerebral infarction, unspecified: Secondary | ICD-10-CM

## 2019-07-25 LAB — BASIC METABOLIC PANEL
BUN: 14 mg/dL (ref 6–23)
CO2: 21 mEq/L (ref 19–32)
Calcium: 9.1 mg/dL (ref 8.4–10.5)
Chloride: 108 mEq/L (ref 96–112)
Creatinine, Ser: 0.71 mg/dL (ref 0.40–1.20)
GFR: 98.91 mL/min (ref >60.00)
Glucose, Bld: 100 mg/dL — ABNORMAL HIGH (ref 70–99)
Potassium: 2.8 mEq/L — CL (ref 3.5–5.1)
Sodium: 142 mEq/L (ref 135–145)

## 2019-07-25 LAB — CBC
HCT: 31.6 % — ABNORMAL LOW (ref 36.0–46.0)
Hemoglobin: 11 g/dL — ABNORMAL LOW (ref 12.0–15.0)
MCHC: 35 g/dL (ref 30.0–36.0)
MCV: 89.5 fl (ref 78.0–100.0)
Platelets: 196 10*3/uL (ref 150.0–400.0)
RBC: 3.53 Mil/uL — ABNORMAL LOW (ref 3.87–5.11)
RDW: 13.5 % (ref 11.5–15.5)
WBC: 5.9 10*3/uL (ref 4.0–10.5)

## 2019-07-25 MED ORDER — POTASSIUM CHLORIDE CRYS ER 20 MEQ PO TBCR: 20.0000 meq | EXTENDED_RELEASE_TABLET | Freq: Three times a day (TID) | ORAL | 0 refills | Status: DC

## 2019-07-25 NOTE — Addendum Note (Signed)
Addended by: Philis Nettle on: 07/25/2019 03:19 PM   Modules accepted: Orders

## 2019-07-25 NOTE — Progress Notes (Signed)
Subjective:    Patient ID: Tammy Nunez, female    DOB: 1951-01-21, 68 y.o.   MRN: [id number]  HPI   Patient presents to clinic for post hospital follow-up.  She was seen in the hospital most recently for carotid artery stenosis procedure.  Everything went well.  Admission and discharge summary reviewed.  Patient is feeling well.  She is continuing to do her physical therapy, has physical therapy appointment today.  She is eating and drinking well.  Going to the bathroom normally.  Admit date: 07/20/2019 Discharge date: 07/21/2019 Date of Surgery: 07/20/2019 Surgeon: Surgeon(s): Schnier, Dolores Lory, MD Algernon Huxley, MD  Admission Diagnosis: Carotid stenosis, symptomatic, with infarction [facility]) [I63.239]  Discharge Diagnoses:  Carotid stenosis, symptomatic, with infarction [facility]) [I63.239]  RN TCM phone call done on 07/22/2019 by [facility] RN reviewed in epic  CBC Latest Ref Rng & Units 07/21/2019 07/11/2019 06/27/2019  WBC 4.0 - 10.5 K/uL 5.2 6.3 4.0  Hemoglobin 12.0 - 15.0 g/dL 10.0(L) 14.2 13.2  Hematocrit 36.0 - 46.0 % 29.7(L) 41.7 39.4  Platelets 150 - 400 K/uL 178 189.0 174   BMP Latest Ref Rng & Units 07/21/2019 07/11/2019 06/27/2019  Glucose 70 - 99 mg/dL 210(H) 130(H) 163(H)  BUN 8 - 23 mg/dL 18 20 34(H)  Creatinine 0.44 - 1.00 mg/dL 0.78 0.86 1.29(H)  Sodium 135 - 145 mmol/L 145 141 138  Potassium 3.5 - 5.1 mmol/L 3.3(L) 3.1(L) 4.2  Chloride 98 - 111 mmol/L 116(H) 106 111  CO2 22 - 32 mmol/L 23 21 19(L)  Calcium 8.9 - 10.3 mg/dL 8.4(L) 10.1 9.2    Patient Active Problem List   Diagnosis Date Noted  . Carotid stenosis, symptomatic, with infarction ([place]) 07/20/2019  . Type 2 diabetes mellitus with hyperglycemia, without long-term current use of insulin ([place])   . AKI (acute kidney injury) ([place])   . Essential hypertension   . Seizures ([place])   . New onset type 2 diabetes mellitus ([place])   . Acute ischemic right MCA stroke ([place]) 06/09/2019  . Acute CVA (cerebrovascular  accident) ([place]) 06/06/2019   Social History   Tobacco Use  . Smoking status: Former Smoker    Packs/day: 0.50    Years: 15.00    Pack years: 7.50    Types: Cigarettes    Quit date: 04/11/2019    Years since quitting: 0.2  . Smokeless tobacco: Never Used  Substance Use Topics  . Alcohol use: Never    Frequency: Never    Review of Systems   Constitutional: Negative for chills, fatigue and fever.  HENT: Negative for congestion, ear pain, sinus pain and sore throat.   Eyes: Negative.   Respiratory: Negative for cough, shortness of breath and wheezing.   Cardiovascular: Negative for chest pain, palpitations and leg swelling.  Gastrointestinal: Negative for abdominal pain, diarrhea, nausea and vomiting.  Genitourinary: Negative for dysuria, frequency and urgency.  Musculoskeletal: Negative for arthralgias and myalgias.  Skin: Negative for color change, pallor and rash.  Neurological: Negative for syncope, light-headedness and headaches.  Psychiatric/Behavioral: The patient is not nervous/anxious.       Objective:   Physical Exam  Vitals signs and nursing note reviewed.  Constitutional:      General: She is not in acute distress.    Appearance: She is not ill-appearing, toxic-appearing or diaphoretic.  HENT:     Head: Normocephalic and atraumatic.     Right Ear: Tympanic membrane, ear canal and external ear normal.     Left Ear:  Tympanic membrane, ear canal and external ear normal.  Eyes:     General: No scleral icterus.    Extraocular Movements: Extraocular movements intact.     Conjunctiva/sclera: Conjunctivae normal.     Pupils: Pupils are equal, round, and reactive to light.  Cardiovascular:     Rate and Rhythm: Normal rate and regular rhythm.     Heart sounds: Normal heart sounds.  Pulmonary:     Effort: Pulmonary effort is normal. No respiratory distress.     Breath sounds: Normal breath sounds.  Neurological:     Mental Status: She is alert and oriented to  person, place, and time.     Comments: In wheel chair, +left sided upper and lower extremity weakness related to her CVA  Skin: Groin insertion site from procedure dressing is clean, dry intact. Psychiatric:        Mood and Affect: Mood normal.        Behavior: Behavior normal.        Thought Content: Thought content normal.        Judgment: Judgment normal.    Today's Vitals   07/25/19 1131  BP: (!) 152/78  Pulse: 69  Resp: 18  Temp: (!) 97.5 F (36.4 C)  TempSrc: Temporal  SpO2: 97%  Weight: 160 lb 12.8 oz (72.9 kg)  Height: 5\' 3"  (1.6 m)   Body mass index is 28.48 kg/m.     Assessment & Plan:    We will recheck patient's CBC and BMP in clinic today due to some hypokalemia seen while in hospital.  She will continue regular follow-up with her neurologist and physical therapy.  Overall patient is doing well to make strides since her CVA  1. Acute CVA (cerebrovascular accident) ([place])  - CBC  2. Essential hypertension  - CBC  3. Hypokalemia  - CBC - Basic metabolic panel  4. Carotid stenosis, symptomatic, with infarction ([place])  - CBC   She will keep regular follow-up as planned in October 2020 and she will keep all follow-ups with specialists as scheduled.

## 2019-07-26 ENCOUNTER — Telehealth: Payer: Self-pay | Admitting: Physical Medicine & Rehabilitation

## 2019-07-26 NOTE — Telephone Encounter (Signed)
Resumption of care orders given.  She was re hospitalized for a vascular procedure but they did not mention HH in discharge so I ok'd resumption of care. Patient is supposed to follow up with Dr Letta Pate but did not show for HFU with Zella Ball.

## 2019-07-26 NOTE — Telephone Encounter (Signed)
Colletta Maryland with [facility] needs to get orders for [facility] for patient.  They didn't receive any prior to discharge from hospital.  Please call her at [phone].

## 2019-07-28 ENCOUNTER — Other Ambulatory Visit: Payer: Self-pay

## 2019-07-28 ENCOUNTER — Other Ambulatory Visit (INDEPENDENT_AMBULATORY_CARE_PROVIDER_SITE_OTHER): Payer: PPO

## 2019-07-28 DIAGNOSIS — N179 Acute kidney failure, unspecified: Secondary | ICD-10-CM | POA: Diagnosis not present

## 2019-07-28 DIAGNOSIS — E876 Hypokalemia: Secondary | ICD-10-CM

## 2019-07-28 LAB — BASIC METABOLIC PANEL
BUN: 12 mg/dL (ref 6–23)
CO2: 25 mEq/L (ref 19–32)
Calcium: 8.9 mg/dL (ref 8.4–10.5)
Chloride: 109 mEq/L (ref 96–112)
Creatinine, Ser: 0.84 mg/dL (ref 0.40–1.20)
GFR: 81.47 mL/min (ref >60.00)
Glucose, Bld: 210 mg/dL — ABNORMAL HIGH (ref 70–99)
Potassium: 4 mEq/L (ref 3.5–5.1)
Sodium: 143 mEq/L (ref 135–145)

## 2019-07-28 LAB — POTASSIUM: Potassium: 4 mEq/L (ref 3.5–5.1)

## 2019-07-29 MED ORDER — POTASSIUM CHLORIDE CRYS ER 20 MEQ PO TBCR: 20.0000 meq | EXTENDED_RELEASE_TABLET | Freq: Every day | ORAL | 2 refills | Status: DC

## 2019-07-29 NOTE — Addendum Note (Signed)
Addended by: Philis Nettle on: 07/29/2019 11:47 AM   Modules accepted: Orders

## 2019-08-02 ENCOUNTER — Ambulatory Visit: Payer: PPO | Admitting: Neurology

## 2019-08-04 ENCOUNTER — Other Ambulatory Visit: Payer: PPO

## 2019-08-05 ENCOUNTER — Encounter: Payer: Self-pay | Admitting: Lab

## 2019-08-10 ENCOUNTER — Other Ambulatory Visit (INDEPENDENT_AMBULATORY_CARE_PROVIDER_SITE_OTHER): Payer: Self-pay | Admitting: Vascular Surgery

## 2019-08-10 DIAGNOSIS — I63239 Cerebral infarction due to unspecified occlusion or stenosis of unspecified carotid arteries: Secondary | ICD-10-CM

## 2019-08-11 ENCOUNTER — Encounter (INDEPENDENT_AMBULATORY_CARE_PROVIDER_SITE_OTHER): Payer: PPO

## 2019-08-11 ENCOUNTER — Ambulatory Visit (INDEPENDENT_AMBULATORY_CARE_PROVIDER_SITE_OTHER): Payer: PPO | Admitting: Vascular Surgery

## 2019-08-13 DIAGNOSIS — I639 Cerebral infarction, unspecified: Secondary | ICD-10-CM | POA: Diagnosis not present

## 2019-08-15 ENCOUNTER — Ambulatory Visit (INDEPENDENT_AMBULATORY_CARE_PROVIDER_SITE_OTHER): Payer: PPO

## 2019-08-15 ENCOUNTER — Other Ambulatory Visit: Payer: Self-pay

## 2019-08-15 ENCOUNTER — Encounter (INDEPENDENT_AMBULATORY_CARE_PROVIDER_SITE_OTHER): Payer: Self-pay | Admitting: Nurse Practitioner

## 2019-08-15 ENCOUNTER — Ambulatory Visit (INDEPENDENT_AMBULATORY_CARE_PROVIDER_SITE_OTHER): Payer: PPO | Admitting: Nurse Practitioner

## 2019-08-15 VITALS — BP 199/84 | HR 71 | Resp 16 | Wt 160.0 lb

## 2019-08-15 DIAGNOSIS — I63239 Cerebral infarction due to unspecified occlusion or stenosis of unspecified carotid arteries: Secondary | ICD-10-CM

## 2019-08-15 DIAGNOSIS — E1165 Type 2 diabetes mellitus with hyperglycemia: Secondary | ICD-10-CM

## 2019-08-15 DIAGNOSIS — I10 Essential (primary) hypertension: Secondary | ICD-10-CM

## 2019-08-15 NOTE — Progress Notes (Signed)
SUBJECTIVE:  Patient ID: Tammy Nunez, female    DOB: 10/14/1951, 68 y.o.   MRN: [id number] No chief complaint on file.   HPI  Tammy Nunez is a 68 y.o. female The patient is seen for follow up evaluation of carotid stenosis status post right carotid stemt on 07/20/2019.  There were no post operative problems or complications related to the surgery.  The patient denies neck or incisional pain.  The patient denies interval amaurosis fugax. The patient continues to recover from prior CVA.    The patient denies headache.  The patient is taking enteric-coated aspirin 81 mg daily.  The patient has a history of coronary artery disease, no recent episodes of angina or shortness of breath. The patient denies PAD or claudication symptoms. There is a history of hyperlipidemia which is being treated with a statin.   Duplex ultrasound preoperatively shows 1-39% Left ICA stenosis with 40-59% right ICA, with a patent stent. Past Medical History:  Diagnosis Date  . Chronic left shoulder pain   . Diabetes mellitus without complication ([place])   . Hypertension   . Paralysis ([place])    weakness left upper amd lower extremity  . Stroke [facility])     Past Surgical History:  Procedure Laterality Date  . CAROTID PTA/STENT INTERVENTION Left 07/20/2019   Procedure: CAROTID PTA/STENT INTERVENTION;  Surgeon: Katha Cabal, MD;  Location: [facility];  Service: Cardiovascular;  Laterality: Left;  . ECTOPIC PREGNANCY SURGERY      Social History   Socioeconomic History  . Marital status: Married    Spouse name: Not on file  . Number of children: Not on file  . Years of education: Not on file  . Highest education level: Not on file  Occupational History  . Not on file  Social Needs  . Financial resource strain: Somewhat hard  . Food insecurity    Worry: Never true    Inability: Sometimes true  . Transportation needs    Medical: Yes    Non-medical: Yes  Tobacco Use  . Smoking  status: Former Smoker    Packs/day: 0.50    Years: 15.00    Pack years: 7.50    Types: Cigarettes    Quit date: 04/11/2019    Years since quitting: 0.3  . Smokeless tobacco: Never Used  Substance and Sexual Activity  . Alcohol use: Never    Frequency: Never  . Drug use: Never  . Sexual activity: Not Currently  Lifestyle  . Physical activity    Days per week: 0 days    Minutes per session: 0 min  . Stress: Very much  Relationships  . Social Herbalist on phone: Three times a week    Gets together: Once a week    Attends religious service: 1 to 4 times per year    Active member of club or organization: Yes    Attends meetings of clubs or organizations: 1 to 4 times per year    Relationship status: Married  . Intimate partner violence    Fear of current or ex partner: No    Emotionally abused: No    Physically abused: No    Forced sexual activity: No  Other Topics Concern  . Not on file  Social History Narrative  . Not on file    Family History  Problem Relation Age of Onset  . Diabetes type II Mother   . Hypertension Father   . Diabetes type II Brother  No Known Allergies   Review of Systems   Review of Systems: Negative Unless Checked Constitutional: _0 Weight loss  _1 Fever  _2 Chills Cardiac: _3 Chest pain   _4  Atrial Fibrillation  _5 Palpitations   _6 Shortness of breath when laying flat   _7 Shortness of breath with exertion. _8 Shortness of breath at rest Vascular:  _9 Pain in legs with walking   _10 Pain in legs with standing _11 Pain in legs when laying flat   _12 Claudication    _13 Pain in feet when laying flat    _14 History of DVT   _15 Phlebitis   _16 Swelling in legs   _17 Varicose veins   _18 Non-healing ulcers Pulmonary:   _19 Uses home oxygen   _20 Productive cough   _21 Hemoptysis   _22 Wheeze  _23 COPD   _24 Asthma Neurologic:  _25 Dizziness   _26 Seizures  _27 Blackouts _28 History of stroke   _29 History of TIA  _30 Aphasia   _31 Temporary Blindness   _32 Weakness or numbness in arm    _33 Weakness or numbness in leg Musculoskeletal:   _34 Joint swelling   _35 Joint pain   _36 Low back pain  _37  History of Knee Replacement _38 Arthritis _39 back Surgeries  _40  Spinal Stenosis    Hematologic:  _41 Easy bruising  _42 Easy bleeding   _43 Hypercoagulable state   _44 Anemic Gastrointestinal:  _45 Diarrhea   _46 Vomiting  _47 Gastroesophageal reflux/heartburn   _48 Difficulty swallowing. _49 Abdominal pain Genitourinary:  _50 Chronic kidney disease   _51 Difficult urination  _52 Anuric   _53 Blood in urine _54 Frequent urination  _55 Burning with urination   _56 Hematuria Skin:  _57 Rashes   _58 Ulcers _59 Wounds Psychological:  _60 History of anxiety   _61  History of major depression  _62  Memory Difficulties      OBJECTIVE:   Physical Exam  There were no vitals taken for this visit.  Gen: WD/WN, NAD Head: [place]/AT, No temporalis wasting.  Ear/Nose/Throat: Hearing grossly intact, nares w/o erythema or drainage Eyes: PER, EOMI, sclera nonicteric.  Neck: Supple, no masses.  No JVD.  Pulmonary:  Good air movement, no use of accessory muscles.  Cardiac: RRR Vascular:  Vessel Right Left  Radial Palpable Palpable  Gastrointestinal: soft, non-distended. No guarding/no peritoneal signs.  Musculoskeletal: Left sided weakness.  No deformity or atrophy.  Neurologic: Pain and light touch intact in extremities.  Symmetrical.  Speech is fluent. Motor exam as listed above. Psychiatric: Judgment intact, Mood & affect appropriate for pt's clinical situation. Dermatologic: No Venous rashes. No Ulcers Noted.  No changes consistent with cellulitis. Lymph : No Cervical lymphadenopathy, no lichenification or skin changes of chronic lymphedema.       ASSESSMENT AND PLAN:  1. Carotid stenosis, symptomatic, with infarction [facility]) Recommend:  The patient is s/p successful right carotid stent  Duplex ultrasound preoperatively shows 1-39% Left ICA stenosis with 40-59% right ICA, with a patent stent.  Continue antiplatelet therapy as prescribed  Continue management of CAD, HTN and Hyperlipidemia Healthy heart diet,  encouraged exercise at least 4 times per week  Follow up in 3 months with duplex ultrasound and physical exam based on the patient's carotid stent - VAS US CAROTID; Future  2. Essential hypertension Continue antihypertensive medications as already ordered, these medications have been reviewed and there are no changes at this time.   3. Type 2 diabetes mellitus with hyperglycemia, without long-term current use of insulin (HCC) Continue hypoglycemic medications as already ordered, these medications have been reviewed and there are no changes at this time.  Hgb A1C to be monitored as already arranged by primary service    Current Outpatient Medications on File Prior to Visit  Medication Sig Dispense Refill  .  acetaminophen (TYLENOL) 325 MG tablet Take 1-2 tablets (325-650 mg total) by mouth every 4 (four) hours as needed for mild pain.    Marland Kitchen aspirin EC 81 MG EC tablet Take 1 tablet (81 mg total) by mouth daily.    Marland Kitchen atorvastatin (LIPITOR) 40 MG tablet Take 1 tablet (40 mg total) by mouth daily at 6 PM. 30 tablet 1  . blood glucose meter kit and supplies Dispense based on patient and insurance preference. Use up to four times daily as directed. (FOR ICD-10 E10.9, E11.9). 1 each 0  . carvedilol (COREG) 6.25 MG tablet Take 1 tablet (6.25 mg total) by mouth 2 (two) times daily with a meal. 60 tablet 3  . clopidogrel (PLAVIX) 75 MG tablet Take 1 tablet (75 mg total) by mouth daily. 30 tablet 1  . ferrous sulfate 325 (65 FE) MG tablet Take 1 tablet (325 mg total) by mouth daily with breakfast. 90 tablet 0  . glimepiride (AMARYL) 2 MG tablet Take 1 tablet (2 mg total) by mouth daily with breakfast. 30 tablet 1  . levETIRAcetam (KEPPRA) 1000 MG tablet Take 1 tablet (1,000 mg total) by mouth 2 (two) times daily. 60 tablet 1  . lisinopril (ZESTRIL) 20 MG tablet Take 1 tablet (20 mg total) by mouth daily. 30 tablet 1  . potassium  chloride SA (KLOR-CON M20) 20 MEQ tablet Take 1 tablet (20 mEq total) by mouth daily. 30 tablet 2  . vitamin B-12 (CYANOCOBALAMIN) 1000 MCG tablet Take 1 tablet (1,000 mcg total) by mouth daily. 30 tablet 1   No current facility-administered medications on file prior to visit.     There are no Patient Instructions on file for this visit. No follow-ups on file.   Kris Hartmann, NP  This note was completed with Sales executive.  Any errors are purely unintentional.

## 2019-08-29 DIAGNOSIS — R531 Weakness: Secondary | ICD-10-CM | POA: Diagnosis not present

## 2019-08-29 DIAGNOSIS — Z8673 Personal history of transient ischemic attack (TIA), and cerebral infarction without residual deficits: Secondary | ICD-10-CM | POA: Diagnosis not present

## 2019-08-30 ENCOUNTER — Other Ambulatory Visit: Payer: Self-pay | Admitting: Family Medicine

## 2019-08-30 MED ORDER — ATORVASTATIN CALCIUM 40 MG PO TABS: 40.0000 mg | ORAL_TABLET | Freq: Every day | ORAL | 1 refills | Status: DC

## 2019-08-30 MED ORDER — CLOPIDOGREL BISULFATE 75 MG PO TABS: 75.0000 mg | ORAL_TABLET | Freq: Every day | ORAL | 1 refills | Status: DC

## 2019-08-30 MED ORDER — GLIMEPIRIDE 2 MG PO TABS: 2.0000 mg | ORAL_TABLET | Freq: Every day | ORAL | 1 refills | Status: DC

## 2019-08-30 NOTE — Telephone Encounter (Signed)
Medication Refill - Medication: glimepiride (AMARYL) 2 MG tablet, atorvastatin (LIPITOR) 40 MG tablet, clopidogrel (PLAVIX) 75 MG tablet  Pt's husband states pt only has enough for one more day of these medications and would like to have them filled as soon as possible, please.   Preferred Pharmacy:  [facility] [address]), [place] [phone] (Phone) [phone] (Fax)     Pt was advised that RX refills may take up to 3 business days. We ask that you follow-up with your pharmacy.

## 2019-09-07 DIAGNOSIS — Z8673 Personal history of transient ischemic attack (TIA), and cerebral infarction without residual deficits: Secondary | ICD-10-CM | POA: Insufficient documentation

## 2019-09-07 DIAGNOSIS — G8194 Hemiplegia, unspecified affecting left nondominant side: Secondary | ICD-10-CM | POA: Insufficient documentation

## 2019-09-08 ENCOUNTER — Other Ambulatory Visit: Payer: Self-pay

## 2019-09-12 ENCOUNTER — Telehealth: Payer: Self-pay | Admitting: Family Medicine

## 2019-09-12 ENCOUNTER — Ambulatory Visit: Payer: Self-pay | Admitting: Pharmacist

## 2019-09-12 ENCOUNTER — Other Ambulatory Visit: Payer: Self-pay

## 2019-09-12 ENCOUNTER — Encounter: Payer: Self-pay | Admitting: Family Medicine

## 2019-09-12 ENCOUNTER — Ambulatory Visit (INDEPENDENT_AMBULATORY_CARE_PROVIDER_SITE_OTHER): Payer: PPO | Admitting: Family Medicine

## 2019-09-12 VITALS — BP 140/90 | HR 78 | Temp 98.3°F | Wt 157.4 lb

## 2019-09-12 DIAGNOSIS — Z8673 Personal history of transient ischemic attack (TIA), and cerebral infarction without residual deficits: Secondary | ICD-10-CM

## 2019-09-12 DIAGNOSIS — R569 Unspecified convulsions: Secondary | ICD-10-CM | POA: Diagnosis not present

## 2019-09-12 DIAGNOSIS — I639 Cerebral infarction, unspecified: Secondary | ICD-10-CM

## 2019-09-12 MED ORDER — LEVETIRACETAM 1000 MG PO TABS: 1000.0000 mg | ORAL_TABLET | Freq: Two times a day (BID) | ORAL | 2 refills | Status: DC

## 2019-09-12 NOTE — Progress Notes (Signed)
Subjective:    Patient ID: Tammy Nunez, female    DOB: 1951/07/09, 68 y.o.   MRN: [id number]  HPI   Patient presents to clinic for follow-up.  Overall she has been recovering well since her CVA.  Is followed regularly with neurology.  Has not had any breakthrough seizure activity.  Wondering if she does need to continue her Keppra, unsure neurology appointment.  Blood pressures have been well controlled on current medicines.  Denies any chest pain, shortness of breath, palpitations or lower extremity swelling.  Patient does get around in a wheelchair and has good support from her husband.  Patient wondering if she would qualify for any disability benefits to Social Security since her stroke.  Patient Active Problem List   Diagnosis Date Noted  . Carotid stenosis, symptomatic, with infarction ([place]) 07/20/2019  . Type 2 diabetes mellitus with hyperglycemia, without long-term current use of insulin ([place])   . AKI (acute kidney injury) ([place])   . Essential hypertension   . Seizures ([place])   . New onset type 2 diabetes mellitus ([place])   . Acute ischemic right MCA stroke ([place]) 06/09/2019  . Acute CVA (cerebrovascular accident) ([place]) 06/06/2019   Social History   Tobacco Use  . Smoking status: Former Smoker    Packs/day: 0.50    Years: 15.00    Pack years: 7.50    Types: Cigarettes    Quit date: 04/11/2019    Years since quitting: 0.4  . Smokeless tobacco: Never Used  Substance Use Topics  . Alcohol use: Never    Frequency: Never   Review of Systems  Constitutional: Negative for chills, fatigue and fever.  HENT: Negative for congestion, ear pain, sinus pain and sore throat.   Eyes: Negative.   Respiratory: Negative for cough, shortness of breath and wheezing.   Cardiovascular: Negative for chest pain, palpitations and leg swelling.  Gastrointestinal: Negative for abdominal pain, diarrhea, nausea and vomiting.  Genitourinary: Negative for dysuria, frequency and urgency.   Musculoskeletal: Negative for arthralgias and myalgias.  Skin: Negative for color change, pallor and rash.  Neurological: Negative for syncope, light-headedness and headaches.  Psychiatric/Behavioral: The patient is not nervous/anxious.       Objective:   Physical Exam Vitals signs and nursing note reviewed.  Constitutional:      General: She is not in acute distress.    Appearance: She is not toxic-appearing.  HENT:     Head: Normocephalic and atraumatic.  Eyes:     General: No scleral icterus.    Extraocular Movements: Extraocular movements intact.     Conjunctiva/sclera: Conjunctivae normal.  Cardiovascular:     Rate and Rhythm: Normal rate and regular rhythm.  Pulmonary:     Effort: Pulmonary effort is normal. No respiratory distress.     Breath sounds: Normal breath sounds.  Neurological:     Mental Status: She is alert and oriented to person, place, and time. Mental status is at baseline.     Comments: Decreased ROM in LUE and LLE as residual from CVA  Psychiatric:        Mood and Affect: Mood normal.        Behavior: Behavior normal.     Today's Vitals   09/12/19 1004  BP: 140/90  Pulse: 78  Temp: 98.3 F (36.8 C)  SpO2: 98%  Weight: 157 lb 6.4 oz (71.4 kg)   Body mass index is 27.88 kg/m.     Assessment & Plan:    CVA/seizure history-advised patient that  due to her CVA and history of seizures, I would recommend she continue Keppra.  Also recommended she reach out to her neurologist Dr. Melrose Nakayama to confirm he would like her to remain on this medicine.  Phone number for Dr. Lannie Fields office given to patient.  I did send a refill in the pharmacy so patient did not run out of her Keppra.  Also place referral to care management to help patient navigate resources in the community and possibly look into if she would qualify for any additional money from Social Security due to her disability.  Patient will follow-up in 3 months for regular recheck and return to clinic  sooner if any issues arise.  Declines flu vaccine.

## 2019-09-12 NOTE — Telephone Encounter (Signed)
Pt called Tammy Nunez stated she is not having problems with her head.  She wants to know if she still needs to take the levETIRAcetam (KEPPRA) 1000 MG tablet medication.  She took the last one Aug 02, 2019.  Dr. Lannie Fields office never returned her call.  Please advise.

## 2019-09-12 NOTE — Telephone Encounter (Signed)
I recommend holding off on re-starting the Keppra 1000 mg 2x per day until Dr Weber Cooks office returns call  I did not see mention of yes or no to continue medication in his last office note, so it is unclear of what they want to do

## 2019-09-12 NOTE — Telephone Encounter (Signed)
Patient stated she is not having problems with her head.  She wants to know if she still needs to take the levETIRAcetam (KEPPRA) 1000 MG tablet medication.  She took the last one Aug 02, 2019.  Dr. Lannie Fields office never returned her call.  Please advise.

## 2019-09-12 NOTE — Telephone Encounter (Signed)
Called Pt and spoke to her husband. Pt husband stated Dr. Melrose Nakayama called back and told the Pt she No longer need to take the [place].

## 2019-09-12 NOTE — Chronic Care Management (AMB) (Signed)
  Chronic Care Management   Note  09/12/2019 Name: [name] MRN: [id number] DOB: 10/19/1951  [name] is a 67 y.o. year old female who is a primary care patient of Guse, Jacquelynn Cree, FNP. The CCM team was consulted for assistance with chronic disease management and care coordination needs.    Attempted to contact patient to discuss CCM team and resource needs. Left HIPAA compliant message for patient to return my call.   Follow up plan: - If I do not hear back, will outreach patient again in the next 1-2 weeks  Catie [name], PharmD, [place] Pharmacist [place] [phone]

## 2019-09-12 NOTE — Telephone Encounter (Signed)
Ok thanks, can we call pharmacy to cancel the keppra Rx?  LG

## 2019-09-13 ENCOUNTER — Other Ambulatory Visit: Payer: Self-pay | Admitting: Lab

## 2019-09-13 NOTE — Telephone Encounter (Signed)
Called pharmacy and Cancelled Keppra Rx

## 2019-09-22 ENCOUNTER — Ambulatory Visit (INDEPENDENT_AMBULATORY_CARE_PROVIDER_SITE_OTHER): Payer: PPO | Admitting: Pharmacist

## 2019-09-22 ENCOUNTER — Other Ambulatory Visit: Payer: Self-pay | Admitting: Family Medicine

## 2019-09-22 DIAGNOSIS — E119 Type 2 diabetes mellitus without complications: Secondary | ICD-10-CM

## 2019-09-22 DIAGNOSIS — I10 Essential (primary) hypertension: Secondary | ICD-10-CM

## 2019-09-22 MED ORDER — LISINOPRIL 20 MG PO TABS: 20.0000 mg | ORAL_TABLET | Freq: Every day | ORAL | 3 refills | Status: DC

## 2019-09-22 MED ORDER — METFORMIN HCL ER 500 MG PO TB24: 500.0000 mg | ORAL_TABLET | Freq: Every day | ORAL | 1 refills | Status: DC

## 2019-09-22 MED ORDER — BLOOD GLUCOSE METER KIT: PACK | 0 refills | Status: DC

## 2019-09-22 NOTE — Chronic Care Management (AMB) (Signed)
Chronic Care Management   Note  09/22/2019 Name: Tammy Nunez MRN: [id number] DOB: [birth year]   Subjective:  Tammy Nunez is a 68 y.o. year old female who is a primary care patient of Guse, Jacquelynn Cree, FNP. The CCM team was consulted for assistance with chronic disease management and care coordination needs.    Contacted patient today for medication management needs.   Tammy Nunez was given information about Chronic Care Management services today including:  1. CCM service includes personalized support from designated clinical staff supervised by her physician, including individualized plan of care and coordination with other care providers 2. 24/7 contact phone numbers for assistance for urgent and routine care needs. 3. Service will only be billed when office clinical staff spend 20 minutes or more in a month to coordinate care. 4. Only one practitioner may furnish and bill the service in a calendar month. 5. The patient may stop CCM services at any time (effective at the end of the month) by phone call to the office staff. 6. The patient will be responsible for cost sharing (co-pay) of up to 20% of the service fee (after annual deductible is met).  Patient agreed to services and verbal consent obtained.   Review of patient status, including review of consultants reports, laboratory and other test data, was performed as part of comprehensive evaluation and provision of chronic care management services.   Objective:  Lab Results  Component Value Date   CREATININE 0.84 07/28/2019   CREATININE 0.71 07/25/2019   CREATININE 0.78 07/21/2019    Lab Results  Component Value Date   HGBA1C 9.3 (H) 06/07/2019       Component Value Date/Time   CHOL 305 (H) 06/07/2019 0836   TRIG 244 (H) 06/07/2019 0836   HDL 39 (L) 06/07/2019 0836   CHOLHDL 7.8 06/07/2019 0836   VLDL 49 (H) 06/07/2019 0836   LDLCALC 217 (H) 06/07/2019 0836    Clinical ASCVD: Yes - CVA 05/2019  BP  Readings from Last 3 Encounters:  09/12/19 140/90  08/15/19 (!) 199/84  07/25/19 (!) 152/78    No Known Allergies  Medications Reviewed Today    Reviewed by De Hollingshead, [facility] (Pharmacist) on 09/22/19 at [place] List Status: <None>  Medication Order Taking? Sig Documenting Provider Last Dose Status Informant  acetaminophen (TYLENOL) 325 MG tablet [id number] No Take 1-2 tablets (325-650 mg total) by mouth every 4 (four) hours as needed for mild pain.  Patient not taking: Reported on 09/22/2019   Flora Lipps Not Taking Active Pharmacy Records  aspirin EC 81 MG EC tablet [id number] Yes Take 1 tablet (81 mg total) by mouth daily. Hillary Bow, MD Taking Active Pharmacy Records  atorvastatin (LIPITOR) 40 MG tablet [id number] Yes Take 1 tablet (40 mg total) by mouth daily at 6 PM. Guse, Jacquelynn Cree, FNP Taking Active   blood glucose meter kit and supplies [id number] Yes Dispense based on patient and insurance preference. Use up to four times daily as directed. (FOR ICD-10 E10.9, E11.9). [name], FNP Taking Active Pharmacy Records  carvedilol (COREG) 6.25 MG tablet [id number] Yes Take 1 tablet (6.25 mg total) by mouth 2 (two) times daily with a meal. Guse, Jacquelynn Cree, FNP Taking Active Pharmacy Records  clopidogrel (PLAVIX) 75 MG tablet [id number] Yes Take 1 tablet (75 mg total) by mouth daily. [name], FNP Taking Active   glimepiride (AMARYL) 2 MG tablet [id number] Yes Take 1 tablet (2 mg total) by mouth daily with  breakfast. [name], FNP Taking Active   lisinopril (ZESTRIL) 20 MG tablet [id number] No Take 1 tablet (20 mg total) by mouth daily.  Patient not taking: Reported on 09/22/2019   Flora Lipps Not Taking Active Pharmacy Records  potassium chloride SA (KLOR-CON M20) 20 MEQ tablet [id number] Yes Take 1 tablet (20 mEq total) by mouth daily. [name], FNP Taking Active   vitamin B-12 (CYANOCOBALAMIN) 1000 MCG tablet [id number] Yes Take 1 tablet (1,000 mcg  total) by mouth daily. Flora Lipps Taking Active Pharmacy Records  Med List Note Dalphine Handing 07/20/19 8343): Pt asked me to review meds with husband but he left. She said she did not take any meds today.            Assessment:   Goals Addressed            This Visit's Progress     Patient Stated   . "I want to stay healthy" (pt-stated)       Current Barriers:  . Complicated patient with multiple uncontrolled conditions; s/p CVA in 05/2019 . Diabetes: uncontrolled most recent A1c 9.2%  . Current antihyperglycemic regimen: glimepiride 2 mg QAM o Appears metformin IR was d/c in hospital d/t AKI and loose stools. o EGFR now sufficient for metformin therapy . Denies hypoglycemic symptoms, including dizziness, lightheadedness, shaking, sweating . Current blood glucose readings: Not checking, does not have a glucometer . Cardiovascular risk reduction: o Current hypertensive regimen: carvedilol 6.25 mg BID; NOT taking lisinopril 20 mg daily, 60 day supply last filled 07/01/2019 o Current hyperlipidemia regimen: atorvastatin 40 mg daily; baseline LDL >200 in 05/2019; updated lipid panel has not been checked o Current antiplatelet regimen: ASA 81 mg daily, clopidogrel 75 mg daily; refill history indicates adherence   Pharmacist Clinical Goal(s):  Marland Kitchen Over the next 90 days, patient will work with PharmD and primary care provider to address optimized ASCVD risk reduction  Interventions: . Comprehensive medication review performed, medication list updated in electronic medical record . Noted that patient ran out of lisinopril. Will collaborate with Philis Nettle, NP for refills . Patient does not have glucometer or BP meter at home. Contacted [facility], they do not have a glucometer prescription on file. Will collaborate with Philis Nettle, NP for a new prescription for glucometer. Will research options to help patient with BP meter assistance . Recommend restarting metformin,  recommend metformin XR 500 mg daily. Will be due for follow up with Philis Nettle (also recommend A1c, lipid panel at that point) in January 2020. Marland Kitchen Discussed all of the above with patient. Provided written copy of these instructions, along with my contact information . Moving forward, will further review diabetes pathophysiology, recognition and treatment of hypoglycemia, goal fasting and post prandial glucose, metformin titration, etc.   Patient Self Care Activities:  . Patient will check blood glucose daily, document, and provide at future appointments . Patient will take medications as prescribed . Patient will report any questions or concerns to provider   Initial goal documentation        Plan: - Will outreach patient in 3-4 weeks for continued medication management support  Catie [name], PharmD, [place] Pharmacist [place] [phone]

## 2019-09-22 NOTE — Patient Instructions (Addendum)
Tammy Nunez,   It was great talking to you today! We talked about a couple of different things:   1) Diabetes: we keep an eye on your A1c, which shows how well controlled your sugars have been over the previous 3 months. Your last A1c was 9.2%; we would like to be able to get it to less than 7%; this is the best way to prevent the long term problems from diabetes: kidney disease, nerve damage, heart attacks, or another stroke. When you get your blood sugar meter, please start checking at least once daily, first thing in the morning. We would like for these readings to generally be less than 130.   Tammy Nunez would like to add a new medication for diabetes: metformin extended release. Take this when you have food on your stomach.  2) Blood pressure: We want you to be on both carvedilol and lisinopril for blood pressure. Tammy Nunez sent a refill of lisinopril to [facility].   Here are what each of your medications are for:  - Aspirin: blood thinner/stroke prevention - Clopidogrel: blood thinner/stroke prevention - Atorvastatin: cholesterol/stroke prevention - Carvedilol: blood pressure/heart rate - Lisinopril: blood pressure - Glimepiride: blood sugar - Metformin: blood sugar - Potassium: low potassium level - Vitamin B12: low vitamin B12 level  Please feel free to call me with any questions or concerns!  Visit Information  Goals Addressed            This Visit's Progress     Patient Stated   . "I want to stay healthy" (pt-stated)       Current Barriers:  . Complicated patient with multiple uncontrolled conditions; s/p CVA in 05/2019 . Diabetes: uncontrolled most recent A1c 9.2%  . Current antihyperglycemic regimen: glimepiride 2 mg QAM o Appears metformin IR was d/c in hospital d/t AKI and loose stools. o EGFR now sufficient for metformin therapy . Denies hypoglycemic symptoms, including dizziness, lightheadedness, shaking, sweating . Current blood glucose readings: Not checking, does  not have a glucometer . Cardiovascular risk reduction: o Current hypertensive regimen: carvedilol 6.25 mg BID; NOT taking lisinopril 20 mg daily, 60 day supply last filled 07/01/2019 o Current hyperlipidemia regimen: atorvastatin 40 mg daily; baseline LDL >200 in 05/2019; updated lipid panel has not been checked o Current antiplatelet regimen: ASA 81 mg daily, clopidogrel 75 mg daily; refill history indicates adherence   Pharmacist Clinical Goal(s):  Marland Kitchen Over the next 90 days, patient will work with PharmD and primary care provider to address optimized ASCVD risk reduction  Interventions: . Comprehensive medication review performed, medication list updated in electronic medical record . Noted that patient ran out of lisinopril. Will collaborate with Tammy Nettle, NP for refills . Patient does not have glucometer or BP meter at home. Contacted [facility], they do not have a glucometer prescription on file. Will collaborate with Tammy Nettle, NP for a new prescription for glucometer. Will research options to help patient with BP meter assistance . Recommend restarting metformin, recommend metformin XR 500 mg daily. Will be due for follow up with Tammy Nunez (also recommend A1c, lipid panel at that point) in January 2020. Marland Kitchen Discussed all of the above with patient. Provided written copy of these instructions, along with my contact information . Moving forward, will further review diabetes pathophysiology, recognition and treatment of hypoglycemia, goal fasting and post prandial glucose, metformin titration, etc.   Patient Self Care Activities:  . Patient will check blood glucose daily, document, and provide at future appointments . Patient will take  medications as prescribed . Patient will report any questions or concerns to provider   Initial goal documentation        Tammy Nunez was given information about Chronic Care Management services today including:  1. CCM service includes personalized  support from designated clinical staff supervised by her physician, including individualized plan of care and coordination with other care providers 2. 24/7 contact phone numbers for assistance for urgent and routine care needs. 3. Service will only be billed when office clinical staff spend 20 minutes or more in a month to coordinate care. 4. Only one practitioner may furnish and bill the service in a calendar month. 5. The patient may stop CCM services at any time (effective at the end of the month) by phone call to the office staff. 6. The patient will be responsible for cost sharing (co-pay) of up to 20% of the service fee (after annual deductible is met).  Patient agreed to services and verbal consent obtained.   Print copy of patient instructions provided.   Plan: - Will outreach patient in 3-4 weeks for continued medication management support  Catie [name], PharmD, [place] Pharmacist [facility] [place] [phone]

## 2019-09-23 ENCOUNTER — Telehealth: Payer: Self-pay | Admitting: Family Medicine

## 2019-09-23 DIAGNOSIS — E119 Type 2 diabetes mellitus without complications: Secondary | ICD-10-CM

## 2019-09-23 NOTE — Telephone Encounter (Signed)
Copied from [place] [phone]. Topic: General - Other >> Sep 23, 2019 12:42 PM Keene Breath wrote: Reason for CRM: Pharmacy called to ask that the nurse call with new directions for the patient's prescription for insurance purposes.  Please call to discuss at [phone]

## 2019-09-27 MED ORDER — BLOOD GLUCOSE METER KIT: 1.0000 | PACK | Freq: Four times a day (QID) | 0 refills | Status: AC

## 2019-09-27 NOTE — Telephone Encounter (Signed)
New Rx signed by Chauncy Passy, [place] faxed to [facility].

## 2019-09-27 NOTE — Telephone Encounter (Signed)
So she will need to check blood sugar every day, up to four times per day for rest of life.  Not sure what they are saying about # of days.  Can you re-send Rx?

## 2019-09-27 NOTE — Telephone Encounter (Signed)
Called and spoke to [place] at [facility].  Stated that Rx for blood glucose meter kit and supplies needs to have a specific number of days in order for Medicare to pay. Rx needs to be for a specific number of days not "up to 4 days".  Requesting a new Rx be sent back to pharmacy.

## 2019-10-03 ENCOUNTER — Telehealth: Payer: Self-pay

## 2019-10-03 NOTE — Telephone Encounter (Signed)
Ok to change

## 2019-10-03 NOTE — Telephone Encounter (Signed)
This is fine with me   

## 2019-10-03 NOTE — Telephone Encounter (Signed)
Ok to change  Schedule appt please   Tammy Nunez

## 2019-10-03 NOTE — Telephone Encounter (Signed)
Copied from [place] [phone]. Topic: Appointment Scheduling - Transfer of Care >> Oct 03, 2019 10:12 AM Richardo Priest, [place] wrote: Pt is requesting to transfer FROM: Lauren Guse/ [place] Pt is requesting to transfer TO: Dr.Tracy/ [place] Reason for requested transfer: PCP is moving   Send CRM to patient's current PCP (transferring FROM).

## 2019-10-10 ENCOUNTER — Other Ambulatory Visit: Payer: Self-pay | Admitting: Family Medicine

## 2019-10-10 ENCOUNTER — Ambulatory Visit (INDEPENDENT_AMBULATORY_CARE_PROVIDER_SITE_OTHER): Payer: PPO | Admitting: Pharmacist

## 2019-10-10 DIAGNOSIS — I639 Cerebral infarction, unspecified: Secondary | ICD-10-CM

## 2019-10-10 DIAGNOSIS — E119 Type 2 diabetes mellitus without complications: Secondary | ICD-10-CM | POA: Diagnosis not present

## 2019-10-10 DIAGNOSIS — I10 Essential (primary) hypertension: Secondary | ICD-10-CM

## 2019-10-10 MED ORDER — CARVEDILOL 6.25 MG PO TABS: 6.2500 mg | ORAL_TABLET | Freq: Two times a day (BID) | ORAL | 3 refills | Status: DC

## 2019-10-10 MED ORDER — METFORMIN HCL ER 500 MG PO TB24: 1000.0000 mg | ORAL_TABLET | Freq: Every day | ORAL | 1 refills | Status: DC

## 2019-10-10 NOTE — Chronic Care Management (AMB) (Signed)
Chronic Care Management   Follow Up Note   10/10/2019 Name: Tammy Nunez MRN: [id number] DOB: 1951-10-20  Referred by: McLean-Scocuzza, Nino Glow, MD - current PCP Philis Nettle, NP Reason for referral : Chronic Care Management (Medication Management)   Tammy Nunez is a 68 y.o. year old female who is a primary care patient of McLean-Scocuzza, Nino Glow, MD. The CCM team was consulted for assistance with chronic disease management and care coordination needs.    Contacted patient for medication management review.  Review of patient status, including review of consultants reports, relevant laboratory and other test results, and collaboration with appropriate care team members and the patient's provider was performed as part of comprehensive patient evaluation and provision of chronic care management services.    SDOH (Social Determinants of Health) screening performed today: Financial Strain . See Care Plan for related entries.   Outpatient Encounter Medications as of 10/10/2019  Medication Sig  . aspirin EC 81 MG EC tablet Take 1 tablet (81 mg total) by mouth daily.  . blood glucose meter kit and supplies 1 each by Other route 4 (four) times daily. Dispense based on patient and insurance preference. Four times daily as directed. (FOR ICD-10 E10.9, E11.9).  . carvedilol (COREG) 6.25 MG tablet Take 1 tablet (6.25 mg total) by mouth 2 (two) times daily with a meal.  . clopidogrel (PLAVIX) 75 MG tablet Take 1 tablet (75 mg total) by mouth daily.  Marland Kitchen glimepiride (AMARYL) 2 MG tablet Take 1 tablet (2 mg total) by mouth daily with breakfast.  . lisinopril (ZESTRIL) 20 MG tablet Take 1 tablet (20 mg total) by mouth daily.  . metFORMIN (GLUCOPHAGE XR) 500 MG 24 hr tablet Take 1 tablet (500 mg total) by mouth daily with breakfast.  . potassium chloride SA (KLOR-CON M20) 20 MEQ tablet Take 1 tablet (20 mEq total) by mouth daily.  . vitamin B-12 (CYANOCOBALAMIN) 1000 MCG tablet Take 1 tablet (1,000  mcg total) by mouth daily.  Marland Kitchen acetaminophen (TYLENOL) 325 MG tablet Take 1-2 tablets (325-650 mg total) by mouth every 4 (four) hours as needed for mild pain. (Patient not taking: Reported on 09/22/2019)  . atorvastatin (LIPITOR) 40 MG tablet Take 1 tablet (40 mg total) by mouth daily at 6 PM.   No facility-administered encounter medications on file as of 10/10/2019.      Goals Addressed            This Visit's Progress     Patient Stated   . "I want to stay healthy" (pt-stated)       Current Barriers:  . Complicated patient with multiple uncontrolled conditions; s/p CVA in 05/2019 . Diabetes: uncontrolled, most recent A1c 9.3%  . Current antihyperglycemic regimen: glimepiride 2 mg QAM, metformin XR 500 mg daily . Denies hypoglycemic symptoms, including dizziness, lightheadedness, shaking, sweating . Current meal patterns: o Breakfast: Egg, [place] sausage; sometimes coffee w/ sweet and low and a little cream o Lunch: Baked chicken, green beans; sometimes tuna sandwich w/ whole wheat;  o Supper: meat loaf; BBQ chicken, green beans, peas, has a small serving of potatoes   o Snacks: Apple, pear;  o Drinks: Water;  . Current blood glucose readings: Not checking, has not been able to pick up a glucometer yet because the pharmacy required clarification from the provider office; plans to do so later this week . Cardiovascular risk reduction: o Current hypertensive regimen: carvedilol 6.25 mg BID; lisinopril 20 mg daily o Current hyperlipidemia regimen: atorvastatin 40 mg daily;  baseline LDL >200 in 05/2019; updated lipid panel has not been checked o Current antiplatelet regimen: ASA 81 mg daily, clopidogrel 75 mg daily  Pharmacist Clinical Goal(s):  Marland Kitchen Over the next 90 days, patient will work with PharmD and primary care provider to address optimized ASCVD risk reduction  Interventions: . Comprehensive medication review performed, medication list updated in electronic medical record .  Educated on goal A1c, goal fasting glucose and goal post prandial glucose.  Marland Kitchen Discussed risk of hypoglycemia w/ glimepiride. As patient is tolerating metformin, recommend increase metformin to 500 mg BID and d/c glimepiride. Will collaborate w/ Philis Nettle, NP on this. . Due for refill on carvedilol. Called pharmacy, asked to refill. They request a script for a 90 day supply; will collaborate w/ Philis Nettle, NP on this . Reviewed outside meds. Patient up to date on fills for lisinopril, carvedilol, atorvastatin, potassium, clopidogrel, and metformin . Encouraged to pick up glucometer and start checking glucose daily. Pharmacy notes that they need her Medicare A/B card to run glucometer. Contacted patient to explain this.  . Extensive dietary discussion. Reviewed fruits that are higher in glucose. Discussed to focus on smaller portion sizes of carbohydrates. Praised for low-carb breakfast options and choice of whole wheat bread . Patient has f/u appointment w/ Dr. Terese Door in February; recommend updated lipid panel as well as other pertinent labs; increase atorvastatin if LDL not at goal  Patient Self Care Activities:  . Patient will check blood glucose daily, document, and provide at future appointments . Patient will take medications as prescribed . Patient will report any questions or concerns to provider   Please see past updates related to this goal by clicking on the "Past Updates" button in the selected goal         Plan:  - Will follow up with patient in ~4 weeks for continued medication management support  Catie [name], PharmD, [place] Pharmacist [place] [phone]

## 2019-10-10 NOTE — Patient Instructions (Signed)
Visit Information  Goals Addressed            This Visit's Progress     Patient Stated   . "I want to stay healthy" (pt-stated)       Current Barriers:  . Complicated patient with multiple uncontrolled conditions; s/p CVA in 05/2019 . Diabetes: uncontrolled, most recent A1c 9.3%  . Current antihyperglycemic regimen: glimepiride 2 mg QAM, metformin XR 500 mg daily . Denies hypoglycemic symptoms, including dizziness, lightheadedness, shaking, sweating . Current meal patterns: o Breakfast: Egg, [place] sausage; sometimes coffee w/ sweet and low and a little cream o Lunch: Baked chicken, green beans; sometimes tuna sandwich w/ whole wheat;  o Supper: meat loaf; BBQ chicken, green beans, peas, has a small serving of potatoes   o Snacks: Apple, pear;  o Drinks: Water;  . Current blood glucose readings: Not checking, has not been able to pick up a glucometer yet because the pharmacy required clarification from the provider office; plans to do so later this week . Cardiovascular risk reduction: o Current hypertensive regimen: carvedilol 6.25 mg BID; lisinopril 20 mg daily o Current hyperlipidemia regimen: atorvastatin 40 mg daily; baseline LDL >200 in 05/2019; updated lipid panel has not been checked o Current antiplatelet regimen: ASA 81 mg daily, clopidogrel 75 mg daily  Pharmacist Clinical Goal(s):  Marland Kitchen Over the next 90 days, patient will work with PharmD and primary care provider to address optimized ASCVD risk reduction  Interventions: . Comprehensive medication review performed, medication list updated in electronic medical record . Educated on goal A1c, goal fasting glucose and goal post prandial glucose.  Marland Kitchen Discussed risk of hypoglycemia w/ glimepiride. As patient is tolerating metformin, recommend increase metformin to 500 mg BID and d/c glimepiride. Will collaborate w/ Philis Nettle, NP on this. . Due for refill on carvedilol. Called pharmacy, asked to refill. They request a script for  a 90 day supply; will collaborate w/ Philis Nettle, NP on this . Reviewed outside meds. Patient up to date on fills for lisinopril, carvedilol, atorvastatin, potassium, clopidogrel, and metformin . Encouraged to pick up glucometer and start checking glucose daily. Pharmacy notes that they need her Medicare A/B card to run glucometer. Contacted patient to explain this.  . Extensive dietary discussion. Reviewed fruits that are higher in glucose. Discussed to focus on smaller portion sizes of carbohydrates. Praised for low-carb breakfast options and choice of whole wheat bread . Patient has f/u appointment w/ Dr. Terese Door in February; recommend updated lipid panel as well as other pertinent labs; increase atorvastatin if LDL not at goal  Patient Self Care Activities:  . Patient will check blood glucose daily, document, and provide at future appointments . Patient will take medications as prescribed . Patient will report any questions or concerns to provider   Please see past updates related to this goal by clicking on the "Past Updates" button in the selected goal         The patient verbalized understanding of instructions provided today and declined a print copy of patient instruction materials.   Plan:  - Will follow up with patient in ~4 weeks for continued medication management support  Catie [name], PharmD, [place] Pharmacist [place] [phone]

## 2019-10-10 NOTE — Progress Notes (Signed)
Med rx

## 2019-10-13 DIAGNOSIS — I639 Cerebral infarction, unspecified: Secondary | ICD-10-CM | POA: Diagnosis not present

## 2019-10-26 ENCOUNTER — Other Ambulatory Visit: Payer: Self-pay | Admitting: Internal Medicine

## 2019-10-26 DIAGNOSIS — E876 Hypokalemia: Secondary | ICD-10-CM

## 2019-10-26 MED ORDER — ATORVASTATIN CALCIUM 40 MG PO TABS: 40.0000 mg | ORAL_TABLET | Freq: Every day | ORAL | 1 refills | Status: DC

## 2019-10-26 MED ORDER — POTASSIUM CHLORIDE CRYS ER 20 MEQ PO TBCR: 20.0000 meq | EXTENDED_RELEASE_TABLET | Freq: Every day | ORAL | 2 refills | Status: DC

## 2019-10-26 MED ORDER — CLOPIDOGREL BISULFATE 75 MG PO TABS: 75.0000 mg | ORAL_TABLET | Freq: Every day | ORAL | 1 refills | Status: DC

## 2019-10-26 NOTE — Telephone Encounter (Signed)
Refills sent

## 2019-10-26 NOTE — Telephone Encounter (Signed)
atorvastatin (LIPITOR) 40 MG tablet clopidogrel (PLAVIX) 75 MG tablet  potassium chloride SA (KLOR-CON M20) 20 MEQ tablet    Pharmacy: [facility] [id number] - [place] (N),  - [place]

## 2019-11-07 ENCOUNTER — Other Ambulatory Visit: Payer: Self-pay | Admitting: Internal Medicine

## 2019-11-07 DIAGNOSIS — E119 Type 2 diabetes mellitus without complications: Secondary | ICD-10-CM

## 2019-11-07 MED ORDER — METFORMIN HCL ER 500 MG PO TB24: 1000.0000 mg | ORAL_TABLET | Freq: Every day | ORAL | 1 refills | Status: DC

## 2019-11-07 NOTE — Telephone Encounter (Signed)
Medication Refill - Medication: metFORMIN (GLUCOPHAGE XR) 500 MG 24 hr tablet  Has the patient contacted their pharmacy? Yes.   (Agent: If no, request that the patient contact the pharmacy for the refill.) (Agent: If yes, when and what did the pharmacy advise?)  Preferred Pharmacy (with phone number or street name):  [facility] [place] - [address] Phone:  [phone]  Fax:  [phone]     PATIENT ONLY HAS TWO PILLS LEFT  Agent: Please be advised that RX refills may take up to 3 business days. We ask that you follow-up with your pharmacy.

## 2019-11-07 NOTE — Telephone Encounter (Signed)
Requested medication (s) are due for refill today: yes  Requested medication (s) are on the active medication list: yes  Last refill:  10/10/2019  Future visit scheduled: no  Notes to clinic:  review for refill Lat filled by Philis Nettle   Requested Prescriptions  Pending Prescriptions Disp Refills   metFORMIN (GLUCOPHAGE XR) 500 MG 24 hr tablet 180 tablet 1    Sig: Take 2 tablets (1,000 mg total) by mouth daily with breakfast.      There is no refill protocol information for this order

## 2019-11-12 DIAGNOSIS — I639 Cerebral infarction, unspecified: Secondary | ICD-10-CM | POA: Diagnosis not present

## 2019-11-16 ENCOUNTER — Ambulatory Visit (INDEPENDENT_AMBULATORY_CARE_PROVIDER_SITE_OTHER): Payer: PPO

## 2019-11-16 ENCOUNTER — Other Ambulatory Visit: Payer: Self-pay

## 2019-11-16 ENCOUNTER — Encounter (INDEPENDENT_AMBULATORY_CARE_PROVIDER_SITE_OTHER): Payer: Self-pay | Admitting: Nurse Practitioner

## 2019-11-16 ENCOUNTER — Ambulatory Visit (INDEPENDENT_AMBULATORY_CARE_PROVIDER_SITE_OTHER): Payer: PPO | Admitting: Nurse Practitioner

## 2019-11-16 VITALS — BP 202/100 | HR 73 | Resp 17 | Ht 61.0 in | Wt 163.0 lb

## 2019-11-16 DIAGNOSIS — E1165 Type 2 diabetes mellitus with hyperglycemia: Secondary | ICD-10-CM

## 2019-11-16 DIAGNOSIS — I10 Essential (primary) hypertension: Secondary | ICD-10-CM | POA: Diagnosis not present

## 2019-11-16 DIAGNOSIS — I63239 Cerebral infarction due to unspecified occlusion or stenosis of unspecified carotid arteries: Secondary | ICD-10-CM

## 2019-11-21 ENCOUNTER — Encounter (INDEPENDENT_AMBULATORY_CARE_PROVIDER_SITE_OTHER): Payer: Self-pay | Admitting: Nurse Practitioner

## 2019-11-21 NOTE — Progress Notes (Signed)
SUBJECTIVE:  Patient ID: Tammy Nunez, female    DOB: 07/25/1951, 68 y.o.   MRN: [id number] Chief Complaint  Patient presents with  . Follow-up    ultrasound    HPI  Tammy Nunez is a 68 y.o. female The patient is seen for follow up evaluation of carotid stenosis status right carotid stent on 07/20/2019.  There were no post operative problems or complications related to the surgery.  The patient denies neck or incisional pain.  The patient denies interval amaurosis fugax. There is no recent history of TIA symptoms or focal motor deficits. There is no prior documented CVA.  The patient denies headache.  The patient is taking enteric-coated aspirin 81 mg daily.  The patient has a history of coronary artery disease, no recent episodes of angina or shortness of breath. The patient denies PAD or claudication symptoms. There is a history of hyperlipidemia which is being treated with a statin.   Carotid Duplex done today shows 1-39% RICA and 40-98% LICA.  No change compared to last study in 08/15/2019.  Past Medical History:  Diagnosis Date  . Chronic left shoulder pain   . Diabetes mellitus without complication ([place])   . Hypertension   . Paralysis ([place])    weakness left upper amd lower extremity  . Stroke [facility])     Past Surgical History:  Procedure Laterality Date  . CAROTID PTA/STENT INTERVENTION Left 07/20/2019   Procedure: CAROTID PTA/STENT INTERVENTION;  Surgeon: Katha Cabal, MD;  Location: [facility];  Service: Cardiovascular;  Laterality: Left;  . ECTOPIC PREGNANCY SURGERY      Social History   Socioeconomic History  . Marital status: Married    Spouse name: Not on file  . Number of children: Not on file  . Years of education: Not on file  . Highest education level: Not on file  Occupational History  . Not on file  Tobacco Use  . Smoking status: Former Smoker    Packs/day: 0.50    Years: 15.00    Pack years: 7.50    Types: Cigarettes   Quit date: 04/11/2019    Years since quitting: 0.6  . Smokeless tobacco: Never Used  Substance and Sexual Activity  . Alcohol use: Never  . Drug use: Never  . Sexual activity: Not Currently  Other Topics Concern  . Not on file  Social History Narrative  . Not on file   Social Determinants of Health   Financial Resource Strain: Medium Risk  . Difficulty of Paying Living Expenses: Somewhat hard  Food Insecurity: Food Insecurity Present  . Worried About Charity fundraiser in the Last Year: Never true  . Ran Out of Food in the Last Year: Sometimes true  Transportation Needs: Unmet Transportation Needs  . Lack of Transportation (Medical): Yes  . Lack of Transportation (Non-Medical): Yes  Physical Activity: Inactive  . Days of Exercise per Week: 0 days  . Minutes of Exercise per Session: 0 min  Stress: Stress Concern Present  . Feeling of Stress : Very much  Social Connections: Not Isolated  . Frequency of Communication with Friends and Family: Three times a week  . Frequency of Social Gatherings with Friends and Family: Once a week  . Attends Religious Services: 1 to 4 times per year  . Active Member of Clubs or Organizations: Yes  . Attends Archivist Meetings: 1 to 4 times per year  . Marital Status: Married  Human resources officer Violence: Not At Risk  .  Fear of Current or Ex-Partner: No  . Emotionally Abused: No  . Physically Abused: No  . Sexually Abused: No    Family History  Problem Relation Age of Onset  . Diabetes type II Mother   . Hypertension Father   . Diabetes type II Brother     No Known Allergies   Review of Systems   Review of Systems: Negative Unless Checked Constitutional: [] Weight loss  [] Fever  [] Chills Cardiac: [] Chest pain   []  Atrial Fibrillation  [] Palpitations   [] Shortness of breath when laying flat   [] Shortness of breath with exertion. [] Shortness of breath at rest Vascular:  [] Pain in legs with walking   [] Pain in legs with  standing [] Pain in legs when laying flat   [] Claudication    [] Pain in feet when laying flat    [] History of DVT   [] Phlebitis   [] Swelling in legs   [] Varicose veins   [] Non-healing ulcers Pulmonary:   [] Uses home oxygen   [] Productive cough   [] Hemoptysis   [] Wheeze  [] COPD   [] Asthma Neurologic:  [] Dizziness   [x] Seizures  [] Blackouts [x] History of stroke   [] History of TIA  [] Aphasia   [] Temporary Blindness   [x] Weakness or numbness in arm   [x] Weakness or numbness in leg Musculoskeletal:   [] Joint swelling   [] Joint pain   [] Low back pain  []  History of Knee Replacement [] Arthritis [] back Surgeries  []  Spinal Stenosis    Hematologic:  [] Easy bruising  [] Easy bleeding   [] Hypercoagulable state   [] Anemic Gastrointestinal:  [] Diarrhea   [] Vomiting  [] Gastroesophageal reflux/heartburn   [] Difficulty swallowing. [] Abdominal pain Genitourinary:  [] Chronic kidney disease   [] Difficult urination  [] Anuric   [] Blood in urine [] Frequent urination  [] Burning with urination   [] Hematuria Skin:  [] Rashes   [] Ulcers [] Wounds Psychological:  [] History of anxiety   []  History of major depression  []  Memory Difficulties      OBJECTIVE:   Physical Exam  BP (!) 202/100 (BP Location: Left Arm)   Pulse 73   Resp 17   Ht 5' 1"  (1.549 m)   Wt 163 lb (73.9 kg)   BMI 30.80 kg/m   Gen: WD/WN, NAD Head: [place]/AT, No temporalis wasting.  Ear/Nose/Throat: Hearing grossly intact, nares w/o erythema or drainage Eyes: PER, EOMI, sclera nonicteric.  Neck: Supple, no masses.  No JVD.  Pulmonary:  Good air movement, no use of accessory muscles.  Cardiac: RRR Vascular: no carotid  bruit  Vessel Right Left  Radial Palpable Palpable   Gastrointestinal: soft, non-distended. No guarding/no peritoneal signs.  Musculoskeletal: L sided weakness and using wheelchair  Neurologic: Pain and light touch intact in extremities.  Symmetrical.  Speech is fluent. Motor exam as listed above. Psychiatric: Judgment intact, Mood &  affect appropriate for pt's clinical situation. Dermatologic: No Venous rashes. No Ulcers Noted.  No changes consistent with cellulitis. Lymph : No Cervical lymphadenopathy, no lichenification or skin changes of chronic lymphedema.       ASSESSMENT AND PLAN:  1. Carotid stenosis, symptomatic, with infarction [facility]) Recommend:  The patient is s/p successful right carotid stent on 07/20/2019  Carotid Duplex done today shows 1-39% RICA and 47-82% LICA.  No change compared to last study in 08/15/2019.    Continue antiplatelet therapy as prescribed Continue management of CAD, HTN and Hyperlipidemia Healthy heart diet,  encouraged exercise at least 4 times per week  Follow up in 12 months.      2. Essential hypertension Patient BP extremely elevated  today.  Patient does have BP cuff at home. Denies any stroke like symptoms.  Advised to take once home.  If it continues to be high she should contact PCP and go to urgent care/ED for evaluation.    3. Type 2 diabetes mellitus with hyperglycemia, without long-term current use of insulin (HCC) Continue hypoglycemic medications as already ordered, these medications have been reviewed and there are no changes at this time.  Hgb A1C to be monitored as already arranged by primary service    Current Outpatient Medications on File Prior to Visit  Medication Sig Dispense Refill  . acetaminophen (TYLENOL) 325 MG tablet Take 1-2 tablets (325-650 mg total) by mouth every 4 (four) hours as needed for mild pain.    Marland Kitchen aspirin EC 81 MG EC tablet Take 1 tablet (81 mg total) by mouth daily.    Marland Kitchen atorvastatin (LIPITOR) 40 MG tablet Take 1 tablet (40 mg total) by mouth daily at 6 PM. 30 tablet 1  . blood glucose meter kit and supplies 1 each by Other route 4 (four) times daily. Dispense based on patient and insurance preference. Four times daily as directed. (FOR ICD-10 E10.9, E11.9). 1 each 0  . carvedilol (COREG) 6.25 MG tablet Take 1 tablet (6.25 mg  total) by mouth 2 (two) times daily with a meal. 180 tablet 3  . clopidogrel (PLAVIX) 75 MG tablet Take 1 tablet (75 mg total) by mouth daily. 30 tablet 1  . lisinopril (ZESTRIL) 20 MG tablet Take 1 tablet (20 mg total) by mouth daily. 90 tablet 3  . metFORMIN (GLUCOPHAGE XR) 500 MG 24 hr tablet Take 2 tablets (1,000 mg total) by mouth daily with breakfast. 180 tablet 1  . potassium chloride SA (KLOR-CON M20) 20 MEQ tablet Take 1 tablet (20 mEq total) by mouth daily. 30 tablet 2  . vitamin B-12 (CYANOCOBALAMIN) 1000 MCG tablet Take 1 tablet (1,000 mcg total) by mouth daily. 30 tablet 1   No current facility-administered medications on file prior to visit.    There are no Patient Instructions on file for this visit. No follow-ups on file.   Kris Hartmann, NP  This note was completed with Sales executive.  Any errors are purely unintentional.

## 2019-11-22 ENCOUNTER — Ambulatory Visit: Payer: PPO | Admitting: Internal Medicine

## 2019-11-30 ENCOUNTER — Encounter: Payer: Self-pay | Admitting: Internal Medicine

## 2019-11-30 ENCOUNTER — Ambulatory Visit (INDEPENDENT_AMBULATORY_CARE_PROVIDER_SITE_OTHER): Payer: PPO | Admitting: Internal Medicine

## 2019-11-30 ENCOUNTER — Other Ambulatory Visit: Payer: Self-pay

## 2019-11-30 VITALS — Ht 61.0 in | Wt 163.0 lb

## 2019-11-30 DIAGNOSIS — Z13818 Encounter for screening for other digestive system disorders: Secondary | ICD-10-CM

## 2019-11-30 DIAGNOSIS — I639 Cerebral infarction, unspecified: Secondary | ICD-10-CM

## 2019-11-30 DIAGNOSIS — R319 Hematuria, unspecified: Secondary | ICD-10-CM | POA: Diagnosis not present

## 2019-11-30 DIAGNOSIS — Z1329 Encounter for screening for other suspected endocrine disorder: Secondary | ICD-10-CM | POA: Diagnosis not present

## 2019-11-30 DIAGNOSIS — R531 Weakness: Secondary | ICD-10-CM | POA: Diagnosis not present

## 2019-11-30 DIAGNOSIS — Z8673 Personal history of transient ischemic attack (TIA), and cerebral infarction without residual deficits: Secondary | ICD-10-CM | POA: Diagnosis not present

## 2019-11-30 DIAGNOSIS — D649 Anemia, unspecified: Secondary | ICD-10-CM | POA: Diagnosis not present

## 2019-11-30 DIAGNOSIS — E1165 Type 2 diabetes mellitus with hyperglycemia: Secondary | ICD-10-CM

## 2019-11-30 DIAGNOSIS — I10 Essential (primary) hypertension: Secondary | ICD-10-CM

## 2019-11-30 DIAGNOSIS — E559 Vitamin D deficiency, unspecified: Secondary | ICD-10-CM

## 2019-11-30 MED ORDER — LISINOPRIL 40 MG PO TABS: 40.0000 mg | ORAL_TABLET | Freq: Every day | ORAL | 3 refills | Status: DC

## 2019-11-30 MED ORDER — AMLODIPINE BESYLATE 2.5 MG PO TABS: 2.5000 mg | ORAL_TABLET | Freq: Every day | ORAL | 3 refills | Status: DC

## 2019-11-30 MED ORDER — CARVEDILOL 12.5 MG PO TABS: 12.5000 mg | ORAL_TABLET | Freq: Two times a day (BID) | ORAL | 3 refills | Status: DC

## 2019-11-30 NOTE — Progress Notes (Signed)
Telephone Note  I connected with Tammy Nunez   on 11/30/19 at 11:30 AM EST by telephone and verified that I am speaking with the correct person using two identifiers.  Location patient: home Location provider:work or home office Persons participating in the virtual visit: patient, provider  I discussed the limitations of evaluation and management by telemedicine and the availability of in person appointments. The patient expressed understanding and agreed to proceed.   HPI: 1. 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 She ask about disability today disc [facility] exams to call [phone] , social services and neurology if interested in disability  She has done PT and OT and still does exercises at home uses a walker currently and wheelchair for longer distances   MRI 06/06/2019  IMPRESSION: Embolic infarctions in the right middle cerebral artery territory as above. Minimal petechial blood products in the right posterior temporal occipital parietal junction region without frank hematoma. No mass effect or shift.  Background pattern of mild chronic small-vessel ischemic change affecting the hemispheric white matter elsewhere.  2. Uncontrolled BP 202/100 on lis 20 mg and coreg 6.25 mg bid  3. DM 2 uncontrolled 05/2019 A1C 9.3 on metformin xr 500 mg qd and cbgs in the am 94-12 6    ROS: See pertinent positives and negatives per HPI.  Past Medical History:  Diagnosis Date  . Chronic left shoulder pain   . Diabetes mellitus without complication ([place])   . Hypertension   . Paralysis ([place])    weakness left upper amd lower extremity  . Stroke [facility])     Past Surgical History:  Procedure  Laterality Date  . CAROTID PTA/STENT INTERVENTION Left 07/20/2019   Procedure: CAROTID PTA/STENT INTERVENTION;  Surgeon: Katha Cabal, MD;  Location: [place] [facility];  Service: Cardiovascular;  Laterality: Left;  . ECTOPIC PREGNANCY SURGERY      Family History  Problem Relation Age of Onset  . Diabetes type II Mother   . Hypertension Father   . Diabetes type II Brother     SOCIAL HX:  Married lives home with husband no kids   Current Outpatient Medications:  .  acetaminophen (TYLENOL) 325 MG tablet, Take 1-2 tablets (325-650 mg total) by mouth every 4 (four) hours as needed for mild pain., Disp:  , Rfl:  .  aspirin EC 81 MG EC tablet, Take 1 tablet (81 mg total) by mouth daily., Disp:  , Rfl:  .  atorvastatin (LIPITOR) 40 MG tablet, Take 1 tablet (40 mg total) by mouth daily at 6 PM., Disp: 30 tablet, Rfl: 1 .  blood glucose meter kit and supplies, 1 each by Other route 4 (four) times daily. Dispense based on patient and insurance preference. Four times daily as directed. (FOR ICD-10 E10.9, E11.9)., Disp: 1 each, Rfl: 0 .  carvedilol (COREG) 12.5 MG tablet, Take 1 tablet (12.5 mg total) by mouth 2 (two) times daily with a meal., Disp: 180 tablet, Rfl: 3 .  clopidogrel (PLAVIX) 75 MG tablet, Take 1 tablet (75 mg total) by mouth daily., Disp: 30 tablet, Rfl: 1 .  lisinopril (ZESTRIL) 40 MG tablet, Take 1 tablet (40 mg total) by mouth daily., Disp:  90 tablet, Rfl: 3 .  metFORMIN (GLUCOPHAGE XR) 500 MG 24 hr tablet, Take 2 tablets (1,000 mg total) by mouth daily with breakfast., Disp: 180 tablet, Rfl: 1 .  potassium chloride SA (KLOR-CON M20) 20 MEQ tablet, Take 1 tablet (20 mEq total) by mouth daily., Disp: 30 tablet, Rfl: 2 .  vitamin B-12 (CYANOCOBALAMIN) 1000 MCG tablet, Take 1 tablet (1,000 mcg total) by mouth daily., Disp: 30 tablet, Rfl: 1 .  amLODipine (NORVASC) 2.5 MG tablet, Take 1 tablet (2.5 mg total) by mouth daily., Disp: 90 tablet, Rfl: 3  EXAM:  VITALS per  patient if applicable:  GENERAL: alert, oriented, appears well and in no acute distress  HEENT: atraumatic, conjunttiva clear, no obvious abnormalities on inspection of external nose and ears  NECK: normal movements of the head and neck  LUNGS: on inspection no signs of respiratory distress, breathing rate appears normal, no obvious gross SOB, gasping or wheezing  CV: no obvious cyanosis  MS: moves all visible extremities without noticeable abnormality  PSYCH/NEURO: pleasant and cooperative, no obvious depression or anxiety, speech and thought processing grossly intact  ASSESSMENT AND PLAN:  Discussed the following assessment and plan:  Essential hypertension - Plan: carvedilol (COREG) 12.5 MG tablet inc from 6.25 mg bid, lisinopril (ZESTRIL) 40 MG tablet inc from 20 mg qd, amLODipine (NORVASC) 2.5 MG tablet add to start 12/05/19  Ambulatory referral to Ophthalmology [facility] [place] Comprehensive metabolic panel, CBC with Differential/Platelet, Lipid panel  05/2019 CVA (cerebrovascular accident) [facility]) with left upper ext hemiparesis>left lower ext - Plan: carvedilol (COREG) 12.5 MG tablet bid, with lis 40 mg qd and norvasc 2.5 mg qd  Fu Dr. Melrose Nakayama sch 03/2020  Pt interested in disability not able to work see HPI Cont BP and diabetes  rec monitor BP and get cuff from [facility] automatic   Type 2 diabetes mellitus with hyperglycemia, without long-term current use of insulin ([place]) - Plan: Ambulatory referral to Ophthalmology, Comprehensive metabolic panel, CBC with Differential/Platelet, Lipid panel, HgB A1c, Urinalysis, Routine w reflex microscopic, Microalbumin / creatinine urine ratio Cont metformin for now  Referred eye MD  Do foot exam in future  On statin and ACEI tolerating   Anemia, unspecified type - Plan: CBC with Differential/Platelet, Iron, TIBC and Ferritin Panel  Hematuria, unspecified type - Plan: Urinalysis, Routine w reflex microscopic, Urine Culture  Vitamin  D deficiency - Plan: Vitamin D (25 hydroxy)  HM Flu shot declines 2019 had  Tdap disc in future for now declines all vaccines as of 11/30/19  shingrix  prevnar pna 23   Mammogram 03/2019 [facility] [place] normal per pt due 03/2020 Pap out of age window no h/o abnormal  Colonoscopy remotely ? In 1980s per pt Record consider in future  Consider bone density will do with next mammogram   -we discussed possible serious and likely etiologies, options for evaluation and workup, limitations of telemedicine visit vs in person visit, treatment, treatment risks and precautions. Pt prefers to treat via telemedicine empirically rather then risking or undertaking an in person visit at this moment. Patient agrees to seek prompt in person care if worsening, new symptoms arise, or if is not improving with treatment.   I discussed the assessment and treatment plan with the patient. The patient was provided an opportunity to ask questions and all were answered. The patient agreed with the plan and demonstrated an understanding of the instructions.   The patient was advised to call back or seek an in-person  evaluation if the symptoms worsen or if the condition fails to improve as anticipated.  Time spent 30-39 min [name], MD

## 2019-12-01 ENCOUNTER — Other Ambulatory Visit: Payer: PPO

## 2019-12-01 NOTE — Progress Notes (Signed)
Notes Faxed over 12/01/2019

## 2019-12-08 ENCOUNTER — Telehealth: Payer: Self-pay | Admitting: Internal Medicine

## 2019-12-08 ENCOUNTER — Other Ambulatory Visit: Payer: Self-pay

## 2019-12-08 ENCOUNTER — Other Ambulatory Visit (INDEPENDENT_AMBULATORY_CARE_PROVIDER_SITE_OTHER): Payer: PPO

## 2019-12-08 DIAGNOSIS — I10 Essential (primary) hypertension: Secondary | ICD-10-CM | POA: Diagnosis not present

## 2019-12-08 DIAGNOSIS — Z13818 Encounter for screening for other digestive system disorders: Secondary | ICD-10-CM

## 2019-12-08 DIAGNOSIS — R319 Hematuria, unspecified: Secondary | ICD-10-CM

## 2019-12-08 DIAGNOSIS — E559 Vitamin D deficiency, unspecified: Secondary | ICD-10-CM

## 2019-12-08 DIAGNOSIS — Z1329 Encounter for screening for other suspected endocrine disorder: Secondary | ICD-10-CM

## 2019-12-08 DIAGNOSIS — E1165 Type 2 diabetes mellitus with hyperglycemia: Secondary | ICD-10-CM | POA: Diagnosis not present

## 2019-12-08 DIAGNOSIS — D649 Anemia, unspecified: Secondary | ICD-10-CM

## 2019-12-08 LAB — COMPREHENSIVE METABOLIC PANEL
ALT: 17 U/L (ref 0–35)
AST: 15 U/L (ref 0–37)
Albumin: 4.4 g/dL (ref 3.5–5.2)
Alkaline Phosphatase: 53 U/L (ref 39–117)
BUN: 17 mg/dL (ref 6–23)
CO2: 23 mEq/L (ref 19–32)
Calcium: 9.8 mg/dL (ref 8.4–10.5)
Chloride: 109 mEq/L (ref 96–112)
Creatinine, Ser: 0.91 mg/dL (ref 0.40–1.20)
GFR: 74.2 mL/min (ref >60.00)
Glucose, Bld: 209 mg/dL — ABNORMAL HIGH (ref 70–99)
Potassium: 3.8 mEq/L (ref 3.5–5.1)
Sodium: 141 mEq/L (ref 135–145)
Total Bilirubin: 0.2 mg/dL (ref 0.2–1.2)
Total Protein: 6.9 g/dL (ref 6.0–8.3)

## 2019-12-08 LAB — LIPID PANEL
Cholesterol: 134 mg/dL (ref 0–200)
HDL: 42.8 mg/dL (ref >39.00)
LDL Cholesterol: 71 mg/dL (ref 0–99)
NonHDL: 91.61
Total CHOL/HDL Ratio: 3
Triglycerides: 101 mg/dL (ref 0.0–149.0)
VLDL: 20.2 mg/dL (ref 0.0–40.0)

## 2019-12-08 LAB — CBC WITH DIFFERENTIAL/PLATELET
Basophils Absolute: 0 10*3/uL (ref 0.0–0.1)
Basophils Relative: 0.6 % (ref 0.0–3.0)
Eosinophils Absolute: 0.1 10*3/uL (ref 0.0–0.7)
Eosinophils Relative: 1.2 % (ref 0.0–5.0)
HCT: 40.3 % (ref 36.0–46.0)
Hemoglobin: 13.5 g/dL (ref 12.0–15.0)
Lymphocytes Relative: 40 % (ref 12.0–46.0)
Lymphs Abs: 2.8 10*3/uL (ref 0.7–4.0)
MCHC: 33.6 g/dL (ref 30.0–36.0)
MCV: 88.8 fl (ref 78.0–100.0)
Monocytes Absolute: 0.6 10*3/uL (ref 0.1–1.0)
Monocytes Relative: 8.6 % (ref 3.0–12.0)
Neutro Abs: 3.5 10*3/uL (ref 1.4–7.7)
Neutrophils Relative %: 49.6 % (ref 43.0–77.0)
Platelets: 164 10*3/uL (ref 150.0–400.0)
RBC: 4.54 Mil/uL (ref 3.87–5.11)
RDW: 15.7 % — ABNORMAL HIGH (ref 11.5–15.5)
WBC: 7 10*3/uL (ref 4.0–10.5)

## 2019-12-08 LAB — HEMOGLOBIN A1C: Hgb A1c MFr Bld: 8 % — ABNORMAL HIGH (ref 4.6–6.5)

## 2019-12-08 LAB — TSH: TSH: 3.52 u[IU]/mL (ref 0.35–4.50)

## 2019-12-08 LAB — VITAMIN D 25 HYDROXY (VIT D DEFICIENCY, FRACTURES): VITD: 21.69 ng/mL — ABNORMAL LOW (ref 30.00–100.00)

## 2019-12-08 NOTE — Chronic Care Management (AMB) (Signed)
°  Care Management   Note  12/08/2019 Name: Tammy Nunez MRN: [id number] DOB: Jun 19, 1951  [name] is a 69 y.o. year old female who is a primary care patient of McLean-Scocuzza, Nino Glow, MD and is actively engaged with the care management team. I reached out to [facility] by phone today to assist with scheduling a follow up appointment with the Pharmacist  Follow up plan: The care management team will reach out to the patient again over the next 7 days.   [place], [place] [address] Direct Dial: [email or web address]  Website: [email or web address]

## 2019-12-09 LAB — URINALYSIS, ROUTINE W REFLEX MICROSCOPIC
Bilirubin Urine: NEGATIVE
Hgb urine dipstick: NEGATIVE
Ketones, ur: NEGATIVE
Nitrite: NEGATIVE
Protein, ur: NEGATIVE
Specific Gravity, Urine: 1.023 (ref 1.001–1.03)
pH: 5.5 (ref 5.0–8.0)

## 2019-12-09 LAB — HEPATITIS C ANTIBODY
Hepatitis C Ab: NONREACTIVE
SIGNAL TO CUT-OFF: 0.03 (ref <1.00)

## 2019-12-09 LAB — IRON,TIBC AND FERRITIN PANEL
%SAT: 36 % (calc) (ref 16–45)
Ferritin: 67 ng/mL (ref 16–288)
Iron: 113 ug/dL (ref 45–160)
TIBC: 317 mcg/dL (calc) (ref 250–450)

## 2019-12-09 LAB — URINE CULTURE
MICRO NUMBER:: 10042357
SPECIMEN QUALITY:: ADEQUATE

## 2019-12-09 LAB — MICROALBUMIN / CREATININE URINE RATIO
Creatinine, Urine: 105 mg/dL (ref 20–275)
Microalb Creat Ratio: 10 mcg/mg creat (ref <30)
Microalb, Ur: 1 mg/dL

## 2019-12-11 ENCOUNTER — Encounter: Payer: Self-pay | Admitting: Internal Medicine

## 2019-12-11 DIAGNOSIS — E559 Vitamin D deficiency, unspecified: Secondary | ICD-10-CM | POA: Insufficient documentation

## 2019-12-12 ENCOUNTER — Telehealth: Payer: Self-pay | Admitting: Lab

## 2019-12-12 NOTE — Telephone Encounter (Signed)
Called Pt No answer left VM to call office.

## 2019-12-13 DIAGNOSIS — I639 Cerebral infarction, unspecified: Secondary | ICD-10-CM | POA: Diagnosis not present

## 2019-12-16 NOTE — Chronic Care Management (AMB) (Signed)
°  Care Management   Note  12/16/2019 Name: Tammy Nunez MRN: [id number] DOB: 08-25-1951  Tammy Nunez is a 69 y.o. year old female who is a primary care patient of McLean-Scocuzza, Nino Glow, MD and is actively engaged with the care management team. I reached out to [facility] by phone today to assist with re-scheduling a follow up visit with the Pharmacist  Follow up plan: Unsuccessful telephone outreach attempt made. A HIPPA compliant phone message was left for the patient providing contact information and requesting a return call.  The care management team will reach out to the patient again over the next 7 days.  If patient returns call to provider office, please advise to call [place] at [place], [place]  [place], [place] [address] Direct Dial: [email or web address]  Website: [email or web address]

## 2019-12-20 NOTE — Chronic Care Management (AMB) (Signed)
  Care Management   Note  12/20/2019 Name: Tammy Nunez MRN: [id number] DOB: 1951/05/21  Tammy Nunez is a 69 y.o. year old female who is a primary care patient of McLean-Scocuzza, Nino Glow, MD and is actively engaged with the care management team. I reached out to [facility] by phone today to assist with re-scheduling a follow up visit with the Pharmacist  Follow up plan: Telephone appointment with care management team member scheduled for:01/12/2020  [place], [place] [facility]  [place], [place] [address] Direct Dial: [email or web address]  Website: [email or web address]

## 2019-12-21 DIAGNOSIS — E119 Type 2 diabetes mellitus without complications: Secondary | ICD-10-CM | POA: Diagnosis not present

## 2019-12-21 DIAGNOSIS — H35033 Hypertensive retinopathy, bilateral: Secondary | ICD-10-CM | POA: Diagnosis not present

## 2019-12-21 DIAGNOSIS — H2513 Age-related nuclear cataract, bilateral: Secondary | ICD-10-CM | POA: Diagnosis not present

## 2019-12-21 LAB — HM DIABETES EYE EXAM

## 2019-12-26 ENCOUNTER — Other Ambulatory Visit: Payer: Self-pay | Admitting: Internal Medicine

## 2019-12-26 MED ORDER — ATORVASTATIN CALCIUM 40 MG PO TABS: 40.0000 mg | ORAL_TABLET | Freq: Every day | ORAL | 1 refills | Status: DC

## 2019-12-26 MED ORDER — CLOPIDOGREL BISULFATE 75 MG PO TABS: 75.0000 mg | ORAL_TABLET | Freq: Every day | ORAL | 1 refills | Status: DC

## 2019-12-26 NOTE — Telephone Encounter (Signed)
Pt needs refills on atorvastatin (LIPITOR) 40 MG tablet and clopidogrel (PLAVIX) 75 MG tablet sent to [facility] on [address]- [facility]

## 2019-12-26 NOTE — Telephone Encounter (Signed)
Refills sent as requested

## 2019-12-28 ENCOUNTER — Encounter: Payer: Self-pay | Admitting: Internal Medicine

## 2019-12-28 ENCOUNTER — Other Ambulatory Visit: Payer: Self-pay

## 2019-12-28 ENCOUNTER — Ambulatory Visit (INDEPENDENT_AMBULATORY_CARE_PROVIDER_SITE_OTHER): Payer: PPO | Admitting: Internal Medicine

## 2019-12-28 ENCOUNTER — Ambulatory Visit: Payer: PPO | Admitting: Internal Medicine

## 2019-12-28 VITALS — BP 164/77 | Ht 61.0 in | Wt 153.0 lb

## 2019-12-28 DIAGNOSIS — E559 Vitamin D deficiency, unspecified: Secondary | ICD-10-CM | POA: Diagnosis not present

## 2019-12-28 DIAGNOSIS — I10 Essential (primary) hypertension: Secondary | ICD-10-CM | POA: Diagnosis not present

## 2019-12-28 DIAGNOSIS — Z1231 Encounter for screening mammogram for malignant neoplasm of breast: Secondary | ICD-10-CM

## 2019-12-28 DIAGNOSIS — E2839 Other primary ovarian failure: Secondary | ICD-10-CM

## 2019-12-28 DIAGNOSIS — H269 Unspecified cataract: Secondary | ICD-10-CM

## 2019-12-28 DIAGNOSIS — E119 Type 2 diabetes mellitus without complications: Secondary | ICD-10-CM

## 2019-12-28 MED ORDER — AMLODIPINE BESYLATE 5 MG PO TABS: 5.0000 mg | ORAL_TABLET | Freq: Every day | ORAL | 3 refills | Status: DC

## 2019-12-28 NOTE — Patient Instructions (Addendum)
COVID-19 Vaccine Information can be found at: ShippingScam.co.uk For questions related to vaccine distribution or appointments, please email [email or web address] or call [phone].     Budget-Friendly Healthy Eating There are many ways to save money at the grocery store and continue to eat healthy. You can be successful if you:  Plan meals according to your budget.  Make a grocery list and only purchase food according to your grocery list.  Prepare food yourself. What are tips for following this plan?  Reading food labels  Compare food labels between brand name foods and the store brand. Often the nutritional value is the same, but the store brand is lower cost.  Look for products that do not have added sugar, fat, or salt (sodium). These often cost the same but are healthier for you. Products may be labeled as: ? Sugar-free. ? Nonfat. ? Low-fat. ? Sodium-free. ? Low-sodium.  Look for lean ground beef labeled as at least 92% lean and 8% fat. Shopping  Buy only the items on your grocery list and go only to the areas of the store that have the items on your list.  Use coupons only for foods and brands you normally buy. Avoid buying items you wouldn't normally buy simply because they are on sale.  Check online and in newspapers for weekly deals.  Buy healthy items from the bulk bins when available, such as herbs, spices, flour, pasta, nuts, and dried fruit.  Buy fruits and vegetables that are in season. Prices are usually lower on in-season produce.  Look at the unit price on the price tag. Use it to compare different brands and sizes to find out which item is the best deal.  Choose healthy items that are often low-cost, such as carrots, potatoes, apples, bananas, and oranges. Dried or canned beans are a low-cost protein source.  Buy in bulk and freeze extra food. Items you can buy in bulk include meats, fish,  poultry, frozen fruits, and frozen vegetables.  Avoid buying "ready-to-eat" foods, such as pre-cut fruits and vegetables and pre-made salads.  If possible, shop around to discover where you can find the best prices. Consider other retailers such as dollar stores, larger [employer], local fruit and vegetable stands, and farmers markets.  Do not shop when you are hungry. If you shop while hungry, it may be hard to stick to your list and budget.  Resist impulse buying. Use your grocery list as your official plan for the week.  Buy a variety of vegetables and fruits by purchasing fresh, frozen, and canned items.  Look at the top and bottom shelves for deals. Foods at eye level (eye level of an adult or child) are usually more expensive.  Be efficient with your time when shopping. The more time you spend at the store, the more money you are likely to spend.  To save money when choosing more expensive foods like meats and dairy: ? Choose cheaper cuts of meat, such as bone-in chicken thighs and drumsticks instead of skinless and boneless chicken. When you are ready to prepare the chicken, you can remove the skin yourself to make it healthier. ? Choose lean meats like chicken or [place] instead of beef. ? Choose canned seafood, such as tuna, salmon, or sardines. ? Buy eggs as a low-cost source of protein. ? Buy dried beans and peas, such as lentils, split peas, or kidney beans instead of meats. Dried beans and peas are a good alternative source of protein. ? Buy the larger tubs  of yogurt instead of individual-sized containers.  Choose water instead of sodas and other sweetened beverages.  Avoid buying chips, cookies, and other "junk food." These items are usually expensive and not healthy. Cooking  Make extra food and freeze the extras in meal-sized containers or in individual portions for fast meals and snacks.  Pre-cook on days when you have extra time to prepare meals in advance. You  can keep these meals in the fridge or freezer and reheat for a quick meal.  When you come home from the grocery store, wash, peel, and cut fruits and vegetables so they are ready to use and eat. This will help reduce food waste. Meal planning  Do not eat out or get fast food. Prepare food at home.  Make a grocery list and make sure to bring it with you to the store. If you have a smart phone, you could use your phone to create your shopping list.  Plan meals and snacks according to a grocery list and budget you create.  Use leftovers in your meal plan for the week.  Look for recipes where you can cook once and make enough food for two meals.  Include budget-friendly meals like stews, casseroles, and stir-fry dishes.  Try some meatless meals or try "no cook" meals like salads.  Make sure that half your plate is filled with fruits or vegetables. Choose from fresh, frozen, or canned fruits and vegetables. If eating canned, remember to rinse them before eating. This will remove any excess salt added for packaging. Summary  Eating healthy on a budget is possible if you plan your meals according to your budget, purchase according to your budget and grocery list, and prepare food yourself.  Tips for buying more food on a limited budget include buying generic brands, using coupons only for foods you normally buy, and buying healthy items from the bulk bins when available.  Tips for buying cheaper food to replace expensive food include choosing cheaper, lean cuts of meat, and buying dried beans and peas. This information is not intended to replace advice given to you by your health care provider. Make sure you discuss any questions you have with your health care provider. Document Revised: 11/11/2017 Document Reviewed: 11/11/2017 Elsevier Patient Education  2020 [place] Following a healthy eating pattern may help you to achieve and maintain a healthy body weight, reduce  the risk of chronic disease, and live a long and productive life. It is important to follow a healthy eating pattern at an appropriate calorie level for your body. Your nutritional needs should be met primarily through food by choosing a variety of nutrient-rich foods. What are tips for following this plan? Reading food labels  Read labels and choose the following: ? Reduced or low sodium. ? Juices with 100% fruit juice. ? Foods with low saturated fats and high polyunsaturated and monounsaturated fats. ? Foods with whole grains, such as whole wheat, cracked wheat, brown rice, and wild rice. ? Whole grains that are fortified with folic acid. This is recommended for women who are pregnant or who want to become pregnant.  Read labels and avoid the following: ? Foods with a lot of added sugars. These include foods that contain brown sugar, corn sweetener, corn syrup, dextrose, fructose, glucose, high-fructose corn syrup, honey, invert sugar, lactose, malt syrup, maltose, molasses, raw sugar, sucrose, trehalose, or turbinado sugar.  Do not eat more than the following amounts of added sugar per day:  6 teaspoons (  25 g) for women.  9 teaspoons (38 g) for men. ? Foods that contain processed or refined starches and grains. ? Refined grain products, such as white flour, degermed cornmeal, white bread, and white rice. Shopping  Choose nutrient-rich snacks, such as vegetables, whole fruits, and nuts. Avoid high-calorie and high-sugar snacks, such as potato chips, fruit snacks, and candy.  Use oil-based dressings and spreads on foods instead of solid fats such as butter, stick margarine, or cream cheese.  Limit pre-made sauces, mixes, and "instant" products such as flavored rice, instant noodles, and ready-made pasta.  Try more plant-protein sources, such as tofu, tempeh, black beans, edamame, lentils, nuts, and seeds.  Explore eating plans such as the Mediterranean diet or vegetarian  diet. Cooking  Use oil to saut or stir-fry foods instead of solid fats such as butter, stick margarine, or lard.  Try baking, boiling, grilling, or broiling instead of frying.  Remove the fatty part of meats before cooking.  Steam vegetables in water or broth. Meal planning   At meals, imagine dividing your plate into fourths: ? One-half of your plate is fruits and vegetables. ? One-fourth of your plate is whole grains. ? One-fourth of your plate is protein, especially lean meats, poultry, eggs, tofu, beans, or nuts.  Include low-fat dairy as part of your daily diet. Lifestyle  Choose healthy options in all settings, including home, work, school, restaurants, or stores.  Prepare your food safely: ? Wash your hands after handling raw meats. ? Keep food preparation surfaces clean by regularly washing with hot, soapy water. ? Keep raw meats separate from ready-to-eat foods, such as fruits and vegetables. ? Cook seafood, meat, poultry, and eggs to the recommended internal temperature. ? Store foods at safe temperatures. In general:  Keep cold foods at 37F (4.4C) or below.  Keep hot foods at 137F (60C) or above.  Keep your freezer at [facility] (-17.8C) or below.  Foods are no longer safe to eat when they have been between the temperatures of 40-137F (4.4-60C) for more than 2 hours. What foods should I eat? Fruits Aim to eat 2 cup-equivalents of fresh, canned (in natural juice), or frozen fruits each day. Examples of 1 cup-equivalent of fruit include 1 small apple, 8 large strawberries, 1 cup canned fruit,  cup dried fruit, or 1 cup 100% juice. Vegetables Aim to eat 2-3 cup-equivalents of fresh and frozen vegetables each day, including different varieties and colors. Examples of 1 cup-equivalent of vegetables include 2 medium carrots, 2 cups raw, leafy greens, 1 cup chopped vegetable (raw or cooked), or 1 medium baked potato. Grains Aim to eat 6 ounce-equivalents of whole  grains each day. Examples of 1 ounce-equivalent of grains include 1 slice of bread, 1 cup ready-to-eat cereal, 3 cups popcorn, or  cup cooked rice, pasta, or cereal. Meats and other proteins Aim to eat 5-6 ounce-equivalents of protein each day. Examples of 1 ounce-equivalent of protein include 1 egg, 1/2 cup nuts or seeds, or 1 tablespoon (16 g) peanut butter. A cut of meat or fish that is the size of a deck of cards is about 3-4 ounce-equivalents.  Of the protein you eat each week, try to have at least 8 ounces come from seafood. This includes salmon, trout, herring, and anchovies. Dairy Aim to eat 3 cup-equivalents of fat-free or low-fat dairy each day. Examples of 1 cup-equivalent of dairy include 1 cup (240 mL) milk, 8 ounces (250 g) yogurt, 1 ounces (44 g) natural cheese, or 1 cup (  240 mL) fortified soy milk. Fats and oils  Aim for about 5 teaspoons (21 g) per day. Choose monounsaturated fats, such as canola and olive oils, avocados, peanut butter, and most nuts, or polyunsaturated fats, such as sunflower, corn, and soybean oils, walnuts, pine nuts, sesame seeds, sunflower seeds, and flaxseed. Beverages  Aim for six 8-oz glasses of water per day. Limit coffee to three to five 8-oz cups per day.  Limit caffeinated beverages that have added calories, such as soda and energy drinks.  Limit alcohol intake to no more than 1 drink a day for nonpregnant women and 2 drinks a day for men. One drink equals 12 oz of beer (355 mL), 5 oz of wine (148 mL), or 1 oz of hard liquor (44 mL). Seasoning and other foods  Avoid adding excess amounts of salt to your foods. Try flavoring foods with herbs and spices instead of salt.  Avoid adding sugar to foods.  Try using oil-based dressings, sauces, and spreads instead of solid fats. This information is based on general [place] nutrition guidelines. For more information, visit BuildDNA.es. Exact amounts may vary based on your nutrition  needs. Summary  A healthy eating plan may help you to maintain a healthy weight, reduce the risk of chronic diseases, and stay active throughout your life.  Plan your meals. Make sure you eat the right portions of a variety of nutrient-rich foods.  Try baking, boiling, grilling, or broiling instead of frying.  Choose healthy options in all settings, including home, work, school, restaurants, or stores. This information is not intended to replace advice given to you by your health care provider. Make sure you discuss any questions you have with your health care provider. Document Revised: 02/22/2018 Document Reviewed: 02/22/2018 Elsevier Patient Education  [place].  Diabetes Mellitus and Nutrition, Adult When you have diabetes (diabetes mellitus), it is very important to have healthy eating habits because your blood sugar (glucose) levels are greatly affected by what you eat and drink. Eating healthy foods in the appropriate amounts, at about the same times every day, can help you:  Control your blood glucose.  Lower your risk of heart disease.  Improve your blood pressure.  Reach or maintain a healthy weight. Every person with diabetes is different, and each person has different needs for a meal plan. Your health care provider may recommend that you work with a diet and nutrition specialist (dietitian) to make a meal plan that is best for you. Your meal plan may vary depending on factors such as:  The calories you need.  The medicines you take.  Your weight.  Your blood glucose, blood pressure, and cholesterol levels.  Your activity level.  Other health conditions you have, such as heart or kidney disease. How do carbohydrates affect me? Carbohydrates, also called carbs, affect your blood glucose level more than any other type of food. Eating carbs naturally raises the amount of glucose in your blood. Carb counting is a method for keeping track of how many carbs you  eat. Counting carbs is important to keep your blood glucose at a healthy level, especially if you use insulin or take certain oral diabetes medicines. It is important to know how many carbs you can safely have in each meal. This is different for every person. Your dietitian can help you calculate how many carbs you should have at each meal and for each snack. Foods that contain carbs include:  Bread, cereal, rice, pasta, and crackers.  Potatoes and  corn.  Peas, beans, and lentils.  Milk and yogurt.  Fruit and juice.  Desserts, such as cakes, cookies, ice cream, and candy. How does alcohol affect me? Alcohol can cause a sudden decrease in blood glucose (hypoglycemia), especially if you use insulin or take certain oral diabetes medicines. Hypoglycemia can be a life-threatening condition. Symptoms of hypoglycemia (sleepiness, dizziness, and confusion) are similar to symptoms of having too much alcohol. If your health care provider says that alcohol is safe for you, follow these guidelines:  Limit alcohol intake to no more than 1 drink per day for nonpregnant women and 2 drinks per day for men. One drink equals 12 oz of beer, 5 oz of wine, or 1 oz of hard liquor.  Do not drink on an empty stomach.  Keep yourself hydrated with water, diet soda, or unsweetened iced tea.  Keep in mind that regular soda, juice, and other mixers may contain a lot of sugar and must be counted as carbs. What are tips for following this plan?  Reading food labels  Start by checking the serving size on the "Nutrition Facts" label of packaged foods and drinks. The amount of calories, carbs, fats, and other nutrients listed on the label is based on one serving of the item. Many items contain more than one serving per package.  Check the total grams (g) of carbs in one serving. You can calculate the number of servings of carbs in one serving by dividing the total carbs by 15. For example, if a food has 30 g of total  carbs, it would be equal to 2 servings of carbs.  Check the number of grams (g) of saturated and trans fats in one serving. Choose foods that have low or no amount of these fats.  Check the number of milligrams (mg) of salt (sodium) in one serving. Most people should limit total sodium intake to less than 2,300 mg per day.  Always check the nutrition information of foods labeled as "low-fat" or "nonfat". These foods may be higher in added sugar or refined carbs and should be avoided.  Talk to your dietitian to identify your daily goals for nutrients listed on the label. Shopping  Avoid buying canned, premade, or processed foods. These foods tend to be high in fat, sodium, and added sugar.  Shop around the outside edge of the grocery store. This includes fresh fruits and vegetables, bulk grains, fresh meats, and fresh dairy. Cooking  Use low-heat cooking methods, such as baking, instead of high-heat cooking methods like deep frying.  Cook using healthy oils, such as olive, canola, or sunflower oil.  Avoid cooking with butter, cream, or high-fat meats. Meal planning  Eat meals and snacks regularly, preferably at the same times every day. Avoid going long periods of time without eating.  Eat foods high in fiber, such as fresh fruits, vegetables, beans, and whole grains. Talk to your dietitian about how many servings of carbs you can eat at each meal.  Eat 4-6 ounces (oz) of lean protein each day, such as lean meat, chicken, fish, eggs, or tofu. One oz of lean protein is equal to: ? 1 oz of meat, chicken, or fish. ? 1 egg. ?  cup of tofu.  Eat some foods each day that contain healthy fats, such as avocado, nuts, seeds, and fish. Lifestyle  Check your blood glucose regularly.  Exercise regularly as told by your health care provider. This may include: ? 150 minutes of moderate-intensity or vigorous-intensity exercise each  week. This could be brisk walking, biking, or water  aerobics. ? Stretching and doing strength exercises, such as yoga or weightlifting, at least 2 times a week.  Take medicines as told by your health care provider.  Do not use any products that contain nicotine or tobacco, such as cigarettes and e-cigarettes. If you need help quitting, ask your health care provider.  Work with a Social worker or diabetes educator to identify strategies to manage stress and any emotional and social challenges. Questions to ask a health care provider  Do I need to meet with a diabetes educator?  Do I need to meet with a dietitian?  What number can I call if I have questions?  When are the best times to check my blood glucose? Where to find more information:  American Diabetes Association: diabetes.org  Academy of Nutrition and Dietetics: www.eatright.[employer] of Diabetes and Digestive and Kidney Diseases (NIH): [employer] Summary  A healthy meal plan will help you control your blood glucose and maintain a healthy lifestyle.  Working with a diet and nutrition specialist (dietitian) can help you make a meal plan that is best for you.  Keep in mind that carbohydrates (carbs) and alcohol have immediate effects on your blood glucose levels. It is important to count carbs and to use alcohol carefully. This information is not intended to replace advice given to you by your health care provider. Make sure you discuss any questions you have with your health care provider. Document Revised: 10/23/2017 Document Reviewed: 12/15/2016 Elsevier Patient Education  2020 [employer].

## 2019-12-28 NOTE — Progress Notes (Signed)
Telephone Note  I connected with Tammy Nunez   on 12/28/19 at 10:20 AM EST by telephone and verified that I am speaking with the correct person using two identifiers.  Location patient: home Location provider:work or home office Persons participating in the virtual visit: patient, provider  I discussed the limitations of evaluation and management by telemedicine and the availability of in person appointments. The patient expressed understanding and agreed to proceed.   HPI: 1. HTN on norvasc 2.5, coreg 12.5 mg bid, lis 40 mg qd BP still elevated but trending down she has a wrist cuff  2. DM 2 with elevated cbs 8.0 on metformin xr 1000 mg qd [facility] 11/2019 and no diabetes in eyes but cataracts and otherwise vision ok    3. S/p stroke f/u Dr. Melrose Nakayama 03/2020   ROS: See pertinent positives and negatives per HPI.  Past Medical History:  Diagnosis Date  . Chronic left shoulder pain   . Diabetes mellitus without complication ([place])   . Hypertension   . Paralysis ([place])    weakness left upper amd lower extremity  . Stroke [facility])     Past Surgical History:  Procedure Laterality Date  . CAROTID PTA/STENT INTERVENTION Left 07/20/2019   Procedure: CAROTID PTA/STENT INTERVENTION;  Surgeon: Katha Cabal, MD;  Location: [place];  Service: Cardiovascular;  Laterality: Left;  . ECTOPIC PREGNANCY SURGERY      Family History  Problem Relation Age of Onset  . Diabetes type II Mother   . Hypertension Father   . Diabetes type II Brother     SOCIAL HX:  No kids  Married with husband  Used to work Personal assistant   Current Outpatient Medications:  .  acetaminophen (TYLENOL) 325 MG tablet, Take 1-2 tablets (325-650 mg total) by mouth every 4 (four) hours as needed for mild pain., Disp:  , Rfl:  .  amLODipine (NORVASC) 5 MG tablet, Take 1 tablet (5 mg total) by mouth daily., Disp: 90 tablet, Rfl: 3 .  aspirin EC 81 MG EC tablet, Take 1 tablet  (81 mg total) by mouth daily., Disp:  , Rfl:  .  atorvastatin (LIPITOR) 40 MG tablet, Take 1 tablet (40 mg total) by mouth daily at 6 PM., Disp: 30 tablet, Rfl: 1 .  blood glucose meter kit and supplies, 1 each by Other route 4 (four) times daily. Dispense based on patient and insurance preference. Four times daily as directed. (FOR ICD-10 E10.9, E11.9)., Disp: 1 each, Rfl: 0 .  carvedilol (COREG) 12.5 MG tablet, Take 1 tablet (12.5 mg total) by mouth 2 (two) times daily with a meal., Disp: 180 tablet, Rfl: 3 .  clopidogrel (PLAVIX) 75 MG tablet, Take 1 tablet (75 mg total) by mouth daily., Disp: 30 tablet, Rfl: 1 .  lisinopril (ZESTRIL) 40 MG tablet, Take 1 tablet (40 mg total) by mouth daily., Disp: 90 tablet, Rfl: 3 .  metFORMIN (GLUCOPHAGE XR) 500 MG 24 hr tablet, Take 2 tablets (1,000 mg total) by mouth daily with breakfast., Disp: 180 tablet, Rfl: 1 .  potassium chloride SA (KLOR-CON M20) 20 MEQ tablet, Take 1 tablet (20 mEq total) by mouth daily., Disp: 30 tablet, Rfl: 2 .  vitamin B-12 (CYANOCOBALAMIN) 1000 MCG tablet, Take 1 tablet (1,000 mcg total) by mouth daily., Disp: 30 tablet, Rfl: 1  EXAM:  VITALS per patient if applicable:  GENERAL: alert, oriented, appears well and in no acute distress  HEENT: atraumatic, conjunttiva clear, no  obvious abnormalities on inspection of external nose and ears  NECK: normal movements of the head and neck  LUNGS: on inspection no signs of respiratory distress, breathing rate appears normal, no obvious gross SOB, gasping or wheezing  CV: no obvious cyanosis  MS: moves all visible extremities without noticeable abnormality  PSYCH/NEURO: pleasant and cooperative, no obvious depression or anxiety, speech and thought processing grossly intact  ASSESSMENT AND PLAN:  Discussed the following assessment and plan:  Essential hypertension - Plan: amLODipine (NORVASC) 5 MG tablet increase from 2.5 mg qd cont other meds monitor BP goal <130/<80 F/u 4-6  weeks   Type 2 diabetes mellitus without complication, without long-term current use of insulin (HCC) A1C 8.0  On metformin  Eye exam [facility] 11/2019 sent ROI  Will do foot exam at f/u  Cataract of both eyes, unspecified cataract type no intervention for now   HM Flu shot declines 2019 had  Tdap disc in future for now declines all vaccines as of 11/30/19  shingrix  prevnar pna 23  covid vx interested given contact #s today   Mammogram 03/2019 [facility] [place] normal per pt due 03/2020, ordered [place] &DEXA Pap out of age window no h/o abnormal  Colonoscopy remotely ? In 1980s per pt Record consider in future vs cologuard  Consider bone density will do with next mammogram, ordered  rec D3 4000 IU daily + MVT   Dr. Melrose Nakayama 03/2020   -we discussed possible serious and likely etiologies, options for evaluation and workup, limitations of telemedicine visit vs in person visit, treatment, treatment risks and precautions. Pt prefers to treat via telemedicine empirically rather then risking or undertaking an in person visit at this moment. Patient agrees to seek prompt in person care if worsening, new symptoms arise, or if is not improving with treatment.   I discussed the assessment and treatment plan with the patient. The patient was provided an opportunity to ask questions and all were answered. The patient agreed with the plan and demonstrated an understanding of the instructions.   The patient was advised to call back or seek an in-person evaluation if the symptoms worsen or if the condition fails to improve as anticipated.  Time spent 20 minute s [name], MD

## 2020-01-04 ENCOUNTER — Other Ambulatory Visit: Payer: Self-pay | Admitting: Internal Medicine

## 2020-01-04 DIAGNOSIS — E1165 Type 2 diabetes mellitus with hyperglycemia: Secondary | ICD-10-CM

## 2020-01-04 MED ORDER — GLIPIZIDE ER 2.5 MG PO TB24: 2.5000 mg | ORAL_TABLET | Freq: Every day | ORAL | 3 refills | Status: DC

## 2020-01-09 ENCOUNTER — Encounter: Payer: Self-pay | Admitting: Internal Medicine

## 2020-01-12 ENCOUNTER — Ambulatory Visit (INDEPENDENT_AMBULATORY_CARE_PROVIDER_SITE_OTHER): Payer: PPO | Admitting: Pharmacist

## 2020-01-12 DIAGNOSIS — I63239 Cerebral infarction due to unspecified occlusion or stenosis of unspecified carotid arteries: Secondary | ICD-10-CM

## 2020-01-12 DIAGNOSIS — E1165 Type 2 diabetes mellitus with hyperglycemia: Secondary | ICD-10-CM | POA: Diagnosis not present

## 2020-01-12 DIAGNOSIS — I10 Essential (primary) hypertension: Secondary | ICD-10-CM | POA: Diagnosis not present

## 2020-01-12 NOTE — Patient Instructions (Signed)
Visit Information  Goals Addressed            This Visit's Progress     Patient Stated   . "I want to stay healthy" (pt-stated)       CARE PLAN ENTRY (see longtitudinal plan of care for additional care plan information)  Current Barriers:  . Complicated patient with multiple uncontrolled conditions; s/p CVA in 05/2019 o Wonders about qualifying for any sort of disability assistance, given her recent stroke and inability to work . Diabetes: uncontrolled, most recent A1c 8.0% . Current antihyperglycemic regimen: metformin XR 1000 mg daily, glipizide XR 2.5 mg QAM- glipizide just added . Denies hypoglycemic symptoms . Current blood glucose readings:   o Fasting: 90-100s o After meal: ~100 . Cardiovascular risk reduction: o Current hypertensive regimen: carvedilol 6.25 mg BID; lisinopril 20 mg daily; reports using a home wrist BP cuff, recent readings are variable: 145/73; 97/67; 129/73 o Current hyperlipidemia regimen: atorvastatin 40 mg daily; last LDL 71 o Current antiplatelet regimen: ASA 81 mg daily, clopidogrel 75 mg daily  Pharmacist Clinical Goal(s):  Marland Kitchen Over the next 90 days, patient will work with PharmD and primary care provider to address optimized ASCVD risk reduction  Interventions: . Comprehensive medication review performed, medication list updated in electronic medical record . Discussed current income. Denies any specific financial needs at this moment (food, utilities, medical bills), but wonders if she would qualify for any additional assistance due to her recent disability s/p CVA last year. She and her husband may qualify for Medicare Extra Help. Placed Care Guide referral for financial assistance . If patient is approved for Medicare Extra Help, copays for all generics would be ~$4/90 day and $9/90 day. This would be particularly useful for her DM treatment, and make it more financially feasible for medications, like SGLT2/GLP1, that provide glycemic benefit as well  as ASCVD risk reduction.  . Discussed inaccuracy of wrist BP cuffs. Will contact HTA to determine if she can receive a BP cuff  Patient Self Care Activities:  . Patient will check blood glucose daily, document, and provide at future appointments . Patient will take medications as prescribed . Patient will report any questions or concerns to provider   Please see past updates related to this goal by clicking on the "Past Updates" button in the selected goal         Patient verbalizes understanding of instructions provided today.    Plan:  - Scheduled f/u call 03/08/20  Catie [name], PharmD, BCACP, CPP Clinical Pharmacist [place] [phone]

## 2020-01-12 NOTE — Chronic Care Management (AMB) (Signed)
Chronic Care Management   Follow Up Note   01/12/2020 Name: Tammy Nunez MRN: [id number] DOB: 01-21-1951  Referred by: McLean-Scocuzza, Nino Glow, MD Reason for referral : Chronic Care Management (Medication Management)   Corrine Nunez is a 69 y.o. year old female who is a primary care patient of McLean-Scocuzza, Nino Glow, MD. The CCM team was consulted for assistance with chronic disease management and care coordination needs.    Contacted patient for medication management review.   Review of patient status, including review of consultants reports, relevant laboratory and other test results, and collaboration with appropriate care team members and the patient's provider was performed as part of comprehensive patient evaluation and provision of chronic care management services.    SDOH (Social Determinants of Health) assessments and interventions performed:   SDOH Interventions     Most Recent Value  SDOH Interventions  SDOH Interventions for the Following Domains  Financial Strain  Financial Strain Interventions  Other (Comment) [Care Guide referral for Medicare Extra Help application]       Outpatient Encounter Medications as of 01/12/2020  Medication Sig Note  . amLODipine (NORVASC) 5 MG tablet Take 1 tablet (5 mg total) by mouth daily.   Marland Kitchen aspirin EC 81 MG EC tablet Take 1 tablet (81 mg total) by mouth daily.   Marland Kitchen atorvastatin (LIPITOR) 40 MG tablet Take 1 tablet (40 mg total) by mouth daily at 6 PM.   . blood glucose meter kit and supplies 1 each by Other route 4 (four) times daily. Dispense based on patient and insurance preference. Four times daily as directed. (FOR ICD-10 E10.9, E11.9).   . carvedilol (COREG) 12.5 MG tablet Take 1 tablet (12.5 mg total) by mouth 2 (two) times daily with a meal.   . clopidogrel (PLAVIX) 75 MG tablet Take 1 tablet (75 mg total) by mouth daily.   Marland Kitchen glipiZIDE (GLUCOTROL XL) 2.5 MG 24 hr tablet Take 1 tablet (2.5 mg total) by mouth daily with  breakfast.   . lisinopril (ZESTRIL) 40 MG tablet Take 1 tablet (40 mg total) by mouth daily. 01/12/2020: QAM  . metFORMIN (GLUCOPHAGE XR) 500 MG 24 hr tablet Take 2 tablets (1,000 mg total) by mouth daily with breakfast.   . potassium chloride SA (KLOR-CON M20) 20 MEQ tablet Take 1 tablet (20 mEq total) by mouth daily.   . vitamin B-12 (CYANOCOBALAMIN) 1000 MCG tablet Take 1 tablet (1,000 mcg total) by mouth daily.   . Vitamin D, Cholecalciferol, 50 MCG (2000 UT) CAPS Take 4,000 Units by mouth.   Marland Kitchen acetaminophen (TYLENOL) 325 MG tablet Take 1-2 tablets (325-650 mg total) by mouth every 4 (four) hours as needed for mild pain.    No facility-administered encounter medications on file as of 01/12/2020.     Objective:   Goals Addressed            This Visit's Progress     Patient Stated   . "I want to stay healthy" (pt-stated)       CARE PLAN ENTRY (see longtitudinal plan of care for additional care plan information)  Current Barriers:  . Complicated patient with multiple uncontrolled conditions; s/p CVA in 05/2019 o Wonders about qualifying for any sort of disability assistance, given her recent stroke and inability to work . Diabetes: uncontrolled, most recent A1c 8.0% . Current antihyperglycemic regimen: metformin XR 1000 mg daily, glipizide XR 2.5 mg QAM- glipizide just added . Denies hypoglycemic symptoms . Current blood glucose readings:   o Fasting: 90-100s  o After meal: ~100 . Cardiovascular risk reduction: o Current hypertensive regimen: carvedilol 6.25 mg BID; lisinopril 20 mg daily; reports using a home wrist BP cuff, recent readings are variable: 145/73; 97/67; 129/73 o Current hyperlipidemia regimen: atorvastatin 40 mg daily; last LDL 71 o Current antiplatelet regimen: ASA 81 mg daily, clopidogrel 75 mg daily  Pharmacist Clinical Goal(s):  Marland Kitchen Over the next 90 days, patient will work with PharmD and primary care provider to address optimized ASCVD risk  reduction  Interventions: . Comprehensive medication review performed, medication list updated in electronic medical record . Discussed current income. Denies any specific financial needs at this moment (food, utilities, medical bills), but wonders if she would qualify for any additional assistance due to her recent disability s/p CVA last year. She and her husband may qualify for Medicare Extra Help. Placed Care Guide referral for financial assistance . If patient is approved for Medicare Extra Help, copays for all generics would be ~$4/90 day and $9/90 day. This would be particularly useful for her DM treatment, and make it more financially feasible for medications, like SGLT2/GLP1, that provide glycemic benefit as well as ASCVD risk reduction.  . Discussed inaccuracy of wrist BP cuffs. Will contact [employer]TA to determine if she can receive a BP cuff  Patient Self Care Activities:  . Patient will check blood glucose daily, document, and provide at future appointments . Patient will take medications as prescribed . Patient will report any questions or concerns to provider   Please see past updates related to this goal by clicking on the "Past Updates" button in the selected goal          Plan:  - Scheduled f/u call 03/08/20  Catie [name], PharmD, BCACP, [place] Pharmacist [place] [phone]

## 2020-01-13 DIAGNOSIS — I639 Cerebral infarction, unspecified: Secondary | ICD-10-CM | POA: Diagnosis not present

## 2020-01-19 ENCOUNTER — Telehealth: Payer: Self-pay

## 2020-01-19 ENCOUNTER — Telehealth: Payer: Self-pay | Admitting: Internal Medicine

## 2020-01-19 NOTE — Telephone Encounter (Signed)
01/19/2020 Spoke with patient about applying for [employer].  Patient did not feel comfortable sharing social security number and did not have her husband's information to complete the form.  She asked if it were possible to have the form mailed to her.  I contacted [employer] and the forms for her and her husband will be mailed within the next 2 weeks.  Let [name] know when to expect the forms and she has my number if she needs assistance. Tammy Nunez [phone]

## 2020-01-19 NOTE — Telephone Encounter (Signed)
Left message for patient to call back and schedule Medicare Annual Wellness Visit (AWV) either virtually or audio only.  No hx of AWV; please schedule at anytime with Denisa O'Brien-Blaney at [employer] [place]   

## 2020-01-31 ENCOUNTER — Telehealth: Payer: Self-pay | Admitting: Internal Medicine

## 2020-01-31 NOTE — Telephone Encounter (Signed)
Pt needs a refill on potassium chloride SA (KLOR-CON M20) 20 MEQ tablet sent to [facility]

## 2020-02-02 ENCOUNTER — Other Ambulatory Visit: Payer: Self-pay | Admitting: Internal Medicine

## 2020-02-02 DIAGNOSIS — E876 Hypokalemia: Secondary | ICD-10-CM

## 2020-02-02 MED ORDER — POTASSIUM CHLORIDE CRYS ER 20 MEQ PO TBCR: 20.0000 meq | EXTENDED_RELEASE_TABLET | Freq: Every day | ORAL | 1 refills | Status: DC

## 2020-02-06 ENCOUNTER — Telehealth: Payer: Self-pay

## 2020-02-06 NOTE — Telephone Encounter (Signed)
02/06/2020 Follow-up call.  Left message on voicemail for patient to return my call regarding Medicare Extrahelp application. [name] [phone]

## 2020-02-10 ENCOUNTER — Telehealth: Payer: Self-pay

## 2020-02-10 DIAGNOSIS — I639 Cerebral infarction, unspecified: Secondary | ICD-10-CM | POA: Diagnosis not present

## 2020-02-10 NOTE — Telephone Encounter (Signed)
02/10/2020 Follow-up call.  Left message on voicemail for patient to return my call regarding Medicare Extrahelp application. [name] [phone]

## 2020-02-14 ENCOUNTER — Encounter: Payer: Self-pay | Admitting: Internal Medicine

## 2020-02-15 ENCOUNTER — Ambulatory Visit (INDEPENDENT_AMBULATORY_CARE_PROVIDER_SITE_OTHER): Payer: PPO | Admitting: Internal Medicine

## 2020-02-15 ENCOUNTER — Other Ambulatory Visit: Payer: Self-pay

## 2020-02-15 ENCOUNTER — Encounter: Payer: Self-pay | Admitting: Internal Medicine

## 2020-02-15 VITALS — BP 128/84 | HR 85 | Temp 97.0°F | Ht 61.0 in | Wt 161.6 lb

## 2020-02-15 DIAGNOSIS — E1165 Type 2 diabetes mellitus with hyperglycemia: Secondary | ICD-10-CM

## 2020-02-15 DIAGNOSIS — I639 Cerebral infarction, unspecified: Secondary | ICD-10-CM

## 2020-02-15 DIAGNOSIS — E559 Vitamin D deficiency, unspecified: Secondary | ICD-10-CM

## 2020-02-15 DIAGNOSIS — I10 Essential (primary) hypertension: Secondary | ICD-10-CM | POA: Diagnosis not present

## 2020-02-15 MED ORDER — CARVEDILOL 25 MG PO TABS: 25.0000 mg | ORAL_TABLET | Freq: Two times a day (BID) | ORAL | 3 refills | Status: DC

## 2020-02-15 NOTE — Progress Notes (Signed)
Chief Complaint  Patient presents with  . Follow-up   F/u with husband of 40 years  1. Stroke with left arm>leg weakness limits her ability to work in [place] will f/u 03/2020 with [facility] neurology and considering disability HTN improved on coreg 12.5 mg bid, norvasc 5 mg qd, lis 40 mg qd  In wheelchair today   2. DM 2 A1c 8.0 on glipizide 2.5 mg qd and metformin 500 mg bid she is trying to watch what she eats   Review of Systems  Constitutional: Negative for weight loss.  HENT: Negative for hearing loss.   Eyes: Negative for blurred vision.  Respiratory: Negative for shortness of breath.   Cardiovascular: Negative for chest pain.  Gastrointestinal: Negative for abdominal pain.  Musculoskeletal: Negative for falls and joint pain.  Skin: Negative for rash.  Neurological: Positive for weakness.  Psychiatric/Behavioral: Negative for depression.   Past Medical History:  Diagnosis Date  . Chronic left shoulder pain   . Diabetes mellitus without complication ([place])   . Hypertension   . Paralysis ([place])    weakness left upper amd lower extremity  . Stroke [facility]    Past Surgical History:  Procedure Laterality Date  . CAROTID PTA/STENT INTERVENTION Left 07/20/2019   Procedure: CAROTID PTA/STENT INTERVENTION;  Surgeon: Katha Cabal, MD;  Location: [place];  Service: Cardiovascular;  Laterality: Left;  . ECTOPIC PREGNANCY SURGERY     Family History  Problem Relation Age of Onset  . Diabetes type II Mother   . Hypertension Father   . Diabetes type II Brother    Social History   Socioeconomic History  . Marital status: Married    Spouse name: Not on file  . Number of children: Not on file  . Years of education: Not on file  . Highest education level: Not on file  Occupational History  . Not on file  Tobacco Use  . Smoking status: Former Smoker    Packs/day: 0.50    Years: 15.00    Pack years: 7.50    Types: Cigarettes    Quit date: 04/11/2019    Years since  quitting: 0.8  . Smokeless tobacco: Never Used  Substance and Sexual Activity  . Alcohol use: Never  . Drug use: Never  . Sexual activity: Not Currently  Other Topics Concern  . Not on file  Social History Narrative   No kids    Married with husband    Used to work Personal assistant    Social Determinants of Health   Financial Resource Strain: Medium Risk  . Difficulty of Paying Living Expenses: Somewhat hard  Food Insecurity: Food Insecurity Present  . Worried About Charity fundraiser in the Last Year: Never true  . Ran Out of Food in the Last Year: Sometimes true  Transportation Needs: Unmet Transportation Needs  . Lack of Transportation (Medical): Yes  . Lack of Transportation (Non-Medical): Yes  Physical Activity: Inactive  . Days of Exercise per Week: 0 days  . Minutes of Exercise per Session: 0 min  Stress: Stress Concern Present  . Feeling of Stress : Very much  Social Connections: Not Isolated  . Frequency of Communication with Friends and Family: Three times a week  . Frequency of Social Gatherings with Friends and Family: Once a week  . Attends Religious Services: 1 to 4 times per year  . Active Member of Clubs or Organizations: Yes  . Attends Archivist Meetings: 1 to 4  times per year  . Marital Status: Married  Human resources officer Violence: Not At Risk  . Fear of Current or Ex-Partner: No  . Emotionally Abused: No  . Physically Abused: No  . Sexually Abused: No   Current Meds  Medication Sig  . amLODipine (NORVASC) 5 MG tablet Take 1 tablet (5 mg total) by mouth daily.  Marland Kitchen aspirin EC 81 MG EC tablet Take 1 tablet (81 mg total) by mouth daily.  Marland Kitchen atorvastatin (LIPITOR) 40 MG tablet Take 1 tablet (40 mg total) by mouth daily at 6 PM.  . blood glucose meter kit and supplies 1 each by Other route 4 (four) times daily. Dispense based on patient and insurance preference. Four times daily as directed. (FOR ICD-10 E10.9, E11.9).  . carvedilol  (COREG) 25 MG tablet Take 1 tablet (25 mg total) by mouth 2 (two) times daily with a meal.  . clopidogrel (PLAVIX) 75 MG tablet Take 1 tablet (75 mg total) by mouth daily.  Marland Kitchen glipiZIDE (GLUCOTROL XL) 2.5 MG 24 hr tablet Take 1 tablet (2.5 mg total) by mouth daily with breakfast.  . lisinopril (ZESTRIL) 40 MG tablet Take 1 tablet (40 mg total) by mouth daily.  . metFORMIN (GLUCOPHAGE XR) 500 MG 24 hr tablet Take 2 tablets (1,000 mg total) by mouth daily with breakfast.  . potassium chloride SA (KLOR-CON M20) 20 MEQ tablet Take 1 tablet (20 mEq total) by mouth daily.  . vitamin B-12 (CYANOCOBALAMIN) 1000 MCG tablet Take 1 tablet (1,000 mcg total) by mouth daily.  . Vitamin D, Cholecalciferol, 50 MCG (2000 UT) CAPS Take 4,000 Units by mouth.  . [DISCONTINUED] carvedilol (COREG) 12.5 MG tablet Take 1 tablet (12.5 mg total) by mouth 2 (two) times daily with a meal.   No Known Allergies Recent Results (from the past 2160 hour(s))  Hepatitis C antibody     Status: None   Collection Time: 12/08/19 10:15 AM  Result Value Ref Range   Hepatitis C Ab NON-REACTIVE NON-REACTI   SIGNAL TO CUT-OFF 0.03 <1.00    Comment: . HCV antibody was non-reactive. There is no laboratory  evidence of HCV infection. . In most cases, no further action is required. However, if recent HCV exposure is suspected, a test for HCV RNA (test code [phone]) is suggested. . For additional information please refer to http://education.questdiagnostics.com/faq/FAQ22v1 (This link is being provided for informational/ educational purposes only.) .   Vitamin D (25 hydroxy)     Status: Abnormal   Collection Time: 12/08/19 10:15 AM  Result Value Ref Range   VITD 21.69 (L) 30.00 - 100.00 ng/mL  Iron, TIBC and Ferritin Panel     Status: None   Collection Time: 12/08/19 10:15 AM  Result Value Ref Range   Iron 113 45 - 160 mcg/dL   TIBC 317 250 - 450 mcg/dL (calc)   %SAT 36 16 - 45 % (calc)   Ferritin 67 16 - 288 ng/mL  TSH      Status: None   Collection Time: 12/08/19 10:15 AM  Result Value Ref Range   TSH 3.52 0.35 - 4.50 uIU/mL  HgB A1c     Status: Abnormal   Collection Time: 12/08/19 10:15 AM  Result Value Ref Range   Hgb A1c MFr Bld 8.0 (H) 4.6 - 6.5 %    Comment: Glycemic Control Guidelines for People with Diabetes:Non Diabetic:  <6%Goal of Therapy: <7%Additional Action Suggested:  >8%   Lipid panel     Status: None   Collection Time:  12/08/19 10:15 AM  Result Value Ref Range   Cholesterol 134 0 - 200 mg/dL    Comment: ATP III Classification       Desirable:  < 200 mg/dL               Borderline High:  200 - 239 mg/dL          High:  > = 240 mg/dL   Triglycerides 101.0 0.0 - 149.0 mg/dL    Comment: Normal:  <150 mg/dLBorderline High:  150 - 199 mg/dL   HDL 42.80 >39.00 mg/dL   VLDL 20.2 0.0 - 40.0 mg/dL   LDL Cholesterol 71 0 - 99 mg/dL   Total CHOL/HDL Ratio 3     Comment:                Men          Women1/2 Average Risk     3.4          3.3Average Risk          5.0          4.42X Average Risk          9.6          7.13X Average Risk          15.0          11.0                       NonHDL 91.61     Comment: NOTE:  Non-HDL goal should be 30 mg/dL higher than patient's LDL goal (i.e. LDL goal of < 70 mg/dL, would have non-HDL goal of < 100 mg/dL)  CBC with Differential/Platelet     Status: Abnormal   Collection Time: 12/08/19 10:15 AM  Result Value Ref Range   WBC 7.0 4.0 - 10.5 K/uL   RBC 4.54 3.87 - 5.11 Mil/uL   Hemoglobin 13.5 12.0 - 15.0 g/dL   HCT 40.3 36.0 - 46.0 %   MCV 88.8 78.0 - 100.0 fl   MCHC 33.6 30.0 - 36.0 g/dL   RDW 15.7 (H) 11.5 - 15.5 %   Platelets 164.0 150.0 - 400.0 K/uL   Neutrophils Relative % 49.6 43.0 - 77.0 %   Lymphocytes Relative 40.0 12.0 - 46.0 %   Monocytes Relative 8.6 3.0 - 12.0 %   Eosinophils Relative 1.2 0.0 - 5.0 %   Basophils Relative 0.6 0.0 - 3.0 %   Neutro Abs 3.5 1.4 - 7.7 K/uL   Lymphs Abs 2.8 0.7 - 4.0 K/uL   Monocytes Absolute 0.6 0.1 - 1.0 K/uL    Eosinophils Absolute 0.1 0.0 - 0.7 K/uL   Basophils Absolute 0.0 0.0 - 0.1 K/uL  Comprehensive metabolic panel     Status: Abnormal   Collection Time: 12/08/19 10:15 AM  Result Value Ref Range   Sodium 141 135 - 145 mEq/L   Potassium 3.8 3.5 - 5.1 mEq/L   Chloride 109 96 - 112 mEq/L   CO2 23 19 - 32 mEq/L   Glucose, Bld 209 (H) 70 - 99 mg/dL   BUN 17 6 - 23 mg/dL   Creatinine, Ser 0.91 0.40 - 1.20 mg/dL   Total Bilirubin 0.2 0.2 - 1.2 mg/dL   Alkaline Phosphatase 53 39 - 117 U/L   AST 15 0 - 37 U/L   ALT 17 0 - 35 U/L   Total Protein 6.9 6.0 - 8.3 g/dL   Albumin 4.4 3.5 -  5.2 g/dL   GFR 74.20 >60.00 mL/min   Calcium 9.8 8.4 - 10.5 mg/dL  Microalbumin / creatinine urine ratio     Status: None   Collection Time: 12/08/19 10:16 AM  Result Value Ref Range   Creatinine, Urine 105 20 - 275 mg/dL   Microalb, Ur 1.0 mg/dL    Comment: Reference Range Not established    Microalb Creat Ratio 10 <30 mcg/mg creat    Comment: . The ADA defines abnormalities in albumin excretion as follows: Marland Kitchen Category         Result (mcg/mg creatinine) . Normal                    <30 Microalbuminuria         30-299  Clinical albuminuria   > OR = 300 . The ADA recommends that at least two of three specimens collected within a 3-6 month period be abnormal before considering a patient to be within a diagnostic category.   Urine Culture     Status: None   Collection Time: 12/08/19 10:16 AM   Specimen: Urine  Result Value Ref Range   MICRO NUMBER: [id number]    SPECIMEN QUALITY: Adequate    Sample Source NOT GIVEN    STATUS: FINAL    Result:      Growth of mixed flora was isolated, suggesting probable contamination. No further testing will be performed. If clinically indicated, recollection using a method to minimize contamination, with prompt transfer to Urine Culture Transport Tube, is  recommended.   Urinalysis, Routine w reflex microscopic     Status: Abnormal   Collection Time: 12/08/19 10:16  AM  Result Value Ref Range   Color, Urine YELLOW YELLOW   APPearance CLEAR CLEAR   Specific Gravity, Urine 1.023 1.001 - 1.03   pH 5.5 5.0 - 8.0   Glucose, UA 3+ (A) NEGATIVE   Bilirubin Urine NEGATIVE NEGATIVE   Ketones, ur NEGATIVE NEGATIVE   Hgb urine dipstick NEGATIVE NEGATIVE   Protein, ur NEGATIVE NEGATIVE   Nitrite NEGATIVE NEGATIVE   Leukocytes,Ua 2+ (A) NEGATIVE   WBC, UA 10-20 (A) 0 - 5 /HPF   RBC / HPF 0-2 0 - 2 /HPF   Squamous Epithelial / LPF 0-5 < OR = 5 /HPF   Bacteria, UA FEW (A) NONE SEEN /HPF   Uric Acid Crystal FEW NONE OR FE /HPF   Hyaline Cast 0-5 (A) NONE SEEN /LPF   Casts 1-3 WBC (A) NONE SEEN /LPF  HM DIABETES EYE EXAM     Status: None   Collection Time: 12/21/19 12:00 AM  Result Value Ref Range   HM Diabetic Eye Exam No Retinopathy No Retinopathy    Comment: vison source 12/21/19 negative DM changes Dr.Woodard graham   Objective  Body mass index is 30.53 kg/m. Wt Readings from Last 3 Encounters:  02/15/20 161 lb 9.6 oz (73.3 kg)  12/28/19 153 lb (69.4 kg)  11/30/19 163 lb (73.9 kg)   Temp Readings from Last 3 Encounters:  02/15/20 (!) 97 F (36.1 C) (Temporal)  09/12/19 98.3 F (36.8 C)  07/25/19 (!) 97.5 F (36.4 C) (Temporal)   BP Readings from Last 3 Encounters:  02/15/20 128/84  12/28/19 (!) 164/77  11/16/19 (!) 202/100   Pulse Readings from Last 3 Encounters:  02/15/20 85  11/16/19 73  09/12/19 78    Physical Exam Vitals and nursing note reviewed.  Constitutional:      Appearance: Normal appearance. She is well-developed and  well-groomed. She is obese.  HENT:     Head: Normocephalic and atraumatic.  Eyes:     Conjunctiva/sclera: Conjunctivae normal.     Pupils: Pupils are equal, round, and reactive to light.  Cardiovascular:     Rate and Rhythm: Normal rate and regular rhythm.     Heart sounds: Normal heart sounds. No murmur.  Pulmonary:     Effort: Pulmonary effort is normal.     Breath sounds: Normal breath sounds.   Abdominal:     General: Abdomen is flat. Bowel sounds are normal.     Tenderness: There is no abdominal tenderness.  Skin:    General: Skin is warm and dry.  Neurological:     General: No focal deficit present.     Mental Status: She is alert and oriented to person, place, and time. Mental status is at baseline.     Gait: Gait normal.     Comments: In wheelchair today  2+ reflex left BR Reduced sensation left upper arm  4-/5 strength LUE    Psychiatric:        Attention and Perception: Attention and perception normal.        Mood and Affect: Mood and affect normal.        Speech: Speech normal.        Behavior: Behavior normal. Behavior is cooperative.        Thought Content: Thought content normal.        Cognition and Memory: Cognition and memory normal.        Judgment: Judgment normal.     Assessment  Plan  Type 2 diabetes mellitus with hyperglycemia, without long-term current use of insulin ([place]) - Plan: Hemoglobin A1c Cont meds  Foot exam today  Eye exam [facility] seen 12/21/19 negative DR  Acute CVA (cerebrovascular accident) ([place]) - Plan: carvedilol (COREG) 25 MG tablet bid with other meds  Given info about [employer] disability in [place] or pt can disc this with neurology   Essential hypertension - Plan: carvedilol (COREG) 25 MG tablet bid from 12.5 mg bid Comprehensive metabolic panel, CBC w/Diff, Lipid panel  Vitamin D deficiency - Plan: Vitamin D (25 hydroxy)  D3 4000 IU qd   HM Flu shot declines 2019 had  Tdapdisc in future for now declines all vaccines as of 11/30/19 shingrix  prevnar pna 23  covid vx had 2/2    Mammogram 03/2019 [facility] normal per pt due 03/2020, ordered [place] &DEXA pt to call and sch disc today  Pap out of age window no h/o abnormal  Colonoscopy remotely ?In 1980s per ptRecord consider in future vs cologuard  No FH colon cancer  Call back when ready for cologuard 02/15/20 discussed this wants to wait  rec D3 4000  IU daily + MVT   Dr. Melrose Nakayama 03/2020 neurology  Provider: Dr. Olivia Mackie McLean-Scocuzza-Internal Medicine

## 2020-02-15 NOTE — Patient Instructions (Addendum)
Increase coreg 25 mg 2x per day if you have 12.5 mg pills take 2 2x per day the next will be 1 pill 2x per day    DASH Eating Plan DASH stands for "Dietary Approaches to Stop Hypertension." The DASH eating plan is a healthy eating plan that has been shown to reduce high blood pressure (hypertension). It may also reduce your risk for type 2 diabetes, heart disease, and stroke. The DASH eating plan may also help with weight loss. What are tips for following this plan?  General guidelines  Avoid eating more than 2,300 mg (milligrams) of salt (sodium) a day. If you have hypertension, you may need to reduce your sodium intake to 1,500 mg a day.  Limit alcohol intake to no more than 1 drink a day for nonpregnant women and 2 drinks a day for men. One drink equals 12 oz of beer, 5 oz of wine, or 1 oz of hard liquor.  Work with your health care provider to maintain a healthy body weight or to lose weight. Ask what an ideal weight is for you.  Get at least 30 minutes of exercise that causes your heart to beat faster (aerobic exercise) most days of the week. Activities may include walking, swimming, or biking.  Work with your health care provider or diet and nutrition specialist (dietitian) to adjust your eating plan to your individual calorie needs. Reading food labels   Check food labels for the amount of sodium per serving. Choose foods with less than 5 percent of the Daily Value of sodium. Generally, foods with less than 300 mg of sodium per serving fit into this eating plan.  To find whole grains, look for the word "whole" as the first word in the ingredient list. Shopping  Buy products labeled as "low-sodium" or "no salt added."  Buy fresh foods. Avoid canned foods and premade or frozen meals. Cooking  Avoid adding salt when cooking. Use salt-free seasonings or herbs instead of table salt or sea salt. Check with your health care provider or pharmacist before using salt substitutes.  Do  not fry foods. Cook foods using healthy methods such as baking, boiling, grilling, and broiling instead.  Cook with heart-healthy oils, such as olive, canola, soybean, or sunflower oil. Meal planning  Eat a balanced diet that includes: ? 5 or more servings of fruits and vegetables each day. At each meal, try to fill half of your plate with fruits and vegetables. ? Up to 6-8 servings of whole grains each day. ? Less than 6 oz of lean meat, poultry, or fish each day. A 3-oz serving of meat is about the same size as a deck of cards. One egg equals 1 oz. ? 2 servings of low-fat dairy each day. ? A serving of nuts, seeds, or beans 5 times each week. ? Heart-healthy fats. Healthy fats called Omega-3 fatty acids are found in foods such as flaxseeds and coldwater fish, like sardines, salmon, and mackerel.  Limit how much you eat of the following: ? Canned or prepackaged foods. ? Food that is high in trans fat, such as fried foods. ? Food that is high in saturated fat, such as fatty meat. ? Sweets, desserts, sugary drinks, and other foods with added sugar. ? Full-fat dairy products.  Do not salt foods before eating.  Try to eat at least 2 vegetarian meals each week.  Eat more home-cooked food and less restaurant, buffet, and fast food.  When eating at a restaurant, ask that  your food be prepared with less salt or no salt, if possible. What foods are recommended? The items listed may not be a complete list. Talk with your dietitian about what dietary choices are best for you. Grains Whole-grain or whole-wheat bread. Whole-grain or whole-wheat pasta. Brown rice. [place]. Bulgur. Whole-grain and low-sodium cereals. Pita bread. Low-fat, low-sodium crackers. Whole-wheat flour tortillas. Vegetables Fresh or frozen vegetables (raw, steamed, roasted, or grilled). Low-sodium or reduced-sodium tomato and vegetable juice. Low-sodium or reduced-sodium tomato sauce and tomato paste. Low-sodium or  reduced-sodium canned vegetables. Fruits All fresh, dried, or frozen fruit. Canned fruit in natural juice (without added sugar). Meat and other protein foods Skinless chicken or [place]. Ground chicken or [place]. Pork with fat trimmed off. Fish and seafood. Egg whites. Dried beans, peas, or lentils. Unsalted nuts, nut butters, and seeds. Unsalted canned beans. Lean cuts of beef with fat trimmed off. Low-sodium, lean deli meat. Dairy Low-fat (1%) or fat-free (skim) milk. Fat-free, low-fat, or reduced-fat cheeses. Nonfat, low-sodium ricotta or cottage cheese. Low-fat or nonfat yogurt. Low-fat, low-sodium cheese. Fats and oils Soft margarine without trans fats. Vegetable oil. Low-fat, reduced-fat, or light mayonnaise and salad dressings (reduced-sodium). Canola, safflower, olive, soybean, and sunflower oils. Avocado. Seasoning and other foods Herbs. Spices. Seasoning mixes without salt. Unsalted popcorn and pretzels. Fat-free sweets. What foods are not recommended? The items listed may not be a complete list. Talk with your dietitian about what dietary choices are best for you. Grains Baked goods made with fat, such as croissants, muffins, or some breads. Dry pasta or rice meal packs. Vegetables Creamed or fried vegetables. Vegetables in a cheese sauce. Regular canned vegetables (not low-sodium or reduced-sodium). Regular canned tomato sauce and paste (not low-sodium or reduced-sodium). Regular tomato and vegetable juice (not low-sodium or reduced-sodium). Angie Fava. Olives. Fruits Canned fruit in a light or heavy syrup. Fried fruit. Fruit in cream or butter sauce. Meat and other protein foods Fatty cuts of meat. Ribs. Fried meat. Berniece Salines. Sausage. Bologna and other processed lunch meats. Salami. Fatback. Hotdogs. Bratwurst. Salted nuts and seeds. Canned beans with added salt. Canned or smoked fish. Whole eggs or egg yolks. Chicken or [place] with skin. Dairy Whole or 2% milk, cream, and half-and-half.  Whole or full-fat cream cheese. Whole-fat or sweetened yogurt. Full-fat cheese. Nondairy creamers. Whipped toppings. Processed cheese and cheese spreads. Fats and oils Butter. Stick margarine. Lard. Shortening. Ghee. Bacon fat. Tropical oils, such as coconut, palm kernel, or palm oil. Seasoning and other foods Salted popcorn and pretzels. Onion salt, garlic salt, seasoned salt, table salt, and sea salt. Worcestershire sauce. Tartar sauce. Barbecue sauce. Teriyaki sauce. Soy sauce, including reduced-sodium. Steak sauce. Canned and packaged gravies. Fish sauce. Oyster sauce. Cocktail sauce. Horseradish that you find on the shelf. Ketchup. Mustard. Meat flavorings and tenderizers. Bouillon cubes. Hot sauce and Tabasco sauce. Premade or packaged marinades. Premade or packaged taco seasonings. Relishes. Regular salad dressings. Where to find more information:  National Heart, Lung, and [place]: https://wilson-eaton.com/  American Heart Association: www.heart.org Summary  The DASH eating plan is a healthy eating plan that has been shown to reduce high blood pressure (hypertension). It may also reduce your risk for type 2 diabetes, heart disease, and stroke.  With the DASH eating plan, you should limit salt (sodium) intake to 2,300 mg a day. If you have hypertension, you may need to reduce your sodium intake to 1,500 mg a day.  When on the DASH eating plan, aim to eat more fresh fruits and  vegetables, whole grains, lean proteins, low-fat dairy, and heart-healthy fats.  Work with your health care provider or diet and nutrition specialist (dietitian) to adjust your eating plan to your individual calorie needs. This information is not intended to replace advice given to you by your health care provider. Make sure you discuss any questions you have with your health care provider. Document Revised: 10/23/2017 Document Reviewed: 11/03/2016 Elsevier Patient Education  2020 [employer].

## 2020-02-17 ENCOUNTER — Telehealth: Payer: Self-pay

## 2020-02-17 NOTE — Telephone Encounter (Signed)
Noted  

## 2020-02-17 NOTE — Telephone Encounter (Signed)
02/17/2020 3rd attempt to follow-up with patient regarding Medicare Extrahelp application. Attempted to leave message voicemail did not pick up. Closing referral.  [name] [phone]

## 2020-02-27 ENCOUNTER — Other Ambulatory Visit: Payer: Self-pay | Admitting: Internal Medicine

## 2020-02-27 ENCOUNTER — Telehealth: Payer: Self-pay | Admitting: Internal Medicine

## 2020-02-27 DIAGNOSIS — E785 Hyperlipidemia, unspecified: Secondary | ICD-10-CM

## 2020-02-27 DIAGNOSIS — I639 Cerebral infarction, unspecified: Secondary | ICD-10-CM

## 2020-02-27 MED ORDER — CLOPIDOGREL BISULFATE 75 MG PO TABS: 75.0000 mg | ORAL_TABLET | Freq: Every day | ORAL | 0 refills | Status: DC

## 2020-02-27 MED ORDER — ATORVASTATIN CALCIUM 40 MG PO TABS: 40.0000 mg | ORAL_TABLET | Freq: Every day | ORAL | 3 refills | Status: DC

## 2020-02-27 NOTE — Telephone Encounter (Signed)
Pt needs refill on atorvastatin and clopidogrel. Pt currently has only 1 pill left. Please send as soon as you can to [place].

## 2020-02-27 NOTE — Telephone Encounter (Signed)
Further refills plavix [facility] neurology appt in 03/2020   Advise pt

## 2020-03-08 ENCOUNTER — Other Ambulatory Visit: Payer: Self-pay

## 2020-03-08 ENCOUNTER — Ambulatory Visit (INDEPENDENT_AMBULATORY_CARE_PROVIDER_SITE_OTHER): Payer: PPO | Admitting: Pharmacist

## 2020-03-08 DIAGNOSIS — E1165 Type 2 diabetes mellitus with hyperglycemia: Secondary | ICD-10-CM | POA: Diagnosis not present

## 2020-03-08 DIAGNOSIS — I10 Essential (primary) hypertension: Secondary | ICD-10-CM

## 2020-03-08 NOTE — Chronic Care Management (AMB) (Signed)
  Chronic Care Management   Note  03/08/2020 Name: Tammy Nunez MRN: [id number] DOB: December 09, 1950  Patient called back, noting that after reviewing her medication list once she got home, they realized they did not have glipizide.   Contacted patient, left message letting her know that the medication in question was sent to the pharmacy with refills in February, and that she can contact [facility] to ask to fill.   Catie [name], PharmD, [place], CPP Clinical Pharmacist [place] [phone]

## 2020-03-08 NOTE — Patient Instructions (Addendum)
It was great talking to you both today!  We completed an application for Medicare Extra Help for you both. Be on the lookout for a letter from the [place] regarding your approval. Call me when you get this.   Here is how I would fill your pill box:   Morning: Amlodipine 5 mg - blood pressure Carvedilol 25 mg - blood pressure, heart rate Metformin XR 500 mg - blood sugar Glipizide XR 2.5 mg - blood sugar  Aspirin 81 mg - stroke prevention Clopidogrel 75 mg - stroke prevention Potassium 20 mEq Vitamin B12 Vitamin D  Evening: Carvedilol 25 mg - blood pressure, heart rate Lisinopril 40 mg - blood pressure Atorvastatin 40 mg - cholesterol   Please call me with any questions or concerns!  Visit Information  Goals Addressed            This Visit's Progress     Patient Stated   . "I want to stay healthy" (pt-stated)       CARE PLAN ENTRY (see longtitudinal plan of care for additional care plan information)  Current Barriers:  . Complicated patient with multiple uncontrolled conditions; s/p CVA in 05/2019 o Previously decided to apply for Medicare Extra Help; however, patient was uncomfortable providing her financial information over the phone . Diabetes: uncontrolled, most recent A1c 8.0% . Current antihyperglycemic regimen: metformin XR 500 mg BID, glipizide XR 2.5 mg QAM . Denies hypoglycemic symptoms . Cardiovascular risk reduction: o Current hypertensive regimen: carvedilol 25 mg BID; lisinopril 40 mg daily; amlodipine 10 mg QAM; remembers home readings in the 120-130s, but cannot remember DBP o Current hyperlipidemia regimen: atorvastatin 40 mg daily; last LDL 71 o Current antiplatelet regimen: ASA 81 mg daily, clopidogrel 75 mg daily . Supplements: Vitamin B12 1000 mcg daily, Vitamin D 1000 units daily   Pharmacist Clinical Goal(s):  Marland Kitchen Over the next 90 days, patient will work with PharmD and primary care provider to address optimized ASCVD risk  reduction  Interventions: . Comprehensive medication review performed, medication list updated in electronic medical record . Inter-disciplinary care team collaboration (see longitudinal plan of care) . Provided w/ BID pill box and list of how to fill pill box, including indication for each medication. She and her husband are appreciative of this.  . Collaboratively completed Medicare Extra Help application. Counseled patient to be on the look out for the decision from [facility] in about 4-6 weeks, and to call me with any questions. If approved, brand medications (such as SGLT2/GLP1) will be ~$9/90 day supply  Patient Self Care Activities:  . Patient will check blood glucose daily, document, and provide at future appointments . Patient will take medications as prescribed . Patient will report any questions or concerns to provider   Please see past updates related to this goal by clicking on the "Past Updates" button in the selected goal         Print copy of patient instructions provided.   Plan:  - Scheduled f/u call 05/21/20  Catie [name], PharmD, BCACP, CPP Clinical Pharmacist [place] [phone]

## 2020-03-08 NOTE — Chronic Care Management (AMB) (Signed)
Chronic Care Management   Follow Up Note   03/08/2020 Name: Tammy Nunez MRN: [id number] DOB: July 25, 1951  Referred by: McLean-Scocuzza, Nino Glow, MD Reason for referral : Chronic Care Management (Medication Management)   Tammy Nunez is a 69 y.o. year old female who is a primary care patient of McLean-Scocuzza, Nino Glow, MD. The CCM team was consulted for assistance with chronic disease management and care coordination needs.    Met with patient face to face in the office with her husband, Izell [place].   Review of patient status, including review of consultants reports, relevant laboratory and other test results, and collaboration with appropriate care team members and the patient's provider was performed as part of comprehensive patient evaluation and provision of chronic care management services.    SDOH (Social Determinants of Health) assessments performed: No See Care Plan activities for detailed interventions related to [facility])     Outpatient Encounter Medications as of 03/08/2020  Medication Sig Note  . amLODipine (NORVASC) 5 MG tablet Take 1 tablet (5 mg total) by mouth daily.   Marland Kitchen aspirin EC 81 MG EC tablet Take 1 tablet (81 mg total) by mouth daily.   Marland Kitchen atorvastatin (LIPITOR) 40 MG tablet Take 1 tablet (40 mg total) by mouth daily at 6 PM.   . blood glucose meter kit and supplies 1 each by Other route 4 (four) times daily. Dispense based on patient and insurance preference. Four times daily as directed. (FOR ICD-10 E10.9, E11.9).   . carvedilol (COREG) 25 MG tablet Take 1 tablet (25 mg total) by mouth 2 (two) times daily with a meal.   . clopidogrel (PLAVIX) 75 MG tablet Take 1 tablet (75 mg total) by mouth daily. Further refills [facility] neurology   . glipiZIDE (GLUCOTROL XL) 2.5 MG 24 hr tablet Take 1 tablet (2.5 mg total) by mouth daily with breakfast.   . lisinopril (ZESTRIL) 40 MG tablet Take 1 tablet (40 mg total) by mouth daily. 01/12/2020: QAM  . metFORMIN (GLUCOPHAGE XR) 500 MG  24 hr tablet Take 2 tablets (1,000 mg total) by mouth daily with breakfast. 03/08/2020: Taking 1 tablet BID  . potassium chloride SA (KLOR-CON M20) 20 MEQ tablet Take 1 tablet (20 mEq total) by mouth daily.   . vitamin B-12 (CYANOCOBALAMIN) 1000 MCG tablet Take 1 tablet (1,000 mcg total) by mouth daily.   . Vitamin D, Cholecalciferol, 50 MCG (2000 UT) CAPS Take 4,000 Units by mouth.   Marland Kitchen acetaminophen (TYLENOL) 325 MG tablet Take 1-2 tablets (325-650 mg total) by mouth every 4 (four) hours as needed for mild pain. (Patient not taking: Reported on 02/15/2020)    No facility-administered encounter medications on file as of 03/08/2020.     Objective:   Goals Addressed            This Visit's Progress     Patient Stated   . "I want to stay healthy" (pt-stated)       CARE PLAN ENTRY (see longtitudinal plan of care for additional care plan information)  Current Barriers:  . Complicated patient with multiple uncontrolled conditions; s/p CVA in 05/2019 o Previously decided to apply for Medicare Extra Help; however, patient was uncomfortable providing her financial information over the phone . Diabetes: uncontrolled, most recent A1c 8.0% . Current antihyperglycemic regimen: metformin XR 500 mg BID, glipizide XR 2.5 mg QAM . Denies hypoglycemic symptoms . Cardiovascular risk reduction: o Current hypertensive regimen: carvedilol 25 mg BID; lisinopril 40 mg daily; amlodipine 10 mg QAM; remembers home  readings in the 120-130s, but cannot remember DBP o Current hyperlipidemia regimen: atorvastatin 40 mg daily; last LDL 71 o Current antiplatelet regimen: ASA 81 mg daily, clopidogrel 75 mg daily . Supplements: Vitamin B12 1000 mcg daily, Vitamin D 1000 units daily   Pharmacist Clinical Goal(s):  Marland Kitchen Over the next 90 days, patient will work with PharmD and primary care provider to address optimized ASCVD risk reduction  Interventions: . Comprehensive medication review performed, medication list  updated in electronic medical record . Inter-disciplinary care team collaboration (see longitudinal plan of care) . Provided w/ BID pill box and list of how to fill pill box, including indication for each medication. She and her husband are appreciative of this.  . Collaboratively completed Medicare Extra Help application. Counseled patient to be on the look out for the decision from [facility] in about 4-6 weeks, and to call me with any questions. If approved, brand medications (such as SGLT2/GLP1) will be ~$9/90 day supply  Patient Self Care Activities:  . Patient will check blood glucose daily, document, and provide at future appointments . Patient will take medications as prescribed . Patient will report any questions or concerns to provider   Please see past updates related to this goal by clicking on the "Past Updates" button in the selected goal          Plan:  - Scheduled f/u call 05/21/20  Catie [name], PharmD, BCACP, [place] Pharmacist [place] [phone]

## 2020-03-12 DIAGNOSIS — I639 Cerebral infarction, unspecified: Secondary | ICD-10-CM | POA: Diagnosis not present

## 2020-03-28 DIAGNOSIS — Z8673 Personal history of transient ischemic attack (TIA), and cerebral infarction without residual deficits: Secondary | ICD-10-CM | POA: Diagnosis not present

## 2020-03-28 DIAGNOSIS — R531 Weakness: Secondary | ICD-10-CM | POA: Diagnosis not present

## 2020-04-11 DIAGNOSIS — I639 Cerebral infarction, unspecified: Secondary | ICD-10-CM | POA: Diagnosis not present

## 2020-04-17 ENCOUNTER — Other Ambulatory Visit: Payer: Self-pay

## 2020-04-17 ENCOUNTER — Ambulatory Visit: Payer: PPO

## 2020-04-17 ENCOUNTER — Other Ambulatory Visit: Payer: Self-pay | Admitting: Internal Medicine

## 2020-04-17 ENCOUNTER — Other Ambulatory Visit: Payer: PPO

## 2020-04-17 VITALS — BP 153/73 | HR 70

## 2020-04-17 DIAGNOSIS — I10 Essential (primary) hypertension: Secondary | ICD-10-CM

## 2020-04-17 MED ORDER — AMLODIPINE BESYLATE 10 MG PO TABS: 10.0000 mg | ORAL_TABLET | Freq: Every day | ORAL | 3 refills | Status: DC

## 2020-04-17 NOTE — Progress Notes (Signed)
Patient is here for a BP check due to bp being high at last visit, as per patient.  Currently patients BP is 153/72 and BPM is 70. Ten minutes prior BP was 162/74 and BMP was 62. Patient has no complaints of headaches, blurry vision, chest pain, arm pain, light headedness, dizziness, and nor jaw pain.   Spoken to Dr Olivia Mackie, she stated to increase norvasc to 10mg  daily. Patient has been informed.

## 2020-05-12 DIAGNOSIS — I639 Cerebral infarction, unspecified: Secondary | ICD-10-CM | POA: Diagnosis not present

## 2020-05-21 ENCOUNTER — Ambulatory Visit (INDEPENDENT_AMBULATORY_CARE_PROVIDER_SITE_OTHER): Payer: PPO | Admitting: Pharmacist

## 2020-05-21 ENCOUNTER — Other Ambulatory Visit: Payer: Self-pay | Admitting: Internal Medicine

## 2020-05-21 DIAGNOSIS — E119 Type 2 diabetes mellitus without complications: Secondary | ICD-10-CM

## 2020-05-21 DIAGNOSIS — I10 Essential (primary) hypertension: Secondary | ICD-10-CM | POA: Diagnosis not present

## 2020-05-21 DIAGNOSIS — E1165 Type 2 diabetes mellitus with hyperglycemia: Secondary | ICD-10-CM

## 2020-05-21 DIAGNOSIS — I63239 Cerebral infarction due to unspecified occlusion or stenosis of unspecified carotid arteries: Secondary | ICD-10-CM

## 2020-05-21 DIAGNOSIS — Z8673 Personal history of transient ischemic attack (TIA), and cerebral infarction without residual deficits: Secondary | ICD-10-CM

## 2020-05-21 MED ORDER — METFORMIN HCL ER 500 MG PO TB24: 1000.0000 mg | ORAL_TABLET | Freq: Every day | ORAL | 1 refills | Status: DC

## 2020-05-21 NOTE — Addendum Note (Signed)
Addended by: Thressa Sheller on: 05/21/2020 09:32 AM   Modules accepted: Orders

## 2020-05-21 NOTE — Telephone Encounter (Signed)
Metformin Medication sent in to preferred pharmacy per protocol.    Patient will need to contact their neurologist for refills of the Plavix. Left message to return call.

## 2020-05-21 NOTE — Chronic Care Management (AMB) (Signed)
Chronic Care Management   Follow Up Note   05/21/2020 Name: Tammy Nunez MRN: [id number] DOB: [birth year]  Referred by: McLean-Scocuzza, Nino Glow, MD Reason for referral : Chronic Care Management (Medication Management)   Tammy Nunez is a 69 y.o. year old female who is a primary care patient of McLean-Scocuzza, Nino Glow, MD. The CCM team was consulted for assistance with chronic disease management and care coordination needs.    Contacted patient for medication management review.   Review of patient status, including review of consultants reports, relevant laboratory and other test results, and collaboration with appropriate care team members and the patient's provider was performed as part of comprehensive patient evaluation and provision of chronic care management services.    SDOH (Social Determinants of Health) assessments performed: Yes See Care Plan activities for detailed interventions related to SDOH)  SDOH Interventions     Most Recent Value  SDOH Interventions  Financial Strain Interventions Other (Comment)  [medicare extra help approval]       Outpatient Encounter Medications as of 05/21/2020  Medication Sig Note  . acetaminophen (TYLENOL) 325 MG tablet Take 1-2 tablets (325-650 mg total) by mouth every 4 (four) hours as needed for mild pain.   Marland Kitchen amLODipine (NORVASC) 10 MG tablet Take 1 tablet (10 mg total) by mouth daily.   Marland Kitchen aspirin EC 81 MG EC tablet Take 1 tablet (81 mg total) by mouth daily.   Marland Kitchen atorvastatin (LIPITOR) 40 MG tablet Take 1 tablet (40 mg total) by mouth daily at 6 PM.   . blood glucose meter kit and supplies 1 each by Other route 4 (four) times daily. Dispense based on patient and insurance preference. Four times daily as directed. (FOR ICD-10 E10.9, E11.9).   . carvedilol (COREG) 25 MG tablet Take 1 tablet (25 mg total) by mouth 2 (two) times daily with a meal.   . clopidogrel (PLAVIX) 75 MG tablet Take 1 tablet (75 mg total) by mouth daily. Further  refills [facility] neurology   . glipiZIDE (GLUCOTROL XL) 2.5 MG 24 hr tablet Take 1 tablet (2.5 mg total) by mouth daily with breakfast.   . lisinopril (ZESTRIL) 40 MG tablet Take 1 tablet (40 mg total) by mouth daily. 01/12/2020: QAM  . metFORMIN (GLUCOPHAGE XR) 500 MG 24 hr tablet Take 2 tablets (1,000 mg total) by mouth daily with breakfast.   . potassium chloride SA (KLOR-CON M20) 20 MEQ tablet Take 1 tablet (20 mEq total) by mouth daily.   . vitamin B-12 (CYANOCOBALAMIN) 1000 MCG tablet Take 1 tablet (1,000 mcg total) by mouth daily.   . Vitamin D, Cholecalciferol, 50 MCG (2000 UT) CAPS Take 4,000 Units by mouth.    No facility-administered encounter medications on file as of 05/21/2020.     Objective:   Goals Addressed              This Visit's Progress     Patient Stated   .  "I want to stay healthy" (pt-stated)        CARE PLAN ENTRY (see longtitudinal plan of care for additional care plan information)  Current Barriers:  . Complicated patient with multiple uncontrolled conditions; s/p CVA in 05/2019 o Patient reports that she received a letter showing notice of APPROVAL of Medicare Extra Help. All generic copays are $3.70/30 or 90 day supply, all brand copays are $9.20/30 or 90 day supply. o Requests the phone number for [facility] to call and request clopidogrel refill  . Diabetes: uncontrolled, but improved  per BG readings; most recent A1c 8.0% . Current antihyperglycemic regimen: metformin XR 1000 mg QAM, glipizide XR 2.5 mg QAM . Reports 1 episode of hypoglycemia between 70-80, denies s/sx hypoglycemia . Current glucose readings:  o Fastings: 82-110s o After meals: cannot recall today, thinks ~100s, denies anything >180 . Cardiovascular risk reduction: o Current hypertensive regimen: carvedilol 25 mg BID; lisinopril 40 mg QAM amlodipine 10 mg QAM; reports home BP readings in the mornings at least 3 hours after taking BP medications: 120s/70-80s; reports 1 reading  140/77 o Current hyperlipidemia regimen: atorvastatin 40 mg daily; last LDL 71 o Current antiplatelet regimen: ASA 81 mg daily, clopidogrel 75 mg daily (s/p carotid stent, neurology recommends continued DAPT) . Supplements: Vitamin B12 1000 mcg daily, Vitamin D 1000 units daily   Pharmacist Clinical Goal(s):  Marland Kitchen Over the next 90 days, patient will work with PharmD and primary care provider to address optimized ASCVD risk reduction  Interventions: . Comprehensive medication review performed, medication list updated in electronic medical record . Inter-disciplinary care team collaboration (see longitudinal plan of care) . Reviewed goal A1c, goal fasting, and goal 2 hour post prandial glucose. Will avoid making changes until A1c is seen at PCP appt in August, but likely will be able to d/c glipizide. If further glucose reduction is needed, consider maximization of metformin XR vs SGLT2 (as cost is no longer prohibitive w/ Extra Help approval). Encouraged to continue to check fasting and 2 hour post prandial readings, and write down to bring to next PCP appt . Reviewed goal BP given hx CVA. Reported BP much better controlled at home than at most recent office visits, but appears prior-to-meds BP are higher.  Will have patient continue carvedilol BID and lisinopril QAM, but move amlodipine to QPM to provide better BP lowering overnight/early morning. Encouraged patient to continue checking BP daily, but alternate between AM and PM readings. Encouraged to write down readings and bring to next appt w/ PCP. If further BP lowering needed, consider addition of spironolactone . Provided phone number for [facility] to request they take over clopidogrel refills  Patient Self Care Activities:  . Patient will check blood glucose daily, document, and provide at future appointments . Patient will take medications as prescribed . Patient will report any questions or concerns to provider   Please see past updates related to  this goal by clicking on the "Past Updates" button in the selected goal          Plan:  - Scheduled f/u call in ~ 12 weeks (~4 weeks after upcoming PCP appt)   Catie [name], PharmD, BCACP, CPP Clinical Pharmacist [place] [phone]

## 2020-05-21 NOTE — Telephone Encounter (Signed)
Patient calling back in and was informed of the below. Patient verbalized understanding and will contact her neurologist.

## 2020-05-21 NOTE — Patient Instructions (Addendum)
Ms. Villela,   Keep up the GREAT work with your medications, blood sugars, and blood pressures!  Between now and when you see Dr. Terese Door in August, please do the following:   1) Check your blood sugars twice daily - fasting, before you eat breakfast, and then about 2 hours after a meal. Write down the readings in the logs I have enclosed.   2) Check your blood pressure daily, but alternate between morning and afternoon/evening. We want to make sure your blood pressures stay well controlled throughout the day  3) Start taking amlodipine in the evening. Continue lisinopril in the mornings, and continue carvedilol twice daily. Moving the amlodipine to the evenings should help your early morning blood pressures be better controlled.   Here is how you should fill your pill box:   Morning: Carvedilol 25 mg - blood pressure, heart rate Lisinopril 40 mg - blood pressure Metformin XR 1000 mg (2 tablets)- blood sugar Glipizide XR 2.5 mg - blood sugar  Aspirin 81 mg - stroke prevention Clopidogrel 75 mg - stroke prevention Potassium 20 mEq Vitamin B12 Vitamin D  Evening: Carvedilol 25 mg - blood pressure, heart rate Amlodipine 10 mg - blood pressure Atorvastatin 40 mg - cholesterol  Call me with any questions or concerns!  Catie [name], PharmD [phone]  Visit Information  Goals Addressed              This Visit's Progress     Patient Stated   .  "I want to stay healthy" (pt-stated)        CARE PLAN ENTRY (see longtitudinal plan of care for additional care plan information)  Current Barriers:  . Complicated patient with multiple uncontrolled conditions; s/p CVA in 05/2019 o Patient reports that she received a letter showing notice of APPROVAL of Medicare Extra Help. All generic copays are $3.70/30 or 90 day supply, all brand copays are $9.20/30 or 90 day supply. o Requests the phone number for [facility] to call and request clopidogrel refill  . Diabetes:  uncontrolled, but improved per BG readings; most recent A1c 8.0% . Current antihyperglycemic regimen: metformin XR 1000 mg QAM, glipizide XR 2.5 mg QAM . Reports 1 episode of hypoglycemia between 70-80, denies s/sx hypoglycemia . Current glucose readings:  o Fastings: 82-110s o After meals: cannot recall today, thinks ~100s, denies anything >180 . Cardiovascular risk reduction: o Current hypertensive regimen: carvedilol 25 mg BID; lisinopril 40 mg QAM amlodipine 10 mg QAM; reports home BP readings in the mornings at least 3 hours after taking BP medications: 120s/70-80s; reports 1 reading 140/77 o Current hyperlipidemia regimen: atorvastatin 40 mg daily; last LDL 71 o Current antiplatelet regimen: ASA 81 mg daily, clopidogrel 75 mg daily (s/p carotid stent, neurology recommends continued DAPT) . Supplements: Vitamin B12 1000 mcg daily, Vitamin D 1000 units daily   Pharmacist Clinical Goal(s):  Marland Kitchen Over the next 90 days, patient will work with PharmD and primary care provider to address optimized ASCVD risk reduction  Interventions: . Comprehensive medication review performed, medication list updated in electronic medical record . Inter-disciplinary care team collaboration (see longitudinal plan of care) . Reviewed goal A1c, goal fasting, and goal 2 hour post prandial glucose. Will avoid making changes until A1c is seen at PCP appt in August, but likely will be able to d/c glipizide. If further glucose reduction is needed, consider maximization of metformin XR vs SGLT2 (as cost is no longer prohibitive w/ Extra Help approval). Encouraged to continue to check fasting and  2 hour post prandial readings, and write down to bring to next PCP appt . Reviewed goal BP given hx CVA. Reported BP much better controlled at home than at most recent office visits, but appears prior-to-meds BP are higher.  Will have patient continue carvedilol BID and lisinopril QAM, but move amlodipine to QPM to provide better BP  lowering overnight/early morning. Encouraged patient to continue checking BP daily, but alternate between AM and PM readings. Encouraged to write down readings and bring to next appt w/ PCP. If further BP lowering needed, consider addition of spironolactone . Provided phone number for [facility] to request they take over clopidogrel refills  Patient Self Care Activities:  . Patient will check blood glucose daily, document, and provide at future appointments . Patient will take medications as prescribed . Patient will report any questions or concerns to provider   Please see past updates related to this goal by clicking on the "Past Updates" button in the selected goal         The patient verbalized understanding of instructions provided today and agreed to receive a mailed copy of patient instruction and/or educational materials.  Plan:  - Scheduled f/u call in ~ 12 weeks (~4 weeks after upcoming PCP appt)   Catie [name], PharmD, [place], CPP Clinical Pharmacist [place] [phone]

## 2020-05-21 NOTE — Telephone Encounter (Signed)
Pt needs refills on metFORMIN (GLUCOPHAGE XR) 500 MG 24 hr tablet and clopidogrel (PLAVIX) 75 MG tablet sent to [facility]

## 2020-06-11 DIAGNOSIS — I639 Cerebral infarction, unspecified: Secondary | ICD-10-CM | POA: Diagnosis not present

## 2020-07-10 ENCOUNTER — Other Ambulatory Visit: Payer: Self-pay

## 2020-07-10 ENCOUNTER — Telehealth (INDEPENDENT_AMBULATORY_CARE_PROVIDER_SITE_OTHER): Payer: PPO | Admitting: Internal Medicine

## 2020-07-10 ENCOUNTER — Encounter: Payer: Self-pay | Admitting: Internal Medicine

## 2020-07-10 VITALS — BP 111/66 | Ht 61.0 in | Wt 161.6 lb

## 2020-07-10 DIAGNOSIS — M79605 Pain in left leg: Secondary | ICD-10-CM

## 2020-07-10 DIAGNOSIS — I10 Essential (primary) hypertension: Secondary | ICD-10-CM

## 2020-07-10 NOTE — Progress Notes (Signed)
Telephone Note  I connected with Tammy Nunez  on 07/10/20 at 11:45 AM EDT by telephone and verified that I am speaking with the correct person using two identifiers.  Location patient: home Location provider:work or home office Persons participating in the virtual visit: patient, provider  I discussed the limitations of evaluation and management by telemedicine and the availability of in person appointments. The patient expressed understanding and agreed to proceed.   HPI: 1. HTN f/u doing well BP 111/66 today norvasc 10 mg qd, coreg 25 mg bid, lis 40 mg qd  BP was 111/66, 124/82, 122/72, 112/78 denies lightheadedness or dizziness  2. Left leg pain worse in the cold    ROS: See pertinent positives and negatives per HPI.  Past Medical History:  Diagnosis Date  . Chronic left shoulder pain   . Diabetes mellitus without complication ([place])   . Hypertension   . Paralysis (Tammy Nunez)    weakness left upper amd lower extremity  . Stroke [facility])     Past Surgical History:  Procedure Laterality Date  . CAROTID PTA/STENT INTERVENTION Left 07/20/2019   Procedure: CAROTID PTA/STENT INTERVENTION;  Surgeon: Katha Cabal, MD;  Location: [facility] [facility];  Service: Cardiovascular;  Laterality: Left;  . ECTOPIC PREGNANCY SURGERY      Family History  Problem Relation Age of Onset  . Diabetes type II Mother   . Hypertension Father   . Diabetes type II Brother     SOCIAL HX: lives with husband   Current Outpatient Medications:  .  acetaminophen (TYLENOL) 325 MG tablet, Take 1-2 tablets (325-650 mg total) by mouth every 4 (four) hours as needed for mild pain., Disp:  , Rfl:  .  amLODipine (NORVASC) 10 MG tablet, Take 1 tablet (10 mg total) by mouth daily., Disp: 90 tablet, Rfl: 3 .  aspirin EC 81 MG EC tablet, Take 1 tablet (81 mg total) by mouth daily., Disp:  , Rfl:  .  atorvastatin (LIPITOR) 40 MG tablet, Take 1 tablet (40 mg total) by mouth daily at 6 PM., Disp: 90 tablet, Rfl:  3 .  blood glucose meter kit and supplies, 1 each by Other route 4 (four) times daily. Dispense based on patient and insurance preference. Four times daily as directed. (FOR ICD-10 E10.9, E11.9)., Disp: 1 each, Rfl: 0 .  carvedilol (COREG) 25 MG tablet, Take 1 tablet (25 mg total) by mouth 2 (two) times daily with a meal., Disp: 180 tablet, Rfl: 3 .  clopidogrel (PLAVIX) 75 MG tablet, Take 1 tablet (75 mg total) by mouth daily. Further refills [facility] neurology, Disp: 90 tablet, Rfl: 0 .  glipiZIDE (GLUCOTROL XL) 2.5 MG 24 hr tablet, Take 1 tablet (2.5 mg total) by mouth daily with breakfast., Disp: 90 tablet, Rfl: 3 .  lisinopril (ZESTRIL) 40 MG tablet, Take 1 tablet (40 mg total) by mouth daily., Disp: 90 tablet, Rfl: 3 .  metFORMIN (GLUCOPHAGE XR) 500 MG 24 hr tablet, Take 2 tablets (1,000 mg total) by mouth daily with breakfast., Disp: 180 tablet, Rfl: 1 .  potassium chloride SA (KLOR-CON M20) 20 MEQ tablet, Take 1 tablet (20 mEq total) by mouth daily., Disp: 90 tablet, Rfl: 1 .  vitamin B-12 (CYANOCOBALAMIN) 1000 MCG tablet, Take 1 tablet (1,000 mcg total) by mouth daily., Disp: 30 tablet, Rfl: 1 .  Vitamin D, Cholecalciferol, 50 MCG (2000 UT) CAPS, Take 4,000 Units by mouth., Disp: , Rfl:   EXAM:  VITALS per patient if applicable:  GENERAL: alert, oriented,  appears well and in no acute distress  PSYCH/NEURO: pleasant and cooperative, no obvious depression or anxiety, speech and thought processing grossly intact  ASSESSMENT AND PLAN:  Discussed the following assessment and plan:  Essential hypertension Cont meds  Monitor BP  Left leg pain Call back if worse consider xray low back in future Consider MRI lumbar  HM Flu shot declines 2019 had  Tdapdisc in future for now declines all vaccines as of 11/30/19 shingrix  prevnar pna 23 covid vx had 2/2   Mammogram 03/2019 [facility] normal per pt due 03/2020, ordered [place] &DEXA pt to call and sch disc today again has not sch  07/11/20 due to dad +MI  Call when ready  Pap out of age window no h/o abnormal  Colonoscopy remotely ?In 1980s per ptRecord consider in futurevs cologuard No FH colon cancer   Call back when ready for cologuard 07/11/20 discussed again this wants to wait  rec D3 4000 IU daily + MVT   Dr. Melrose Nakayama 5/2021neurology H/o stroke given [employer] disability phone # to apply for disability   -we discussed possible serious and likely etiologies, options for evaluation and workup, limitations of telemedicine visit vs in person visit, treatment, treatment risks and precautions. Pt prefers to treat via telemedicine empirically rather then risking or undertaking an in person visit at this moment. Patient agrees to seek prompt in person care if worsening, new symptoms arise, or if is not improving with treatment.   I discussed the assessment and treatment plan with the patient. The patient was provided an opportunity to ask questions and all were answered. The patient agreed with the plan and demonstrated an understanding of the instructions.   The patient was advised to call back or seek an in-person evaluation if the symptoms worsen or if the condition fails to improve as anticipated.  Time spent 20 minutes  [name], MD

## 2020-07-12 DIAGNOSIS — I639 Cerebral infarction, unspecified: Secondary | ICD-10-CM | POA: Diagnosis not present

## 2020-07-16 ENCOUNTER — Telehealth: Payer: Self-pay | Admitting: Internal Medicine

## 2020-07-16 NOTE — Telephone Encounter (Signed)
Patient needs a refill on her potassium chloride SA (KLOR-CON M20) 20 MEQ tablet. Tammy Nunez

## 2020-07-20 ENCOUNTER — Other Ambulatory Visit: Payer: Self-pay | Admitting: Internal Medicine

## 2020-07-20 DIAGNOSIS — E876 Hypokalemia: Secondary | ICD-10-CM

## 2020-07-20 MED ORDER — POTASSIUM CHLORIDE CRYS ER 20 MEQ PO TBCR: 20.0000 meq | EXTENDED_RELEASE_TABLET | Freq: Every day | ORAL | 1 refills | Status: DC

## 2020-07-30 ENCOUNTER — Other Ambulatory Visit: Payer: Self-pay | Admitting: Internal Medicine

## 2020-07-30 DIAGNOSIS — E876 Hypokalemia: Secondary | ICD-10-CM

## 2020-07-30 MED ORDER — POTASSIUM CHLORIDE CRYS ER 20 MEQ PO TBCR: 20.0000 meq | EXTENDED_RELEASE_TABLET | Freq: Every day | ORAL | 3 refills | Status: DC

## 2020-08-02 ENCOUNTER — Other Ambulatory Visit (INDEPENDENT_AMBULATORY_CARE_PROVIDER_SITE_OTHER): Payer: PPO

## 2020-08-02 ENCOUNTER — Other Ambulatory Visit: Payer: Self-pay

## 2020-08-02 DIAGNOSIS — E559 Vitamin D deficiency, unspecified: Secondary | ICD-10-CM

## 2020-08-02 DIAGNOSIS — E1165 Type 2 diabetes mellitus with hyperglycemia: Secondary | ICD-10-CM | POA: Diagnosis not present

## 2020-08-02 DIAGNOSIS — I10 Essential (primary) hypertension: Secondary | ICD-10-CM

## 2020-08-02 LAB — COMPREHENSIVE METABOLIC PANEL
ALT: 12 U/L (ref 0–35)
AST: 12 U/L (ref 0–37)
Albumin: 4.8 g/dL (ref 3.5–5.2)
Alkaline Phosphatase: 60 U/L (ref 39–117)
BUN: 14 mg/dL (ref 6–23)
CO2: 25 mEq/L (ref 19–32)
Calcium: 10 mg/dL (ref 8.4–10.5)
Chloride: 104 mEq/L (ref 96–112)
Creatinine, Ser: 0.93 mg/dL (ref 0.40–1.20)
GFR: 72.22 mL/min (ref >60.00)
Glucose, Bld: 179 mg/dL — ABNORMAL HIGH (ref 70–99)
Potassium: 4.4 mEq/L (ref 3.5–5.1)
Sodium: 139 mEq/L (ref 135–145)
Total Bilirubin: 0.4 mg/dL (ref 0.2–1.2)
Total Protein: 7.2 g/dL (ref 6.0–8.3)

## 2020-08-02 LAB — HEMOGLOBIN A1C: Hgb A1c MFr Bld: 7.5 % — ABNORMAL HIGH (ref 4.6–6.5)

## 2020-08-02 LAB — LIPID PANEL
Cholesterol: 167 mg/dL (ref 0–200)
HDL: 49.9 mg/dL (ref >39.00)
LDL Cholesterol: 104 mg/dL — ABNORMAL HIGH (ref 0–99)
NonHDL: 117.1
Total CHOL/HDL Ratio: 3
Triglycerides: 65 mg/dL (ref 0.0–149.0)
VLDL: 13 mg/dL (ref 0.0–40.0)

## 2020-08-02 LAB — CBC WITH DIFFERENTIAL/PLATELET
Basophils Absolute: 0.1 10*3/uL (ref 0.0–0.1)
Basophils Relative: 0.8 % (ref 0.0–3.0)
Eosinophils Absolute: 0.1 10*3/uL (ref 0.0–0.7)
Eosinophils Relative: 1.4 % (ref 0.0–5.0)
HCT: 39.1 % (ref 36.0–46.0)
Hemoglobin: 13.2 g/dL (ref 12.0–15.0)
Lymphocytes Relative: 34.1 % (ref 12.0–46.0)
Lymphs Abs: 2.3 10*3/uL (ref 0.7–4.0)
MCHC: 33.7 g/dL (ref 30.0–36.0)
MCV: 93.6 fl (ref 78.0–100.0)
Monocytes Absolute: 0.4 10*3/uL (ref 0.1–1.0)
Monocytes Relative: 6.6 % (ref 3.0–12.0)
Neutro Abs: 3.8 10*3/uL (ref 1.4–7.7)
Neutrophils Relative %: 57.1 % (ref 43.0–77.0)
Platelets: 179 10*3/uL (ref 150.0–400.0)
RBC: 4.18 Mil/uL (ref 3.87–5.11)
RDW: 13.9 % (ref 11.5–15.5)
WBC: 6.6 10*3/uL (ref 4.0–10.5)

## 2020-08-02 LAB — VITAMIN D 25 HYDROXY (VIT D DEFICIENCY, FRACTURES): VITD: 65.22 ng/mL (ref 30.00–100.00)

## 2020-08-05 ENCOUNTER — Other Ambulatory Visit: Payer: Self-pay | Admitting: Internal Medicine

## 2020-08-05 DIAGNOSIS — E1165 Type 2 diabetes mellitus with hyperglycemia: Secondary | ICD-10-CM

## 2020-08-05 MED ORDER — GLIPIZIDE ER 5 MG PO TB24: 5.0000 mg | ORAL_TABLET | Freq: Every day | ORAL | 3 refills | Status: DC

## 2020-08-16 ENCOUNTER — Ambulatory Visit (INDEPENDENT_AMBULATORY_CARE_PROVIDER_SITE_OTHER): Payer: PPO | Admitting: Pharmacist

## 2020-08-16 DIAGNOSIS — I10 Essential (primary) hypertension: Secondary | ICD-10-CM

## 2020-08-16 DIAGNOSIS — E1165 Type 2 diabetes mellitus with hyperglycemia: Secondary | ICD-10-CM | POA: Diagnosis not present

## 2020-08-16 DIAGNOSIS — Z8673 Personal history of transient ischemic attack (TIA), and cerebral infarction without residual deficits: Secondary | ICD-10-CM

## 2020-08-16 MED ORDER — EMPAGLIFLOZIN 10 MG PO TABS: 10.0000 mg | ORAL_TABLET | Freq: Every day | ORAL | 2 refills | Status: DC

## 2020-08-16 NOTE — Patient Instructions (Addendum)
[name],   It was great to talk to you today!  Glipizide has a risk of causing low blood sugars, so choosing a different medication would be safer option for you.   Let's STOP glipizide and START Jardiance 10 mg daily. Take this with your MORNING medications. Focus on staying well hydrated - this medication can cause you to urinate more often.  Continue taking metformin XR 1000 mg twice daily.   Keep checking your blood sugars, alternating between fasting and then 2 hours after meals. For an A1c of less than 7%, our goal fasting sugar is <130 and goal 2 hour post prandial sugar is <180.   Think about the FreeStyle Libre Continuous Glucose Monitor. This is covered for free by your insurance. There are two parts - a sensor that you wear on your arm (each sensor lasts for 14 days) and a reader that you carry around with you. The sensor checks your glucose for you every 15 minutes, so you can see A LOT of information about how your blood sugars are doing, without fingersticks! We have samples here at the clinic, so if you ever want to try one just to see if you like it, let me know.   Call me with any questions or concerns!  Catie [name], PharmD [phone]  Visit Information  Goals Addressed              This Visit's Progress     Patient Stated   .  "I want to stay healthy" (pt-stated)        CARE PLAN ENTRY (see longtitudinal plan of care for additional care plan information)  Current Barriers:  . Social, financial, or community barriers: o Patient approved for Medicare Extra Help. All generic copays are $3.70/30 or 90 day supply, all brand copays are $9.20/30 or 90 day supply. o Reports that her mother has been sick recently, so she's been helping provide care for her.  . Most recent eGFR 72 mL/min . Diabetes, uncontrolled; complicated by HTN, CAD (CVA in 05/2019); most recent A1c 7.5% . Current antihyperglycemic regimen: metformin XR 1000 mg QAM, glipizide XR 5 mg QAM  (taking 2 2.5 mg tablets to use up current supply) . Current glucose readings- notes that she only has use of 1 hand, so is getting tired of fingersticks to her one hand.  o Fastings: 80-90s o After meals: 110, but not checking often . Cardiovascular risk reduction: o Current hypertensive regimen: carvedilol 25 mg BID; lisinopril 40 mg QAM amlodipine 10 mg QAM; home BP readings 110-120/70-80s o Current hyperlipidemia regimen: atorvastatin 40 mg daily; last LDL >100  o Current antiplatelet regimen: ASA 81 mg daily, clopidogrel 75 mg daily (s/p carotid stent, neurology recommends continued DAPT) . Supplements: Vitamin B12 1000 mcg daily, Vitamin D 1000 units daily   Pharmacist Clinical Goal(s):  Marland Kitchen Over the next 90 days, patient will work with PharmD and primary care provider to address optimized ASCVD risk reduction  Interventions: . Comprehensive medication review performed, medication list updated in electronic medical record . Inter-disciplinary care team collaboration (see longitudinal plan of care) . Discussed goal A1c, goal fasting, and goal 2 hour post prandial glucose readings. 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 Discussed ASCVD, CKD benefits of SGLT2. Patient amenable to change. D/c glipizide, start Jardiance 10 mg daily. She is amenable to  the $9.20 cost. Will start with 1 month supply, but transition to 3 months moving forward once stability on regimen and neccessary dose is established . Praised for maintenance of goal BP. Reviewed importance of goal BP for secondary prevention . Reviewed LDL. Patient's LDL had previously been better controlled on this dose. PCP provided w/ dietary information, will further discuss moving forward and consider maximization of statin therapy.  . Given patient's reluctance to fingersticks,  discussed CGM. Patient's insurance covers [employer] at no charge, regardless of medication regimen. She will think about this and let me know if she would like to try this moving forward  Patient Self Care Activities:  . Patient will check blood glucose daily, document, and provide at future appointments . Patient will take medications as prescribed . Patient will report any questions or concerns to provider   Please see past updates related to this goal by clicking on the "Past Updates" button in the selected goal         The patient verbalized understanding of instructions provided today and agreed to receive a mailed copy of patient instruction and/or educational materials.     Plan:  - Scheduled f/u call in ~ 4 weeks  Catie [name], PharmD, [place], [place] Pharmacist [place] [phone]

## 2020-08-16 NOTE — Chronic Care Management (AMB) (Signed)
Chronic Care Management   Follow Up Note   08/16/2020 Name: Tammy Nunez MRN: [id number] DOB: December 29, 1950  Referred by: McLean-Scocuzza, Nino Glow, MD Reason for referral : Chronic Care Management (Medication Management)   Tammy Nunez is a 69 y.o. year old female who is a primary care patient of McLean-Scocuzza, Nino Glow, MD. The CCM team was consulted for assistance with chronic disease management and care coordination needs.    Contacted patient for medication management review.  Review of patient status, including review of consultants reports, relevant laboratory and other test results, and collaboration with appropriate care team members and the patient's provider was performed as part of comprehensive patient evaluation and provision of chronic care management services.    SDOH (Social Determinants of Health) assessments performed: Yes See Care Plan activities for detailed interventions related to SDOH)  SDOH Interventions     Most Recent Value  SDOH Interventions  Financial Strain Interventions Other (Comment)  [patient approved for extra help]       Outpatient Encounter Medications as of 08/16/2020  Medication Sig Note  . amLODipine (NORVASC) 10 MG tablet Take 1 tablet (10 mg total) by mouth daily.   Marland Kitchen atorvastatin (LIPITOR) 40 MG tablet Take 1 tablet (40 mg total) by mouth daily at 6 PM.   . lisinopril (ZESTRIL) 40 MG tablet Take 1 tablet (40 mg total) by mouth daily. 01/12/2020: QAM  . metFORMIN (GLUCOPHAGE XR) 500 MG 24 hr tablet Take 2 tablets (1,000 mg total) by mouth daily with breakfast.   . potassium chloride SA (KLOR-CON M20) 20 MEQ tablet Take 1 tablet (20 mEq total) by mouth daily.   . vitamin B-12 (CYANOCOBALAMIN) 1000 MCG tablet Take 1 tablet (1,000 mcg total) by mouth daily.   . Vitamin D, Cholecalciferol, 50 MCG (2000 UT) CAPS Take 4,000 Units by mouth.   Marland Kitchen acetaminophen (TYLENOL) 325 MG tablet Take 1-2 tablets (325-650 mg total) by mouth every 4 (four)  hours as needed for mild pain.   Marland Kitchen aspirin EC 81 MG EC tablet Take 1 tablet (81 mg total) by mouth daily.   . blood glucose meter kit and supplies 1 each by Other route 4 (four) times daily. Dispense based on patient and insurance preference. Four times daily as directed. (FOR ICD-10 E10.9, E11.9).   . carvedilol (COREG) 25 MG tablet Take 1 tablet (25 mg total) by mouth 2 (two) times daily with a meal.   . clopidogrel (PLAVIX) 75 MG tablet Take 1 tablet (75 mg total) by mouth daily. Further refills [facility] neurology   . empagliflozin (JARDIANCE) 10 MG TABS tablet Take 1 tablet (10 mg total) by mouth daily before breakfast.   . [DISCONTINUED] glipiZIDE (GLUCOTROL XL) 5 MG 24 hr tablet Take 1 tablet (5 mg total) by mouth daily with breakfast. D/c 2.5    No facility-administered encounter medications on file as of 08/16/2020.     Objective:   Goals Addressed              This Visit's Progress     Patient Stated   .  "I want to stay healthy" (pt-stated)        CARE PLAN ENTRY (see longtitudinal plan of care for additional care plan information)  Current Barriers:  . Social, financial, or community barriers: o Patient approved for Medicare Extra Help. All generic copays are $3.70/30 or 90 day supply, all brand copays are $9.20/30 or 90 day supply. o Reports that her mother has been sick recently, so she's  been helping provide care for her.  . Most recent eGFR 72 mL/min . Diabetes, uncontrolled; complicated by HTN, CAD (CVA in 05/2019); most recent A1c 7.5% . Current antihyperglycemic regimen: metformin XR 1000 mg QAM, glipizide XR 5 mg QAM (taking 2 2.5 mg tablets to use up current supply) . Current glucose readings- notes that she only has use of 1 hand, so is getting tired of fingersticks to her one hand.  o Fastings: 80-90s o After meals: 110, but not checking often . Cardiovascular risk reduction: o Current hypertensive regimen: carvedilol 25 mg BID; lisinopril 40 mg QAM amlodipine 10 mg  QAM; home BP readings 110-120/70-80s o Current hyperlipidemia regimen: atorvastatin 40 mg daily; last LDL >100  o Current antiplatelet regimen: ASA 81 mg daily, clopidogrel 75 mg daily (s/p carotid stent, neurology recommends continued DAPT) . Supplements: Vitamin B12 1000 mcg daily, Vitamin D 1000 units daily   Pharmacist Clinical Goal(s):  Marland Kitchen Over the next 90 days, patient will work with PharmD and primary care provider to address optimized ASCVD risk reduction  Interventions: . Comprehensive medication review performed, medication list updated in electronic medical record . Inter-disciplinary care team collaboration (see longitudinal plan of care) . Discussed goal A1c, goal fasting, and goal 2 hour post prandial glucose readings. 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 Discussed ASCVD, CKD benefits of SGLT2. Patient amenable to change. D/c glipizide, start Jardiance 10 mg daily. She is amenable to the $9.20 cost. Will start with 1 month supply, but transition to 3 months moving forward once stability on regimen and neccessary dose is established . Praised for maintenance of goal BP. Reviewed importance of goal BP for secondary prevention . Reviewed LDL. Patient's LDL had previously been better controlled on this dose. PCP provided w/ dietary information, will further discuss moving forward and consider maximization of statin therapy.  . Given patient's reluctance to fingersticks, discussed CGM. Patient's insurance covers [employer] at no charge, regardless of medication regimen. She will think about this and let me know if she would like to try this moving forward  Patient Self Care Activities:  . Patient will check blood glucose daily, document, and provide at future appointments . Patient will take medications as  prescribed . Patient will report any questions or concerns to provider   Please see past updates related to this goal by clicking on the "Past Updates" button in the selected goal          Plan:  - Scheduled f/u call in ~ 4 weeks  Catie [name], PharmD, [place], [place] Pharmacist [address] [phone]

## 2020-09-13 ENCOUNTER — Ambulatory Visit (INDEPENDENT_AMBULATORY_CARE_PROVIDER_SITE_OTHER): Payer: PPO | Admitting: Pharmacist

## 2020-09-13 DIAGNOSIS — I10 Essential (primary) hypertension: Secondary | ICD-10-CM | POA: Diagnosis not present

## 2020-09-13 DIAGNOSIS — I639 Cerebral infarction, unspecified: Secondary | ICD-10-CM | POA: Diagnosis not present

## 2020-09-13 DIAGNOSIS — E1165 Type 2 diabetes mellitus with hyperglycemia: Secondary | ICD-10-CM

## 2020-09-13 DIAGNOSIS — E785 Hyperlipidemia, unspecified: Secondary | ICD-10-CM | POA: Diagnosis not present

## 2020-09-13 MED ORDER — ATORVASTATIN CALCIUM 80 MG PO TABS: 80.0000 mg | ORAL_TABLET | Freq: Every day | ORAL | 3 refills | Status: DC

## 2020-09-13 NOTE — Chronic Care Management (AMB) (Signed)
Chronic Care Management   Follow Up Note   09/13/2020 Name: Bonniejean Piano MRN: [id number] DOB: 1951/04/14  Referred by: McLean-Scocuzza, Nino Glow, MD Reason for referral : Chronic Care Management (Medication Management)   Josilynn Losh is a 69 y.o. year old female who is a primary care patient of McLean-Scocuzza, Nino Glow, MD. The CCM team was consulted for assistance with chronic disease management and care coordination needs.    Contacted patient for medication management review.   Review of patient status, including review of consultants reports, relevant laboratory and other test results, and collaboration with appropriate care team members and the patient's provider was performed as part of comprehensive patient evaluation and provision of chronic care management services.    SDOH (Social Determinants of Health) assessments performed: Yes See Care Plan activities for detailed interventions related to SDOH)  SDOH Interventions     Most Recent Value  SDOH Interventions  Financial Strain Interventions Intervention Not Indicated  Physical Activity Interventions Intervention Not Indicated       Outpatient Encounter Medications as of 09/13/2020  Medication Sig Note  . amLODipine (NORVASC) 10 MG tablet Take 1 tablet (10 mg total) by mouth daily.   Marland Kitchen aspirin EC 81 MG EC tablet Take 1 tablet (81 mg total) by mouth daily.   Marland Kitchen atorvastatin (LIPITOR) 40 MG tablet Take 1 tablet (40 mg total) by mouth daily at 6 PM.   . blood glucose meter kit and supplies 1 each by Other route 4 (four) times daily. Dispense based on patient and insurance preference. Four times daily as directed. (FOR ICD-10 E10.9, E11.9).   . carvedilol (COREG) 25 MG tablet Take 1 tablet (25 mg total) by mouth 2 (two) times daily with a meal.   . clopidogrel (PLAVIX) 75 MG tablet Take 1 tablet (75 mg total) by mouth daily. Further refills [facility] neurology   . empagliflozin (JARDIANCE) 10 MG TABS tablet Take 1 tablet (10 mg  total) by mouth daily before breakfast.   . lisinopril (ZESTRIL) 40 MG tablet Take 1 tablet (40 mg total) by mouth daily. 01/12/2020: QAM  . metFORMIN (GLUCOPHAGE XR) 500 MG 24 hr tablet Take 2 tablets (1,000 mg total) by mouth daily with breakfast.   . potassium chloride SA (KLOR-CON M20) 20 MEQ tablet Take 1 tablet (20 mEq total) by mouth daily.   . vitamin B-12 (CYANOCOBALAMIN) 1000 MCG tablet Take 1 tablet (1,000 mcg total) by mouth daily.   . Vitamin D, Cholecalciferol, 50 MCG (2000 UT) CAPS Take 4,000 Units by mouth.   Marland Kitchen acetaminophen (TYLENOL) 325 MG tablet Take 1-2 tablets (325-650 mg total) by mouth every 4 (four) hours as needed for mild pain. (Patient not taking: Reported on 09/13/2020)    No facility-administered encounter medications on file as of 09/13/2020.     Objective:   Goals Addressed              This Visit's Progress     Patient Stated   .  "I want to stay healthy" (pt-stated)        CARE PLAN ENTRY (see longtitudinal plan of care for additional care plan information)  Current Barriers:  . Social, financial, or community barriers: o None noted at this time o Patient approved for Medicare Extra Help. All generic copays are $3.70/30 or 90 day supply, all brand copays are $9.20/30 or 90 day supply. . Most recent eGFR 72 mL/min . Diabetes, uncontrolled; complicated by HTN, CAD (CVA in 05/2019); most recent A1c 7.5% .  Current antihyperglycemic regimen: metformin XR 1000 mg QAM, Jardiance 10 mg daily  o Denies any intolerability concerns, does report more urination, but denies any s/sx GU infections . Current glucose readings o Fastings: 80-90s o After meals: 120s . Current meal patterns:  o Breakfast: oatmeal, eggs, coffee sometimes, water most times  o Lunch: tuna sandwich w/ wheat bread; sometimes chicken salad; sometimes apple  o Snacks: generally avoids  o Supper: Baked chicken, green beans/peas, squash, okra; water . Current exercise:  o Walks outside  every day, walks 3 times daily, walks around to the [place], mailbox . Cardiovascular risk reduction: o Current hypertensive regimen: carvedilol 25 mg BID; lisinopril 40 mg QAM, amlodipine 10 mg QAM; home BP readings 110-120/70s o Current hyperlipidemia regimen: atorvastatin 40 mg daily; last LDL >100; though previously was 71 o Current antiplatelet regimen: ASA 81 mg daily, clopidogrel 75 mg daily (s/p carotid stent, neurology recommends continued DAPT) . Supplements: Vitamin B12 1000 mcg daily, Vitamin D 1000 units daily   Pharmacist Clinical Goal(s):  Marland Kitchen Over the next 90 days, patient will work with PharmD and primary care provider to address optimized ASCVD risk reduction  Interventions: . Comprehensive medication review performed, medication list updated in electronic medical record . Inter-disciplinary care team collaboration (see longitudinal plan of care) . Praised for continued maintenance on glycemic regimen. Continue metformin XR 1000 mg QAM and Jardiance 10 mg daily.  . Encouraged continued focus on physical activity and carbohydrate moderation . BP at goal. Continue current regimen . LDL not at goal <70 given hx CVA. Patient denies missed doses of atorvastatin. Increase to 80 mg daily. Patient counseled to take 2 of the 40 mg tablets until she completes supply then increase to 80 mg.  . Patient interested in using CGM. Scheduled face to face co-visit w/ PCP for teaching.   Patient Self Care Activities:  . Patient will check blood glucose daily, document, and provide at future appointments . Patient will take medications as prescribed . Patient will report any questions or concerns to provider   Please see past updates related to this goal by clicking on the "Past Updates" button in the selected goal          Plan:  - Scheduled f/u face to face in ~ 4 weeks  Catie [name], PharmD, [place], [place] Pharmacist [place] [phone]

## 2020-09-13 NOTE — Patient Instructions (Addendum)
Ms. Stonesifer,   It was GREAT talking to you today! Keep up the great work!  Let's increase atorvastatin from 40 mg daily to 80 mg daily to better lower your bad cholesterol. You can take 2 of the 40 mg tablets until you finish that supply. I have sent the 80 mg tablet strength to your pharmacy.   Call me with any questions! I'll see you in November before your appointment with Dr. Olivia Mackie!  Catie [name], PharmD [phone]  Visit Information  Goals Addressed              This Visit's Progress     Patient Stated   .  "I want to stay healthy" (pt-stated)        CARE PLAN ENTRY (see longtitudinal plan of care for additional care plan information)  Current Barriers:  . Social, financial, or community barriers: o None noted at this time o Patient approved for Medicare Extra Help. All generic copays are $3.70/30 or 90 day supply, all brand copays are $9.20/30 or 90 day supply. . Most recent eGFR 72 mL/min . Diabetes, uncontrolled; complicated by HTN, CAD (CVA in 05/2019); most recent A1c 7.5% . Current antihyperglycemic regimen: metformin XR 1000 mg QAM, Jardiance 10 mg daily  o Denies any intolerability concerns, does report more urination, but denies any s/sx GU infections . Current glucose readings o Fastings: 80-90s o After meals: 120s . Current meal patterns:  o Breakfast: oatmeal, eggs, coffee sometimes, water most times  o Lunch: tuna sandwich w/ wheat bread; sometimes chicken salad; sometimes apple  o Snacks: generally avoids  o Supper: Baked chicken, green beans/peas, squash, okra; water . Current exercise:  o Walks outside every day, walks 3 times daily, walks around to the [place], mailbox . Cardiovascular risk reduction: o Current hypertensive regimen: carvedilol 25 mg BID; lisinopril 40 mg QAM, amlodipine 10 mg QAM; home BP readings 110-120/70s o Current hyperlipidemia regimen: atorvastatin 40 mg daily; last LDL >100; though previously was 71 o Current antiplatelet  regimen: ASA 81 mg daily, clopidogrel 75 mg daily (s/p carotid stent, neurology recommends continued DAPT) . Supplements: Vitamin B12 1000 mcg daily, Vitamin D 1000 units daily   Pharmacist Clinical Goal(s):  Marland Kitchen Over the next 90 days, patient will work with PharmD and primary care provider to address optimized ASCVD risk reduction  Interventions: . Comprehensive medication review performed, medication list updated in electronic medical record . Inter-disciplinary care team collaboration (see longitudinal plan of care) . Praised for continued maintenance on glycemic regimen. Continue metformin XR 1000 mg QAM and Jardiance 10 mg daily.  . Encouraged continued focus on physical activity and carbohydrate moderation . BP at goal. Continue current regimen . LDL not at goal <70 given hx CVA. Patient denies missed doses of atorvastatin. Increase to 80 mg daily. Patient counseled to take 2 of the 40 mg tablets until she completes supply then increase to 80 mg.  . Patient interested in using CGM. Scheduled face to face co-visit w/ PCP for teaching.   Patient Self Care Activities:  . Patient will check blood glucose daily, document, and provide at future appointments . Patient will take medications as prescribed . Patient will report any questions or concerns to provider   Please see past updates related to this goal by clicking on the "Past Updates" button in the selected goal         The patient verbalized understanding of instructions provided today and agreed to receive a mailed copy of patient instruction  and/or Scientist, clinical (histocompatibility and immunogenetics).  Plan:  - Scheduled f/u face to face in ~ 4 weeks  Catie [name], PharmD, [place], [place] Pharmacist [place] [phone]

## 2020-09-14 ENCOUNTER — Telehealth: Payer: Self-pay | Admitting: Internal Medicine

## 2020-09-14 NOTE — Telephone Encounter (Signed)
Left message for patient to call back and schedule Medicare Annual Wellness Visit (AWV)  ° °This should be a telephone visit only=30 minutes. ° °No hx of AWV; please schedule at anytime with Denisa O'Brien-Blaney at [facility] ° ° °

## 2020-10-11 ENCOUNTER — Other Ambulatory Visit: Payer: Self-pay

## 2020-10-11 ENCOUNTER — Encounter: Payer: Self-pay | Admitting: Internal Medicine

## 2020-10-11 ENCOUNTER — Ambulatory Visit (INDEPENDENT_AMBULATORY_CARE_PROVIDER_SITE_OTHER): Payer: PPO | Admitting: Internal Medicine

## 2020-10-11 ENCOUNTER — Ambulatory Visit: Payer: PPO | Admitting: Pharmacist

## 2020-10-11 VITALS — BP 118/68 | HR 74 | Temp 98.1°F | Ht 61.0 in | Wt 165.6 lb

## 2020-10-11 DIAGNOSIS — E1169 Type 2 diabetes mellitus with other specified complication: Secondary | ICD-10-CM | POA: Diagnosis not present

## 2020-10-11 DIAGNOSIS — Z8673 Personal history of transient ischemic attack (TIA), and cerebral infarction without residual deficits: Secondary | ICD-10-CM

## 2020-10-11 DIAGNOSIS — I152 Hypertension secondary to endocrine disorders: Secondary | ICD-10-CM | POA: Diagnosis not present

## 2020-10-11 DIAGNOSIS — E119 Type 2 diabetes mellitus without complications: Secondary | ICD-10-CM | POA: Insufficient documentation

## 2020-10-11 DIAGNOSIS — Z1231 Encounter for screening mammogram for malignant neoplasm of breast: Secondary | ICD-10-CM | POA: Diagnosis not present

## 2020-10-11 DIAGNOSIS — I639 Cerebral infarction, unspecified: Secondary | ICD-10-CM

## 2020-10-11 DIAGNOSIS — Z23 Encounter for immunization: Secondary | ICD-10-CM | POA: Diagnosis not present

## 2020-10-11 DIAGNOSIS — E1165 Type 2 diabetes mellitus with hyperglycemia: Secondary | ICD-10-CM

## 2020-10-11 DIAGNOSIS — E669 Obesity, unspecified: Secondary | ICD-10-CM

## 2020-10-11 DIAGNOSIS — E2839 Other primary ovarian failure: Secondary | ICD-10-CM | POA: Diagnosis not present

## 2020-10-11 DIAGNOSIS — I10 Essential (primary) hypertension: Secondary | ICD-10-CM

## 2020-10-11 DIAGNOSIS — E1159 Type 2 diabetes mellitus with other circulatory complications: Secondary | ICD-10-CM | POA: Diagnosis not present

## 2020-10-11 DIAGNOSIS — Z1329 Encounter for screening for other suspected endocrine disorder: Secondary | ICD-10-CM | POA: Diagnosis not present

## 2020-10-11 HISTORY — DX: Obesity, unspecified: E66.9

## 2020-10-11 HISTORY — DX: Type 2 diabetes mellitus with other circulatory complications: E11.59

## 2020-10-11 HISTORY — DX: Type 2 diabetes mellitus with other circulatory complications: E11.69

## 2020-10-11 MED ORDER — FREESTYLE LIBRE 2 SENSOR MISC: 3 refills | Status: DC

## 2020-10-11 NOTE — Addendum Note (Signed)
Addended by: De Hollingshead on: 10/11/2020 12:48 PM   Modules accepted: Orders

## 2020-10-11 NOTE — Patient Instructions (Addendum)
12/20/20 Dr. Graciella Belton call to schedule eye appointment  Bring back in covid 19 card  Pneumococcal Conjugate Vaccine suspension for injection What is this medicine? PNEUMOCOCCAL VACCINE (NEU mo KOK al vak SEEN) is a vaccine used to prevent pneumococcus bacterial infections. These bacteria can cause serious infections like pneumonia, meningitis, and blood infections. This vaccine will lower your chance of getting pneumonia. If you do get pneumonia, it can make your symptoms milder and your illness shorter. This vaccine will not treat an infection and will not cause infection. This vaccine is recommended for infants and young children, adults with certain medical conditions, and adults 46 years or older. This medicine may be used for other purposes; ask your health care provider or pharmacist if you have questions. COMMON BRAND NAME(S): Prevnar, Prevnar 13 What should I tell my health care provider before I take this medicine? They need to know if you have any of these conditions:  bleeding problems  fever  immune system problems  an unusual or allergic reaction to pneumococcal vaccine, diphtheria toxoid, other vaccines, latex, other medicines, foods, dyes, or preservatives  pregnant or trying to get pregnant  breast-feeding How should I use this medicine? This vaccine is for injection into a muscle. It is given by a health care professional. A copy of Vaccine Information Statements will be given before each vaccination. Read this sheet carefully each time. The sheet may change frequently. Talk to your pediatrician regarding the use of this medicine in children. While this drug may be prescribed for children as young as 20 weeks old for selected conditions, precautions do apply. Overdosage: If you think you have taken too much of this medicine contact a poison control center or emergency room at once. NOTE: This medicine is only for you. Do not share this medicine with others. What if I  miss a dose? It is important not to miss your dose. Call your doctor or health care professional if you are unable to keep an appointment. What may interact with this medicine?  medicines for cancer chemotherapy  medicines that suppress your immune function  steroid medicines like prednisone or cortisone This list may not describe all possible interactions. Give your health care provider a list of all the medicines, herbs, non-prescription drugs, or dietary supplements you use. Also tell them if you smoke, drink alcohol, or use illegal drugs. Some items may interact with your medicine. What should I watch for while using this medicine? Mild fever and pain should go away in 3 days or less. Report any unusual symptoms to your doctor or health care professional. What side effects may I notice from receiving this medicine? Side effects that you should report to your doctor or health care professional as soon as possible:  allergic reactions like skin rash, itching or hives, swelling of the face, lips, or tongue  breathing problems  confused  fast or irregular heartbeat  fever over 102 degrees F  seizures  unusual bleeding or bruising  unusual muscle weakness Side effects that usually do not require medical attention (report to your doctor or health care professional if they continue or are bothersome):  aches and pains  diarrhea  fever of 102 degrees F or less  headache  irritable  loss of appetite  pain, tender at site where injected  trouble sleeping This list may not describe all possible side effects. Call your doctor for medical advice about side effects. You may report side effects to FDA at 1-800-FDA-1088. Where should I  keep my medicine? This does not apply. This vaccine is given in a clinic, pharmacy, doctor's office, or other health care setting and will not be stored at home. NOTE: This sheet is a summary. It may not cover all possible information. If you have  questions about this medicine, talk to your doctor, pharmacist, or health care provider.  2020 Elsevier/Gold Standard (2014-08-17 10:27:27)

## 2020-10-11 NOTE — Patient Instructions (Signed)
Visit Information  Patient Care Plan: Medication Management    Problem Identified: Diabetes, Hx CVA, HTN, HLD     Long-Range Goal: Disease State Management   Note:   Current Barriers:  . Unable to independently monitor therapeutic efficacy . Unable to achieve control of cardiovascular risk reduction   Pharmacist Clinical Goal(s):  Marland Kitchen Over the next 90 days, patient will achieve adherence to monitoring guidelines and medication adherence to achieve therapeutic efficacy. . Over the next 90 days, achieve control of diabetes as evidenced by improvement in A1c through collaboration with PharmD and provider.   Interventions: . Inter-disciplinary care team collaboration (see longitudinal plan of care) . Comprehensive medication review performed; medication list updated in electronic medical record  Diabetes: . Uncontrolled but improved per SMBG;  current treatment: metformin XR 1000 mg QAM, Jardiance 10 mg daily  . Current glucose readings: notes that she does not check often at home because she doesn't like pricking her finger . Recommended to continue current regimen.   If A1c not at goal, recommend increasing Jardiance to 25 mg daily . Educated on CGM. Provided sample, coached on placement. Script for sensors sent to her pharmacy.  Hypertension: . Controlled; current treatment: carvedilol 25 mg BID; lisinopril 40 mg QAM, amlodipine 10 mg QAM . Current home readings: 110-120s/70s . Denies/reports hypotensive/hypertensive symptoms . Recommended to continue current regimen  Hyperlipidemia, secondary ASCVD risk reduction . Uncontrolled but likely improved d/t medication change; current treatment: atorvastatin 80 mg daily . Antiplatelet therapy: aspirin 81 mg daily, clopidogrel 75 mg daily   . Recommended to continue current regimen. Lipid panel today    Patient Goals/Self-Care Activities . Over the next 90 days, patient will:  - take medications as prescribed check blood glucose using  CGM, document, and provide at future appointments  Follow Up Plan: Face to Face appointment with care management team member scheduled for:  ~ 8 weeks         The patient verbalized understanding of instructions, educational materials, and care plan provided today and declined offer to receive copy of patient instructions, educational materials, and care plan.   Plan: Face to Face appointment with care management team member scheduled for: ~ 6 weeks  Catie [name], PharmD, [place], [place] Pharmacist [place] [phone]

## 2020-10-11 NOTE — Progress Notes (Signed)
Chief Complaint  Patient presents with   Follow-up   F/u  1. DM 2 with HTN A1C 7.5 08/02/20 on jardiance 17m qd norvasc 10, lipitor 80, coreg 25 mg bid, plavix 75 mg qd lis 40 mg qd metformin xr 500 (1000) qam  Doing well BP controlled DM2 improved  2. S/p stroke with left hemiparesis left arm >left leg and wants to get on disability been discussing with SS office will CC Dr. PMelrose Nakayamato see if he writes disability    Review of Systems  Constitutional: Negative for weight loss.  HENT: Negative for hearing loss.   Eyes: Negative for blurred vision.  Respiratory: Negative for shortness of breath.   Cardiovascular: Negative for chest pain.  Gastrointestinal: Negative for abdominal pain.  Musculoskeletal: Negative for falls and joint pain.  Skin: Negative for rash.  Neurological: Positive for weakness. Negative for headaches.  Psychiatric/Behavioral: Negative for depression.   Past Medical History:  Diagnosis Date   Chronic left shoulder pain    Diabetes mellitus without complication (HCC)    Hypertension    [place]    weakness left upper amd lower extremity   Stroke ([facility]    Past Surgical History:  Procedure Laterality Date   CAROTID PTA/STENT INTERVENTION Left 07/20/2019   Procedure: CAROTID PTA/STENT INTERVENTION;  Surgeon: SKatha Cabal MD;  [facility];  Service: Cardiovascular;  Laterality: Left;   ECTOPIC PREGNANCY SURGERY     Family History  Problem Relation Age of Onset   Diabetes type II Mother    Hypertension Father    Heart attack Father    Diabetes type II Brother    Social History   Socioeconomic History   Marital status: Married    Spouse name: Not on file   Number of children: Not on file   Years of education: Not on file   Highest education level: Not on file  Occupational History   Not on file  Tobacco Use   Smoking status: Former Smoker    Packs/day: 0.50    Years: 15.00    Pack years: 7.50     Types: Cigarettes    Quit date: 04/11/2019    Years since quitting: 1.5   Smokeless tobacco: Never Used  Vaping Use   Vaping Use: Never used  Substance and Sexual Activity   Alcohol use: Never   Drug use: Never   Sexual activity: Not Currently  Other Topics Concern   Not on file  Social History Narrative   No kids    Married with husband    Used to work [employer] broadcast assistant   Social Determinants of HRadio broadcast assistantStrain: Low Risk    Difficulty of Paying Living Expenses: Not hard at all  Food Insecurity:    Worried About RCharity fundraiserin the Last Year: Not on file   RHugotonin the Last Year: Not on file  Transportation Needs:    Lack of Transportation (Medical): Not on file   Lack of Transportation (Non-Medical): Not on file  Physical Activity: Sufficiently Active   Days of Exercise per Week: 7 days   Minutes of Exercise per Session: 30 min  Stress:    Feeling of Stress : Not on file  Social Connections:    Frequency of Communication with Friends and Family: Not on file   Frequency of Social Gatherings with Friends and Family: Not on file   Attends Religious Services: Not on  file   Active Member of Clubs or Organizations: Not on file   Attends Archivist Meetings: Not on file   Marital Status: Not on file  Intimate Partner Violence:    Fear of Current or Ex-Partner: Not on file   Emotionally Abused: Not on file   Physically Abused: Not on file   Sexually Abused: Not on file   Current Meds  Medication Sig   amLODipine (NORVASC) 10 MG tablet Take 1 tablet (10 mg total) by mouth daily.   aspirin EC 81 MG EC tablet Take 1 tablet (81 mg total) by mouth daily.   atorvastatin (LIPITOR) 80 MG tablet Take 1 tablet (80 mg total) by mouth daily.   blood glucose meter kit and supplies 1 each by Other route 4 (four) times daily. Dispense based on patient and insurance preference. Four times daily as  directed. (FOR ICD-10 E10.9, E11.9).   carvedilol (COREG) 25 MG tablet Take 1 tablet (25 mg total) by mouth 2 (two) times daily with a meal.   clopidogrel (PLAVIX) 75 MG tablet Take 1 tablet (75 mg total) by mouth daily. Further refills [facility] neurology   empagliflozin (JARDIANCE) 10 MG TABS tablet Take 1 tablet (10 mg total) by mouth daily before breakfast.   lisinopril (ZESTRIL) 40 MG tablet Take 1 tablet (40 mg total) by mouth daily.   metFORMIN (GLUCOPHAGE XR) 500 MG 24 hr tablet Take 2 tablets (1,000 mg total) by mouth daily with breakfast.   potassium chloride SA (KLOR-CON M20) 20 MEQ tablet Take 1 tablet (20 mEq total) by mouth daily.   vitamin B-12 (CYANOCOBALAMIN) 1000 MCG tablet Take 1 tablet (1,000 mcg total) by mouth daily.   Vitamin D, Cholecalciferol, 50 MCG (2000 UT) CAPS Take 4,000 Units by mouth.   No Known Allergies Recent Results (from the past 2160 hour(s))  Vitamin D (25 hydroxy)     Status: None   Collection Time: 08/02/20  9:47 AM  Result Value Ref Range   VITD 65.22 30.00 - 100.00 ng/mL  Hemoglobin A1c     Status: Abnormal   Collection Time: 08/02/20  9:47 AM  Result Value Ref Range   Hgb A1c MFr Bld 7.5 (H) 4.6 - 6.5 %    Comment: Glycemic Control Guidelines for People with Diabetes:Non Diabetic:  <6%Goal of Therapy: <7%Additional Action Suggested:  >8%   Lipid panel     Status: Abnormal   Collection Time: 08/02/20  9:47 AM  Result Value Ref Range   Cholesterol 167 0 - 200 mg/dL    Comment: ATP III Classification       Desirable:  < 200 mg/dL               Borderline High:  200 - 239 mg/dL          High:  > = 240 mg/dL   Triglycerides 65.0 0 - 149 mg/dL    Comment: Normal:  <150 mg/dLBorderline High:  150 - 199 mg/dL   HDL 49.90 >39.00 mg/dL   VLDL 13.0 0.0 - 40.0 mg/dL   LDL Cholesterol 104 (H) 0 - 99 mg/dL   Total CHOL/HDL Ratio 3     Comment:                Men          Women1/2 Average Risk     3.4          3.3Average Risk          5.0  4.42X  Average Risk          9.6          7.13X Average Risk          15.0          11.0                       NonHDL 117.10     Comment: NOTE:  Non-HDL goal should be 30 mg/dL higher than patient's LDL goal (i.e. LDL goal of < 70 mg/dL, would have non-HDL goal of < 100 mg/dL)  CBC w/Diff     Status: None   Collection Time: 08/02/20  9:47 AM  Result Value Ref Range   WBC 6.6 4.0 - 10.5 K/uL   RBC 4.18 3.87 - 5.11 Mil/uL   Hemoglobin 13.2 12.0 - 15.0 g/dL   HCT 39.1 36 - 46 %   MCV 93.6 78.0 - 100.0 fl   MCHC 33.7 30.0 - 36.0 g/dL   RDW 13.9 11.5 - 15.5 %   Platelets 179.0 150 - 400 K/uL   Neutrophils Relative % 57.1 43 - 77 %   Lymphocytes Relative 34.1 12 - 46 %   Monocytes Relative 6.6 3 - 12 %   Eosinophils Relative 1.4 0 - 5 %   Basophils Relative 0.8 0 - 3 %   Neutro Abs 3.8 1.4 - 7.7 K/uL   Lymphs Abs 2.3 0.7 - 4.0 K/uL   Monocytes Absolute 0.4 0.1 - 1.0 K/uL   Eosinophils Absolute 0.1 0.0 - 0.7 K/uL   Basophils Absolute 0.1 0.0 - 0.1 K/uL  Comprehensive metabolic panel     Status: Abnormal   Collection Time: 08/02/20  9:47 AM  Result Value Ref Range   Sodium 139 135 - 145 mEq/L   Potassium 4.4 3.5 - 5.1 mEq/L   Chloride 104 96 - 112 mEq/L   CO2 25 19 - 32 mEq/L   Glucose, Bld 179 (H) 70 - 99 mg/dL   BUN 14 6 - 23 mg/dL   Creatinine, Ser 0.93 0.40 - 1.20 mg/dL   Total Bilirubin 0.4 0.2 - 1.2 mg/dL   Alkaline Phosphatase 60 39 - 117 U/L   AST 12 0 - 37 U/L   ALT 12 0 - 35 U/L   Total Protein 7.2 6.0 - 8.3 g/dL   Albumin 4.8 3.5 - 5.2 g/dL   GFR 72.22 >60.00 mL/min   Calcium 10.0 8.4 - 10.5 mg/dL   Objective  Body mass index is 31.29 kg/m. Wt Readings from Last 3 Encounters:  10/11/20 165 lb 9.6 oz (75.1 kg)  07/10/20 161 lb 9.6 oz (73.3 kg)  02/15/20 161 lb 9.6 oz (73.3 kg)   Temp Readings from Last 3 Encounters:  10/11/20 98.1 F (36.7 C) (Oral)  02/15/20 (!) 97 F (36.1 C) (Temporal)  09/12/19 98.3 F (36.8 C)   BP Readings from Last 3 Encounters:  10/11/20  118/68  07/10/20 111/66  04/17/20 (!) 153/73   Pulse Readings from Last 3 Encounters:  10/11/20 74  04/17/20 70  02/15/20 85    Physical Exam Vitals and nursing note reviewed.  Constitutional:      Appearance: Normal appearance. She is well-developed and well-groomed. She is obese.  HENT:     Head: Normocephalic and atraumatic.  Eyes:     Conjunctiva/sclera: Conjunctivae normal.     Pupils: Pupils are equal, round, and reactive to light.  Cardiovascular:     Rate and  Rhythm: Normal rate and regular rhythm.     Heart sounds: Normal heart sounds. No murmur heard.   Pulmonary:     Breath sounds: Normal breath sounds.  Skin:    General: Skin is warm and dry.  Neurological:     General: No focal deficit present.     Mental Status: She is alert and oriented to person, place, and time. Mental status is at baseline.     Gait: Gait is intact.     Comments: BL walks with cane  3+/5 strength LUE and 4-/4 LLE s/p stroke and left hand contracture    Psychiatric:        Attention and Perception: Attention and perception normal.        Mood and Affect: Mood and affect normal.        Speech: Speech normal.        Behavior: Behavior normal. Behavior is cooperative.        Thought Content: Thought content normal.        Cognition and Memory: Cognition and memory normal.        Judgment: Judgment normal.     Assessment  Plan  Hypertension associated with diabetes ([place]) Obesity, diabetes, and hypertension syndrome (HCC) jardiance 30m qd norvasc 10, lipitor 80, coreg 25 mg bid, plavix 75 mg qd lis 40 mg qd metformin xr 500 (1000) qam  BP controlled working on A1C down   History of stroke with left hemiparesis  Will see if Dr. PMelrose Nakayamawill do disability  Risk factor control  HM Flu shot declines 2019 had  Tdapdisc in future for now declines all vaccines as of 11/30/19 shingrix  prevnar given today 10/11/20 pna 23due in 1 year  covid vx had 2/2 [facility]  Mammogram 03/2019 [place] per pt due 03/2020, ordered [place] &DEXA pt to call and sch disc today  Pap out of age window no h/o abnormal  Colonoscopy remotely ?In 1980s per ptRecord consider in futurevs cologuard No FH colon cancer   cologuard 10/11/20 discussed this wants to wait 11/2020 to do will do order today 10/11/20  rec D3 4000 IU daily + MVT   Dr. PMelrose Nakayama5/2021neurology Provider: Dr. TOlivia MackieMcLean-Scocuzza-Internal Medicine

## 2020-10-11 NOTE — Chronic Care Management (AMB) (Signed)
Chronic Care Management   Pharmacy Note  10/11/2020 Name: Tammy Nunez MRN: [id number] DOB: 29-Mar-1951   Subjective:  Tammy Nunez is a 69 y.o. year old female who is a primary care patient of McLean-Scocuzza, Nino Glow, MD. The CCM team was consulted for assistance with chronic disease management and care coordination needs.    Engaged with patient face to face for follow up visit in response to provider referral for pharmacy case management and/or care coordination services.   Consent to Services:  @M @ @LNAME @ was given information about Chronic Care Management services, agreed to services, and gave verbal consent prior to initiation of services on 08/2019. Please see initial visit note for detailed documentation.   Review of patient status, including review of consultants reports, laboratory and other test data, was performed as part of comprehensive evaluation and provision of chronic care management services.   SDOH (Social Determinants of Health) assessments and interventions performed:    Objective:  Lab Results  Component Value Date   CREATININE 0.93 08/02/2020   CREATININE 0.91 12/08/2019   CREATININE 0.84 07/28/2019    Lab Results  Component Value Date   HGBA1C 7.5 (H) 08/02/2020       Component Value Date/Time   CHOL 167 08/02/2020 0947   TRIG 65.0 08/02/2020 0947   HDL 49.90 08/02/2020 0947   CHOLHDL 3 08/02/2020 0947   VLDL 13.0 08/02/2020 0947   LDLCALC 104 (H) 08/02/2020 0947    BP Readings from Last 3 Encounters:  10/11/20 118/68  07/10/20 111/66  04/17/20 (!) 153/73    Assessment:   No Known Allergies  Medications Reviewed Today    Reviewed by Thressa Sheller, CMA (Certified Medical Assistant) on 10/11/20 at 32  Med List Status: <None>  Medication Order Taking? Sig Documenting Provider Last Dose Status Informant  acetaminophen (TYLENOL) 325 MG tablet [id number] No Take 1-2 tablets (325-650 mg total) by mouth every 4 (four) hours as  needed for mild pain.  Patient not taking: Reported on 10/11/2020   Bary Leriche, PA-C Not Taking Active Pharmacy Records  amLODipine [facility]) 10 MG tablet [id number] Yes Take 1 tablet (10 mg total) by mouth daily. McLean-Scocuzza, Nino Glow, MD Taking Active   aspirin EC 81 MG EC tablet [id number] Yes Take 1 tablet (81 mg total) by mouth daily. Hillary Bow, MD Taking Active Pharmacy Records  atorvastatin (LIPITOR) 80 MG tablet [id number] Yes Take 1 tablet (80 mg total) by mouth daily. McLean-Scocuzza, Nino Glow, MD Taking Active   blood glucose meter kit and supplies [id number] Yes 1 each by Other route 4 (four) times daily. Dispense based on patient and insurance preference. Four times daily as directed. (FOR ICD-10 E10.9, E11.9). [name], FNP Taking Active   carvedilol (COREG) 25 MG tablet [id number] Yes Take 1 tablet (25 mg total) by mouth 2 (two) times daily with a meal. McLean-Scocuzza, Nino Glow, MD Taking Active   clopidogrel (PLAVIX) 75 MG tablet [id number] Yes Take 1 tablet (75 mg total) by mouth daily. Further refills [facility] neurology McLean-Scocuzza, Nino Glow, MD Taking Active   empagliflozin (JARDIANCE) 10 MG TABS tablet [id number] Yes Take 1 tablet (10 mg total) by mouth daily before breakfast. McLean-Scocuzza, Nino Glow, MD Taking Active   lisinopril (ZESTRIL) 40 MG tablet [id number] Yes Take 1 tablet (40 mg total) by mouth daily. McLean-Scocuzza, Nino Glow, MD Taking Active            Med Note Nat Christen Jan 12, 2020 10:09  AM) QAM  metFORMIN (GLUCOPHAGE XR) 500 MG 24 hr tablet [id number] Yes Take 2 tablets (1,000 mg total) by mouth daily with breakfast. McLean-Scocuzza, Nino Glow, MD Taking Active   potassium chloride SA (KLOR-CON M20) 20 MEQ tablet [id number] Yes Take 1 tablet (20 mEq total) by mouth daily. McLean-Scocuzza, Nino Glow, MD Taking Active   vitamin B-12 (CYANOCOBALAMIN) 1000 MCG tablet [id number] Yes Take 1 tablet (1,000 mcg total) by mouth daily. Flora Lipps  Taking Active Pharmacy Records  Vitamin D, Cholecalciferol, 50 MCG (2000 UT) CAPS [id number] Yes Take 4,000 Units by mouth. [provider] Taking Active   Med List Note Dorette Grate, RN 07/20/19 1443): Pt asked me to review meds with husband but he left. She said she did not take any meds today.           Patient Active Problem List   Diagnosis Date Noted  . Obesity, diabetes, and hypertension syndrome ([place]) 10/11/2020  . Hypertension associated with diabetes ([place]) 10/11/2020  . Cataract of both eyes 12/28/2019  . Vitamin D deficiency 12/11/2019  . History of stroke 09/07/2019  . Left hemiparesis ([place]) 09/07/2019  . Carotid stenosis, symptomatic, with infarction ([place]) 07/20/2019  . Type 2 diabetes mellitus with hyperglycemia, without long-term current use of insulin ([place])   . AKI (acute kidney injury) ([place])   . Essential hypertension   . Seizures ([place])   . New onset type 2 diabetes mellitus ([place])     Medication Assistance: None required. Patient affirms current coverage meets needs. Patient has Medicare Extra Help  Patient Care Plan: Medication Management    Problem Identified: Diabetes, Hx CVA, HTN, HLD     Long-Range Goal: Disease State Management   Note:   Current Barriers:  . Unable to independently monitor therapeutic efficacy . Unable to achieve control of cardiovascular risk reduction   Pharmacist Clinical Goal(s):  Marland Kitchen Over the next 90 days, patient will achieve adherence to monitoring guidelines and medication adherence to achieve therapeutic efficacy. . Over the next 90 days, achieve control of diabetes as evidenced by improvement in A1c through collaboration with PharmD and provider.   Interventions: . Inter-disciplinary care team collaboration (see longitudinal plan of care) . Comprehensive medication review performed; medication list updated in electronic medical record  Diabetes: . Uncontrolled but improved per SMBG;  current treatment:  metformin XR 1000 mg QAM, Jardiance 10 mg daily  . Current glucose readings: notes that she does not check often at home because she doesn't like pricking her finger . Recommended to continue current regimen.   If A1c not at goal, recommend increasing Jardiance to 25 mg daily . Educated on CGM. Provided sample, coached on placement. Script for sensors sent to her pharmacy.  Hypertension: . Controlled; current treatment: carvedilol 25 mg BID; lisinopril 40 mg QAM, amlodipine 10 mg QAM . Current home readings: 110-120s/70s . Denies/reports hypotensive/hypertensive symptoms . Recommended to continue current regimen  Hyperlipidemia, secondary ASCVD risk reduction . Uncontrolled but likely improved d/t medication change; current treatment: atorvastatin 80 mg daily . Antiplatelet therapy: aspirin 81 mg daily, clopidogrel 75 mg daily   . Recommended to continue current regimen. Lipid panel today    Patient Goals/Self-Care Activities . Over the next 90 days, patient will:  - take medications as prescribed check blood glucose using CGM, document, and provide at future appointments  Follow Up Plan: Face to Face appointment with care management team member scheduled for:  ~ 8 weeks  Plan: Face to Face appointment with care management team member scheduled for: ~ 6 weeks  Catie [name], PharmD, [place], [place] Pharmacist [place] [phone]

## 2020-10-16 ENCOUNTER — Telehealth: Payer: Self-pay | Admitting: Internal Medicine

## 2020-10-16 NOTE — Telephone Encounter (Signed)
Cologuard ordered placed through [employer] epic portal.

## 2020-10-17 ENCOUNTER — Telehealth: Payer: Self-pay | Admitting: Internal Medicine

## 2020-10-17 NOTE — Telephone Encounter (Signed)
Faxed to [facility] Neurologist doctor notes faxed on 12-17-2019

## 2020-11-07 ENCOUNTER — Encounter: Payer: Self-pay | Admitting: Internal Medicine

## 2020-11-07 ENCOUNTER — Telehealth: Payer: Self-pay

## 2020-11-07 ENCOUNTER — Ambulatory Visit: Payer: PPO | Admitting: Pharmacist

## 2020-11-07 DIAGNOSIS — G8194 Hemiplegia, unspecified affecting left nondominant side: Secondary | ICD-10-CM

## 2020-11-07 DIAGNOSIS — E1165 Type 2 diabetes mellitus with hyperglycemia: Secondary | ICD-10-CM

## 2020-11-07 DIAGNOSIS — Z8673 Personal history of transient ischemic attack (TIA), and cerebral infarction without residual deficits: Secondary | ICD-10-CM

## 2020-11-07 DIAGNOSIS — I10 Essential (primary) hypertension: Secondary | ICD-10-CM

## 2020-11-07 MED ORDER — EMPAGLIFLOZIN 10 MG PO TABS: 10.0000 mg | ORAL_TABLET | Freq: Every day | ORAL | 3 refills | Status: DC

## 2020-11-07 MED ORDER — LISINOPRIL 40 MG PO TABS: 40.0000 mg | ORAL_TABLET | Freq: Every day | ORAL | 3 refills | Status: DC

## 2020-11-07 NOTE — Telephone Encounter (Signed)
Contacted patient's husband. See CCM documentation

## 2020-11-07 NOTE — Chronic Care Management (AMB) (Signed)
Chronic Care Management   Pharmacy Note  11/07/2020 Name: Tammy Nunez MRN: [id number] DOB: 1951-04-20  Subjective:  Tammy Nunez is a 69 y.o. year old female who is a primary care patient of McLean-Scocuzza, Nino Glow, MD. The CCM team was consulted for assistance with chronic disease management and care coordination needs.    Engaged with patient's husband by telephone for a return call in response to provider referral for pharmacy case management and/or care coordination services.   Consent to Services:  Tammy Nunez was given information about Chronic Care Management services, agreed to services, and gave verbal consent prior to initiation of services on 09/22/2019. Please see initial visit note for detailed documentation.   Objective:  Lab Results  Component Value Date   CREATININE 0.93 08/02/2020   CREATININE 0.91 12/08/2019   CREATININE 0.84 07/28/2019    Lab Results  Component Value Date   HGBA1C 7.5 (H) 08/02/2020       Component Value Date/Time   CHOL 167 08/02/2020 0947   TRIG 65.0 08/02/2020 0947   HDL 49.90 08/02/2020 0947   CHOLHDL 3 08/02/2020 0947   VLDL 13.0 08/02/2020 0947   LDLCALC 104 (H) 08/02/2020 0947    BP Readings from Last 3 Encounters:  10/11/20 118/68  07/10/20 111/66  04/17/20 (!) 153/73    Assessment/Interventions: Review of patient past medical history, allergies, medications, health status, including review of consultants reports, laboratory and other test data, was performed as part of comprehensive evaluation and provision of chronic care management services.   SDOH (Social Determinants of Health) assessments and interventions performed:  SDOH Interventions   Flowsheet Row Most Recent Value  SDOH Interventions   Financial Strain Interventions Intervention Not Indicated       CCM Care Plan  No Known Allergies  Medications Reviewed Today    Reviewed by Thressa Sheller, CMA (Certified Medical Assistant) on 10/11/20 at  64  Med List Status: <None>  Medication Order Taking? Sig Documenting Provider Last Dose Status Informant  acetaminophen (TYLENOL) 325 MG tablet [id number] No Take 1-2 tablets (325-650 mg total) by mouth every 4 (four) hours as needed for mild pain.  Patient not taking: Reported on 10/11/2020   Bary Leriche, PA-C Not Taking Active Pharmacy Records  amLODipine [facility]) 10 MG tablet [id number] Yes Take 1 tablet (10 mg total) by mouth daily. McLean-Scocuzza, Nino Glow, MD Taking Active   aspirin EC 81 MG EC tablet [id number] Yes Take 1 tablet (81 mg total) by mouth daily. Hillary Bow, MD Taking Active Pharmacy Records  atorvastatin (LIPITOR) 80 MG tablet [id number] Yes Take 1 tablet (80 mg total) by mouth daily. McLean-Scocuzza, Nino Glow, MD Taking Active   blood glucose meter kit and supplies [id number] Yes 1 each by Other route 4 (four) times daily. Dispense based on patient and insurance preference. Four times daily as directed. (FOR ICD-10 E10.9, E11.9). [name], FNP Taking Active   carvedilol (COREG) 25 MG tablet [id number] Yes Take 1 tablet (25 mg total) by mouth 2 (two) times daily with a meal. McLean-Scocuzza, Nino Glow, MD Taking Active   clopidogrel (PLAVIX) 75 MG tablet [id number] Yes Take 1 tablet (75 mg total) by mouth daily. Further refills [facility] neurology McLean-Scocuzza, Nino Glow, MD Taking Active   empagliflozin (JARDIANCE) 10 MG TABS tablet [id number] Yes Take 1 tablet (10 mg total) by mouth daily before breakfast. McLean-Scocuzza, Nino Glow, MD Taking Active   lisinopril (ZESTRIL) 40 MG tablet [id number] Yes Take 1 tablet (40 mg total) by  mouth daily. McLean-Scocuzza, Nino Glow, MD Taking Active            Med Note De [place]ings[place]   Thu Jan 12, 2020 10:09 AM) QAM  metFORMIN (GLUCOPHAGE XR) 500 MG 24 hr tablet [id number] Yes Take 2 tablets (1,000 mg total) by mouth daily with breakfast. McLean-Scocuzza, Nino Glow, MD Taking Active   potassium chloride SA (KLOR-CON M20) 20 MEQ tablet  [id number] Yes Take 1 tablet (20 mEq total) by mouth daily. McLean-Scocuzza, Nino Glow, MD Taking Active   vitamin B-12 (CYANOCOBALAMIN) 1000 MCG tablet [id number] Yes Take 1 tablet (1,000 mcg total) by mouth daily. Flora Lipps Taking Active Pharmacy Records  Vitamin D, Cholecalciferol, 50 MCG (2000 UT) CAPS [id number] Yes Take 4,000 Units by mouth. [provider] Taking Active   Med List Note Dorette Grate, RN 07/20/19 2706): Pt asked me to review meds with husband but he left. She said she did not take any meds today.           Patient Active Problem List   Diagnosis Date Noted   Obesity, diabetes, and hypertension syndrome ([place]) 10/11/2020   Hypertension associated with diabetes ([place]) 10/11/2020   Cataract of both eyes 12/28/2019   Vitamin D deficiency 12/11/2019   History of stroke 09/07/2019   Left hemiparesis ([place]) 09/07/2019   Carotid stenosis, symptomatic, with infarction ([place]) 07/20/2019   Type 2 diabetes mellitus with hyperglycemia, without long-term current use of insulin (H[facility])    AKI (acute kidney injury) ([place])    Essential hypertension    Seizures ([place])    New onset type 2 diabetes mellitus ([place])     Conditions to be addressed/monitored per PCP order: CAD, HTN and DMII  Patient Care Plan: Medication Management    Problem Identified: Diabetes, Hx CVA, HTN, HLD     Long-Range Goal: Disease State Management   Note:   Current Barriers:   Unable to independently monitor therapeutic efficacy  Unable to achieve control of cardiovascular risk reduction   Pharmacist Clinical Goal(s):   Over the next 90 days, patient will achieve adherence to monitoring guidelines and medication adherence to achieve therapeutic efficacy.  Over the next 90 days, achieve control of diabetes as evidenced by improvement in A1c through collaboration with PharmD and provider.   Interventions:  Inter-disciplinary care team collaboration (see longitudinal  plan of care)  Comprehensive medication review performed; medication list updated in electronic medical record  Diabetes:  Uncontrolled but improved per SMBG;  current treatment: metformin XR 1000 mg QAM, Jardiance 10 mg daily   Current glucose readings: notes that she does not check often at home because she doesn't like pricking her finger  Pharmacy filled glipizide, and patient's husband was calling to see if she was supposed to be taking that, as he had not been using it to fill her pill box.   Reviewed current regimen. He will tell the pharmacy to stop filling glipizide. He requested a 90 day supply refill on Jardiance; I provided that.   Patient needs to be scheduled for f/u fasting lab work (mid 11/2020) and f/u appt w/ PCP (~3-03/2021) w/ PCP. Will collaborate with office staff to schedule this.  Hypertension:  Controlled; current treatment: carvedilol 25 mg BID; lisinopril 40 mg QAM, amlodipine 10 mg QAM  Request 90 day supply refill on lisinopril. Sent today.  Recommended to continue current regimen  Hyperlipidemia, secondary ASCVD risk reduction  Uncontrolled but likely improved d/t medication change; current treatment: atorvastatin 80 mg  daily  Antiplatelet therapy: aspirin 81 mg daily, clopidogrel 75 mg daily. Patient requests refill on clopidogrel  Encouraged to report to the pharmacy that a refill request for clopidogrel (last filled by neurology) be [facility] to [facility]. He notes he will do that.   Recommended to continue current regimen.    Patient Goals/Self-Care Activities  Over the next 90 days, patient will:  - take medications as prescribed check blood glucose using CGM, document, and provide at future appointments  Follow Up Plan: Face to Face appointment with care management team member scheduled for:  ~ 3 weeks         Medication Assistance: None required. Patient affirms current coverage meets needs.   Plan: Face to Face appointment with care  management team member scheduled for: ~ 3 weeks  Catie [name], PharmD, [place], [place] Clinical Pharmacist [employer] at [employer] [phone]

## 2020-11-07 NOTE — Patient Instructions (Signed)
Visit Information  Patient Care Plan: Medication Management    Problem Identified: Diabetes, Hx CVA, HTN, HLD     Long-Range Goal: Disease State Management   Note:   Current Barriers:  . Unable to independently monitor therapeutic efficacy . Unable to achieve control of cardiovascular risk reduction   Pharmacist Clinical Goal(s):  Marland Kitchen Over the next 90 days, patient will achieve adherence to monitoring guidelines and medication adherence to achieve therapeutic efficacy. . Over the next 90 days, achieve control of diabetes as evidenced by improvement in A1c through collaboration with PharmD and provider.   Interventions: . Inter-disciplinary care team collaboration (see longitudinal plan of care) . Comprehensive medication review performed; medication list updated in electronic medical record  Diabetes: . Uncontrolled but improved per SMBG;  current treatment: metformin XR 1000 mg QAM, Jardiance 10 mg daily  . Current glucose readings: notes that she does not check often at home because she doesn't like pricking her finger . Pharmacy filled glipizide, and patient's husband was calling to see if she was supposed to be taking that, as he had not been using it to fill her pill box.  . Reviewed current regimen. He will tell the pharmacy to stop filling glipizide. He requested a 90 day supply refill on Jardiance; I provided that.  . Patient needs to be scheduled for f/u fasting lab work (mid 11/2020) and f/u appt w/ PCP (~3-03/2021) w/ PCP. Will collaborate with office staff to schedule this.  Hypertension: . Controlled; current treatment: carvedilol 25 mg BID; lisinopril 40 mg QAM, amlodipine 10 mg QAM . Request 90 day supply refill on lisinopril. Sent today. . Recommended to continue current regimen  Hyperlipidemia, secondary ASCVD risk reduction . Uncontrolled but likely improved d/t medication change; current treatment: atorvastatin 80 mg daily . Antiplatelet therapy: aspirin 81 mg daily,  clopidogrel 75 mg daily. Patient requests refill on clopidogrel . Encouraged to report to the pharmacy that a refill request for clopidogrel (last filled by neurology) be sent to [facility]. He notes he will do that.  . Recommended to continue current regimen.    Patient Goals/Self-Care Activities . Over the next 90 days, patient will:  - take medications as prescribed check blood glucose using CGM, document, and provide at future appointments  Follow Up Plan: Face to Face appointment with care management team member scheduled for:  ~ 3 weeks          The patient verbalized understanding of instructions, educational materials, and care plan provided today and declined offer to receive copy of patient instructions, educational materials, and care plan.   Plan: Face to Face appointment with care management team member scheduled for: ~ 3 weeks  Catie [name], PharmD, [place], [place] Clinical Pharmacist [employer] at [employer] [phone]

## 2020-11-07 NOTE — Telephone Encounter (Signed)
Pt's husband needs a call regarding pt's medications. He said it is messed up.

## 2020-11-08 ENCOUNTER — Telehealth: Payer: Self-pay | Admitting: Internal Medicine

## 2020-11-08 NOTE — Telephone Encounter (Signed)
Left message for patient to  call in to schedule fasting lab for January and a 3 mos follow up with Dr.Mclean

## 2020-11-14 ENCOUNTER — Other Ambulatory Visit: Payer: Self-pay

## 2020-11-14 ENCOUNTER — Other Ambulatory Visit (INDEPENDENT_AMBULATORY_CARE_PROVIDER_SITE_OTHER): Payer: Self-pay | Admitting: Nurse Practitioner

## 2020-11-14 DIAGNOSIS — I6523 Occlusion and stenosis of bilateral carotid arteries: Secondary | ICD-10-CM

## 2020-11-14 DIAGNOSIS — E119 Type 2 diabetes mellitus without complications: Secondary | ICD-10-CM

## 2020-11-14 MED ORDER — METFORMIN HCL ER 500 MG PO TB24: 1000.0000 mg | ORAL_TABLET | Freq: Every day | ORAL | 1 refills | Status: DC

## 2020-11-19 ENCOUNTER — Ambulatory Visit (INDEPENDENT_AMBULATORY_CARE_PROVIDER_SITE_OTHER): Payer: PPO

## 2020-11-19 ENCOUNTER — Other Ambulatory Visit: Payer: Self-pay

## 2020-11-19 ENCOUNTER — Ambulatory Visit (INDEPENDENT_AMBULATORY_CARE_PROVIDER_SITE_OTHER): Payer: PPO | Admitting: Vascular Surgery

## 2020-11-19 DIAGNOSIS — I6523 Occlusion and stenosis of bilateral carotid arteries: Secondary | ICD-10-CM | POA: Diagnosis not present

## 2020-11-22 ENCOUNTER — Other Ambulatory Visit: Payer: Self-pay

## 2020-11-27 ENCOUNTER — Ambulatory Visit: Payer: PPO | Admitting: Pharmacist

## 2020-11-27 DIAGNOSIS — Z8673 Personal history of transient ischemic attack (TIA), and cerebral infarction without residual deficits: Secondary | ICD-10-CM

## 2020-11-27 DIAGNOSIS — I10 Essential (primary) hypertension: Secondary | ICD-10-CM

## 2020-11-27 DIAGNOSIS — E1165 Type 2 diabetes mellitus with hyperglycemia: Secondary | ICD-10-CM

## 2020-11-27 DIAGNOSIS — E785 Hyperlipidemia, unspecified: Secondary | ICD-10-CM

## 2020-11-27 NOTE — Patient Instructions (Signed)
Visit Information  Patient Care Plan: Medication Management    Problem Identified: Diabetes, Hx CVA, HTN, HLD     Long-Range Goal: Disease State Management   This Visit's Progress: On track  Priority: High  Note:   Current Barriers:  . Unable to independently monitor therapeutic efficacy . Unable to achieve control of cardiovascular risk reduction   Pharmacist Clinical Goal(s):  Marland Kitchen Over the next 90 days, patient will achieve adherence to monitoring guidelines and medication adherence to achieve therapeutic efficacy. . Over the next 90 days, achieve control of diabetes as evidenced by improvement in A1c through collaboration with PharmD and provider.   Interventions: . 1:1 collaboration with McLean-Scocuzza, Pasty Spillers, MD regarding development and update of comprehensive plan of care as evidenced by provider attestation and co-signature . Inter-disciplinary care team collaboration (see longitudinal plan of care) . Comprehensive medication review performed; medication list updated in electronic medical record  Health Maintenance: . Reviewed need to schedule PCP f/u in ~ March - May. Patient will call the office to schedule this.  . Received COVID series + booster. Up to date on other vaccinations  Diabetes: . Uncontrolled but improved per SMBG;  current treatment: metformin XR 1000 mg QAM, Jardiance 10 mg daily  . Current glucose readings: utilizing CGM. Does not have the reader nearby during our call for her to review readings with me, but does report readings In the 80-140s throughout the day. One time had a reading of 72, but no symptoms of hypoglycemia.  . Anticipate improvement in A1c. Assisted patient in scheduling fasting lab work in ~ 4 weeks. Scheduled simultaneous face to face appt with me to review CGM readings, discuss how to cut off glucose <70 alarm, as patient is not on a hypoglycemic-prone medication (such as sulfonylurea or insulin).  . Continue current regimen at this  time  Hypertension: . Controlled; current treatment: carvedilol 25 mg BID; lisinopril 40 mg QAM, amlodipine 10 mg QAM . Reviewed refill history. Patient up to date.  . Recommended to continue current regimen  Hyperlipidemia, secondary ASCVD risk reduction . Uncontrolled but likely improved d/t medication change; current treatment: atorvastatin 80 mg daily (confirms she is not taking 40 mg dose) . Antiplatelet therapy: aspirin 81 mg daily, clopidogrel 75 mg daily . Reviewed refill history. Patient up to date.  . Recommended to continue current regimen.   Supplements: . Currently taking OTC Vitamin B12, Vitamin D. Continue current regimen  Patient Goals/Self-Care Activities . Over the next 90 days, patient will:  - take medications as prescribed check blood glucose using CGM, document, and provide at future appointments  Follow Up Plan: Face to Face appointment with care management team member scheduled for:  ~ 4 weeks        The patient verbalized understanding of instructions, educational materials, and care plan provided today and declined offer to receive copy of patient instructions, educational materials, and care plan.   Plan: Face to Face appointment with care management team member scheduled for: ~ 4 weeks  Catie Feliz Beam, PharmD, [place], C[place] Clinical Pharmacist [employer] at [employer] [phone]

## 2020-11-27 NOTE — Chronic Care Management (AMB) (Signed)
Chronic Care Management   Pharmacy Note  11/27/2020 Name: Tammy Nunez MRN: [id number] DOB: 06/15/1951  Subjective:  Tammy Nunez is a 70 y.o. year old female who is a primary care patient of McLean-Scocuzza, Tammy Glow, MD. The CCM team was consulted for assistance with chronic disease management and care coordination needs.    Engaged with patient by telephone (scheduled for face to face, but changed to telephone d/t inclement weather) for follow up visit in response to provider referral for pharmacy case management and/or care coordination services.   Consent to Services:  Patient was given information about Chronic Care Management services, agreed to services, and gave verbal consent prior to initiation of services on 09/22/2019. Please see initial visit note for detailed documentation.   Objective:  Lab Results  Component Value Date   CREATININE 0.93 08/02/2020   CREATININE 0.91 12/08/2019   CREATININE 0.84 07/28/2019    Lab Results  Component Value Date   HGBA1C 7.5 (H) 08/02/2020       Component Value Date/Time   CHOL 167 08/02/2020 0947   TRIG 65.0 08/02/2020 0947   HDL 49.90 08/02/2020 0947   CHOLHDL 3 08/02/2020 0947   VLDL 13.0 08/02/2020 0947   LDLCALC 104 (H) 08/02/2020 0947   Last vitamin D Lab Results  Component Value Date   VD25OH 65.22 08/02/2020    BP Readings from Last 3 Encounters:  10/11/20 118/68  07/10/20 111/66  04/17/20 (!) 153/73    Assessment/Interventions: Review of patient past medical history, allergies, medications, health status, including review of consultants reports, laboratory and other test data, was performed as part of comprehensive evaluation and provision of chronic care management services.   SDOH (Social Determinants of Health) assessments and interventions performed:  SDOH Interventions   Flowsheet Row Most Recent Value  SDOH Interventions   Financial Strain Interventions Intervention Not Indicated  [medicare extra  help]       CCM Care Plan  No Known Allergies  Medications Reviewed Today    Reviewed by De Hollingshead, RPH-CPP (Pharmacist) on 11/27/20 at [phone]  Med List Status: <None>  Medication Order Taking? Sig Documenting Provider Last Dose Status Informant  acetaminophen (TYLENOL) 325 MG tablet [id number] No Take 1-2 tablets (325-650 mg total) by mouth every 4 (four) hours as needed for mild pain.  Patient not taking: No sig reported   Bary Leriche, PA-C Not Taking Active Pharmacy Records  amLODipine (NORVASC) 10 MG tablet [id number] Yes Take 1 tablet (10 mg total) by mouth daily. McLean-Scocuzza, Tammy Glow, MD Taking Active   aspirin EC 81 MG EC tablet [id number] Yes Take 1 tablet (81 mg total) by mouth daily. Hillary Bow, MD Taking Active Pharmacy Records  atorvastatin (LIPITOR) 80 MG tablet [id number] Yes Take 1 tablet (80 mg total) by mouth daily. McLean-Scocuzza, Tammy Glow, MD Taking Active   blood glucose meter kit and supplies [id number]  1 each by Other route 4 (four) times daily. Dispense based on patient and insurance preference. Four times daily as directed. (FOR ICD-10 E10.9, E11.9). [name], FNP  Active   carvedilol (COREG) 25 MG tablet [id number] Yes Take 1 tablet (25 mg total) by mouth 2 (two) times daily with a meal. McLean-Scocuzza, Tammy Glow, MD Taking Active   clopidogrel (PLAVIX) 75 MG tablet [id number] Yes Take 1 tablet (75 mg total) by mouth daily. Further refills [facility] neurology McLean-Scocuzza, Tammy Glow, MD Taking Active   Continuous Blood Gluc Sensor (FREESTYLE [place] 2 SENSOR) [place] [id number] Yes Use to  check glucose at least every 8 hours McLean-Scocuzza, Tammy Glow, MD Taking Active   empagliflozin (JARDIANCE) 10 MG TABS tablet [id number] Yes Take 1 tablet (10 mg total) by mouth daily. McLean-Scocuzza, Tammy Glow, MD Taking Active   lisinopril (ZESTRIL) 40 MG tablet [id number] Yes Take 1 tablet (40 mg total) by mouth daily. McLean-Scocuzza, Tammy Glow, MD Taking Active   metFORMIN  (GLUCOPHAGE XR) 500 MG 24 hr tablet [id number] Yes Take 2 tablets (1,000 mg total) by mouth daily with breakfast. McLean-Scocuzza, Tammy Glow, MD Taking Active   potassium chloride SA (KLOR-CON M20) 20 MEQ tablet [id number] Yes Take 1 tablet (20 mEq total) by mouth daily. McLean-Scocuzza, Tammy Glow, MD Taking Active   vitamin B-12 (CYANOCOBALAMIN) 1000 MCG tablet [id number] Yes Take 1 tablet (1,000 mcg total) by mouth daily. Tammy Nunez Taking Active Pharmacy Records  Vitamin D, Cholecalciferol, 50 MCG (2000 UT) CAPS [id number] Yes Take 4,000 Units by mouth. [provider] Taking Active   Med List Note Dorette Grate, RN 07/20/19 0383): Pt asked me to review meds with husband but he left. She said she did not take any meds today.           Patient Active Problem List   Diagnosis Date Noted  . Obesity, diabetes, and hypertension syndrome ([place]) 10/11/2020  . Hypertension associated with diabetes ([place]) 10/11/2020  . Cataract of both eyes 12/28/2019  . Vitamin D deficiency 12/11/2019  . History of stroke 09/07/2019  . Left hemiparesis ([place]) 09/07/2019  . Carotid stenosis, symptomatic, with infarction ([place]) 07/20/2019  . Type 2 diabetes mellitus with hyperglycemia, without long-term current use of insulin ([place])   . AKI (acute kidney injury) ([place])   . Essential hypertension   . Seizures ([place])   . New onset type 2 diabetes mellitus ([place])     Conditions to be addressed/monitored: HTN, HLD and DM  Patient Care Plan: Medication Management    Problem Identified: Diabetes, Hx CVA, HTN, HLD     Long-Range Goal: Disease State Management   This Visit's Progress: On track  Priority: High  Note:   Current Barriers:  . Unable to independently monitor therapeutic efficacy . Unable to achieve control of cardiovascular risk reduction   Pharmacist Clinical Goal(s):  Marland Kitchen Over the next 90 days, patient will achieve adherence to monitoring guidelines and medication adherence to achieve  therapeutic efficacy. . Over the next 90 days, achieve control of diabetes as evidenced by improvement in A1c through collaboration with PharmD and provider.   Interventions: . 1:1 collaboration with McLean-Scocuzza, Tammy Glow, MD regarding development and update of comprehensive plan of care as evidenced by provider attestation and co-signature . Inter-disciplinary care team collaboration (see longitudinal plan of care) . Comprehensive medication review performed; medication list updated in electronic medical record  Health Maintenance: . Reviewed need to schedule PCP f/u in ~ March - May. Patient will call the office to schedule this.  . Received COVID series + booster. Up to date on other vaccinations  Diabetes: . Uncontrolled but improved per SMBG;  current treatment: metformin XR 1000 mg QAM, Jardiance 10 mg daily  . Current glucose readings: utilizing CGM. Does not have the reader nearby during our call for her to review readings with me, but does report readings In the 80-140s throughout the day. One time had a reading of 72, but no symptoms of hypoglycemia.  . Anticipate improvement in A1c. Assisted patient in scheduling fasting lab work in ~ 4 weeks. Scheduled simultaneous face  to face appt with me to review CGM readings, discuss how to cut off glucose <70 alarm, as patient is not on a hypoglycemic-prone medication (such as sulfonylurea or insulin).  . Continue current regimen at this time  Hypertension: . Controlled; current treatment: carvedilol 25 mg BID; lisinopril 40 mg QAM, amlodipine 10 mg QAM . Reviewed refill history. Patient up to date.  . Recommended to continue current regimen  Hyperlipidemia, secondary ASCVD risk reduction . Uncontrolled but likely improved d/t medication change; current treatment: atorvastatin 80 mg daily (confirms she is not taking 40 mg dose) . Antiplatelet therapy: aspirin 81 mg daily, clopidogrel 75 mg daily . Reviewed refill history. Patient up to  date.  . Recommended to continue current regimen.   Supplements: . Currently taking OTC Vitamin B12, Vitamin D. Continue current regimen  Patient Goals/Self-Care Activities . Over the next 90 days, patient will:  - take medications as prescribed check blood glucose using CGM, document, and provide at future appointments  Follow Up Plan: Face to Face appointment with care management team member scheduled for:  ~ 4 weeks        Medication Assistance: None required. Patient affirms current coverage meets needs.  Full Medicare Extra Help.   Plan: Face to Face appointment with care management team member scheduled for: ~ 4 weeks  Catie [name], PharmD, [place], [place] Clinical Pharmacist [employer] at [employer] [phone]

## 2020-11-28 ENCOUNTER — Encounter (INDEPENDENT_AMBULATORY_CARE_PROVIDER_SITE_OTHER): Payer: Self-pay | Admitting: *Deleted

## 2021-01-01 ENCOUNTER — Other Ambulatory Visit: Payer: Self-pay

## 2021-01-03 ENCOUNTER — Other Ambulatory Visit (INDEPENDENT_AMBULATORY_CARE_PROVIDER_SITE_OTHER): Payer: PPO

## 2021-01-03 ENCOUNTER — Other Ambulatory Visit: Payer: Self-pay

## 2021-01-03 ENCOUNTER — Ambulatory Visit (INDEPENDENT_AMBULATORY_CARE_PROVIDER_SITE_OTHER): Payer: PPO | Admitting: Pharmacist

## 2021-01-03 DIAGNOSIS — I152 Hypertension secondary to endocrine disorders: Secondary | ICD-10-CM | POA: Diagnosis not present

## 2021-01-03 DIAGNOSIS — E785 Hyperlipidemia, unspecified: Secondary | ICD-10-CM | POA: Diagnosis not present

## 2021-01-03 DIAGNOSIS — I10 Essential (primary) hypertension: Secondary | ICD-10-CM | POA: Diagnosis not present

## 2021-01-03 DIAGNOSIS — Z1329 Encounter for screening for other suspected endocrine disorder: Secondary | ICD-10-CM | POA: Diagnosis not present

## 2021-01-03 DIAGNOSIS — E1159 Type 2 diabetes mellitus with other circulatory complications: Secondary | ICD-10-CM | POA: Diagnosis not present

## 2021-01-03 DIAGNOSIS — E1165 Type 2 diabetes mellitus with hyperglycemia: Secondary | ICD-10-CM | POA: Diagnosis not present

## 2021-01-03 DIAGNOSIS — Z8673 Personal history of transient ischemic attack (TIA), and cerebral infarction without residual deficits: Secondary | ICD-10-CM

## 2021-01-03 LAB — CBC WITH DIFFERENTIAL/PLATELET
Basophils Absolute: 0 10*3/uL (ref 0.0–0.1)
Basophils Relative: 0.6 % (ref 0.0–3.0)
Eosinophils Absolute: 0.1 10*3/uL (ref 0.0–0.7)
Eosinophils Relative: 1.2 % (ref 0.0–5.0)
HCT: 43.2 % (ref 36.0–46.0)
Hemoglobin: 14.2 g/dL (ref 12.0–15.0)
Lymphocytes Relative: 35.9 % (ref 12.0–46.0)
Lymphs Abs: 2.5 10*3/uL (ref 0.7–4.0)
MCHC: 32.9 g/dL (ref 30.0–36.0)
MCV: 92.3 fl (ref 78.0–100.0)
Monocytes Absolute: 0.4 10*3/uL (ref 0.1–1.0)
Monocytes Relative: 6.2 % (ref 3.0–12.0)
Neutro Abs: 3.9 10*3/uL (ref 1.4–7.7)
Neutrophils Relative %: 56.1 % (ref 43.0–77.0)
Platelets: 175 10*3/uL (ref 150.0–400.0)
RBC: 4.68 Mil/uL (ref 3.87–5.11)
RDW: 14.7 % (ref 11.5–15.5)
WBC: 6.9 10*3/uL (ref 4.0–10.5)

## 2021-01-03 LAB — LIPID PANEL
Cholesterol: 138 mg/dL (ref 0–200)
HDL: 41.2 mg/dL (ref >39.00)
LDL Cholesterol: 81 mg/dL (ref 0–99)
NonHDL: 96.5
Total CHOL/HDL Ratio: 3
Triglycerides: 80 mg/dL (ref 0.0–149.0)
VLDL: 16 mg/dL (ref 0.0–40.0)

## 2021-01-03 LAB — COMPREHENSIVE METABOLIC PANEL
ALT: 12 U/L (ref 0–35)
AST: 14 U/L (ref 0–37)
Albumin: 4.6 g/dL (ref 3.5–5.2)
Alkaline Phosphatase: 46 U/L (ref 39–117)
BUN: 17 mg/dL (ref 6–23)
CO2: 24 mEq/L (ref 19–32)
Calcium: 10.2 mg/dL (ref 8.4–10.5)
Chloride: 106 mEq/L (ref 96–112)
Creatinine, Ser: 1 mg/dL (ref 0.40–1.20)
GFR: 57.32 mL/min — ABNORMAL LOW (ref >60.00)
Glucose, Bld: 130 mg/dL — ABNORMAL HIGH (ref 70–99)
Potassium: 4.4 mEq/L (ref 3.5–5.1)
Sodium: 141 mEq/L (ref 135–145)
Total Bilirubin: 0.5 mg/dL (ref 0.2–1.2)
Total Protein: 6.9 g/dL (ref 6.0–8.3)

## 2021-01-03 LAB — HEMOGLOBIN A1C: Hgb A1c MFr Bld: 7.5 % — ABNORMAL HIGH (ref 4.6–6.5)

## 2021-01-03 LAB — TSH: TSH: 2.67 u[IU]/mL (ref 0.35–4.50)

## 2021-01-03 NOTE — Chronic Care Management (AMB) (Signed)
Chronic Care Management Pharmacy Note  01/03/2021 Name:  Tammy Nunez MRN:  [id number] DOB:  01-Dec-1950  Subjective: Tammy Nunez is an 70 y.o. year old female who is a primary patient of Tammy Nunez.  The CCM team was consulted for assistance with disease management and care coordination needs.    Engaged with patient face to face for follow up visit in response to provider referral for pharmacy case management and/or care coordination services. Her husband was also present for the visit.    Consent to Services:  The patient was given information about Chronic Care Management services, agreed to services, and gave verbal consent prior to initiation of services.  Please see initial visit note for detailed documentation.   Objective:  Lab Results  Component Value Date   CREATININE 0.93 08/02/2020   CREATININE 0.91 12/08/2019   CREATININE 0.84 07/28/2019    Lab Results  Component Value Date   HGBA1C 7.5 (H) 08/02/2020       Component Value Date/Time   CHOL 167 08/02/2020 0947   TRIG 65.0 08/02/2020 0947   HDL 49.90 08/02/2020 0947   CHOLHDL 3 08/02/2020 0947   VLDL 13.0 08/02/2020 0947   LDLCALC 104 (H) 08/02/2020 0947   Last vitamin D Lab Results  Component Value Date   VD25OH 65.22 08/02/2020     Clinical ASCVD: Yes - hx CVA    BP Readings from Last 3 Encounters:  10/11/20 118/68  07/10/20 111/66  04/17/20 (!) 153/73    Assessment: Review of patient past medical history, allergies, medications, health status, including review of consultants reports, laboratory and other test data, was performed as part of comprehensive evaluation and provision of chronic care management services.   SDOH:  (Social Determinants of Health) assessments and interventions performed:  SDOH Interventions   Flowsheet Row Most Recent Value  SDOH Interventions   Financial Strain Interventions Intervention Not Indicated  [medicare extra help]      CCM Care  Plan  No Known Allergies  Medications Reviewed Today    Reviewed by De Hollingshead, RPH-CPP (Pharmacist) on 01/03/21 at [place] List Status: <None>  Medication Order Taking? Sig Documenting Provider Last Dose Status Informant  acetaminophen (TYLENOL) 325 MG tablet [id number]  Take 1-2 tablets (325-650 mg total) by mouth every 4 (four) hours as needed for mild pain.  Patient not taking: No sig reported   Tammy Nunez  Active Pharmacy Records  amLODipine (NORVASC) 10 MG tablet [id number] Yes Take 1 tablet (10 mg total) by mouth daily. Tammy Nunez Taking Active   aspirin EC 81 MG EC tablet [id number] Yes Take 1 tablet (81 mg total) by mouth daily. Hillary Bow, Nunez Taking Active Pharmacy Records  atorvastatin (LIPITOR) 80 MG tablet [id number] Yes Take 1 tablet (80 mg total) by mouth daily. Tammy Nunez Taking Active   blood glucose meter kit and supplies [id number]  1 each by Other route 4 (four) times daily. Dispense based on patient and insurance preference. Four times daily as directed. (FOR ICD-10 E10.9, E11.9). Tammy Green, FNP  Active   carvedilol (COREG) 25 MG tablet [id number] Yes Take 1 tablet (25 mg total) by mouth 2 (two) times daily with a meal. Tammy Nunez Taking Active   clopidogrel (PLAVIX) 75 MG tablet [id number] Yes Take 1 tablet (75 mg total) by mouth daily. Further refills [facility] neurology Tammy Nunez Taking Active   Continuous Blood Gluc Sensor (FREESTYLE [place]  2 SENSOR) MISC [id number]  Use to check glucose at least every 8 hours Tammy Nunez  Active   empagliflozin (JARDIANCE) 10 MG TABS tablet [id number] Yes Take 1 tablet (10 mg total) by mouth daily. Tammy Nunez Taking Active   lisinopril (ZESTRIL) 40 MG tablet [id number] Yes Take 1 tablet (40 mg total) by mouth daily. Tammy Nunez Taking Active   metFORMIN (GLUCOPHAGE XR) 500 MG 24 hr tablet [id number] Yes  Take 2 tablets (1,000 mg total) by mouth daily with breakfast. Tammy Nunez Taking Active   potassium chloride SA (KLOR-CON M20) 20 MEQ tablet [id number] Yes Take 1 tablet (20 mEq total) by mouth daily. Tammy Nunez Taking Active   vitamin B-12 (CYANOCOBALAMIN) 1000 MCG tablet [id number] Yes Take 1 tablet (1,000 mcg total) by mouth daily. Tammy Nunez Taking Active Pharmacy Records  Vitamin D, Cholecalciferol, 50 MCG (2000 UT) CAPS [id number] Yes Take 4,000 Units by mouth. Provider, Historical, Nunez Taking Active   Med List Note Tammy Grate, RN 07/20/19 7342): Pt asked me to review meds with husband but he left. She said she did not take any meds today.           Patient Active Problem List   Diagnosis Date Noted  . Obesity, diabetes, and hypertension syndrome ([place]) 10/11/2020  . Hypertension associated with diabetes ([place]) 10/11/2020  . Cataract of both eyes 12/28/2019  . Vitamin D deficiency 12/11/2019  . History of stroke 09/07/2019  . Left hemiparesis ([place]) 09/07/2019  . Carotid stenosis, symptomatic, with infarction ([place]) 07/20/2019  . Type 2 diabetes mellitus with hyperglycemia, without long-term current use of insulin ([place])   . AKI (acute kidney injury) ([place])   . Essential hypertension   . Seizures ([place])   . New onset type 2 diabetes mellitus ([place])     Conditions to be addressed/monitored: HTN, HLD and DMII  Care Plan : Medication Management  Updates made by De Hollingshead, RPH-CPP since 01/03/2021 12:00 AM    Problem: Diabetes, Hx CVA, HTN, HLD     Long-Range Goal: Disease State Management   Recent Progress: On track  Priority: High  Note:   Current Barriers:  . Unable to independently monitor therapeutic efficacy . Unable to achieve control of cardiovascular risk reduction   Pharmacist Clinical Goal(s):  Marland Kitchen Over the next 90 days, patient will achieve adherence to monitoring guidelines and medication adherence to achieve  therapeutic efficacy. . Over the next 90 days, achieve control of diabetes as evidenced by improvement in A1c through collaboration with PharmD and provider.   Interventions: . 1:1 collaboration with Tammy Nunez regarding development and update of comprehensive plan of care as evidenced by provider attestation and co-signature . Inter-disciplinary care team collaboration (see longitudinal plan of care) . Comprehensive medication review performed; medication list updated in electronic medical record  Health Maintenance: . Lab work collected today.  . Due for DEXA and mammogram. F/u with PCP in May.   Diabetes: . Uncontrolled but improved per SMBG; current treatment: metformin XR 1000 mg QAM, Jardiance 10 mg daily  . Current glucose readings: utilizing CGM:  Date of Download: 12/21/20 - 01/03/21 % Time CGM is active: 45% Average Glucose: 133 mg/dL Glucose Variability: 35.3 (goal <36%) Time in Goal:  - Time in range 70-180: 81% - Time above range: 15% - Time below range: 3% Observed patterns: post breakfast elevations . Current meal patterns: notes that they eat biscuits (bacon  and egg, steak and cheese) for breakfast most mornings.  . Extensive education on reduction in carbohydrates. Suggested eating half of biscuit or making breakfast sandwiches at home with lower carb options (whole grain bread, sandwich thins, etc) . Continue current regimen at this time. A1c pending.  . Reviewed need for yearly DM eye exam.  . Turned off low (<70) and high (>250) blood sugar alarms. <55 low sugar alarm cannot be turned off.   Hypertension: . Controlled per last clinic reading; current treatment: carvedilol 25 mg BID; lisinopril 40 mg QAM, amlodipine 10 mg QAM . Reviewed refill history. Patient up to date.  . Home BP readings: 110-120s/60-70s . Denies any concerns with lightheadedness, dizziness, or any other s/sx hypotension.  . Recommended to continue current  regimen  Hyperlipidemia, secondary ASCVD risk reduction . Uncontrolled but likely improved d/t medication change; current treatment: atorvastatin 80 mg daily . Antiplatelet therapy: aspirin 81 mg daily, clopidogrel 75 mg daily . Reviewed refill history. Patient up to date.  . Neurology f/u scheduled in May.  . Recommended to continue current regimen.   Lipid panel pending.  Supplements: . Currently taking OTC Vitamin B12, Vitamin D. Continue current regimen  Patient Goals/Self-Care Activities . Over the next 90 days, patient will:  - take medications as prescribed check blood glucose three times daily using CGM, document, and provide at future appointments  Follow Up Plan: Face to Face appointment with care management team member scheduled for:  ~ 8 weeks        Medication Assistance: None required.  Patient affirms current coverage meets needs. Medicare Extra Help  Follow Up:  Patient agrees to Care Plan and Follow-up.  Plan: Face to Face appointment with care management team member scheduled for: ~ 8 weeks  Catie [name], PharmD, [place], [place] Clinical Pharmacist [employer] at [employer] [phone]

## 2021-01-03 NOTE — Patient Instructions (Addendum)
It was so good to see you both today!  Swapping out your biscuits for other options would be good to help with your sugars. Instead of eating both sides of the biscuit, try eating just 1/2 of the biscuit, or try some lower carbohydrate options like "Sandwich Thins". Also look for whole grain options (for breads or pastas). Whole grains have more FIBER which helps keep your blood sugars better controlled.   In the few weeks before appointments, try to remember to scan your glucose at least 3 times a day. This will help make sure we capture all of your sugar readings.   Keep up the fantastic work! I scheduled a follow up face to face appointment in April so that we can download your sugar readings and chat.   Catie [name], PharmD  [phone]  Visit Information  PATIENT GOALS: Goals Addressed              This Visit's Progress     Patient Stated   .  Disease Self Management (pt-stated)        Patient Goals/Self-Care Activities . Over the next 90 days, patient will:  - take medications as prescribed check blood glucose three times daily using CGM, document, and provide at future appointments        Print copy of patient instructions, educational materials, and care plan provided in person.   Plan: Face to Face appointment with care management team member scheduled for: ~ 8 weeks  Catie [name], PharmD, [place], [place] Clinical Pharmacist [employer] at [employer] [phone]

## 2021-01-04 ENCOUNTER — Other Ambulatory Visit: Payer: Self-pay | Admitting: Internal Medicine

## 2021-01-04 DIAGNOSIS — N3000 Acute cystitis without hematuria: Secondary | ICD-10-CM

## 2021-01-04 LAB — URINALYSIS, ROUTINE W REFLEX MICROSCOPIC
Bilirubin Urine: NEGATIVE
Nitrite: POSITIVE — AB
Protein, ur: NEGATIVE
RBC / HPF: NONE SEEN /HPF (ref 0–2)
Specific Gravity, Urine: 1.022 (ref 1.001–1.03)
pH: <=5 (ref 5.0–8.0)

## 2021-01-04 LAB — MICROALBUMIN / CREATININE URINE RATIO
Creatinine, Urine: 112 mg/dL (ref 20–275)
Microalb Creat Ratio: 14 mcg/mg creat (ref <30)
Microalb, Ur: 1.6 mg/dL

## 2021-01-08 ENCOUNTER — Other Ambulatory Visit: Payer: Self-pay

## 2021-01-08 DIAGNOSIS — R944 Abnormal results of kidney function studies: Secondary | ICD-10-CM

## 2021-01-10 ENCOUNTER — Other Ambulatory Visit: Payer: Self-pay

## 2021-01-10 ENCOUNTER — Other Ambulatory Visit (INDEPENDENT_AMBULATORY_CARE_PROVIDER_SITE_OTHER): Payer: PPO

## 2021-01-10 DIAGNOSIS — N3000 Acute cystitis without hematuria: Secondary | ICD-10-CM

## 2021-01-12 LAB — URINE CULTURE
MICRO NUMBER:: 11547355
SPECIMEN QUALITY:: ADEQUATE

## 2021-01-21 ENCOUNTER — Telehealth: Payer: Self-pay | Admitting: Internal Medicine

## 2021-01-21 DIAGNOSIS — E876 Hypokalemia: Secondary | ICD-10-CM

## 2021-01-21 MED ORDER — POTASSIUM CHLORIDE CRYS ER 20 MEQ PO TBCR: 20.0000 meq | EXTENDED_RELEASE_TABLET | Freq: Every day | ORAL | 3 refills | Status: DC

## 2021-01-21 NOTE — Telephone Encounter (Signed)
Patient needs a refill on potassium chloride SA (KLOR-CON M20) 20 MEQ tablet.

## 2021-02-04 ENCOUNTER — Other Ambulatory Visit (INDEPENDENT_AMBULATORY_CARE_PROVIDER_SITE_OTHER): Payer: PPO

## 2021-02-04 ENCOUNTER — Other Ambulatory Visit: Payer: Self-pay

## 2021-02-04 DIAGNOSIS — R944 Abnormal results of kidney function studies: Secondary | ICD-10-CM | POA: Diagnosis not present

## 2021-02-04 LAB — COMPREHENSIVE METABOLIC PANEL
ALT: 13 U/L (ref 0–35)
AST: 13 U/L (ref 0–37)
Albumin: 4.6 g/dL (ref 3.5–5.2)
Alkaline Phosphatase: 45 U/L (ref 39–117)
BUN: 18 mg/dL (ref 6–23)
CO2: 24 mEq/L (ref 19–32)
Calcium: 10 mg/dL (ref 8.4–10.5)
Chloride: 102 mEq/L (ref 96–112)
Creatinine, Ser: 0.88 mg/dL (ref 0.40–1.20)
GFR: 66.78 mL/min (ref >60.00)
Glucose, Bld: 140 mg/dL — ABNORMAL HIGH (ref 70–99)
Potassium: 4 mEq/L (ref 3.5–5.1)
Sodium: 137 mEq/L (ref 135–145)
Total Bilirubin: 0.4 mg/dL (ref 0.2–1.2)
Total Protein: 7.3 g/dL (ref 6.0–8.3)

## 2021-02-11 ENCOUNTER — Telehealth: Payer: Self-pay

## 2021-02-11 DIAGNOSIS — I10 Essential (primary) hypertension: Secondary | ICD-10-CM

## 2021-02-11 DIAGNOSIS — I639 Cerebral infarction, unspecified: Secondary | ICD-10-CM

## 2021-02-11 MED ORDER — CLOPIDOGREL BISULFATE 75 MG PO TABS: 75.0000 mg | ORAL_TABLET | Freq: Every day | ORAL | 0 refills | Status: DC

## 2021-02-11 MED ORDER — CARVEDILOL 25 MG PO TABS: 25.0000 mg | ORAL_TABLET | Freq: Two times a day (BID) | ORAL | 3 refills | Status: DC

## 2021-02-11 NOTE — Telephone Encounter (Signed)
Pt needs refill on carvedilol (COREG) 25 MG tablet and clopidogrel (PLAVIX) 75 MG tablet sent to [facility] on [address]

## 2021-02-27 ENCOUNTER — Ambulatory Visit: Payer: PPO

## 2021-03-04 DIAGNOSIS — E119 Type 2 diabetes mellitus without complications: Secondary | ICD-10-CM | POA: Diagnosis not present

## 2021-03-04 DIAGNOSIS — H35033 Hypertensive retinopathy, bilateral: Secondary | ICD-10-CM | POA: Diagnosis not present

## 2021-03-04 DIAGNOSIS — H2513 Age-related nuclear cataract, bilateral: Secondary | ICD-10-CM | POA: Diagnosis not present

## 2021-03-04 LAB — HM DIABETES EYE EXAM

## 2021-03-06 ENCOUNTER — Encounter: Payer: Self-pay | Admitting: Internal Medicine

## 2021-03-19 ENCOUNTER — Other Ambulatory Visit: Payer: Self-pay

## 2021-03-21 ENCOUNTER — Ambulatory Visit (INDEPENDENT_AMBULATORY_CARE_PROVIDER_SITE_OTHER): Payer: PPO | Admitting: Pharmacist

## 2021-03-21 ENCOUNTER — Other Ambulatory Visit: Payer: Self-pay

## 2021-03-21 DIAGNOSIS — E1165 Type 2 diabetes mellitus with hyperglycemia: Secondary | ICD-10-CM | POA: Diagnosis not present

## 2021-03-21 MED ORDER — EMPAGLIFLOZIN 25 MG PO TABS: 25.0000 mg | ORAL_TABLET | Freq: Every day | ORAL | 3 refills | Status: DC

## 2021-03-21 NOTE — Patient Instructions (Addendum)
I think you have been taking an old medication, glipizide. STOP THAT.   We are going to increase your Jardiance to 25 mg daily since we are stopping the glipizide. You can take 2 of the 10 mg tablets of Jardiance to use up the supply you currently have.  I called the pharmacy to cancel refills on the OLD MEDICATION - glipizide, and the OLD DOSE of the cholesterol medication.   You should be taking  Morning: Carvedilol 25 mg - blood pressure, heart rate Lisinopril 40 mg - blood pressure Metformin XR 500 mg - blood sugar Aspirin 81 mg - stroke prevention Clopidogrel 75 mg - stroke prevention Jardiance 25 mg - blood sugar, increased dose Potassium 20 mEq Vitamin B12 Vitamin D  Evening: Carvedilol 25 mg - blood pressure, heart rate Metformin XR 500 mg - blood sugar Amlodipine 10 mg - blood pressure Atorvastatin 80 mg - cholesterol  Call me with any questions or concerns! I scheduled a follow up appointment at the end of June.  Catie [name], PharmD [phone]  Visit Information  PATIENT GOALS: Goals Addressed              This Visit's Progress     Patient Stated   .  Disease Self Management (pt-stated)        Patient Goals/Self-Care Activities . Over the next 90 days, patient will:  - take medications as prescribed check blood glucose three times daily using CGM, document, and provide at future appointments       Print copy of patient instructions, educational materials, and care plan provided in person.  Plan: Face to Face appointment with care management team member scheduled for: ~ 8 weeks  Catie [name], PharmD, [place], [place] Clinical Pharmacist [employer] [facility] [employer] [phone]

## 2021-03-21 NOTE — Chronic Care Management (AMB) (Signed)
Chronic Care Management Pharmacy Note  03/21/2021 Name:  Tammy Nunez MRN:  [id number] DOB:  09/17/1951  Subjective: Tammy Nunez is an 70 y.o. year old female who is a primary patient of McLean-Scocuzza, Nino Glow, MD.  The CCM team was consulted for assistance with disease management and care coordination needs.    Engaged with patient face to face for follow up visit in response to provider referral for pharmacy case management and/or care coordination services. Her husband was also present for the visit  Consent to Services:  The patient was given information about Chronic Care Management services, agreed to services, and gave verbal consent prior to initiation of services.  Please see initial visit note for detailed documentation.   Patient Care Team: McLean-Scocuzza, Nino Glow, MD as PCP - General (Internal Medicine) De Hollingshead, RPH-CPP as Pharmacist (Pharmacist)  Recent office visits: None since our last call  Recent consult visits: None since our last call  Hospital visits: None in previous 6 months  Objective:  Lab Results  Component Value Date   CREATININE 0.88 02/04/2021   CREATININE 1.00 01/03/2021   CREATININE 0.93 08/02/2020    Lab Results  Component Value Date   HGBA1C 7.5 (H) 01/03/2021   Last diabetic Eye exam:  Lab Results  Component Value Date/Time   HMDIABEYEEXA No Retinopathy 03/04/2021 12:00 AM   HMDIABEYEEXA No Retinopathy 03/04/2021 12:00 AM    Last diabetic Foot exam: No results found for: HMDIABFOOTEX      Component Value Date/Time   CHOL 138 01/03/2021 0845   TRIG 80.0 01/03/2021 0845   HDL 41.20 01/03/2021 0845   CHOLHDL 3 01/03/2021 0845   VLDL 16.0 01/03/2021 0845   LDLCALC 81 01/03/2021 0845    Hepatic Function Latest Ref Rng & Units 02/04/2021 01/03/2021 08/02/2020  Total Protein 6.0 - 8.3 g/dL 7.3 6.9 7.2  Albumin 3.5 - 5.2 g/dL 4.6 4.6 4.8  AST 0 - 37 U/L 13 14 12   ALT 0 - 35 U/L 13 12 12   Alk Phosphatase 39 -  117 U/L 45 46 60  Total Bilirubin 0.2 - 1.2 mg/dL 0.4 0.5 0.4    Lab Results  Component Value Date/Time   TSH 2.67 01/03/2021 08:45 AM   TSH 3.52 12/08/2019 10:15 AM    CBC Latest Ref Rng & Units 01/03/2021 08/02/2020 12/08/2019  WBC 4.0 - 10.5 K/uL 6.9 6.6 7.0  Hemoglobin 12.0 - 15.0 g/dL 14.2 13.2 13.5  Hematocrit 36.0 - 46.0 % 43.2 39.1 40.3  Platelets 150.0 - 400.0 K/uL 175.0 179.0 164.0    Lab Results  Component Value Date/Time   VD25OH 65.22 08/02/2020 09:47 AM   VD25OH 21.69 (L) 12/08/2019 10:15 AM    Clinical ASCVD: Yes  The ASCVD Risk score Mikey Bussing DC Jr., et al., 2013) failed to calculate for the following reasons:   The patient has a prior MI or stroke diagnosis      Social History   Tobacco Use  Smoking Status Former Smoker  . Packs/day: 0.50  . Years: 15.00  . Pack years: 7.50  . Types: Cigarettes  . Quit date: 04/11/2019  . Years since quitting: 1.9  Smokeless Tobacco Never Used   BP Readings from Last 3 Encounters:  03/21/21 115/68  10/11/20 118/68  07/10/20 111/66   Pulse Readings from Last 3 Encounters:  10/11/20 74  04/17/20 70  02/15/20 85   Wt Readings from Last 3 Encounters:  10/11/20 165 lb 9.6 oz (75.1 kg)  07/10/20 161  lb 9.6 oz (73.3 kg)  02/15/20 161 lb 9.6 oz (73.3 kg)    Assessment: Review of patient past medical history, allergies, medications, health status, including review of consultants reports, laboratory and other test data, was performed as part of comprehensive evaluation and provision of chronic care management services.   SDOH:  (Social Determinants of Health) assessments and interventions performed:  SDOH Interventions   Flowsheet Row Most Recent Value  SDOH Interventions   Financial Strain Interventions Other (Comment)  [approved for Medicare Extra Help]      CCM Care Plan  No Known Allergies  Medications Reviewed Today    Reviewed by De Hollingshead, RPH-CPP (Pharmacist) on 03/21/21 at 95  Med List  Status: <None>  Medication Order Taking? Sig Documenting Provider Last Dose Status Informant  acetaminophen (TYLENOL) 325 MG tablet [id number]  Take 1-2 tablets (325-650 mg total) by mouth every 4 (four) hours as needed for mild pain.  Patient not taking: No sig reported   Flora Lipps  Active Pharmacy Records  amLODipine (NORVASC) 10 MG tablet [id number] Yes Take 1 tablet (10 mg total) by mouth daily. McLean-Scocuzza, Nino Glow, MD Taking Active   aspirin EC 81 MG EC tablet [id number] Yes Take 1 tablet (81 mg total) by mouth daily. Hillary Bow, MD Taking Active Pharmacy Records  atorvastatin (LIPITOR) 80 MG tablet [id number] Yes Take 1 tablet (80 mg total) by mouth daily. McLean-Scocuzza, Nino Glow, MD Taking Active   blood glucose meter kit and supplies [id number] No 1 each by Other route 4 (four) times daily. Dispense based on patient and insurance preference. Four times daily as directed. (FOR ICD-10 E10.9, E11.9).  Patient not taking: Reported on 03/21/2021   [name], FNP Not Taking Active   carvedilol (COREG) 25 MG tablet [id number] Yes Take 1 tablet (25 mg total) by mouth 2 (two) times daily with a meal. McLean-Scocuzza, Nino Glow, MD Taking Active   clopidogrel (PLAVIX) 75 MG tablet [id number] Yes Take 1 tablet (75 mg total) by mouth daily. Further refills [facility] neurology McLean-Scocuzza, Nino Glow, MD Taking Active   Continuous Blood Gluc Sensor (FREESTYLE LIBRE 2 SENSOR) [place] [id number]  Use to check glucose at least every 8 hours McLean-Scocuzza, Nino Glow, MD  Active   empagliflozin (JARDIANCE) 10 MG TABS tablet [id number] Yes Take 1 tablet (10 mg total) by mouth daily. McLean-Scocuzza, Nino Glow, MD Taking Active   lisinopril (ZESTRIL) 40 MG tablet [id number] Yes Take 1 tablet (40 mg total) by mouth daily. McLean-Scocuzza, Nino Glow, MD Taking Active   metFORMIN (GLUCOPHAGE XR) 500 MG 24 hr tablet [id number] Yes Take 2 tablets (1,000 mg total) by mouth daily with breakfast. McLean-Scocuzza, Nino Glow, MD Taking Active            Med Note [name], Arville Lime   Thu Mar 21, 2021 11:22 AM) Taking 1 tablet twice daily   potassium chloride SA (KLOR-CON M20) 20 MEQ tablet [id number] Yes Take 1 tablet (20 mEq total) by mouth daily. McLean-Scocuzza, Nino Glow, MD Taking Active   vitamin B-12 (CYANOCOBALAMIN) 1000 MCG tablet [id number] Yes Take 1 tablet (1,000 mcg total) by mouth daily. Flora Lipps Taking Active Pharmacy Records  Vitamin D, Cholecalciferol, 50 MCG (2000 UT) CAPS [id number] Yes Take 4,000 Units by mouth. [provider] Taking Active   Med List Note Dorette Grate, RN 07/20/19 2446): Pt asked me to review meds with husband but he left. She said she did not take any meds  today.           Patient Active Problem List   Diagnosis Date Noted  . Obesity, diabetes, and hypertension syndrome ([place]) 10/11/2020  . Hypertension associated with diabetes ([place]) 10/11/2020  . Cataract of both eyes 12/28/2019  . Vitamin D deficiency 12/11/2019  . History of stroke 09/07/2019  . Left hemiparesis ([place]) 09/07/2019  . Carotid stenosis, symptomatic, with infarction ([place]) 07/20/2019  . Type 2 diabetes mellitus with hyperglycemia, without long-term current use of insulin ([place])   . AKI (acute kidney injury) ([place])   . Essential hypertension   . Seizures ([place])   . New onset type 2 diabetes mellitus ([place])     Immunization History  Administered Date(s) Administered  . PFIZER(Purple Top)SARS-COV-2 Vaccination 01/11/2020, 02/01/2020, 09/04/2020  . Pneumococcal Conjugate-13 10/11/2020    Conditions to be addressed/monitored: HTN, HLD, DMII and hx CVA  Care Plan : Medication Management  Updates made by De Hollingshead, RPH-CPP since 03/21/2021 12:00 AM    Problem: Diabetes, Hx CVA, HTN, HLD     Long-Range Goal: Disease State Management   This Visit's Progress: On track  Recent Progress: On track  Priority: High  Note:   Current Barriers:  . Unable to independently  monitor therapeutic efficacy . Unable to achieve control of cardiovascular risk reduction   Pharmacist Clinical Goal(s):  Marland Kitchen Over the next 90 days, patient will achieve adherence to monitoring guidelines and medication adherence to achieve therapeutic efficacy. . Over the next 90 days, achieve control of diabetes as evidenced by improvement in A1c through collaboration with PharmD and provider.   Interventions: . 1:1 collaboration with McLean-Scocuzza, Nino Glow, MD regarding development and update of comprehensive plan of care as evidenced by provider attestation and co-signature . Inter-disciplinary care team collaboration (see longitudinal plan of care) . Comprehensive medication review performed; medication list updated in electronic medical record   Diabetes: . Uncontrolled but improved per SMBG; current treatment: metformin XR 1000 mg QAM - though husband administering as 500 mg BID, Jardiance 10 mg daily; per fil history, pharmacy has continued to fill glipizide script. Unclear if patient has been taking this or not. . Current glucose readings: utilizing Libre 2 CGM Date of Download: Date and time is off.  % Time CGM is active: 52% Average Glucose: 113 mg/dL Glucose Variability: 38.9 (goal <36%) Time in Goal:  - Time in range 70-180: 71% - Time above range: 11% - Time below range: 18% Observed patterns: post breakfast elevations . Current meal patterns: breakfast: grilled chicken biscuit, but removing inside "fluff"; coffee w/ splenda; supper: baked chicken, vegetables, cabbage, meatloaf; very infrequent rice, pasta . Discussed choosing whole grain options for carbohydrates . Contacted pharmacy to cancel refills on glipizide. Given elevated post prandials, increase Jardiance to 25 mg daily. Patient can take 2 of the 10 mg tabs. Continue metformin XR 500 mg BID.  Marland Kitchen Follow up with PCP in ~ 3 weeks as scheduled  Hypertension: . Controlled per clinic reading today; current treatment:  carvedilol 25 mg BID; lisinopril 40 mg QAM, amlodipine 10 mg QAM . Reviewed refill history. Patient up to date.  . Home BP readings: reports higher readings, 130-150s SBP. Advised to bring BP cuff to next in person clinic visit for validation . Denies any concerns with lightheadedness, dizziness, or any other s/sx hypotension.  . Recommended to continue current regimen  Hyperlipidemia, secondary ASCVD risk reduction . Uncontrolled but likely improved d/t medication change; current treatment: atorvastatin 80 mg daily - though prior  to most recent lipid panel, appears that patient had most recently filled 40 mg tab . Antiplatelet therapy: aspirin 81 mg daily, clopidogrel 75 mg daily . Reviewed refill history. Patient up to date.  . Neurology f/u scheduled in May.  . Recommended to continue current regimen.     Supplements: Marland Kitchen Vitamin B12, Vitamin D. Continue current regimen  Patient Goals/Self-Care Activities . Over the next 90 days, patient will:  - take medications as prescribed check blood glucose three times daily using CGM, document, and provide at future appointments  Follow Up Plan: Face to Face appointment with care management team member scheduled for:  ~ 8 weeks        Medication Assistance: None required.  Patient affirms current coverage meets needs.  Patient's preferred pharmacy is:  [facility] [address] ([address]), [place] [address] [place]) [place] [address] Phone: [phone] Fax: [phone]  Uses pill box? Yes Pt endorses 100% compliance  Follow Up:  Patient agrees to Care Plan and Follow-up.  Plan: Face to Face appointment with care management team member scheduled for: ~ 8 weeks  Catie [name], PharmD, [place], [place] Clinical Pharmacist [employer] [facility] [employer] [phone]

## 2021-04-05 ENCOUNTER — Encounter: Payer: Self-pay | Admitting: Internal Medicine

## 2021-04-05 ENCOUNTER — Other Ambulatory Visit: Payer: Self-pay

## 2021-04-05 ENCOUNTER — Ambulatory Visit (INDEPENDENT_AMBULATORY_CARE_PROVIDER_SITE_OTHER): Payer: PPO | Admitting: Internal Medicine

## 2021-04-05 VITALS — BP 150/84 | HR 85 | Temp 98.0°F | Ht 61.0 in | Wt 163.4 lb

## 2021-04-05 DIAGNOSIS — E785 Hyperlipidemia, unspecified: Secondary | ICD-10-CM | POA: Diagnosis not present

## 2021-04-05 DIAGNOSIS — Z1329 Encounter for screening for other suspected endocrine disorder: Secondary | ICD-10-CM

## 2021-04-05 DIAGNOSIS — E559 Vitamin D deficiency, unspecified: Secondary | ICD-10-CM

## 2021-04-05 DIAGNOSIS — E119 Type 2 diabetes mellitus without complications: Secondary | ICD-10-CM

## 2021-04-05 DIAGNOSIS — I639 Cerebral infarction, unspecified: Secondary | ICD-10-CM | POA: Diagnosis not present

## 2021-04-05 DIAGNOSIS — I152 Hypertension secondary to endocrine disorders: Secondary | ICD-10-CM | POA: Diagnosis not present

## 2021-04-05 DIAGNOSIS — R928 Other abnormal and inconclusive findings on diagnostic imaging of breast: Secondary | ICD-10-CM | POA: Diagnosis not present

## 2021-04-05 DIAGNOSIS — C50919 Malignant neoplasm of unspecified site of unspecified female breast: Secondary | ICD-10-CM

## 2021-04-05 DIAGNOSIS — E1159 Type 2 diabetes mellitus with other circulatory complications: Secondary | ICD-10-CM | POA: Diagnosis not present

## 2021-04-05 DIAGNOSIS — C50912 Malignant neoplasm of unspecified site of left female breast: Secondary | ICD-10-CM

## 2021-04-05 MED ORDER — CLOPIDOGREL BISULFATE 75 MG PO TABS: 75.0000 mg | ORAL_TABLET | Freq: Every day | ORAL | 3 refills | Status: DC

## 2021-04-05 MED ORDER — METFORMIN HCL ER 500 MG PO TB24: 1000.0000 mg | ORAL_TABLET | Freq: Every day | ORAL | 3 refills | Status: DC

## 2021-04-05 MED ORDER — ATORVASTATIN CALCIUM 80 MG PO TABS: 80.0000 mg | ORAL_TABLET | Freq: Every day | ORAL | 3 refills | Status: DC

## 2021-04-05 NOTE — Patient Instructions (Addendum)
Think about colonoscopy or cologuard to check for colon cancer  Call and schedule mammogram and bone density   Call neurology and reschedule appt   04/01/2021 Office Visit Neurology Malvin Johns, Cleotis Nipper, MD  [address]  [facility]  [place], [place] [address]  [phone]  [phone] ([address]    Jairo Ben, NP  [address]  [place], [place] [address]  [phone]  [phone] (Fax)         Zoster Vaccine, Recombinant injection-think about this its 2 shots and schedule at pharmacy x 2 doses 2nd dose less than 6 months of 1st What is this medicine? ZOSTER VACCINE (ZOS ter vak SEEN) is a vaccine used to reduce the risk of getting shingles. This vaccine is not used to treat shingles or nerve pain from shingles. This medicine may be used for other purposes; ask your health care provider or pharmacist if you have questions. COMMON BRAND NAME(S): [facility] What should I tell my health care provider before I take this medicine? They need to know if you have any of these conditions:  cancer  immune system problems  an unusual or allergic reaction to Zoster vaccine, other medications, foods, dyes, or preservatives  pregnant or trying to get pregnant  breast-feeding How should I use this medicine? This vaccine is injected into a muscle. It is given by a health care provider. A copy of Vaccine Information Statements will be given before each vaccination. Be sure to read this information carefully each time. This sheet may change often. Talk to your health care provider about the use of this vaccine in children. This vaccine is not approved for use in children. Overdosage: If you think you have taken too much of this medicine contact a poison control center or emergency room at once. NOTE: This medicine is only for you. Do not share this medicine with others. What if I miss a dose? Keep appointments for follow-up (booster) doses. It is  important not to miss your dose. Call your health care provider if you are unable to keep an appointment. What may interact with this medicine?  medicines that suppress your immune system  medicines to treat cancer  steroid medicines like prednisone or cortisone This list may not describe all possible interactions. Give your health care provider a list of all the medicines, herbs, non-prescription drugs, or dietary supplements you use. Also tell them if you smoke, drink alcohol, or use illegal drugs. Some items may interact with your medicine. What should I watch for while using this medicine? Visit your health care provider regularly. This vaccine, like all vaccines, may not fully protect everyone. What side effects may I notice from receiving this medicine? Side effects that you should report to your doctor or health care professional as soon as possible:  allergic reactions (skin rash, itching or hives; swelling of the face, lips, or tongue)  trouble breathing Side effects that usually do not require medical attention (report these to your doctor or health care professional if they continue or are bothersome):  chills  headache  fever  nausea  pain, redness, or irritation at site where injected  tiredness  vomiting This list may not describe all possible side effects. Call your doctor for medical advice about side effects. You may report side effects to FDA at 1-800-FDA-1088. Where should I keep my medicine? This vaccine is only given by a health care provider. It will not be stored at home. NOTE: This sheet is a summary. It  may not cover all possible information. If you have questions about this medicine, talk to your doctor, pharmacist, or health care provider.  2021 Elsevier/Gold Standard (2019-12-16 16:23:07)  DASH Eating Plan DASH stands for Dietary Approaches to Stop Hypertension. The DASH eating plan is a healthy eating plan that has been shown to:  Reduce high  blood pressure (hypertension).  Reduce your risk for type 2 diabetes, heart disease, and stroke.  Help with weight loss. What are tips for following this plan? Reading food labels  Check food labels for the amount of salt (sodium) per serving. Choose foods with less than 5 percent of the Daily Value of sodium. Generally, foods with less than 300 milligrams (mg) of sodium per serving fit into this eating plan.  To find whole grains, look for the word "whole" as the first word in the ingredient list. Shopping  Buy products labeled as "low-sodium" or "no salt added."  Buy fresh foods. Avoid canned foods and pre-made or frozen meals. Cooking  Avoid adding salt when cooking. Use salt-free seasonings or herbs instead of table salt or sea salt. Check with your health care provider or pharmacist before using salt substitutes.  Do not fry foods. Cook foods using healthy methods such as baking, boiling, grilling, roasting, and broiling instead.  Cook with heart-healthy oils, such as olive, canola, avocado, soybean, or sunflower oil. Meal planning  Eat a balanced diet that includes: ? 4 or more servings of fruits and 4 or more servings of vegetables each day. Try to fill one-half of your plate with fruits and vegetables. ? 6-8 servings of whole grains each day. ? Less than 6 oz (170 g) of lean meat, poultry, or fish each day. A 3-oz (85-g) serving of meat is about the same size as a deck of cards. One egg equals 1 oz (28 g). ? 2-3 servings of low-fat dairy each day. One serving is 1 cup (237 mL). ? 1 serving of nuts, seeds, or beans 5 times each week. ? 2-3 servings of heart-healthy fats. Healthy fats called omega-3 fatty acids are found in foods such as walnuts, flaxseeds, fortified milks, and eggs. These fats are also found in cold-water fish, such as sardines, salmon, and mackerel.  Limit how much you eat of: ? Canned or prepackaged foods. ? Food that is high in trans fat, such as some  fried foods. ? Food that is high in saturated fat, such as fatty meat. ? Desserts and other sweets, sugary drinks, and other foods with added sugar. ? Full-fat dairy products.  Do not salt foods before eating.  Do not eat more than 4 egg yolks a week.  Try to eat at least 2 vegetarian meals a week.  Eat more home-cooked food and less restaurant, buffet, and fast food.   Lifestyle  When eating at a restaurant, ask that your food be prepared with less salt or no salt, if possible.  If you drink alcohol: ? Limit how much you use to:  0-1 drink a day for women who are not pregnant.  0-2 drinks a day for men. ? Be aware of how much alcohol is in your drink. In the [place] one drink equals one 12 oz bottle of beer (355 mL), one 5 oz glass of wine (148 mL), or one 1 oz glass of hard liquor (44 mL). General information  Avoid eating more than 2,300 mg of salt a day. If you have hypertension, you may need to reduce your sodium intake to  1,500 mg a day.  Work with your health care provider to maintain a healthy body weight or to lose weight. Ask what an ideal weight is for you.  Get at least 30 minutes of exercise that causes your heart to beat faster (aerobic exercise) most days of the week. Activities may include walking, swimming, or biking.  Work with your health care provider or dietitian to adjust your eating plan to your individual calorie needs. What foods should I eat? Fruits All fresh, dried, or frozen fruit. Canned fruit in natural juice (without added sugar). Vegetables Fresh or frozen vegetables (raw, steamed, roasted, or grilled). Low-sodium or reduced-sodium tomato and vegetable juice. Low-sodium or reduced-sodium tomato sauce and tomato paste. Low-sodium or reduced-sodium canned vegetables. Grains Whole-grain or whole-wheat bread. Whole-grain or whole-wheat pasta. Brown rice. Orpah Cobb. Bulgur. Whole-grain and low-sodium cereals. Pita bread. Low-fat, low-sodium  crackers. Whole-wheat flour tortillas. Meats and other proteins Skinless chicken or [place]. Ground chicken or [place]. Pork with fat trimmed off. Fish and seafood. Egg whites. Dried beans, peas, or lentils. Unsalted nuts, nut butters, and seeds. Unsalted canned beans. Lean cuts of beef with fat trimmed off. Low-sodium, lean precooked or cured meat, such as sausages or meat loaves. Dairy Low-fat (1%) or fat-free (skim) milk. Reduced-fat, low-fat, or fat-free cheeses. Nonfat, low-sodium ricotta or cottage cheese. Low-fat or nonfat yogurt. Low-fat, low-sodium cheese. Fats and oils Soft margarine without trans fats. Vegetable oil. Reduced-fat, low-fat, or light mayonnaise and salad dressings (reduced-sodium). Canola, safflower, olive, avocado, soybean, and sunflower oils. Avocado. Seasonings and condiments Herbs. Spices. Seasoning mixes without salt. Other foods Unsalted popcorn and pretzels. Fat-free sweets. The items listed above may not be a complete list of foods and beverages you can eat. Contact a dietitian for more information. What foods should I avoid? Fruits Canned fruit in a light or heavy syrup. Fried fruit. Fruit in cream or butter sauce. Vegetables Creamed or fried vegetables. Vegetables in a cheese sauce. Regular canned vegetables (not low-sodium or reduced-sodium). Regular canned tomato sauce and paste (not low-sodium or reduced-sodium). Regular tomato and vegetable juice (not low-sodium or reduced-sodium). Rosita Fire. Olives. Grains Baked goods made with fat, such as croissants, muffins, or some breads. Dry pasta or rice meal packs. Meats and other proteins Fatty cuts of meat. Ribs. Fried meat. Tomasa Blase. Bologna, salami, and other precooked or cured meats, such as sausages or meat loaves. Fat from the back of a pig (fatback). Bratwurst. Salted nuts and seeds. Canned beans with added salt. Canned or smoked fish. Whole eggs or egg yolks. Chicken or [place] with skin. Dairy Whole or 2% milk,  cream, and half-and-half. Whole or full-fat cream cheese. Whole-fat or sweetened yogurt. Full-fat cheese. Nondairy creamers. Whipped toppings. Processed cheese and cheese spreads. Fats and oils Butter. Stick margarine. Lard. Shortening. Ghee. Bacon fat. Tropical oils, such as coconut, palm kernel, or palm oil. Seasonings and condiments Onion salt, garlic salt, seasoned salt, table salt, and sea salt. Worcestershire sauce. Tartar sauce. Barbecue sauce. Teriyaki sauce. Soy sauce, including reduced-sodium. Steak sauce. Canned and packaged gravies. Fish sauce. Oyster sauce. Cocktail sauce. Store-bought horseradish. Ketchup. Mustard. Meat flavorings and tenderizers. Bouillon cubes. Hot sauces. Pre-made or packaged marinades. Pre-made or packaged taco seasonings. Relishes. Regular salad dressings. Other foods Salted popcorn and pretzels. The items listed above may not be a complete list of foods and beverages you should avoid. Contact a dietitian for more information. Where to find more information  National Heart, Lung, and Blood Institute: PopSteam.is  American Heart Association: www.heart.org  Academy of Nutrition and Dietetics: www.eatright.[place]: www.kidney.org Summary  The DASH eating plan is a healthy eating plan that has been shown to reduce high blood pressure (hypertension). It may also reduce your risk for type 2 diabetes, heart disease, and stroke.  When on the DASH eating plan, aim to eat more fresh fruits and vegetables, whole grains, lean proteins, low-fat dairy, and heart-healthy fats.  With the DASH eating plan, you should limit salt (sodium) intake to 2,300 mg a day. If you have hypertension, you may need to reduce your sodium intake to 1,500 mg a day.  Work with your health care provider or dietitian to adjust your eating plan to your individual calorie needs. This information is not intended to replace advice given to you by your health care  provider. Make sure you discuss any questions you have with your health care provider. Document Revised: 10/14/2019 Document Reviewed: 10/14/2019 Elsevier Patient Education  2021 [place].  Diabetes Mellitus and Nutrition, Adult When you have diabetes, or diabetes mellitus, it is very important to have healthy eating habits because your blood sugar (glucose) levels are greatly affected by what you eat and drink. Eating healthy foods in the right amounts, at about the same times every day, can help you:  Control your blood glucose.  Lower your risk of heart disease.  Improve your blood pressure.  Reach or maintain a healthy weight. What can affect my meal plan? Every person with diabetes is different, and each person has different needs for a meal plan. Your health care provider may recommend that you work with a dietitian to make a meal plan that is best for you. Your meal plan may vary depending on factors such as:  The calories you need.  The medicines you take.  Your weight.  Your blood glucose, blood pressure, and cholesterol levels.  Your activity level.  Other health conditions you have, such as heart or kidney disease. How do carbohydrates affect me? Carbohydrates, also called carbs, affect your blood glucose level more than any other type of food. Eating carbs naturally raises the amount of glucose in your blood. Carb counting is a method for keeping track of how many carbs you eat. Counting carbs is important to keep your blood glucose at a healthy level, especially if you use insulin or take certain oral diabetes medicines. It is important to know how many carbs you can safely have in each meal. This is different for every person. Your dietitian can help you calculate how many carbs you should have at each meal and for each snack. How does alcohol affect me? Alcohol can cause a sudden decrease in blood glucose (hypoglycemia), especially if you use insulin or take  certain oral diabetes medicines. Hypoglycemia can be a life-threatening condition. Symptoms of hypoglycemia, such as sleepiness, dizziness, and confusion, are similar to symptoms of having too much alcohol.  Do not drink alcohol if: ? Your health care provider tells you not to drink. ? You are pregnant, may be pregnant, or are planning to become pregnant.  If you drink alcohol: ? Do not drink on an empty stomach. ? Limit how much you use to:  0-1 drink a day for women.  0-2 drinks a day for men. ? Be aware of how much alcohol is in your drink. In the [place] one drink equals one 12 oz bottle of beer (355 mL), one 5 oz glass of wine (148 mL), or one 1 oz glass of hard  liquor (44 mL). ? Keep yourself hydrated with water, diet soda, or unsweetened iced tea.  Keep in mind that regular soda, juice, and other mixers may contain a lot of sugar and must be counted as carbs. What are tips for following this plan? Reading food labels  Start by checking the serving size on the "Nutrition Facts" label of packaged foods and drinks. The amount of calories, carbs, fats, and other nutrients listed on the label is based on one serving of the item. Many items contain more than one serving per package.  Check the total grams (g) of carbs in one serving. You can calculate the number of servings of carbs in one serving by dividing the total carbs by 15. For example, if a food has 30 g of total carbs per serving, it would be equal to 2 servings of carbs.  Check the number of grams (g) of saturated fats and trans fats in one serving. Choose foods that have a low amount or none of these fats.  Check the number of milligrams (mg) of salt (sodium) in one serving. Most people should limit total sodium intake to less than 2,300 mg per day.  Always check the nutrition information of foods labeled as "low-fat" or "nonfat." These foods may be higher in added sugar or refined carbs and should be avoided.  Talk to your  dietitian to identify your daily goals for nutrients listed on the label. Shopping  Avoid buying canned, pre-made, or processed foods. These foods tend to be high in fat, sodium, and added sugar.  Shop around the outside edge of the grocery store. This is where you will most often find fresh fruits and vegetables, bulk grains, fresh meats, and fresh dairy. Cooking  Use low-heat cooking methods, such as baking, instead of high-heat cooking methods like deep frying.  Cook using healthy oils, such as olive, canola, or sunflower oil.  Avoid cooking with butter, cream, or high-fat meats. Meal planning  Eat meals and snacks regularly, preferably at the same times every day. Avoid going long periods of time without eating.  Eat foods that are high in fiber, such as fresh fruits, vegetables, beans, and whole grains. Talk with your dietitian about how many servings of carbs you can eat at each meal.  Eat 4-6 oz (112-168 g) of lean protein each day, such as lean meat, chicken, fish, eggs, or tofu. One ounce (oz) of lean protein is equal to: ? 1 oz (28 g) of meat, chicken, or fish. ? 1 egg. ?  cup (62 g) of tofu.  Eat some foods each day that contain healthy fats, such as avocado, nuts, seeds, and fish.   What foods should I eat? Fruits Berries. Apples. Oranges. Peaches. Apricots. Plums. Grapes. Mango. Papaya. Pomegranate. Kiwi. Cherries. Vegetables Lettuce. Spinach. Leafy greens, including kale, chard, collard greens, and mustard greens. Beets. Cauliflower. Cabbage. Broccoli. Carrots. Green beans. Tomatoes. Peppers. Onions. Cucumbers. Brussels sprouts. Grains Whole grains, such as whole-wheat or whole-grain bread, crackers, tortillas, cereal, and pasta. Unsweetened oatmeal. Quinoa. Brown or wild rice. Meats and other proteins Seafood. Poultry without skin. Lean cuts of poultry and beef. Tofu. Nuts. Seeds. Dairy Low-fat or fat-free dairy products such as milk, yogurt, and cheese. The items  listed above may not be a complete list of foods and beverages you can eat. Contact a dietitian for more information. What foods should I avoid? Fruits Fruits canned with syrup. Vegetables Canned vegetables. Frozen vegetables with butter or cream sauce. Grains Refined white  flour and flour products such as bread, pasta, snack foods, and cereals. Avoid all processed foods. Meats and other proteins Fatty cuts of meat. Poultry with skin. Breaded or fried meats. Processed meat. Avoid saturated fats. Dairy Full-fat yogurt, cheese, or milk. Beverages Sweetened drinks, such as soda or iced tea. The items listed above may not be a complete list of foods and beverages you should avoid. Contact a dietitian for more information. Questions to ask a health care provider  Do I need to meet with a diabetes educator?  Do I need to meet with a dietitian?  What number can I call if I have questions?  When are the best times to check my blood glucose? Where to find more information:  American Diabetes Association: diabetes.org  Academy of Nutrition and Dietetics: www.eatright.[employer] of Diabetes and Digestive and Kidney Diseases: [employer]Funeral.dk  Association of Diabetes Care and Education Specialists: www.diabeteseducator.org Summary  It is important to have healthy eating habits because your blood sugar (glucose) levels are greatly affected by what you eat and drink.  A healthy meal plan will help you control your blood glucose and maintain a healthy lifestyle.  Your health care provider may recommend that you work with a dietitian to make a meal plan that is best for you.  Keep in mind that carbohydrates (carbs) and alcohol have immediate effects on your blood glucose levels. It is important to count carbs and to use alcohol carefully. This information is not intended to replace advice given to you by your health care provider. Make sure you discuss any questions you have  with your health care provider. Document Revised: 10/18/2019 Document Reviewed: 10/18/2019 Elsevier Patient Education  2021 [employer].

## 2021-04-05 NOTE — Progress Notes (Addendum)
Chief Complaint  Patient presents with   Follow-up   F/u  1. htn elevated today on norvasc 10 mg qd coreg 25 mg bid, lis 40 mg qd  2. DM 2 A1c 7.5 mg qd on jardiance 25 mg qd metformin xr 1000 mg qd on lipitor 80 mg qhs 3. 05/08/21  Surgery appt sch 05/09/21 [facility]    DIAGNOSIS:  A. BREAST, LEFT, 12 O'CLOCK, 5 CM FROM NIPPLE; ULTRASOUND-GUIDED CORE  BIOPSY:  - INVASIVE MAMMARY CARCINOMA, NO SPECIAL TYPE.  Size of invasive carcinoma: 2.5 mm in this sample  Histologic grade of invasive carcinoma: Grade 3                       Glandular/tubular differentiation score: 3                       Nuclear pleomorphism score: 2                       Mitotic rate score: 3                       Total score: 8  Ductal carcinoma in situ (DCIS): Present, high-grade, associated with  calcifications  Lymphovascular invasion: Not identified   B. BREAST, LEFT, 11 O'CLOCK, 10 CM FROM NIPPLE; ULTRASOUND-GUIDED CORE  BIOPSY:  - INVASIVE MAMMARY CARCINOMA, NO SPECIAL TYPE.  Size of invasive carcinoma: 8 mm in this sample  Histologic grade of invasive carcinoma: Grade 2                       Glandular/tubular differentiation score: 3                       Nuclear pleomorphism score: 2                       Mitotic rate score: 1                       Total score: 6  DCIS: Not identified  Lymphovascular invasion: Not identified   Comment:  The invasive carcinomas in A and B differ in grade; A is grade 3 due to  higher mitotic rate. There are also differences in growth patterns with  a more nested and solid pattern in B.   Final histologic grades could be established on a pre-treatment surgical  specimen if it becomes available.   ER/PR/HER2: Immunohistochemistry will be performed on both specimens.  Blocks A1 and B1 will be sent for testing, with reflex to [facility] for HER2  2+. The results will be reported in an addendum.   GROSS DESCRIPTION:  A. Labeled: Left breast #1  12:00 5 cm from nipple  Received: Formalin  Time/date in fixative: Collected and placed in formalin 8:30 AM on  05/07/2021  Cold ischemic time: Less than 1 minute  Total fixation time: Approximately 8.75 hours  Core pieces: 3 cores and multiple additional fragments  Size: Range from 1.6-2 cm in length and 0.2 cm in diameter  Description: Received are cores and fragments of yellow fibrofatty  tissue and hemorrhage.  The additional fragments are 1 x 0.5 x 0.1 cm in  aggregate.  Ink color: Blue  Entirely submitted in cassettes 1-2 with 2 cores in cassette 1 and 1  core with the remaining fragments in cassette  2.   B. Labeled: Left breast #2 11:00 10 from cm nipple  Received: Formalin  Time/date in fixative: Collected and placed in formalin at 8:50 AM on  05/07/2021  Cold ischemic time: Less than 1 minute  Total fixation time: Approximately 8.5 hours  Core pieces: 3  Size: Range from 1.2-1.5 cm in length and 0.2 cm in diameter  Description: Received are cores of yellow and white fibrofatty tissue  with areas of hemorrhage.  Ink color: Black  Entirely submitted in 1 cassette.   RB 05/07/2021    Final Diagnosis performed by Bryan Lemma, MD.   Electronically signed  05/08/2021 1:12:18PM    Review of Systems  Constitutional: Negative for weight loss.  HENT: Negative for hearing loss.   Eyes: Negative for blurred vision.  Respiratory: Negative for shortness of breath.   Cardiovascular: Negative for chest pain.  Gastrointestinal: Negative for abdominal pain.  Musculoskeletal: Negative for falls and joint pain.  Skin: Negative for rash.  Neurological: Negative for headaches.  Psychiatric/Behavioral: Negative for depression.   Past Medical History:  Diagnosis Date   Chronic left shoulder pain    Diabetes mellitus without complication (HCC)    Hypertension    Paralysis ([place])    weakness left upper amd lower extremity   Stroke [facility])    Past Surgical History:  Procedure  Laterality Date   CAROTID PTA/STENT INTERVENTION Left 07/20/2019   Procedure: CAROTID PTA/STENT INTERVENTION;  Surgeon: Katha Cabal, MD;  Location: [place] [facility];  Service: Cardiovascular;  Laterality: Left;   ECTOPIC PREGNANCY SURGERY     Family History  Problem Relation Age of Onset   Diabetes type II Mother    Hypertension Father    Heart attack Father    Diabetes type II Brother    Social History   Socioeconomic History   Marital status: Married    Spouse name: Not on file   Number of children: Not on file   Years of education: Not on file   Highest education level: Not on file  Occupational History   Not on file  Tobacco Use   Smoking status: Former Smoker    Packs/day: 0.50    Years: 15.00    Pack years: 7.50    Types: Cigarettes    Quit date: 04/11/2019    Years since quitting: 1.9   Smokeless tobacco: Never Used  Vaping Use   Vaping Use: Never used  Substance and Sexual Activity   Alcohol use: Never   Drug use: Never   Sexual activity: Not Currently  Other Topics Concern   Not on file  Social History Narrative   No kids    Married with husband    Used to work [employer] broadcast assistant    Social Determinants of Radio broadcast assistant Strain: Low Risk    Difficulty of Paying Living Expenses: Not hard at all  Food Insecurity: Not on file  Transportation Needs: Not on file  Physical Activity: Sufficiently Active   Days of Exercise per Week: 7 days   Minutes of Exercise per Session: 30 min  Stress: Not on file  Social Connections: Not on file  Intimate Partner Violence: Not on file   Current Meds  Medication Sig   acetaminophen (TYLENOL) 325 MG tablet Take 1-2 tablets (325-650 mg total) by mouth every 4 (four) hours as needed for mild pain.   amLODipine (NORVASC) 10 MG tablet Take 1 tablet (10 mg total) by mouth daily.   aspirin  EC 81 MG EC tablet Take 1 tablet (81 mg total) by mouth daily.   atorvastatin (LIPITOR) 80 MG tablet  Take 1 tablet (80 mg total) by mouth daily.   blood glucose meter kit and supplies 1 each by Other route 4 (four) times daily. Dispense based on patient and insurance preference. Four times daily as directed. (FOR ICD-10 E10.9, E11.9).   carvedilol (COREG) 25 MG tablet Take 1 tablet (25 mg total) by mouth 2 (two) times daily with a meal.   clopidogrel (PLAVIX) 75 MG tablet Take 1 tablet (75 mg total) by mouth daily. Further refills [facility]   Continuous Blood Gluc Sensor (FREESTYLE LIBRE 2 SENSOR) MISC Use to check glucose at least every 8 hours   empagliflozin (JARDIANCE) 25 MG TABS tablet Take 1 tablet (25 mg total) by mouth daily.   lisinopril (ZESTRIL) 40 MG tablet Take 1 tablet (40 mg total) by mouth daily.   metFORMIN (GLUCOPHAGE XR) 500 MG 24 hr tablet Take 2 tablets (1,000 mg total) by mouth daily with breakfast.   potassium chloride SA (KLOR-CON M20) 20 MEQ tablet Take 1 tablet (20 mEq total) by mouth daily.   vitamin B-12 (CYANOCOBALAMIN) 1000 MCG tablet Take 1 tablet (1,000 mcg total) by mouth daily.   Vitamin D, Cholecalciferol, 50 MCG (2000 UT) CAPS Take 4,000 Units by mouth.   No Known Allergies Recent Results (from the past 2160 hour(s))  Urine Culture     Status: None   Collection Time: 01/10/21 10:05 AM   Specimen: Urine  Result Value Ref Range   MICRO NUMBER: [id number]    SPECIMEN QUALITY: Adequate    Sample Source URINE    STATUS: FINAL    Result:      Mixed genital flora isolated. These superficial bacteria are not indicative of a urinary tract infection. No further organism identification is warranted on this specimen. If clinically indicated, recollect clean-catch, mid-stream urine and transfer  immediately to Urine Culture Transport Tube.   Comp Met (CMET)     Status: Abnormal   Collection Time: 02/04/21  8:59 AM  Result Value Ref Range   Sodium 137 135 - 145 mEq/L   Potassium 4.0 3.5 - 5.1 mEq/L   Chloride 102 96 - 112 mEq/L   CO2 24 19 - 32 mEq/L    Glucose, Bld 140 (H) 70 - 99 mg/dL   BUN 18 6 - 23 mg/dL   Creatinine, Ser 0.88 0.40 - 1.20 mg/dL   Total Bilirubin 0.4 0.2 - 1.2 mg/dL   Alkaline Phosphatase 45 39 - 117 U/L   AST 13 0 - 37 U/L   ALT 13 0 - 35 U/L   Total Protein 7.3 6.0 - 8.3 g/dL   Albumin 4.6 3.5 - 5.2 g/dL   GFR 66.78 >60.00 mL/min    Comment: Calculated using the CKD-EPI Creatinine Equation (2021)   Calcium 10.0 8.4 - 10.5 mg/dL  HM DIABETES EYE EXAM     Status: None   Collection Time: 03/04/21 12:00 AM  Result Value Ref Range   HM Diabetic Eye Exam No Retinopathy No Retinopathy  HM DIABETES EYE EXAM     Status: None   Collection Time: 03/04/21 12:00 AM  Result Value Ref Range   HM Diabetic Eye Exam No Retinopathy No Retinopathy   Objective  Body mass index is 30.87 kg/m. Wt Readings from Last 3 Encounters:  04/05/21 163 lb 6.4 oz (74.1 kg)  10/11/20 165 lb 9.6 oz (75.1 kg)  07/10/20 161  lb 9.6 oz (73.3 kg)   Temp Readings from Last 3 Encounters:  04/05/21 98 F (36.7 C) (Oral)  10/11/20 98.1 F (36.7 C) (Oral)  02/15/20 (!) 97 F (36.1 C) (Temporal)   BP Readings from Last 3 Encounters:  04/05/21 (!) 150/84  03/21/21 115/68  10/11/20 118/68   Pulse Readings from Last 3 Encounters:  04/05/21 85  10/11/20 74  04/17/20 70    Physical Exam Vitals and nursing note reviewed.  Constitutional:      Appearance: Normal appearance. She is well-developed and well-groomed. She is obese.  HENT:     Head: Normocephalic and atraumatic.  Cardiovascular:     Rate and Rhythm: Normal rate and regular rhythm.     Heart sounds: Normal heart sounds. No murmur heard.   Pulmonary:     Effort: Pulmonary effort is normal.  Skin:    General: Skin is warm and dry.  Neurological:     General: No focal deficit present.     Mental Status: She is alert and oriented to person, place, and time.     Gait: Gait normal.  Psychiatric:        Attention and Perception: Attention and perception normal.        Mood  and Affect: Mood and affect normal.        Speech: Speech normal.        Behavior: Behavior normal. Behavior is cooperative.        Thought Content: Thought content normal.        Cognition and Memory: Cognition and memory normal.        Judgment: Judgment normal.     Assessment  Plan  Hypertension elevated today likely diet associated with diabetes (HCC) A1C 7.5 diet likely contributing to not at goal < 7.0 Sl elevated disc low sodium diet eats fast foods at times and had spaghetti last night  Cont meds norvasc 10 mg qd, lipitor 80 mg qhs, coreg 25 mg bid, metformin xr 1000 mg qd, lis 40 mg qd  jardiance 25 mg qd  Check labs in 6 months   stress diet compliance she is taking meds    Left breast cancer  -referred [facility]  Surgery appt sch 05/09/21 [facility]  DIAGNOSIS:  A. BREAST, LEFT, 12 O'CLOCK, 5 CM FROM NIPPLE; ULTRASOUND-GUIDED CORE  BIOPSY:  - INVASIVE MAMMARY CARCINOMA, NO SPECIAL TYPE.  Size of invasive carcinoma: 2.5 mm in this sample  Histologic grade of invasive carcinoma: Grade 3                       Glandular/tubular differentiation score: 3                       Nuclear pleomorphism score: 2                       Mitotic rate score: 3                       Total score: 8  Ductal carcinoma in situ (DCIS): Present, high-grade, associated with  calcifications  Lymphovascular invasion: Not identified   B. BREAST, LEFT, 11 O'CLOCK, 10 CM FROM NIPPLE; ULTRASOUND-GUIDED CORE  BIOPSY:  - INVASIVE MAMMARY CARCINOMA, NO SPECIAL TYPE.  Size of invasive carcinoma: 8 mm in this sample  Histologic grade of invasive carcinoma: Grade 2  Glandular/tubular differentiation score: 3                       Nuclear pleomorphism score: 2                       Mitotic rate score: 1                       Total score: 6  DCIS: Not identified  Lymphovascular invasion: Not identified   Comment:  The invasive carcinomas in A and B differ in  grade; A is grade 3 due to  higher mitotic rate. There are also differences in growth patterns with  a more nested and solid pattern in B.   Final histologic grades could be established on a pre-treatment surgical  specimen if it becomes available.   ER/PR/HER2: Immunohistochemistry will be performed on both specimens.  Blocks A1 and B1 will be sent for testing, with reflex to [facility] for HER2  2+. The results will be reported in an addendum.   GROSS DESCRIPTION:  A. Labeled: Left breast #1 12:00 5 cm from nipple  Received: Formalin  Time/date in fixative: Collected and placed in formalin 8:30 AM on  05/07/2021  Cold ischemic time: Less than 1 minute  Total fixation time: Approximately 8.75 hours  Core pieces: 3 cores and multiple additional fragments  Size: Range from 1.6-2 cm in length and 0.2 cm in diameter  Description: Received are cores and fragments of yellow fibrofatty  tissue and hemorrhage.  The additional fragments are 1 x 0.5 x 0.1 cm in  aggregate.  Ink color: Blue  Entirely submitted in cassettes 1-2 with 2 cores in cassette 1 and 1  core with the remaining fragments in cassette 2.   B. Labeled: Left breast #2 11:00 10 from cm nipple  Received: Formalin  Time/date in fixative: Collected and placed in formalin at 8:50 AM on  05/07/2021  Cold ischemic time: Less than 1 minute  Total fixation time: Approximately 8.5 hours  Core pieces: 3  Size: Range from 1.2-1.5 cm in length and 0.2 cm in diameter  Description: Received are cores of yellow and white fibrofatty tissue  with areas of hemorrhage.  Ink color: Black  Entirely submitted in 1 cassette.   RB 05/07/2021    Final Diagnosis performed by Bryan Lemma, MD.   Electronically signed  05/08/2021 1:12:18PM   HM Flu shot declines 2019 had  Tdap disc in future for now declines all vaccines as of 11/30/19   shingrix  prevnar utd pna 23 due in 1 year 09/2021 covid vx had 4/4   Mammogram 03/2019 [place] normal  per pt due 03/2020, ordered [place] &DEXA pt to call and sch disc today call and schedule  Pap out of age window no h/o abnormal  Colonoscopy remotely ? In 1980s per pt Record consider in future vs cologuard  -as of 04/05/21 declines cologuard and colonoscopy will think about this No FH colon cancer     rec D3 4000 IU daily + MVT    Dr. Melrose Nakayama 03/2020 neurology call to reschedule   Provider: Dr. Olivia Mackie McLean-Scocuzza-[facility]

## 2021-04-08 ENCOUNTER — Other Ambulatory Visit: Payer: Self-pay | Admitting: Internal Medicine

## 2021-04-08 ENCOUNTER — Telehealth: Payer: Self-pay | Admitting: Internal Medicine

## 2021-04-08 DIAGNOSIS — I10 Essential (primary) hypertension: Secondary | ICD-10-CM

## 2021-04-08 NOTE — Telephone Encounter (Signed)
Left message for patient to call back and schedule Medicare Annual Wellness Visit (AWV) in office.   If not able to come in office, please offer to do virtually or by telephone.   Due for AWVI  Please schedule at anytime with Nurse Health Advisor.   

## 2021-04-24 ENCOUNTER — Ambulatory Visit
Admission: RE | Admit: 2021-04-24 | Discharge: 2021-04-24 | Disposition: A | Discharge status: Home / Self Care | Payer: PPO | Source: Ambulatory Visit | Attending: Internal Medicine | Admitting: Internal Medicine

## 2021-04-24 ENCOUNTER — Other Ambulatory Visit: Payer: Self-pay

## 2021-04-24 DIAGNOSIS — E119 Type 2 diabetes mellitus without complications: Secondary | ICD-10-CM | POA: Diagnosis not present

## 2021-04-24 DIAGNOSIS — Z780 Asymptomatic menopausal state: Secondary | ICD-10-CM | POA: Diagnosis not present

## 2021-04-24 DIAGNOSIS — Z1231 Encounter for screening mammogram for malignant neoplasm of breast: Secondary | ICD-10-CM | POA: Diagnosis not present

## 2021-04-24 DIAGNOSIS — Z7984 Long term (current) use of oral hypoglycemic drugs: Secondary | ICD-10-CM | POA: Insufficient documentation

## 2021-04-24 DIAGNOSIS — Z7982 Long term (current) use of aspirin: Secondary | ICD-10-CM | POA: Insufficient documentation

## 2021-04-24 DIAGNOSIS — Z13820 Encounter for screening for osteoporosis: Secondary | ICD-10-CM | POA: Insufficient documentation

## 2021-04-24 DIAGNOSIS — E2839 Other primary ovarian failure: Secondary | ICD-10-CM | POA: Insufficient documentation

## 2021-04-25 ENCOUNTER — Other Ambulatory Visit: Payer: Self-pay | Admitting: Internal Medicine

## 2021-04-25 DIAGNOSIS — R921 Mammographic calcification found on diagnostic imaging of breast: Secondary | ICD-10-CM

## 2021-04-25 DIAGNOSIS — R928 Other abnormal and inconclusive findings on diagnostic imaging of breast: Secondary | ICD-10-CM | POA: Insufficient documentation

## 2021-04-25 DIAGNOSIS — N6489 Other specified disorders of breast: Secondary | ICD-10-CM

## 2021-04-25 DIAGNOSIS — N6320 Unspecified lump in the left breast, unspecified quadrant: Secondary | ICD-10-CM

## 2021-04-25 NOTE — Addendum Note (Signed)
Addended by: Orland Mustard on: 04/25/2021 05:02 PM   Modules accepted: Orders

## 2021-04-26 ENCOUNTER — Telehealth: Payer: Self-pay | Admitting: Internal Medicine

## 2021-04-26 NOTE — Telephone Encounter (Signed)
lft vm for pt to call ofc to sch Diag mammo and US.thanks

## 2021-05-02 ENCOUNTER — Other Ambulatory Visit: Payer: Self-pay

## 2021-05-02 ENCOUNTER — Ambulatory Visit
Admission: RE | Admit: 2021-05-02 | Discharge: 2021-05-02 | Disposition: A | Discharge status: Home / Self Care | Payer: PPO | Source: Ambulatory Visit | Attending: Internal Medicine | Admitting: Internal Medicine

## 2021-05-02 ENCOUNTER — Other Ambulatory Visit: Payer: Self-pay | Admitting: Internal Medicine

## 2021-05-02 DIAGNOSIS — N6321 Unspecified lump in the left breast, upper outer quadrant: Secondary | ICD-10-CM | POA: Diagnosis not present

## 2021-05-02 DIAGNOSIS — R928 Other abnormal and inconclusive findings on diagnostic imaging of breast: Secondary | ICD-10-CM

## 2021-05-02 DIAGNOSIS — N6320 Unspecified lump in the left breast, unspecified quadrant: Secondary | ICD-10-CM | POA: Insufficient documentation

## 2021-05-02 DIAGNOSIS — N6489 Other specified disorders of breast: Secondary | ICD-10-CM | POA: Diagnosis not present

## 2021-05-02 DIAGNOSIS — R921 Mammographic calcification found on diagnostic imaging of breast: Secondary | ICD-10-CM

## 2021-05-02 DIAGNOSIS — N6322 Unspecified lump in the left breast, upper inner quadrant: Secondary | ICD-10-CM | POA: Diagnosis not present

## 2021-05-02 DIAGNOSIS — R922 Inconclusive mammogram: Secondary | ICD-10-CM | POA: Diagnosis not present

## 2021-05-07 ENCOUNTER — Ambulatory Visit
Admission: RE | Admit: 2021-05-07 | Discharge: 2021-05-07 | Disposition: A | Discharge status: Home / Self Care | Payer: PPO | Source: Ambulatory Visit | Attending: Internal Medicine | Admitting: Internal Medicine

## 2021-05-07 ENCOUNTER — Other Ambulatory Visit: Payer: Self-pay

## 2021-05-07 DIAGNOSIS — N6320 Unspecified lump in the left breast, unspecified quadrant: Secondary | ICD-10-CM

## 2021-05-07 DIAGNOSIS — D0512 Intraductal carcinoma in situ of left breast: Secondary | ICD-10-CM | POA: Diagnosis not present

## 2021-05-07 DIAGNOSIS — R928 Other abnormal and inconclusive findings on diagnostic imaging of breast: Secondary | ICD-10-CM

## 2021-05-07 DIAGNOSIS — C50812 Malignant neoplasm of overlapping sites of left female breast: Secondary | ICD-10-CM | POA: Diagnosis not present

## 2021-05-07 DIAGNOSIS — C50412 Malignant neoplasm of upper-outer quadrant of left female breast: Secondary | ICD-10-CM | POA: Diagnosis not present

## 2021-05-07 HISTORY — PX: BREAST BIOPSY: SHX20

## 2021-05-08 ENCOUNTER — Encounter: Payer: Self-pay | Admitting: Internal Medicine

## 2021-05-08 DIAGNOSIS — C50919 Malignant neoplasm of unspecified site of unspecified female breast: Secondary | ICD-10-CM | POA: Insufficient documentation

## 2021-05-08 HISTORY — DX: Malignant neoplasm of unspecified site of unspecified female breast: C50.919

## 2021-05-08 NOTE — Addendum Note (Signed)
Addended by: Orland Mustard on: 05/08/2021 05:41 PM   Modules accepted: Orders

## 2021-05-08 NOTE — Progress Notes (Signed)
Navigation initiated. Patient scheduled for consult with Dr. Peyton Najjar 05/09/21 at 10:30.  Will schedule Med/Onc consult tomorrow, and notify patient.

## 2021-05-09 DIAGNOSIS — C50812 Malignant neoplasm of overlapping sites of left female breast: Secondary | ICD-10-CM | POA: Diagnosis not present

## 2021-05-09 DIAGNOSIS — C50919 Malignant neoplasm of unspecified site of unspecified female breast: Secondary | ICD-10-CM

## 2021-05-09 NOTE — Progress Notes (Signed)
Called pt 05/08/21 she has breast cancer referred to oncology and has surgery with [facility] surgery scheduled

## 2021-05-10 NOTE — Progress Notes (Signed)
Phoned patient to make her aware of Med/Onc appointment with Dr. Tasia Catchings on 05/15/21 at 3:00.

## 2021-05-15 ENCOUNTER — Telehealth: Payer: Self-pay

## 2021-05-15 ENCOUNTER — Inpatient Hospital Stay: Payer: PPO

## 2021-05-15 ENCOUNTER — Telehealth: Payer: Self-pay | Admitting: Pharmacist

## 2021-05-15 ENCOUNTER — Inpatient Hospital Stay: Payer: PPO | Attending: Oncology | Admitting: Oncology

## 2021-05-15 ENCOUNTER — Other Ambulatory Visit: Payer: Self-pay

## 2021-05-15 ENCOUNTER — Encounter: Payer: Self-pay | Admitting: Oncology

## 2021-05-15 VITALS — BP 131/79 | HR 87 | Temp 97.8°F | Resp 16 | Ht 62.6 in | Wt 152.1 lb

## 2021-05-15 DIAGNOSIS — C50919 Malignant neoplasm of unspecified site of unspecified female breast: Secondary | ICD-10-CM

## 2021-05-15 DIAGNOSIS — Z170 Estrogen receptor positive status [ER+]: Secondary | ICD-10-CM

## 2021-05-15 DIAGNOSIS — R531 Weakness: Secondary | ICD-10-CM | POA: Diagnosis not present

## 2021-05-15 DIAGNOSIS — Z87891 Personal history of nicotine dependence: Secondary | ICD-10-CM | POA: Diagnosis not present

## 2021-05-15 DIAGNOSIS — Z833 Family history of diabetes mellitus: Secondary | ICD-10-CM | POA: Diagnosis not present

## 2021-05-15 DIAGNOSIS — N6320 Unspecified lump in the left breast, unspecified quadrant: Secondary | ICD-10-CM

## 2021-05-15 DIAGNOSIS — C50812 Malignant neoplasm of overlapping sites of left female breast: Secondary | ICD-10-CM | POA: Diagnosis not present

## 2021-05-15 DIAGNOSIS — Z8249 Family history of ischemic heart disease and other diseases of the circulatory system: Secondary | ICD-10-CM | POA: Diagnosis not present

## 2021-05-15 DIAGNOSIS — Z7189 Other specified counseling: Secondary | ICD-10-CM

## 2021-05-15 LAB — CBC WITH DIFFERENTIAL/PLATELET
Abs Immature Granulocytes: 0.02 10*3/uL (ref 0.00–0.07)
Basophils Absolute: 0.1 10*3/uL (ref 0.0–0.1)
Basophils Relative: 1 %
Eosinophils Absolute: 0.1 10*3/uL (ref 0.0–0.5)
Eosinophils Relative: 1 %
HCT: 40.3 % (ref 36.0–46.0)
Hemoglobin: 13.7 g/dL (ref 12.0–15.0)
Immature Granulocytes: 0 %
Lymphocytes Relative: 43 %
Lymphs Abs: 3.3 10*3/uL (ref 0.7–4.0)
MCH: 31 pg (ref 26.0–34.0)
MCHC: 34 g/dL (ref 30.0–36.0)
MCV: 91.2 fL (ref 80.0–100.0)
Monocytes Absolute: 0.5 10*3/uL (ref 0.1–1.0)
Monocytes Relative: 7 %
Neutro Abs: 3.6 10*3/uL (ref 1.7–7.7)
Neutrophils Relative %: 48 %
Platelets: 199 10*3/uL (ref 150–400)
RBC: 4.42 MIL/uL (ref 3.87–5.11)
RDW: 13.4 % (ref 11.5–15.5)
WBC: 7.6 10*3/uL (ref 4.0–10.5)
nRBC: 0 % (ref 0.0–0.2)

## 2021-05-15 LAB — COMPREHENSIVE METABOLIC PANEL
ALT: 16 U/L (ref 0–44)
AST: 17 U/L (ref 15–41)
Albumin: 4.5 g/dL (ref 3.5–5.0)
Alkaline Phosphatase: 46 U/L (ref 38–126)
Anion gap: 11 (ref 5–15)
BUN: 21 mg/dL (ref 8–23)
CO2: 19 mmol/L — ABNORMAL LOW (ref 22–32)
Calcium: 9.7 mg/dL (ref 8.9–10.3)
Chloride: 110 mmol/L (ref 98–111)
Creatinine, Ser: 0.91 mg/dL (ref 0.44–1.00)
GFR, Estimated: >60 mL/min (ref >60)
Glucose, Bld: 124 mg/dL — ABNORMAL HIGH (ref 70–99)
Potassium: 3.9 mmol/L (ref 3.5–5.1)
Sodium: 140 mmol/L (ref 135–145)
Total Bilirubin: 0.4 mg/dL (ref 0.3–1.2)
Total Protein: 7.6 g/dL (ref 6.5–8.1)

## 2021-05-15 NOTE — Progress Notes (Signed)
New patient evaluation.   

## 2021-05-15 NOTE — Chronic Care Management (AMB) (Signed)
  Care Management   Note  05/15/2021 Name: Tammy Nunez MRN: [id number] DOB: Jun 16, 1951  Tammy Nunez is a 70 y.o. year old female who is a primary care patient of McLean-Scocuzza, Nino Glow, MD and is actively engaged with the care management team. I reached out to [facility] by phone today to assist with re-scheduling a follow up visit with the Pharmacist  Follow up plan: Unsuccessful telephone outreach attempt made. A HIPAA compliant phone message was left for the patient providing contact information and requesting a return call.  The care management team will reach out to the patient again over the next 5 days.  If patient returns call to provider office, please advise to call [facility] [place], [place], [place], [place] [address] Direct Dial: [phone] [email or web address] Website: [email or web address]

## 2021-05-15 NOTE — Telephone Encounter (Signed)
Received voicemail from patient asking to reschedule due to several upcoming appointments. Routing to Care Guide to outreach.

## 2021-05-16 ENCOUNTER — Ambulatory Visit: Payer: PPO

## 2021-05-16 ENCOUNTER — Encounter: Payer: Self-pay | Admitting: Oncology

## 2021-05-16 DIAGNOSIS — Z7189 Other specified counseling: Secondary | ICD-10-CM | POA: Insufficient documentation

## 2021-05-16 LAB — CANCER ANTIGEN 15-3: CA 15-3: 16.8 U/mL (ref 0.0–25.0)

## 2021-05-16 LAB — CANCER ANTIGEN 27.29: CA 27.29: 23 U/mL (ref 0.0–38.6)

## 2021-05-16 NOTE — Progress Notes (Signed)
Hematology/Oncology Consult note [facility] Telephone:([phone] [phone]   Patient Care Team: McLean-Scocuzza, Nino Glow, MD as PCP - General (Internal Medicine) De Hollingshead, [place] as Pharmacist (Pharmacist) Theodore Demark, RN as Oncology Nurse Navigator  REFERRING PROVIDER: McLean-Scocuzza, Olivia Mackie *  CHIEF COMPLAINTS/REASON FOR VISIT:  Evaluation of multifocal left breast cancer  HISTORY OF PRESENTING ILLNESS:   Tammy Nunez is a  70 y.o.  female with [place] listed below was seen in consultation at the request of  McLean-Scocuzza, Olivia Mackie *  for evaluation of multifocal  04/24/2021, screening mammogram showed large asymmetry in the superior aspect of the breast with associated calcifications and possible masses and a subtle distortions.  No suspicious findings for malignancy right breast.  05/02/2021 left breast diagnostic mammogram and ultrasound showed multiple irregular mass in the left breast. Mammogram mass 1 irregular mass associated with distortion in the upper breast at middle depth, multiple adjacent and possible continuous irregular masses, conglomeration of masses measures at least 5.6 cm.  Associated pleomorphic calcifications extending at least 5 x 3.9 x 3.7 cm Mass 2, 1 cm mass in the upper breast at the middle to posterior depth. Mass 3, 5 mm irregular mass in the upper breast at posterior depth. In total, mass 1-mass 3 span at least 8.2 cm in anterior-posterior extent.  By ultrasound  mass 1 12:00 5 cm from nipple, 2.7 x 1.5 x 4.3 cm Mass 2, 12:00 12 cm from nipple, 0.9 x 0.9 x 0.5 cm Mass 3   12:00 14 cm from nipple, 0.4 x 0.4 x 0.4 cm-uses 2.4 cm superior to mass to Mass 4   11:00  No suspicious left axillary adenopathy.   05/07/2021 -Ultrasound-guided breast biopsy Breast left 12:00 mass 5 cm from nipple biopsy showed invasive mammary carcinoma, no special type, grade 3, high-grade DCIS present, no LVI, ER 90%, PR 11-50%,  HER2 negative by IHC Breast left 11:00 mass 10 cm from nipple biopsy showed invasive mammary carcinoma, no special type, grade 2, no DCIS/LVI identified.ER 90%, PR 11-50%, HER2 negative by IHC  Patient is married has no children.  Denies any hormone replacement, oral contraceptive use, family history of breast cancer.  Denies any chest radiation. Patient has a history of stroke with chronic residual weakness of the left side.  She walks with a cane.   Review of Systems  Constitutional:  Negative for appetite change, chills, fatigue and fever.  HENT:   Negative for hearing loss and voice change.   Eyes:  Negative for eye problems.  Respiratory:  Negative for chest tightness and cough.   Cardiovascular:  Negative for chest pain.  Gastrointestinal:  Negative for abdominal distention, abdominal pain and blood in stool.  Endocrine: Negative for hot flashes.  Genitourinary:  Negative for difficulty urinating and frequency.   Musculoskeletal:  Negative for arthralgias.  Skin:  Negative for itching and rash.  Neurological:  Negative for extremity weakness.  Hematological:  Negative for adenopathy.  Psychiatric/Behavioral:  Negative for confusion.    MEDICAL HISTORY:  Past Medical History:  Diagnosis Date   Breast cancer in female [facility]) 05/08/2021   Chronic left shoulder pain    Diabetes mellitus without complication (HCC)    Hypertension    Paralysis (HCC)    weakness left upper amd lower extremity   Stroke [facility])     SURGICAL HISTORY: Past Surgical History:  Procedure Laterality Date   BREAST BIOPSY Left 05/07/2021   12:00 5cmfn vision clip path pending  BREAST BIOPSY Left 05/07/2021   11:00 10 cmfn mini cork clip path pending   CAROTID PTA/STENT INTERVENTION Left 07/20/2019   Procedure: CAROTID PTA/STENT INTERVENTION;  Surgeon: Katha Cabal, MD;  Location: [place] [facility];  Service: Cardiovascular;  Laterality: Left;   ECTOPIC PREGNANCY SURGERY      SOCIAL  HISTORY: Social History   Socioeconomic History   Marital status: Married    Spouse name: Not on file   Number of children: Not on file   Years of education: Not on file   Highest education level: Not on file  Occupational History   Not on file  Tobacco Use   Smoking status: Former    Packs/day: 0.50    Years: 15.00    Pack years: 7.50    Types: Cigarettes    Quit date: 04/11/2019    Years since quitting: 2.0   Smokeless tobacco: Never  Vaping Use   Vaping Use: Never used  Substance and Sexual Activity   Alcohol use: Never   Drug use: Never   Sexual activity: Not Currently  Other Topics Concern   Not on file  Social History Narrative   No kids    Married with husband    Used to work [employer] broadcast assistant    Social Determinants of Radio broadcast assistant Strain: Low Risk    Difficulty of Paying Living Expenses: Not hard at all  Food Insecurity: Not on file  Transportation Needs: Not on file  Physical Activity: Sufficiently Active   Days of Exercise per Week: 7 days   Minutes of Exercise per Session: 30 min  Stress: Not on file  Social Connections: Not on file  Intimate Partner Violence: Not on file    FAMILY HISTORY: Family History  Problem Relation Age of Onset   Diabetes type II Mother    Hypertension Father    Heart attack Father    Diabetes type II Brother    Breast cancer Neg Hx     ALLERGIES:  has No Known Allergies.  MEDICATIONS:  Current Outpatient Medications  Medication Sig Dispense Refill   acetaminophen (TYLENOL) 325 MG tablet Take 1-2 tablets (325-650 mg total) by mouth every 4 (four) hours as needed for mild pain.     amLODipine (NORVASC) 10 MG tablet Take 1 tablet by mouth once daily 89 tablet 0   aspirin EC 81 MG EC tablet Take 1 tablet (81 mg total) by mouth daily.     atorvastatin (LIPITOR) 80 MG tablet Take 1 tablet (80 mg total) by mouth daily. 90 tablet 3   blood glucose meter kit and supplies 1 each by Other route 4  (four) times daily. Dispense based on patient and insurance preference. Four times daily as directed. (FOR ICD-10 E10.9, E11.9). 1 each 0   carvedilol (COREG) 25 MG tablet Take 1 tablet (25 mg total) by mouth 2 (two) times daily with a meal. 180 tablet 3   clopidogrel (PLAVIX) 75 MG tablet Take 1 tablet (75 mg total) by mouth daily. Further refills [facility] neurology 90 tablet 3   Continuous Blood Gluc Sensor (FREESTYLE LIBRE 2 SENSOR) MISC Use to check glucose at least every 8 hours 6 each 3   empagliflozin (JARDIANCE) 25 MG TABS tablet Take 1 tablet (25 mg total) by mouth daily. 90 tablet 3   lisinopril (ZESTRIL) 40 MG tablet Take 1 tablet (40 mg total) by mouth daily. 90 tablet 3   metFORMIN (GLUCOPHAGE XR) 500 MG 24 hr  tablet Take 2 tablets (1,000 mg total) by mouth daily with breakfast. 180 tablet 3   potassium chloride SA (KLOR-CON M20) 20 MEQ tablet Take 1 tablet (20 mEq total) by mouth daily. 90 tablet 3   vitamin B-12 (CYANOCOBALAMIN) 1000 MCG tablet Take 1 tablet (1,000 mcg total) by mouth daily. 30 tablet 1   Vitamin D, Cholecalciferol, 50 MCG (2000 UT) CAPS Take 4,000 Units by mouth.     No current facility-administered medications for this visit.     PHYSICAL EXAMINATION: ECOG PERFORMANCE STATUS: 1 - Symptomatic but completely ambulatory Vitals:   05/15/21 1438  BP: 131/79  Pulse: 87  Resp: 16  Temp: 97.8 F (36.6 C)   Filed Weights   05/15/21 1438  Weight: 152 lb 1.6 oz (69 kg)    Physical Exam Constitutional:      General: She is not in acute distress. HENT:     Head: Normocephalic and atraumatic.  Eyes:     General: No scleral icterus. Cardiovascular:     Rate and Rhythm: Normal rate and regular rhythm.     Heart sounds: Normal heart sounds.  Pulmonary:     Effort: Pulmonary effort is normal. No respiratory distress.     Breath sounds: No wheezing.  Abdominal:     General: Bowel sounds are normal. There is no distension.     Palpations: Abdomen is soft.   Musculoskeletal:        General: No deformity. Normal range of motion.     Cervical back: Normal range of motion and neck supple.  Skin:    General: Skin is warm and dry.     Findings: No erythema or rash.  Neurological:     Mental Status: She is alert and oriented to person, place, and time. Mental status is at baseline.     Comments: Chronic left upper and lower extremity weakness.  Psychiatric:        Mood and Affect: Mood normal.   Breast exam was performed in seated and lying down position. Left breast palpable hard mass in upper half of the breast.  No palpable mass in right breast.  No palpable axillary lymphadenopathy bilaterally.     LABORATORY DATA:  I have reviewed the data as listed Lab Results  Component Value Date   WBC 7.6 05/15/2021   HGB 13.7 05/15/2021   HCT 40.3 05/15/2021   MCV 91.2 05/15/2021   PLT 199 05/15/2021   Recent Labs    01/03/21 0845 02/04/21 0859 05/15/21 1544  NA 141 137 140  K 4.4 4.0 3.9  CL 106 102 110  CO2 24 24 19*  GLUCOSE 130* 140* 124*  BUN _0 CREATININE 1.00 0.88 0.91  CALCIUM 10.2 10.0 9.7  GFRNONAA  --   --  >60  PROT 6.9 7.3 7.6  ALBUMIN 4.6 4.6 4.5  AST _1 ALT _2 ALKPHOS 46 45 46  BILITOT 0.5 0.4 0.4   Iron/TIBC/Ferritin/ %Sat    Component Value Date/Time   IRON 113 12/08/2019 1015   TIBC 317 12/08/2019 1015   FERRITIN 67 12/08/2019 1015   IRONPCTSAT 36 12/08/2019 1015      RADIOGRAPHIC STUDIES: I have personally reviewed the radiological images as listed and agreed with the findings in the report. DG Bone Density  Result Date: 04/24/2021 EXAM: DUAL X-RAY ABSORPTIOMETRY (DXA) FOR BONE MINERAL DENSITY IMPRESSION: crr Your patient Shulamit Donofrio completed a BMD test on 04/24/2021 using the [employer]  Advance DXA System (analysis version: 14.10) manufactured by [employer]. The following summarizes the results of our evaluation. PATIENT BIOGRAPHICAL: Name: Montrice, Montuori Patient ID:  [id number] Birth Date: [birth year] Height: 61.0 in. Gender: Female Exam Date: 04/24/2021 Weight: 163.4 lbs. Indications: Advanced Age, diabetic, hx stroke, POSTmenopausal Fractures: Treatments: 81 MG ASPIRIN, jardiance, metformin, plavix, Vitamin D ASSESSMENT: The BMD measured at Femur Neck Left is 0.926 g/cm2 with a T-score of -0.8. This patient is considered normal according to [facility]) criteria. Lumbar spine was not utilized due to advanced degenerative changes.The scan quality is limited by exclusionof L-spine. Site Region Measured Measured WHO Young Adult BMD Date       Age      Classification T-score DualFemur Neck Left 04/24/2021 70.2 Normal -0.8 0.926 g/cm2 Right Forearm Radius 33% 04/24/2021 70.2 Normal 0.4 0.911 g/cm2 World Health Organization [facility]) criteria for post-menopausal, Caucasian Women: Normal:       T-score at or above -1 SD Osteopenia:   T-score between -1 and -2.5 SD Osteoporosis: T-score at or below -2.5 SD RECOMMENDATIONS: 1. All patients should optimize calcium and vitamin D intake. 2. Consider FDA-approved medical therapies in postmenopausal women and men aged 76 years and older, based on the following: a. A hip or vertebral (clinical or morphometric) fracture b. T-score < -2.5 at the femoral neck or spine after appropriate evaluation to exclude secondary causes c. Low bone mass (T-score between -1.0 and -2.5 at the femoral neck or spine) and a 10-year probability of a hip fracture > 3% or a 10-year probability of a major osteoporosis-related fracture > 20% based on the [place]-adapted WHO algorithm d. Clinician judgment and/or patient preferences may indicate treatment for people with 10-year fracture probabilities above or below these levels FOLLOW-UP: People with diagnosed cases of osteoporosis or at high risk for fracture should have regular bone mineral density tests. For patients eligible for Medicare, routine testing is allowed once every 2 years. The testing  frequency can be increased to one year for patients who have rapidly progressing disease, those who are receiving or discontinuing medical therapy to restore bone mass, or have additional risk factors. I have reviewed this report, and agree with the above findings. [facility] Radiology Electronically Signed   By: Lowella Grip III M.D.   On: 04/24/2021 11:36   US BREAST LTD UNI LEFT INC AXILLA  Result Date: 05/02/2021 CLINICAL DATA:  Callback for LEFT breast masses with calcifications and distortion EXAM: DIGITAL DIAGNOSTIC UNILATERAL LEFT MAMMOGRAM WITH TOMOSYNTHESIS AND CAD; ULTRASOUND LEFT BREAST LIMITED TECHNIQUE: Left digital diagnostic mammography and breast tomosynthesis was performed. The images were evaluated with computer-aided detection.; Targeted ultrasound examination of the left breast was performed COMPARISON:  Previous baseline exam. ACR Breast Density Category c: The breast tissue is heterogeneously dense, which may obscure small masses. FINDINGS: Spot compression tomosynthesis views confirm persistence of an irregular mass with associated architectural distortion in the upper breast at middle depth (mass 1). There are multiple adjacent and possibly contiguous irregular masses. This conglomeration of masses measures at least 5.6 cm. Spot magnification views demonstrated associated segmental pleomorphic calcifications. These extend at least 5.0 by 3.9 by 3.7 cm. There are several adjacent separate masses extending posteriorly from the dominant conglomeration of masses. Mass 2: There is a 1 cm mass in the upper breast at middle to posterior depth (ML slice 23). Mass 3: There is a 5 mm irregular mass in the upper breast at posterior depth (ML slice 23). In total, mass 1 through  mass 3 span at least 8.2 cm in anterior-posterior extent. On physical exam, there is a hard mass in the upper breast. Targeted ultrasound was performed of the upper breast. There are multiple masses as follows. Mass 1:  There is a conglomeration of masses at 12 o'clock 5 cm from the nipple. It is irregular with heterogeneous internal echotexture. Exact measurements are difficult to ascertain due to heterogeneous interconnected appearance of multiple irregular masses but is estimated to span at least 2.7 x 1.5 by 4.3 cm. Mass 2: At 12 o'clock 12 cm from the nipple, there is an irregular mass with irregular margins. It measures 9 by 9 x 5 mm and is distinct from the conglomeration of masses. This corresponds to mass 2 described on mammogram. Mass 3: At 12 o'clock 14 cm from the nipple, there is an irregular hypoechoic mass. It measures 4 x 4 by 4 mm. It is a distinct mass that is approximately 2.4 cm superior from mass 2. This corresponds to mass 3 described on mammogram. Mass 4: Adjacent to the interconnected conglomeration of masses is an additional additional mass at 12 o'clock 10 cm from the nipple which is irregular and hypoechoic in appearance. It measures 14 x 7 x 11 mm. It is unclear whether directly connected to the dominant mass. Targeted ultrasound was performed of the LEFT axilla. No suspicious axillary lymph nodes are seen. IMPRESSION: 1. There are multiple irregular masses in the LEFT upper breast which span approximately 8.2 cm in total which are highly concerning for malignancy. Dominant conglomeration of masses measures at least 5.6 cm mammographically and demonstrates associated pleomorphic calcifications. Recommend ultrasound-guided biopsy of this dominant conglomeration of masses (12 o'clock 5 cm from the nipple; mass 1). 2. There are at least 2 adjacent distinctly separate masses which likely reflect satellite lesions. Recommend ultrasound-guided biopsy of 1 of these masses to demonstrate extent of disease. Mass 2 (12 o'clock 12 cm from the nipple) is likely the most amenable for ultrasound-guided biopsy. 3. No suspicious LEFT axillary adenopathy. RECOMMENDATION: LEFT breast ultrasound-guided biopsy x2 I have  discussed the findings and recommendations with the patient. If applicable, a reminder letter will be sent to the patient regarding the next appointment. Patient will be scheduled for biopsy at her earliest convenience by the schedulers and ordering provider will be notified. BI-RADS CATEGORY  5: Highly suggestive of malignancy. Electronically Signed   By: Valentino Saxon MD   On: 05/02/2021 12:35  MM DIAG BREAST TOMO UNI LEFT  Result Date: 05/02/2021 CLINICAL DATA:  Callback for LEFT breast masses with calcifications and distortion EXAM: DIGITAL DIAGNOSTIC UNILATERAL LEFT MAMMOGRAM WITH TOMOSYNTHESIS AND CAD; ULTRASOUND LEFT BREAST LIMITED TECHNIQUE: Left digital diagnostic mammography and breast tomosynthesis was performed. The images were evaluated with computer-aided detection.; Targeted ultrasound examination of the left breast was performed COMPARISON:  Previous baseline exam. ACR Breast Density Category c: The breast tissue is heterogeneously dense, which may obscure small masses. FINDINGS: Spot compression tomosynthesis views confirm persistence of an irregular mass with associated architectural distortion in the upper breast at middle depth (mass 1). There are multiple adjacent and possibly contiguous irregular masses. This conglomeration of masses measures at least 5.6 cm. Spot magnification views demonstrated associated segmental pleomorphic calcifications. These extend at least 5.0 by 3.9 by 3.7 cm. There are several adjacent separate masses extending posteriorly from the dominant conglomeration of masses. Mass 2: There is a 1 cm mass in the upper breast at middle to posterior depth (ML slice 23). Mass 3:  There is a 5 mm irregular mass in the upper breast at posterior depth (ML slice 23). In total, mass 1 through mass 3 span at least 8.2 cm in anterior-posterior extent. On physical exam, there is a hard mass in the upper breast. Targeted ultrasound was performed of the upper breast. There are  multiple masses as follows. Mass 1: There is a conglomeration of masses at 12 o'clock 5 cm from the nipple. It is irregular with heterogeneous internal echotexture. Exact measurements are difficult to ascertain due to heterogeneous interconnected appearance of multiple irregular masses but is estimated to span at least 2.7 x 1.5 by 4.3 cm. Mass 2: At 12 o'clock 12 cm from the nipple, there is an irregular mass with irregular margins. It measures 9 by 9 x 5 mm and is distinct from the conglomeration of masses. This corresponds to mass 2 described on mammogram. Mass 3: At 12 o'clock 14 cm from the nipple, there is an irregular hypoechoic mass. It measures 4 x 4 by 4 mm. It is a distinct mass that is approximately 2.4 cm superior from mass 2. This corresponds to mass 3 described on mammogram. Mass 4: Adjacent to the interconnected conglomeration of masses is an additional additional mass at 12 o'clock 10 cm from the nipple which is irregular and hypoechoic in appearance. It measures 14 x 7 x 11 mm. It is unclear whether directly connected to the dominant mass. Targeted ultrasound was performed of the LEFT axilla. No suspicious axillary lymph nodes are seen. IMPRESSION: 1. There are multiple irregular masses in the LEFT upper breast which span approximately 8.2 cm in total which are highly concerning for malignancy. Dominant conglomeration of masses measures at least 5.6 cm mammographically and demonstrates associated pleomorphic calcifications. Recommend ultrasound-guided biopsy of this dominant conglomeration of masses (12 o'clock 5 cm from the nipple; mass 1). 2. There are at least 2 adjacent distinctly separate masses which likely reflect satellite lesions. Recommend ultrasound-guided biopsy of 1 of these masses to demonstrate extent of disease. Mass 2 (12 o'clock 12 cm from the nipple) is likely the most amenable for ultrasound-guided biopsy. 3. No suspicious LEFT axillary adenopathy. RECOMMENDATION: LEFT breast  ultrasound-guided biopsy x2 I have discussed the findings and recommendations with the patient. If applicable, a reminder letter will be sent to the patient regarding the next appointment. Patient will be scheduled for biopsy at her earliest convenience by the schedulers and ordering provider will be notified. BI-RADS CATEGORY  5: Highly suggestive of malignancy. Electronically Signed   By: Valentino Saxon MD   On: 05/02/2021 12:35  MM 3D SCREEN BREAST BILATERAL  Result Date: 04/25/2021 CLINICAL DATA:  Screening. EXAM: DIGITAL SCREENING BILATERAL MAMMOGRAM WITH TOMOSYNTHESIS AND CAD TECHNIQUE: Bilateral screening digital craniocaudal and mediolateral oblique mammograms were obtained. Bilateral screening digital breast tomosynthesis was performed. The images were evaluated with computer-aided detection. COMPARISON:  None.  Baseline. ACR Breast Density Category c: The breast tissue is heterogeneously dense, which may obscure small masses. FINDINGS: In the left breast, a large asymmetry in the superior aspect of the breast with associated calcifications and possible masses and subtle distortion warrants further evaluation. In the right breast, no findings suspicious for malignancy. IMPRESSION: Further evaluation is suggested for possible asymmetry with associated calcifications and possible masses and subtle distortion in the left breast. RECOMMENDATION: Diagnostic mammogram and possibly ultrasound of the left breast. (Code:[id number]-L-21M) The patient will be contacted regarding the findings, and additional imaging will be scheduled. BI-RADS CATEGORY  0: Incomplete. Need additional  imaging evaluation and/or prior mammograms for comparison. Electronically Signed   By: Claudie Revering M.D.   On: 04/25/2021 15:05   MM CLIP PLACEMENT LEFT  Result Date: 05/07/2021 CLINICAL DATA:  Evaluate post biopsy marker clip placements following ultrasound-guided core needle biopsy of 2 left breast masses. EXAM: DIAGNOSTIC LEFT  MAMMOGRAM POST ULTRASOUND BIOPSY COMPARISON:  Previous exam(s). FINDINGS: Mammographic images were obtained following ultrasound guided biopsy of 2 left breast masses. The vision biopsy clip lies within the larger mass, surrounded by pleomorphic calcifications, and the cylinder/mini Cork biopsy clip lies in the posterior mass. IMPRESSION: Appropriate positioning of the venous and mini Cork shaped biopsy marking clip at the site of biopsy in the upper left breast as detailed. Final Assessment: Post Procedure Mammograms for Marker Placement Electronically Signed   By: Lajean Manes M.D.   On: 05/07/2021 09:12  [place] LT BREAST BX W LOC DEV 1ST LESION IMG BX SPEC US GUIDE  Addendum Date: 05/10/2021   ADDENDUM REPORT: 05/10/2021 12:41 ADDENDUM: PATHOLOGY revealed: Site A. BREAST, LEFT, 12 O'CLOCK, 5 CM FROM NIPPLE; ULTRASOUND-GUIDED CORE BIOPSY: - INVASIVE MAMMARY CARCINOMA, NO SPECIAL TYPE. Size of invasive carcinoma: 2.5 mm in this sample. Histologic grade of invasive carcinoma: Grade 3. Ductal carcinoma in situ (DCIS): Present, high-grade, associated with calcifications. Lymphovascular invasion: Not identified Pathology results are CONCORDANT with imaging findings, per Dr. Lajean Manes. PATHOLOGY revealed: Site B. BREAST, LEFT, 11 O'CLOCK, 10 CM FROM NIPPLE; ULTRASOUND-GUIDED CORE BIOPSY: - INVASIVE MAMMARY CARCINOMA, NO SPECIAL TYPE. Size of invasive carcinoma: 8 mm in this sample. Histologic grade of invasive carcinoma: Grade 2. DCIS: Not identified. Lymphovascular invasion: Not identified. Comment: The invasive carcinomas in A and B differ in grade; A is grade 3 due to higher mitotic rate. There are also differences in growth patterns with a more nested and solid pattern in B. Pathology results are CONCORDANT with imaging findings, per Dr. Lajean Manes. Pathology results and recommendations below were discussed with patient by telephone on 05/08/2021. Patient reported biopsy site within normal limits with slight  tenderness at the site. Post biopsy care instructions were reviewed, questions were answered and my direct phone number was provided to patient. Patient was instructed to call [facility] if any concerns or questions arise related to the biopsy. Recommendations: 1. Surgical consultation for both sites (A and B). Request for surgical consultation relayed to Al Pimple RN and Tanya Nones RN at [facility] by Electa Sniff RN 05/08/2021. 2. Consider bilateral breast MRI due to high grade DCIS and to evaluate extent of breast disease given additional masses identified on diagnostic mammogram. Pathology results reported by Electa Sniff RN on 05/10/2021. Electronically Signed   By: Lajean Manes M.D.   On: 05/10/2021 12:41   Result Date: 05/10/2021 CLINICAL DATA:  Patient presents for biopsy of 2 left breast masses, part of a conglomerate of masses spanning over 8 cm in the left breast, associated with pleomorphic calcifications. EXAM: ULTRASOUND GUIDED LEFT BREAST CORE NEEDLE BIOPSY: 2 MASSES BIOPSIED COMPARISON:  Previous exam(s). PROCEDURE: I met with the patient and we discussed the procedure of ultrasound-guided biopsy, including benefits and alternatives. We discussed the high likelihood of a successful procedure. We discussed the risks of the procedure, including infection, bleeding, tissue injury, clip migration, and inadequate sampling. Informed written consent was given. The usual time-out protocol was performed immediately prior to the procedure. Lesion #1: 12 o'clock, 5 cmfn. Lesion quadrant: Upper outer quadrant Using sterile technique and 1% Lidocaine as local  anesthetic, under direct ultrasound visualization, a 12 gauge spring-loaded device was used to perform biopsy of the 4.3 cm mass using a inferomedial approach. At the conclusion of the procedure a vision tissue marker clip was deployed into the biopsy cavity. Lesion #2: 11 o'clock, 10 cmfn. Lesion quadrant: Upper outer  quadrant Using sterile technique and 1% Lidocaine as local anesthetic, under direct ultrasound visualization, a 14 gauge spring-loaded device was used to perform biopsy of the 9 mm mass using a inferior approach. At the conclusion of the procedure mini Cork shaped tissue marker clip was deployed into the biopsy cavity. Follow up 2 view mammogram was performed and dictated separately. IMPRESSION: Ultrasound guided biopsy of 2 left breast masses. No apparent complications. Electronically Signed: By: Lajean Manes M.D. On: 05/07/2021 09:10   [place] LT BREAST BX W LOC DEV EA ADD LESION IMG BX SPEC US GUIDE  Addendum Date: 05/10/2021   ADDENDUM REPORT: 05/10/2021 12:41 ADDENDUM: PATHOLOGY revealed: Site A. BREAST, LEFT, 12 O'CLOCK, 5 CM FROM NIPPLE; ULTRASOUND-GUIDED CORE BIOPSY: - INVASIVE MAMMARY CARCINOMA, NO SPECIAL TYPE. Size of invasive carcinoma: 2.5 mm in this sample. Histologic grade of invasive carcinoma: Grade 3. Ductal carcinoma in situ (DCIS): Present, high-grade, associated with calcifications. Lymphovascular invasion: Not identified Pathology results are CONCORDANT with imaging findings, per Dr. Lajean Manes. PATHOLOGY revealed: Site B. BREAST, LEFT, 11 O'CLOCK, 10 CM FROM NIPPLE; ULTRASOUND-GUIDED CORE BIOPSY: - INVASIVE MAMMARY CARCINOMA, NO SPECIAL TYPE. Size of invasive carcinoma: 8 mm in this sample. Histologic grade of invasive carcinoma: Grade 2. DCIS: Not identified. Lymphovascular invasion: Not identified. Comment: The invasive carcinomas in A and B differ in grade; A is grade 3 due to higher mitotic rate. There are also differences in growth patterns with a more nested and solid pattern in B. Pathology results are CONCORDANT with imaging findings, per Dr. Lajean Manes. Pathology results and recommendations below were discussed with patient by telephone on 05/08/2021. Patient reported biopsy site within normal limits with slight tenderness at the site. Post biopsy care instructions were reviewed,  questions were answered and my direct phone number was provided to patient. Patient was instructed to call [facility] if any concerns or questions arise related to the biopsy. Recommendations: 1. Surgical consultation for both sites (A and B). Request for surgical consultation relayed to Al Pimple RN and Tanya Nones RN at [facility] by Electa Sniff RN 05/08/2021. 2. Consider bilateral breast MRI due to high grade DCIS and to evaluate extent of breast disease given additional masses identified on diagnostic mammogram. Pathology results reported by Electa Sniff RN on 05/10/2021. Electronically Signed   By: Lajean Manes M.D.   On: 05/10/2021 12:41   Result Date: 05/10/2021 CLINICAL DATA:  Patient presents for biopsy of 2 left breast masses, part of a conglomerate of masses spanning over 8 cm in the left breast, associated with pleomorphic calcifications. EXAM: ULTRASOUND GUIDED LEFT BREAST CORE NEEDLE BIOPSY: 2 MASSES BIOPSIED COMPARISON:  Previous exam(s). PROCEDURE: I met with the patient and we discussed the procedure of ultrasound-guided biopsy, including benefits and alternatives. We discussed the high likelihood of a successful procedure. We discussed the risks of the procedure, including infection, bleeding, tissue injury, clip migration, and inadequate sampling. Informed written consent was given. The usual time-out protocol was performed immediately prior to the procedure. Lesion #1: 12 o'clock, 5 cmfn. Lesion quadrant: Upper outer quadrant Using sterile technique and 1% Lidocaine as local anesthetic, under direct ultrasound visualization, a 12 gauge spring-loaded device  was used to perform biopsy of the 4.3 cm mass using a inferomedial approach. At the conclusion of the procedure a vision tissue marker clip was deployed into the biopsy cavity. Lesion #2: 11 o'clock, 10 cmfn. Lesion quadrant: Upper outer quadrant Using sterile technique and 1% Lidocaine as local anesthetic,  under direct ultrasound visualization, a 14 gauge spring-loaded device was used to perform biopsy of the 9 mm mass using a inferior approach. At the conclusion of the procedure mini Cork shaped tissue marker clip was deployed into the biopsy cavity. Follow up 2 view mammogram was performed and dictated separately. IMPRESSION: Ultrasound guided biopsy of 2 left breast masses. No apparent complications. Electronically Signed: By: Lajean Manes M.D. On: 05/07/2021 09:10      ASSESSMENT & PLAN:  1. Invasive carcinoma of breast ([place])   2. Mass of multiple sites of left breast    #Left breast invasive carcinoma, multiple sites.  ER 90%, PR 11-50% positive, HER2 negative by [facility] Image findings were reviewed and discussed with patient.  Pathology was reviewed and discussed. Recommend patient to have MRI breast done for further evaluation of the disease extent. Multifocal versus large left breast mass. Discussed with patient about possibilities of additional biopsy if needed. Patient has establish care with surgeon Dr. Peyton Najjar.  She probably will need a total mastectomy but lumpectomy may be feasible if patient really desires. Further management plan pending MRI results. If patient is willing to proceed with total mastectomy, I do not feel neoadjuvant chemotherapy is going to significant benefit her.  She can proceed with surgery.  I will obtain Oncotype DX on the biopsy sample.  Addendum 05/22/2021 MRI breast showed that nonmass like malignancy spanning over 8 cm, abuting anterior aspect of pectoralis muscle. Discussed with Dr.Cintron and we agree that she will need mastectomy. There is concern of possible positive margin. I recommend neoadjuvant chemotherapy, plan weekly Taxol followed by ddAC.  Will add Ki-67 staining, check Oncotype Dx. Check ECHO and arrange her to get chemotherapy education.   Plan Lab MD weekly taxol to start in the near future. Discussed the plan with patient and she agrees.  Rationale and potential side effects were discussed with her.   Orders Placed This Encounter  Procedures   MR BREAST BILATERAL W WO CONTRAST INC CAD    Standing Status:   Future    Standing Expiration Date:   05/15/2022    Order Specific Question:   If indicated for the ordered procedure, I authorize the administration of contrast media per Radiology protocol    Answer:   Yes    Order Specific Question:   What is the patient's sedation requirement?    Answer:   No Sedation    Order Specific Question:   Does the patient have a pacemaker or implanted devices?    Answer:   No    Order Specific Question:   Radiology Contrast Protocol - do NOT remove file path    Answer:   [email or web address]    Order Specific Question:   Preferred imaging location?    Answer:   [facility] (table limit - 550lbs)   CBC with Differential/Platelet    Standing Status:   Future    Number of Occurrences:   1    Standing Expiration Date:   05/15/2022   Comprehensive metabolic panel    Standing Status:   Future    Number of Occurrences:   1    Standing Expiration Date:   05/15/2022  Cancer antigen 15-3    Standing Status:   Future    Number of Occurrences:   1    Standing Expiration Date:   05/15/2022   Cancer antigen 27.29    Standing Status:   Future    Number of Occurrences:   1    Standing Expiration Date:   05/15/2022   Ambulatory referral to Genetics    Referral Priority:   Routine    Referral Type:   Consultation    Referral Reason:   Specialty Services Required    Referred to Provider:   Faith Rogue T    Number of Visits Requested:   1    All questions were answered. The patient knows to call the clinic with any problems questions or concerns.  cc McLean-Scocuzza, Olivia Mackie *    Return of visit: To be determined. Thank you for this kind referral and the opportunity to participate in the care of this patient. A copy of today's note is routed to  referring provider    [name], MD, PhD Hematology Oncology [facility] Pager- [phone] 05/16/2021

## 2021-05-20 NOTE — Chronic Care Management (AMB) (Signed)
  Care Management   Note  05/20/2021 Name: Tammy Nunez MRN: [id number] DOB: [birth year]  Tammy Nunez is a 70 y.o. year old female who is a primary care patient of McLean-Scocuzza, Nino Glow, MD and is actively engaged with the care management team. I reached out to [facility] by phone today to assist with re-scheduling a follow up visit with the Pharmacist  Follow up plan: Unsuccessful telephone outreach attempt made. A HIPAA compliant phone message was left for the patient providing contact information and requesting a return call.  The care management team will reach out to the patient again over the next 7 days.  If patient returns call to provider office, please advise to call [facility]  at [place], [place], [place], [place] [address] Direct Dial: [phone] [email or web address] Website: [email or web address]

## 2021-05-22 ENCOUNTER — Other Ambulatory Visit: Payer: Self-pay

## 2021-05-22 ENCOUNTER — Ambulatory Visit
Admission: RE | Admit: 2021-05-22 | Discharge: 2021-05-22 | Disposition: A | Discharge status: Home / Self Care | Payer: PPO | Source: Ambulatory Visit | Attending: Oncology | Admitting: Oncology

## 2021-05-22 DIAGNOSIS — C50919 Malignant neoplasm of unspecified site of unspecified female breast: Secondary | ICD-10-CM | POA: Diagnosis not present

## 2021-05-22 DIAGNOSIS — C50912 Malignant neoplasm of unspecified site of left female breast: Secondary | ICD-10-CM | POA: Diagnosis not present

## 2021-05-22 MED ORDER — GADOBUTROL 1 MMOL/ML IV SOLN
6.0000 mL | Freq: Once | INTRAVENOUS | Status: AC | PRN
  Administered 2021-05-22: 6 mL via INTRAVENOUS

## 2021-05-28 ENCOUNTER — Other Ambulatory Visit: Payer: Self-pay | Admitting: Oncology

## 2021-05-28 NOTE — Progress Notes (Signed)
Supported patient , and husband at initial visit with Dr. Tasia Catchings.  Plans for MRI .

## 2021-05-28 NOTE — Progress Notes (Signed)
START ON PATHWAY REGIMEN - Breast     Cycles 1 through 4: A cycle is every 14 days:     Doxorubicin      Cyclophosphamide      Pegfilgrastim-xxxx    Cycles 5 through 16: A cycle is every 7 days:     Paclitaxel   **Always confirm dose/schedule in your pharmacy ordering system**  Patient Characteristics: Preoperative or Nonsurgical Candidate (Clinical Staging), Neoadjuvant Therapy followed by Surgery, Invasive Disease, Chemotherapy, HER2 Negative/Unknown/Equivocal, ER Positive Therapeutic Status: Preoperative or Nonsurgical Candidate (Clinical Staging) AJCC M Category: cM0 AJCC Grade: G3 Breast Surgical Plan: Neoadjuvant Therapy followed by Surgery ER Status: Positive (+) AJCC 8 Stage Grouping: IIB HER2 Status: Negative (-) AJCC T Category: cT3 AJCC N Category: cN0 PR Status: Positive (+) Intent of Therapy: Curative Intent, Discussed with Patient

## 2021-05-29 ENCOUNTER — Telehealth: Payer: Self-pay

## 2021-05-29 ENCOUNTER — Ambulatory Visit: Payer: PPO

## 2021-05-29 NOTE — Telephone Encounter (Signed)
Request for OncotypeDx submitted online on specimen: [id number] Order: [id number]

## 2021-05-30 NOTE — Chronic Care Management (AMB) (Signed)
  Care Management   Note  05/30/2021 Name: Tammy Nunez MRN: [id number] DOB: 31-Dec-1950  Tammy Nunez is a 70 y.o. year old female who is a primary care patient of McLean-Scocuzza, Nino Glow, MD and is actively engaged with the care management team. I reached out to [facility] by phone today to assist with re-scheduling a follow up visit with the Pharmacist  Follow up plan: Telephone appointment with care management team member scheduled for:07/01/2021  Noreene Larsson, [place], [place], [place] [address] Direct Dial: [phone] [email or web address] Website: [email or web address]

## 2021-05-30 NOTE — Telephone Encounter (Signed)
Patient has been rescheduled.

## 2021-06-03 ENCOUNTER — Telehealth: Payer: Self-pay

## 2021-06-03 ENCOUNTER — Telehealth: Payer: Self-pay | Admitting: Oncology

## 2021-06-03 DIAGNOSIS — C50919 Malignant neoplasm of unspecified site of unspecified female breast: Secondary | ICD-10-CM

## 2021-06-03 DIAGNOSIS — Z8673 Personal history of transient ischemic attack (TIA), and cerebral infarction without residual deficits: Secondary | ICD-10-CM | POA: Diagnosis not present

## 2021-06-03 DIAGNOSIS — R531 Weakness: Secondary | ICD-10-CM | POA: Diagnosis not present

## 2021-06-03 DIAGNOSIS — R200 Anesthesia of skin: Secondary | ICD-10-CM

## 2021-06-03 HISTORY — DX: Anesthesia of skin: R20.0

## 2021-06-03 NOTE — Telephone Encounter (Signed)
Please schedule patient for  2d echo and chemo education ddAC / taxol and notify pt of appt details. Thanks

## 2021-06-03 NOTE — Telephone Encounter (Signed)
Scheduled pt for Echo and Chemo class per scheduling message sent on 7/11. Pt did not answer. VM was left for patient to contact office for scheduled appt details.

## 2021-06-05 ENCOUNTER — Other Ambulatory Visit: Payer: Self-pay

## 2021-06-05 ENCOUNTER — Ambulatory Visit: Payer: Self-pay | Admitting: General Surgery

## 2021-06-05 ENCOUNTER — Other Ambulatory Visit
Admission: RE | Admit: 2021-06-05 | Discharge: 2021-06-05 | Disposition: A | Discharge status: Home / Self Care | Payer: PPO | Source: Ambulatory Visit | Attending: General Surgery | Admitting: General Surgery

## 2021-06-05 HISTORY — DX: Cardiac arrhythmia, unspecified: I49.9

## 2021-06-05 HISTORY — DX: Other specified postprocedural states: R11.2

## 2021-06-05 HISTORY — DX: Other specified postprocedural states: Z98.890

## 2021-06-05 HISTORY — DX: Atherosclerotic heart disease of native coronary artery without angina pectoris: I25.10

## 2021-06-05 NOTE — Patient Instructions (Addendum)
Your procedure is scheduled on: 06/06/21  Report to the Registration Desk on the 1st floor of the [facility]. To find out your arrival time, please call [phone] between 1PM - 3PM on: 06/05/21  REMEMBER: Instructions that are not followed completely may result in serious medical risk, up to and including death; or upon the discretion of your surgeon and anesthesiologist your surgery may need to be rescheduled.  Do not eat food after midnight the night before surgery.  No gum chewing, lozengers or hard candies.  You may however, drink CLEAR liquids up to 2 hours before you are scheduled to arrive for your surgery. Do not drink anything within 2 hours of your scheduled arrival time,  Type 1 and Type 2 diabetics should only drink water.  TAKE THESE MEDICATIONS THE MORNING OF SURGERY WITH A SIP OF WATER: - amLODipine (NORVASC) 10 MG tablet - carvedilol (COREG) 25 MG tablet  Stop Metformin 2 days prior to surgery. Stop taking 07/13 and do not take the day of procedure.   Follow recommendations from Cardiologist, Pulmonologist or PCP regarding stopping Aspirin, Coumadin, Plavix, Eliquis, Pradaxa, or Pletal.   Plavix and Aspirin was stopped 06/04/21 per patient.  One week prior to surgery: Stop Anti-inflammatories (NSAIDS) such as Advil, Aleve, Ibuprofen, Motrin, Naproxen, Naprosyn and Aspirin based products such as Excedrin, Goodys Powder, BC Powder.  Stop ANY OVER THE COUNTER supplements until after surgery.  You may however, continue to take Tylenol if needed for pain up until the day of surgery.  No Alcohol for 24 hours before or after surgery.  No Smoking including e-cigarettes for 24 hours prior to surgery.  No chewable tobacco products for at least 6 hours prior to surgery.  No nicotine patches on the day of surgery.  Do not use any "recreational" drugs for at least a week prior to your surgery.  Please be advised that the combination of cocaine and anesthesia may have  negative outcomes, up to and including death. If you test positive for cocaine, your surgery will be cancelled.  On the morning of surgery brush your teeth with toothpaste and water, you may rinse your mouth with mouthwash if you wish. Do not swallow any toothpaste or mouthwash.  Do not wear jewelry, make-up, hairpins, clips or nail polish.  Do not wear lotions, powders, or perfumes.   Do not shave body from the neck down 48 hours prior to surgery just in case you cut yourself which could leave a site for infection.  Also, freshly shaved skin may become irritated if using the CHG soap.  Contact lenses, hearing aids and dentures may not be worn into surgery.  Do not bring valuables to the hospital. [facility] is not responsible for any missing/lost belongings or valuables.   Notify your doctor if there is any change in your medical condition (cold, fever, infection).  Wear comfortable clothing (specific to your surgery type) to the hospital.  After surgery, you can help prevent lung complications by doing breathing exercises.  Take deep breaths and cough every 1-2 hours. Your doctor may order a device called an Incentive Spirometer to help you take deep breaths. When coughing or sneezing, hold a pillow firmly against your incision with both hands. This is called "splinting." Doing this helps protect your incision. It also decreases belly discomfort.  If you are being admitted to the hospital overnight, leave your suitcase in the car. After surgery it may be brought to your room.  If you are being  discharged the day of surgery, you will not be allowed to drive home. You will need a responsible adult (18 years or older) to drive you home and stay with you that night.   If you are taking public transportation, you will need to have a responsible adult (18 years or older) with you. Please confirm with your physician that it is acceptable to use public transportation.   Please call the  [facility] at [phone] if you have any questions about these instructions.  Surgery Visitation Policy:  Patients undergoing a surgery or procedure may have one family member or support person with them as long as that person is not COVID-19 positive or experiencing its symptoms.  That person may remain in the waiting area during the procedure.  Inpatient Visitation:    Visiting hours are 7 a.m. to 8 p.m. Inpatients will be allowed two visitors daily. The visitors may change each day during the patient's stay. No visitors under the age of 7. Any visitor under the age of 4 must be accompanied by an adult. The visitor must pass COVID-19 screenings, use hand sanitizer when entering and exiting the patient's room and wear a mask at all times, including in the patient's room. Patients must also wear a mask when staff or their visitor are in the room. Masking is required regardless of vaccination status.

## 2021-06-06 ENCOUNTER — Other Ambulatory Visit: Payer: Self-pay

## 2021-06-06 ENCOUNTER — Ambulatory Visit: Payer: PPO

## 2021-06-06 ENCOUNTER — Ambulatory Visit: Payer: PPO | Admitting: Anesthesiology

## 2021-06-06 ENCOUNTER — Encounter
Admission: RE | Disposition: A | Discharge status: Home / Self Care | Payer: Self-pay | Source: Home / Self Care | Attending: General Surgery

## 2021-06-06 ENCOUNTER — Encounter: Payer: Self-pay | Admitting: General Surgery

## 2021-06-06 ENCOUNTER — Ambulatory Visit
Admission: RE | Admit: 2021-06-06 | Discharge: 2021-06-06 | Disposition: A | Discharge status: Home / Self Care | Payer: PPO | Attending: General Surgery | Admitting: General Surgery

## 2021-06-06 DIAGNOSIS — Z87891 Personal history of nicotine dependence: Secondary | ICD-10-CM | POA: Insufficient documentation

## 2021-06-06 DIAGNOSIS — Z7984 Long term (current) use of oral hypoglycemic drugs: Secondary | ICD-10-CM | POA: Insufficient documentation

## 2021-06-06 DIAGNOSIS — Z95828 Presence of other vascular implants and grafts: Secondary | ICD-10-CM

## 2021-06-06 DIAGNOSIS — Z7982 Long term (current) use of aspirin: Secondary | ICD-10-CM | POA: Insufficient documentation

## 2021-06-06 DIAGNOSIS — C50911 Malignant neoplasm of unspecified site of right female breast: Secondary | ICD-10-CM | POA: Insufficient documentation

## 2021-06-06 DIAGNOSIS — C50812 Malignant neoplasm of overlapping sites of left female breast: Secondary | ICD-10-CM | POA: Diagnosis not present

## 2021-06-06 DIAGNOSIS — I700 Atherosclerosis of aorta: Secondary | ICD-10-CM | POA: Diagnosis not present

## 2021-06-06 DIAGNOSIS — Z452 Encounter for adjustment and management of vascular access device: Secondary | ICD-10-CM | POA: Diagnosis not present

## 2021-06-06 DIAGNOSIS — F17210 Nicotine dependence, cigarettes, uncomplicated: Secondary | ICD-10-CM | POA: Diagnosis not present

## 2021-06-06 DIAGNOSIS — Z01810 Encounter for preprocedural cardiovascular examination: Secondary | ICD-10-CM | POA: Diagnosis not present

## 2021-06-06 DIAGNOSIS — Z7902 Long term (current) use of antithrombotics/antiplatelets: Secondary | ICD-10-CM | POA: Diagnosis not present

## 2021-06-06 DIAGNOSIS — Z79899 Other long term (current) drug therapy: Secondary | ICD-10-CM | POA: Insufficient documentation

## 2021-06-06 HISTORY — PX: PORTACATH PLACEMENT: SHX2246

## 2021-06-06 LAB — POCT I-STAT, CHEM 8
BUN: 20 mg/dL (ref 8–23)
Calcium, Ion: 1.32 mmol/L (ref 1.15–1.40)
Chloride: 106 mmol/L (ref 98–111)
Creatinine, Ser: 1 mg/dL (ref 0.44–1.00)
Glucose, Bld: 132 mg/dL — ABNORMAL HIGH (ref 70–99)
HCT: 45 % (ref 36.0–46.0)
Hemoglobin: 15.3 g/dL — ABNORMAL HIGH (ref 12.0–15.0)
Potassium: 4 mmol/L (ref 3.5–5.1)
Sodium: 141 mmol/L (ref 135–145)
TCO2: 21 mmol/L — ABNORMAL LOW (ref 22–32)

## 2021-06-06 LAB — GLUCOSE, CAPILLARY: Glucose-Capillary: 105 mg/dL — ABNORMAL HIGH (ref 70–99)

## 2021-06-06 SURGERY — INSERTION, TUNNELED CENTRAL VENOUS DEVICE, WITH PORT
Anesthesia: General | Site: Chest

## 2021-06-06 MED ORDER — FENTANYL CITRATE (PF) 100 MCG/2ML IJ SOLN
INTRAMUSCULAR | Status: DC | PRN
  Administered 2021-06-06: 50 ug via INTRAVENOUS

## 2021-06-06 MED ORDER — FAMOTIDINE 20 MG PO TABS
ORAL_TABLET | ORAL | Status: AC
  Administered 2021-06-06: 20 mg via ORAL
  Filled 2021-06-06: qty 1

## 2021-06-06 MED ORDER — EPHEDRINE SULFATE 50 MG/ML IJ SOLN
INTRAMUSCULAR | Status: DC | PRN
  Administered 2021-06-06: 5 mg via INTRAVENOUS
  Administered 2021-06-06 (×2): 10 mg via INTRAVENOUS
  Administered 2021-06-06: 5 mg via INTRAVENOUS

## 2021-06-06 MED ORDER — PHENYLEPHRINE HCL (PRESSORS) 10 MG/ML IV SOLN
INTRAVENOUS | Status: DC | PRN
  Administered 2021-06-06 (×6): 100 ug via INTRAVENOUS

## 2021-06-06 MED ORDER — HEPARIN SODIUM (PORCINE) 5000 UNIT/ML IJ SOLN
INTRAMUSCULAR | Status: AC
  Filled 2021-06-06: qty 1

## 2021-06-06 MED ORDER — PROPOFOL 10 MG/ML IV BOLUS
INTRAVENOUS | Status: AC
  Filled 2021-06-06: qty 20

## 2021-06-06 MED ORDER — FENTANYL CITRATE (PF) 100 MCG/2ML IJ SOLN
INTRAMUSCULAR | Status: AC
  Filled 2021-06-06: qty 2

## 2021-06-06 MED ORDER — CHLORHEXIDINE GLUCONATE 0.12 % MT SOLN: 15.0000 mL | Freq: Once | OROMUCOSAL | Status: AC

## 2021-06-06 MED ORDER — OXYCODONE HCL 5 MG/5ML PO SOLN: 5.0000 mg | Freq: Once | ORAL | Status: DC | PRN

## 2021-06-06 MED ORDER — ORAL CARE MOUTH RINSE: 15.0000 mL | Freq: Once | OROMUCOSAL | Status: AC

## 2021-06-06 MED ORDER — BUPIVACAINE-EPINEPHRINE (PF) 0.25% -1:200000 IJ SOLN
INTRAMUSCULAR | Status: DC | PRN
  Administered 2021-06-06: 10 mL

## 2021-06-06 MED ORDER — CEFAZOLIN SODIUM-DEXTROSE 2-4 GM/100ML-% IV SOLN
INTRAVENOUS | Status: AC
  Filled 2021-06-06: qty 100

## 2021-06-06 MED ORDER — SODIUM CHLORIDE 0.9 % IV SOLN
INTRAVENOUS | Status: DC | PRN
  Administered 2021-06-06: 10 mL via INTRAMUSCULAR

## 2021-06-06 MED ORDER — FAMOTIDINE 20 MG PO TABS: 20.0000 mg | ORAL_TABLET | Freq: Once | ORAL | Status: AC

## 2021-06-06 MED ORDER — APREPITANT 40 MG PO CAPS: 40.0000 mg | ORAL_CAPSULE | Freq: Once | ORAL | Status: AC

## 2021-06-06 MED ORDER — SODIUM CHLORIDE (PF) 0.9 % IJ SOLN
INTRAMUSCULAR | Status: AC
  Filled 2021-06-06: qty 50

## 2021-06-06 MED ORDER — EPHEDRINE 5 MG/ML INJ
INTRAVENOUS | Status: AC
  Filled 2021-06-06: qty 10

## 2021-06-06 MED ORDER — BUPIVACAINE-EPINEPHRINE (PF) 0.25% -1:200000 IJ SOLN
INTRAMUSCULAR | Status: AC
  Filled 2021-06-06: qty 30

## 2021-06-06 MED ORDER — SODIUM CHLORIDE 0.9 % IV SOLN: INTRAVENOUS | Status: DC

## 2021-06-06 MED ORDER — OXYCODONE HCL 5 MG PO TABS: 5.0000 mg | ORAL_TABLET | Freq: Once | ORAL | Status: DC | PRN

## 2021-06-06 MED ORDER — FENTANYL CITRATE (PF) 100 MCG/2ML IJ SOLN: 25.0000 ug | INTRAMUSCULAR | Status: DC | PRN

## 2021-06-06 MED ORDER — APREPITANT 40 MG PO CAPS
ORAL_CAPSULE | ORAL | Status: AC
  Administered 2021-06-06: 40 mg via ORAL
  Filled 2021-06-06: qty 1

## 2021-06-06 MED ORDER — SODIUM CHLORIDE (PF) 0.9 % IJ SOLN
INTRAMUSCULAR | Status: DC | PRN
  Administered 2021-06-06: 50 mL via INTRAVENOUS

## 2021-06-06 MED ORDER — CEFAZOLIN SODIUM-DEXTROSE 2-4 GM/100ML-% IV SOLN
2.0000 g | INTRAVENOUS | Status: AC
  Administered 2021-06-06: 2 g via INTRAVENOUS

## 2021-06-06 MED ORDER — PROPOFOL 500 MG/50ML IV EMUL
INTRAVENOUS | Status: DC | PRN
  Administered 2021-06-06: 200 ug/kg/min via INTRAVENOUS

## 2021-06-06 MED ORDER — CHLORHEXIDINE GLUCONATE 0.12 % MT SOLN
OROMUCOSAL | Status: AC
  Administered 2021-06-06: 15 mL via OROMUCOSAL
  Filled 2021-06-06: qty 15

## 2021-06-06 MED ORDER — HYDROCODONE-ACETAMINOPHEN 5-325 MG PO TABS: 1.0000 | ORAL_TABLET | ORAL | 0 refills | Status: AC | PRN

## 2021-06-06 SURGICAL SUPPLY — 36 items
ADH SKN CLS APL DERMABOND .7 (GAUZE/BANDAGES/DRESSINGS) ×1
APL PRP STRL LF DISP 70% ISPRP (MISCELLANEOUS) ×1
BAG DECANTER FOR FLEXI CONT (MISCELLANEOUS) ×2 IMPLANT
BLADE SURG 11 STRL SS SAFETY (MISCELLANEOUS) ×2 IMPLANT
BLADE SURG SZ11 CARB STEEL (BLADE) ×2 IMPLANT
BOOT SUTURE AID YELLOW STND (SUTURE) ×2 IMPLANT
CANISTER SUCT 1200ML W/VALVE (MISCELLANEOUS) ×2 IMPLANT
CHLORAPREP W/TINT 26 (MISCELLANEOUS) ×2 IMPLANT
COVER LIGHT HANDLE STERIS (MISCELLANEOUS) ×4 IMPLANT
DERMABOND ADVANCED (GAUZE/BANDAGES/DRESSINGS) ×1
DERMABOND ADVANCED .7 DNX12 (GAUZE/BANDAGES/DRESSINGS) ×1 IMPLANT
DRAPE C-ARM XRAY 36X54 (DRAPES) ×2 IMPLANT
ELECT REM PT RETURN 9FT ADLT (ELECTROSURGICAL) ×2
ELECTRODE REM PT RTRN 9FT ADLT (ELECTROSURGICAL) ×1 IMPLANT
GAUZE 4X4 16PLY ~~LOC~~+RFID DBL (SPONGE) ×2 IMPLANT
GLOVE SURG ENC MOIS LTX SZ6.5 (GLOVE) ×2 IMPLANT
GLOVE SURG UNDER POLY LF SZ6.5 (GLOVE) ×2 IMPLANT
GOWN STRL REUS W/ TWL LRG LVL3 (GOWN DISPOSABLE) ×3 IMPLANT
GOWN STRL REUS W/TWL LRG LVL3 (GOWN DISPOSABLE) ×6
IV NS 500ML (IV SOLUTION) ×2
IV NS 500ML BAXH (IV SOLUTION) ×1 IMPLANT
KIT PORT POWER 8FR ISP CVUE (Port) ×2 IMPLANT
KIT TURNOVER KIT A (KITS) ×2 IMPLANT
LABEL OR SOLS (LABEL) ×2 IMPLANT
MANIFOLD NEPTUNE II (INSTRUMENTS) ×2 IMPLANT
NEEDLE FILTER BLUNT 18X 1/2SAF (NEEDLE) ×1
NEEDLE FILTER BLUNT 18X1 1/2 (NEEDLE) ×1 IMPLANT
PACK PORT-A-CATH (MISCELLANEOUS) ×2 IMPLANT
SUT MNCRL AB 4-0 PS2 18 (SUTURE) ×2 IMPLANT
SUT PROLENE 2 0 FS (SUTURE) ×2 IMPLANT
SUT VIC AB 2-0 SH 27 (SUTURE) ×2
SUT VIC AB 2-0 SH 27XBRD (SUTURE) ×1 IMPLANT
SUT VIC AB 3-0 SH 27 (SUTURE) ×2
SUT VIC AB 3-0 SH 27X BRD (SUTURE) ×1 IMPLANT
SYR 10ML LL (SYRINGE) ×2 IMPLANT
SYR 3ML LL SCALE MARK (SYRINGE) ×2 IMPLANT

## 2021-06-06 NOTE — H&P (Signed)
PATIENT PROFILE: Tammy Nunez is a 70 y.o. female who presents to the OR for insertion or Port a cath   PCP:  Tammy Green, NP   HISTORY OF PRESENT ILLNESS: Tammy Nunez reports she had a "routine mammogram" as per patient.  She denies any palpable mass personally.  She denies any skin changes of her breasts.  Actually she went for a diagnostic mammogram.  Diagnostic mammogram showed a complex area on the upper portion 12:00 of the left breast.  At least 3 masses were identified.  2 of these masses were biopsied.  Core biopsy showed invasive mammary carcinoma.  These masses had 2 different grades.   Family history of breast cancer: None Family history of other cancers: None Menarche: 70 years old Menopause: In her 38s Used OCP: Denies Used estrogen and progesterone therapy: Denies History of Radiation to the chest: No previous radiation therapy Number of pregnancies: 1 ectopic pregnancy Age of ectopic pregnancy: In her 3s     PROBLEM LIST:         Problem List  Date Reviewed: 03/29/2020          Noted    Left-sided weakness 09/07/2019    History of stroke 09/07/2019         GENERAL REVIEW OF SYSTEMS:    General ROS: negative for - chills, fatigue, fever, weight gain or weight loss Allergy and Immunology ROS: negative for - hives  Hematological and Lymphatic ROS: negative for - bleeding problems or bruising, negative for palpable nodes Endocrine ROS: negative for - heat or cold intolerance, hair changes Respiratory ROS: negative for - cough, shortness of breath or wheezing Cardiovascular ROS: no chest pain or palpitations GI ROS: negative for nausea, vomiting, abdominal pain, diarrhea, constipation Musculoskeletal ROS: negative for - joint swelling or muscle pain Neurological ROS: negative for - confusion, syncope Dermatological ROS: negative for pruritus and rash Psychiatric: negative for anxiety, depression, difficulty sleeping and memory loss   MEDICATIONS: Current  Medications        Current Outpatient Medications  Medication Sig Dispense Refill   acetaminophen (TYLENOL) 325 MG tablet Take 650 mg by mouth every 4 (four) hours as needed for Pain       amLODIPine (NORVASC) 10 MG tablet Take 5 mg by mouth once daily       aspirin 81 MG EC tablet Take 81 mg by mouth once daily       atorvastatin (LIPITOR) 80 MG tablet Take 40 mg by mouth once daily       carvediloL (COREG) 25 MG tablet Take 6.25 mg by mouth 2 (two) times daily with meals       cholecalciferol, vitamin D3, 100 mcg (4,000 unit) Tab Take by mouth       clopidogreL (PLAVIX) 75 mg tablet Take 1 tablet by mouth once daily 90 tablet 0   cyanocobalamin (VITAMIN B12) 1000 MCG tablet Take 1,000 mcg by mouth once daily       empagliflozin (JARDIANCE) 25 mg tablet Take 1 tablet by mouth once daily       lisinopriL (ZESTRIL) 40 MG tablet Take 40 mg by mouth once daily       metFORMIN (GLUCOPHAGE) 500 MG tablet Take 1,000 mg by mouth daily with breakfast       potassium chloride (KLOR-CON) 20 MEQ ER tablet Take 1 tablet by mouth once daily       potassium chloride 20 mEq/15 mL oral solution Take by mouth once daily (Patient not  taking: Reported on 05/09/2021)        No current facility-administered medications for this visit.        ALLERGIES: Patient has no known allergies.   PAST MEDICAL HISTORY:     Past Medical History:  Diagnosis Date   Diabetes mellitus without complication (CMS-HCC)     History of stroke     Hyperlipidemia     Hypertension        PAST SURGICAL HISTORY:      Past Surgical History:  Procedure Laterality Date   carotid stent placment       TRANSCATHETER PLACEMENT STENT OPEN COMMON CAROTID / INNOMINATE ARTERY          FAMILY HISTORY:      Family History  Problem Relation Age of Onset   Diabetes Mother     No Known Problems Father        SOCIAL HISTORY: Social History          Socioeconomic History   Marital status: Married  Tobacco Use   Smoking  status: Former Smoker   Smokeless tobacco: Never Used  Scientific laboratory technician Use: Never used  Substance and Sexual Activity   Alcohol use: Not Currently   Drug use: Not Currently   Sexual activity: Defer        PHYSICAL EXAM:    Vitals:    05/09/21 1023  BP: 138/75  Pulse: 76    Body mass index is 30.99 kg/m. Weight: 74.4 kg (164 lb)    GENERAL: Alert, active, oriented x3   HEENT: Pupils equal reactive to light. Extraocular movements are intact. Sclera clear. Palpebral conjunctiva normal red color.Pharynx clear.   NECK: Supple with no palpable mass and no adenopathy.   LUNGS: Sound clear with no rales rhonchi or wheezes.   HEART: Regular rhythm S1 and S2 without murmur.   BREAST: Right breast without any palpable mass, skin changes, axillary adenopathy.  Left breast there is a palpable area of nodularity in the 12 o'clock position.  Patient reported that she has not felt anything there before.  No axillary adenopathy.  No nipple retraction.  No skin changes.   ABDOMEN: Soft and depressible, nontender with no palpable mass, no hepatomegaly.   EXTREMITIES: Well-developed well-nourished symmetrical with no dependent edema.   NEUROLOGICAL: Awake alert oriented, facial expression symmetrical, moving all extremities.   REVIEW OF DATA: I have reviewed the following data today: No visits with results within 3 Month(s) from this visit.  Latest known visit with results is:  No results found for any previous visit.      ASSESSMENT: Tammy Nunez is a 70 y.o. female presenting for consultation for left breast cancer.     Patient was oriented again about the pathology results. Surgical alternatives were discussed with patient including partial vs total mastectomy. Surgical technique and post operative care was discussed with patient. Risk of surgery was discussed with patient including but not limited to: wound infection, seroma, hematoma, brachial plexopathy, mondor's disease  (thrombosis of small veins of breast), chronic wound pain, breast lymphedema, altered sensation to the nipple and cosmesis among others.    Patient with multicentric left breast cancer.  2 of the 3 mass identified on mammogram and ultrasound were positive for invasive mammary carcinoma.  I would not be surprised if that there mass that was not biopsy is also invasive mammary carcinoma.  There is also always question if there is cancer in between the masses.  I had a  long discussion with the patient regarding the possibility of needing total mastectomy.  Her ER/PR/HER2/neu receptors are still pending.  This will help US guide if she needs neoadjuvant chemotherapy or upfront surgery.  If she needs upfront surgery the best alternative will be a total mastectomy blood partial mastectomy can be discussed if patient really wanted.   Patient was also oriented about the procedure of insertion of Port-A-Cath in case neoadjuvant chemotherapy is needed.  She was oriented about the risk of the surgery that includes pneumothorax, hemothorax, pain, DVT, arteriovenous fistula, infection, bleeding, among others.   Patient will meet with medical oncology next week for final evaluation and planning of treatment.   Malignant neoplasm of midline of left female breast (CMS-HCC) [C50.812]   PLAN: Insertion of Port a Cath   Patient and her husband verbalized understanding, all questions were answered, and were agreeable with the plan outlined above.        Herbert Pun, MD

## 2021-06-06 NOTE — Anesthesia Postprocedure Evaluation (Signed)
Anesthesia Post Note  Patient: Tammy Nunez  Procedure(s) Performed: INSERTION PORT-A-CATH (Chest)  Patient location during evaluation: PACU Anesthesia Type: General Level of consciousness: awake and alert Pain management: pain level controlled Vital Signs Assessment: post-procedure vital signs reviewed and stable Respiratory status: spontaneous breathing, nonlabored ventilation, respiratory function stable and patient connected to nasal cannula oxygen Cardiovascular status: blood pressure returned to baseline and stable Postop Assessment: no apparent nausea or vomiting Anesthetic complications: no   No notable events documented.   Last Vitals:  Vitals:   06/06/21 1259 06/06/21 1305  BP: (!) 153/76   Pulse: 79   Resp: 14   Temp:  (!) 36.1 C  SpO2:      Last Pain:  Vitals:   06/06/21 1305  TempSrc: Temporal  PainSc:                  Precious Haws Roann Merk

## 2021-06-06 NOTE — Transfer of Care (Signed)
Immediate Anesthesia Transfer of Care Note  Patient: Tammy Nunez  Procedure(s) Performed: INSERTION PORT-A-CATH (Chest)  Patient Location: PACU  Anesthesia Type:General  Level of Consciousness: awake, drowsy and patient cooperative  Airway & Oxygen Therapy: Patient Spontanous Breathing and Patient connected to face mask oxygen  Post-op Assessment: Report given to RN and Post -op Vital signs reviewed and stable  Post vital signs: Reviewed and stable  Last Vitals:  Vitals Value Taken Time  BP 121/60 06/06/21 1216  Temp    Pulse 80 06/06/21 1218  Resp 12 06/06/21 1218  SpO2 100 % 06/06/21 1218  Vitals shown include unvalidated device data.  Last Pain:  Vitals:   06/06/21 0944  TempSrc: Oral  PainSc: 0-No pain         Complications: No notable events documented.

## 2021-06-06 NOTE — OR Nursing (Signed)
Per Dr. Windell Moment, secure-chat, pt may resume aspirin and plavix tomorrow 06/07/21 - added to d/c instructions/med section

## 2021-06-06 NOTE — Discharge Instructions (Signed)
AMBULATORY SURGERY  DISCHARGE INSTRUCTIONS   The drugs that you were given will stay in your system until tomorrow so for the next 24 hours you should not:  Drive an automobile Make any legal decisions Drink any alcoholic beverage   You may resume regular meals tomorrow.  Today it is better to start with liquids and gradually work up to solid foods.  You may eat anything you prefer, but it is better to start with liquids, then soup and crackers, and gradually work up to solid foods.   Please notify your doctor immediately if you have any unusual bleeding, trouble breathing, redness and pain at the surgery site, drainage, fever, or pain not relieved by medication.    Additional Instructions: Call Dr. Deniece Ree office for follow up        Please contact your physician with any problems or Same Day Surgery at [phone], Monday through Friday 6 am to 4 pm, or [place] at [facility] number at [phone].

## 2021-06-06 NOTE — Op Note (Signed)
SURGICAL PROCEDURE REPORT  DATE OF PROCEDURE: 06/06/2021   SURGEON: Dr. Windell Moment   ANESTHESIA: Local with light IV sedation   PRE-OPERATIVE DIAGNOSIS: Advanced breast cancer requiring durable central venous access for chemotherapy   POST-OPERATIVE DIAGNOSIS: Advanced breast cancer requiring durable central venous access for chemotherapy   PROCEDURE(S):  1.) Percutaneous access of Right internal jugular vein under ultrasound guidance 2.) Insertion of tunneled Right internal jugular central venous catheter with subcutaneous port  INTRAOPERATIVE FINDINGS: Patent easily compressible Right internal jugular vein with appropriate respiratory variations and well-secured tunneled central venous catheter with subcutaneous port at completion of the procedure  ESTIMATED BLOOD LOSS: Minimal (<20 mL)   SPECIMENS: None   IMPLANTS: 66F tunneled Bard PowerPort central venous catheter with subcutaneous port  DRAINS: None   COMPLICATIONS: None apparent   CONDITION AT COMPLETION: Hemodynamically stable, awake   DISPOSITION: PACU   INDICATION(S) FOR PROCEDURE:  Patient is a 70 y.o. female who presented with advanced  cancer requiring durable central venous access for chemotherapy. All risks, benefits, and alternatives to above elective procedures were discussed with the patient, who elected to proceed, and informed consent was accordingly obtained at that time.  DETAILS OF PROCEDURE:  Patient was brought to the operative suite and appropriately identified. In Trendelenburg position, Right IJ venous access site was prepped and draped in the usual sterile fashion, and following a brief timeout, percutaneous Right IJ venous access was obtained under ultrasound guidance using Seldinger technique, by which local anesthetic was injected over the Right IJ vein, and access needle was inserted under direct ultrasound visualization into the Right IJ vein, through which soft guidewire was advanced, over  which access needle was withdrawn. Guidewire was secured, attention was directed to injection of local anesthetic along the planned tunnel site, 2-3 cm transverse Right chest incision was made and confirmed to accommodate the subcutaneous port, and flushed catheter was tunneled retrograde from the port site over the right chest to the right IJ access site with the attached port well-secured to the catheter and within the subcutaneous pocket. Insertion sheath was advanced over the guidewire, which was withdrawn along with the insertion sheath dilator. The catheter was introduced through the sheath and left on the [place] junction under fluoro guidance and catheter cut to desire lenght. Catheter connected to port and fixed to the pocket on two side to avoid twisting. Port was confirmed to withdraw blood and flush easily, after which concentrated heparin was instilled into the port and catheter. Dermis at the subcutaneous pocket was re-approximated using buried interrupted 3-0 Vicryl suture, and 4-0 Monocryl suture was used to re-approximate skin at the insertion and subcutaneous port sites in running subcuticular fashion for the subcutaneous port and buried interrupted fashion for the insertion site. Skin was cleaned, dried, and sterile skin glue was applied. Patient was then safely transferred to PACU for a chest x-ray. Ultrasound images are available on paper chart and Fluoroscopy guidance images are available in Epic.

## 2021-06-06 NOTE — Anesthesia Preprocedure Evaluation (Addendum)
Anesthesia Evaluation  Patient identified by MRN, date of birth, ID band Patient awake    Reviewed: Allergy & Precautions, NPO status , Patient's Chart, lab work & pertinent test results  History of Anesthesia Complications (+) PONV and history of anesthetic complications  Airway Mallampati: III  TM Distance: >3 FB Neck ROM: limited    Dental  (+) Missing   Pulmonary neg shortness of breath, former smoker,    Pulmonary exam normal        Cardiovascular Exercise Tolerance: Good hypertension, + CAD  Normal cardiovascular exam+ dysrhythmias      Neuro/Psych Seizures -,  CVA, Residual Symptoms negative psych ROS   GI/Hepatic negative GI ROS, Neg liver ROS,   Endo/Other  diabetes, Type 2  Renal/GU Renal disease  negative genitourinary   Musculoskeletal   Abdominal   Peds  Hematology negative hematology ROS (+)   Anesthesia Other Findings Past Medical History: 05/08/2021: Breast cancer in female [facility]) No date: Chronic left shoulder pain No date: Coronary artery disease No date: Diabetes mellitus without complication (HCC) No date: Dysrhythmia No date: Hypertension No date: Paralysis (HCC)     Comment:  weakness left upper amd lower extremity No date: PONV (postoperative nausea and vomiting) No date: Stroke [facility])  Past Surgical History: 05/07/2021: BREAST BIOPSY; Left     Comment:  12:00 5cmfn vision clip path pending 05/07/2021: BREAST BIOPSY; Left     Comment:  11:00 10 cmfn mini cork clip path pending 07/20/2019: CAROTID PTA/STENT INTERVENTION; Left     Comment:  Procedure: CAROTID PTA/STENT INTERVENTION;  Surgeon:               Katha Cabal, MD;  Location: [facility];               Service: Cardiovascular;  Laterality: Left; No date: ECTOPIC PREGNANCY SURGERY  BMI    Body Mass Index: 29.29 kg/m      Reproductive/Obstetrics negative OB ROS                              Anesthesia Physical Anesthesia Plan  ASA: 3  Anesthesia Plan: General   Post-op Pain Management:    Induction: Intravenous  PONV Risk Score and Plan: Propofol infusion and TIVA  Airway Management Planned: Natural Airway and Nasal Cannula  Additional Equipment:   Intra-op Plan:   Post-operative Plan:   Informed Consent: I have reviewed the patients History and Physical, chart, labs and discussed the procedure including the risks, benefits and alternatives for the proposed anesthesia with the patient or authorized representative who has indicated his/her understanding and acceptance.     Dental Advisory Given  Plan Discussed with: Anesthesiologist, CRNA and Surgeon  Anesthesia Plan Comments: (Patient consented for risks of anesthesia including but not limited to:  - adverse reactions to medications - risk of airway placement if required - damage to eyes, teeth, lips or other oral mucosa - nerve damage due to positioning  - sore throat or hoarseness - Damage to heart, brain, nerves, lungs, other parts of body or loss of life  Patient voiced understanding.)        Anesthesia Quick Evaluation

## 2021-06-11 ENCOUNTER — Inpatient Hospital Stay: Payer: PPO | Attending: Oncology

## 2021-06-11 ENCOUNTER — Other Ambulatory Visit: Payer: Self-pay

## 2021-06-11 DIAGNOSIS — Z5111 Encounter for antineoplastic chemotherapy: Secondary | ICD-10-CM | POA: Insufficient documentation

## 2021-06-11 DIAGNOSIS — Z9221 Personal history of antineoplastic chemotherapy: Secondary | ICD-10-CM | POA: Insufficient documentation

## 2021-06-11 DIAGNOSIS — Z170 Estrogen receptor positive status [ER+]: Secondary | ICD-10-CM | POA: Insufficient documentation

## 2021-06-11 DIAGNOSIS — I2510 Atherosclerotic heart disease of native coronary artery without angina pectoris: Secondary | ICD-10-CM | POA: Insufficient documentation

## 2021-06-11 DIAGNOSIS — I10 Essential (primary) hypertension: Secondary | ICD-10-CM | POA: Insufficient documentation

## 2021-06-11 DIAGNOSIS — Z7982 Long term (current) use of aspirin: Secondary | ICD-10-CM | POA: Insufficient documentation

## 2021-06-11 DIAGNOSIS — I700 Atherosclerosis of aorta: Secondary | ICD-10-CM | POA: Insufficient documentation

## 2021-06-11 DIAGNOSIS — Z803 Family history of malignant neoplasm of breast: Secondary | ICD-10-CM | POA: Insufficient documentation

## 2021-06-11 DIAGNOSIS — Z5189 Encounter for other specified aftercare: Secondary | ICD-10-CM | POA: Insufficient documentation

## 2021-06-11 DIAGNOSIS — E119 Type 2 diabetes mellitus without complications: Secondary | ICD-10-CM | POA: Insufficient documentation

## 2021-06-11 DIAGNOSIS — Z8673 Personal history of transient ischemic attack (TIA), and cerebral infarction without residual deficits: Secondary | ICD-10-CM | POA: Insufficient documentation

## 2021-06-11 DIAGNOSIS — Z7984 Long term (current) use of oral hypoglycemic drugs: Secondary | ICD-10-CM | POA: Insufficient documentation

## 2021-06-11 DIAGNOSIS — Z79899 Other long term (current) drug therapy: Secondary | ICD-10-CM | POA: Insufficient documentation

## 2021-06-11 DIAGNOSIS — C50912 Malignant neoplasm of unspecified site of left female breast: Secondary | ICD-10-CM | POA: Insufficient documentation

## 2021-06-12 ENCOUNTER — Encounter: Payer: Self-pay | Admitting: Oncology

## 2021-06-12 DIAGNOSIS — C50912 Malignant neoplasm of unspecified site of left female breast: Secondary | ICD-10-CM | POA: Diagnosis not present

## 2021-06-12 DIAGNOSIS — Z170 Estrogen receptor positive status [ER+]: Secondary | ICD-10-CM | POA: Diagnosis not present

## 2021-06-13 ENCOUNTER — Ambulatory Visit
Admission: RE | Admit: 2021-06-13 | Discharge: 2021-06-13 | Disposition: A | Discharge status: Home / Self Care | Payer: PPO | Source: Ambulatory Visit | Attending: Oncology | Admitting: Oncology

## 2021-06-13 ENCOUNTER — Other Ambulatory Visit: Payer: Self-pay

## 2021-06-13 ENCOUNTER — Encounter: Payer: Self-pay | Admitting: Oncology

## 2021-06-13 ENCOUNTER — Other Ambulatory Visit: Payer: Self-pay | Admitting: Oncology

## 2021-06-13 DIAGNOSIS — I10 Essential (primary) hypertension: Secondary | ICD-10-CM | POA: Insufficient documentation

## 2021-06-13 DIAGNOSIS — Z0189 Encounter for other specified special examinations: Secondary | ICD-10-CM | POA: Diagnosis not present

## 2021-06-13 DIAGNOSIS — I2510 Atherosclerotic heart disease of native coronary artery without angina pectoris: Secondary | ICD-10-CM | POA: Insufficient documentation

## 2021-06-13 DIAGNOSIS — E119 Type 2 diabetes mellitus without complications: Secondary | ICD-10-CM | POA: Insufficient documentation

## 2021-06-13 DIAGNOSIS — Z01818 Encounter for other preprocedural examination: Secondary | ICD-10-CM | POA: Insufficient documentation

## 2021-06-13 DIAGNOSIS — C50912 Malignant neoplasm of unspecified site of left female breast: Secondary | ICD-10-CM

## 2021-06-13 DIAGNOSIS — C50919 Malignant neoplasm of unspecified site of unspecified female breast: Secondary | ICD-10-CM

## 2021-06-13 LAB — ECHOCARDIOGRAM COMPLETE
AR max vel: 2.01 cm2
AV Area VTI: 2.47 cm2
AV Area mean vel: 2.2 cm2
AV Mean grad: 5 mmHg
AV Peak grad: 9.1 mmHg
Ao pk vel: 1.51 m/s
Area-P 1/2: 3.01 cm2
MV VTI: 2.69 cm2
S' Lateral: 2.6 cm

## 2021-06-13 MED ORDER — DEXAMETHASONE 4 MG PO TABS: 8.0000 mg | ORAL_TABLET | Freq: Every day | ORAL | 1 refills | Status: DC

## 2021-06-13 MED ORDER — PROCHLORPERAZINE MALEATE 10 MG PO TABS: 10.0000 mg | ORAL_TABLET | Freq: Four times a day (QID) | ORAL | 1 refills | Status: DC | PRN

## 2021-06-13 MED ORDER — LIDOCAINE-PRILOCAINE 2.5-2.5 % EX CREA: TOPICAL_CREAM | CUTANEOUS | 3 refills | Status: DC

## 2021-06-13 MED ORDER — ONDANSETRON HCL 8 MG PO TABS: 8.0000 mg | ORAL_TABLET | Freq: Two times a day (BID) | ORAL | 1 refills | Status: DC | PRN

## 2021-06-13 NOTE — Progress Notes (Signed)
ON PATHWAY REGIMEN - Breast  No Change  Continue With Treatment as Ordered.  Original Decision Date/Time: 05/28/2021 16:34     Cycles 1 through 4: A cycle is every 14 days:     Doxorubicin      Cyclophosphamide      [id number]    Cycles 5 through 16: A cycle is every 7 days:     Paclitaxel   **Always confirm dose/schedule in your pharmacy ordering system**  Patient Characteristics: Preoperative or Nonsurgical Candidate (Clinical Staging), Neoadjuvant Therapy followed by Surgery, Invasive Disease, Chemotherapy, HER2 Negative/Unknown/Equivocal, ER Positive Therapeutic Status: Preoperative or Nonsurgical Candidate (Clinical Staging) AJCC M Category: cM0 AJCC Grade: G3 Breast Surgical Plan: Neoadjuvant Therapy followed by Surgery ER Status: Positive (+) AJCC 8 Stage Grouping: IIB HER2 Status: Negative (-) AJCC T Category: cT3 AJCC N Category: cN0 PR Status: Positive (+) Intent of Therapy: Curative Intent, Discussed with Patient 

## 2021-06-13 NOTE — Progress Notes (Signed)
*  PRELIMINARY RESULTS* Echocardiogram 2D Echocardiogram has been performed.  Tammy Nunez 06/13/2021, 10:34 AM

## 2021-06-14 ENCOUNTER — Encounter: Payer: Self-pay | Admitting: Oncology

## 2021-06-14 ENCOUNTER — Telehealth: Payer: Self-pay

## 2021-06-14 NOTE — Telephone Encounter (Signed)
Called pt and notified her that pre meds were sent to pharmacy yesterday. Pt voiced understanding.

## 2021-06-14 NOTE — Telephone Encounter (Signed)
-----   Message from [name], MD sent at 06/13/2021  8:26 PM EDT ----- Please schedule Lab MD + ddAC + GCSF ASAP. [Next week] Has she had chemo edu yet?  Please check with LuAnn to see if Onpro can be approved.thanks.

## 2021-06-14 NOTE — Telephone Encounter (Signed)
Oncotype results scanned in Media.  

## 2021-06-14 NOTE — Telephone Encounter (Signed)
Patient had chemo edu on 7/19. Checked with Gaspar Bidding on auth for Onpro and plan does not require PA.

## 2021-06-14 NOTE — Telephone Encounter (Signed)
Pt is wanting her cream for her PORT for when she comes Friday and she I not wanting to do the GEN appt at this time.

## 2021-06-14 NOTE — Telephone Encounter (Signed)
Treatment is *new*

## 2021-06-14 NOTE — Telephone Encounter (Signed)
Colette--see above. Thanks so much.

## 2021-06-15 ENCOUNTER — Other Ambulatory Visit: Payer: Self-pay | Admitting: Oncology

## 2021-06-19 ENCOUNTER — Inpatient Hospital Stay: Payer: PPO

## 2021-06-19 ENCOUNTER — Inpatient Hospital Stay: Payer: PPO | Admitting: Licensed Clinical Social Worker

## 2021-06-21 ENCOUNTER — Inpatient Hospital Stay (HOSPITAL_BASED_OUTPATIENT_CLINIC_OR_DEPARTMENT_OTHER): Payer: PPO | Admitting: Oncology

## 2021-06-21 ENCOUNTER — Encounter: Payer: Self-pay | Admitting: Oncology

## 2021-06-21 ENCOUNTER — Inpatient Hospital Stay: Payer: PPO

## 2021-06-21 VITALS — BP 132/62 | HR 76 | Temp 97.4°F | Resp 16 | Wt 145.3 lb

## 2021-06-21 DIAGNOSIS — C50912 Malignant neoplasm of unspecified site of left female breast: Secondary | ICD-10-CM

## 2021-06-21 DIAGNOSIS — Z803 Family history of malignant neoplasm of breast: Secondary | ICD-10-CM | POA: Diagnosis not present

## 2021-06-21 DIAGNOSIS — Z8673 Personal history of transient ischemic attack (TIA), and cerebral infarction without residual deficits: Secondary | ICD-10-CM | POA: Diagnosis not present

## 2021-06-21 DIAGNOSIS — E119 Type 2 diabetes mellitus without complications: Secondary | ICD-10-CM | POA: Diagnosis not present

## 2021-06-21 DIAGNOSIS — I10 Essential (primary) hypertension: Secondary | ICD-10-CM | POA: Diagnosis not present

## 2021-06-21 DIAGNOSIS — Z7982 Long term (current) use of aspirin: Secondary | ICD-10-CM | POA: Diagnosis not present

## 2021-06-21 DIAGNOSIS — Z5189 Encounter for other specified aftercare: Secondary | ICD-10-CM | POA: Diagnosis not present

## 2021-06-21 DIAGNOSIS — I2510 Atherosclerotic heart disease of native coronary artery without angina pectoris: Secondary | ICD-10-CM | POA: Diagnosis not present

## 2021-06-21 DIAGNOSIS — Z79899 Other long term (current) drug therapy: Secondary | ICD-10-CM | POA: Diagnosis not present

## 2021-06-21 DIAGNOSIS — I700 Atherosclerosis of aorta: Secondary | ICD-10-CM | POA: Diagnosis not present

## 2021-06-21 DIAGNOSIS — Z170 Estrogen receptor positive status [ER+]: Secondary | ICD-10-CM | POA: Diagnosis not present

## 2021-06-21 DIAGNOSIS — Z5111 Encounter for antineoplastic chemotherapy: Secondary | ICD-10-CM | POA: Diagnosis not present

## 2021-06-21 DIAGNOSIS — Z9221 Personal history of antineoplastic chemotherapy: Secondary | ICD-10-CM | POA: Diagnosis not present

## 2021-06-21 DIAGNOSIS — Z7984 Long term (current) use of oral hypoglycemic drugs: Secondary | ICD-10-CM | POA: Diagnosis not present

## 2021-06-21 LAB — CBC WITH DIFFERENTIAL/PLATELET
Abs Immature Granulocytes: 0.02 10*3/uL (ref 0.00–0.07)
Basophils Absolute: 0.1 10*3/uL (ref 0.0–0.1)
Basophils Relative: 1 %
Eosinophils Absolute: 0.1 10*3/uL (ref 0.0–0.5)
Eosinophils Relative: 1 %
HCT: 42.6 % (ref 36.0–46.0)
Hemoglobin: 14 g/dL (ref 12.0–15.0)
Immature Granulocytes: 0 %
Lymphocytes Relative: 37 %
Lymphs Abs: 2.4 10*3/uL (ref 0.7–4.0)
MCH: 30.6 pg (ref 26.0–34.0)
MCHC: 32.9 g/dL (ref 30.0–36.0)
MCV: 93.2 fL (ref 80.0–100.0)
Monocytes Absolute: 0.4 10*3/uL (ref 0.1–1.0)
Monocytes Relative: 7 %
Neutro Abs: 3.6 10*3/uL (ref 1.7–7.7)
Neutrophils Relative %: 54 %
Platelets: 220 10*3/uL (ref 150–400)
RBC: 4.57 MIL/uL (ref 3.87–5.11)
RDW: 13.8 % (ref 11.5–15.5)
WBC: 6.5 10*3/uL (ref 4.0–10.5)
nRBC: 0 % (ref 0.0–0.2)

## 2021-06-21 LAB — COMPREHENSIVE METABOLIC PANEL
ALT: 16 U/L (ref 0–44)
AST: 15 U/L (ref 15–41)
Albumin: 4.7 g/dL (ref 3.5–5.0)
Alkaline Phosphatase: 49 U/L (ref 38–126)
Anion gap: 9 (ref 5–15)
BUN: 33 mg/dL — ABNORMAL HIGH (ref 8–23)
CO2: 18 mmol/L — ABNORMAL LOW (ref 22–32)
Calcium: 9.9 mg/dL (ref 8.9–10.3)
Chloride: 110 mmol/L (ref 98–111)
Creatinine, Ser: 0.94 mg/dL (ref 0.44–1.00)
GFR, Estimated: >60 mL/min (ref >60)
Glucose, Bld: 147 mg/dL — ABNORMAL HIGH (ref 70–99)
Potassium: 4.9 mmol/L (ref 3.5–5.1)
Sodium: 137 mmol/L (ref 135–145)
Total Bilirubin: 0.4 mg/dL (ref 0.3–1.2)
Total Protein: 7.6 g/dL (ref 6.5–8.1)

## 2021-06-21 MED ORDER — SODIUM CHLORIDE 0.9 % IV SOLN
150.0000 mg | Freq: Once | INTRAVENOUS | Status: AC
  Administered 2021-06-21: 150 mg via INTRAVENOUS
  Filled 2021-06-21: qty 150

## 2021-06-21 MED ORDER — HEPARIN SOD (PORK) LOCK FLUSH 100 UNIT/ML IV SOLN
500.0000 [IU] | Freq: Once | INTRAVENOUS | Status: AC | PRN
  Administered 2021-06-21: 500 [IU]
  Filled 2021-06-21: qty 5

## 2021-06-21 MED ORDER — PEGFILGRASTIM 6 MG/0.6ML ~~LOC~~ PSKT
6.0000 mg | PREFILLED_SYRINGE | Freq: Once | SUBCUTANEOUS | Status: AC
  Administered 2021-06-21: 6 mg via SUBCUTANEOUS
  Filled 2021-06-21: qty 0.6

## 2021-06-21 MED ORDER — HEPARIN SOD (PORK) LOCK FLUSH 100 UNIT/ML IV SOLN
INTRAVENOUS | Status: AC
  Filled 2021-06-21: qty 5

## 2021-06-21 MED ORDER — SODIUM CHLORIDE 0.9 % IV SOLN
600.0000 mg/m2 | Freq: Once | INTRAVENOUS | Status: AC
  Administered 2021-06-21: 1000 mg via INTRAVENOUS
  Filled 2021-06-21: qty 50

## 2021-06-21 MED ORDER — DOXORUBICIN HCL CHEMO IV INJECTION 2 MG/ML
50.0000 mg/m2 | Freq: Once | INTRAVENOUS | Status: AC
  Administered 2021-06-21: 84 mg via INTRAVENOUS
  Filled 2021-06-21: qty 25

## 2021-06-21 MED ORDER — SODIUM CHLORIDE 0.9 % IV SOLN
10.0000 mg | Freq: Once | INTRAVENOUS | Status: AC
  Administered 2021-06-21: 10 mg via INTRAVENOUS
  Filled 2021-06-21: qty 10

## 2021-06-21 MED ORDER — SODIUM CHLORIDE 0.9 % IV SOLN
Freq: Once | INTRAVENOUS | Status: AC
  Filled 2021-06-21: qty 250

## 2021-06-21 MED ORDER — PALONOSETRON HCL INJECTION 0.25 MG/5ML
0.2500 mg | Freq: Once | INTRAVENOUS | Status: AC
  Administered 2021-06-21: 0.25 mg via INTRAVENOUS
  Filled 2021-06-21: qty 5

## 2021-06-21 NOTE — Patient Instructions (Signed)
[facility]  Discharge Instructions: Thank you for choosing [facility] to provide your oncology and hematology care.  If you have a lab appointment with the [facility], please go directly to the [facility] and check in at the registration area.  Wear comfortable clothing and clothing appropriate for easy access to any Portacath or PICC line.   We strive to give you quality time with your provider. You may need to reschedule your appointment if you arrive late (15 or more minutes).  Arriving late affects you and other patients whose appointments are after yours.  Also, if you miss three or more appointments without notifying the office, you may be dismissed from the clinic at the provider's discretion.      For prescription refill requests, have your pharmacy contact our office and allow 72 hours for refills to be completed.    Today you received the following chemotherapy and/or immunotherapy agents Adriamycin and Cytoxan        To help prevent nausea and vomiting after your treatment, we encourage you to take your nausea medication as directed.  BELOW ARE SYMPTOMS THAT SHOULD BE REPORTED IMMEDIATELY: *FEVER GREATER THAN 100.4 F (38 C) OR HIGHER *CHILLS OR SWEATING *NAUSEA AND VOMITING THAT IS NOT CONTROLLED WITH YOUR NAUSEA MEDICATION *UNUSUAL SHORTNESS OF BREATH *UNUSUAL BRUISING OR BLEEDING *URINARY PROBLEMS (pain or burning when urinating, or frequent urination) *BOWEL PROBLEMS (unusual diarrhea, constipation, pain near the anus) TENDERNESS IN MOUTH AND THROAT WITH OR WITHOUT PRESENCE OF ULCERS (sore throat, sores in mouth, or a toothache) UNUSUAL RASH, SWELLING OR PAIN  UNUSUAL VAGINAL DISCHARGE OR ITCHING   Items with * indicate a potential emergency and should be followed up as soon as possible or go to the Emergency Department if any problems should occur.  Please show the CHEMOTHERAPY ALERT CARD or IMMUNOTHERAPY ALERT  CARD at check-in to the Emergency Department and triage nurse.  Should you have questions after your visit or need to cancel or reschedule your appointment, please contact [facility]  [phone] and follow the prompts.  Office hours are 8:00 a.m. to 4:30 p.m. Monday - Friday. Please note that voicemails left after 4:00 p.m. may not be returned until the following business day.  We are closed weekends and major holidays. You have access to a nurse at all times for urgent questions. Please call the main number to the clinic [phone] and follow the prompts.  For any non-urgent questions, you may also contact your provider using MyChart. We now offer e-Visits for anyone 59 and older to request care online for non-urgent symptoms. For details visit [email or web address].   Also download the MyChart app! Go to the app store, search "MyChart", open the app, select [facility], and log in with your MyChart username and password.  Due to Covid, a mask is required upon entering the hospital/clinic. If you do not have a mask, one will be given to you upon arrival. For doctor visits, patients may have 1 support person aged 90 or older with them. For treatment visits, patients cannot have anyone with them due to current Covid guidelines and our immunocompromised population.    Doxorubicin injection What is this medication? DOXORUBICIN (dox oh ROO bi sin) is a chemotherapy drug. It is used to treat many kinds of cancer like leukemia, lymphoma, neuroblastoma, sarcoma, and Wilms' tumor. It is also used to treat bladder cancer, breast cancer, lungcancer, ovarian cancer, stomach cancer, and thyroid  cancer. This medicine may be used for other purposes; ask your health care provider orpharmacist if you have questions. COMMON BRAND NAME(S): Adriamycin, Adriamycin PFS, Adriamycin RDF, Rubex What should I tell my care team before I take this medication? They need to know if you  have any of these conditions: heart disease history of low blood counts caused by a medicine liver disease recent or ongoing radiation therapy an unusual or allergic reaction to doxorubicin, other chemotherapy agents, other medicines, foods, dyes, or preservatives pregnant or trying to get pregnant breast-feeding How should I use this medication? This drug is given as an infusion into a vein. It is administered in a hospital or clinic by a specially trained health care professional. If you have pain, swelling, burning or any unusual feeling around the site of your injection,tell your health care professional right away. Talk to your pediatrician regarding the use of this medicine in children.Special care may be needed. Overdosage: If you think you have taken too much of this medicine contact apoison control center or emergency room at once. NOTE: This medicine is only for you. Do not share this medicine with others. What if I miss a dose? It is important not to miss your dose. Call your doctor or health careprofessional if you are unable to keep an appointment. What may interact with this medication? This medicine may interact with the following medications: 6-mercaptopurine paclitaxel phenytoin St. John's Wort trastuzumab verapamil This list may not describe all possible interactions. Give your health care provider a list of all the medicines, herbs, non-prescription drugs, or dietary supplements you use. Also tell them if you smoke, drink alcohol, or use illegaldrugs. Some items may interact with your medicine. What should I watch for while using this medication? This drug may make you feel generally unwell. This is not uncommon, as chemotherapy can affect healthy cells as well as cancer cells. Report any side effects. Continue your course of treatment even though you feel ill unless yourdoctor tells you to stop. There is a maximum amount of this medicine you should receive throughout  your life. The amount depends on the medical condition being treated and your overall health. Your doctor will watch how much of this medicine you receive inyour lifetime. Tell your doctor if you have taken this medicine before. You may need blood work done while you are taking this medicine. Your urine may turn red for a few days after your dose. This is not blood. Ifyour urine is dark or brown, call your doctor. In some cases, you may be given additional medicines to help with side effects.Follow all directions for their use. Call your doctor or health care professional for advice if you get a fever, chills or sore throat, or other symptoms of a cold or flu. Do not treat yourself. This drug decreases your body's ability to fight infections. Try toavoid being around people who are sick. This medicine may increase your risk to bruise or bleed. Call your doctor orhealth care professional if you notice any unusual bleeding. Talk to your doctor about your risk of cancer. You may be more at risk forcertain types of cancers if you take this medicine. Do not become pregnant while taking this medicine or for 6 months after stopping it. Women should inform their doctor if they wish to become pregnant or think they might be pregnant. Men should not father a child while taking this medicine and for 6 months after stopping it. There is a potential for serious side  effects to an unborn child. Talk to your health care professional or pharmacist for more information. Do not breast-feed an infant while takingthis medicine. This medicine has caused ovarian failure in some women and reduced sperm counts in some men This medicine may interfere with the ability to have a child. Talk with your doctor or health care professional if you are concerned about yourfertility. This medicine may cause a decrease in Co-Enzyme Q-10. You should make sure that you get enough Co-Enzyme Q-10 while you are taking this medicine. Discuss  thefoods you eat and the vitamins you take with your health care professional. What side effects may I notice from receiving this medication? Side effects that you should report to your doctor or health care professionalas soon as possible: allergic reactions like skin rash, itching or hives, swelling of the face, lips, or tongue breathing problems chest pain fast or irregular heartbeat low blood counts - this medicine may decrease the number of white blood cells, red blood cells and platelets. You may be at increased risk for infections and bleeding. pain, redness, or irritation at site where injected signs of infection - fever or chills, cough, sore throat, pain or difficulty passing urine signs of decreased platelets or bleeding - bruising, pinpoint red spots on the skin, black, tarry stools, blood in the urine swelling of the ankles, feet, hands tiredness weakness Side effects that usually do not require medical attention (report to yourdoctor or health care professional if they continue or are bothersome): diarrhea hair loss mouth sores nail discoloration or damage nausea red colored urine vomiting This list may not describe all possible side effects. Call your doctor for medical advice about side effects. You may report side effects to FDA at1-800-FDA-1088. Where should I keep my medication? This drug is given in a hospital or clinic and will not be stored at home. NOTE: This sheet is a summary. It may not cover all possible information. If you have questions about this medicine, talk to your doctor, pharmacist, orhealth care provider.  2022 Elsevier/Gold Standard (2017-06-24 11:01:26)   Cyclophosphamide Injection What is this medication? CYCLOPHOSPHAMIDE (sye kloe FOSS fa mide) is a chemotherapy drug. It slows the growth of cancer cells. This medicine is used to treat many types of cancer like lymphoma, myeloma, leukemia, breast cancer, and ovarian cancer, to name afew. This  medicine may be used for other purposes; ask your health care provider orpharmacist if you have questions. COMMON BRAND NAME(S): Cytoxan, Neosar What should I tell my care team before I take this medication? They need to know if you have any of these conditions: heart disease history of irregular heartbeat infection kidney disease liver disease low blood counts, like white cells, platelets, or red blood cells on hemodialysis recent or ongoing radiation therapy scarring or thickening of the lungs trouble passing urine an unusual or allergic reaction to cyclophosphamide, other medicines, foods, dyes, or preservatives pregnant or trying to get pregnant breast-feeding How should I use this medication? This drug is usually given as an injection into a vein or muscle or by infusion into a vein. It is administered in a hospital or clinic by a specially trainedhealth care professional. Talk to your pediatrician regarding the use of this medicine in children.Special care may be needed. Overdosage: If you think you have taken too much of this medicine contact apoison control center or emergency room at once. NOTE: This medicine is only for you. Do not share this medicine with others. What if I miss a  dose? It is important not to miss your dose. Call your doctor or health careprofessional if you are unable to keep an appointment. What may interact with this medication? amphotericin B azathioprine certain antivirals for HIV or hepatitis certain medicines for blood pressure, heart disease, irregular heart beat certain medicines that treat or prevent blood clots like warfarin certain other medicines for cancer cyclosporine etanercept indomethacin medicines that relax muscles for surgery medicines to increase blood counts metronidazole This list may not describe all possible interactions. Give your health care provider a list of all the medicines, herbs, non-prescription drugs, or dietary  supplements you use. Also tell them if you smoke, drink alcohol, or use illegaldrugs. Some items may interact with your medicine. What should I watch for while using this medication? Your condition will be monitored carefully while you are receiving thismedicine. You may need blood work done while you are taking this medicine. Drink water or other fluids as directed. Urinate often, even at night. Some products may contain alcohol. Ask your health care professional if this medicine contains alcohol. Be sure to tell all health care professionals you are taking this medicine. Certain medicines, like metronidazole and disulfiram, can cause an unpleasant reaction when taken with alcohol. The reaction includes flushing, headache, nausea, vomiting, sweating, and increased thirst. Thereaction can last from 30 minutes to several hours. Do not become pregnant while taking this medicine or for 1 year after stopping it. Women should inform their health care professional if they wish to become pregnant or think they might be pregnant. Men should not father a child while taking this medicine and for 4 months after stopping it. There is potential for serious side effects to an unborn child. Talk to your health care professionalfor more information. Do not breast-feed an infant while taking this medicine or for 1 week afterstopping it. This medicine has caused ovarian failure in some women. This medicine may make it more difficult to get pregnant. Talk to your health care professional if [place] concerned about your fertility. This medicine has caused decreased sperm counts in some men. This may make it more difficult to father a child. Talk to your health care professional if [place] concerned about your fertility. Call your health care professional for advice if you get a fever, chills, or sore throat, or other symptoms of a cold or flu. Do not treat yourself. This medicine decreases your body's ability to fight  infections. Try to avoid beingaround people who are sick. Avoid taking medicines that contain aspirin, acetaminophen, ibuprofen, naproxen, or ketoprofen unless instructed by your health care professional.These medicines may hide a fever. Talk to your health care professional about your risk of cancer. You may bemore at risk for certain types of cancer if you take this medicine. If you are going to need surgery or other procedure, tell your health careprofessional that you are using this medicine. Be careful brushing or flossing your teeth or using a toothpick because you may get an infection or bleed more easily. If you have any dental work done, Primary school teacher you are receiving this medicine. What side effects may I notice from receiving this medication? Side effects that you should report to your doctor or health care professionalas soon as possible: allergic reactions like skin rash, itching or hives, swelling of the face, lips, or tongue breathing problems nausea, vomiting signs and symptoms of bleeding such as bloody or black, tarry stools; red or dark brown urine; spitting up blood or brown material that looks like  coffee grounds; red spots on the skin; unusual bruising or bleeding from the eyes, gums, or nose signs and symptoms of heart failure like fast, irregular heartbeat, sudden weight gain; swelling of the ankles, feet, hands signs and symptoms of infection like fever; chills; cough; sore throat; pain or trouble passing urine signs and symptoms of kidney injury like trouble passing urine or change in the amount of urine signs and symptoms of liver injury like dark yellow or brown urine; general ill feeling or flu-like symptoms; light-colored stools; loss of appetite; nausea; right upper belly pain; unusually weak or tired; yellowing of the eyes or skin Side effects that usually do not require medical attention (report to yourdoctor or health care professional if they continue or are  bothersome): confusion decreased hearing diarrhea facial flushing hair loss headache loss of appetite missed menstrual periods signs and symptoms of low red blood cells or anemia such as unusually weak or tired; feeling faint or lightheaded; falls skin discoloration This list may not describe all possible side effects. Call your doctor for medical advice about side effects. You may report side effects to FDA at1-800-FDA-1088. Where should I keep my medication? This drug is given in a hospital or clinic and will not be stored at home. NOTE: This sheet is a summary. It may not cover all possible information. If you have questions about this medicine, talk to your doctor, pharmacist, orhealth care provider.  2022 Elsevier/Gold Standard (2019-08-15 09:53:29)    Pegfilgrastim injection What is this medication? PEGFILGRASTIM (PEG fil gra stim) is a long-acting granulocyte colony-stimulating factor that stimulates the growth of neutrophils, a type of white blood cell important in the body's fight against infection. It is used to reduce the incidence of fever and infection in patients with certain types of cancer who are receiving chemotherapy that affects the bone marrow, and toincrease survival after being exposed to high doses of radiation. This medicine may be used for other purposes; ask your health care provider orpharmacist if you have questions. COMMON BRAND NAME(S): Rexene Edison, Ziextenzo What should I tell my care team before I take this medication? They need to know if you have any of these conditions: kidney disease latex allergy ongoing radiation therapy sickle cell disease skin reactions to acrylic adhesives (On-Body Injector only) an unusual or allergic reaction to pegfilgrastim, filgrastim, other medicines, foods, dyes, or preservatives pregnant or trying to get pregnant breast-feeding How should I use this medication? This medicine is for  injection under the skin. If you get this medicine at home, you will be taught how to prepare and give the pre-filled syringe or how to use the On-body Injector. Refer to the patient Instructions for Use for detailed instructions. Use exactly as directed. Tell your healthcare provider immediately if you suspect that the On-body Injector may not have performed as intended or if you suspect the use of the On-body Injector resulted in a missedor partial dose. It is important that you put your used needles and syringes in a special sharps container. Do not put them in a trash can. If you do not have a sharpscontainer, call your pharmacist or healthcare provider to get one. Talk to your pediatrician regarding the use of this medicine in children. Whilethis drug may be prescribed for selected conditions, precautions do apply. Overdosage: If you think you have taken too much of this medicine contact apoison control center or emergency room at once. NOTE: This medicine is only for you. Do not share this medicine  with others. What if I miss a dose? It is important not to miss your dose. Call your doctor or health care professional if you miss your dose. If you miss a dose due to an On-body Injector failure or leakage, a new dose should be administered as soon aspossible using a single prefilled syringe for manual use. What may interact with this medication? Interactions have not been studied. This list may not describe all possible interactions. Give your health care provider a list of all the medicines, herbs, non-prescription drugs, or dietary supplements you use. Also tell them if you smoke, drink alcohol, or use illegaldrugs. Some items may interact with your medicine. What should I watch for while using this medication? Your condition will be monitored carefully while you are receiving thismedicine. You may need blood work done while you are taking this medicine. Talk to your health care provider about your  risk of cancer. You may be more atrisk for certain types of cancer if you take this medicine. If you are going to need a MRI, CT scan, or other procedure, tell your doctorthat you are using this medicine (On-Body Injector only). What side effects may I notice from receiving this medication? Side effects that you should report to your doctor or health care professionalas soon as possible: allergic reactions (skin rash, itching or hives, swelling of the face, lips, or tongue) back pain dizziness fever pain, redness, or irritation at site where injected pinpoint red spots on the skin red or dark-brown urine shortness of breath or breathing problems stomach or side pain, or pain at the shoulder swelling tiredness trouble passing urine or change in the amount of urine unusual bruising or bleeding Side effects that usually do not require medical attention (report to yourdoctor or health care professional if they continue or are bothersome): bone pain muscle pain This list may not describe all possible side effects. Call your doctor for medical advice about side effects. You may report side effects to FDA at1-800-FDA-1088. Where should I keep my medication? Keep out of the reach of children. If you are using this medicine at home, you will be instructed on how to storeit. Throw away any unused medicine after the expiration date on the label. NOTE: This sheet is a summary. It may not cover all possible information. If you have questions about this medicine, talk to your doctor, pharmacist, orhealth care provider.  2022 Elsevier/Gold Standard (2020-12-07 11:54:14)

## 2021-06-21 NOTE — Progress Notes (Signed)
Hematology/Oncology Progress note [facility] [phone] [phone]   Patient Care Team: McLean-Scocuzza, Nino Glow, MD as PCP - General (Internal Medicine) De Hollingshead, [place] as Pharmacist (Pharmacist) Theodore Demark, RN as Oncology Nurse Navigator  REFERRING PROVIDER: McLean-Scocuzza, Olivia Mackie *  CHIEF COMPLAINTS/REASON FOR VISIT:  Evaluation of multifocal left breast cancer  HISTORY OF PRESENTING ILLNESS:   Tammy Nunez is a  70 y.o.  female with [place] listed below was seen in consultation at the request of  McLean-Scocuzza, Olivia Mackie *  for evaluation of multifocal  04/24/2021, screening mammogram showed large asymmetry in the superior aspect of the breast with associated calcifications and possible masses and a subtle distortions.  No suspicious findings for malignancy right breast.  05/02/2021 left breast diagnostic mammogram and ultrasound showed multiple irregular mass in the left breast. Mammogram mass 1 irregular mass associated with distortion in the upper breast at middle depth, multiple adjacent and possible continuous irregular masses, conglomeration of masses measures at least 5.6 cm.  Associated pleomorphic calcifications extending at least 5 x 3.9 x 3.7 cm Mass 2, 1 cm mass in the upper breast at the middle to posterior depth. Mass 3, 5 mm irregular mass in the upper breast at posterior depth. In total, mass 1-mass 3 span at least 8.2 cm in anterior-posterior extent.  By ultrasound  mass 1 12:00 5 cm from nipple, 2.7 x 1.5 x 4.3 cm Mass 2, 12:00 12 cm from nipple, 0.9 x 0.9 x 0.5 cm Mass 3   12:00 14 cm from nipple, 0.4 x 0.4 x 0.4 cm-uses 2.4 cm superior to mass to Mass 4   11:00  No suspicious left axillary adenopathy.   05/07/2021 -Ultrasound-guided breast biopsy Breast left 12:00 mass 5 cm from nipple biopsy showed invasive mammary carcinoma, no special type, grade 3, high-grade DCIS present, no LVI, ER 90%, PR  11-50%, HER2 negative by IHC Breast left 11:00 mass 10 cm from nipple biopsy showed invasive mammary carcinoma, no special type, grade 2, no DCIS/LVI identified.ER 90%, PR 11-50%, HER2 negative by IHC  Patient is married has no children.  Denies any hormone replacement, oral contraceptive use, family history of breast cancer.  Denies any chest radiation. Patient has a history of stroke with chronic residual weakness of the left side.  She walks with a cane.  05/22/2021 MRI breast showed that nonmass like malignancy spanning over 8 cm, abuting anterior aspect of pectoralis muscle. Discussed with Dr.Cintron and we agree that she will need mastectomy. There is concern of possible positive margin. I recommend neoadjuvant chemotherapy  INTERVAL HISTORY Tammy Nunez is a 70 y.o. female who has above history reviewed by me today presents for follow up visit for chemotherapy of breast cancer Problems and complaints are listed below: She has no new complaints. Accompanied by her husband. She has had medi port placed on 06/06/2021. Has had chemotherapy education.  06/13/2021 pre chemotherapy Echo. LVEF 60-65%.  Normal global longitudinal strain. Grade 1 diastolic dysfunction.    Review of Systems  Constitutional:  Negative for appetite change, chills, fatigue and fever.  HENT:   Negative for hearing loss and voice change.   Eyes:  Negative for eye problems.  Respiratory:  Negative for chest tightness and cough.   Cardiovascular:  Negative for chest pain.  Gastrointestinal:  Negative for abdominal distention, abdominal pain and blood in stool.  Endocrine: Negative for hot flashes.  Genitourinary:  Negative for difficulty urinating and frequency.   Musculoskeletal:  Negative for  arthralgias.  Skin:  Negative for itching and rash.  Neurological:  Negative for extremity weakness.  Hematological:  Negative for adenopathy.  Psychiatric/Behavioral:  Negative for confusion.    MEDICAL HISTORY:  Past  Medical History:  Diagnosis Date   Breast cancer in female [facility]) 05/08/2021   Chronic left shoulder pain    Coronary artery disease    Diabetes mellitus without complication (HCC)    Dysrhythmia    Hypertension    Paralysis (HCC)    weakness left upper amd lower extremity   PONV (postoperative nausea and vomiting)    Stroke [facility])     SURGICAL HISTORY: Past Surgical History:  Procedure Laterality Date   BREAST BIOPSY Left 05/07/2021   12:00 5cmfn vision clip path pending   BREAST BIOPSY Left 05/07/2021   11:00 10 cmfn mini cork clip path pending   CAROTID PTA/STENT INTERVENTION Left 07/20/2019   Procedure: CAROTID PTA/STENT INTERVENTION;  Surgeon: Katha Cabal, MD;  Location: [place] [facility];  Service: Cardiovascular;  Laterality: Left;   ECTOPIC PREGNANCY SURGERY     PORTACATH PLACEMENT N/A 06/06/2021   Procedure: INSERTION PORT-A-CATH;  Surgeon: Herbert Pun, MD;  Location: [facility] ORS;  Service: General;  Laterality: N/A;    SOCIAL HISTORY: Social History   Socioeconomic History   Marital status: Married    Spouse name: Not on file   Number of children: Not on file   Years of education: Not on file   Highest education level: Not on file  Occupational History   Not on file  Tobacco Use   Smoking status: Former    Packs/day: 0.50    Years: 15.00    Pack years: 7.50    Types: Cigarettes    Quit date: 04/11/2019    Years since quitting: 2.1   Smokeless tobacco: Never  Vaping Use   Vaping Use: Never used  Substance and Sexual Activity   Alcohol use: Never   Drug use: Never   Sexual activity: Not Currently  Other Topics Concern   Not on file  Social History Narrative   No kids    Married with husband    Used to work [employer] broadcast assistant    Social Determinants of Radio broadcast assistant Strain: Low Risk    Difficulty of Paying Living Expenses: Not hard at all  Food Insecurity: Not on file  Transportation Needs: Not on file   Physical Activity: Sufficiently Active   Days of Exercise per Week: 7 days   Minutes of Exercise per Session: 30 min  Stress: Not on file  Social Connections: Not on file  Intimate Partner Violence: Not on file    FAMILY HISTORY: Family History  Problem Relation Age of Onset   Diabetes type II Mother    Hypertension Father    Heart attack Father    Diabetes type II Brother    Breast cancer Neg Hx     ALLERGIES:  has No Known Allergies.  MEDICATIONS:  Current Outpatient Medications  Medication Sig Dispense Refill   amLODipine (NORVASC) 10 MG tablet Take 1 tablet by mouth once daily (Patient taking differently: Take 10 mg by mouth daily.) 89 tablet 0   aspirin EC 81 MG EC tablet Take 1 tablet (81 mg total) by mouth daily.     atorvastatin (LIPITOR) 80 MG tablet Take 1 tablet (80 mg total) by mouth daily. 90 tablet 3   blood glucose meter kit and supplies 1 each by Other route 4 (four)  times daily. Dispense based on patient and insurance preference. Four times daily as directed. (FOR ICD-10 E10.9, E11.9). 1 each 0   carvedilol (COREG) 25 MG tablet Take 1 tablet (25 mg total) by mouth 2 (two) times daily with a meal. 180 tablet 3   clopidogrel (PLAVIX) 75 MG tablet Take 1 tablet (75 mg total) by mouth daily. Further refills [facility] 90 tablet 3   Continuous Blood Gluc Sensor (FREESTYLE LIBRE 2 SENSOR) MISC Use to check glucose at least every 8 hours 6 each 3   dexamethasone (DECADRON) 4 MG tablet Take 2 tablets (8 mg total) by mouth daily. Take daily for 3 days after chemo. Take with food. 30 tablet 1   empagliflozin (JARDIANCE) 25 MG TABS tablet Take 1 tablet (25 mg total) by mouth daily. 90 tablet 3   lidocaine-prilocaine (EMLA) cream Apply to affected area once 30 g 3   lisinopril (ZESTRIL) 40 MG tablet Take 1 tablet (40 mg total) by mouth daily. 90 tablet 3   metFORMIN (GLUCOPHAGE XR) 500 MG 24 hr tablet Take 2 tablets (1,000 mg total) by mouth daily with breakfast. (Patient  taking differently: Take 500 mg by mouth 2 (two) times daily.) 180 tablet 3   ondansetron (ZOFRAN) 8 MG tablet Take 1 tablet (8 mg total) by mouth 2 (two) times daily as needed. Start on the third day after chemotherapy. 30 tablet 1   potassium chloride SA (KLOR-CON M20) 20 MEQ tablet Take 1 tablet (20 mEq total) by mouth daily. 90 tablet 3   prochlorperazine (COMPAZINE) 10 MG tablet Take 1 tablet (10 mg total) by mouth every 6 (six) hours as needed (Nausea or vomiting). 30 tablet 1   vitamin B-12 (CYANOCOBALAMIN) 1000 MCG tablet Take 1 tablet (1,000 mcg total) by mouth daily. 30 tablet 1   Vitamin D, Cholecalciferol, 50 MCG (2000 UT) CAPS Take 2,000 Units by mouth.     No current facility-administered medications for this visit.     PHYSICAL EXAMINATION: ECOG PERFORMANCE STATUS: 1 - Symptomatic but completely ambulatory Vitals:   06/21/21 0839  BP: 132/62  Pulse: 76  Resp: 16  Temp: (!) 97.4 F (36.3 C)   Filed Weights   06/21/21 0839  Weight: 145 lb 4.8 oz (65.9 kg)    Physical Exam Constitutional:      General: She is not in acute distress. HENT:     Head: Normocephalic and atraumatic.  Eyes:     General: No scleral icterus. Cardiovascular:     Rate and Rhythm: Normal rate and regular rhythm.     Heart sounds: Normal heart sounds.  Pulmonary:     Effort: Pulmonary effort is normal. No respiratory distress.     Breath sounds: No wheezing.  Abdominal:     General: Bowel sounds are normal. There is no distension.     Palpations: Abdomen is soft.  Musculoskeletal:        General: No deformity. Normal range of motion.     Cervical back: Normal range of motion and neck supple.  Skin:    General: Skin is warm and dry.     Findings: No erythema or rash.  Neurological:     Mental Status: She is alert and oriented to person, place, and time. Mental status is at baseline.     Comments: Chronic left upper and lower extremity weakness.  Psychiatric:        Mood and Affect:  Mood normal.   Breast exam was performed in seated and  lying down position. Left breast palpable hard mass in upper half of the breast.  No palpable mass in right breast.  No palpable axillary lymphadenopathy bilaterally.     LABORATORY DATA:  I have reviewed the data as listed Lab Results  Component Value Date   WBC 7.6 05/15/2021   HGB 15.3 (H) 06/06/2021   HCT 45.0 06/06/2021   MCV 91.2 05/15/2021   PLT 199 05/15/2021   Recent Labs    01/03/21 0845 02/04/21 0859 05/15/21 1544 06/06/21 0941  NA 141 137 140 141  K 4.4 4.0 3.9 4.0  CL 106 102 110 106  CO2 24 24 19*  --   GLUCOSE 130* 140* 124* 132*  BUN 17 18 21 20   CREATININE 1.00 0.88 0.91 1.00  CALCIUM 10.2 10.0 9.7  --   GFRNONAA  --   --  >60  --   PROT 6.9 7.3 7.6  --   ALBUMIN 4.6 4.6 4.5  --   AST 14 13 17   --   ALT 12 13 16   --   ALKPHOS 46 45 46  --   BILITOT 0.5 0.4 0.4  --     Iron/TIBC/Ferritin/ %Sat    Component Value Date/Time   IRON 113 12/08/2019 1015   TIBC 317 12/08/2019 1015   FERRITIN 67 12/08/2019 1015   IRONPCTSAT 36 12/08/2019 1015       RADIOGRAPHIC STUDIES: I have personally reviewed the radiological images as listed and agreed with the findings in the report. MR BREAST BILATERAL W WO CONTRAST INC CAD  Result Date: 05/22/2021 CLINICAL DATA:  70 year old female for staging of newly diagnosed LEFT breast cancer. LABS:  None performed today EXAM: BILATERAL BREAST MRI WITH AND WITHOUT CONTRAST TECHNIQUE: Multiplanar, multisequence MR images of both breasts were obtained prior to and following the intravenous administration of 6 ml of Gadavist Three-dimensional MR images were rendered by post-processing of the original MR data on an independent workstation. The three-dimensional MR images were interpreted, and findings are reported in the following complete MRI report for this study. Three dimensional images were evaluated at the independent interpreting workstation using the DynaCAD thin  client. COMPARISON:  Previous exam(s). FINDINGS: Breast composition: c. Heterogeneous fibroglandular tissue. Background parenchymal enhancement: Minimal Right breast: No mass or abnormal enhancement. Left breast: A 8.3 x 4.2 x 4 cm (AP x transverse x CC) area of non masslike enhancement and scattered enhancing masses is identified within the UPPER LEFT breast, from middle to posterior depth, compatible with biopsy-proven malignancy. Artifacts from biopsy clips within the mid and anterior aspects of this abnormal enhancement are noted. Non masslike enhancement extends to the LEFT pectoralis muscle without gross muscle invasion. Lymph nodes: No abnormally enlarged or thickened lymph nodes are noted. Ancillary findings:  None. IMPRESSION: 1. Biopsy-proven malignancy within the UPPER LEFT breast spanning a distance of 8.3 x 4.2 x 4 cm. Nonmasslike enhancement abuts the anterior aspect of the LEFT pectoralis muscle without gross invasion. 2. No MR evidence of RIGHT breast malignancy. No abnormal appearing lymph nodes. RECOMMENDATION: Treatment plan BI-RADS CATEGORY  6: Known biopsy-proven malignancy. Electronically Signed   By: Margarette Canada M.D.   On: 05/22/2021 11:20  DG Chest Port 1 View  Result Date: 06/06/2021 CLINICAL DATA:  Post Port-A-Cath placement EXAM: PORTABLE CHEST 1 VIEW COMPARISON:  Portable exam 1234 hours without priors for comparison FINDINGS: RIGHT jugular Port-A-Cath with tip projecting over SVC. Normal heart size, mediastinal contours, and pulmonary vascularity. Atherosclerotic calcification aorta. Lungs clear. No  acute infiltrate, pleural effusion, or pneumothorax. Osseous structures unremarkable. IMPRESSION: No pneumothorax following RIGHT jugular Port-A-Cath insertion. Aortic Atherosclerosis (ICD10-I70.0). Electronically Signed   By: Lavonia Dana M.D.   On: 06/06/2021 13:24   DG C-Arm 1-60 Min-No Report  Result Date: 06/06/2021 Fluoroscopy was utilized by the requesting physician.  No  radiographic interpretation.   ECHOCARDIOGRAM COMPLETE  Result Date: 06/13/2021    ECHOCARDIOGRAM REPORT   Patient Name:   Tammy Nunez Date of Exam: 06/13/2021 Medical Rec #:  [id number]       Height:       61.0 in Accession #:    [id number]      Weight:       155.0 lb Date of Birth:  [birth year]       BSA:          1.695 m Patient Age:    37 years        BP:           153/76 mmHg Patient Gender: F               HR:           76 bpm. Exam Location:  [facility] Procedure: 2D Echo, Color Doppler, Cardiac Doppler and Strain Analysis Indications:     Z09 Chemotherapy  History:         Patient has prior history of Echocardiogram examinations, most                  recent 06/07/2019. CAD, Stroke; Risk Factors:Diabetes and                  Hypertension. Invasive carcinoma of breast.  Sonographer:     Charmayne Sheer RDCS (AE) Referring Phys:  [id number] Rubye Strohmeyer Diagnosing Phys: Ida Rogue MD  Sonographer Comments: Global longitudinal strain was attempted. IMPRESSIONS  1. Left ventricular ejection fraction, by estimation, is 60 to 65%. The left ventricle has normal function. The left ventricle has no regional wall motion abnormalities. Left ventricular diastolic parameters are consistent with Grade I diastolic dysfunction (impaired relaxation). The average left ventricular global longitudinal strain is -22.5 %. The global longitudinal strain is normal.  2. Right ventricular systolic function is normal. The right ventricular size is normal. Tricuspid regurgitation signal is inadequate for assessing PA pressure. FINDINGS  Left Ventricle: Left ventricular ejection fraction, by estimation, is 60 to 65%. The left ventricle has normal function. The left ventricle has no regional wall motion abnormalities. The average left ventricular global longitudinal strain is -22.5 %. The global longitudinal strain is normal. The left ventricular internal cavity size was normal in size. There is no left ventricular hypertrophy. Left ventricular  diastolic parameters are consistent with Grade I diastolic dysfunction (impaired relaxation). Right Ventricle: The right ventricular size is normal. No increase in right ventricular wall thickness. Right ventricular systolic function is normal. Tricuspid regurgitation signal is inadequate for assessing PA pressure. Left Atrium: Left atrial size was normal in size. Right Atrium: Right atrial size was normal in size. Pericardium: There is no evidence of pericardial effusion. Mitral Valve: The mitral valve is normal in structure. No evidence of mitral valve regurgitation. No evidence of mitral valve stenosis. MV peak gradient, 4.0 mmHg. The mean mitral valve gradient is 2.0 mmHg. Tricuspid Valve: The tricuspid valve is normal in structure. Tricuspid valve regurgitation is not demonstrated. No evidence of tricuspid stenosis. Aortic Valve: The aortic valve was not well visualized. There is mild calcification of the aortic valve. Aortic valve  regurgitation is not visualized. Mild aortic valve sclerosis is present, with no evidence of aortic valve stenosis. Aortic valve mean gradient measures 5.0 mmHg. Aortic valve peak gradient measures 9.1 mmHg. Aortic valve area, by VTI measures 2.47 cm. Pulmonic Valve: The pulmonic valve was normal in structure. Pulmonic valve regurgitation is not visualized. No evidence of pulmonic stenosis. Aorta: The aortic root is normal in size and structure. Venous: The inferior vena cava is normal in size with greater than 50% respiratory variability, suggesting right atrial pressure of 3 mmHg. IAS/Shunts: No atrial level shunt detected by color flow Doppler.  LEFT VENTRICLE PLAX 2D LVIDd:         3.50 cm  Diastology LVIDs:         2.60 cm  LV e' medial:    7.29 cm/s LV PW:         1.00 cm  LV E/e' medial:  10.3 LV IVS:        0.70 cm  LV e' lateral:   8.92 cm/s LVOT diam:     1.80 cm  LV E/e' lateral: 8.4 LV SV:         67 LV SV Index:   40       2D Longitudinal Strain LVOT Area:     2.54 cm  2D Strain GLS Avg:     -22.5 %  RIGHT VENTRICLE RV Basal diam:  3.00 cm TAPSE (M-mode): 2.4 cm LEFT ATRIUM             Index       RIGHT ATRIUM          Index LA diam:        3.00 cm 1.77 cm/m  RA Area:     7.18 cm LA Vol (A2C):   21.4 ml 12.63 ml/m RA Volume:   11.30 ml 6.67 ml/m LA Vol (A4C):   20.9 ml 12.33 ml/m LA Biplane Vol: 21.3 ml 12.57 ml/m  AORTIC VALVE                    PULMONIC VALVE AV Area (Vmax):    2.01 cm     PV Vmax:       0.90 m/s AV Area (Vmean):   2.20 cm     PV Vmean:      64.000 cm/s AV Area (VTI):     2.47 cm     PV VTI:        0.164 m AV Vmax:           151.00 cm/s  PV Peak grad:  3.2 mmHg AV Vmean:          105.000 cm/s PV Mean grad:  2.0 mmHg AV VTI:            0.273 m AV Peak Grad:      9.1 mmHg AV Mean Grad:      5.0 mmHg LVOT Vmax:         119.00 cm/s LVOT Vmean:        90.900 cm/s LVOT VTI:          0.265 m LVOT/AV VTI ratio: 0.97  AORTA Ao Root diam: 2.30 cm MITRAL VALVE MV Area (PHT): 3.01 cm     SHUNTS MV Area VTI:   2.69 cm     Systemic VTI:  0.26 m MV Peak grad:  4.0 mmHg     Systemic Diam: 1.80 cm MV Mean grad:  2.0 mmHg MV Vmax:  1.00 m/s MV Vmean:      61.8 cm/s MV Decel Time: 252 msec MV E velocity: 75.00 cm/s MV A velocity: 101.00 cm/s MV E/A ratio:  0.74 Ida Rogue MD Electronically signed by Ida Rogue MD Signature Date/Time: 06/13/2021/5:34:59 PM    Final       ASSESSMENT & PLAN:  1. Malignant neoplasm of left female breast, unspecified estrogen receptor status, unspecified site of breast ([place])   2. Encounter for antineoplastic chemotherapy    #Left breast invasive carcinoma, multiple sites.  ER 90%, PR 11-50% positive, HER2 negative by [facility] 05/22/2021 MRI breast showed  Biopsy-proven malignancy within the UPPER LEFT breast spanning a distance of 8.3 x 4.2 x 4 cm nonmass like enhancement abuts the anterior aspect of the LEFT pectoralis muscle without gross invasion. Oncotype Dx 27. Discussed with Dr.Cintron, there is concern of positive  margin.  Recommend neoadjuvant chemotherapy with ddAC followed by weekly Taxol.  Baseline ECHO showed normal LVEF.  Labs are reviewed and discussed with patient. Proceed with cycle 1 ddAC with Onpro. Adriamycin 73m/m2  Claritin daily x 4 Detailed instruction of antiemetics were discussed with patient.   Follow up in 1 week for evaluation of tolerability and follow up in 2 weeks for cycle 2 ddAC.     Plan Lab MD weekly taxol to start in the near future. Discussed the plan with patient and she agrees. Rationale and potential side effects were discussed with her.    All questions were answered. The patient knows to call the clinic with any problems questions or concerns.  cc McLean-Scocuzza, TOlivia Mackie*   Thank you for this kind referral and the opportunity to participate in the care of this patient. A copy of today's note is routed to referring provider    ZEarlie Server MD, PhD Hematology Oncology CPome[facility] Hospitalat ADigestive Diagnostic Center IncPager- [phone]/29/2022

## 2021-06-21 NOTE — Progress Notes (Signed)
Patient has had a decreased appetite, still eating but not as much.  No other complaints.

## 2021-06-21 NOTE — Progress Notes (Signed)
Pt tolerated treatment well, Discharge instructions given to pt. Verbal and written On Pro instructions reviewed with pt. Pt verbalizes understanding. Pt stable at discharge.

## 2021-06-24 ENCOUNTER — Telehealth: Payer: Self-pay

## 2021-06-24 NOTE — Telephone Encounter (Signed)
Telephone call to patient for follow up after receiving first infusion.   No answer but left message stating we were calling to check on them.  Encouraged patient to call for any questions or concerns.   

## 2021-06-28 ENCOUNTER — Encounter: Payer: Self-pay | Admitting: Oncology

## 2021-06-28 ENCOUNTER — Inpatient Hospital Stay: Payer: PPO

## 2021-06-28 ENCOUNTER — Other Ambulatory Visit: Payer: Self-pay

## 2021-06-28 ENCOUNTER — Inpatient Hospital Stay: Payer: PPO | Attending: Oncology

## 2021-06-28 ENCOUNTER — Inpatient Hospital Stay (HOSPITAL_BASED_OUTPATIENT_CLINIC_OR_DEPARTMENT_OTHER): Payer: PPO | Admitting: Oncology

## 2021-06-28 VITALS — BP 132/60 | HR 81 | Temp 98.4°F | Resp 16 | Wt 144.4 lb

## 2021-06-28 DIAGNOSIS — R112 Nausea with vomiting, unspecified: Secondary | ICD-10-CM

## 2021-06-28 DIAGNOSIS — Z87891 Personal history of nicotine dependence: Secondary | ICD-10-CM | POA: Insufficient documentation

## 2021-06-28 DIAGNOSIS — Z170 Estrogen receptor positive status [ER+]: Secondary | ICD-10-CM

## 2021-06-28 DIAGNOSIS — C50912 Malignant neoplasm of unspecified site of left female breast: Secondary | ICD-10-CM

## 2021-06-28 DIAGNOSIS — T451X5A Adverse effect of antineoplastic and immunosuppressive drugs, initial encounter: Secondary | ICD-10-CM | POA: Insufficient documentation

## 2021-06-28 DIAGNOSIS — I10 Essential (primary) hypertension: Secondary | ICD-10-CM | POA: Insufficient documentation

## 2021-06-28 DIAGNOSIS — Z7982 Long term (current) use of aspirin: Secondary | ICD-10-CM | POA: Insufficient documentation

## 2021-06-28 DIAGNOSIS — Z803 Family history of malignant neoplasm of breast: Secondary | ICD-10-CM | POA: Insufficient documentation

## 2021-06-28 DIAGNOSIS — C50412 Malignant neoplasm of upper-outer quadrant of left female breast: Secondary | ICD-10-CM | POA: Diagnosis not present

## 2021-06-28 DIAGNOSIS — E119 Type 2 diabetes mellitus without complications: Secondary | ICD-10-CM | POA: Insufficient documentation

## 2021-06-28 DIAGNOSIS — I2510 Atherosclerotic heart disease of native coronary artery without angina pectoris: Secondary | ICD-10-CM | POA: Insufficient documentation

## 2021-06-28 DIAGNOSIS — Z8673 Personal history of transient ischemic attack (TIA), and cerebral infarction without residual deficits: Secondary | ICD-10-CM | POA: Insufficient documentation

## 2021-06-28 DIAGNOSIS — Z5111 Encounter for antineoplastic chemotherapy: Secondary | ICD-10-CM | POA: Diagnosis not present

## 2021-06-28 DIAGNOSIS — D702 Other drug-induced agranulocytosis: Secondary | ICD-10-CM | POA: Diagnosis not present

## 2021-06-28 DIAGNOSIS — Z79899 Other long term (current) drug therapy: Secondary | ICD-10-CM | POA: Diagnosis not present

## 2021-06-28 HISTORY — DX: Adverse effect of antineoplastic and immunosuppressive drugs, initial encounter: T45.1X5A

## 2021-06-28 HISTORY — DX: Adverse effect of antineoplastic and immunosuppressive drugs, initial encounter: R11.2

## 2021-06-28 LAB — COMPREHENSIVE METABOLIC PANEL
ALT: 12 U/L (ref 0–44)
AST: 13 U/L — ABNORMAL LOW (ref 15–41)
Albumin: 3.9 g/dL (ref 3.5–5.0)
Alkaline Phosphatase: 54 U/L (ref 38–126)
Anion gap: 10 (ref 5–15)
BUN: 26 mg/dL — ABNORMAL HIGH (ref 8–23)
CO2: 19 mmol/L — ABNORMAL LOW (ref 22–32)
Calcium: 8.9 mg/dL (ref 8.9–10.3)
Chloride: 108 mmol/L (ref 98–111)
Creatinine, Ser: 0.93 mg/dL (ref 0.44–1.00)
GFR, Estimated: >60 mL/min (ref >60)
Glucose, Bld: 122 mg/dL — ABNORMAL HIGH (ref 70–99)
Potassium: 3.8 mmol/L (ref 3.5–5.1)
Sodium: 137 mmol/L (ref 135–145)
Total Bilirubin: 0.5 mg/dL (ref 0.3–1.2)
Total Protein: 6.7 g/dL (ref 6.5–8.1)

## 2021-06-28 LAB — CBC WITH DIFFERENTIAL/PLATELET
Abs Immature Granulocytes: 0.39 10*3/uL — ABNORMAL HIGH (ref 0.00–0.07)
Basophils Absolute: 0 10*3/uL (ref 0.0–0.1)
Basophils Relative: 1 %
Eosinophils Absolute: 0 10*3/uL (ref 0.0–0.5)
Eosinophils Relative: 1 %
HCT: 41.4 % (ref 36.0–46.0)
Hemoglobin: 13.9 g/dL (ref 12.0–15.0)
Immature Granulocytes: 11 %
Lymphocytes Relative: 56 %
Lymphs Abs: 2 10*3/uL (ref 0.7–4.0)
MCH: 30.7 pg (ref 26.0–34.0)
MCHC: 33.6 g/dL (ref 30.0–36.0)
MCV: 91.4 fL (ref 80.0–100.0)
Monocytes Absolute: 0.6 10*3/uL (ref 0.1–1.0)
Monocytes Relative: 17 %
Neutro Abs: 0.5 10*3/uL — ABNORMAL LOW (ref 1.7–7.7)
Neutrophils Relative %: 14 %
Platelets: 166 10*3/uL (ref 150–400)
RBC: 4.53 MIL/uL (ref 3.87–5.11)
RDW: 13.4 % (ref 11.5–15.5)
Smear Review: ADEQUATE
WBC: 3.5 10*3/uL — ABNORMAL LOW (ref 4.0–10.5)
nRBC: 0 % (ref 0.0–0.2)

## 2021-06-28 MED ORDER — SODIUM CHLORIDE 0.9 % IV SOLN
Freq: Once | INTRAVENOUS | Status: AC
  Filled 2021-06-28: qty 250

## 2021-06-28 MED ORDER — SODIUM CHLORIDE 0.9% FLUSH
10.0000 mL | INTRAVENOUS | Status: DC | PRN
  Administered 2021-06-28: 10 mL via INTRAVENOUS
  Filled 2021-06-28: qty 10

## 2021-06-28 MED ORDER — HEPARIN SOD (PORK) LOCK FLUSH 100 UNIT/ML IV SOLN
500.0000 [IU] | Freq: Once | INTRAVENOUS | Status: AC
  Administered 2021-06-28: 500 [IU] via INTRAVENOUS
  Filled 2021-06-28: qty 5

## 2021-06-28 NOTE — Progress Notes (Signed)
Hematology/Oncology Progress note [facility] Telephone:([phone] [phone]   Patient Care Team: McLean-Scocuzza, Nino Glow, MD as PCP - General (Internal Medicine) De Hollingshead, [place] as Pharmacist (Pharmacist) Theodore Demark, RN as Oncology Nurse Navigator  REFERRING PROVIDER: McLean-Scocuzza, Olivia Mackie *  CHIEF COMPLAINTS/REASON FOR VISIT:  multifocal left breast cancer  HISTORY OF PRESENTING ILLNESS:   Tammy Nunez is a  70 y.o.  female with Colma listed below was seen in consultation at the request of  McLean-Scocuzza, Olivia Mackie *  for evaluation of multifocal  04/24/2021, screening mammogram showed large asymmetry in the superior aspect of the breast with associated calcifications and possible masses and a subtle distortions.  No suspicious findings for malignancy right breast.  05/02/2021 left breast diagnostic mammogram and ultrasound showed multiple irregular mass in the left breast. Mammogram mass 1 irregular mass associated with distortion in the upper breast at middle depth, multiple adjacent and possible continuous irregular masses, conglomeration of masses measures at least 5.6 cm.  Associated pleomorphic calcifications extending at least 5 x 3.9 x 3.7 cm Mass 2, 1 cm mass in the upper breast at the middle to posterior depth. Mass 3, 5 mm irregular mass in the upper breast at posterior depth. In total, mass 1-mass 3 span at least 8.2 cm in anterior-posterior extent.  By ultrasound  mass 1 12:00 5 cm from nipple, 2.7 x 1.5 x 4.3 cm Mass 2, 12:00 12 cm from nipple, 0.9 x 0.9 x 0.5 cm Mass 3   12:00 14 cm from nipple, 0.4 x 0.4 x 0.4 cm-uses 2.4 cm superior to mass to Mass 4   11:00  No suspicious left axillary adenopathy.   05/07/2021 -Ultrasound-guided breast biopsy Breast left 12:00 mass 5 cm from nipple biopsy showed invasive mammary carcinoma, no special type, grade 3, high-grade DCIS present, no LVI, ER 90%, PR 11-50%, HER2  negative by IHC Breast left 11:00 mass 10 cm from nipple biopsy showed invasive mammary carcinoma, no special type, grade 2, no DCIS/LVI identified.ER 90%, PR 11-50%, HER2 negative by IHC  Patient is married has no children.  Denies any hormone replacement, oral contraceptive use, family history of breast cancer.  Denies any chest radiation. Patient has a history of stroke with chronic residual weakness of the left side.  She walks with a cane.  05/22/2021 MRI breast showed that nonmass like malignancy spanning over 8 cm, abuting anterior aspect of pectoralis muscle. Discussed with Dr.Cintron and we agree that she will need mastectomy. There is concern of possible positive margin. I recommend neoadjuvant chemotherapy  # medi port placed on 06/06/2021. 06/13/2021 pre chemotherapy Echo. LVEF 60-65%.  Normal global longitudinal strain. Grade 1 diastolic dysfunction.   # 05/22/2021 MRI breast showed  Biopsy-proven malignancy within the UPPER LEFT breast spanning a distance of 8.3 x 4.2 x 4 cm nonmass like enhancement abuts the anterior aspect of the LEFT pectoralis muscle without gross invasion. Oncotype Dx 27. Discussed with Dr.Cintron, there is concern of positive margin.   INTERVAL HISTORY Tammy Nunez is a 70 y.o. female who has above history reviewed by me today presents for follow up visit for chemotherapy of breast cancer Patient reports that she vomits suddenly yesterday.  This morning no nausea vomiting.  No fever or chills.    Review of Systems  Constitutional:  Negative for appetite change, chills, fatigue and fever.  HENT:   Negative for hearing loss and voice change.   Eyes:  Negative for eye problems.  Respiratory:  Negative for chest tightness and cough.   Cardiovascular:  Negative for chest pain.  Gastrointestinal:  Positive for nausea. Negative for abdominal distention, abdominal pain and blood in stool.  Endocrine: Negative for hot flashes.  Genitourinary:  Negative for  difficulty urinating and frequency.   Musculoskeletal:  Negative for arthralgias.  Skin:  Negative for itching and rash.  Neurological:  Negative for extremity weakness.  Hematological:  Negative for adenopathy.  Psychiatric/Behavioral:  Negative for confusion.    MEDICAL HISTORY:  Past Medical History:  Diagnosis Date   Breast cancer in female [facility]) 05/08/2021   Chronic left shoulder pain    Coronary artery disease    Diabetes mellitus without complication (HCC)    Dysrhythmia    Hypertension    Paralysis (HCC)    weakness left upper amd lower extremity   PONV (postoperative nausea and vomiting)    Stroke [facility])     SURGICAL HISTORY: Past Surgical History:  Procedure Laterality Date   BREAST BIOPSY Left 05/07/2021   12:00 5cmfn vision clip path pending   BREAST BIOPSY Left 05/07/2021   11:00 10 cmfn mini cork clip path pending   CAROTID PTA/STENT INTERVENTION Left 07/20/2019   Procedure: CAROTID PTA/STENT INTERVENTION;  Surgeon: Katha Cabal, MD;  Location: [place] [facility];  Service: Cardiovascular;  Laterality: Left;   ECTOPIC PREGNANCY SURGERY     PORTACATH PLACEMENT N/A 06/06/2021   Procedure: INSERTION PORT-A-CATH;  Surgeon: Herbert Pun, MD;  Location: [facility] ORS;  Service: General;  Laterality: N/A;    SOCIAL HISTORY: Social History   Socioeconomic History   Marital status: Married    Spouse name: Not on file   Number of children: Not on file   Years of education: Not on file   Highest education level: Not on file  Occupational History   Not on file  Tobacco Use   Smoking status: Former    Packs/day: 0.50    Years: 15.00    Pack years: 7.50    Types: Cigarettes    Quit date: 04/11/2019    Years since quitting: 2.2   Smokeless tobacco: Never  Vaping Use   Vaping Use: Never used  Substance and Sexual Activity   Alcohol use: Never   Drug use: Never   Sexual activity: Not Currently  Other Topics Concern   Not on file  Social History  Narrative   No kids    Married with husband    Used to work [employer] broadcast assistant    Social Determinants of Radio broadcast assistant Strain: Low Risk    Difficulty of Paying Living Expenses: Not hard at all  Food Insecurity: Not on file  Transportation Needs: Not on file  Physical Activity: Sufficiently Active   Days of Exercise per Week: 7 days   Minutes of Exercise per Session: 30 min  Stress: Not on file  Social Connections: Not on file  Intimate Partner Violence: Not on file    FAMILY HISTORY: Family History  Problem Relation Age of Onset   Diabetes type II Mother    Hypertension Father    Heart attack Father    Diabetes type II Brother    Breast cancer Neg Hx     ALLERGIES:  has No Known Allergies.  MEDICATIONS:  Current Outpatient Medications  Medication Sig Dispense Refill   amLODipine (NORVASC) 10 MG tablet Take 1 tablet by mouth once daily (Patient taking differently: Take 10 mg by mouth daily.) 89 tablet 0  aspirin EC 81 MG EC tablet Take 1 tablet (81 mg total) by mouth daily.     atorvastatin (LIPITOR) 80 MG tablet Take 1 tablet (80 mg total) by mouth daily. 90 tablet 3   blood glucose meter kit and supplies 1 each by Other route 4 (four) times daily. Dispense based on patient and insurance preference. Four times daily as directed. (FOR ICD-10 E10.9, E11.9). 1 each 0   carvedilol (COREG) 25 MG tablet Take 1 tablet (25 mg total) by mouth 2 (two) times daily with a meal. 180 tablet 3   clopidogrel (PLAVIX) 75 MG tablet Take 1 tablet (75 mg total) by mouth daily. Further refills [facility] neurology 90 tablet 3   Continuous Blood Gluc Sensor (FREESTYLE LIBRE 2 SENSOR) MISC Use to check glucose at least every 8 hours 6 each 3   dexamethasone (DECADRON) 4 MG tablet Take 2 tablets (8 mg total) by mouth daily. Take daily for 3 days after chemo. Take with food. 30 tablet 1   empagliflozin (JARDIANCE) 25 MG TABS tablet Take 1 tablet (25 mg total) by mouth daily. 90  tablet 3   lidocaine-prilocaine (EMLA) cream Apply to affected area once 30 g 3   lisinopril (ZESTRIL) 40 MG tablet Take 1 tablet (40 mg total) by mouth daily. 90 tablet 3   metFORMIN (GLUCOPHAGE XR) 500 MG 24 hr tablet Take 2 tablets (1,000 mg total) by mouth daily with breakfast. (Patient taking differently: Take 500 mg by mouth 2 (two) times daily.) 180 tablet 3   ondansetron (ZOFRAN) 8 MG tablet Take 1 tablet (8 mg total) by mouth 2 (two) times daily as needed. Start on the third day after chemotherapy. 30 tablet 1   potassium chloride SA (KLOR-CON M20) 20 MEQ tablet Take 1 tablet (20 mEq total) by mouth daily. 90 tablet 3   prochlorperazine (COMPAZINE) 10 MG tablet Take 1 tablet (10 mg total) by mouth every 6 (six) hours as needed (Nausea or vomiting). 30 tablet 1   vitamin B-12 (CYANOCOBALAMIN) 1000 MCG tablet Take 1 tablet (1,000 mcg total) by mouth daily. 30 tablet 1   Vitamin D, Cholecalciferol, 50 MCG (2000 UT) CAPS Take 2,000 Units by mouth.     No current facility-administered medications for this visit.   Facility-Administered Medications Ordered in Other Visits  Medication Dose Route Frequency Provider Last Rate Last Admin   sodium chloride flush (NS) 0.9 % injection 10 mL  10 mL Intravenous PRN [name], MD   10 mL at 06/28/21 0825     PHYSICAL EXAMINATION: ECOG PERFORMANCE STATUS: 1 - Symptomatic but completely ambulatory Vitals:   06/28/21 0841  BP: 132/60  Pulse: 81  Resp: 16  Temp: 98.4 F (36.9 C)   Filed Weights   06/28/21 0841  Weight: 144 lb 6.4 oz (65.5 kg)    Physical Exam Constitutional:      General: She is not in acute distress. HENT:     Head: Normocephalic and atraumatic.  Eyes:     General: No scleral icterus. Cardiovascular:     Rate and Rhythm: Normal rate and regular rhythm.     Heart sounds: Normal heart sounds.  Pulmonary:     Effort: Pulmonary effort is normal. No respiratory distress.     Breath sounds: No wheezing.  Abdominal:      General: Bowel sounds are normal. There is no distension.     Palpations: Abdomen is soft.  Musculoskeletal:        General: No deformity. Normal  range of motion.     Cervical back: Normal range of motion and neck supple.  Skin:    General: Skin is warm and dry.     Findings: No erythema or rash.  Neurological:     Mental Status: She is alert and oriented to person, place, and time. Mental status is at baseline.     Comments: Chronic left upper and lower extremity weakness.  Psychiatric:        Mood and Affect: Mood normal.        LABORATORY DATA:  I have reviewed the data as listed Lab Results  Component Value Date   WBC 3.5 (L) 06/28/2021   HGB 13.9 06/28/2021   HCT 41.4 06/28/2021   MCV 91.4 06/28/2021   PLT 166 06/28/2021   Recent Labs    05/15/21 1544 06/06/21 0941 06/21/21 0812 06/28/21 0813  NA 140 141 137 137  K 3.9 4.0 4.9 3.8  CL 110 106 110 108  CO2 19*  --  18* 19*  GLUCOSE 124* 132* 147* 122*  BUN 21 20 33* 26*  CREATININE 0.91 1.00 0.94 0.93  CALCIUM 9.7  --  9.9 8.9  GFRNONAA >60  --  >60 >60  PROT 7.6  --  7.6 6.7  ALBUMIN 4.5  --  4.7 3.9  AST 17  --  15 13*  ALT 16  --  16 12  ALKPHOS 46  --  49 54  BILITOT 0.4  --  0.4 0.5    Iron/TIBC/Ferritin/ %Sat    Component Value Date/Time   IRON 113 12/08/2019 1015   TIBC 317 12/08/2019 1015   FERRITIN 67 12/08/2019 1015   IRONPCTSAT 36 12/08/2019 1015       RADIOGRAPHIC STUDIES: I have personally reviewed the radiological images as listed and agreed with the findings in the report. DG Chest Port 1 View  Result Date: 06/06/2021 CLINICAL DATA:  Post Port-A-Cath placement EXAM: PORTABLE CHEST 1 VIEW COMPARISON:  Portable exam 1234 hours without priors for comparison FINDINGS: RIGHT jugular Port-A-Cath with tip projecting over SVC. Normal heart size, mediastinal contours, and pulmonary vascularity. Atherosclerotic calcification aorta. Lungs clear. No acute infiltrate, pleural effusion, or  pneumothorax. Osseous structures unremarkable. IMPRESSION: No pneumothorax following RIGHT jugular Port-A-Cath insertion. Aortic Atherosclerosis (ICD10-I70.0). Electronically Signed   By: Lavonia Dana M.D.   On: 06/06/2021 13:24   DG C-Arm 1-60 Min-No Report  Result Date: 06/06/2021 Fluoroscopy was utilized by the requesting physician.  No radiographic interpretation.   ECHOCARDIOGRAM COMPLETE  Result Date: 06/13/2021    ECHOCARDIOGRAM REPORT   Patient Name:   KAVINA CANTAVE Date of Exam: 06/13/2021 Medical Rec #:  [id number]       Height:       61.0 in Accession #:    [id number]      Weight:       155.0 lb Date of Birth:  1951-11-15       BSA:          1.695 m Patient Age:    8 years        BP:           153/76 mmHg Patient Gender: F               HR:           76 bpm. Exam Location:  [facility] Procedure: 2D Echo, Color Doppler, Cardiac Doppler and Strain Analysis Indications:     Z09 Chemotherapy  History:  Patient has prior history of Echocardiogram examinations, most                  recent 06/07/2019. CAD, Stroke; Risk Factors:Diabetes and                  Hypertension. Invasive carcinoma of breast.  Sonographer:     Charmayne Sheer RDCS (AE) Referring Phys:  [id number] [place] Diagnosing Phys: Ida Rogue MD  Sonographer Comments: Global longitudinal strain was attempted. IMPRESSIONS  1. Left ventricular ejection fraction, by estimation, is 60 to 65%. The left ventricle has normal function. The left ventricle has no regional wall motion abnormalities. Left ventricular diastolic parameters are consistent with Grade I diastolic dysfunction (impaired relaxation). The average left ventricular global longitudinal strain is -22.5 %. The global longitudinal strain is normal.  2. Right ventricular systolic function is normal. The right ventricular size is normal. Tricuspid regurgitation signal is inadequate for assessing PA pressure. FINDINGS  Left Ventricle: Left ventricular ejection fraction, by estimation, is 60  to 65%. The left ventricle has normal function. The left ventricle has no regional wall motion abnormalities. The average left ventricular global longitudinal strain is -22.5 %. The global longitudinal strain is normal. The left ventricular internal cavity size was normal in size. There is no left ventricular hypertrophy. Left ventricular diastolic parameters are consistent with Grade I diastolic dysfunction (impaired relaxation). Right Ventricle: The right ventricular size is normal. No increase in right ventricular wall thickness. Right ventricular systolic function is normal. Tricuspid regurgitation signal is inadequate for assessing PA pressure. Left Atrium: Left atrial size was normal in size. Right Atrium: Right atrial size was normal in size. Pericardium: There is no evidence of pericardial effusion. Mitral Valve: The mitral valve is normal in structure. No evidence of mitral valve regurgitation. No evidence of mitral valve stenosis. MV peak gradient, 4.0 mmHg. The mean mitral valve gradient is 2.0 mmHg. Tricuspid Valve: The tricuspid valve is normal in structure. Tricuspid valve regurgitation is not demonstrated. No evidence of tricuspid stenosis. Aortic Valve: The aortic valve was not well visualized. There is mild calcification of the aortic valve. Aortic valve regurgitation is not visualized. Mild aortic valve sclerosis is present, with no evidence of aortic valve stenosis. Aortic valve mean gradient measures 5.0 mmHg. Aortic valve peak gradient measures 9.1 mmHg. Aortic valve area, by VTI measures 2.47 cm. Pulmonic Valve: The pulmonic valve was normal in structure. Pulmonic valve regurgitation is not visualized. No evidence of pulmonic stenosis. Aorta: The aortic root is normal in size and structure. Venous: The inferior vena cava is normal in size with greater than 50% respiratory variability, suggesting right atrial pressure of 3 mmHg. IAS/Shunts: No atrial level shunt detected by color flow Doppler.   LEFT VENTRICLE PLAX 2D LVIDd:         3.50 cm  Diastology LVIDs:         2.60 cm  LV e' medial:    7.29 cm/s LV PW:         1.00 cm  LV E/e' medial:  10.3 LV IVS:        0.70 cm  LV e' lateral:   8.92 cm/s LVOT diam:     1.80 cm  LV E/e' lateral: 8.4 LV SV:         67 LV SV Index:   40       2D Longitudinal Strain LVOT Area:     2.54 cm 2D Strain GLS Avg:     -22.5 %  RIGHT VENTRICLE RV Basal diam:  3.00 cm TAPSE (M-mode): 2.4 cm LEFT ATRIUM             Index       RIGHT ATRIUM          Index LA diam:        3.00 cm 1.77 cm/m  RA Area:     7.18 cm LA Vol (A2C):   21.4 ml 12.63 ml/m RA Volume:   11.30 ml 6.67 ml/m LA Vol (A4C):   20.9 ml 12.33 ml/m LA Biplane Vol: 21.3 ml 12.57 ml/m  AORTIC VALVE                    PULMONIC VALVE AV Area (Vmax):    2.01 cm     PV Vmax:       0.90 m/s AV Area (Vmean):   2.20 cm     PV Vmean:      64.000 cm/s AV Area (VTI):     2.47 cm     PV VTI:        0.164 m AV Vmax:           151.00 cm/s  PV Peak grad:  3.2 mmHg AV Vmean:          105.000 cm/s PV Mean grad:  2.0 mmHg AV VTI:            0.273 m AV Peak Grad:      9.1 mmHg AV Mean Grad:      5.0 mmHg LVOT Vmax:         119.00 cm/s LVOT Vmean:        90.900 cm/s LVOT VTI:          0.265 m LVOT/AV VTI ratio: 0.97  AORTA Ao Root diam: 2.30 cm MITRAL VALVE MV Area (PHT): 3.01 cm     SHUNTS MV Area VTI:   2.69 cm     Systemic VTI:  0.26 m MV Peak grad:  4.0 mmHg     Systemic Diam: 1.80 cm MV Mean grad:  2.0 mmHg MV Vmax:       1.00 m/s MV Vmean:      61.8 cm/s MV Decel Time: 252 msec MV E velocity: 75.00 cm/s MV A velocity: 101.00 cm/s MV E/A ratio:  0.74 Ida Rogue MD Electronically signed by Ida Rogue MD Signature Date/Time: 06/13/2021/5:34:59 PM    Final       ASSESSMENT & PLAN:  1. Malignant neoplasm of left breast in female, estrogen receptor positive, unspecified site of breast ([place])   2. Chemotherapy induced nausea and vomiting   Cancer Staging Invasive carcinoma of breast ([place]) Staging form: Breast,  AJCC 8th Edition - Clinical stage from 05/16/2021: Stage IIB (cT3, cN0, cM0, G3, ER+, PR+, HER2-) - Signed by [name], MD on 06/03/2021  #Left breast invasive carcinoma, multiple sites.  ER 90%, PR 11-50% positive, HER2 negative by IHC cT3 N0 On neoadjuvant chemotherapy .  Labs reviewed and discussed with patient Overall she tolerates well except chemotherapy induced nausea and vomiting. Discussed with her about the instructions of antiemetics. Patient will receive 1 L of IV fluid today.  She currently does not have any nausea or vomiting.  Follow up in 1 week for cycle 2 ddAC.   All questions were answered. The patient knows to call the clinic with any problems questions or concerns.  cc McLean-Scocuzza, Olam Idler, MD, PhD Hematology [facility] [place]- [phone] 06/28/2021

## 2021-06-28 NOTE — Progress Notes (Signed)
Pt received IV hydration today. Discharged to home accompanied by her husband.

## 2021-06-28 NOTE — Progress Notes (Signed)
Patient had an episode of diarrhea a few days ago and had vomiting last night.

## 2021-07-01 ENCOUNTER — Ambulatory Visit (INDEPENDENT_AMBULATORY_CARE_PROVIDER_SITE_OTHER): Payer: PPO | Admitting: Pharmacist

## 2021-07-01 DIAGNOSIS — E1159 Type 2 diabetes mellitus with other circulatory complications: Secondary | ICD-10-CM | POA: Diagnosis not present

## 2021-07-01 DIAGNOSIS — C50919 Malignant neoplasm of unspecified site of unspecified female breast: Secondary | ICD-10-CM | POA: Diagnosis not present

## 2021-07-01 DIAGNOSIS — I152 Hypertension secondary to endocrine disorders: Secondary | ICD-10-CM | POA: Diagnosis not present

## 2021-07-01 DIAGNOSIS — Z8673 Personal history of transient ischemic attack (TIA), and cerebral infarction without residual deficits: Secondary | ICD-10-CM

## 2021-07-01 DIAGNOSIS — E1165 Type 2 diabetes mellitus with hyperglycemia: Secondary | ICD-10-CM | POA: Diagnosis not present

## 2021-07-01 LAB — SURGICAL PATHOLOGY

## 2021-07-01 NOTE — Patient Instructions (Signed)
Visit Information  PATIENT GOALS:  Goals Addressed               This Visit's Progress     Patient Stated     Disease Self Management (pt-stated)        Patient Goals/Self-Care Activities Over the next 90 days, patient will:  - take medications as prescribed check blood glucose three times daily using CGM, document, and provide at future appointments         Patient verbalizes understanding of instructions provided today and agrees to view in [place].    Plan: Telephone follow up appointment with care management team member scheduled for:  ~ 8 weeks  Catie [name], PharmD, [place], [place] Clinical Pharmacist [employer] [facility] [employer] [phone]

## 2021-07-01 NOTE — Chronic Care Management (AMB) (Signed)
Chronic Care Management Pharmacy Note  07/01/2021 Name:  Tammy Nunez MRN:  [id number] DOB:  Mar 21, 1951   Subjective: Tammy Nunez is an 70 y.o. year old female who is a primary patient of McLean-Scocuzza, Nino Glow, MD.  The CCM team was consulted for assistance with disease management and care coordination needs.    Engaged with patient by telephone for follow up visit in response to provider referral for pharmacy case management and/or care coordination services.   Consent to Services:  The patient was given information about Chronic Care Management services, agreed to services, and gave verbal consent prior to initiation of services.  Please see initial visit note for detailed documentation.   Patient Care Team: McLean-Scocuzza, Nino Glow, MD as PCP - General (Internal Medicine) De Hollingshead, RPH-CPP as Pharmacist (Pharmacist) Theodore Demark, RN as Oncology Nurse Navigator  Recent consult visits: 6/22- onc Tasia Catchings - ER/PR +, HER2- breast cancer of L breast; needs mastectomy, neoadjuvant chemo 7/11 - neurology Potter- continue DAPT 7/14 - port-a-cath placed; 7/21 - baseline ECHO  7/29 - onc Yu - chemo started with doxorubicin and cyclophosphamide   Objective:  Lab Results  Component Value Date   CREATININE 0.93 06/28/2021   CREATININE 0.94 06/21/2021   CREATININE 1.00 06/06/2021    Lab Results  Component Value Date   HGBA1C 7.5 (H) 01/03/2021   Last diabetic Eye exam:  Lab Results  Component Value Date/Time   HMDIABEYEEXA No Retinopathy 03/04/2021 12:00 AM   HMDIABEYEEXA No Retinopathy 03/04/2021 12:00 AM    Last diabetic Foot exam: No results found for: HMDIABFOOTEX      Component Value Date/Time   CHOL 138 01/03/2021 0845   TRIG 80.0 01/03/2021 0845   HDL 41.20 01/03/2021 0845   CHOLHDL 3 01/03/2021 0845   VLDL 16.0 01/03/2021 0845   LDLCALC 81 01/03/2021 0845    Hepatic Function Latest Ref Rng & Units 06/28/2021 06/21/2021 05/15/2021  Total Protein  6.5 - 8.1 g/dL 6.7 7.6 7.6  Albumin 3.5 - 5.0 g/dL 3.9 4.7 4.5  AST 15 - 41 U/L 13(L) 15 17  ALT 0 - 44 U/L 12 16 16   Alk Phosphatase 38 - 126 U/L 54 49 46  Total Bilirubin 0.3 - 1.2 mg/dL 0.5 0.4 0.4    Lab Results  Component Value Date/Time   TSH 2.67 01/03/2021 08:45 AM   TSH 3.52 12/08/2019 10:15 AM    CBC Latest Ref Rng & Units 06/28/2021 06/21/2021 06/06/2021  WBC 4.0 - 10.5 K/uL 3.5(L) 6.5 -  Hemoglobin 12.0 - 15.0 g/dL 13.9 14.0 15.3(H)  Hematocrit 36.0 - 46.0 % 41.4 42.6 45.0  Platelets 150 - 400 K/uL 166 220 -    Lab Results  Component Value Date/Time   VD25OH 65.22 08/02/2020 09:47 AM   VD25OH 21.69 (L) 12/08/2019 10:15 AM    Clinical ASCVD: Yes  The ASCVD Risk score Mikey Bussing DC Jr., et al., 2013) failed to calculate for the following reasons:   The patient has a prior MI or stroke diagnosis      Social History   Tobacco Use  Smoking Status Former   Packs/day: 0.50   Years: 15.00   Pack years: 7.50   Types: Cigarettes   Quit date: 04/11/2019   Years since quitting: 2.2  Smokeless Tobacco Never   BP Readings from Last 3 Encounters:  06/28/21 132/60  06/21/21 132/62  06/06/21 (!) 153/76   Pulse Readings from Last 3 Encounters:  06/28/21 81  06/21/21 76  06/06/21 79   Wt Readings from Last 3 Encounters:  06/28/21 144 lb 6.4 oz (65.5 kg)  06/21/21 145 lb 4.8 oz (65.9 kg)  06/06/21 155 lb (70.3 kg)    Assessment: Review of patient past medical history, allergies, medications, health status, including review of consultants reports, laboratory and other test data, was performed as part of comprehensive evaluation and provision of chronic care management services.   SDOH:  (Social Determinants of Health) assessments and interventions performed:  SDOH Interventions    Flowsheet Row Most Recent Value  SDOH Interventions   Financial Strain Interventions Intervention Not Indicated       CCM Care Plan  No Known Allergies  Medications Reviewed Today      Reviewed by De Hollingshead, RPH-CPP (Pharmacist) on 07/01/21 at 1125  Med List Status: <None>   Medication Order Taking? Sig Documenting Provider Last Dose Status Informant  amLODipine (NORVASC) 10 MG tablet [id number] Yes Take 1 tablet by mouth once daily McLean-Scocuzza, Nino Glow, MD Taking Active   aspirin EC 81 MG EC tablet [id number] Yes Take 1 tablet (81 mg total) by mouth daily. Hillary Bow, MD Taking Active Spouse/Significant Other  atorvastatin (LIPITOR) 80 MG tablet [id number] Yes Take 1 tablet (80 mg total) by mouth daily. McLean-Scocuzza, Nino Glow, MD Taking Active Spouse/Significant Other  blood glucose meter kit and supplies [id number] Yes 1 each by Other route 4 (four) times daily. Dispense based on patient and insurance preference. Four times daily as directed. (FOR ICD-10 E10.9, E11.9). [name], FNP Taking Active Spouse/Significant Other  carvedilol (COREG) 25 MG tablet [id number] Yes Take 1 tablet (25 mg total) by mouth 2 (two) times daily with a meal. McLean-Scocuzza, Nino Glow, MD Taking Active Spouse/Significant Other  clopidogrel (PLAVIX) 75 MG tablet [id number] Yes Take 1 tablet (75 mg total) by mouth daily. Further refills [facility] neurology McLean-Scocuzza, Nino Glow, MD Taking Active Spouse/Significant Other  Continuous Blood Gluc Sensor (FREESTYLE LIBRE 2 SENSOR) MISC [id number] Yes Use to check glucose at least every 8 hours McLean-Scocuzza, Nino Glow, MD Taking Active Spouse/Significant Other  dexamethasone (DECADRON) 4 MG tablet [id number] Yes Take 2 tablets (8 mg total) by mouth daily. Take daily for 3 days after chemo. Take with food. [name], MD Taking Active   empagliflozin (JARDIANCE) 25 MG TABS tablet [id number] Yes Take 1 tablet (25 mg total) by mouth daily. McLean-Scocuzza, Nino Glow, MD Taking Active Spouse/Significant Other  lidocaine-prilocaine (EMLA) cream [id number] Yes Apply to affected area once [name], MD Taking Active   lisinopril (ZESTRIL) 40 MG tablet  [id number] Yes Take 1 tablet (40 mg total) by mouth daily. McLean-Scocuzza, Nino Glow, MD Taking Active Spouse/Significant Other  metFORMIN (GLUCOPHAGE XR) 500 MG 24 hr tablet [id number] Yes Take 2 tablets (1,000 mg total) by mouth daily with breakfast.  Patient taking differently: Take 500 mg by mouth 2 (two) times daily.   McLean-Scocuzza, Nino Glow, MD Taking Active   ondansetron [facility]) 8 MG tablet [id number] Yes Take 1 tablet (8 mg total) by mouth 2 (two) times daily as needed. Start on the third day after chemotherapy. [name], MD Taking Active   potassium chloride SA (KLOR-CON M20) 20 MEQ tablet [id number] Yes Take 1 tablet (20 mEq total) by mouth daily. McLean-Scocuzza, Nino Glow, MD Taking Active Spouse/Significant Other  prochlorperazine (COMPAZINE) 10 MG tablet [id number] Yes Take 1 tablet (10 mg total) by mouth every 6 (six) hours as needed (Nausea or vomiting). [name], MD Taking Active   vitamin B-12 (  CYANOCOBALAMIN) 1000 MCG tablet [id number] Yes Take 1 tablet (1,000 mcg total) by mouth daily. Bary Leriche, PA-C Taking Active Spouse/Significant Other  Vitamin D, Cholecalciferol, 50 MCG (2000 UT) CAPS [id number] Yes Take 2,000 Units by mouth. [provider] Taking Active Spouse/Significant Other            Patient Active Problem List   Diagnosis Date Noted   Chemotherapy induced nausea and vomiting 06/28/2021   Goals of care, counseling/discussion 05/16/2021   Breast cancer in female [facility]) 05/08/2021   Invasive carcinoma of breast ([place]) 05/08/2021   Abnormal mammogram of left breast 04/25/2021   Obesity, diabetes, and hypertension syndrome ([place]) 10/11/2020   Hypertension associated with diabetes ([place]) 10/11/2020   Cataract of both eyes 12/28/2019   Vitamin D deficiency 12/11/2019   History of stroke 09/07/2019   Left hemiparesis ([place]) 09/07/2019   Carotid stenosis, symptomatic, with infarction ([place]) 07/20/2019   Type 2 diabetes mellitus with hyperglycemia, without  long-term current use of insulin ([place])    AKI (acute kidney injury) ([place])    Essential hypertension    Seizures ([place])    New onset type 2 diabetes mellitus ([place])     Immunization History  Administered Date(s) Administered   PFIZER Comirnaty(Gray Top)Covid-19 Tri-Sucrose Vaccine 03/26/2021   PFIZER(Purple Top)SARS-COV-2 Vaccination 01/11/2020, 02/01/2020, 09/04/2020   Pneumococcal Conjugate-13 10/11/2020    Conditions to be addressed/monitored: HTN, HLD, and DMII  Care Plan : Medication Management  Updates made by De Hollingshead, RPH-CPP since 07/01/2021 12:00 AM     Problem: Diabetes, Hx CVA, HTN, HLD      Long-Range Goal: Disease State Management   This Visit's Progress: On track  Recent Progress: On track  Priority: High  Note:   Current Barriers:  Unable to independently monitor therapeutic efficacy Unable to achieve control of cardiovascular risk reduction   Pharmacist Clinical Goal(s):  Over the next 90 days, patient will achieve adherence to monitoring guidelines and medication adherence to achieve therapeutic efficacy. Over the next 90 days, achieve control of diabetes as evidenced by improvement in A1c through collaboration with PharmD and provider.   Interventions: 1:1 collaboration with McLean-Scocuzza, Nino Glow, MD regarding development and update of comprehensive plan of care as evidenced by provider attestation and co-signature Inter-disciplinary care team collaboration (see longitudinal plan of care) Comprehensive medication review performed; medication list updated in electronic medical record  Diabetes: Uncontrolled but improved per SMBG; current treatment: metformin XR 500 mg BID, Jardiance 25 mg daily;  Current glucose readings: utilizing Libre 2 CGM; does not have reader beside her right now. Reports readings are in the 90-120s. Reports an episode of low blood sugars to 68. She was asymptomatic. Advised that occasional readings in the 60s is normal,  and as long as asymptomatic, she can turn off the <70 low glucose alarm. Advised on how to treat lows if she does feel symptomatic.  Discussed that if she has post-chemo days where she generally experiences nausea/vomiting and there is a worry of dehydration to HOLD Jardiance to reduce risk of euglycemic DKA. Patient verbalized understanding.   Hypertension: Controlled per recent clinic readings; current treatment: carvedilol 25 mg BID; lisinopril 40 mg QAM, amlodipine 10 mg QAM Denies any concerns with lightheadedness, dizziness, or any other s/sx hypotension.  Recommended to continue current regimen  Hyperlipidemia, secondary ASCVD risk reduction Uncontrolled but likely improved d/t medication change; current treatment: atorvastatin 80 mg daily; follows w/ Dr. Mel[facility] Antiplatelet therapy: aspirin 81 mg daily, clopidogrel 75 mg daily  Recommended to continue current regimen.     Breast Cancer of L breast (ER/PR +, HER2 -) Plan for neoadjuvant chemo w/ doxorubicin and cyclophosphamide followed by mastectomy.  Nausea/vomiting: dexamethasone 8 mg, ondansetron 8 mg BID scheduled for 3 days after chemo; prochlorperizine 10 mg PRN nausea Reports that she was tired for a few days after her first treatment, but feeling better today Advised holding Jardiance on days with risk of dehydration as above Reiterated that dexamethasone can impact blood sugars. Continue current regimen at this time, along with collaboration with cardiology.   Supplements: Vitamin B12, Vitamin D. Continue current regimen  Patient Goals/Self-Care Activities Over the next 90 days, patient will:  - take medications as prescribed check blood glucose three times daily using CGM, document, and provide at future appointments  Follow Up Plan: Telephone follow up appointment with care management team member scheduled for: ~ 8 weeks        Medication Assistance: None required.  Patient affirms current coverage meets  needs.  Patient's preferred pharmacy is:  [facility] [address] ([address]), [address] [place] [address] Phone: [phone] Fax: [phone]   Follow Up:  Patient agrees to Care Plan and Follow-up.  Plan: Telephone follow up appointment with care management team member scheduled for:  ~ 8 weeks  Catie [name], PharmD, [place], [place] Clinical Pharmacist [employer] [facility] [employer] [phone]

## 2021-07-03 ENCOUNTER — Other Ambulatory Visit: Payer: Self-pay | Admitting: Internal Medicine

## 2021-07-03 DIAGNOSIS — I10 Essential (primary) hypertension: Secondary | ICD-10-CM

## 2021-07-05 ENCOUNTER — Encounter: Payer: Self-pay | Admitting: Oncology

## 2021-07-05 ENCOUNTER — Inpatient Hospital Stay (HOSPITAL_BASED_OUTPATIENT_CLINIC_OR_DEPARTMENT_OTHER): Payer: PPO | Admitting: Oncology

## 2021-07-05 ENCOUNTER — Inpatient Hospital Stay: Payer: PPO

## 2021-07-05 ENCOUNTER — Other Ambulatory Visit: Payer: Self-pay

## 2021-07-05 VITALS — BP 138/72 | HR 84 | Temp 98.7°F | Resp 16 | Wt 146.2 lb

## 2021-07-05 DIAGNOSIS — T451X5A Adverse effect of antineoplastic and immunosuppressive drugs, initial encounter: Secondary | ICD-10-CM | POA: Diagnosis not present

## 2021-07-05 DIAGNOSIS — D696 Thrombocytopenia, unspecified: Secondary | ICD-10-CM

## 2021-07-05 DIAGNOSIS — R112 Nausea with vomiting, unspecified: Secondary | ICD-10-CM

## 2021-07-05 DIAGNOSIS — Z170 Estrogen receptor positive status [ER+]: Secondary | ICD-10-CM | POA: Diagnosis not present

## 2021-07-05 DIAGNOSIS — C50912 Malignant neoplasm of unspecified site of left female breast: Secondary | ICD-10-CM

## 2021-07-05 DIAGNOSIS — Z5111 Encounter for antineoplastic chemotherapy: Secondary | ICD-10-CM | POA: Diagnosis not present

## 2021-07-05 LAB — CBC WITH DIFFERENTIAL/PLATELET
Abs Immature Granulocytes: 3.32 10*3/uL — ABNORMAL HIGH (ref 0.00–0.07)
Basophils Absolute: 0 10*3/uL (ref 0.0–0.1)
Basophils Relative: 0 %
Eosinophils Absolute: 0 10*3/uL (ref 0.0–0.5)
Eosinophils Relative: 0 %
HCT: 37.6 % (ref 36.0–46.0)
Hemoglobin: 12.4 g/dL (ref 12.0–15.0)
Immature Granulocytes: 14 %
Lymphocytes Relative: 10 %
Lymphs Abs: 2.3 10*3/uL (ref 0.7–4.0)
MCH: 30.5 pg (ref 26.0–34.0)
MCHC: 33 g/dL (ref 30.0–36.0)
MCV: 92.6 fL (ref 80.0–100.0)
Monocytes Absolute: 2.1 10*3/uL — ABNORMAL HIGH (ref 0.1–1.0)
Monocytes Relative: 9 %
Neutro Abs: 15.4 10*3/uL — ABNORMAL HIGH (ref 1.7–7.7)
Neutrophils Relative %: 67 %
Platelets: 81 10*3/uL — ABNORMAL LOW (ref 150–400)
RBC: 4.06 MIL/uL (ref 3.87–5.11)
RDW: 13.5 % (ref 11.5–15.5)
Smear Review: NORMAL
WBC: 23.2 10*3/uL — ABNORMAL HIGH (ref 4.0–10.5)
nRBC: 0.4 % — ABNORMAL HIGH (ref 0.0–0.2)

## 2021-07-05 LAB — COMPREHENSIVE METABOLIC PANEL
ALT: 18 U/L (ref 0–44)
AST: 18 U/L (ref 15–41)
Albumin: 3.9 g/dL (ref 3.5–5.0)
Alkaline Phosphatase: 78 U/L (ref 38–126)
Anion gap: 9 (ref 5–15)
BUN: 17 mg/dL (ref 8–23)
CO2: 23 mmol/L (ref 22–32)
Calcium: 10 mg/dL (ref 8.9–10.3)
Chloride: 108 mmol/L (ref 98–111)
Creatinine, Ser: 0.92 mg/dL (ref 0.44–1.00)
GFR, Estimated: >60 mL/min (ref >60)
Glucose, Bld: 170 mg/dL — ABNORMAL HIGH (ref 70–99)
Potassium: 3.8 mmol/L (ref 3.5–5.1)
Sodium: 140 mmol/L (ref 135–145)
Total Bilirubin: 0.5 mg/dL (ref 0.3–1.2)
Total Protein: 6.6 g/dL (ref 6.5–8.1)

## 2021-07-05 MED ORDER — HEPARIN SOD (PORK) LOCK FLUSH 100 UNIT/ML IV SOLN
500.0000 [IU] | Freq: Once | INTRAVENOUS | Status: AC
  Administered 2021-07-05: 500 [IU] via INTRAVENOUS
  Filled 2021-07-05: qty 5

## 2021-07-05 NOTE — Progress Notes (Signed)
Hematology/Oncology Progress note [facility] [phone] [phone]   Patient Care Team: McLean-Scocuzza, Nino Glow, MD as PCP - General (Internal Medicine) De Hollingshead, [place] as Pharmacist (Pharmacist) Theodore Demark, RN as Oncology Nurse Navigator  REFERRING PROVIDER: McLean-Scocuzza, Olivia Mackie *  CHIEF COMPLAINTS/REASON FOR VISIT:  multifocal left breast cancer  HISTORY OF PRESENTING ILLNESS:   Tammy Nunez is a  70 y.o.  female with Delta listed below was seen in consultation at the request of  McLean-Scocuzza, Olivia Mackie *  for evaluation of multifocal  04/24/2021, screening mammogram showed large asymmetry in the superior aspect of the breast with associated calcifications and possible masses and a subtle distortions.  No suspicious findings for malignancy right breast.  05/02/2021 left breast diagnostic mammogram and ultrasound showed multiple irregular mass in the left breast. Mammogram mass 1 irregular mass associated with distortion in the upper breast at middle depth, multiple adjacent and possible continuous irregular masses, conglomeration of masses measures at least 5.6 cm.  Associated pleomorphic calcifications extending at least 5 x 3.9 x 3.7 cm Mass 2, 1 cm mass in the upper breast at the middle to posterior depth. Mass 3, 5 mm irregular mass in the upper breast at posterior depth. In total, mass 1-mass 3 span at least 8.2 cm in anterior-posterior extent.  By ultrasound  mass 1 12:00 5 cm from nipple, 2.7 x 1.5 x 4.3 cm Mass 2, 12:00 12 cm from nipple, 0.9 x 0.9 x 0.5 cm Mass 3   12:00 14 cm from nipple, 0.4 x 0.4 x 0.4 cm-uses 2.4 cm superior to mass to Mass 4   11:00  No suspicious left axillary adenopathy.   05/07/2021 -Ultrasound-guided breast biopsy Breast left 12:00 mass 5 cm from nipple biopsy showed invasive mammary carcinoma, no special type, grade 3, high-grade DCIS present, no LVI, ER 90%, PR 11-50%, HER2  negative by IHC Breast left 11:00 mass 10 cm from nipple biopsy showed invasive mammary carcinoma, no special type, grade 2, no DCIS/LVI identified.ER 90%, PR 11-50%, HER2 negative by IHC  Patient is married has no children.  Denies any hormone replacement, oral contraceptive use, family history of breast cancer.  Denies any chest radiation. Patient has a history of stroke with chronic residual weakness of the left side.  She walks with a cane.  05/22/2021 MRI breast showed that nonmass like malignancy spanning over 8 cm, abuting anterior aspect of pectoralis muscle. Discussed with Dr.Cintron and we agree that she will need mastectomy. There is concern of possible positive margin. I recommend neoadjuvant chemotherapy  # medi port placed on 06/06/2021. 06/13/2021 pre chemotherapy Echo. LVEF 60-65%.  Normal global longitudinal strain. Grade 1 diastolic dysfunction.   # 05/22/2021 MRI breast showed  Biopsy-proven malignancy within the UPPER LEFT breast spanning a distance of 8.3 x 4.2 x 4 cm nonmass like enhancement abuts the anterior aspect of the LEFT pectoralis muscle without gross invasion. Oncotype Dx 27. Discussed with Dr.Cintron, there is concern of positive margin.   INTERVAL HISTORY Tammy Nunez is a 70 y.o. female who has above history reviewed by me today presents for follow up visit for chemotherapy of breast cancer  She feels well today. No new complaints.     Review of Systems  Constitutional:  Negative for appetite change, chills, fatigue and fever.  HENT:   Negative for hearing loss and voice change.   Eyes:  Negative for eye problems.  Respiratory:  Negative for chest tightness and cough.   Cardiovascular:  Negative for chest pain.  Gastrointestinal:  Negative for abdominal distention, abdominal pain, blood in stool and nausea.  Endocrine: Negative for hot flashes.  Genitourinary:  Negative for difficulty urinating and frequency.   Musculoskeletal:  Negative for  arthralgias.  Skin:  Negative for itching and rash.  Neurological:  Negative for extremity weakness.  Hematological:  Negative for adenopathy. Does not bruise/bleed easily.  Psychiatric/Behavioral:  Negative for confusion.    MEDICAL HISTORY:  Past Medical History:  Diagnosis Date   Breast cancer in female [facility]) 05/08/2021   Chronic left shoulder pain    Coronary artery disease    Diabetes mellitus without complication (HCC)    Dysrhythmia    Hypertension    Paralysis (HCC)    weakness left upper amd lower extremity   PONV (postoperative nausea and vomiting)    Stroke [facility])     SURGICAL HISTORY: Past Surgical History:  Procedure Laterality Date   BREAST BIOPSY Left 05/07/2021   12:00 5cmfn vision clip path pending   BREAST BIOPSY Left 05/07/2021   11:00 10 cmfn mini cork clip path pending   CAROTID PTA/STENT INTERVENTION Left 07/20/2019   Procedure: CAROTID PTA/STENT INTERVENTION;  Surgeon: Katha Cabal, MD;  Location: [place] CV LAB;  Service: Cardiovascular;  Laterality: Left;   ECTOPIC PREGNANCY SURGERY     PORTACATH PLACEMENT N/A 06/06/2021   Procedure: INSERTION PORT-A-CATH;  Surgeon: Herbert Pun, MD;  Location: [facility] ORS;  Service: General;  Laterality: N/A;    SOCIAL HISTORY: Social History   Socioeconomic History   Marital status: Married    Spouse name: Not on file   Number of children: Not on file   Years of education: Not on file   Highest education level: Not on file  Occupational History   Not on file  Tobacco Use   Smoking status: Former    Packs/day: 0.50    Years: 15.00    Pack years: 7.50    Types: Cigarettes    Quit date: 04/11/2019    Years since quitting: 2.2   Smokeless tobacco: Never  Vaping Use   Vaping Use: Never used  Substance and Sexual Activity   Alcohol use: Never   Drug use: Never   Sexual activity: Not Currently  Other Topics Concern   Not on file  Social History Narrative   No kids    Married with  husband    Used to work [employer] broadcast assistant    Social Determinants of Radio broadcast assistant Strain: Low Risk    Difficulty of Paying Living Expenses: Not hard at all  Food Insecurity: Not on file  Transportation Needs: Not on file  Physical Activity: Sufficiently Active   Days of Exercise per Week: 7 days   Minutes of Exercise per Session: 30 min  Stress: Not on file  Social Connections: Not on file  Intimate Partner Violence: Not on file    FAMILY HISTORY: Family History  Problem Relation Age of Onset   Diabetes type II Mother    Hypertension Father    Heart attack Father    Diabetes type II Brother    Breast cancer Neg Hx     ALLERGIES:  has No Known Allergies.  MEDICATIONS:  Current Outpatient Medications  Medication Sig Dispense Refill   amLODipine (NORVASC) 10 MG tablet Take 1 tablet by mouth once daily 90 tablet 0   aspirin EC 81 MG EC tablet Take 1 tablet (81 mg total) by mouth daily.  atorvastatin (LIPITOR) 80 MG tablet Take 1 tablet (80 mg total) by mouth daily. 90 tablet 3   blood glucose meter kit and supplies 1 each by Other route 4 (four) times daily. Dispense based on patient and insurance preference. Four times daily as directed. (FOR ICD-10 E10.9, E11.9). 1 each 0   carvedilol (COREG) 25 MG tablet Take 1 tablet (25 mg total) by mouth 2 (two) times daily with a meal. 180 tablet 3   clopidogrel (PLAVIX) 75 MG tablet Take 1 tablet (75 mg total) by mouth daily. Further refills [facility] neurology 90 tablet 3   Continuous Blood Gluc Sensor (FREESTYLE LIBRE 2 SENSOR) MISC Use to check glucose at least every 8 hours 6 each 3   dexamethasone (DECADRON) 4 MG tablet Take 2 tablets (8 mg total) by mouth daily. Take daily for 3 days after chemo. Take with food. 30 tablet 1   empagliflozin (JARDIANCE) 25 MG TABS tablet Take 1 tablet (25 mg total) by mouth daily. 90 tablet 3   lidocaine-prilocaine (EMLA) cream Apply to affected area once 30 g 3   lisinopril  (ZESTRIL) 40 MG tablet Take 1 tablet (40 mg total) by mouth daily. 90 tablet 3   loratadine (CLARITIN) 10 MG tablet Take 10 mg by mouth daily.     metFORMIN (GLUCOPHAGE XR) 500 MG 24 hr tablet Take 2 tablets (1,000 mg total) by mouth daily with breakfast. (Patient taking differently: Take 500 mg by mouth 2 (two) times daily.) 180 tablet 3   ondansetron (ZOFRAN) 8 MG tablet Take 1 tablet (8 mg total) by mouth 2 (two) times daily as needed. Start on the third day after chemotherapy. 30 tablet 1   potassium chloride SA (KLOR-CON M20) 20 MEQ tablet Take 1 tablet (20 mEq total) by mouth daily. 90 tablet 3   prochlorperazine (COMPAZINE) 10 MG tablet Take 1 tablet (10 mg total) by mouth every 6 (six) hours as needed (Nausea or vomiting). 30 tablet 1   vitamin B-12 (CYANOCOBALAMIN) 1000 MCG tablet Take 1 tablet (1,000 mcg total) by mouth daily. 30 tablet 1   Vitamin D, Cholecalciferol, 50 MCG (2000 UT) CAPS Take 2,000 Units by mouth.     No current facility-administered medications for this visit.     PHYSICAL EXAMINATION: ECOG PERFORMANCE STATUS: 1 - Symptomatic but completely ambulatory Vitals:   07/05/21 0859  BP: 138/72  Pulse: 84  Resp: 16  Temp: 98.7 F (37.1 C)  SpO2: 100%   Filed Weights   07/05/21 0859  Weight: 146 lb 3.2 oz (66.3 kg)    Physical Exam Constitutional:      General: She is not in acute distress. HENT:     Head: Normocephalic and atraumatic.  Eyes:     General: No scleral icterus. Cardiovascular:     Rate and Rhythm: Normal rate and regular rhythm.     Heart sounds: Normal heart sounds.  Pulmonary:     Effort: Pulmonary effort is normal. No respiratory distress.     Breath sounds: No wheezing.  Abdominal:     General: Bowel sounds are normal. There is no distension.     Palpations: Abdomen is soft.  Musculoskeletal:        General: No deformity. Normal range of motion.     Cervical back: Normal range of motion and neck supple.  Skin:    General: Skin is  warm and dry.     Findings: No erythema or rash.  Neurological:     Mental Status: She  is alert and oriented to person, place, and time. Mental status is at baseline.     Comments: Chronic left upper and lower extremity weakness.  Psychiatric:        Mood and Affect: Mood normal.        LABORATORY DATA:  I have reviewed the data as listed Lab Results  Component Value Date   WBC 23.2 (H) 07/05/2021   HGB 12.4 07/05/2021   HCT 37.6 07/05/2021   MCV 92.6 07/05/2021   PLT 81 (L) 07/05/2021   Recent Labs    06/21/21 0812 06/28/21 0813 07/05/21 0814  NA 137 137 140  K 4.9 3.8 3.8  CL 110 108 108  CO2 18* 19* 23  GLUCOSE 147* 122* 170*  BUN 33* 26* 17  CREATININE 0.94 0.93 0.92  CALCIUM 9.9 8.9 10.0  GFRNONAA >60 >60 >60  PROT 7.6 6.7 6.6  ALBUMIN 4.7 3.9 3.9  AST 15 13* 18  ALT 16 12 18   ALKPHOS 49 54 78  BILITOT 0.4 0.5 0.5    Iron/TIBC/Ferritin/ %Sat    Component Value Date/Time   IRON 113 12/08/2019 1015   TIBC 317 12/08/2019 1015   FERRITIN 67 12/08/2019 1015   IRONPCTSAT 36 12/08/2019 1015       RADIOGRAPHIC STUDIES: I have personally reviewed the radiological images as listed and agreed with the findings in the report. DG Chest Port 1 View  Result Date: 06/06/2021 CLINICAL DATA:  Post Port-A-Cath placement EXAM: PORTABLE CHEST 1 VIEW COMPARISON:  Portable exam 1234 hours without priors for comparison FINDINGS: RIGHT jugular Port-A-Cath with tip projecting over SVC. Normal heart size, mediastinal contours, and pulmonary vascularity. Atherosclerotic calcification aorta. Lungs clear. No acute infiltrate, pleural effusion, or pneumothorax. Osseous structures unremarkable. IMPRESSION: No pneumothorax following RIGHT jugular Port-A-Cath insertion. Aortic Atherosclerosis (ICD10-I70.0). Electronically Signed   By: Lavonia Dana M.D.   On: 06/06/2021 13:24   DG C-Arm 1-60 Min-No Report  Result Date: 06/06/2021 Fluoroscopy was utilized by the requesting physician.   No radiographic interpretation.   ECHOCARDIOGRAM COMPLETE  Result Date: 06/13/2021    ECHOCARDIOGRAM REPORT   Patient Name:   Tammy Nunez Date of Exam: 06/13/2021 Medical Rec #:  [id number]       Height:       61.0 in Accession #:    [id number]      Weight:       155.0 lb Date of Birth:  05-27-1951       BSA:          1.695 m Patient Age:    70 years        BP:           153/76 mmHg Patient Gender: F               HR:           76 bpm. Exam Location:  [facility] Procedure: 2D Echo, Color Doppler, Cardiac Doppler and Strain Analysis Indications:     Z09 Chemotherapy  History:         Patient has prior history of Echocardiogram examinations, most                  recent 06/07/2019. CAD, Stroke; Risk Factors:Diabetes and                  Hypertension. Invasive carcinoma of breast.  Sonographer:     Charmayne Sheer RDCS (AE) Referring Phys:  [id number] [place] Diagnosing Phys: Ida Rogue MD  Sonographer Comments: Global longitudinal strain was attempted. IMPRESSIONS  1. Left ventricular ejection fraction, by estimation, is 60 to 65%. The left ventricle has normal function. The left ventricle has no regional wall motion abnormalities. Left ventricular diastolic parameters are consistent with Grade I diastolic dysfunction (impaired relaxation). The average left ventricular global longitudinal strain is -22.5 %. The global longitudinal strain is normal.  2. Right ventricular systolic function is normal. The right ventricular size is normal. Tricuspid regurgitation signal is inadequate for assessing PA pressure. FINDINGS  Left Ventricle: Left ventricular ejection fraction, by estimation, is 60 to 65%. The left ventricle has normal function. The left ventricle has no regional wall motion abnormalities. The average left ventricular global longitudinal strain is -22.5 %. The global longitudinal strain is normal. The left ventricular internal cavity size was normal in size. There is no left ventricular hypertrophy. Left ventricular  diastolic parameters are consistent with Grade I diastolic dysfunction (impaired relaxation). Right Ventricle: The right ventricular size is normal. No increase in right ventricular wall thickness. Right ventricular systolic function is normal. Tricuspid regurgitation signal is inadequate for assessing PA pressure. Left Atrium: Left atrial size was normal in size. Right Atrium: Right atrial size was normal in size. Pericardium: There is no evidence of pericardial effusion. Mitral Valve: The mitral valve is normal in structure. No evidence of mitral valve regurgitation. No evidence of mitral valve stenosis. MV peak gradient, 4.0 mmHg. The mean mitral valve gradient is 2.0 mmHg. Tricuspid Valve: The tricuspid valve is normal in structure. Tricuspid valve regurgitation is not demonstrated. No evidence of tricuspid stenosis. Aortic Valve: The aortic valve was not well visualized. There is mild calcification of the aortic valve. Aortic valve regurgitation is not visualized. Mild aortic valve sclerosis is present, with no evidence of aortic valve stenosis. Aortic valve mean gradient measures 5.0 mmHg. Aortic valve peak gradient measures 9.1 mmHg. Aortic valve area, by VTI measures 2.47 cm. Pulmonic Valve: The pulmonic valve was normal in structure. Pulmonic valve regurgitation is not visualized. No evidence of pulmonic stenosis. Aorta: The aortic root is normal in size and structure. Venous: The inferior vena cava is normal in size with greater than 50% respiratory variability, suggesting right atrial pressure of 3 mmHg. IAS/Shunts: No atrial level shunt detected by color flow Doppler.  LEFT VENTRICLE PLAX 2D LVIDd:         3.50 cm  Diastology LVIDs:         2.60 cm  LV e' medial:    7.29 cm/s LV PW:         1.00 cm  LV E/e' medial:  10.3 LV IVS:        0.70 cm  LV e' lateral:   8.92 cm/s LVOT diam:     1.80 cm  LV E/e' lateral: 8.4 LV SV:         67 LV SV Index:   40       2D Longitudinal Strain LVOT Area:     2.54 cm  2D Strain GLS Avg:     -22.5 %  RIGHT VENTRICLE RV Basal diam:  3.00 cm TAPSE (M-mode): 2.4 cm LEFT ATRIUM             Index       RIGHT ATRIUM          Index LA diam:        3.00 cm 1.77 cm/m  RA Area:     7.18 cm LA Vol (A2C):   21.4 ml  12.63 ml/m RA Volume:   11.30 ml 6.67 ml/m LA Vol (A4C):   20.9 ml 12.33 ml/m LA Biplane Vol: 21.3 ml 12.57 ml/m  AORTIC VALVE                    PULMONIC VALVE AV Area (Vmax):    2.01 cm     PV Vmax:       0.90 m/s AV Area (Vmean):   2.20 cm     PV Vmean:      64.000 cm/s AV Area (VTI):     2.47 cm     PV VTI:        0.164 m AV Vmax:           151.00 cm/s  PV Peak grad:  3.2 mmHg AV Vmean:          105.000 cm/s PV Mean grad:  2.0 mmHg AV VTI:            0.273 m AV Peak Grad:      9.1 mmHg AV Mean Grad:      5.0 mmHg LVOT Vmax:         119.00 cm/s LVOT Vmean:        90.900 cm/s LVOT VTI:          0.265 m LVOT/AV VTI ratio: 0.97  AORTA Ao Root diam: 2.30 cm MITRAL VALVE MV Area (PHT): 3.01 cm     SHUNTS MV Area VTI:   2.69 cm     Systemic VTI:  0.26 m MV Peak grad:  4.0 mmHg     Systemic Diam: 1.80 cm MV Mean grad:  2.0 mmHg MV Vmax:       1.00 m/s MV Vmean:      61.8 cm/s MV Decel Time: 252 msec MV E velocity: 75.00 cm/s MV A velocity: 101.00 cm/s MV E/A ratio:  0.74 Ida Rogue MD Electronically signed by Ida Rogue MD Signature Date/Time: 06/13/2021/5:34:59 PM    Final       ASSESSMENT & PLAN:  1. Malignant neoplasm of left breast in female, estrogen receptor positive, unspecified site of breast ([place])   2. Chemotherapy induced nausea and vomiting   3. Thrombocytopenia ([place])   Cancer Staging Invasive carcinoma of breast ([place]) Staging form: Breast, AJCC 8th Edition - Clinical stage from 05/16/2021: Stage IIB (cT3, cN0, cM0, G3, ER+, PR+, HER2-) - Signed by [name], MD on 06/03/2021  #Left breast invasive carcinoma, multiple sites.  ER 90%, PR 11-50% positive, HER2 negative by IHC cT3 N0 On neoadjuvant chemotherapy Labs are reviewed and discussed with  patient. Hold treatment due to thrombocytopenia.   # chemotherapy induced thrombocytopenia. Hold treatment.  # Nausea, controlled by antiemetics.   Follow up in 1 week for cycle 2 ddAC.   All questions were answered. The patient knows to call the clinic with any problems questions or concerns.  cc McLean-Scocuzza, Olam Idler, MD, PhD Hematology Oncology [facility] Pager- [phone] 07/05/2021

## 2021-07-12 ENCOUNTER — Inpatient Hospital Stay: Payer: PPO

## 2021-07-12 ENCOUNTER — Inpatient Hospital Stay (HOSPITAL_BASED_OUTPATIENT_CLINIC_OR_DEPARTMENT_OTHER): Payer: PPO | Admitting: Oncology

## 2021-07-12 VITALS — BP 137/64 | HR 85 | Temp 99.6°F | Wt 144.6 lb

## 2021-07-12 DIAGNOSIS — C50912 Malignant neoplasm of unspecified site of left female breast: Secondary | ICD-10-CM

## 2021-07-12 DIAGNOSIS — R112 Nausea with vomiting, unspecified: Secondary | ICD-10-CM

## 2021-07-12 DIAGNOSIS — Z170 Estrogen receptor positive status [ER+]: Secondary | ICD-10-CM

## 2021-07-12 DIAGNOSIS — T451X5A Adverse effect of antineoplastic and immunosuppressive drugs, initial encounter: Secondary | ICD-10-CM

## 2021-07-12 DIAGNOSIS — Z5111 Encounter for antineoplastic chemotherapy: Secondary | ICD-10-CM | POA: Diagnosis not present

## 2021-07-12 DIAGNOSIS — D696 Thrombocytopenia, unspecified: Secondary | ICD-10-CM

## 2021-07-12 DIAGNOSIS — Z95828 Presence of other vascular implants and grafts: Secondary | ICD-10-CM

## 2021-07-12 LAB — CBC WITH DIFFERENTIAL/PLATELET
Abs Immature Granulocytes: 0.52 10*3/uL — ABNORMAL HIGH (ref 0.00–0.07)
Basophils Absolute: 0.1 10*3/uL (ref 0.0–0.1)
Basophils Relative: 1 %
Eosinophils Absolute: 0.1 10*3/uL (ref 0.0–0.5)
Eosinophils Relative: 0 %
HCT: 35.7 % — ABNORMAL LOW (ref 36.0–46.0)
Hemoglobin: 11.6 g/dL — ABNORMAL LOW (ref 12.0–15.0)
Immature Granulocytes: 4 %
Lymphocytes Relative: 20 %
Lymphs Abs: 2.4 10*3/uL (ref 0.7–4.0)
MCH: 30.4 pg (ref 26.0–34.0)
MCHC: 32.5 g/dL (ref 30.0–36.0)
MCV: 93.7 fL (ref 80.0–100.0)
Monocytes Absolute: 1.2 10*3/uL — ABNORMAL HIGH (ref 0.1–1.0)
Monocytes Relative: 10 %
Neutro Abs: 8.2 10*3/uL — ABNORMAL HIGH (ref 1.7–7.7)
Neutrophils Relative %: 65 %
Platelets: 269 10*3/uL (ref 150–400)
RBC: 3.81 MIL/uL — ABNORMAL LOW (ref 3.87–5.11)
RDW: 14.1 % (ref 11.5–15.5)
WBC: 12.5 10*3/uL — ABNORMAL HIGH (ref 4.0–10.5)
nRBC: 0.9 % — ABNORMAL HIGH (ref 0.0–0.2)

## 2021-07-12 LAB — COMPREHENSIVE METABOLIC PANEL
ALT: 21 U/L (ref 0–44)
AST: 16 U/L (ref 15–41)
Albumin: 4 g/dL (ref 3.5–5.0)
Alkaline Phosphatase: 70 U/L (ref 38–126)
Anion gap: 8 (ref 5–15)
BUN: 20 mg/dL (ref 8–23)
CO2: 22 mmol/L (ref 22–32)
Calcium: 9 mg/dL (ref 8.9–10.3)
Chloride: 109 mmol/L (ref 98–111)
Creatinine, Ser: 0.97 mg/dL (ref 0.44–1.00)
GFR, Estimated: >60 mL/min (ref >60)
Glucose, Bld: 106 mg/dL — ABNORMAL HIGH (ref 70–99)
Potassium: 3.8 mmol/L (ref 3.5–5.1)
Sodium: 139 mmol/L (ref 135–145)
Total Bilirubin: 0.5 mg/dL (ref 0.3–1.2)
Total Protein: 7.2 g/dL (ref 6.5–8.1)

## 2021-07-12 MED ORDER — DOXORUBICIN HCL CHEMO IV INJECTION 2 MG/ML
60.0000 mg/m2 | Freq: Once | INTRAVENOUS | Status: AC
  Administered 2021-07-12: 100 mg via INTRAVENOUS
  Filled 2021-07-12: qty 50

## 2021-07-12 MED ORDER — SODIUM CHLORIDE 0.9 % IV SOLN
150.0000 mg | Freq: Once | INTRAVENOUS | Status: AC
  Administered 2021-07-12: 150 mg via INTRAVENOUS
  Filled 2021-07-12: qty 150

## 2021-07-12 MED ORDER — SODIUM CHLORIDE 0.9 % IV SOLN
Freq: Once | INTRAVENOUS | Status: AC
Start: 2021-07-12 — End: 2021-07-12
  Filled 2021-07-12: qty 250

## 2021-07-12 MED ORDER — SODIUM CHLORIDE 0.9 % IV SOLN
600.0000 mg/m2 | Freq: Once | INTRAVENOUS | Status: AC
  Administered 2021-07-12: 1000 mg via INTRAVENOUS
  Filled 2021-07-12: qty 50

## 2021-07-12 MED ORDER — HEPARIN SOD (PORK) LOCK FLUSH 100 UNIT/ML IV SOLN
INTRAVENOUS | Status: AC
  Filled 2021-07-12: qty 5

## 2021-07-12 MED ORDER — PEGFILGRASTIM 6 MG/0.6ML ~~LOC~~ PSKT
6.0000 mg | PREFILLED_SYRINGE | Freq: Once | SUBCUTANEOUS | Status: AC
  Administered 2021-07-12: 6 mg via SUBCUTANEOUS
  Filled 2021-07-12: qty 0.6

## 2021-07-12 MED ORDER — SODIUM CHLORIDE 0.9 % IV SOLN
10.0000 mg | Freq: Once | INTRAVENOUS | Status: AC
  Administered 2021-07-12: 10 mg via INTRAVENOUS
  Filled 2021-07-12: qty 10

## 2021-07-12 MED ORDER — PALONOSETRON HCL INJECTION 0.25 MG/5ML
0.2500 mg | Freq: Once | INTRAVENOUS | Status: AC
  Administered 2021-07-12: 0.25 mg via INTRAVENOUS
  Filled 2021-07-12: qty 5

## 2021-07-12 MED ORDER — HEPARIN SOD (PORK) LOCK FLUSH 100 UNIT/ML IV SOLN
500.0000 [IU] | Freq: Once | INTRAVENOUS | Status: AC
  Administered 2021-07-12: 500 [IU] via INTRAVENOUS
  Filled 2021-07-12: qty 5

## 2021-07-12 NOTE — Patient Instructions (Signed)
[facility]  Discharge Instructions: Thank you for choosing [facility] to provide your oncology and hematology care.  If you have a lab appointment with the [facility], please go directly to the [facility] and check in at the registration area.  Wear comfortable clothing and clothing appropriate for easy access to any Portacath or PICC line.   We strive to give you quality time with your provider. You may need to reschedule your appointment if you arrive late (15 or more minutes).  Arriving late affects you and other patients whose appointments are after yours.  Also, if you miss three or more appointments without notifying the office, you may be dismissed from the clinic at the provider's discretion.      For prescription refill requests, have your pharmacy contact our office and allow 72 hours for refills to be completed.    Today you received the following chemotherapy and/or immunotherapy agents Adriamycin and cytoxan      To help prevent nausea and vomiting after your treatment, we encourage you to take your nausea medication as directed.  BELOW ARE SYMPTOMS THAT SHOULD BE REPORTED IMMEDIATELY: *FEVER GREATER THAN 100.4 F (38 C) OR HIGHER *CHILLS OR SWEATING *NAUSEA AND VOMITING THAT IS NOT CONTROLLED WITH YOUR NAUSEA MEDICATION *UNUSUAL SHORTNESS OF BREATH *UNUSUAL BRUISING OR BLEEDING *URINARY PROBLEMS (pain or burning when urinating, or frequent urination) *BOWEL PROBLEMS (unusual diarrhea, constipation, pain near the anus) TENDERNESS IN MOUTH AND THROAT WITH OR WITHOUT PRESENCE OF ULCERS (sore throat, sores in mouth, or a toothache) UNUSUAL RASH, SWELLING OR PAIN  UNUSUAL VAGINAL DISCHARGE OR ITCHING   Items with * indicate a potential emergency and should be followed up as soon as possible or go to the Emergency Department if any problems should occur.  Please show the CHEMOTHERAPY ALERT CARD or IMMUNOTHERAPY ALERT CARD  at check-in to the Emergency Department and triage nurse.  Should you have questions after your visit or need to cancel or reschedule your appointment, please contact [facility]  [phone] and follow the prompts.  Office hours are 8:00 a.m. to 4:30 p.m. Monday - Friday. Please note that voicemails left after 4:00 p.m. may not be returned until the following business day.  We are closed weekends and major holidays. You have access to a nurse at all times for urgent questions. Please call the main number to the clinic [phone] and follow the prompts.  For any non-urgent questions, you may also contact your provider using MyChart. We now offer e-Visits for anyone 73 and older to request care online for non-urgent symptoms. For details visit [email or web address].   Also download the MyChart app! Go to the app store, search "MyChart", open the app, select , and log in with your MyChart username and password.  Due to Covid, a mask is required upon entering the hospital/clinic. If you do not have a mask, one will be given to you upon arrival. For doctor visits, patients may have 1 support person aged 43 or older with them. For treatment visits, patients cannot have anyone with them due to current Covid guidelines and our immunocompromised population.   Doxorubicin injection What is this medication? DOXORUBICIN (dox oh ROO bi sin) is a chemotherapy drug. It is used to treat many kinds of cancer like leukemia, lymphoma, neuroblastoma, sarcoma, and Wilms' tumor. It is also used to treat bladder cancer, breast cancer, lungcancer, ovarian cancer, stomach cancer, and thyroid cancer. This medicine  may be used for other purposes; ask your health care provider orpharmacist if you have questions. COMMON BRAND NAME(S): Adriamycin, Adriamycin PFS, Adriamycin RDF, Rubex What should I tell my care team before I take this medication? They need to know if you have  any of these conditions: heart disease history of low blood counts caused by a medicine liver disease recent or ongoing radiation therapy an unusual or allergic reaction to doxorubicin, other chemotherapy agents, other medicines, foods, dyes, or preservatives pregnant or trying to get pregnant breast-feeding How should I use this medication? This drug is given as an infusion into a vein. It is administered in a hospital or clinic by a specially trained health care professional. If you have pain, swelling, burning or any unusual feeling around the site of your injection,tell your health care professional right away. Talk to your pediatrician regarding the use of this medicine in children.Special care may be needed. Overdosage: If you think you have taken too much of this medicine contact apoison control center or emergency room at once. NOTE: This medicine is only for you. Do not share this medicine with others. What if I miss a dose? It is important not to miss your dose. Call your doctor or health careprofessional if you are unable to keep an appointment. What may interact with this medication? This medicine may interact with the following medications: 6-mercaptopurine paclitaxel phenytoin [facility]. John's Wort trastuzumab verapamil This list may not describe all possible interactions. Give your health care provider a list of all the medicines, herbs, non-prescription drugs, or dietary supplements you use. Also tell them if you smoke, drink alcohol, or use illegaldrugs. Some items may interact with your medicine. What should I watch for while using this medication? This drug may make you feel generally unwell. This is not uncommon, as chemotherapy can affect healthy cells as well as cancer cells. Report any side effects. Continue your course of treatment even though you feel ill unless yourdoctor tells you to stop. There is a maximum amount of this medicine you should receive throughout your  life. The amount depends on the medical condition being treated and your overall health. Your doctor will watch how much of this medicine you receive inyour lifetime. Tell your doctor if you have taken this medicine before. You may need blood work done while you are taking this medicine. Your urine may turn red for a few days after your dose. This is not blood. Ifyour urine is dark or brown, call your doctor. In some cases, you may be given additional medicines to help with side effects.Follow all directions for their use. Call your doctor or health care professional for advice if you get a fever, chills or sore throat, or other symptoms of a cold or flu. Do not treat yourself. This drug decreases your body's ability to fight infections. Try toavoid being around people who are sick. This medicine may increase your risk to bruise or bleed. Call your doctor orhealth care professional if you notice any unusual bleeding. Talk to your doctor about your risk of cancer. You may be more at risk forcertain types of cancers if you take this medicine. Do not become pregnant while taking this medicine or for 6 months after stopping it. Women should inform their doctor if they wish to become pregnant or think they might be pregnant. Men should not father a child while taking this medicine and for 6 months after stopping it. There is a potential for serious side effects to an  unborn child. Talk to your health care professional or pharmacist for more information. Do not breast-feed an infant while takingthis medicine. This medicine has caused ovarian failure in some women and reduced sperm counts in some men This medicine may interfere with the ability to have a child. Talk with your doctor or health care professional if you are concerned about yourfertility. This medicine may cause a decrease in Co-Enzyme Q-10. You should make sure that you get enough Co-Enzyme Q-10 while you are taking this medicine. Discuss thefoods  you eat and the vitamins you take with your health care professional. What side effects may I notice from receiving this medication? Side effects that you should report to your doctor or health care professionalas soon as possible: allergic reactions like skin rash, itching or hives, swelling of the face, lips, or tongue breathing problems chest pain fast or irregular heartbeat low blood counts - this medicine may decrease the number of white blood cells, red blood cells and platelets. You may be at increased risk for infections and bleeding. pain, redness, or irritation at site where injected signs of infection - fever or chills, cough, sore throat, pain or difficulty passing urine signs of decreased platelets or bleeding - bruising, pinpoint red spots on the skin, black, tarry stools, blood in the urine swelling of the ankles, feet, hands tiredness weakness Side effects that usually do not require medical attention (report to yourdoctor or health care professional if they continue or are bothersome): diarrhea hair loss mouth sores nail discoloration or damage nausea red colored urine vomiting This list may not describe all possible side effects. Call your doctor for medical advice about side effects. You may report side effects to FDA at1-800-FDA-1088. Where should I keep my medication? This drug is given in a hospital or clinic and will not be stored at home. NOTE: This sheet is a summary. It may not cover all possible information. If you have questions about this medicine, talk to your doctor, pharmacist, orhealth care provider.  2022 Elsevier/Gold Standard (2017-06-24 11:01:26)  Cyclophosphamide Injection What is this medication? CYCLOPHOSPHAMIDE (sye kloe FOSS fa mide) is a chemotherapy drug. It slows the growth of cancer cells. This medicine is used to treat many types of cancer like lymphoma, myeloma, leukemia, breast cancer, and ovarian cancer, to name afew. This medicine may  be used for other purposes; ask your health care provider orpharmacist if you have questions. COMMON BRAND NAME(S): Cytoxan, Neosar What should I tell my care team before I take this medication? They need to know if you have any of these conditions: heart disease history of irregular heartbeat infection kidney disease liver disease low blood counts, like white cells, platelets, or red blood cells on hemodialysis recent or ongoing radiation therapy scarring or thickening of the lungs trouble passing urine an unusual or allergic reaction to cyclophosphamide, other medicines, foods, dyes, or preservatives pregnant or trying to get pregnant breast-feeding How should I use this medication? This drug is usually given as an injection into a vein or muscle or by infusion into a vein. It is administered in a hospital or clinic by a specially trainedhealth care professional. Talk to your pediatrician regarding the use of this medicine in children.Special care may be needed. Overdosage: If you think you have taken too much of this medicine contact apoison control center or emergency room at once. NOTE: This medicine is only for you. Do not share this medicine with others. What if I miss a dose? It is important  not to miss your dose. Call your doctor or health careprofessional if you are unable to keep an appointment. What may interact with this medication? amphotericin B azathioprine certain antivirals for HIV or hepatitis certain medicines for blood pressure, heart disease, irregular heart beat certain medicines that treat or prevent blood clots like warfarin certain other medicines for cancer cyclosporine etanercept indomethacin medicines that relax muscles for surgery medicines to increase blood counts metronidazole This list may not describe all possible interactions. Give your health care provider a list of all the medicines, herbs, non-prescription drugs, or dietary supplements you  use. Also tell them if you smoke, drink alcohol, or use illegaldrugs. Some items may interact with your medicine. What should I watch for while using this medication? Your condition will be monitored carefully while you are receiving thismedicine. You may need blood work done while you are taking this medicine. Drink water or other fluids as directed. Urinate often, even at night. Some products may contain alcohol. Ask your health care professional if this medicine contains alcohol. Be sure to tell all health care professionals you are taking this medicine. Certain medicines, like metronidazole and disulfiram, can cause an unpleasant reaction when taken with alcohol. The reaction includes flushing, headache, nausea, vomiting, sweating, and increased thirst. Thereaction can last from 30 minutes to several hours. Do not become pregnant while taking this medicine or for 1 year after stopping it. Women should inform their health care professional if they wish to become pregnant or think they might be pregnant. Men should not father a child while taking this medicine and for 4 months after stopping it. There is potential for serious side effects to an unborn child. Talk to your health care professionalfor more information. Do not breast-feed an infant while taking this medicine or for 1 week afterstopping it. This medicine has caused ovarian failure in some women. This medicine may make it more difficult to get pregnant. Talk to your health care professional if [place] concerned about your fertility. This medicine has caused decreased sperm counts in some men. This may make it more difficult to father a child. Talk to your health care professional if [place] concerned about your fertility. Call your health care professional for advice if you get a fever, chills, or sore throat, or other symptoms of a cold or flu. Do not treat yourself. This medicine decreases your body's ability to fight infections. Try to avoid  beingaround people who are sick. Avoid taking medicines that contain aspirin, acetaminophen, ibuprofen, naproxen, or ketoprofen unless instructed by your health care professional.These medicines may hide a fever. Talk to your health care professional about your risk of cancer. You may bemore at risk for certain types of cancer if you take this medicine. If you are going to need surgery or other procedure, tell your health careprofessional that you are using this medicine. Be careful brushing or flossing your teeth or using a toothpick because you may get an infection or bleed more easily. If you have any dental work done, Primary school teacher you are receiving this medicine. What side effects may I notice from receiving this medication? Side effects that you should report to your doctor or health care professionalas soon as possible: allergic reactions like skin rash, itching or hives, swelling of the face, lips, or tongue breathing problems nausea, vomiting signs and symptoms of bleeding such as bloody or black, tarry stools; red or dark brown urine; spitting up blood or brown material that looks like coffee grounds; red spots  on the skin; unusual bruising or bleeding from the eyes, gums, or nose signs and symptoms of heart failure like fast, irregular heartbeat, sudden weight gain; swelling of the ankles, feet, hands signs and symptoms of infection like fever; chills; cough; sore throat; pain or trouble passing urine signs and symptoms of kidney injury like trouble passing urine or change in the amount of urine signs and symptoms of liver injury like dark yellow or brown urine; general ill feeling or flu-like symptoms; light-colored stools; loss of appetite; nausea; right upper belly pain; unusually weak or tired; yellowing of the eyes or skin Side effects that usually do not require medical attention (report to yourdoctor or health care professional if they continue or are  bothersome): confusion decreased hearing diarrhea facial flushing hair loss headache loss of appetite missed menstrual periods signs and symptoms of low red blood cells or anemia such as unusually weak or tired; feeling faint or lightheaded; falls skin discoloration This list may not describe all possible side effects. Call your doctor for medical advice about side effects. You may report side effects to FDA at1-800-FDA-1088. Where should I keep my medication? This drug is given in a hospital or clinic and will not be stored at home. NOTE: This sheet is a summary. It may not cover all possible information. If you have questions about this medicine, talk to your doctor, pharmacist, orhealth care provider.  2022 Elsevier/Gold Standard (2019-08-15 09:53:29)

## 2021-07-13 ENCOUNTER — Encounter: Payer: Self-pay | Admitting: Oncology

## 2021-07-13 NOTE — Progress Notes (Signed)
Hematology/Oncology Progress note [facility] Telephone:([phone] [phone]   Patient Care Team: McLean-Scocuzza, Nino Glow, MD as PCP - General (Internal Medicine) De Hollingshead, [place] as Pharmacist (Pharmacist) Theodore Demark, RN as Oncology Nurse Navigator  REFERRING PROVIDER: McLean-Scocuzza, Olivia Mackie *  CHIEF COMPLAINTS/REASON FOR VISIT:  multifocal left breast cancer  HISTORY OF PRESENTING ILLNESS:   Tammy Nunez is a  70 y.o.  female with [place] listed below was seen in consultation at the request of  McLean-Scocuzza, Olivia Mackie *  for evaluation of multifocal  04/24/2021, screening mammogram showed large asymmetry in the superior aspect of the breast with associated calcifications and possible masses and a subtle distortions.  No suspicious findings for malignancy right breast.  05/02/2021 left breast diagnostic mammogram and ultrasound showed multiple irregular mass in the left breast. Mammogram mass 1 irregular mass associated with distortion in the upper breast at middle depth, multiple adjacent and possible continuous irregular masses, conglomeration of masses measures at least 5.6 cm.  Associated pleomorphic calcifications extending at least 5 x 3.9 x 3.7 cm Mass 2, 1 cm mass in the upper breast at the middle to posterior depth. Mass 3, 5 mm irregular mass in the upper breast at posterior depth. In total, mass 1-mass 3 span at least 8.2 cm in anterior-posterior extent.  By ultrasound  mass 1 12:00 5 cm from nipple, 2.7 x 1.5 x 4.3 cm Mass 2, 12:00 12 cm from nipple, 0.9 x 0.9 x 0.5 cm Mass 3   12:00 14 cm from nipple, 0.4 x 0.4 x 0.4 cm-uses 2.4 cm superior to mass to Mass 4   11:00  No suspicious left axillary adenopathy.   05/07/2021 -Ultrasound-guided breast biopsy Breast left 12:00 mass 5 cm from nipple biopsy showed invasive mammary carcinoma, no special type, grade 3, high-grade DCIS present, no LVI, ER 90%, PR 11-50%, HER2  negative by IHC Breast left 11:00 mass 10 cm from nipple biopsy showed invasive mammary carcinoma, no special type, grade 2, no DCIS/LVI identified.ER 90%, PR 11-50%, HER2 negative by IHC  Patient is married has no children.  Denies any hormone replacement, oral contraceptive use, family history of breast cancer.  Denies any chest radiation. Patient has a history of stroke with chronic residual weakness of the left side.  She walks with a cane.  05/22/2021 MRI breast showed that nonmass like malignancy spanning over 8 cm, abuting anterior aspect of pectoralis muscle. Discussed with Dr.Cintron and we agree that she will need mastectomy. There is concern of possible positive margin. I recommend neoadjuvant chemotherapy  # medi port placed on 06/06/2021. 06/13/2021 pre chemotherapy Echo. LVEF 60-65%.  Normal global longitudinal strain. Grade 1 diastolic dysfunction.   # 05/22/2021 MRI breast showed  Biopsy-proven malignancy within the UPPER LEFT breast spanning a distance of 8.3 x 4.2 x 4 cm nonmass like enhancement abuts the anterior aspect of the LEFT pectoralis muscle without gross invasion. Oncotype Dx 27. Discussed with Dr.Cintron, there is concern of positive margin.   INTERVAL HISTORY Tammy Nunez is a 70 y.o. female who has above history reviewed by me today presents for follow up visit for chemotherapy of breast cancer She feels well no nausea vomiting, fever or chills. Accompanied by husband.     Review of Systems  Constitutional:  Negative for appetite change, chills, fatigue and fever.  HENT:   Negative for hearing loss and voice change.   Eyes:  Negative for eye problems.  Respiratory:  Negative for chest tightness and  cough.   Cardiovascular:  Negative for chest pain.  Gastrointestinal:  Negative for abdominal distention, abdominal pain, blood in stool and nausea.  Endocrine: Negative for hot flashes.  Genitourinary:  Negative for difficulty urinating and frequency.    Musculoskeletal:  Negative for arthralgias.  Skin:  Negative for itching and rash.  Neurological:  Negative for extremity weakness.  Hematological:  Negative for adenopathy. Does not bruise/bleed easily.  Psychiatric/Behavioral:  Negative for confusion.    MEDICAL HISTORY:  Past Medical History:  Diagnosis Date   Breast cancer in female [facility]) 05/08/2021   Chronic left shoulder pain    Coronary artery disease    Diabetes mellitus without complication (HCC)    Dysrhythmia    Hypertension    Paralysis (HCC)    weakness left upper amd lower extremity   PONV (postoperative nausea and vomiting)    Stroke [facility])     SURGICAL HISTORY: Past Surgical History:  Procedure Laterality Date   BREAST BIOPSY Left 05/07/2021   12:00 5cmfn vision clip path pending   BREAST BIOPSY Left 05/07/2021   11:00 10 cmfn mini cork clip path pending   CAROTID PTA/STENT INTERVENTION Left 07/20/2019   Procedure: CAROTID PTA/STENT INTERVENTION;  Surgeon: Katha Cabal, MD;  Location: [facility];  Service: Cardiovascular;  Laterality: Left;   ECTOPIC PREGNANCY SURGERY     PORTACATH PLACEMENT N/A 06/06/2021   Procedure: INSERTION PORT-A-CATH;  Surgeon: Herbert Pun, MD;  Location: [facility] ORS;  Service: General;  Laterality: N/A;    SOCIAL HISTORY: Social History   Socioeconomic History   Marital status: Married    Spouse name: Not on file   Number of children: Not on file   Years of education: Not on file   Highest education level: Not on file  Occupational History   Not on file  Tobacco Use   Smoking status: Former    Packs/day: 0.50    Years: 15.00    Pack years: 7.50    Types: Cigarettes    Quit date: 04/11/2019    Years since quitting: 2.2   Smokeless tobacco: Never  Vaping Use   Vaping Use: Never used  Substance and Sexual Activity   Alcohol use: Never   Drug use: Never   Sexual activity: Not Currently  Other Topics Concern   Not on file  Social History Narrative    No kids    Married with husband    Used to work [employer] broadcast assistant    Social Determinants of Radio broadcast assistant Strain: Low Risk    Difficulty of Paying Living Expenses: Not hard at all  Food Insecurity: Not on file  Transportation Needs: Not on file  Physical Activity: Sufficiently Active   Days of Exercise per Week: 7 days   Minutes of Exercise per Session: 30 min  Stress: Not on file  Social Connections: Not on file  Intimate Partner Violence: Not on file    FAMILY HISTORY: Family History  Problem Relation Age of Onset   Diabetes type II Mother    Hypertension Father    Heart attack Father    Diabetes type II Brother    Breast cancer Neg Hx     ALLERGIES:  has No Known Allergies.  MEDICATIONS:  Current Outpatient Medications  Medication Sig Dispense Refill   amLODipine (NORVASC) 10 MG tablet Take 1 tablet by mouth once daily 90 tablet 0   aspirin EC 81 MG EC tablet Take 1 tablet (81 mg total)  by mouth daily.     atorvastatin (LIPITOR) 80 MG tablet Take 1 tablet (80 mg total) by mouth daily. 90 tablet 3   blood glucose meter kit and supplies 1 each by Other route 4 (four) times daily. Dispense based on patient and insurance preference. Four times daily as directed. (FOR ICD-10 E10.9, E11.9). 1 each 0   carvedilol (COREG) 25 MG tablet Take 1 tablet (25 mg total) by mouth 2 (two) times daily with a meal. 180 tablet 3   clopidogrel (PLAVIX) 75 MG tablet Take 1 tablet (75 mg total) by mouth daily. Further refills [facility] neurology 90 tablet 3   Continuous Blood Gluc Sensor (FREESTYLE LIBRE 2 SENSOR) MISC Use to check glucose at least every 8 hours 6 each 3   dexamethasone (DECADRON) 4 MG tablet Take 2 tablets (8 mg total) by mouth daily. Take daily for 3 days after chemo. Take with food. 30 tablet 1   empagliflozin (JARDIANCE) 25 MG TABS tablet Take 1 tablet (25 mg total) by mouth daily. 90 tablet 3   lidocaine-prilocaine (EMLA) cream Apply to affected  area once 30 g 3   lisinopril (ZESTRIL) 40 MG tablet Take 1 tablet (40 mg total) by mouth daily. 90 tablet 3   loratadine (CLARITIN) 10 MG tablet Take 10 mg by mouth daily.     metFORMIN (GLUCOPHAGE XR) 500 MG 24 hr tablet Take 2 tablets (1,000 mg total) by mouth daily with breakfast. (Patient taking differently: Take 500 mg by mouth 2 (two) times daily.) 180 tablet 3   ondansetron (ZOFRAN) 8 MG tablet Take 1 tablet (8 mg total) by mouth 2 (two) times daily as needed. Start on the third day after chemotherapy. 30 tablet 1   potassium chloride SA (KLOR-CON M20) 20 MEQ tablet Take 1 tablet (20 mEq total) by mouth daily. 90 tablet 3   prochlorperazine (COMPAZINE) 10 MG tablet Take 1 tablet (10 mg total) by mouth every 6 (six) hours as needed (Nausea or vomiting). 30 tablet 1   vitamin B-12 (CYANOCOBALAMIN) 1000 MCG tablet Take 1 tablet (1,000 mcg total) by mouth daily. 30 tablet 1   Vitamin D, Cholecalciferol, 50 MCG (2000 UT) CAPS Take 2,000 Units by mouth.     No current facility-administered medications for this visit.   Facility-Administered Medications Ordered in Other Visits  Medication Dose Route Frequency Provider Last Rate Last Admin   heparin lock flush 100 UNIT/ML injection              PHYSICAL EXAMINATION: ECOG PERFORMANCE STATUS: 1 - Symptomatic but completely ambulatory Vitals:   07/12/21 1045  BP: 137/64  Pulse: 85  Temp: 99.6 F (37.6 C)   Filed Weights   07/12/21 1045  Weight: 144 lb 9.6 oz (65.6 kg)    Physical Exam Constitutional:      General: She is not in acute distress. HENT:     Head: Normocephalic and atraumatic.  Eyes:     General: No scleral icterus. Cardiovascular:     Rate and Rhythm: Normal rate and regular rhythm.     Heart sounds: Normal heart sounds.  Pulmonary:     Effort: Pulmonary effort is normal. No respiratory distress.     Breath sounds: No wheezing.  Abdominal:     General: Bowel sounds are normal. There is no distension.      Palpations: Abdomen is soft.  Musculoskeletal:        General: No deformity. Normal range of motion.     Cervical back:  Normal range of motion and neck supple.  Skin:    General: Skin is warm and dry.     Findings: No erythema or rash.  Neurological:     Mental Status: She is alert and oriented to person, place, and time. Mental status is at baseline.     Comments: Chronic left upper and lower extremity weakness.  Psychiatric:        Mood and Affect: Mood normal.        LABORATORY DATA:  I have reviewed the data as listed Lab Results  Component Value Date   WBC 12.5 (H) 07/12/2021   HGB 11.6 (L) 07/12/2021   HCT 35.7 (L) 07/12/2021   MCV 93.7 07/12/2021   PLT 269 07/12/2021   Recent Labs    06/28/21 0813 07/05/21 0814 07/12/21 1025  NA 137 140 139  K 3.8 3.8 3.8  CL 108 108 109  CO2 19* 23 22  GLUCOSE 122* 170* 106*  BUN 26* 17 20  CREATININE 0.93 0.92 0.97  CALCIUM 8.9 10.0 9.0  GFRNONAA >60 >60 >60  PROT 6.7 6.6 7.2  ALBUMIN 3.9 3.9 4.0  AST 13* 18 16  ALT _0 ALKPHOS 54 78 70  BILITOT 0.5 0.5 0.5    Iron/TIBC/Ferritin/ %Sat    Component Value Date/Time   IRON 113 12/08/2019 1015   TIBC 317 12/08/2019 1015   FERRITIN 67 12/08/2019 1015   IRONPCTSAT 36 12/08/2019 1015       RADIOGRAPHIC STUDIES: I have personally reviewed the radiological images as listed and agreed with the findings in the report. MR BREAST BILATERAL W WO CONTRAST INC CAD  Result Date: 05/22/2021 CLINICAL DATA:  70 year old female for staging of newly diagnosed LEFT breast cancer. LABS:  None performed today EXAM: BILATERAL BREAST MRI WITH AND WITHOUT CONTRAST TECHNIQUE: Multiplanar, multisequence MR images of both breasts were obtained prior to and following the intravenous administration of 6 ml of Gadavist Three-dimensional MR images were rendered by post-processing of the original MR data on an independent workstation. The three-dimensional MR images were interpreted, and  findings are reported in the following complete MRI report for this study. Three dimensional images were evaluated at the independent interpreting workstation using the DynaCAD thin client. COMPARISON:  Previous exam(s). FINDINGS: Breast composition: c. Heterogeneous fibroglandular tissue. Background parenchymal enhancement: Minimal Right breast: No mass or abnormal enhancement. Left breast: A 8.3 x 4.2 x 4 cm (AP x transverse x CC) area of non masslike enhancement and scattered enhancing masses is identified within the UPPER LEFT breast, from middle to posterior depth, compatible with biopsy-proven malignancy. Artifacts from biopsy clips within the mid and anterior aspects of this abnormal enhancement are noted. Non masslike enhancement extends to the LEFT pectoralis muscle without gross muscle invasion. Lymph nodes: No abnormally enlarged or thickened lymph nodes are noted. Ancillary findings:  None. IMPRESSION: 1. Biopsy-proven malignancy within the UPPER LEFT breast spanning a distance of 8.3 x 4.2 x 4 cm. Nonmasslike enhancement abuts the anterior aspect of the LEFT pectoralis muscle without gross invasion. 2. No MR evidence of RIGHT breast malignancy. No abnormal appearing lymph nodes. RECOMMENDATION: Treatment plan BI-RADS CATEGORY  6: Known biopsy-proven malignancy. Electronically Signed   By: Margarette Canada M.D.   On: 05/22/2021 11:20  DG Bone Density  Result Date: 04/24/2021 EXAM: DUAL X-RAY ABSORPTIOMETRY (DXA) FOR BONE MINERAL DENSITY IMPRESSION: crr Your patient Trameka Dorough completed a BMD test on 04/24/2021 using the [place] (analysis version: 14.10)  manufactured by [employer]. The following summarizes the results of our evaluation. PATIENT BIOGRAPHICAL: Name: Christena, Sunderlin Patient ID: [id number] Birth Date: Oct 30, 1951 Height: 61.0 in. Gender: Female Exam Date: 04/24/2021 Weight: 163.4 lbs. Indications: Advanced Age, diabetic, hx stroke, POSTmenopausal Fractures:  Treatments: 81 MG ASPIRIN, jardiance, metformin, plavix, Vitamin D ASSESSMENT: The BMD measured at Femur Neck Left is 0.926 g/cm2 with a T-score of -0.8. This patient is considered normal according to [facility]) criteria. Lumbar spine was not utilized due to advanced degenerative changes.The scan quality is limited by exclusionof L-spine. Site Region Measured Measured WHO Young Adult BMD Date       Age      Classification T-score DualFemur Neck Left 04/24/2021 70.2 Normal -0.8 0.926 g/cm2 Right Forearm Radius 33% 04/24/2021 70.2 Normal 0.4 0.911 g/cm2 World Health Organization [facility]) criteria for post-menopausal, Caucasian Women: Normal:       T-score at or above -1 SD Osteopenia:   T-score between -1 and -2.5 SD Osteoporosis: T-score at or below -2.5 SD RECOMMENDATIONS: 1. All patients should optimize calcium and vitamin D intake. 2. Consider FDA-approved medical therapies in postmenopausal women and men aged 48 years and older, based on the following: a. A hip or vertebral (clinical or morphometric) fracture b. T-score < -2.5 at the femoral neck or spine after appropriate evaluation to exclude secondary causes c. Low bone mass (T-score between -1.0 and -2.5 at the femoral neck or spine) and a 10-year probability of a hip fracture > 3% or a 10-year probability of a major osteoporosis-related fracture > 20% based on the [place]-adapted WHO algorithm d. Clinician judgment and/or patient preferences may indicate treatment for people with 10-year fracture probabilities above or below these levels FOLLOW-UP: People with diagnosed cases of osteoporosis or at high risk for fracture should have regular bone mineral density tests. For patients eligible for Medicare, routine testing is allowed once every 2 years. The testing frequency can be increased to one year for patients who have rapidly progressing disease, those who are receiving or discontinuing medical therapy to restore bone mass, or have additional  risk factors. I have reviewed this report, and agree with the above findings. [facility] Radiology Electronically Signed   By: Lowella Grip III M.D.   On: 04/24/2021 11:36   DG Chest Port 1 View  Result Date: 06/06/2021 CLINICAL DATA:  Post Port-A-Cath placement EXAM: PORTABLE CHEST 1 VIEW COMPARISON:  Portable exam 1234 hours without priors for comparison FINDINGS: RIGHT jugular Port-A-Cath with tip projecting over SVC. Normal heart size, mediastinal contours, and pulmonary vascularity. Atherosclerotic calcification aorta. Lungs clear. No acute infiltrate, pleural effusion, or pneumothorax. Osseous structures unremarkable. IMPRESSION: No pneumothorax following RIGHT jugular Port-A-Cath insertion. Aortic Atherosclerosis (ICD10-I70.0). Electronically Signed   By: Lavonia Dana M.D.   On: 06/06/2021 13:24   DG C-Arm 1-60 Min-No Report  Result Date: 06/06/2021 Fluoroscopy was utilized by the requesting physician.  No radiographic interpretation.   US BREAST LTD UNI LEFT INC AXILLA  Result Date: 05/02/2021 CLINICAL DATA:  Callback for LEFT breast masses with calcifications and distortion EXAM: DIGITAL DIAGNOSTIC UNILATERAL LEFT MAMMOGRAM WITH TOMOSYNTHESIS AND CAD; ULTRASOUND LEFT BREAST LIMITED TECHNIQUE: Left digital diagnostic mammography and breast tomosynthesis was performed. The images were evaluated with computer-aided detection.; Targeted ultrasound examination of the left breast was performed COMPARISON:  Previous baseline exam. ACR Breast Density Category c: The breast tissue is heterogeneously dense, which may obscure small masses. FINDINGS: Spot compression tomosynthesis views confirm persistence of an irregular mass with associated  architectural distortion in the upper breast at middle depth (mass 1). There are multiple adjacent and possibly contiguous irregular masses. This conglomeration of masses measures at least 5.6 cm. Spot magnification views demonstrated associated segmental  pleomorphic calcifications. These extend at least 5.0 by 3.9 by 3.7 cm. There are several adjacent separate masses extending posteriorly from the dominant conglomeration of masses. Mass 2: There is a 1 cm mass in the upper breast at middle to posterior depth (ML slice 23). Mass 3: There is a 5 mm irregular mass in the upper breast at posterior depth (ML slice 23). In total, mass 1 through mass 3 span at least 8.2 cm in anterior-posterior extent. On physical exam, there is a hard mass in the upper breast. Targeted ultrasound was performed of the upper breast. There are multiple masses as follows. Mass 1: There is a conglomeration of masses at 12 o'clock 5 cm from the nipple. It is irregular with heterogeneous internal echotexture. Exact measurements are difficult to ascertain due to heterogeneous interconnected appearance of multiple irregular masses but is estimated to span at least 2.7 x 1.5 by 4.3 cm. Mass 2: At 12 o'clock 12 cm from the nipple, there is an irregular mass with irregular margins. It measures 9 by 9 x 5 mm and is distinct from the conglomeration of masses. This corresponds to mass 2 described on mammogram. Mass 3: At 12 o'clock 14 cm from the nipple, there is an irregular hypoechoic mass. It measures 4 x 4 by 4 mm. It is a distinct mass that is approximately 2.4 cm superior from mass 2. This corresponds to mass 3 described on mammogram. Mass 4: Adjacent to the interconnected conglomeration of masses is an additional additional mass at 12 o'clock 10 cm from the nipple which is irregular and hypoechoic in appearance. It measures 14 x 7 x 11 mm. It is unclear whether directly connected to the dominant mass. Targeted ultrasound was performed of the LEFT axilla. No suspicious axillary lymph nodes are seen. IMPRESSION: 1. There are multiple irregular masses in the LEFT upper breast which span approximately 8.2 cm in total which are highly concerning for malignancy. Dominant conglomeration of masses  measures at least 5.6 cm mammographically and demonstrates associated pleomorphic calcifications. Recommend ultrasound-guided biopsy of this dominant conglomeration of masses (12 o'clock 5 cm from the nipple; mass 1). 2. There are at least 2 adjacent distinctly separate masses which likely reflect satellite lesions. Recommend ultrasound-guided biopsy of 1 of these masses to demonstrate extent of disease. Mass 2 (12 o'clock 12 cm from the nipple) is likely the most amenable for ultrasound-guided biopsy. 3. No suspicious LEFT axillary adenopathy. RECOMMENDATION: LEFT breast ultrasound-guided biopsy x2 I have discussed the findings and recommendations with the patient. If applicable, a reminder letter will be sent to the patient regarding the next appointment. Patient will be scheduled for biopsy at her earliest convenience by the schedulers and ordering provider will be notified. BI-RADS CATEGORY  5: Highly suggestive of malignancy. Electronically Signed   By: Valentino Saxon MD   On: 05/02/2021 12:35  MM DIAG BREAST TOMO UNI LEFT  Result Date: 05/02/2021 CLINICAL DATA:  Callback for LEFT breast masses with calcifications and distortion EXAM: DIGITAL DIAGNOSTIC UNILATERAL LEFT MAMMOGRAM WITH TOMOSYNTHESIS AND CAD; ULTRASOUND LEFT BREAST LIMITED TECHNIQUE: Left digital diagnostic mammography and breast tomosynthesis was performed. The images were evaluated with computer-aided detection.; Targeted ultrasound examination of the left breast was performed COMPARISON:  Previous baseline exam. ACR Breast Density Category c: The breast  tissue is heterogeneously dense, which may obscure small masses. FINDINGS: Spot compression tomosynthesis views confirm persistence of an irregular mass with associated architectural distortion in the upper breast at middle depth (mass 1). There are multiple adjacent and possibly contiguous irregular masses. This conglomeration of masses measures at least 5.6 cm. Spot magnification views  demonstrated associated segmental pleomorphic calcifications. These extend at least 5.0 by 3.9 by 3.7 cm. There are several adjacent separate masses extending posteriorly from the dominant conglomeration of masses. Mass 2: There is a 1 cm mass in the upper breast at middle to posterior depth (ML slice 23). Mass 3: There is a 5 mm irregular mass in the upper breast at posterior depth (ML slice 23). In total, mass 1 through mass 3 span at least 8.2 cm in anterior-posterior extent. On physical exam, there is a hard mass in the upper breast. Targeted ultrasound was performed of the upper breast. There are multiple masses as follows. Mass 1: There is a conglomeration of masses at 12 o'clock 5 cm from the nipple. It is irregular with heterogeneous internal echotexture. Exact measurements are difficult to ascertain due to heterogeneous interconnected appearance of multiple irregular masses but is estimated to span at least 2.7 x 1.5 by 4.3 cm. Mass 2: At 12 o'clock 12 cm from the nipple, there is an irregular mass with irregular margins. It measures 9 by 9 x 5 mm and is distinct from the conglomeration of masses. This corresponds to mass 2 described on mammogram. Mass 3: At 12 o'clock 14 cm from the nipple, there is an irregular hypoechoic mass. It measures 4 x 4 by 4 mm. It is a distinct mass that is approximately 2.4 cm superior from mass 2. This corresponds to mass 3 described on mammogram. Mass 4: Adjacent to the interconnected conglomeration of masses is an additional additional mass at 12 o'clock 10 cm from the nipple which is irregular and hypoechoic in appearance. It measures 14 x 7 x 11 mm. It is unclear whether directly connected to the dominant mass. Targeted ultrasound was performed of the LEFT axilla. No suspicious axillary lymph nodes are seen. IMPRESSION: 1. There are multiple irregular masses in the LEFT upper breast which span approximately 8.2 cm in total which are highly concerning for malignancy.  Dominant conglomeration of masses measures at least 5.6 cm mammographically and demonstrates associated pleomorphic calcifications. Recommend ultrasound-guided biopsy of this dominant conglomeration of masses (12 o'clock 5 cm from the nipple; mass 1). 2. There are at least 2 adjacent distinctly separate masses which likely reflect satellite lesions. Recommend ultrasound-guided biopsy of 1 of these masses to demonstrate extent of disease. Mass 2 (12 o'clock 12 cm from the nipple) is likely the most amenable for ultrasound-guided biopsy. 3. No suspicious LEFT axillary adenopathy. RECOMMENDATION: LEFT breast ultrasound-guided biopsy x2 I have discussed the findings and recommendations with the patient. If applicable, a reminder letter will be sent to the patient regarding the next appointment. Patient will be scheduled for biopsy at her earliest convenience by the schedulers and ordering provider will be notified. BI-RADS CATEGORY  5: Highly suggestive of malignancy. Electronically Signed   By: Valentino Saxon MD   On: 05/02/2021 12:35  MM 3D SCREEN BREAST BILATERAL  Result Date: 04/25/2021 CLINICAL DATA:  Screening. EXAM: DIGITAL SCREENING BILATERAL MAMMOGRAM WITH TOMOSYNTHESIS AND CAD TECHNIQUE: Bilateral screening digital craniocaudal and mediolateral oblique mammograms were obtained. Bilateral screening digital breast tomosynthesis was performed. The images were evaluated with computer-aided detection. COMPARISON:  None.  Baseline.  ACR Breast Density Category c: The breast tissue is heterogeneously dense, which may obscure small masses. FINDINGS: In the left breast, a large asymmetry in the superior aspect of the breast with associated calcifications and possible masses and subtle distortion warrants further evaluation. In the right breast, no findings suspicious for malignancy. IMPRESSION: Further evaluation is suggested for possible asymmetry with associated calcifications and possible masses and subtle  distortion in the left breast. RECOMMENDATION: Diagnostic mammogram and possibly ultrasound of the left breast. (Code:[id number]) The patient will be contacted regarding the findings, and additional imaging will be scheduled. BI-RADS CATEGORY  0: Incomplete. Need additional imaging evaluation and/or prior mammograms for comparison. Electronically Signed   By: Claudie Revering M.D.   On: 04/25/2021 15:05   ECHOCARDIOGRAM COMPLETE  Result Date: 06/13/2021    ECHOCARDIOGRAM REPORT   Patient Name:   Tammy Nunez Date of Exam: 06/13/2021 Medical Rec #:  [id number]       Height:       61.0 in Accession #:    [id number]      Weight:       155.0 lb Date of Birth:  1951/05/28       BSA:          1.695 m Patient Age:    60 years        BP:           153/76 mmHg Patient Gender: F               HR:           76 bpm. Exam Location:  [facility] Procedure: 2D Echo, Color Doppler, Cardiac Doppler and Strain Analysis Indications:     Z09 Chemotherapy  History:         Patient has prior history of Echocardiogram examinations, most                  recent 06/07/2019. CAD, Stroke; Risk Factors:Diabetes and                  Hypertension. Invasive carcinoma of breast.  Sonographer:     Charmayne Sheer RDCS (AE) Referring Phys:  [id number] ZHOU YU Diagnosing Phys: Ida Rogue MD  Sonographer Comments: Global longitudinal strain was attempted. IMPRESSIONS  1. Left ventricular ejection fraction, by estimation, is 60 to 65%. The left ventricle has normal function. The left ventricle has no regional wall motion abnormalities. Left ventricular diastolic parameters are consistent with Grade I diastolic dysfunction (impaired relaxation). The average left ventricular global longitudinal strain is -22.5 %. The global longitudinal strain is normal.  2. Right ventricular systolic function is normal. The right ventricular size is normal. Tricuspid regurgitation signal is inadequate for assessing PA pressure. FINDINGS  Left Ventricle: Left ventricular ejection  fraction, by estimation, is 60 to 65%. The left ventricle has normal function. The left ventricle has no regional wall motion abnormalities. The average left ventricular global longitudinal strain is -22.5 %. The global longitudinal strain is normal. The left ventricular internal cavity size was normal in size. There is no left ventricular hypertrophy. Left ventricular diastolic parameters are consistent with Grade I diastolic dysfunction (impaired relaxation). Right Ventricle: The right ventricular size is normal. No increase in right ventricular wall thickness. Right ventricular systolic function is normal. Tricuspid regurgitation signal is inadequate for assessing PA pressure. Left Atrium: Left atrial size was normal in size. Right Atrium: Right atrial size was normal in size. Pericardium: There is no evidence of pericardial effusion. Mitral Valve: The  mitral valve is normal in structure. No evidence of mitral valve regurgitation. No evidence of mitral valve stenosis. MV peak gradient, 4.0 mmHg. The mean mitral valve gradient is 2.0 mmHg. Tricuspid Valve: The tricuspid valve is normal in structure. Tricuspid valve regurgitation is not demonstrated. No evidence of tricuspid stenosis. Aortic Valve: The aortic valve was not well visualized. There is mild calcification of the aortic valve. Aortic valve regurgitation is not visualized. Mild aortic valve sclerosis is present, with no evidence of aortic valve stenosis. Aortic valve mean gradient measures 5.0 mmHg. Aortic valve peak gradient measures 9.1 mmHg. Aortic valve area, by VTI measures 2.47 cm. Pulmonic Valve: The pulmonic valve was normal in structure. Pulmonic valve regurgitation is not visualized. No evidence of pulmonic stenosis. Aorta: The aortic root is normal in size and structure. Venous: The inferior vena cava is normal in size with greater than 50% respiratory variability, suggesting right atrial pressure of 3 mmHg. IAS/Shunts: No atrial level shunt  detected by color flow Doppler.  LEFT VENTRICLE PLAX 2D LVIDd:         3.50 cm  Diastology LVIDs:         2.60 cm  LV e' medial:    7.29 cm/s LV PW:         1.00 cm  LV E/e' medial:  10.3 LV IVS:        0.70 cm  LV e' lateral:   8.92 cm/s LVOT diam:     1.80 cm  LV E/e' lateral: 8.4 LV SV:         67 LV SV Index:   40       2D Longitudinal Strain LVOT Area:     2.54 cm 2D Strain GLS Avg:     -22.5 %  RIGHT VENTRICLE RV Basal diam:  3.00 cm TAPSE (M-mode): 2.4 cm LEFT ATRIUM             Index       RIGHT ATRIUM          Index LA diam:        3.00 cm 1.77 cm/m  RA Area:     7.18 cm LA Vol (A2C):   21.4 ml 12.63 ml/m RA Volume:   11.30 ml 6.67 ml/m LA Vol (A4C):   20.9 ml 12.33 ml/m LA Biplane Vol: 21.3 ml 12.57 ml/m  AORTIC VALVE                    PULMONIC VALVE AV Area (Vmax):    2.01 cm     PV Vmax:       0.90 m/s AV Area (Vmean):   2.20 cm     PV Vmean:      64.000 cm/s AV Area (VTI):     2.47 cm     PV VTI:        0.164 m AV Vmax:           151.00 cm/s  PV Peak grad:  3.2 mmHg AV Vmean:          105.000 cm/s PV Mean grad:  2.0 mmHg AV VTI:            0.273 m AV Peak Grad:      9.1 mmHg AV Mean Grad:      5.0 mmHg LVOT Vmax:         119.00 cm/s LVOT Vmean:        90.900 cm/s LVOT VTI:  0.265 m LVOT/AV VTI ratio: 0.97  AORTA Ao Root diam: 2.30 cm MITRAL VALVE MV Area (PHT): 3.01 cm     SHUNTS MV Area VTI:   2.69 cm     Systemic VTI:  0.26 m MV Peak grad:  4.0 mmHg     Systemic Diam: 1.80 cm MV Mean grad:  2.0 mmHg MV Vmax:       1.00 m/s MV Vmean:      61.8 cm/s MV Decel Time: 252 msec MV E velocity: 75.00 cm/s MV A velocity: 101.00 cm/s MV E/A ratio:  0.74 Ida Rogue MD Electronically signed by Ida Rogue MD Signature Date/Time: 06/13/2021/5:34:59 PM    Final    MM CLIP PLACEMENT LEFT  Result Date: 05/07/2021 CLINICAL DATA:  Evaluate post biopsy marker clip placements following ultrasound-guided core needle biopsy of 2 left breast masses. EXAM: DIAGNOSTIC LEFT MAMMOGRAM POST  ULTRASOUND BIOPSY COMPARISON:  Previous exam(s). FINDINGS: Mammographic images were obtained following ultrasound guided biopsy of 2 left breast masses. The vision biopsy clip lies within the larger mass, surrounded by pleomorphic calcifications, and the cylinder/mini Cork biopsy clip lies in the posterior mass. IMPRESSION: Appropriate positioning of the venous and mini Cork shaped biopsy marking clip at the site of biopsy in the upper left breast as detailed. Final Assessment: Post Procedure Mammograms for Marker Placement Electronically Signed   By: Lajean Manes M.D.   On: 05/07/2021 09:12  [place] LT BREAST BX W LOC DEV 1ST LESION IMG BX SPEC US GUIDE  Addendum Date: 05/10/2021   ADDENDUM REPORT: 05/10/2021 12:41 ADDENDUM: PATHOLOGY revealed: Site A. BREAST, LEFT, 12 O'CLOCK, 5 CM FROM NIPPLE; ULTRASOUND-GUIDED CORE BIOPSY: - INVASIVE MAMMARY CARCINOMA, NO SPECIAL TYPE. Size of invasive carcinoma: 2.5 mm in this sample. Histologic grade of invasive carcinoma: Grade 3. Ductal carcinoma in situ (DCIS): Present, high-grade, associated with calcifications. Lymphovascular invasion: Not identified Pathology results are CONCORDANT with imaging findings, per Dr. Lajean Manes. PATHOLOGY revealed: Site B. BREAST, LEFT, 11 O'CLOCK, 10 CM FROM NIPPLE; ULTRASOUND-GUIDED CORE BIOPSY: - INVASIVE MAMMARY CARCINOMA, NO SPECIAL TYPE. Size of invasive carcinoma: 8 mm in this sample. Histologic grade of invasive carcinoma: Grade 2. DCIS: Not identified. Lymphovascular invasion: Not identified. Comment: The invasive carcinomas in A and B differ in grade; A is grade 3 due to higher mitotic rate. There are also differences in growth patterns with a more nested and solid pattern in B. Pathology results are CONCORDANT with imaging findings, per Dr. Lajean Manes. Pathology results and recommendations below were discussed with patient by telephone on 05/08/2021. Patient reported biopsy site within normal limits with slight tenderness at the  site. Post biopsy care instructions were reviewed, questions were answered and my direct phone number was provided to patient. Patient was instructed to call [facility] if any concerns or questions arise related to the biopsy. Recommendations: 1. Surgical consultation for both sites (A and B). Request for surgical consultation relayed to Al Pimple RN and Tanya Nones RN at [facility] by Electa Sniff RN 05/08/2021. 2. Consider bilateral breast MRI due to high grade DCIS and to evaluate extent of breast disease given additional masses identified on diagnostic mammogram. Pathology results reported by Electa Sniff RN on 05/10/2021. Electronically Signed   By: Lajean Manes M.D.   On: 05/10/2021 12:41   Result Date: 05/10/2021 CLINICAL DATA:  Patient presents for biopsy of 2 left breast masses, part of a conglomerate of masses spanning over 8 cm in the left breast, associated  with pleomorphic calcifications. EXAM: ULTRASOUND GUIDED LEFT BREAST CORE NEEDLE BIOPSY: 2 MASSES BIOPSIED COMPARISON:  Previous exam(s). PROCEDURE: I met with the patient and we discussed the procedure of ultrasound-guided biopsy, including benefits and alternatives. We discussed the high likelihood of a successful procedure. We discussed the risks of the procedure, including infection, bleeding, tissue injury, clip migration, and inadequate sampling. Informed written consent was given. The usual time-out protocol was performed immediately prior to the procedure. Lesion #1: 12 o'clock, 5 cmfn. Lesion quadrant: Upper outer quadrant Using sterile technique and 1% Lidocaine as local anesthetic, under direct ultrasound visualization, a 12 gauge spring-loaded device was used to perform biopsy of the 4.3 cm mass using a inferomedial approach. At the conclusion of the procedure a vision tissue marker clip was deployed into the biopsy cavity. Lesion #2: 11 o'clock, 10 cmfn. Lesion quadrant: Upper outer quadrant Using  sterile technique and 1% Lidocaine as local anesthetic, under direct ultrasound visualization, a 14 gauge spring-loaded device was used to perform biopsy of the 9 mm mass using a inferior approach. At the conclusion of the procedure mini Cork shaped tissue marker clip was deployed into the biopsy cavity. Follow up 2 view mammogram was performed and dictated separately. IMPRESSION: Ultrasound guided biopsy of 2 left breast masses. No apparent complications. Electronically Signed: By: Lajean Manes M.D. On: 05/07/2021 09:10  [place] LT BREAST BX W LOC DEV EA ADD LESION IMG BX SPEC US GUIDE  Addendum Date: 05/10/2021   ADDENDUM REPORT: 05/10/2021 12:41 ADDENDUM: PATHOLOGY revealed: Site A. BREAST, LEFT, 12 O'CLOCK, 5 CM FROM NIPPLE; ULTRASOUND-GUIDED CORE BIOPSY: - INVASIVE MAMMARY CARCINOMA, NO SPECIAL TYPE. Size of invasive carcinoma: 2.5 mm in this sample. Histologic grade of invasive carcinoma: Grade 3. Ductal carcinoma in situ (DCIS): Present, high-grade, associated with calcifications. Lymphovascular invasion: Not identified Pathology results are CONCORDANT with imaging findings, per Dr. Lajean Manes. PATHOLOGY revealed: Site B. BREAST, LEFT, 11 O'CLOCK, 10 CM FROM NIPPLE; ULTRASOUND-GUIDED CORE BIOPSY: - INVASIVE MAMMARY CARCINOMA, NO SPECIAL TYPE. Size of invasive carcinoma: 8 mm in this sample. Histologic grade of invasive carcinoma: Grade 2. DCIS: Not identified. Lymphovascular invasion: Not identified. Comment: The invasive carcinomas in A and B differ in grade; A is grade 3 due to higher mitotic rate. There are also differences in growth patterns with a more nested and solid pattern in B. Pathology results are CONCORDANT with imaging findings, per Dr. Lajean Manes. Pathology results and recommendations below were discussed with patient by telephone on 05/08/2021. Patient reported biopsy site within normal limits with slight tenderness at the site. Post biopsy care instructions were reviewed, questions were  answered and my direct phone number was provided to patient. Patient was instructed to call [facility] if any concerns or questions arise related to the biopsy. Recommendations: 1. Surgical consultation for both sites (A and B). Request for surgical consultation relayed to Al Pimple RN and Tanya Nones RN at [facility] by Electa Sniff RN 05/08/2021. 2. Consider bilateral breast MRI due to high grade DCIS and to evaluate extent of breast disease given additional masses identified on diagnostic mammogram. Pathology results reported by Electa Sniff RN on 05/10/2021. Electronically Signed   By: Lajean Manes M.D.   On: 05/10/2021 12:41   Result Date: 05/10/2021 CLINICAL DATA:  Patient presents for biopsy of 2 left breast masses, part of a conglomerate of masses spanning over 8 cm in the left breast, associated with pleomorphic calcifications. EXAM: ULTRASOUND GUIDED LEFT BREAST CORE NEEDLE BIOPSY:  2 MASSES BIOPSIED COMPARISON:  Previous exam(s). PROCEDURE: I met with the patient and we discussed the procedure of ultrasound-guided biopsy, including benefits and alternatives. We discussed the high likelihood of a successful procedure. We discussed the risks of the procedure, including infection, bleeding, tissue injury, clip migration, and inadequate sampling. Informed written consent was given. The usual time-out protocol was performed immediately prior to the procedure. Lesion #1: 12 o'clock, 5 cmfn. Lesion quadrant: Upper outer quadrant Using sterile technique and 1% Lidocaine as local anesthetic, under direct ultrasound visualization, a 12 gauge spring-loaded device was used to perform biopsy of the 4.3 cm mass using a inferomedial approach. At the conclusion of the procedure a vision tissue marker clip was deployed into the biopsy cavity. Lesion #2: 11 o'clock, 10 cmfn. Lesion quadrant: Upper outer quadrant Using sterile technique and 1% Lidocaine as local anesthetic, under direct  ultrasound visualization, a 14 gauge spring-loaded device was used to perform biopsy of the 9 mm mass using a inferior approach. At the conclusion of the procedure mini Cork shaped tissue marker clip was deployed into the biopsy cavity. Follow up 2 view mammogram was performed and dictated separately. IMPRESSION: Ultrasound guided biopsy of 2 left breast masses. No apparent complications. Electronically Signed: By: Lajean Manes M.D. On: 05/07/2021 09:10      ASSESSMENT & PLAN:  1. Chemotherapy induced nausea and vomiting   2. Malignant neoplasm of left breast in female, estrogen receptor positive, unspecified site of breast ([place])   3. Thrombocytopenia ([place])   4. Encounter for antineoplastic chemotherapy   Cancer Staging Invasive carcinoma of breast ([place]) Staging form: Breast, AJCC 8th Edition - Clinical stage from 05/16/2021: Stage IIB (cT3, cN0, cM0, G3, ER+, PR+, HER2-) - Signed by [name], MD on 06/03/2021  #Left breast invasive carcinoma, multiple sites.  ER 90%, PR 11-50% positive, HER2 negative by IHC cT3 N0 On neoadjuvant chemotherapy Tolerated 1 cycle of ddAC.  Labs are reviewed and discussed with patient. Proceed with cycle 2 ddAC - 33m/m2 adriamycin.  Onpro Neulasta for GCSF support.    # chemotherapy induced thrombocytopenia. Resolved.  Follow up in 2 weeks for cycle 3 ddAC.   All questions were answered. The patient knows to call the clinic with any problems questions or concerns.  cc McLean-Scocuzza, TOlam Idler MD, PhD Hematology Oncology [facility] CampusPager- [phone]/20/2022

## 2021-08-02 ENCOUNTER — Encounter: Payer: Self-pay | Admitting: Oncology

## 2021-08-02 ENCOUNTER — Inpatient Hospital Stay: Payer: PPO | Attending: Oncology

## 2021-08-02 ENCOUNTER — Inpatient Hospital Stay: Payer: PPO

## 2021-08-02 ENCOUNTER — Other Ambulatory Visit: Payer: Self-pay

## 2021-08-02 ENCOUNTER — Inpatient Hospital Stay (HOSPITAL_BASED_OUTPATIENT_CLINIC_OR_DEPARTMENT_OTHER): Payer: PPO | Admitting: Oncology

## 2021-08-02 VITALS — BP 131/67 | HR 93 | Temp 98.4°F | Resp 18 | Wt 140.0 lb

## 2021-08-02 DIAGNOSIS — Z7982 Long term (current) use of aspirin: Secondary | ICD-10-CM | POA: Diagnosis not present

## 2021-08-02 DIAGNOSIS — Z7984 Long term (current) use of oral hypoglycemic drugs: Secondary | ICD-10-CM | POA: Insufficient documentation

## 2021-08-02 DIAGNOSIS — C50912 Malignant neoplasm of unspecified site of left female breast: Secondary | ICD-10-CM

## 2021-08-02 DIAGNOSIS — Z8673 Personal history of transient ischemic attack (TIA), and cerebral infarction without residual deficits: Secondary | ICD-10-CM | POA: Diagnosis not present

## 2021-08-02 DIAGNOSIS — T451X5A Adverse effect of antineoplastic and immunosuppressive drugs, initial encounter: Secondary | ICD-10-CM | POA: Insufficient documentation

## 2021-08-02 DIAGNOSIS — Z803 Family history of malignant neoplasm of breast: Secondary | ICD-10-CM | POA: Insufficient documentation

## 2021-08-02 DIAGNOSIS — C50812 Malignant neoplasm of overlapping sites of left female breast: Secondary | ICD-10-CM | POA: Diagnosis not present

## 2021-08-02 DIAGNOSIS — E119 Type 2 diabetes mellitus without complications: Secondary | ICD-10-CM | POA: Insufficient documentation

## 2021-08-02 DIAGNOSIS — Z5111 Encounter for antineoplastic chemotherapy: Secondary | ICD-10-CM | POA: Diagnosis not present

## 2021-08-02 DIAGNOSIS — Z5189 Encounter for other specified aftercare: Secondary | ICD-10-CM | POA: Diagnosis not present

## 2021-08-02 DIAGNOSIS — I2510 Atherosclerotic heart disease of native coronary artery without angina pectoris: Secondary | ICD-10-CM | POA: Insufficient documentation

## 2021-08-02 DIAGNOSIS — Z170 Estrogen receptor positive status [ER+]: Secondary | ICD-10-CM | POA: Diagnosis not present

## 2021-08-02 DIAGNOSIS — Z79899 Other long term (current) drug therapy: Secondary | ICD-10-CM | POA: Diagnosis not present

## 2021-08-02 DIAGNOSIS — I119 Hypertensive heart disease without heart failure: Secondary | ICD-10-CM | POA: Insufficient documentation

## 2021-08-02 DIAGNOSIS — R112 Nausea with vomiting, unspecified: Secondary | ICD-10-CM

## 2021-08-02 DIAGNOSIS — I700 Atherosclerosis of aorta: Secondary | ICD-10-CM | POA: Diagnosis not present

## 2021-08-02 DIAGNOSIS — Z7952 Long term (current) use of systemic steroids: Secondary | ICD-10-CM | POA: Diagnosis not present

## 2021-08-02 DIAGNOSIS — D696 Thrombocytopenia, unspecified: Secondary | ICD-10-CM

## 2021-08-02 LAB — COMPREHENSIVE METABOLIC PANEL
ALT: 15 U/L (ref 0–44)
AST: 13 U/L — ABNORMAL LOW (ref 15–41)
Albumin: 4.1 g/dL (ref 3.5–5.0)
Alkaline Phosphatase: 77 U/L (ref 38–126)
Anion gap: 9 (ref 5–15)
BUN: 28 mg/dL — ABNORMAL HIGH (ref 8–23)
CO2: 20 mmol/L — ABNORMAL LOW (ref 22–32)
Calcium: 9.2 mg/dL (ref 8.9–10.3)
Chloride: 111 mmol/L (ref 98–111)
Creatinine, Ser: 0.87 mg/dL (ref 0.44–1.00)
GFR, Estimated: >60 mL/min (ref >60)
Glucose, Bld: 113 mg/dL — ABNORMAL HIGH (ref 70–99)
Potassium: 4.7 mmol/L (ref 3.5–5.1)
Sodium: 140 mmol/L (ref 135–145)
Total Bilirubin: 0.2 mg/dL — ABNORMAL LOW (ref 0.3–1.2)
Total Protein: 7.4 g/dL (ref 6.5–8.1)

## 2021-08-02 LAB — CBC WITH DIFFERENTIAL/PLATELET
Abs Immature Granulocytes: 0.41 10*3/uL — ABNORMAL HIGH (ref 0.00–0.07)
Basophils Absolute: 0.1 10*3/uL (ref 0.0–0.1)
Basophils Relative: 1 %
Eosinophils Absolute: 0.1 10*3/uL (ref 0.0–0.5)
Eosinophils Relative: 1 %
HCT: 33.3 % — ABNORMAL LOW (ref 36.0–46.0)
Hemoglobin: 10.8 g/dL — ABNORMAL LOW (ref 12.0–15.0)
Immature Granulocytes: 3 %
Lymphocytes Relative: 12 %
Lymphs Abs: 1.7 10*3/uL (ref 0.7–4.0)
MCH: 30.9 pg (ref 26.0–34.0)
MCHC: 32.4 g/dL (ref 30.0–36.0)
MCV: 95.4 fL (ref 80.0–100.0)
Monocytes Absolute: 1.3 10*3/uL — ABNORMAL HIGH (ref 0.1–1.0)
Monocytes Relative: 9 %
Neutro Abs: 10.5 10*3/uL — ABNORMAL HIGH (ref 1.7–7.7)
Neutrophils Relative %: 74 %
Platelets: 230 10*3/uL (ref 150–400)
RBC: 3.49 MIL/uL — ABNORMAL LOW (ref 3.87–5.11)
RDW: 14.6 % (ref 11.5–15.5)
WBC: 14 10*3/uL — ABNORMAL HIGH (ref 4.0–10.5)
nRBC: 0.6 % — ABNORMAL HIGH (ref 0.0–0.2)

## 2021-08-02 MED ORDER — SODIUM CHLORIDE 0.9 % IV SOLN
150.0000 mg | Freq: Once | INTRAVENOUS | Status: AC
  Administered 2021-08-02: 150 mg via INTRAVENOUS
  Filled 2021-08-02: qty 150

## 2021-08-02 MED ORDER — SODIUM CHLORIDE 0.9% FLUSH
10.0000 mL | Freq: Once | INTRAVENOUS | Status: DC
  Filled 2021-08-02: qty 10

## 2021-08-02 MED ORDER — PALONOSETRON HCL INJECTION 0.25 MG/5ML
0.2500 mg | Freq: Once | INTRAVENOUS | Status: AC
  Administered 2021-08-02: 0.25 mg via INTRAVENOUS
  Filled 2021-08-02: qty 5

## 2021-08-02 MED ORDER — DOXORUBICIN HCL CHEMO IV INJECTION 2 MG/ML
60.0000 mg/m2 | Freq: Once | INTRAVENOUS | Status: AC
  Administered 2021-08-02: 100 mg via INTRAVENOUS
  Filled 2021-08-02: qty 50

## 2021-08-02 MED ORDER — SODIUM CHLORIDE 0.9 % IV SOLN
10.0000 mg | Freq: Once | INTRAVENOUS | Status: AC
  Administered 2021-08-02: 10 mg via INTRAVENOUS
  Filled 2021-08-02: qty 10

## 2021-08-02 MED ORDER — SODIUM CHLORIDE 0.9% FLUSH
10.0000 mL | Freq: Once | INTRAVENOUS | Status: AC
  Administered 2021-08-02: 10 mL via INTRAVENOUS
  Filled 2021-08-02: qty 10

## 2021-08-02 MED ORDER — PEGFILGRASTIM 6 MG/0.6ML ~~LOC~~ PSKT
6.0000 mg | PREFILLED_SYRINGE | Freq: Once | SUBCUTANEOUS | Status: AC
  Administered 2021-08-02: 6 mg via SUBCUTANEOUS
  Filled 2021-08-02: qty 0.6

## 2021-08-02 MED ORDER — SODIUM CHLORIDE 0.9 % IV SOLN
600.0000 mg/m2 | Freq: Once | INTRAVENOUS | Status: AC
  Administered 2021-08-02: 1000 mg via INTRAVENOUS
  Filled 2021-08-02: qty 50

## 2021-08-02 MED ORDER — HEPARIN SOD (PORK) LOCK FLUSH 100 UNIT/ML IV SOLN
500.0000 [IU] | Freq: Once | INTRAVENOUS | Status: AC
  Administered 2021-08-02: 500 [IU] via INTRAVENOUS
  Filled 2021-08-02: qty 5

## 2021-08-02 MED ORDER — SODIUM CHLORIDE 0.9 % IV SOLN
Freq: Once | INTRAVENOUS | Status: AC
  Filled 2021-08-02: qty 250

## 2021-08-02 MED ORDER — HEPARIN SOD (PORK) LOCK FLUSH 100 UNIT/ML IV SOLN
500.0000 [IU] | Freq: Once | INTRAVENOUS | Status: DC
  Filled 2021-08-02: qty 5

## 2021-08-02 MED ORDER — HEPARIN SOD (PORK) LOCK FLUSH 100 UNIT/ML IV SOLN
INTRAVENOUS | Status: AC
  Filled 2021-08-02: qty 5

## 2021-08-02 NOTE — Progress Notes (Signed)
Hematology/Oncology Progress note [facility] Telephone:([phone] [phone]   Patient Care Team: McLean-Scocuzza, Nino Glow, MD as PCP - General (Internal Medicine) De Hollingshead, [place] as Pharmacist (Pharmacist) Theodore Demark, RN as Oncology Nurse Navigator  REFERRING PROVIDER: McLean-Scocuzza, Olivia Mackie *  CHIEF COMPLAINTS/REASON FOR VISIT:  multifocal left breast cancer  HISTORY OF PRESENTING ILLNESS:   Tammy Nunez is a  70 y.o.  female with [place] listed below was seen in consultation at the request of  McLean-Scocuzza, Olivia Mackie *  for evaluation of multifocal  04/24/2021, screening mammogram showed large asymmetry in the superior aspect of the breast with associated calcifications and possible masses and a subtle distortions.  No suspicious findings for malignancy right breast.  05/02/2021 left breast diagnostic mammogram and ultrasound showed multiple irregular mass in the left breast. Mammogram mass 1 irregular mass associated with distortion in the upper breast at middle depth, multiple adjacent and possible continuous irregular masses, conglomeration of masses measures at least 5.6 cm.  Associated pleomorphic calcifications extending at least 5 x 3.9 x 3.7 cm Mass 2, 1 cm mass in the upper breast at the middle to posterior depth. Mass 3, 5 mm irregular mass in the upper breast at posterior depth. In total, mass 1-mass 3 span at least 8.2 cm in anterior-posterior extent.  By ultrasound  mass 1 12:00 5 cm from nipple, 2.7 x 1.5 x 4.3 cm Mass 2, 12:00 12 cm from nipple, 0.9 x 0.9 x 0.5 cm Mass 3   12:00 14 cm from nipple, 0.4 x 0.4 x 0.4 cm-uses 2.4 cm superior to mass to Mass 4   11:00  No suspicious left axillary adenopathy.   05/07/2021 -Ultrasound-guided breast biopsy Breast left 12:00 mass 5 cm from nipple biopsy showed invasive mammary carcinoma, no special type, grade 3, high-grade DCIS present, no LVI, ER 90%, PR 11-50%, HER2  negative by IHC Breast left 11:00 mass 10 cm from nipple biopsy showed invasive mammary carcinoma, no special type, grade 2, no DCIS/LVI identified.ER 90%, PR 11-50%, HER2 negative by IHC  Patient is married has no children.  Denies any hormone replacement, oral contraceptive use, family history of breast cancer.  Denies any chest radiation. Patient has a history of stroke with chronic residual weakness of the left side.  She walks with a cane.  05/22/2021 MRI breast showed that nonmass like malignancy spanning over 8 cm, abuting anterior aspect of pectoralis muscle. Discussed with Dr.Cintron and we agree that she will need mastectomy. There is concern of possible positive margin. I recommend neoadjuvant chemotherapy  # medi port placed on 06/06/2021. 06/13/2021 pre chemotherapy Echo. LVEF 60-65%.  Normal global longitudinal strain. Grade 1 diastolic dysfunction.   # 05/22/2021 MRI breast showed  Biopsy-proven malignancy within the UPPER LEFT breast spanning a distance of 8.3 x 4.2 x 4 cm nonmass like enhancement abuts the anterior aspect of the LEFT pectoralis muscle without gross invasion. Oncotype Dx 27. Discussed with Dr.Cintron, there is concern of positive margin.   INTERVAL HISTORY Tammy Nunez is a 70 y.o. female who has above history reviewed by me today presents for follow up visit for chemotherapy of breast cancer Patient was accompanied by her husband. She feels well. Mild nausea for which she takes antiemetics with symptom relief No fever or chills today     Review of Systems  Constitutional:  Negative for appetite change, chills, fatigue and fever.  HENT:   Negative for hearing loss and voice change.   Eyes:  Negative for eye problems.  Respiratory:  Negative for chest tightness and cough.   Cardiovascular:  Negative for chest pain.  Gastrointestinal:  Negative for abdominal distention, abdominal pain, blood in stool and nausea.  Endocrine: Negative for hot flashes.   Genitourinary:  Negative for difficulty urinating and frequency.   Musculoskeletal:  Negative for arthralgias.  Skin:  Negative for itching and rash.  Neurological:  Negative for extremity weakness.  Hematological:  Negative for adenopathy. Does not bruise/bleed easily.  Psychiatric/Behavioral:  Negative for confusion.    MEDICAL HISTORY:  Past Medical History:  Diagnosis Date   Breast cancer in female [facility]) 05/08/2021   Chronic left shoulder pain    Coronary artery disease    Diabetes mellitus without complication (HCC)    Dysrhythmia    Hypertension    Paralysis (HCC)    weakness left upper amd lower extremity   PONV (postoperative nausea and vomiting)    Stroke [facility])     SURGICAL HISTORY: Past Surgical History:  Procedure Laterality Date   BREAST BIOPSY Left 05/07/2021   12:00 5cmfn vision clip path pending   BREAST BIOPSY Left 05/07/2021   11:00 10 cmfn mini cork clip path pending   CAROTID PTA/STENT INTERVENTION Left 07/20/2019   Procedure: CAROTID PTA/STENT INTERVENTION;  Surgeon: Katha Cabal, MD;  Location: [place] [facility];  Service: Cardiovascular;  Laterality: Left;   ECTOPIC PREGNANCY SURGERY     PORTACATH PLACEMENT N/A 06/06/2021   Procedure: INSERTION PORT-A-CATH;  Surgeon: Herbert Pun, MD;  Location: [facility] ORS;  Service: General;  Laterality: N/A;    SOCIAL HISTORY: Social History   Socioeconomic History   Marital status: Married    Spouse name: Not on file   Number of children: Not on file   Years of education: Not on file   Highest education level: Not on file  Occupational History   Not on file  Tobacco Use   Smoking status: Former    Packs/day: 0.50    Years: 15.00    Pack years: 7.50    Types: Cigarettes    Quit date: 04/11/2019    Years since quitting: 2.3   Smokeless tobacco: Never  Vaping Use   Vaping Use: Never used  Substance and Sexual Activity   Alcohol use: Never   Drug use: Never   Sexual activity: Not  Currently  Other Topics Concern   Not on file  Social History Narrative   No kids    Married with husband    Used to work [employer] broadcast assistant    Social Determinants of Radio broadcast assistant Strain: Low Risk    Difficulty of Paying Living Expenses: Not hard at all  Food Insecurity: Not on file  Transportation Needs: Not on file  Physical Activity: Sufficiently Active   Days of Exercise per Week: 7 days   Minutes of Exercise per Session: 30 min  Stress: Not on file  Social Connections: Not on file  Intimate Partner Violence: Not on file    FAMILY HISTORY: Family History  Problem Relation Age of Onset   Diabetes type II Mother    Hypertension Father    Heart attack Father    Diabetes type II Brother    Breast cancer Neg Hx     ALLERGIES:  has No Known Allergies.  MEDICATIONS:  Current Outpatient Medications  Medication Sig Dispense Refill   amLODipine (NORVASC) 10 MG tablet Take 1 tablet by mouth once daily 90 tablet 0  aspirin EC 81 MG EC tablet Take 1 tablet (81 mg total) by mouth daily.     atorvastatin (LIPITOR) 80 MG tablet Take 1 tablet (80 mg total) by mouth daily. 90 tablet 3   blood glucose meter kit and supplies 1 each by Other route 4 (four) times daily. Dispense based on patient and insurance preference. Four times daily as directed. (FOR ICD-10 E10.9, E11.9). 1 each 0   carvedilol (COREG) 25 MG tablet Take 1 tablet (25 mg total) by mouth 2 (two) times daily with a meal. 180 tablet 3   clopidogrel (PLAVIX) 75 MG tablet Take 1 tablet (75 mg total) by mouth daily. Further refills [facility] 90 tablet 3   Continuous Blood Gluc Sensor (FREESTYLE LIBRE 2 SENSOR) MISC Use to check glucose at least every 8 hours 6 each 3   dexamethasone (DECADRON) 4 MG tablet Take 2 tablets (8 mg total) by mouth daily. Take daily for 3 days after chemo. Take with food. 30 tablet 1   empagliflozin (JARDIANCE) 25 MG TABS tablet Take 1 tablet (25 mg total) by mouth  daily. 90 tablet 3   lidocaine-prilocaine (EMLA) cream Apply to affected area once 30 g 3   lisinopril (ZESTRIL) 40 MG tablet Take 1 tablet (40 mg total) by mouth daily. 90 tablet 3   loratadine (CLARITIN) 10 MG tablet Take 10 mg by mouth daily.     metFORMIN (GLUCOPHAGE XR) 500 MG 24 hr tablet Take 2 tablets (1,000 mg total) by mouth daily with breakfast. (Patient taking differently: Take 500 mg by mouth 2 (two) times daily.) 180 tablet 3   ondansetron (ZOFRAN) 8 MG tablet Take 1 tablet (8 mg total) by mouth 2 (two) times daily as needed. Start on the third day after chemotherapy. 30 tablet 1   potassium chloride SA (KLOR-CON M20) 20 MEQ tablet Take 1 tablet (20 mEq total) by mouth daily. 90 tablet 3   prochlorperazine (COMPAZINE) 10 MG tablet Take 1 tablet (10 mg total) by mouth every 6 (six) hours as needed (Nausea or vomiting). 30 tablet 1   vitamin B-12 (CYANOCOBALAMIN) 1000 MCG tablet Take 1 tablet (1,000 mcg total) by mouth daily. 30 tablet 1   Vitamin D, Cholecalciferol, 50 MCG (2000 UT) CAPS Take 2,000 Units by mouth.     No current facility-administered medications for this visit.   Facility-Administered Medications Ordered in Other Visits  Medication Dose Route Frequency Provider Last Rate Last Admin   heparin lock flush 100 UNIT/ML injection              PHYSICAL EXAMINATION: ECOG PERFORMANCE STATUS: 1 - Symptomatic but completely ambulatory Vitals:   08/02/21 0852  BP: 131/67  Pulse: 93  Resp: 18  Temp: 98.4 F (36.9 C)   Filed Weights   08/02/21 0852  Weight: 140 lb (63.5 kg)    Physical Exam Constitutional:      General: She is not in acute distress. HENT:     Head: Normocephalic and atraumatic.  Eyes:     General: No scleral icterus. Cardiovascular:     Rate and Rhythm: Normal rate and regular rhythm.     Heart sounds: Normal heart sounds.  Pulmonary:     Effort: Pulmonary effort is normal. No respiratory distress.     Breath sounds: No wheezing.   Abdominal:     General: Bowel sounds are normal. There is no distension.     Palpations: Abdomen is soft.  Musculoskeletal:  General: No deformity. Normal range of motion.     Cervical back: Normal range of motion and neck supple.  Skin:    General: Skin is warm and dry.     Findings: No erythema or rash.  Neurological:     Mental Status: She is alert and oriented to person, place, and time. Mental status is at baseline.     Comments: Chronic left upper and lower extremity weakness.  Psychiatric:        Mood and Affect: Mood normal.   Left breast mass has decreasing size     LABORATORY DATA:  I have reviewed the data as listed Lab Results  Component Value Date   WBC 14.0 (H) 08/02/2021   HGB 10.8 (L) 08/02/2021   HCT 33.3 (L) 08/02/2021   MCV 95.4 08/02/2021   PLT 230 08/02/2021   Recent Labs    07/05/21 0814 07/12/21 1025 08/02/21 0823  NA 140 139 140  K 3.8 3.8 4.7  CL 108 109 111  CO2 23 22 20*  GLUCOSE 170* 106* 113*  BUN 17 20 28*  CREATININE 0.92 0.97 0.87  CALCIUM 10.0 9.0 9.2  GFRNONAA >60 >60 >60  PROT 6.6 7.2 7.4  ALBUMIN 3.9 4.0 4.1  AST 18 16 13*  ALT 18 21 15   ALKPHOS 78 70 77  BILITOT 0.5 0.5 0.2*    Iron/TIBC/Ferritin/ %Sat    Component Value Date/Time   IRON 113 12/08/2019 1015   TIBC 317 12/08/2019 1015   FERRITIN 67 12/08/2019 1015   IRONPCTSAT 36 12/08/2019 1015       RADIOGRAPHIC STUDIES: I have personally reviewed the radiological images as listed and agreed with the findings in the report. MR BREAST BILATERAL W WO CONTRAST INC CAD  Result Date: 05/22/2021 CLINICAL DATA:  70 year old female for staging of newly diagnosed LEFT breast cancer. LABS:  None performed today EXAM: BILATERAL BREAST MRI WITH AND WITHOUT CONTRAST TECHNIQUE: Multiplanar, multisequence MR images of both breasts were obtained prior to and following the intravenous administration of 6 ml of Gadavist Three-dimensional MR images were rendered by  post-processing of the original MR data on an independent workstation. The three-dimensional MR images were interpreted, and findings are reported in the following complete MRI report for this study. Three dimensional images were evaluated at the independent interpreting workstation using the DynaCAD thin client. COMPARISON:  Previous exam(s). FINDINGS: Breast composition: c. Heterogeneous fibroglandular tissue. Background parenchymal enhancement: Minimal Right breast: No mass or abnormal enhancement. Left breast: A 8.3 x 4.2 x 4 cm (AP x transverse x CC) area of non masslike enhancement and scattered enhancing masses is identified within the UPPER LEFT breast, from middle to posterior depth, compatible with biopsy-proven malignancy. Artifacts from biopsy clips within the mid and anterior aspects of this abnormal enhancement are noted. Non masslike enhancement extends to the LEFT pectoralis muscle without gross muscle invasion. Lymph nodes: No abnormally enlarged or thickened lymph nodes are noted. Ancillary findings:  None. IMPRESSION: 1. Biopsy-proven malignancy within the UPPER LEFT breast spanning a distance of 8.3 x 4.2 x 4 cm. Nonmasslike enhancement abuts the anterior aspect of the LEFT pectoralis muscle without gross invasion. 2. No MR evidence of RIGHT breast malignancy. No abnormal appearing lymph nodes. RECOMMENDATION: Treatment plan BI-RADS CATEGORY  6: Known biopsy-proven malignancy. Electronically Signed   By: Margarette Canada M.D.   On: 05/22/2021 11:20  DG Chest Port 1 View  Result Date: 06/06/2021 CLINICAL DATA:  Post Port-A-Cath placement EXAM: PORTABLE CHEST 1 VIEW  COMPARISON:  Portable exam 1234 hours without priors for comparison FINDINGS: RIGHT jugular Port-A-Cath with tip projecting over SVC. Normal heart size, mediastinal contours, and pulmonary vascularity. Atherosclerotic calcification aorta. Lungs clear. No acute infiltrate, pleural effusion, or pneumothorax. Osseous structures  unremarkable. IMPRESSION: No pneumothorax following RIGHT jugular Port-A-Cath insertion. Aortic Atherosclerosis (ICD10-I70.0). Electronically Signed   By: Lavonia Dana M.D.   On: 06/06/2021 13:24   DG C-Arm 1-60 Min-No Report  Result Date: 06/06/2021 Fluoroscopy was utilized by the requesting physician.  No radiographic interpretation.   ECHOCARDIOGRAM COMPLETE  Result Date: 06/13/2021    ECHOCARDIOGRAM REPORT   Patient Name:   WENDELYN KIESLING Date of Exam: 06/13/2021 Medical Rec #:  [id number]       Height:       61.0 in Accession #:    [id number]      Weight:       155.0 lb Date of Birth:  07-22-1951       BSA:          1.695 m Patient Age:    77 years        BP:           153/76 mmHg Patient Gender: F               HR:           76 bpm. Exam Location:  [facility] Procedure: 2D Echo, Color Doppler, Cardiac Doppler and Strain Analysis Indications:     Z09 Chemotherapy  History:         Patient has prior history of Echocardiogram examinations, most                  recent 06/07/2019. CAD, Stroke; Risk Factors:Diabetes and                  Hypertension. Invasive carcinoma of breast.  Sonographer:     Charmayne Sheer RDCS (AE) Referring Phys:  [id number] Chandy Tarman Diagnosing Phys: Ida Rogue MD  Sonographer Comments: Global longitudinal strain was attempted. IMPRESSIONS  1. Left ventricular ejection fraction, by estimation, is 60 to 65%. The left ventricle has normal function. The left ventricle has no regional wall motion abnormalities. Left ventricular diastolic parameters are consistent with Grade I diastolic dysfunction (impaired relaxation). The average left ventricular global longitudinal strain is -22.5 %. The global longitudinal strain is normal.  2. Right ventricular systolic function is normal. The right ventricular size is normal. Tricuspid regurgitation signal is inadequate for assessing PA pressure. FINDINGS  Left Ventricle: Left ventricular ejection fraction, by estimation, is 60 to 65%. The left ventricle has  normal function. The left ventricle has no regional wall motion abnormalities. The average left ventricular global longitudinal strain is -22.5 %. The global longitudinal strain is normal. The left ventricular internal cavity size was normal in size. There is no left ventricular hypertrophy. Left ventricular diastolic parameters are consistent with Grade I diastolic dysfunction (impaired relaxation). Right Ventricle: The right ventricular size is normal. No increase in right ventricular wall thickness. Right ventricular systolic function is normal. Tricuspid regurgitation signal is inadequate for assessing PA pressure. Left Atrium: Left atrial size was normal in size. Right Atrium: Right atrial size was normal in size. Pericardium: There is no evidence of pericardial effusion. Mitral Valve: The mitral valve is normal in structure. No evidence of mitral valve regurgitation. No evidence of mitral valve stenosis. MV peak gradient, 4.0 mmHg. The mean mitral valve gradient is 2.0 mmHg. Tricuspid Valve: The tricuspid valve is  normal in structure. Tricuspid valve regurgitation is not demonstrated. No evidence of tricuspid stenosis. Aortic Valve: The aortic valve was not well visualized. There is mild calcification of the aortic valve. Aortic valve regurgitation is not visualized. Mild aortic valve sclerosis is present, with no evidence of aortic valve stenosis. Aortic valve mean gradient measures 5.0 mmHg. Aortic valve peak gradient measures 9.1 mmHg. Aortic valve area, by VTI measures 2.47 cm. Pulmonic Valve: The pulmonic valve was normal in structure. Pulmonic valve regurgitation is not visualized. No evidence of pulmonic stenosis. Aorta: The aortic root is normal in size and structure. Venous: The inferior vena cava is normal in size with greater than 50% respiratory variability, suggesting right atrial pressure of 3 mmHg. IAS/Shunts: No atrial level shunt detected by color flow Doppler.  LEFT VENTRICLE PLAX 2D LVIDd:          3.50 cm  Diastology LVIDs:         2.60 cm  LV e' medial:    7.29 cm/s LV PW:         1.00 cm  LV E/e' medial:  10.3 LV IVS:        0.70 cm  LV e' lateral:   8.92 cm/s LVOT diam:     1.80 cm  LV E/e' lateral: 8.4 LV SV:         67 LV SV Index:   40       2D Longitudinal Strain LVOT Area:     2.54 cm 2D Strain GLS Avg:     -22.5 %  RIGHT VENTRICLE RV Basal diam:  3.00 cm TAPSE (M-mode): 2.4 cm LEFT ATRIUM             Index       RIGHT ATRIUM          Index LA diam:        3.00 cm 1.77 cm/m  RA Area:     7.18 cm LA Vol (A2C):   21.4 ml 12.63 ml/m RA Volume:   11.30 ml 6.67 ml/m LA Vol (A4C):   20.9 ml 12.33 ml/m LA Biplane Vol: 21.3 ml 12.57 ml/m  AORTIC VALVE                    PULMONIC VALVE AV Area (Vmax):    2.01 cm     PV Vmax:       0.90 m/s AV Area (Vmean):   2.20 cm     PV Vmean:      64.000 cm/s AV Area (VTI):     2.47 cm     PV VTI:        0.164 m AV Vmax:           151.00 cm/s  PV Peak grad:  3.2 mmHg AV Vmean:          105.000 cm/s PV Mean grad:  2.0 mmHg AV VTI:            0.273 m AV Peak Grad:      9.1 mmHg AV Mean Grad:      5.0 mmHg LVOT Vmax:         119.00 cm/s LVOT Vmean:        90.900 cm/s LVOT VTI:          0.265 m LVOT/AV VTI ratio: 0.97  AORTA Ao Root diam: 2.30 cm MITRAL VALVE MV Area (PHT): 3.01 cm     SHUNTS MV Area VTI:   2.69 cm  Systemic VTI:  0.26 m MV Peak grad:  4.0 mmHg     Systemic Diam: 1.80 cm MV Mean grad:  2.0 mmHg MV Vmax:       1.00 m/s MV Vmean:      61.8 cm/s MV Decel Time: 252 msec MV E velocity: 75.00 cm/s MV A velocity: 101.00 cm/s MV E/A ratio:  0.74 Ida Rogue MD Electronically signed by Ida Rogue MD Signature Date/Time: 06/13/2021/5:34:59 PM    Final    MM CLIP PLACEMENT LEFT  Result Date: 05/07/2021 CLINICAL DATA:  Evaluate post biopsy marker clip placements following ultrasound-guided core needle biopsy of 2 left breast masses. EXAM: DIAGNOSTIC LEFT MAMMOGRAM POST ULTRASOUND BIOPSY COMPARISON:  Previous exam(s). FINDINGS: Mammographic  images were obtained following ultrasound guided biopsy of 2 left breast masses. The vision biopsy clip lies within the larger mass, surrounded by pleomorphic calcifications, and the cylinder/mini Cork biopsy clip lies in the posterior mass. IMPRESSION: Appropriate positioning of the venous and mini Cork shaped biopsy marking clip at the site of biopsy in the upper left breast as detailed. Final Assessment: Post Procedure Mammograms for Marker Placement Electronically Signed   By: Lajean Manes M.D.   On: 05/07/2021 09:12  [place] LT BREAST BX W LOC DEV 1ST LESION IMG BX SPEC US GUIDE  Addendum Date: 05/10/2021   ADDENDUM REPORT: 05/10/2021 12:41 ADDENDUM: PATHOLOGY revealed: Site A. BREAST, LEFT, 12 O'CLOCK, 5 CM FROM NIPPLE; ULTRASOUND-GUIDED CORE BIOPSY: - INVASIVE MAMMARY CARCINOMA, NO SPECIAL TYPE. Size of invasive carcinoma: 2.5 mm in this sample. Histologic grade of invasive carcinoma: Grade 3. Ductal carcinoma in situ (DCIS): Present, high-grade, associated with calcifications. Lymphovascular invasion: Not identified Pathology results are CONCORDANT with imaging findings, per Dr. Lajean Manes. PATHOLOGY revealed: Site B. BREAST, LEFT, 11 O'CLOCK, 10 CM FROM NIPPLE; ULTRASOUND-GUIDED CORE BIOPSY: - INVASIVE MAMMARY CARCINOMA, NO SPECIAL TYPE. Size of invasive carcinoma: 8 mm in this sample. Histologic grade of invasive carcinoma: Grade 2. DCIS: Not identified. Lymphovascular invasion: Not identified. Comment: The invasive carcinomas in A and B differ in grade; A is grade 3 due to higher mitotic rate. There are also differences in growth patterns with a more nested and solid pattern in B. Pathology results are CONCORDANT with imaging findings, per Dr. Lajean Manes. Pathology results and recommendations below were discussed with patient by telephone on 05/08/2021. Patient reported biopsy site within normal limits with slight tenderness at the site. Post biopsy care instructions were reviewed, questions were  answered and my direct phone number was provided to patient. Patient was instructed to call [facility] if any concerns or questions arise related to the biopsy. Recommendations: 1. Surgical consultation for both sites (A and B). Request for surgical consultation relayed to Al Pimple RN and Tanya Nones RN at [facility] by Electa Sniff RN 05/08/2021. 2. Consider bilateral breast MRI due to high grade DCIS and to evaluate extent of breast disease given additional masses identified on diagnostic mammogram. Pathology results reported by Electa Sniff RN on 05/10/2021. Electronically Signed   By: Lajean Manes M.D.   On: 05/10/2021 12:41   Result Date: 05/10/2021 CLINICAL DATA:  Patient presents for biopsy of 2 left breast masses, part of a conglomerate of masses spanning over 8 cm in the left breast, associated with pleomorphic calcifications. EXAM: ULTRASOUND GUIDED LEFT BREAST CORE NEEDLE BIOPSY: 2 MASSES BIOPSIED COMPARISON:  Previous exam(s). PROCEDURE: I met with the patient and we discussed the procedure of ultrasound-guided biopsy, including benefits and alternatives.  We discussed the high likelihood of a successful procedure. We discussed the risks of the procedure, including infection, bleeding, tissue injury, clip migration, and inadequate sampling. Informed written consent was given. The usual time-out protocol was performed immediately prior to the procedure. Lesion #1: 12 o'clock, 5 cmfn. Lesion quadrant: Upper outer quadrant Using sterile technique and 1% Lidocaine as local anesthetic, under direct ultrasound visualization, a 12 gauge spring-loaded device was used to perform biopsy of the 4.3 cm mass using a inferomedial approach. At the conclusion of the procedure a vision tissue marker clip was deployed into the biopsy cavity. Lesion #2: 11 o'clock, 10 cmfn. Lesion quadrant: Upper outer quadrant Using sterile technique and 1% Lidocaine as local anesthetic, under direct  ultrasound visualization, a 14 gauge spring-loaded device was used to perform biopsy of the 9 mm mass using a inferior approach. At the conclusion of the procedure mini Cork shaped tissue marker clip was deployed into the biopsy cavity. Follow up 2 view mammogram was performed and dictated separately. IMPRESSION: Ultrasound guided biopsy of 2 left breast masses. No apparent complications. Electronically Signed: By: Lajean Manes M.D. On: 05/07/2021 09:10  [place] LT BREAST BX W LOC DEV EA ADD LESION IMG BX SPEC US GUIDE  Addendum Date: 05/10/2021   ADDENDUM REPORT: 05/10/2021 12:41 ADDENDUM: PATHOLOGY revealed: Site A. BREAST, LEFT, 12 O'CLOCK, 5 CM FROM NIPPLE; ULTRASOUND-GUIDED CORE BIOPSY: - INVASIVE MAMMARY CARCINOMA, NO SPECIAL TYPE. Size of invasive carcinoma: 2.5 mm in this sample. Histologic grade of invasive carcinoma: Grade 3. Ductal carcinoma in situ (DCIS): Present, high-grade, associated with calcifications. Lymphovascular invasion: Not identified Pathology results are CONCORDANT with imaging findings, per Dr. Lajean Manes. PATHOLOGY revealed: Site B. BREAST, LEFT, 11 O'CLOCK, 10 CM FROM NIPPLE; ULTRASOUND-GUIDED CORE BIOPSY: - INVASIVE MAMMARY CARCINOMA, NO SPECIAL TYPE. Size of invasive carcinoma: 8 mm in this sample. Histologic grade of invasive carcinoma: Grade 2. DCIS: Not identified. Lymphovascular invasion: Not identified. Comment: The invasive carcinomas in A and B differ in grade; A is grade 3 due to higher mitotic rate. There are also differences in growth patterns with a more nested and solid pattern in B. Pathology results are CONCORDANT with imaging findings, per Dr. Lajean Manes. Pathology results and recommendations below were discussed with patient by telephone on 05/08/2021. Patient reported biopsy site within normal limits with slight tenderness at the site. Post biopsy care instructions were reviewed, questions were answered and my direct phone number was provided to patient. Patient  was instructed to call [facility] if any concerns or questions arise related to the biopsy. Recommendations: 1. Surgical consultation for both sites (A and B). Request for surgical consultation relayed to Al Pimple RN and Tanya Nones RN at [facility] by Electa Sniff RN 05/08/2021. 2. Consider bilateral breast MRI due to high grade DCIS and to evaluate extent of breast disease given additional masses identified on diagnostic mammogram. Pathology results reported by Electa Sniff RN on 05/10/2021. Electronically Signed   By: Lajean Manes M.D.   On: 05/10/2021 12:41   Result Date: 05/10/2021 CLINICAL DATA:  Patient presents for biopsy of 2 left breast masses, part of a conglomerate of masses spanning over 8 cm in the left breast, associated with pleomorphic calcifications. EXAM: ULTRASOUND GUIDED LEFT BREAST CORE NEEDLE BIOPSY: 2 MASSES BIOPSIED COMPARISON:  Previous exam(s). PROCEDURE: I met with the patient and we discussed the procedure of ultrasound-guided biopsy, including benefits and alternatives. We discussed the high likelihood of a successful procedure. We discussed  the risks of the procedure, including infection, bleeding, tissue injury, clip migration, and inadequate sampling. Informed written consent was given. The usual time-out protocol was performed immediately prior to the procedure. Lesion #1: 12 o'clock, 5 cmfn. Lesion quadrant: Upper outer quadrant Using sterile technique and 1% Lidocaine as local anesthetic, under direct ultrasound visualization, a 12 gauge spring-loaded device was used to perform biopsy of the 4.3 cm mass using a inferomedial approach. At the conclusion of the procedure a vision tissue marker clip was deployed into the biopsy cavity. Lesion #2: 11 o'clock, 10 cmfn. Lesion quadrant: Upper outer quadrant Using sterile technique and 1% Lidocaine as local anesthetic, under direct ultrasound visualization, a 14 gauge spring-loaded device was used to  perform biopsy of the 9 mm mass using a inferior approach. At the conclusion of the procedure mini Cork shaped tissue marker clip was deployed into the biopsy cavity. Follow up 2 view mammogram was performed and dictated separately. IMPRESSION: Ultrasound guided biopsy of 2 left breast masses. No apparent complications. Electronically Signed: By: Lajean Manes M.D. On: 05/07/2021 09:10      ASSESSMENT & PLAN:  1. Malignant neoplasm of left breast in female, estrogen receptor positive, unspecified site of breast ([place])   2. Chemotherapy induced nausea and vomiting   3. Thrombocytopenia ([place])   4. Encounter for antineoplastic chemotherapy   Cancer Staging Invasive carcinoma of breast ([place]) Staging form: Breast, AJCC 8th Edition - Clinical stage from 05/16/2021: Stage IIB (cT3, cN0, cM0, G3, ER+, PR+, HER2-) - Signed by [name], MD on 06/03/2021  #Left breast invasive carcinoma, multiple sites.  ER 90%, PR 11-50% positive, HER2 negative by IHC cT3 N0 On neoadjuvant chemotherapy Tolerated 2 cycle of ddAC.  Labs are reviewed and discussed with patient Proceed with cycle 3 ddAC Onpro Neulasta for GCSF support.   #Chemotherapy-induced nausea vomiting, continue home antiemetics as needed. # chemotherapy induced thrombocytopenia.  Resolved.  Follow up in 2 weeks for cycle 4 ddAC.   All questions were answered. The patient knows to call the clinic with any problems questions or concerns.  cc McLean-Scocuzza, Olam Idler, MD, PhD Hematology Oncology [facility] at [facility] Pager- [phone] 08/02/2021

## 2021-08-02 NOTE — Patient Instructions (Signed)
[facility]  Discharge Instructions: Thank you for choosing [facility] to provide your oncology and hematology care.  If you have a lab appointment with the [facility], please go directly to the [facility] and check in at the registration area.  Wear comfortable clothing and clothing appropriate for easy access to any Portacath or PICC line.   We strive to give you quality time with your provider. You may need to reschedule your appointment if you arrive late (15 or more minutes).  Arriving late affects you and other patients whose appointments are after yours.  Also, if you miss three or more appointments without notifying the office, you may be dismissed from the clinic at the provider's discretion.      For prescription refill requests, have your pharmacy contact our office and allow 72 hours for refills to be completed.    Today you received the following chemotherapy and/or immunotherapy agents : A/C .      To help prevent nausea and vomiting after your treatment, we encourage you to take your nausea medication as directed.  BELOW ARE SYMPTOMS THAT SHOULD BE REPORTED IMMEDIATELY: *FEVER GREATER THAN 100.4 F (38 C) OR HIGHER *CHILLS OR SWEATING *NAUSEA AND VOMITING THAT IS NOT CONTROLLED WITH YOUR NAUSEA MEDICATION *UNUSUAL SHORTNESS OF BREATH *UNUSUAL BRUISING OR BLEEDING *URINARY PROBLEMS (pain or burning when urinating, or frequent urination) *BOWEL PROBLEMS (unusual diarrhea, constipation, pain near the anus) TENDERNESS IN MOUTH AND THROAT WITH OR WITHOUT PRESENCE OF ULCERS (sore throat, sores in mouth, or a toothache) UNUSUAL RASH, SWELLING OR PAIN  UNUSUAL VAGINAL DISCHARGE OR ITCHING   Items with * indicate a potential emergency and should be followed up as soon as possible or go to the Emergency Department if any problems should occur.  Please show the CHEMOTHERAPY ALERT CARD or IMMUNOTHERAPY ALERT CARD at check-in to  the Emergency Department and triage nurse.  Should you have questions after your visit or need to cancel or reschedule your appointment, please contact [facility]  [phone] and follow the prompts.  Office hours are 8:00 a.m. to 4:30 p.m. Monday - Friday. Please note that voicemails left after 4:00 p.m. may not be returned until the following business day.  We are closed weekends and major holidays. You have access to a nurse at all times for urgent questions. Please call the main number to the clinic [phone] and follow the prompts.  For any non-urgent questions, you may also contact your provider using MyChart. We now offer e-Visits for anyone 25 and older to request care online for non-urgent symptoms. For details visit [email or web address].   Also download the MyChart app! Go to the app store, search "MyChart", open the app, select [facility], and log in with your MyChart username and password.  Due to Covid, a mask is required upon entering the hospital/clinic. If you do not have a mask, one will be given to you upon arrival. For doctor visits, patients may have 1 support person aged 55 or older with them. For treatment visits, patients cannot have anyone with them due to current Covid guidelines and our immunocompromised population.

## 2021-08-02 NOTE — Progress Notes (Signed)
Nutrition Assessment   Reason for Assessment:  Patient identified on Malnutrition Screening report for weight loss, poor appetite   ASSESSMENT:  70 year old female with left breast cancer.  Past medical history of stroke, CAD, DM, HTN.  Patient receiving neoadjuvant chemotherapy.   Met with patient during infusion.  Patient reports that her appetite is good some days and not others.  Denies nausea.  Says that she has tried boost shakes but does not drink them every day.  Breakfast husband usually gets her a grilled chicken biscuit.  He cooks lunch maybe meatloaf with green beans.  She eats evening meal maybe 3-4 times per week of maybe a burger and fries.     Medications: jardiance, metformin, zofran, compazine, Vit b 12 and D, KCL   Labs: reviewed   Anthropometrics:   Height: 61 inches Weight: 140 lb today 06/06/21 155 lb BMI: 26  10% weight loss in the last 2 months, significant  Estimated Energy Needs  Kcals: 1600-1900 Protein: 80-95 g Fluid: 1.6 L   NUTRITION DIAGNOSIS: Inadequate oral intake related to cancer and cancer related treatment side effects as evidenced by 10% weight loss in the last 2 months and decreased intake   INTERVENTION:  Recommend adding boost plus shake daily. Coupons given Encouraged small frequent meals. Discussed options for snacks/mini meals and handout given on high calorie snacks. Contact information given   MONITORING, EVALUATION, GOAL: weight trends, intake   Next Visit: Friday, Sept 23 during infusion  [place] B. Zenia Resides, [place], [place] Registered Dietitian [phone] (mobile)

## 2021-08-02 NOTE — Progress Notes (Signed)
Pt here for follow up. No new concerns voiced.   

## 2021-08-16 ENCOUNTER — Telehealth: Payer: Self-pay | Admitting: Oncology

## 2021-08-16 ENCOUNTER — Inpatient Hospital Stay: Payer: PPO

## 2021-08-16 ENCOUNTER — Encounter: Payer: Self-pay | Admitting: Oncology

## 2021-08-16 ENCOUNTER — Inpatient Hospital Stay (HOSPITAL_BASED_OUTPATIENT_CLINIC_OR_DEPARTMENT_OTHER): Payer: PPO | Admitting: Oncology

## 2021-08-16 VITALS — BP 130/77 | HR 91 | Temp 98.5°F | Resp 17 | Wt 134.0 lb

## 2021-08-16 DIAGNOSIS — R112 Nausea with vomiting, unspecified: Secondary | ICD-10-CM

## 2021-08-16 DIAGNOSIS — C50912 Malignant neoplasm of unspecified site of left female breast: Secondary | ICD-10-CM | POA: Diagnosis not present

## 2021-08-16 DIAGNOSIS — T451X5A Adverse effect of antineoplastic and immunosuppressive drugs, initial encounter: Secondary | ICD-10-CM

## 2021-08-16 DIAGNOSIS — Z5111 Encounter for antineoplastic chemotherapy: Secondary | ICD-10-CM

## 2021-08-16 DIAGNOSIS — Z170 Estrogen receptor positive status [ER+]: Secondary | ICD-10-CM

## 2021-08-16 LAB — COMPREHENSIVE METABOLIC PANEL
ALT: 13 U/L (ref 0–44)
AST: 12 U/L — ABNORMAL LOW (ref 15–41)
Albumin: 3.9 g/dL (ref 3.5–5.0)
Alkaline Phosphatase: 99 U/L (ref 38–126)
Anion gap: 10 (ref 5–15)
BUN: 18 mg/dL (ref 8–23)
CO2: 18 mmol/L — ABNORMAL LOW (ref 22–32)
Calcium: 9.5 mg/dL (ref 8.9–10.3)
Chloride: 112 mmol/L — ABNORMAL HIGH (ref 98–111)
Creatinine, Ser: 0.75 mg/dL (ref 0.44–1.00)
GFR, Estimated: >60 mL/min (ref >60)
Glucose, Bld: 131 mg/dL — ABNORMAL HIGH (ref 70–99)
Potassium: 3.6 mmol/L (ref 3.5–5.1)
Sodium: 140 mmol/L (ref 135–145)
Total Bilirubin: 0.5 mg/dL (ref 0.3–1.2)
Total Protein: 6.7 g/dL (ref 6.5–8.1)

## 2021-08-16 LAB — CBC WITH DIFFERENTIAL/PLATELET
Abs Immature Granulocytes: 3.91 10*3/uL — ABNORMAL HIGH (ref 0.00–0.07)
Basophils Absolute: 0.1 10*3/uL (ref 0.0–0.1)
Basophils Relative: 0 %
Eosinophils Absolute: 0 10*3/uL (ref 0.0–0.5)
Eosinophils Relative: 0 %
HCT: 30.4 % — ABNORMAL LOW (ref 36.0–46.0)
Hemoglobin: 10 g/dL — ABNORMAL LOW (ref 12.0–15.0)
Immature Granulocytes: 13 %
Lymphocytes Relative: 6 %
Lymphs Abs: 1.9 10*3/uL (ref 0.7–4.0)
MCH: 31.1 pg (ref 26.0–34.0)
MCHC: 32.9 g/dL (ref 30.0–36.0)
MCV: 94.4 fL (ref 80.0–100.0)
Monocytes Absolute: 2.8 10*3/uL — ABNORMAL HIGH (ref 0.1–1.0)
Monocytes Relative: 9 %
Neutro Abs: 21.6 10*3/uL — ABNORMAL HIGH (ref 1.7–7.7)
Neutrophils Relative %: 72 %
Platelets: 160 10*3/uL (ref 150–400)
RBC: 3.22 MIL/uL — ABNORMAL LOW (ref 3.87–5.11)
RDW: 14.7 % (ref 11.5–15.5)
Smear Review: NORMAL
WBC: 30.2 10*3/uL — ABNORMAL HIGH (ref 4.0–10.5)
nRBC: 0.5 % — ABNORMAL HIGH (ref 0.0–0.2)

## 2021-08-16 MED ORDER — DOXORUBICIN HCL CHEMO IV INJECTION 2 MG/ML
60.0000 mg/m2 | Freq: Once | INTRAVENOUS | Status: AC
  Administered 2021-08-16: 100 mg via INTRAVENOUS
  Filled 2021-08-16: qty 50

## 2021-08-16 MED ORDER — SODIUM CHLORIDE 0.9 % IV SOLN
10.0000 mg | Freq: Once | INTRAVENOUS | Status: AC
  Administered 2021-08-16: 10 mg via INTRAVENOUS
  Filled 2021-08-16: qty 10

## 2021-08-16 MED ORDER — PALONOSETRON HCL INJECTION 0.25 MG/5ML
0.2500 mg | Freq: Once | INTRAVENOUS | Status: AC
  Administered 2021-08-16: 0.25 mg via INTRAVENOUS
  Filled 2021-08-16: qty 5

## 2021-08-16 MED ORDER — SODIUM CHLORIDE 0.9 % IV SOLN
Freq: Once | INTRAVENOUS | Status: AC
  Filled 2021-08-16: qty 250

## 2021-08-16 MED ORDER — SODIUM CHLORIDE 0.9 % IV SOLN
150.0000 mg | Freq: Once | INTRAVENOUS | Status: AC
  Administered 2021-08-16: 150 mg via INTRAVENOUS
  Filled 2021-08-16: qty 150

## 2021-08-16 MED ORDER — PEGFILGRASTIM 6 MG/0.6ML ~~LOC~~ PSKT
6.0000 mg | PREFILLED_SYRINGE | Freq: Once | SUBCUTANEOUS | Status: AC
  Administered 2021-08-16: 6 mg via SUBCUTANEOUS
  Filled 2021-08-16: qty 0.6

## 2021-08-16 MED ORDER — SODIUM CHLORIDE 0.9 % IV SOLN
600.0000 mg/m2 | Freq: Once | INTRAVENOUS | Status: AC
  Administered 2021-08-16: 1000 mg via INTRAVENOUS
  Filled 2021-08-16: qty 50

## 2021-08-16 MED ORDER — HEPARIN SOD (PORK) LOCK FLUSH 100 UNIT/ML IV SOLN
500.0000 [IU] | Freq: Once | INTRAVENOUS | Status: AC | PRN
  Filled 2021-08-16: qty 5

## 2021-08-16 MED ORDER — HEPARIN SOD (PORK) LOCK FLUSH 100 UNIT/ML IV SOLN
INTRAVENOUS | Status: AC
  Administered 2021-08-16: 500 [IU]
  Filled 2021-08-16: qty 5

## 2021-08-16 NOTE — Progress Notes (Signed)
Hematology/Oncology Progress note [facility] Telephone:([phone] [phone]   Patient Care Team: McLean-Scocuzza, Nino Glow, MD as PCP - General (Internal Medicine) De Hollingshead, [place] as Pharmacist (Pharmacist) Theodore Demark, RN as Oncology Nurse Navigator  REFERRING PROVIDER: McLean-Scocuzza, Olivia Mackie *  CHIEF COMPLAINTS/REASON FOR VISIT:  multifocal left breast cancer  HISTORY OF PRESENTING ILLNESS:   Tammy Nunez is a  70 y.o.  female with [place] listed below was seen in consultation at the request of  McLean-Scocuzza, Olivia Mackie *  for evaluation of multifocal  04/24/2021, screening mammogram showed large asymmetry in the superior aspect of the breast with associated calcifications and possible masses and a subtle distortions.  No suspicious findings for malignancy right breast.  05/02/2021 left breast diagnostic mammogram and ultrasound showed multiple irregular mass in the left breast. Mammogram mass 1 irregular mass associated with distortion in the upper breast at middle depth, multiple adjacent and possible continuous irregular masses, conglomeration of masses measures at least 5.6 cm.  Associated pleomorphic calcifications extending at least 5 x 3.9 x 3.7 cm Mass 2, 1 cm mass in the upper breast at the middle to posterior depth. Mass 3, 5 mm irregular mass in the upper breast at posterior depth. In total, mass 1-mass 3 span at least 8.2 cm in anterior-posterior extent.  By ultrasound  mass 1 12:00 5 cm from nipple, 2.7 x 1.5 x 4.3 cm Mass 2, 12:00 12 cm from nipple, 0.9 x 0.9 x 0.5 cm Mass 3   12:00 14 cm from nipple, 0.4 x 0.4 x 0.4 cm-uses 2.4 cm superior to mass to Mass 4   11:00  No suspicious left axillary adenopathy.   05/07/2021 -Ultrasound-guided breast biopsy Breast left 12:00 mass 5 cm from nipple biopsy showed invasive mammary carcinoma, no special type, grade 3, high-grade DCIS present, no LVI, ER 90%, PR 11-50%, HER2  negative by IHC Breast left 11:00 mass 10 cm from nipple biopsy showed invasive mammary carcinoma, no special type, grade 2, no DCIS/LVI identified.ER 90%, PR 11-50%, HER2 negative by IHC  Patient is married has no children.  Denies any hormone replacement, oral contraceptive use, family history of breast cancer.  Denies any chest radiation. Patient has a history of stroke with chronic residual weakness of the left side.  She walks with a cane.  05/22/2021 MRI breast showed that nonmass like malignancy spanning over 8 cm, abuting anterior aspect of pectoralis muscle. Discussed with Dr.Cintron and we agree that she will need mastectomy. There is concern of possible positive margin. I recommend neoadjuvant chemotherapy  # medi port placed on 06/06/2021. 06/13/2021 pre chemotherapy Echo. LVEF 60-65%.  Normal global longitudinal strain. Grade 1 diastolic dysfunction.   # 05/22/2021 MRI breast showed  Biopsy-proven malignancy within the UPPER LEFT breast spanning a distance of 8.3 x 4.2 x 4 cm nonmass like enhancement abuts the anterior aspect of the LEFT pectoralis muscle without gross invasion. Oncotype Dx 27. Discussed with Dr.Cintron, there is concern of positive margin.   INTERVAL HISTORY Tammy Nunez is a 70 y.o. female who has above history reviewed by me today presents for follow up visit for chemotherapy of breast cancer Patient was accompanied by her husband. She feels well. She had loose bowel movements and took imodium, symptoms resolved within a day.  No nausea vomiting, fever, chills.     Review of Systems  Constitutional:  Negative for appetite change, chills, fatigue and fever.  HENT:   Negative for hearing loss and voice change.  Eyes:  Negative for eye problems.  Respiratory:  Negative for chest tightness and cough.   Cardiovascular:  Negative for chest pain.  Gastrointestinal:  Negative for abdominal distention, abdominal pain, blood in stool and nausea.  Endocrine:  Negative for hot flashes.  Genitourinary:  Negative for difficulty urinating and frequency.   Musculoskeletal:  Negative for arthralgias.  Skin:  Negative for itching and rash.  Neurological:  Negative for extremity weakness.  Hematological:  Negative for adenopathy. Does not bruise/bleed easily.  Psychiatric/Behavioral:  Negative for confusion.    MEDICAL HISTORY:  Past Medical History:  Diagnosis Date   Breast cancer in female [facility]) 05/08/2021   Chronic left shoulder pain    Coronary artery disease    Diabetes mellitus without complication (HCC)    Dysrhythmia    Hypertension    Paralysis (HCC)    weakness left upper amd lower extremity   PONV (postoperative nausea and vomiting)    Stroke [facility])     SURGICAL HISTORY: Past Surgical History:  Procedure Laterality Date   BREAST BIOPSY Left 05/07/2021   12:00 5cmfn vision clip path pending   BREAST BIOPSY Left 05/07/2021   11:00 10 cmfn mini cork clip path pending   CAROTID PTA/STENT INTERVENTION Left 07/20/2019   Procedure: CAROTID PTA/STENT INTERVENTION;  Surgeon: Katha Cabal, MD;  Location: [facility];  Service: Cardiovascular;  Laterality: Left;   ECTOPIC PREGNANCY SURGERY     PORTACATH PLACEMENT N/A 06/06/2021   Procedure: INSERTION PORT-A-CATH;  Surgeon: Herbert Pun, MD;  Location: [facility] ORS;  Service: General;  Laterality: N/A;    SOCIAL HISTORY: Social History   Socioeconomic History   Marital status: Married    Spouse name: Not on file   Number of children: Not on file   Years of education: Not on file   Highest education level: Not on file  Occupational History   Not on file  Tobacco Use   Smoking status: Former    Packs/day: 0.50    Years: 15.00    Pack years: 7.50    Types: Cigarettes    Quit date: 04/11/2019    Years since quitting: 2.3   Smokeless tobacco: Never  Vaping Use   Vaping Use: Never used  Substance and Sexual Activity   Alcohol use: Never   Drug use: Never    Sexual activity: Not Currently  Other Topics Concern   Not on file  Social History Narrative   No kids    Married with husband    Used to work [employer] broadcast assistant    Social Determinants of Radio broadcast assistant Strain: Low Risk    Difficulty of Paying Living Expenses: Not hard at all  Food Insecurity: Not on file  Transportation Needs: Not on file  Physical Activity: Sufficiently Active   Days of Exercise per Week: 7 days   Minutes of Exercise per Session: 30 min  Stress: Not on file  Social Connections: Not on file  Intimate Partner Violence: Not on file    FAMILY HISTORY: Family History  Problem Relation Age of Onset   Diabetes type II Mother    Hypertension Father    Heart attack Father    Diabetes type II Brother    Breast cancer Neg Hx     ALLERGIES:  has No Known Allergies.  MEDICATIONS:  Current Outpatient Medications  Medication Sig Dispense Refill   amLODipine (NORVASC) 10 MG tablet Take 1 tablet by mouth once daily 90 tablet 0  aspirin EC 81 MG EC tablet Take 1 tablet (81 mg total) by mouth daily.     atorvastatin (LIPITOR) 80 MG tablet Take 1 tablet (80 mg total) by mouth daily. 90 tablet 3   blood glucose meter kit and supplies 1 each by Other route 4 (four) times daily. Dispense based on patient and insurance preference. Four times daily as directed. (FOR ICD-10 E10.9, E11.9). 1 each 0   carvedilol (COREG) 25 MG tablet Take 1 tablet (25 mg total) by mouth 2 (two) times daily with a meal. 180 tablet 3   clopidogrel (PLAVIX) 75 MG tablet Take 1 tablet (75 mg total) by mouth daily. Further refills [facility] neurology 90 tablet 3   Continuous Blood Gluc Sensor (FREESTYLE LIBRE 2 SENSOR) MISC Use to check glucose at least every 8 hours 6 each 3   dexamethasone (DECADRON) 4 MG tablet Take 2 tablets (8 mg total) by mouth daily. Take daily for 3 days after chemo. Take with food. 30 tablet 1   empagliflozin (JARDIANCE) 25 MG TABS tablet Take 1 tablet (25  mg total) by mouth daily. 90 tablet 3   lidocaine-prilocaine (EMLA) cream Apply to affected area once 30 g 3   lisinopril (ZESTRIL) 40 MG tablet Take 1 tablet (40 mg total) by mouth daily. 90 tablet 3   loratadine (CLARITIN) 10 MG tablet Take 10 mg by mouth daily.     metFORMIN (GLUCOPHAGE XR) 500 MG 24 hr tablet Take 2 tablets (1,000 mg total) by mouth daily with breakfast. (Patient taking differently: Take 500 mg by mouth 2 (two) times daily.) 180 tablet 3   ondansetron (ZOFRAN) 8 MG tablet Take 1 tablet (8 mg total) by mouth 2 (two) times daily as needed. Start on the third day after chemotherapy. 30 tablet 1   potassium chloride SA (KLOR-CON M20) 20 MEQ tablet Take 1 tablet (20 mEq total) by mouth daily. 90 tablet 3   prochlorperazine (COMPAZINE) 10 MG tablet Take 1 tablet (10 mg total) by mouth every 6 (six) hours as needed (Nausea or vomiting). 30 tablet 1   vitamin B-12 (CYANOCOBALAMIN) 1000 MCG tablet Take 1 tablet (1,000 mcg total) by mouth daily. 30 tablet 1   Vitamin D, Cholecalciferol, 50 MCG (2000 UT) CAPS Take 2,000 Units by mouth.     No current facility-administered medications for this visit.   Facility-Administered Medications Ordered in Other Visits  Medication Dose Route Frequency Provider Last Rate Last Admin   heparin lock flush 100 UNIT/ML injection              PHYSICAL EXAMINATION: ECOG PERFORMANCE STATUS: 1 - Symptomatic but completely ambulatory Vitals:   08/16/21 0836  BP: 130/77  Pulse: 91  Resp: 17  Temp: 98.5 F (36.9 C)  SpO2: 100%   Filed Weights   08/16/21 0836  Weight: 134 lb (60.8 kg)    Physical Exam Constitutional:      General: She is not in acute distress. HENT:     Head: Normocephalic and atraumatic.  Eyes:     General: No scleral icterus. Cardiovascular:     Rate and Rhythm: Normal rate and regular rhythm.     Heart sounds: Normal heart sounds.  Pulmonary:     Effort: Pulmonary effort is normal. No respiratory distress.      Breath sounds: No wheezing.  Abdominal:     General: Bowel sounds are normal. There is no distension.     Palpations: Abdomen is soft.  Musculoskeletal:  General: No deformity. Normal range of motion.     Cervical back: Normal range of motion and neck supple.  Skin:    General: Skin is warm and dry.     Findings: No erythema or rash.  Neurological:     Mental Status: She is alert and oriented to person, place, and time. Mental status is at baseline.     Comments: Chronic left upper and lower extremity weakness.  Psychiatric:        Mood and Affect: Mood normal.   Left breast mass has decreasing size     LABORATORY DATA:  I have reviewed the data as listed Lab Results  Component Value Date   WBC 14.0 (H) 08/02/2021   HGB 10.8 (L) 08/02/2021   HCT 33.3 (L) 08/02/2021   MCV 95.4 08/02/2021   PLT 230 08/02/2021   Recent Labs    07/05/21 0814 07/12/21 1025 08/02/21 0823  NA 140 139 140  K 3.8 3.8 4.7  CL 108 109 111  CO2 23 22 20*  GLUCOSE 170* 106* 113*  BUN 17 20 28*  CREATININE 0.92 0.97 0.87  CALCIUM 10.0 9.0 9.2  GFRNONAA >60 >60 >60  PROT 6.6 7.2 7.4  ALBUMIN 3.9 4.0 4.1  AST 18 16 13*  ALT 18 21 15   ALKPHOS 78 70 77  BILITOT 0.5 0.5 0.2*    Iron/TIBC/Ferritin/ %Sat    Component Value Date/Time   IRON 113 12/08/2019 1015   TIBC 317 12/08/2019 1015   FERRITIN 67 12/08/2019 1015   IRONPCTSAT 36 12/08/2019 1015       RADIOGRAPHIC STUDIES: I have personally reviewed the radiological images as listed and agreed with the findings in the report. MR BREAST BILATERAL W WO CONTRAST INC CAD  Result Date: 05/22/2021 CLINICAL DATA:  70 year old female for staging of newly diagnosed LEFT breast cancer. LABS:  None performed today EXAM: BILATERAL BREAST MRI WITH AND WITHOUT CONTRAST TECHNIQUE: Multiplanar, multisequence MR images of both breasts were obtained prior to and following the intravenous administration of 6 ml of Gadavist Three-dimensional MR  images were rendered by post-processing of the original MR data on an independent workstation. The three-dimensional MR images were interpreted, and findings are reported in the following complete MRI report for this study. Three dimensional images were evaluated at the independent interpreting workstation using the DynaCAD thin client. COMPARISON:  Previous exam(s). FINDINGS: Breast composition: c. Heterogeneous fibroglandular tissue. Background parenchymal enhancement: Minimal Right breast: No mass or abnormal enhancement. Left breast: A 8.3 x 4.2 x 4 cm (AP x transverse x CC) area of non masslike enhancement and scattered enhancing masses is identified within the UPPER LEFT breast, from middle to posterior depth, compatible with biopsy-proven malignancy. Artifacts from biopsy clips within the mid and anterior aspects of this abnormal enhancement are noted. Non masslike enhancement extends to the LEFT pectoralis muscle without gross muscle invasion. Lymph nodes: No abnormally enlarged or thickened lymph nodes are noted. Ancillary findings:  None. IMPRESSION: 1. Biopsy-proven malignancy within the UPPER LEFT breast spanning a distance of 8.3 x 4.2 x 4 cm. Nonmasslike enhancement abuts the anterior aspect of the LEFT pectoralis muscle without gross invasion. 2. No MR evidence of RIGHT breast malignancy. No abnormal appearing lymph nodes. RECOMMENDATION: Treatment plan BI-RADS CATEGORY  6: Known biopsy-proven malignancy. Electronically Signed   By: Margarette Canada M.D.   On: 05/22/2021 11:20  DG Chest Port 1 View  Result Date: 06/06/2021 CLINICAL DATA:  Post Port-A-Cath placement EXAM: PORTABLE CHEST 1 VIEW  COMPARISON:  Portable exam 1234 hours without priors for comparison FINDINGS: RIGHT jugular Port-A-Cath with tip projecting over SVC. Normal heart size, mediastinal contours, and pulmonary vascularity. Atherosclerotic calcification aorta. Lungs clear. No acute infiltrate, pleural effusion, or pneumothorax. Osseous  structures unremarkable. IMPRESSION: No pneumothorax following RIGHT jugular Port-A-Cath insertion. Aortic Atherosclerosis (ICD10-I70.0). Electronically Signed   By: Lavonia Dana M.D.   On: 06/06/2021 13:24   DG C-Arm 1-60 Min-No Report  Result Date: 06/06/2021 Fluoroscopy was utilized by the requesting physician.  No radiographic interpretation.   ECHOCARDIOGRAM COMPLETE  Result Date: 06/13/2021    ECHOCARDIOGRAM REPORT   Patient Name:   LARYN VENNING Date of Exam: 06/13/2021 Medical Rec #:  [id number]       Height:       61.0 in Accession #:    [id number]      Weight:       155.0 lb Date of Birth:  01-13-1951       BSA:          1.695 m Patient Age:    79 years        BP:           153/76 mmHg Patient Gender: F               HR:           76 bpm. Exam Location:  [facility] Procedure: 2D Echo, Color Doppler, Cardiac Doppler and Strain Analysis Indications:     Z09 Chemotherapy  History:         Patient has prior history of Echocardiogram examinations, most                  recent 06/07/2019. CAD, Stroke; Risk Factors:Diabetes and                  Hypertension. Invasive carcinoma of breast.  Sonographer:     Charmayne Sheer RDCS (AE) Referring Phys:  [id number] Coree Riester Diagnosing Phys: Ida Rogue MD  Sonographer Comments: Global longitudinal strain was attempted. IMPRESSIONS  1. Left ventricular ejection fraction, by estimation, is 60 to 65%. The left ventricle has normal function. The left ventricle has no regional wall motion abnormalities. Left ventricular diastolic parameters are consistent with Grade I diastolic dysfunction (impaired relaxation). The average left ventricular global longitudinal strain is -22.5 %. The global longitudinal strain is normal.  2. Right ventricular systolic function is normal. The right ventricular size is normal. Tricuspid regurgitation signal is inadequate for assessing PA pressure. FINDINGS  Left Ventricle: Left ventricular ejection fraction, by estimation, is 60 to 65%. The left  ventricle has normal function. The left ventricle has no regional wall motion abnormalities. The average left ventricular global longitudinal strain is -22.5 %. The global longitudinal strain is normal. The left ventricular internal cavity size was normal in size. There is no left ventricular hypertrophy. Left ventricular diastolic parameters are consistent with Grade I diastolic dysfunction (impaired relaxation). Right Ventricle: The right ventricular size is normal. No increase in right ventricular wall thickness. Right ventricular systolic function is normal. Tricuspid regurgitation signal is inadequate for assessing PA pressure. Left Atrium: Left atrial size was normal in size. Right Atrium: Right atrial size was normal in size. Pericardium: There is no evidence of pericardial effusion. Mitral Valve: The mitral valve is normal in structure. No evidence of mitral valve regurgitation. No evidence of mitral valve stenosis. MV peak gradient, 4.0 mmHg. The mean mitral valve gradient is 2.0 mmHg. Tricuspid Valve: The tricuspid valve is  normal in structure. Tricuspid valve regurgitation is not demonstrated. No evidence of tricuspid stenosis. Aortic Valve: The aortic valve was not well visualized. There is mild calcification of the aortic valve. Aortic valve regurgitation is not visualized. Mild aortic valve sclerosis is present, with no evidence of aortic valve stenosis. Aortic valve mean gradient measures 5.0 mmHg. Aortic valve peak gradient measures 9.1 mmHg. Aortic valve area, by VTI measures 2.47 cm. Pulmonic Valve: The pulmonic valve was normal in structure. Pulmonic valve regurgitation is not visualized. No evidence of pulmonic stenosis. Aorta: The aortic root is normal in size and structure. Venous: The inferior vena cava is normal in size with greater than 50% respiratory variability, suggesting right atrial pressure of 3 mmHg. IAS/Shunts: No atrial level shunt detected by color flow Doppler.  LEFT VENTRICLE  PLAX 2D LVIDd:         3.50 cm  Diastology LVIDs:         2.60 cm  LV e' medial:    7.29 cm/s LV PW:         1.00 cm  LV E/e' medial:  10.3 LV IVS:        0.70 cm  LV e' lateral:   8.92 cm/s LVOT diam:     1.80 cm  LV E/e' lateral: 8.4 LV SV:         67 LV SV Index:   40       2D Longitudinal Strain LVOT Area:     2.54 cm 2D Strain GLS Avg:     -22.5 %  RIGHT VENTRICLE RV Basal diam:  3.00 cm TAPSE (M-mode): 2.4 cm LEFT ATRIUM             Index       RIGHT ATRIUM          Index LA diam:        3.00 cm 1.77 cm/m  RA Area:     7.18 cm LA Vol (A2C):   21.4 ml 12.63 ml/m RA Volume:   11.30 ml 6.67 ml/m LA Vol (A4C):   20.9 ml 12.33 ml/m LA Biplane Vol: 21.3 ml 12.57 ml/m  AORTIC VALVE                    PULMONIC VALVE AV Area (Vmax):    2.01 cm     PV Vmax:       0.90 m/s AV Area (Vmean):   2.20 cm     PV Vmean:      64.000 cm/s AV Area (VTI):     2.47 cm     PV VTI:        0.164 m AV Vmax:           151.00 cm/s  PV Peak grad:  3.2 mmHg AV Vmean:          105.000 cm/s PV Mean grad:  2.0 mmHg AV VTI:            0.273 m AV Peak Grad:      9.1 mmHg AV Mean Grad:      5.0 mmHg LVOT Vmax:         119.00 cm/s LVOT Vmean:        90.900 cm/s LVOT VTI:          0.265 m LVOT/AV VTI ratio: 0.97  AORTA Ao Root diam: 2.30 cm MITRAL VALVE MV Area (PHT): 3.01 cm     SHUNTS MV Area VTI:   2.69 cm  Systemic VTI:  0.26 m MV Peak grad:  4.0 mmHg     Systemic Diam: 1.80 cm MV Mean grad:  2.0 mmHg MV Vmax:       1.00 m/s MV Vmean:      61.8 cm/s MV Decel Time: 252 msec MV E velocity: 75.00 cm/s MV A velocity: 101.00 cm/s MV E/A ratio:  0.74 Ida Rogue MD Electronically signed by Ida Rogue MD Signature Date/Time: 06/13/2021/5:34:59 PM    Final        ASSESSMENT & PLAN:  1. Malignant neoplasm of left breast in female, estrogen receptor positive, unspecified site of breast ([place])   2. Encounter for antineoplastic chemotherapy   3. Chemotherapy induced nausea and vomiting   Cancer Staging Invasive carcinoma of  breast ([place]) Staging form: Breast, AJCC 8th Edition - Clinical stage from 05/16/2021: Stage IIB (cT3, cN0, cM0, G3, ER+, PR+, HER2-) - Signed by [name], MD on 06/03/2021  #Left breast invasive carcinoma, multiple sites.  ER 90%, PR 11-50% positive, HER2 negative by IHC cT3 N0 On neoadjuvant chemotherapy Tolerated 3 cycle of ddAC.  Labs are reviewed and discussed with patient. Proceed with cycle 4 ddAC.  Onpro Neulasta for GCSF support.  Check MRI breast for evaluation of treatment response.   #Chemotherapy-induced nausea vomiting, continue home antiemetics as needed.  Follow up in 2 weeks for lab md Taxol.    All questions were answered. The patient knows to call the clinic with any problems questions or concerns.  cc McLean-Scocuzza, Olam Idler, MD, PhD 08/16/2021

## 2021-08-16 NOTE — Telephone Encounter (Signed)
Left Vm for Dollene Cleveland to contact [facility] to schedule patient for Breast MRI prior to Oct 7th per providers request.

## 2021-08-16 NOTE — Patient Instructions (Signed)
[place] ONCOLOGY  Discharge Instructions: Thank you for choosing [facility] to provide your oncology and hematology care.  If you have a lab appointment with the [facility], please go directly to the [facility] and check in at the registration area.  Wear comfortable clothing and clothing appropriate for easy access to any Portacath or PICC line.   We strive to give you quality time with your provider. You may need to reschedule your appointment if you arrive late (15 or more minutes).  Arriving late affects you and other patients whose appointments are after yours.  Also, if you miss three or more appointments without notifying the office, you may be dismissed from the clinic at the provider's discretion.      For prescription refill requests, have your pharmacy contact our office and allow 72 hours for refills to be completed.    Today you received the following chemotherapy and/or immunotherapy agents Adriamycin and Cytoxan       To help prevent nausea and vomiting after your treatment, we encourage you to take your nausea medication as directed.  BELOW ARE SYMPTOMS THAT SHOULD BE REPORTED IMMEDIATELY: *FEVER GREATER THAN 100.4 F (38 C) OR HIGHER *CHILLS OR SWEATING *NAUSEA AND VOMITING THAT IS NOT CONTROLLED WITH YOUR NAUSEA MEDICATION *UNUSUAL SHORTNESS OF BREATH *UNUSUAL BRUISING OR BLEEDING *URINARY PROBLEMS (pain or burning when urinating, or frequent urination) *BOWEL PROBLEMS (unusual diarrhea, constipation, pain near the anus) TENDERNESS IN MOUTH AND THROAT WITH OR WITHOUT PRESENCE OF ULCERS (sore throat, sores in mouth, or a toothache) UNUSUAL RASH, SWELLING OR PAIN  UNUSUAL VAGINAL DISCHARGE OR ITCHING   Items with * indicate a potential emergency and should be followed up as soon as possible or go to the Emergency Department if any problems should occur.  Please show the CHEMOTHERAPY ALERT CARD or IMMUNOTHERAPY ALERT CARD  at check-in to the Emergency Department and triage nurse.  Should you have questions after your visit or need to cancel or reschedule your appointment, please contact [facility]  [phone] and follow the prompts.  Office hours are 8:00 a.m. to 4:30 p.m. Monday - Friday. Please note that voicemails left after 4:00 p.m. may not be returned until the following business day.  We are closed weekends and major holidays. You have access to a nurse at all times for urgent questions. Please call the main number to the clinic [phone] and follow the prompts.  For any non-urgent questions, you may also contact your provider using MyChart. We now offer e-Visits for anyone 21 and older to request care online for non-urgent symptoms. For details visit [email or web address].   Also download the MyChart app! Go to the app store, search "MyChart", open the app, select , and log in with your MyChart username and password.  Due to Covid, a mask is required upon entering the hospital/clinic. If you do not have a mask, one will be given to you upon arrival. For doctor visits, patients may have 1 support person aged 61 or older with them. For treatment visits, patients cannot have anyone with them due to current Covid guidelines and our immunocompromised population.

## 2021-08-16 NOTE — Progress Notes (Signed)
Nutrition Follow-up:  Patient with left breast cancer.  Patient receiving neoadjuvant chemotherapy.   Met with patient during infusion.  Patient reports that appetite is good.  Reports sometimes food want go down and then eventually it does go down.  Says that she is not drinking boost shake daily.  Husband prepares meals.  Breakfast is usually grilled chicken biscuit or eggs and bacon.  Lunch is chicken or tuna salad sandwich or with crackers.  Supper is baked chicken and vegetables.      Medications: reviewed  Labs: glucose 131  Anthropometrics:   Weight 134 lb today  140 lb on 9/9 155 lb on 7/14   NUTRITION DIAGNOSIS: Inadequate oral intake continues   INTERVENTION:  Recommend boost shake at least daily. Coupons given Discussed foods to include in diet to prevent weight loss Encouraged chewing foods well, having liquid, moisture included for ease of swallowing.   Encouraged taking nausea medication    MONITORING, EVALUATION, GOAL: weight trends, intake   NEXT VISIT: ~4 weeks during treatment  Jazyah Butsch B. Zenia Resides, [place], [place] Registered Dietitian [phone] (mobile)

## 2021-08-16 NOTE — Progress Notes (Signed)
Patient here for oncology follow-up appointment, expresses no complaints or concerns at this time.    

## 2021-08-22 NOTE — Progress Notes (Signed)
Pharmacist Chemotherapy Monitoring - Initial Assessment    Anticipated start date: 08/30/21   The following has been reviewed per standard work regarding the patient's treatment regimen: The patient's diagnosis, treatment plan and drug doses, and organ/hematologic function Lab orders and baseline tests specific to treatment regimen  The treatment plan start date, drug sequencing, and pre-medications Prior authorization status  Patient's documented medication list, including drug-drug interaction screen and prescriptions for anti-emetics and supportive care specific to the treatment regimen The drug concentrations, fluid compatibility, administration routes, and timing of the medications to be used The patient's access for treatment and lifetime cumulative dose history, if applicable  The patient's medication allergies and previous infusion related reactions, if applicable   Changes made to treatment plan:  N/A  Follow up needed:  signing treatment plan   Adelina Mings, [place], 08/22/2021  10:59 AM 11

## 2021-08-26 ENCOUNTER — Other Ambulatory Visit: Payer: Self-pay

## 2021-08-26 ENCOUNTER — Ambulatory Visit
Admission: RE | Admit: 2021-08-26 | Discharge: 2021-08-26 | Disposition: A | Discharge status: Home / Self Care | Payer: PPO | Source: Ambulatory Visit | Attending: Oncology | Admitting: Oncology

## 2021-08-26 DIAGNOSIS — C50912 Malignant neoplasm of unspecified site of left female breast: Secondary | ICD-10-CM | POA: Insufficient documentation

## 2021-08-26 DIAGNOSIS — N6325 Unspecified lump in the left breast, overlapping quadrants: Secondary | ICD-10-CM | POA: Diagnosis not present

## 2021-08-26 DIAGNOSIS — Z170 Estrogen receptor positive status [ER+]: Secondary | ICD-10-CM | POA: Diagnosis not present

## 2021-08-26 MED ORDER — GADOBUTROL 1 MMOL/ML IV SOLN
6.0000 mL | Freq: Once | INTRAVENOUS | Status: AC | PRN
  Administered 2021-08-26: 6 mL via INTRAVENOUS

## 2021-08-30 ENCOUNTER — Inpatient Hospital Stay: Payer: PPO | Attending: Oncology

## 2021-08-30 ENCOUNTER — Inpatient Hospital Stay: Payer: PPO

## 2021-08-30 ENCOUNTER — Encounter: Payer: Self-pay | Admitting: Oncology

## 2021-08-30 ENCOUNTER — Inpatient Hospital Stay (HOSPITAL_BASED_OUTPATIENT_CLINIC_OR_DEPARTMENT_OTHER): Payer: PPO | Admitting: Oncology

## 2021-08-30 VITALS — BP 115/59 | HR 86 | Temp 99.0°F | Resp 17 | Wt 132.0 lb

## 2021-08-30 VITALS — BP 103/58 | HR 89 | Temp 96.8°F | Resp 16

## 2021-08-30 DIAGNOSIS — Z7984 Long term (current) use of oral hypoglycemic drugs: Secondary | ICD-10-CM | POA: Insufficient documentation

## 2021-08-30 DIAGNOSIS — E119 Type 2 diabetes mellitus without complications: Secondary | ICD-10-CM | POA: Insufficient documentation

## 2021-08-30 DIAGNOSIS — K521 Toxic gastroenteritis and colitis: Secondary | ICD-10-CM | POA: Diagnosis not present

## 2021-08-30 DIAGNOSIS — R112 Nausea with vomiting, unspecified: Secondary | ICD-10-CM

## 2021-08-30 DIAGNOSIS — I10 Essential (primary) hypertension: Secondary | ICD-10-CM | POA: Diagnosis not present

## 2021-08-30 DIAGNOSIS — Z5111 Encounter for antineoplastic chemotherapy: Secondary | ICD-10-CM | POA: Diagnosis not present

## 2021-08-30 DIAGNOSIS — Z170 Estrogen receptor positive status [ER+]: Secondary | ICD-10-CM | POA: Diagnosis not present

## 2021-08-30 DIAGNOSIS — I2510 Atherosclerotic heart disease of native coronary artery without angina pectoris: Secondary | ICD-10-CM | POA: Diagnosis not present

## 2021-08-30 DIAGNOSIS — T451X5A Adverse effect of antineoplastic and immunosuppressive drugs, initial encounter: Secondary | ICD-10-CM | POA: Insufficient documentation

## 2021-08-30 DIAGNOSIS — Z8673 Personal history of transient ischemic attack (TIA), and cerebral infarction without residual deficits: Secondary | ICD-10-CM | POA: Insufficient documentation

## 2021-08-30 DIAGNOSIS — Z79899 Other long term (current) drug therapy: Secondary | ICD-10-CM | POA: Insufficient documentation

## 2021-08-30 DIAGNOSIS — D696 Thrombocytopenia, unspecified: Secondary | ICD-10-CM | POA: Diagnosis not present

## 2021-08-30 DIAGNOSIS — D6481 Anemia due to antineoplastic chemotherapy: Secondary | ICD-10-CM | POA: Insufficient documentation

## 2021-08-30 DIAGNOSIS — Z87891 Personal history of nicotine dependence: Secondary | ICD-10-CM | POA: Diagnosis not present

## 2021-08-30 DIAGNOSIS — E876 Hypokalemia: Secondary | ICD-10-CM | POA: Insufficient documentation

## 2021-08-30 DIAGNOSIS — Z7982 Long term (current) use of aspirin: Secondary | ICD-10-CM | POA: Diagnosis not present

## 2021-08-30 DIAGNOSIS — C50912 Malignant neoplasm of unspecified site of left female breast: Secondary | ICD-10-CM

## 2021-08-30 DIAGNOSIS — Z803 Family history of malignant neoplasm of breast: Secondary | ICD-10-CM | POA: Insufficient documentation

## 2021-08-30 LAB — COMPREHENSIVE METABOLIC PANEL
ALT: 11 U/L (ref 0–44)
AST: 12 U/L — ABNORMAL LOW (ref 15–41)
Albumin: 3.6 g/dL (ref 3.5–5.0)
Alkaline Phosphatase: 86 U/L (ref 38–126)
Anion gap: 9 (ref 5–15)
BUN: 12 mg/dL (ref 8–23)
CO2: 21 mmol/L — ABNORMAL LOW (ref 22–32)
Calcium: 9.1 mg/dL (ref 8.9–10.3)
Chloride: 110 mmol/L (ref 98–111)
Creatinine, Ser: 0.81 mg/dL (ref 0.44–1.00)
GFR, Estimated: >60 mL/min (ref >60)
Glucose, Bld: 131 mg/dL — ABNORMAL HIGH (ref 70–99)
Potassium: 3.3 mmol/L — ABNORMAL LOW (ref 3.5–5.1)
Sodium: 140 mmol/L (ref 135–145)
Total Bilirubin: 0.9 mg/dL (ref 0.3–1.2)
Total Protein: 6.7 g/dL (ref 6.5–8.1)

## 2021-08-30 LAB — CBC WITH DIFFERENTIAL/PLATELET
Abs Immature Granulocytes: 3.95 10*3/uL — ABNORMAL HIGH (ref 0.00–0.07)
Basophils Absolute: 0 10*3/uL (ref 0.0–0.1)
Basophils Relative: 0 %
Eosinophils Absolute: 0 10*3/uL (ref 0.0–0.5)
Eosinophils Relative: 0 %
HCT: 26.2 % — ABNORMAL LOW (ref 36.0–46.0)
Hemoglobin: 8.5 g/dL — ABNORMAL LOW (ref 12.0–15.0)
Immature Granulocytes: 17 %
Lymphocytes Relative: 6 %
Lymphs Abs: 1.3 10*3/uL (ref 0.7–4.0)
MCH: 31.1 pg (ref 26.0–34.0)
MCHC: 32.4 g/dL (ref 30.0–36.0)
MCV: 96 fL (ref 80.0–100.0)
Monocytes Absolute: 2.3 10*3/uL — ABNORMAL HIGH (ref 0.1–1.0)
Monocytes Relative: 10 %
Neutro Abs: 15.1 10*3/uL — ABNORMAL HIGH (ref 1.7–7.7)
Neutrophils Relative %: 67 %
Platelets: 148 10*3/uL — ABNORMAL LOW (ref 150–400)
RBC: 2.73 MIL/uL — ABNORMAL LOW (ref 3.87–5.11)
RDW: 15.5 % (ref 11.5–15.5)
Smear Review: NORMAL
WBC: 22.7 10*3/uL — ABNORMAL HIGH (ref 4.0–10.5)
nRBC: 1.6 % — ABNORMAL HIGH (ref 0.0–0.2)

## 2021-08-30 MED ORDER — SODIUM CHLORIDE 0.9 % IV SOLN
Freq: Once | INTRAVENOUS | Status: AC
  Filled 2021-08-30: qty 250

## 2021-08-30 MED ORDER — HEPARIN SOD (PORK) LOCK FLUSH 100 UNIT/ML IV SOLN
INTRAVENOUS | Status: AC
  Administered 2021-08-30: 500 [IU]
  Filled 2021-08-30: qty 5

## 2021-08-30 MED ORDER — SODIUM CHLORIDE 0.9% FLUSH
10.0000 mL | INTRAVENOUS | Status: DC | PRN
  Administered 2021-08-30: 10 mL via INTRAVENOUS
  Filled 2021-08-30: qty 10

## 2021-08-30 MED ORDER — SODIUM CHLORIDE 0.9 % IV SOLN
80.0000 mg/m2 | Freq: Once | INTRAVENOUS | Status: AC
  Administered 2021-08-30: 138 mg via INTRAVENOUS
  Filled 2021-08-30: qty 23

## 2021-08-30 MED ORDER — HEPARIN SOD (PORK) LOCK FLUSH 100 UNIT/ML IV SOLN
500.0000 [IU] | Freq: Once | INTRAVENOUS | Status: AC | PRN
  Filled 2021-08-30: qty 5

## 2021-08-30 MED ORDER — HEPARIN SOD (PORK) LOCK FLUSH 100 UNIT/ML IV SOLN
500.0000 [IU] | Freq: Once | INTRAVENOUS | Status: DC
  Filled 2021-08-30: qty 5

## 2021-08-30 MED ORDER — FAMOTIDINE 20 MG IN NS 100 ML IVPB
20.0000 mg | Freq: Once | INTRAVENOUS | Status: AC
  Administered 2021-08-30: 20 mg via INTRAVENOUS
  Filled 2021-08-30: qty 20

## 2021-08-30 MED ORDER — DIPHENHYDRAMINE HCL 50 MG/ML IJ SOLN
50.0000 mg | Freq: Once | INTRAMUSCULAR | Status: AC
  Administered 2021-08-30: 50 mg via INTRAVENOUS
  Filled 2021-08-30: qty 1

## 2021-08-30 MED ORDER — SODIUM CHLORIDE 0.9 % IV SOLN
10.0000 mg | Freq: Once | INTRAVENOUS | Status: AC
  Administered 2021-08-30: 10 mg via INTRAVENOUS
  Filled 2021-08-30: qty 10

## 2021-08-30 NOTE — Patient Instructions (Signed)
[facility] [place] [facility]  Discharge Instructions: Thank you for choosing [facility] to provide your oncology and hematology care.  If you have a lab appointment with the Cancer Center, please go directly to the Cancer Center and check in at the registration area.  Wear comfortable clothing and clothing appropriate for easy access to any Portacath or PICC line.   We strive to give you quality time with your provider. You may need to reschedule your appointment if you arrive late (15 or more minutes).  Arriving late affects you and other patients whose appointments are after yours.  Also, if you miss three or more appointments without notifying the office, you may be dismissed from the clinic at the provider's discretion.      For prescription refill requests, have your pharmacy contact our office and allow 72 hours for refills to be completed.    Today you received the following chemotherapy and/or immunotherapy agents Taxol        To help prevent nausea and vomiting after your treatment, we encourage you to take your nausea medication as directed.  BELOW ARE SYMPTOMS THAT SHOULD BE REPORTED IMMEDIATELY: *FEVER GREATER THAN 100.4 F (38 C) OR HIGHER *CHILLS OR SWEATING *NAUSEA AND VOMITING THAT IS NOT CONTROLLED WITH YOUR NAUSEA MEDICATION *UNUSUAL SHORTNESS OF BREATH *UNUSUAL BRUISING OR BLEEDING *URINARY PROBLEMS (pain or burning when urinating, or frequent urination) *BOWEL PROBLEMS (unusual diarrhea, constipation, pain near the anus) TENDERNESS IN MOUTH AND THROAT WITH OR WITHOUT PRESENCE OF ULCERS (sore throat, sores in mouth, or a toothache) UNUSUAL RASH, SWELLING OR PAIN  UNUSUAL VAGINAL DISCHARGE OR ITCHING   Items with * indicate a potential emergency and should be followed up as soon as possible or go to the Emergency Department if any problems should occur.  Please show the CHEMOTHERAPY ALERT CARD or IMMUNOTHERAPY ALERT CARD at check-in to  the Emergency Department and triage nurse.  Should you have questions after your visit or need to cancel or reschedule your appointment, please contact [facility]  [phone] and follow the prompts.  Office hours are 8:00 a.m. to 4:30 p.m. Monday - Friday. Please note that voicemails left after 4:00 p.m. may not be returned until the following business day.  We are closed weekends and major holidays. You have access to a nurse at all times for urgent questions. Please call the main number to the clinic [phone] and follow the prompts.  For any non-urgent questions, you may also contact your provider using MyChart. We now offer e-Visits for anyone 18 and older to request care online for non-urgent symptoms. For details visit [email or web address].   Also download the MyChart app! Go to the app store, search "MyChart", open the app, select [facility], and log in with your MyChart username and password.  Due to Covid, a mask is required upon entering the hospital/clinic. If you do not have a mask, one will be given to you upon arrival. For doctor visits, patients may have 1 support person aged 18 or older with them. For treatment visits, patients cannot have anyone with them due to current Covid guidelines and our immunocompromised population.  

## 2021-08-30 NOTE — Progress Notes (Signed)
Patient tolerated first Taxol treatment well. Patient and VSS. Discharged home.  

## 2021-08-30 NOTE — Progress Notes (Signed)
Hematology/Oncology Progress note [facility] [phone] [phone]   Patient Care Team: McLean-Scocuzza, Nino Glow, MD as PCP - General (Internal Medicine) De Hollingshead, [place] as Pharmacist (Pharmacist) Theodore Demark, RN as Oncology Nurse Navigator  REFERRING PROVIDER: McLean-Scocuzza, Olivia Mackie *  CHIEF COMPLAINTS/REASON FOR VISIT:  multifocal left breast cancer  HISTORY OF PRESENTING ILLNESS:   Tammy Nunez is a  70 y.o.  female with [place] listed below was seen in consultation at the request of  McLean-Scocuzza, Olivia Mackie *  for evaluation of multifocal  04/24/2021, screening mammogram showed large asymmetry in the superior aspect of the breast with associated calcifications and possible masses and a subtle distortions.  No suspicious findings for malignancy right breast.  05/02/2021 left breast diagnostic mammogram and ultrasound showed multiple irregular mass in the left breast. Mammogram mass 1 irregular mass associated with distortion in the upper breast at middle depth, multiple adjacent and possible continuous irregular masses, conglomeration of masses measures at least 5.6 cm.  Associated pleomorphic calcifications extending at least 5 x 3.9 x 3.7 cm Mass 2, 1 cm mass in the upper breast at the middle to posterior depth. Mass 3, 5 mm irregular mass in the upper breast at posterior depth. In total, mass 1-mass 3 span at least 8.2 cm in anterior-posterior extent.  By ultrasound  mass 1 12:00 5 cm from nipple, 2.7 x 1.5 x 4.3 cm Mass 2, 12:00 12 cm from nipple, 0.9 x 0.9 x 0.5 cm Mass 3   12:00 14 cm from nipple, 0.4 x 0.4 x 0.4 cm-uses 2.4 cm superior to mass to Mass 4   11:00  No suspicious left axillary adenopathy.   05/07/2021 -Ultrasound-guided breast biopsy Breast left 12:00 mass 5 cm from nipple biopsy showed invasive mammary carcinoma, no special type, grade 3, high-grade DCIS present, no LVI, ER 90%, PR 11-50%, HER2  negative by IHC Breast left 11:00 mass 10 cm from nipple biopsy showed invasive mammary carcinoma, no special type, grade 2, no DCIS/LVI identified.ER 90%, PR 11-50%, HER2 negative by IHC  Patient is married has no children.  Denies any hormone replacement, oral contraceptive use, family history of breast cancer.  Denies any chest radiation. Patient has a history of stroke with chronic residual weakness of the left side.  She walks with a cane.  05/22/2021 MRI breast showed that nonmass like malignancy spanning over 8 cm, abuting anterior aspect of pectoralis muscle. Discussed with Dr.Cintron and we agree that she will need mastectomy. There is concern of possible positive margin. I recommend neoadjuvant chemotherapy  # medi port placed on 06/06/2021. 06/13/2021 pre chemotherapy Echo. LVEF 60-65%.  Normal global longitudinal strain. Grade 1 diastolic dysfunction.   # 05/22/2021 MRI breast showed  Biopsy-proven malignancy within the UPPER LEFT breast spanning a distance of 8.3 x 4.2 x 4 cm nonmass like enhancement abuts the anterior aspect of the LEFT pectoralis muscle without gross invasion. Oncotype Dx 27. Discussed with Dr.Cintron, there is concern of positive margin.   INTERVAL HISTORY Tammy Nunez is a 70 y.o. female who has above history reviewed by me today presents for follow up visit for chemotherapy of breast cancer Patient was accompanied by her husband. Denies any nausea vomiting diarrhea.  She tolerated last chemotherapy well.  No new complaints.   Review of Systems  Constitutional:  Negative for appetite change, chills, fatigue and fever.  HENT:   Negative for hearing loss and voice change.   Eyes:  Negative for eye problems.  Respiratory:  Negative for chest tightness and cough.   Cardiovascular:  Negative for chest pain.  Gastrointestinal:  Negative for abdominal distention, abdominal pain, blood in stool and nausea.  Endocrine: Negative for hot flashes.  Genitourinary:   Negative for difficulty urinating and frequency.   Musculoskeletal:  Negative for arthralgias.  Skin:  Negative for itching and rash.  Neurological:  Negative for extremity weakness.  Hematological:  Negative for adenopathy. Does not bruise/bleed easily.  Psychiatric/Behavioral:  Negative for confusion.    MEDICAL HISTORY:  Past Medical History:  Diagnosis Date   Breast cancer in female [facility]) 05/08/2021   Chronic left shoulder pain    Coronary artery disease    Diabetes mellitus without complication (HCC)    Dysrhythmia    Hypertension    Paralysis (HCC)    weakness left upper amd lower extremity   PONV (postoperative nausea and vomiting)    Stroke [facility])     SURGICAL HISTORY: Past Surgical History:  Procedure Laterality Date   BREAST BIOPSY Left 05/07/2021   12:00 5cmfn vision clip path pending   BREAST BIOPSY Left 05/07/2021   11:00 10 cmfn mini cork clip path pending   CAROTID PTA/STENT INTERVENTION Left 07/20/2019   Procedure: CAROTID PTA/STENT INTERVENTION;  Surgeon: Katha Cabal, MD;  Location: [facility];  Service: Cardiovascular;  Laterality: Left;   ECTOPIC PREGNANCY SURGERY     PORTACATH PLACEMENT N/A 06/06/2021   Procedure: INSERTION PORT-A-CATH;  Surgeon: Herbert Pun, MD;  Location: [facility] ORS;  Service: General;  Laterality: N/A;    SOCIAL HISTORY: Social History   Socioeconomic History   Marital status: Married    Spouse name: Not on file   Number of children: Not on file   Years of education: Not on file   Highest education level: Not on file  Occupational History   Not on file  Tobacco Use   Smoking status: Former    Packs/day: 0.50    Years: 15.00    Pack years: 7.50    Types: Cigarettes    Quit date: 04/11/2019    Years since quitting: 2.3   Smokeless tobacco: Never  Vaping Use   Vaping Use: Never used  Substance and Sexual Activity   Alcohol use: Never   Drug use: Never   Sexual activity: Not Currently  Other Topics  Concern   Not on file  Social History Narrative   No kids    Married with husband    Used to work [employer] broadcast assistant    Social Determinants of Radio broadcast assistant Strain: Low Risk    Difficulty of Paying Living Expenses: Not hard at all  Food Insecurity: Not on file  Transportation Needs: Not on file  Physical Activity: Sufficiently Active   Days of Exercise per Week: 7 days   Minutes of Exercise per Session: 30 min  Stress: Not on file  Social Connections: Not on file  Intimate Partner Violence: Not on file    FAMILY HISTORY: Family History  Problem Relation Age of Onset   Diabetes type II Mother    Hypertension Father    Heart attack Father    Diabetes type II Brother    Breast cancer Neg Hx     ALLERGIES:  has No Known Allergies.  MEDICATIONS:  Current Outpatient Medications  Medication Sig Dispense Refill   amLODipine (NORVASC) 10 MG tablet Take 1 tablet by mouth once daily 90 tablet 0   aspirin EC 81 MG EC  tablet Take 1 tablet (81 mg total) by mouth daily.     atorvastatin (LIPITOR) 80 MG tablet Take 1 tablet (80 mg total) by mouth daily. 90 tablet 3   blood glucose meter kit and supplies 1 each by Other route 4 (four) times daily. Dispense based on patient and insurance preference. Four times daily as directed. (FOR ICD-10 E10.9, E11.9). 1 each 0   carvedilol (COREG) 25 MG tablet Take 1 tablet (25 mg total) by mouth 2 (two) times daily with a meal. 180 tablet 3   clopidogrel (PLAVIX) 75 MG tablet Take 1 tablet (75 mg total) by mouth daily. Further refills [facility] neurology 90 tablet 3   Continuous Blood Gluc Sensor (FREESTYLE LIBRE 2 SENSOR) MISC Use to check glucose at least every 8 hours 6 each 3   dexamethasone (DECADRON) 4 MG tablet Take 2 tablets (8 mg total) by mouth daily. Take daily for 3 days after chemo. Take with food. 30 tablet 1   empagliflozin (JARDIANCE) 25 MG TABS tablet Take 1 tablet (25 mg total) by mouth daily. 90 tablet 3    lidocaine-prilocaine (EMLA) cream Apply to affected area once 30 g 3   lisinopril (ZESTRIL) 40 MG tablet Take 1 tablet (40 mg total) by mouth daily. 90 tablet 3   loratadine (CLARITIN) 10 MG tablet Take 10 mg by mouth daily.     metFORMIN (GLUCOPHAGE XR) 500 MG 24 hr tablet Take 2 tablets (1,000 mg total) by mouth daily with breakfast. (Patient taking differently: Take 500 mg by mouth 2 (two) times daily.) 180 tablet 3   ondansetron (ZOFRAN) 8 MG tablet Take 1 tablet (8 mg total) by mouth 2 (two) times daily as needed. Start on the third day after chemotherapy. 30 tablet 1   potassium chloride SA (KLOR-CON M20) 20 MEQ tablet Take 1 tablet (20 mEq total) by mouth daily. 90 tablet 3   prochlorperazine (COMPAZINE) 10 MG tablet Take 1 tablet (10 mg total) by mouth every 6 (six) hours as needed (Nausea or vomiting). 30 tablet 1   vitamin B-12 (CYANOCOBALAMIN) 1000 MCG tablet Take 1 tablet (1,000 mcg total) by mouth daily. 30 tablet 1   Vitamin D, Cholecalciferol, 50 MCG (2000 UT) CAPS Take 2,000 Units by mouth.     No current facility-administered medications for this visit.   Facility-Administered Medications Ordered in Other Visits  Medication Dose Route Frequency Provider Last Rate Last Admin   heparin lock flush 100 UNIT/ML injection            heparin lock flush 100 unit/mL  500 Units Intravenous Once [name], MD       sodium chloride flush (NS) 0.9 % injection 10 mL  10 mL Intravenous PRN [name], MD   10 mL at 08/30/21 0850     PHYSICAL EXAMINATION: ECOG PERFORMANCE STATUS: 1 - Symptomatic but completely ambulatory Vitals:   08/30/21 0910  BP: (!) 115/59  Pulse: 86  Resp: 17  Temp: 99 F (37.2 C)  SpO2: 99%   Filed Weights   08/30/21 0910  Weight: 132 lb (59.9 kg)    Physical Exam Constitutional:      General: She is not in acute distress. HENT:     Head: Normocephalic and atraumatic.  Eyes:     General: No scleral icterus. Cardiovascular:     Rate and Rhythm: Normal  rate and regular rhythm.     Heart sounds: Normal heart sounds.  Pulmonary:     Effort: Pulmonary effort is  normal. No respiratory distress.     Breath sounds: No wheezing.  Abdominal:     General: Bowel sounds are normal. There is no distension.     Palpations: Abdomen is soft.  Musculoskeletal:        General: No deformity. Normal range of motion.     Cervical back: Normal range of motion and neck supple.  Skin:    General: Skin is warm and dry.     Findings: No erythema or rash.  Neurological:     Mental Status: She is alert and oriented to person, place, and time. Mental status is at baseline.     Comments: Chronic left upper and lower extremity weakness.  Psychiatric:        Mood and Affect: Mood normal.   Left breast mass has decreasing size     LABORATORY DATA:  I have reviewed the data as listed Lab Results  Component Value Date   WBC 22.7 (H) 08/30/2021   HGB 8.5 (L) 08/30/2021   HCT 26.2 (L) 08/30/2021   MCV 96.0 08/30/2021   PLT 148 (L) 08/30/2021   Recent Labs    08/02/21 0823 08/16/21 0752 08/30/21 0850  NA 140 140 140  K 4.7 3.6 3.3*  CL 111 112* 110  CO2 20* 18* 21*  GLUCOSE 113* 131* 131*  BUN 28* 18 12  CREATININE 0.87 0.75 0.81  CALCIUM 9.2 9.5 9.1  GFRNONAA >60 >60 >60  PROT 7.4 6.7 6.7  ALBUMIN 4.1 3.9 3.6  AST 13* 12* 12*  ALT _0 ALKPHOS 77 99 86  BILITOT 0.2* 0.5 0.9    Iron/TIBC/Ferritin/ %Sat    Component Value Date/Time   IRON 113 12/08/2019 1015   TIBC 317 12/08/2019 1015   FERRITIN 67 12/08/2019 1015   IRONPCTSAT 36 12/08/2019 1015       RADIOGRAPHIC STUDIES: I have personally reviewed the radiological images as listed and agreed with the findings in the report. MR BREAST BILATERAL W [place] CAD  Result Date: 08/26/2021 CLINICAL DATA:  Evaluate treatment response. Patient has completed 5 weeks of chemotherapy. Ultrasound biopsy of a conglomerate of masses in the LEFT breast 12 o'clock 5 centimeters from  nipple showed invasive mammary carcinoma. Biopsy of LEFT breast 11 o'clock location 10 centimeters from the nipple showed invasive mammary carcinoma. Additional masses in the LEFT breast identified with ultrasound were not biopsied. LABS:  None obtained at the time of imaging. EXAM: BILATERAL BREAST MRI WITH AND WITHOUT CONTRAST TECHNIQUE: Multiplanar, multisequence MR images of both breasts were obtained prior to and following the intravenous administration of 6 ml of Gadavist Three-dimensional MR images were rendered by post-processing of the original MR data on an independent workstation. The three-dimensional MR images were interpreted, and findings are reported in the following complete MRI report for this study. Three dimensional images were evaluated at the independent interpreting workstation using the DynaCAD thin client. COMPARISON:  MRI on 05/22/2021 FINDINGS: Breast composition: c. Heterogeneous fibroglandular tissue. Background parenchymal enhancement: Minimal Right breast: No mass or abnormal enhancement. Interval placement of RIGHT-sided Port-A-Cath. Left breast: Numerous masses and adjacent non mass enhancement in the UPPER LEFT breast spans 9.1 x 3.5 x 3.4 centimeters. Previously, mass measured 8.3 x 4.2 x 4.0 centimeters. Mass shows rapid washout type enhancement kinetics. Non mass enhancement is again noted to extend to the LEFT pectoralis muscle, without pectoralis enhancement. Lymph nodes: No abnormal appearing lymph nodes. Ancillary findings:  None. IMPRESSION: 1. Masses and non mass  enhancement in the UPPER LEFT breast, similar in appearance and size compared to prior study. 2. RIGHT breast remains negative. RECOMMENDATION: Treatment plan for LEFT breast malignancy. BI-RADS CATEGORY  6: Known biopsy-proven malignancy. Electronically Signed   By: Nolon Nations M.D.   On: 08/26/2021 13:49  DG Chest Port 1 View  Result Date: 06/06/2021 CLINICAL DATA:  Post Port-A-Cath placement EXAM:  PORTABLE CHEST 1 VIEW COMPARISON:  Portable exam 1234 hours without priors for comparison FINDINGS: RIGHT jugular Port-A-Cath with tip projecting over SVC. Normal heart size, mediastinal contours, and pulmonary vascularity. Atherosclerotic calcification aorta. Lungs clear. No acute infiltrate, pleural effusion, or pneumothorax. Osseous structures unremarkable. IMPRESSION: No pneumothorax following RIGHT jugular Port-A-Cath insertion. Aortic Atherosclerosis (ICD10-I70.0). Electronically Signed   By: Lavonia Dana M.D.   On: 06/06/2021 13:24   DG C-Arm 1-60 Min-No Report  Result Date: 06/06/2021 Fluoroscopy was utilized by the requesting physician.  No radiographic interpretation.   ECHOCARDIOGRAM COMPLETE  Result Date: 06/13/2021    ECHOCARDIOGRAM REPORT   Patient Name:   Tammy Nunez Date of Exam: 06/13/2021 Medical Rec #:  [id number]       Height:       61.0 in Accession #:    [id number]      Weight:       155.0 lb Date of Birth:  Oct 31, 1951       BSA:          1.695 m Patient Age:    70 years        BP:           153/76 mmHg Patient Gender: F               HR:           76 bpm. Exam Location:  [facility] Procedure: 2D Echo, Color Doppler, Cardiac Doppler and Strain Analysis Indications:     Z09 Chemotherapy  History:         Patient has prior history of Echocardiogram examinations, most                  recent 06/07/2019. CAD, Stroke; Risk Factors:Diabetes and                  Hypertension. Invasive carcinoma of breast.  Sonographer:     Charmayne Sheer RDCS (AE) Referring Phys:  [id number] Nakari Bracknell Diagnosing Phys: Ida Rogue MD  Sonographer Comments: Global longitudinal strain was attempted. IMPRESSIONS  1. Left ventricular ejection fraction, by estimation, is 60 to 65%. The left ventricle has normal function. The left ventricle has no regional wall motion abnormalities. Left ventricular diastolic parameters are consistent with Grade I diastolic dysfunction (impaired relaxation). The average left ventricular global  longitudinal strain is -22.5 %. The global longitudinal strain is normal.  2. Right ventricular systolic function is normal. The right ventricular size is normal. Tricuspid regurgitation signal is inadequate for assessing PA pressure. FINDINGS  Left Ventricle: Left ventricular ejection fraction, by estimation, is 60 to 65%. The left ventricle has normal function. The left ventricle has no regional wall motion abnormalities. The average left ventricular global longitudinal strain is -22.5 %. The global longitudinal strain is normal. The left ventricular internal cavity size was normal in size. There is no left ventricular hypertrophy. Left ventricular diastolic parameters are consistent with Grade I diastolic dysfunction (impaired relaxation). Right Ventricle: The right ventricular size is normal. No increase in right ventricular wall thickness. Right ventricular systolic function is normal. Tricuspid regurgitation signal is inadequate for  assessing PA pressure. Left Atrium: Left atrial size was normal in size. Right Atrium: Right atrial size was normal in size. Pericardium: There is no evidence of pericardial effusion. Mitral Valve: The mitral valve is normal in structure. No evidence of mitral valve regurgitation. No evidence of mitral valve stenosis. MV peak gradient, 4.0 mmHg. The mean mitral valve gradient is 2.0 mmHg. Tricuspid Valve: The tricuspid valve is normal in structure. Tricuspid valve regurgitation is not demonstrated. No evidence of tricuspid stenosis. Aortic Valve: The aortic valve was not well visualized. There is mild calcification of the aortic valve. Aortic valve regurgitation is not visualized. Mild aortic valve sclerosis is present, with no evidence of aortic valve stenosis. Aortic valve mean gradient measures 5.0 mmHg. Aortic valve peak gradient measures 9.1 mmHg. Aortic valve area, by VTI measures 2.47 cm. Pulmonic Valve: The pulmonic valve was normal in structure. Pulmonic valve  regurgitation is not visualized. No evidence of pulmonic stenosis. Aorta: The aortic root is normal in size and structure. Venous: The inferior vena cava is normal in size with greater than 50% respiratory variability, suggesting right atrial pressure of 3 mmHg. IAS/Shunts: No atrial level shunt detected by color flow Doppler.  LEFT VENTRICLE PLAX 2D LVIDd:         3.50 cm  Diastology LVIDs:         2.60 cm  LV e' medial:    7.29 cm/s LV PW:         1.00 cm  LV E/e' medial:  10.3 LV IVS:        0.70 cm  LV e' lateral:   8.92 cm/s LVOT diam:     1.80 cm  LV E/e' lateral: 8.4 LV SV:         67 LV SV Index:   40       2D Longitudinal Strain LVOT Area:     2.54 cm 2D Strain GLS Avg:     -22.5 %  RIGHT VENTRICLE RV Basal diam:  3.00 cm TAPSE (M-mode): 2.4 cm LEFT ATRIUM             Index       RIGHT ATRIUM          Index LA diam:        3.00 cm 1.77 cm/m  RA Area:     7.18 cm LA Vol (A2C):   21.4 ml 12.63 ml/m RA Volume:   11.30 ml 6.67 ml/m LA Vol (A4C):   20.9 ml 12.33 ml/m LA Biplane Vol: 21.3 ml 12.57 ml/m  AORTIC VALVE                    PULMONIC VALVE AV Area (Vmax):    2.01 cm     PV Vmax:       0.90 m/s AV Area (Vmean):   2.20 cm     PV Vmean:      64.000 cm/s AV Area (VTI):     2.47 cm     PV VTI:        0.164 m AV Vmax:           151.00 cm/s  PV Peak grad:  3.2 mmHg AV Vmean:          105.000 cm/s PV Mean grad:  2.0 mmHg AV VTI:            0.273 m AV Peak Grad:      9.1 mmHg AV Mean Grad:  5.0 mmHg LVOT Vmax:         119.00 cm/s LVOT Vmean:        90.900 cm/s LVOT VTI:          0.265 m LVOT/AV VTI ratio: 0.97  AORTA Ao Root diam: 2.30 cm MITRAL VALVE MV Area (PHT): 3.01 cm     SHUNTS MV Area VTI:   2.69 cm     Systemic VTI:  0.26 m MV Peak grad:  4.0 mmHg     Systemic Diam: 1.80 cm MV Mean grad:  2.0 mmHg MV Vmax:       1.00 m/s MV Vmean:      61.8 cm/s MV Decel Time: 252 msec MV E velocity: 75.00 cm/s MV A velocity: 101.00 cm/s MV E/A ratio:  0.74 Ida Rogue MD Electronically signed by  Ida Rogue MD Signature Date/Time: 06/13/2021/5:34:59 PM    Final        ASSESSMENT & PLAN:  1. Malignant neoplasm of left breast in female, estrogen receptor positive, unspecified site of breast ([place])   2. Encounter for antineoplastic chemotherapy   3. Chemotherapy induced nausea and vomiting   4. Thrombocytopenia ([place])   Cancer Staging Invasive carcinoma of breast ([place]) Staging form: Breast, AJCC 8th Edition - Clinical stage from 05/16/2021: Stage IIB (cT3, cN0, cM0, G3, ER+, PR+, HER2-) - Signed by [name], MD on 06/03/2021  #Left breast invasive carcinoma, multiple sites.  ER 90%, PR 11-50% positive, HER2 negative by IHC cT3 N0 On neoadjuvant chemotherapy, finished 4 cycles of ddAC Interval MRI showed stable size. Recommend patient to continue neoadjuvant chemotherapy with weekly Taxol. Labs are reviewed and discussed with patient Proceed with cycle 1 Taxol.  We discussed about the rationale and potential side effects of Taxol.  #Chemotherapy-induced nausea vomiting, continue home antiemetics as needed.  Symptoms are stable. #Chemotherapy-induced anemia, hemoglobin has dropped to 8.5.  Anticipate further decrease with additional chemotherapy.   #Hypokalemia, patient is on potassium chloride 20 mEq daily.  Continue. Follow up in 1 week with lab MD Taxol.  All questions were answered. The patient knows to call the clinic with any problems questions or concerns.  cc McLean-Scocuzza, Olam Idler, MD, PhD 08/30/2021

## 2021-08-30 NOTE — Progress Notes (Signed)
Patient here for oncology follow-up appointment, expresses no complaints or concerns at this time.    

## 2021-09-03 ENCOUNTER — Ambulatory Visit (INDEPENDENT_AMBULATORY_CARE_PROVIDER_SITE_OTHER): Payer: PPO | Admitting: Pharmacist

## 2021-09-03 DIAGNOSIS — I152 Hypertension secondary to endocrine disorders: Secondary | ICD-10-CM

## 2021-09-03 DIAGNOSIS — C50919 Malignant neoplasm of unspecified site of unspecified female breast: Secondary | ICD-10-CM

## 2021-09-03 DIAGNOSIS — E1159 Type 2 diabetes mellitus with other circulatory complications: Secondary | ICD-10-CM

## 2021-09-03 DIAGNOSIS — E1165 Type 2 diabetes mellitus with hyperglycemia: Secondary | ICD-10-CM

## 2021-09-03 DIAGNOSIS — Z8673 Personal history of transient ischemic attack (TIA), and cerebral infarction without residual deficits: Secondary | ICD-10-CM

## 2021-09-03 NOTE — Chronic Care Management (AMB) (Signed)
Chronic Care Management Pharmacy Note  09/03/2021 Name:  Tammy Nunez MRN:  [id number] DOB:  06/14/1951   Subjective: Tammy Nunez is an 70 y.o. year old female who is a primary patient of McLean-Scocuzza, Nino Glow, MD.  Collaborating with covering provider Tommi Rumps, MD while PCP is on leave. The CCM team was consulted for assistance with disease management and care coordination needs.    Engaged with patient by telephone for follow up visit in response to provider referral for pharmacy case management and/or care coordination services.   Consent to Services:  The patient was given information about Chronic Care Management services, agreed to services, and gave verbal consent prior to initiation of services.  Please see initial visit note for detailed documentation.   Patient Care Team: McLean-Scocuzza, Nino Glow, MD as PCP - General (Internal Medicine) De Hollingshead, RPH-CPP as Pharmacist (Pharmacist) Theodore Demark, RN as Oncology Nurse Navigator  R  Objective:  Lab Results  Component Value Date   CREATININE 0.81 08/30/2021   CREATININE 0.75 08/16/2021   CREATININE 0.87 08/02/2021    Lab Results  Component Value Date   HGBA1C 7.5 (H) 01/03/2021   Last diabetic Eye exam:  Lab Results  Component Value Date/Time   HMDIABEYEEXA No Retinopathy 03/04/2021 12:00 AM   HMDIABEYEEXA No Retinopathy 03/04/2021 12:00 AM    Last diabetic Foot exam: No results found for: HMDIABFOOTEX      Component Value Date/Time   CHOL 138 01/03/2021 0845   TRIG 80.0 01/03/2021 0845   HDL 41.20 01/03/2021 0845   CHOLHDL 3 01/03/2021 0845   VLDL 16.0 01/03/2021 0845   LDLCALC 81 01/03/2021 0845    Hepatic Function Latest Ref Rng & Units 08/30/2021 08/16/2021 08/02/2021  Total Protein 6.5 - 8.1 g/dL 6.7 6.7 7.4  Albumin 3.5 - 5.0 g/dL 3.6 3.9 4.1  AST 15 - 41 U/L 12(L) 12(L) 13(L)  ALT 0 - 44 U/L 11 13 15   Alk Phosphatase 38 - 126 U/L 86 99 77  Total Bilirubin 0.3 - 1.2  mg/dL 0.9 0.5 0.2(L)    Lab Results  Component Value Date/Time   TSH 2.67 01/03/2021 08:45 AM   TSH 3.52 12/08/2019 10:15 AM    CBC Latest Ref Rng & Units 08/30/2021 08/16/2021 08/02/2021  WBC 4.0 - 10.5 K/uL 22.7(H) 30.2(H) 14.0(H)  Hemoglobin 12.0 - 15.0 g/dL 8.5(L) 10.0(L) 10.8(L)  Hematocrit 36.0 - 46.0 % 26.2(L) 30.4(L) 33.3(L)  Platelets 150 - 400 K/uL 148(L) 160 230    Lab Results  Component Value Date/Time   VD25OH 65.22 08/02/2020 09:47 AM   VD25OH 21.69 (L) 12/08/2019 10:15 AM     Social History   Tobacco Use  Smoking Status Former   Packs/day: 0.50   Years: 15.00   Pack years: 7.50   Types: Cigarettes   Quit date: 04/11/2019   Years since quitting: 2.4  Smokeless Tobacco Never   BP Readings from Last 3 Encounters:  08/30/21 (!) 103/58  08/30/21 (!) 115/59  08/16/21 130/77   Pulse Readings from Last 3 Encounters:  08/30/21 89  08/30/21 86  08/16/21 91   Wt Readings from Last 3 Encounters:  08/30/21 132 lb (59.9 kg)  08/16/21 134 lb (60.8 kg)  08/02/21 140 lb (63.5 kg)    Assessment: Review of patient past medical history, allergies, medications, health status, including review of consultants reports, laboratory and other test data, was performed as part of comprehensive evaluation and provision of chronic care management services.  SDOH:  (Social Determinants of Health) assessments and interventions performed:  SDOH Interventions    Flowsheet Row Most Recent Value  SDOH Interventions   Financial Strain Interventions Intervention Not Indicated       CCM Care Plan  No Known Allergies  Medications Reviewed Today     Reviewed by De Hollingshead, RPH-CPP (Pharmacist) on 09/03/21 at [phone]  Med List Status: <None>   Medication Order Taking? Sig Documenting Provider Last Dose Status Informant  amLODipine (NORVASC) 10 MG tablet [id number] Yes Take 1 tablet by mouth once daily McLean-Scocuzza, Nino Glow, MD Taking Active   aspirin EC 81 MG EC  tablet [id number] Yes Take 1 tablet (81 mg total) by mouth daily. Hillary Bow, MD Taking Active Spouse/Significant Other  atorvastatin (LIPITOR) 80 MG tablet [id number] Yes Take 1 tablet (80 mg total) by mouth daily. McLean-Scocuzza, Nino Glow, MD Taking Active Spouse/Significant Other  blood glucose meter kit and supplies [id number]  1 each by Other route 4 (four) times daily. Dispense based on patient and insurance preference. Four times daily as directed. (FOR ICD-10 E10.9, E11.9). [name], FNP  Active Spouse/Significant Other  carvedilol (COREG) 25 MG tablet [id number] Yes Take 1 tablet (25 mg total) by mouth 2 (two) times daily with a meal. McLean-Scocuzza, Nino Glow, MD Taking Active Spouse/Significant Other  clopidogrel (PLAVIX) 75 MG tablet [id number] Yes Take 1 tablet (75 mg total) by mouth daily. Further refills [facility] McLean-Scocuzza, Nino Glow, MD Taking Active Spouse/Significant Other  Continuous Blood Gluc Sensor (FREESTYLE LIBRE 2 SENSOR) [place] [id number]  Use to check glucose at least every 8 hours McLean-Scocuzza, Nino Glow, MD  Active Spouse/Significant Other  dexamethasone (DECADRON) 4 MG tablet [id number] Yes Take 2 tablets (8 mg total) by mouth daily. Take daily for 3 days after chemo. Take with food. [name], MD Taking Active   empagliflozin (JARDIANCE) 25 MG TABS tablet [id number] Yes Take 1 tablet (25 mg total) by mouth daily. McLean-Scocuzza, Nino Glow, MD Taking Active Spouse/Significant Other  lidocaine-prilocaine (EMLA) cream [id number]  Apply to affected area once [name], MD  Active   lisinopril (ZESTRIL) 40 MG tablet [id number] Yes Take 1 tablet (40 mg total) by mouth daily. McLean-Scocuzza, Nino Glow, MD Taking Active Spouse/Significant Other  loratadine (CLARITIN) 10 MG tablet [id number] Yes Take 10 mg by mouth daily. [provider] Taking Active   metFORMIN (GLUCOPHAGE XR) 500 MG 24 hr tablet [id number] Yes Take 2 tablets (1,000 mg total) by mouth daily with breakfast.   Patient taking differently: Take 500 mg by mouth 2 (two) times daily.   McLean-Scocuzza, Nino Glow, MD Taking Active   ondansetron [facility]) 8 MG tablet [id number] Yes Take 1 tablet (8 mg total) by mouth 2 (two) times daily as needed. Start on the third day after chemotherapy. [name], MD Taking Active   potassium chloride SA (KLOR-CON M20) 20 MEQ tablet [id number] Yes Take 1 tablet (20 mEq total) by mouth daily. McLean-Scocuzza, Nino Glow, MD Taking Active Spouse/Significant Other  prochlorperazine (COMPAZINE) 10 MG tablet [id number] Yes Take 1 tablet (10 mg total) by mouth every 6 (six) hours as needed (Nausea or vomiting). [name], MD Taking Active   vitamin B-12 (CYANOCOBALAMIN) 1000 MCG tablet [id number] Yes Take 1 tablet (1,000 mcg total) by mouth daily. Bary Leriche, PA-C Taking Active Spouse/Significant Other  Vitamin D, Cholecalciferol, 50 MCG (2000 UT) CAPS [id number] Yes Take 2,000 Units by mouth. [provider] Taking Active Spouse/Significant Other  Patient Active Problem List   Diagnosis Date Noted   Chemotherapy induced nausea and vomiting 06/28/2021   Numbness 06/03/2021   Goals of care, counseling/discussion 05/16/2021   Breast cancer in female [facility]) 05/08/2021   Invasive carcinoma of breast ([place]) 05/08/2021   Abnormal mammogram of left breast 04/25/2021   Obesity, diabetes, and hypertension syndrome ([place]) 10/11/2020   Hypertension associated with diabetes ([place]) 10/11/2020   Cataract of both eyes 12/28/2019   Vitamin D deficiency 12/11/2019   History of stroke 09/07/2019   Left hemiparesis ([place]) 09/07/2019   Carotid stenosis, symptomatic, with infarction ([place]) 07/20/2019   Type 2 diabetes mellitus with hyperglycemia, without long-term current use of insulin ([place])    AKI (acute kidney injury) ([place])    Essential hypertension    Seizures ([place])    New onset type 2 diabetes mellitus ([place])     Immunization History  Administered Date(s) Administered    PFIZER Comirnaty(Gray Top)Covid-19 Tri-Sucrose Vaccine 03/26/2021   PFIZER(Purple Top)SARS-COV-2 Vaccination 01/11/2020, 02/01/2020, 09/04/2020   Pneumococcal Conjugate-13 10/11/2020    Conditions to be addressed/monitored: HTN, HLD, and DMII  Care Plan : Medication Management  Updates made by De Hollingshead, RPH-CPP since 09/03/2021 12:00 AM     Problem: Diabetes, Hx CVA, HTN, HLD      Long-Range Goal: Disease State Management   Recent Progress: On track  Priority: High  Note:   Current Barriers:  Unable to independently monitor therapeutic efficacy Unable to achieve control of cardiovascular risk reduction   Pharmacist Clinical Goal(s):  Over the next 90 days, patient will achieve adherence to monitoring guidelines and medication adherence to achieve therapeutic efficacy. Over the next 90 days, achieve control of diabetes as evidenced by improvement in A1c through collaboration with PharmD and provider.   Interventions: 1:1 collaboration with McLean-Scocuzza, Nino Glow, MD regarding development and update of comprehensive plan of care as evidenced by provider attestation and co-signature Inter-disciplinary care team collaboration (see longitudinal plan of care) Comprehensive medication review performed; medication list updated in electronic medical record  Health Maintenance   Yearly diabetic eye exam: up to date Yearly diabetic foot exam: up to date Urine microalbumin: up to date Yearly influenza vaccination: due - recommended to discuss w/ Dr. Tasia Catchings regarding timing following chemotherapy Td/Tdap vaccination: due -  recommended to discuss w/ Dr. Tasia Catchings regarding timing following chemotherapy Pneumonia vaccination: due PCV 20/PPSV23 - recommended to discuss w/ Dr. Tasia Catchings regarding timing following chemotherapy  COVID vaccinations: due bivalent booster, recommended to discus w/ Dr. Tasia Catchings regarding timing Shingrix vaccinations: due - recommend to discuss w/ Dr. Tasia Catchings regarding timing  following chemotherapy Colonoscopy: due - deferred Bone density scan: up to date Mammogram: up to date Diabetes: Uncontrolled but improved per SMBG; current treatment: metformin XR 500 mg BID, Jardiance 25 mg daily;  Current glucose readings: utilizing Libre 2 CGM; Date of Download: last 7 days Average Glucose: 116 mg/dL - 12 am - 6 am: 102; 98; 100; 131; Time in Goal:  - Time in range 70-180: 100% - Time above range: 0% - Time below range: 0% Again reviewed that if she has post-chemo days where she generally experiences nausea/vomiting and there is a worry of dehydration to HOLD Jardiance to reduce risk of euglycemic DKA. Patient verbalized understanding.  Recommended to continue current regimen at this time  Hypertension: Controlled per recent clinic readings; current treatment: carvedilol 25 mg BID; lisinopril 40 mg QAM, amlodipine 10 mg QAM BP checked with chemo. Remains well controlled.  Denies any concerns  with lightheadedness, dizziness, or any other s/sx hypotension.  Recommended to continue current regimen  at this time  Hyperlipidemia, secondary ASCVD risk reduction Uncontrolled but likely improved d/t medication change; current treatment: atorvastatin 80 mg daily; follows w/ Dr. [facility] Antiplatelet therapy: aspirin 81 mg daily, clopidogrel 75 mg daily Recommended to continue current regimen. Follow up with PCP next month w/ lab work.  Breast Cancer of L breast (ER/PR +, HER2 -) Plan for neoadjuvant chemo w/ doxorubicin and cyclophosphamide followed by mastectomy.  Nausea/vomiting: dexamethasone 8 mg, ondansetron 8 mg BID scheduled for 3 days after chemo; prochlorperizine 10 mg PRN nausea Reports that she was tired for a few days after her first treatment, but feeling better today Advised holding Jardiance on days with risk of dehydration as above Continue current regimen at this time, along with collaboration with oncology  Supplements: Vitamin B12, Vitamin D.    Patient Goals/Self-Care Activities Over the next 90 days, patient will:  - take medications as prescribed check blood glucose three times daily using CGM, document, and provide at future appointments  Follow Up Plan: Telephone follow up appointment with care management team member scheduled for: ~ 12 weeks      Medication Assistance: None required.  Patient affirms current coverage meets needs.  Patient's preferred pharmacy is:  [facility] [address] ([address]), [place] - [address] [place] ([place]) [place] [address] Phone: [phone] Fax: [phone]   Follow Up:  Patient agrees to Care Plan and Follow-up.  Plan: Telephone follow up appointment with care management team member scheduled for:  12 weeks  Catie [name], PharmD, [place], [place] Clinical Pharmacist [employer] [facility] [employer] [phone]

## 2021-09-03 NOTE — Patient Instructions (Signed)
Visit Information  PATIENT GOALS:  Goals Addressed               This Visit's Progress     Patient Stated     Disease Self Management (pt-stated)        Patient Goals/Self-Care Activities Over the next 90 days, patient will:  - take medications as prescribed check blood glucose three times daily using CGM, document, and provide at future appointments         Patient verbalizes understanding of instructions provided today and agrees to view in [place].   Plan: Telephone follow up appointment with care management team member scheduled for:  12 weeks  Catie [name], PharmD, [place], [place] Clinical Pharmacist [employer] at [employer] [phone]

## 2021-09-06 ENCOUNTER — Inpatient Hospital Stay: Payer: PPO

## 2021-09-06 ENCOUNTER — Other Ambulatory Visit: Payer: Self-pay

## 2021-09-06 ENCOUNTER — Encounter: Payer: Self-pay | Admitting: Oncology

## 2021-09-06 ENCOUNTER — Inpatient Hospital Stay (HOSPITAL_BASED_OUTPATIENT_CLINIC_OR_DEPARTMENT_OTHER): Payer: PPO | Admitting: Oncology

## 2021-09-06 VITALS — BP 111/77 | HR 112 | Temp 98.7°F | Wt 128.6 lb

## 2021-09-06 VITALS — Resp 20

## 2021-09-06 DIAGNOSIS — D6481 Anemia due to antineoplastic chemotherapy: Secondary | ICD-10-CM | POA: Diagnosis not present

## 2021-09-06 DIAGNOSIS — C50912 Malignant neoplasm of unspecified site of left female breast: Secondary | ICD-10-CM

## 2021-09-06 DIAGNOSIS — T451X5A Adverse effect of antineoplastic and immunosuppressive drugs, initial encounter: Secondary | ICD-10-CM | POA: Diagnosis not present

## 2021-09-06 DIAGNOSIS — K521 Toxic gastroenteritis and colitis: Secondary | ICD-10-CM

## 2021-09-06 DIAGNOSIS — D696 Thrombocytopenia, unspecified: Secondary | ICD-10-CM

## 2021-09-06 DIAGNOSIS — E876 Hypokalemia: Secondary | ICD-10-CM

## 2021-09-06 DIAGNOSIS — Z95828 Presence of other vascular implants and grafts: Secondary | ICD-10-CM

## 2021-09-06 DIAGNOSIS — Z5111 Encounter for antineoplastic chemotherapy: Secondary | ICD-10-CM

## 2021-09-06 DIAGNOSIS — Z170 Estrogen receptor positive status [ER+]: Secondary | ICD-10-CM | POA: Diagnosis not present

## 2021-09-06 LAB — COMPREHENSIVE METABOLIC PANEL
ALT: 34 U/L (ref 0–44)
AST: 21 U/L (ref 15–41)
Albumin: 4 g/dL (ref 3.5–5.0)
Alkaline Phosphatase: 56 U/L (ref 38–126)
Anion gap: 10 (ref 5–15)
BUN: 21 mg/dL (ref 8–23)
CO2: 20 mmol/L — ABNORMAL LOW (ref 22–32)
Calcium: 8.5 mg/dL — ABNORMAL LOW (ref 8.9–10.3)
Chloride: 108 mmol/L (ref 98–111)
Creatinine, Ser: 0.87 mg/dL (ref 0.44–1.00)
GFR, Estimated: >60 mL/min (ref >60)
Glucose, Bld: 129 mg/dL — ABNORMAL HIGH (ref 70–99)
Potassium: 3 mmol/L — ABNORMAL LOW (ref 3.5–5.1)
Sodium: 138 mmol/L (ref 135–145)
Total Bilirubin: 0.5 mg/dL (ref 0.3–1.2)
Total Protein: 6.6 g/dL (ref 6.5–8.1)

## 2021-09-06 LAB — CBC WITH DIFFERENTIAL/PLATELET
Abs Immature Granulocytes: 0.32 10*3/uL — ABNORMAL HIGH (ref 0.00–0.07)
Basophils Absolute: 0.1 10*3/uL (ref 0.0–0.1)
Basophils Relative: 1 %
Eosinophils Absolute: 0 10*3/uL (ref 0.0–0.5)
Eosinophils Relative: 0 %
HCT: 29.5 % — ABNORMAL LOW (ref 36.0–46.0)
Hemoglobin: 9.5 g/dL — ABNORMAL LOW (ref 12.0–15.0)
Immature Granulocytes: 4 %
Lymphocytes Relative: 15 %
Lymphs Abs: 1.1 10*3/uL (ref 0.7–4.0)
MCH: 31 pg (ref 26.0–34.0)
MCHC: 32.2 g/dL (ref 30.0–36.0)
MCV: 96.4 fL (ref 80.0–100.0)
Monocytes Absolute: 0.7 10*3/uL (ref 0.1–1.0)
Monocytes Relative: 9 %
Neutro Abs: 5.4 10*3/uL (ref 1.7–7.7)
Neutrophils Relative %: 71 %
Platelets: 321 10*3/uL (ref 150–400)
RBC: 3.06 MIL/uL — ABNORMAL LOW (ref 3.87–5.11)
RDW: 15.8 % — ABNORMAL HIGH (ref 11.5–15.5)
WBC: 7.6 10*3/uL (ref 4.0–10.5)
nRBC: 13.6 % — ABNORMAL HIGH (ref 0.0–0.2)

## 2021-09-06 LAB — MAGNESIUM: Magnesium: 1.3 mg/dL — ABNORMAL LOW (ref 1.7–2.4)

## 2021-09-06 MED ORDER — SODIUM CHLORIDE 0.9% FLUSH
10.0000 mL | Freq: Once | INTRAVENOUS | Status: AC
  Administered 2021-09-06: 10 mL via INTRAVENOUS
  Filled 2021-09-06: qty 10

## 2021-09-06 MED ORDER — HEPARIN SOD (PORK) LOCK FLUSH 100 UNIT/ML IV SOLN
500.0000 [IU] | Freq: Once | INTRAVENOUS | Status: AC
  Filled 2021-09-06: qty 5

## 2021-09-06 MED ORDER — HEPARIN SOD (PORK) LOCK FLUSH 100 UNIT/ML IV SOLN
INTRAVENOUS | Status: AC
  Administered 2021-09-06: 500 [IU] via INTRAVENOUS
  Filled 2021-09-06: qty 5

## 2021-09-06 MED ORDER — SLOW-MAG 71.5-119 MG PO TBEC: 1.0000 | DELAYED_RELEASE_TABLET | Freq: Every day | ORAL | 1 refills | Status: DC

## 2021-09-06 MED ORDER — LOPERAMIDE HCL 2 MG PO CAPS: 2.0000 mg | ORAL_CAPSULE | ORAL | 1 refills | Status: DC

## 2021-09-06 MED ORDER — SODIUM CHLORIDE 0.9% FLUSH
10.0000 mL | INTRAVENOUS | Status: DC | PRN
  Administered 2021-09-06: 10 mL via INTRAVENOUS
  Filled 2021-09-06: qty 10

## 2021-09-06 MED ORDER — POTASSIUM CHLORIDE IN NACL 20-0.9 MEQ/L-% IV SOLN
Freq: Once | INTRAVENOUS | Status: AC
  Filled 2021-09-06: qty 1000

## 2021-09-06 MED ORDER — MAGNESIUM SULFATE 4 GM/100ML IV SOLN
4.0000 g | Freq: Once | INTRAVENOUS | Status: AC
  Administered 2021-09-06: 4 g via INTRAVENOUS
  Filled 2021-09-06: qty 100

## 2021-09-06 NOTE — Progress Notes (Signed)
Hematology/Oncology Progress note [facility] Telephone:([phone] [phone]   Patient Care Team: McLean-Scocuzza, Nino Glow, MD as PCP - General (Internal Medicine) De Hollingshead, [place] as Pharmacist (Pharmacist) Theodore Demark, RN as Oncology Nurse Navigator  REFERRING PROVIDER: McLean-Scocuzza, Olivia Mackie *  CHIEF COMPLAINTS/REASON FOR VISIT:  multifocal left breast cancer  HISTORY OF PRESENTING ILLNESS:   Tammy Nunez is a  70 y.o.  female with PMH listed below was seen in consultation at the request of  McLean-Scocuzza, Olivia Mackie *  for evaluation of multifocal left breast cancer.   04/24/2021, screening mammogram showed large asymmetry in the superior aspect of the breast with associated calcifications and possible masses and a subtle distortions.  No suspicious findings for malignancy right breast.  05/02/2021 left breast diagnostic mammogram and ultrasound showed multiple irregular mass in the left breast. Mammogram mass 1 irregular mass associated with distortion in the upper breast at middle depth, multiple adjacent and possible continuous irregular masses, conglomeration of masses measures at least 5.6 cm.  Associated pleomorphic calcifications extending at least 5 x 3.9 x 3.7 cm Mass 2, 1 cm mass in the upper breast at the middle to posterior depth. Mass 3, 5 mm irregular mass in the upper breast at posterior depth. In total, mass 1-mass 3 span at least 8.2 cm in anterior-posterior extent.  By ultrasound  mass 1 12:00 5 cm from nipple, 2.7 x 1.5 x 4.3 cm Mass 2, 12:00 12 cm from nipple, 0.9 x 0.9 x 0.5 cm Mass 3   12:00 14 cm from nipple, 0.4 x 0.4 x 0.4 cm-uses 2.4 cm superior to mass to Mass 4   11:00  No suspicious left axillary adenopathy.   05/07/2021 -Ultrasound-guided breast biopsy Breast left 12:00 mass 5 cm from nipple biopsy showed invasive mammary carcinoma, no special type, grade 3, high-grade DCIS present, no LVI, ER 90%, PR  11-50%, HER2 negative by IHC Breast left 11:00 mass 10 cm from nipple biopsy showed invasive mammary carcinoma, no special type, grade 2, no DCIS/LVI identified.ER 90%, PR 11-50%, HER2 negative by IHC  Patient is married has no children.  Denies any hormone replacement, oral contraceptive use, family history of breast cancer.  Denies any chest radiation. Patient has a history of stroke with chronic residual weakness of the left side.  She walks with a cane.  05/22/2021 MRI breast showed that nonmass like malignancy spanning over 8 cm, abuting anterior aspect of pectoralis muscle. Discussed with Dr.Cintron and we agree that she will need mastectomy. There is concern of possible positive margin. I recommend neoadjuvant chemotherapy  # medi port placed on 06/06/2021. 06/13/2021 pre chemotherapy Echo. LVEF 60-65%.  Normal global longitudinal strain. Grade 1 diastolic dysfunction.   # 05/22/2021 MRI breast showed  Biopsy-proven malignancy within the UPPER LEFT breast spanning a distance of 8.3 x 4.2 x 4 cm nonmass like enhancement abuts the anterior aspect of the LEFT pectoralis muscle without gross invasion. Oncotype Dx 27. Discussed with Dr.Cintron, there is concern of positive margin.   INTERVAL HISTORY Tammy Nunez is a 70 y.o. female who has above history reviewed by me today presents for follow up visit for chemotherapy of breast cancer Patient was accompanied by her husband. no nausea vomiting.  Patient reports loose bowel movement for which she took some over-the-counter into diarrhea medication.  Today heart rate was high in the clinic.  Denies any fever or chills.  Review of Systems  Constitutional:  Negative for appetite change, chills, fatigue and  fever.  HENT:   Negative for hearing loss and voice change.   Eyes:  Negative for eye problems.  Respiratory:  Negative for chest tightness and cough.   Cardiovascular:  Negative for chest pain.  Gastrointestinal:  Positive for diarrhea.  Negative for abdominal distention, abdominal pain, blood in stool and nausea.  Endocrine: Negative for hot flashes.  Genitourinary:  Negative for difficulty urinating and frequency.   Musculoskeletal:  Negative for arthralgias.  Skin:  Negative for itching and rash.  Neurological:  Negative for extremity weakness.  Hematological:  Negative for adenopathy. Does not bruise/bleed easily.  Psychiatric/Behavioral:  Negative for confusion.    MEDICAL HISTORY:  Past Medical History:  Diagnosis Date   Breast cancer in female [facility]) 05/08/2021   Chronic left shoulder pain    Coronary artery disease    Diabetes mellitus without complication (HCC)    Dysrhythmia    Hypertension    Paralysis (HCC)    weakness left upper amd lower extremity   PONV (postoperative nausea and vomiting)    Stroke [facility])     SURGICAL HISTORY: Past Surgical History:  Procedure Laterality Date   BREAST BIOPSY Left 05/07/2021   12:00 5cmfn vision clip path pending   BREAST BIOPSY Left 05/07/2021   11:00 10 cmfn mini cork clip path pending   CAROTID PTA/STENT INTERVENTION Left 07/20/2019   Procedure: CAROTID PTA/STENT INTERVENTION;  Surgeon: Katha Cabal, MD;  Location: [place] [facility];  Service: Cardiovascular;  Laterality: Left;   ECTOPIC PREGNANCY SURGERY     PORTACATH PLACEMENT N/A 06/06/2021   Procedure: INSERTION PORT-A-CATH;  Surgeon: Herbert Pun, MD;  Location: [facility] ORS;  Service: General;  Laterality: N/A;    SOCIAL HISTORY: Social History   Socioeconomic History   Marital status: Married    Spouse name: Not on file   Number of children: Not on file   Years of education: Not on file   Highest education level: Not on file  Occupational History   Not on file  Tobacco Use   Smoking status: Former    Packs/day: 0.50    Years: 15.00    Pack years: 7.50    Types: Cigarettes    Quit date: 04/11/2019    Years since quitting: 2.4   Smokeless tobacco: Never  Vaping Use   Vaping  Use: Never used  Substance and Sexual Activity   Alcohol use: Never   Drug use: Never   Sexual activity: Not Currently  Other Topics Concern   Not on file  Social History Narrative   No kids    Married with husband    Used to work [employer] broadcast assistant    Social Determinants of Radio broadcast assistant Strain: Low Risk    Difficulty of Paying Living Expenses: Not hard at all  Food Insecurity: Not on file  Transportation Needs: Not on file  Physical Activity: Sufficiently Active   Days of Exercise per Week: 7 days   Minutes of Exercise per Session: 30 min  Stress: Not on file  Social Connections: Not on file  Intimate Partner Violence: Not on file    FAMILY HISTORY: Family History  Problem Relation Age of Onset   Diabetes type II Mother    Hypertension Father    Heart attack Father    Diabetes type II Brother    Breast cancer Neg Hx     ALLERGIES:  has No Known Allergies.  MEDICATIONS:  Current Outpatient Medications  Medication Sig Dispense Refill  amLODipine (NORVASC) 10 MG tablet Take 1 tablet by mouth once daily 90 tablet 0   aspirin EC 81 MG EC tablet Take 1 tablet (81 mg total) by mouth daily.     atorvastatin (LIPITOR) 80 MG tablet Take 1 tablet (80 mg total) by mouth daily. 90 tablet 3   blood glucose meter kit and supplies 1 each by Other route 4 (four) times daily. Dispense based on patient and insurance preference. Four times daily as directed. (FOR ICD-10 E10.9, E11.9). 1 each 0   carvedilol (COREG) 25 MG tablet Take 1 tablet (25 mg total) by mouth 2 (two) times daily with a meal. 180 tablet 3   clopidogrel (PLAVIX) 75 MG tablet Take 1 tablet (75 mg total) by mouth daily. Further refills [facility] neurology 90 tablet 3   Continuous Blood Gluc Sensor (FREESTYLE LIBRE 2 SENSOR) MISC Use to check glucose at least every 8 hours 6 each 3   dexamethasone (DECADRON) 4 MG tablet Take 2 tablets (8 mg total) by mouth daily. Take daily for 3 days after chemo.  Take with food. 30 tablet 1   empagliflozin (JARDIANCE) 25 MG TABS tablet Take 1 tablet (25 mg total) by mouth daily. 90 tablet 3   lidocaine-prilocaine (EMLA) cream Apply to affected area once 30 g 3   lisinopril (ZESTRIL) 40 MG tablet Take 1 tablet (40 mg total) by mouth daily. 90 tablet 3   loperamide (IMODIUM) 2 MG capsule Take 1 capsule (2 mg total) by mouth See admin instructions. Take 53m at the onset of diarrhea, and then 241mafter each loose bowel movement, maximum 1662mer 24 hours. 30 capsule 1   magnesium chloride (SLOW-MAG) 64 MG TBEC SR tablet Take 1 tablet (64 mg total) by mouth daily. 30 tablet 1   ondansetron (ZOFRAN) 8 MG tablet Take 1 tablet (8 mg total) by mouth 2 (two) times daily as needed. Start on the third day after chemotherapy. 30 tablet 1   potassium chloride SA (KLOR-CON M20) 20 MEQ tablet Take 1 tablet (20 mEq total) by mouth daily. 90 tablet 3   prochlorperazine (COMPAZINE) 10 MG tablet Take 1 tablet (10 mg total) by mouth every 6 (six) hours as needed (Nausea or vomiting). 30 tablet 1   vitamin B-12 (CYANOCOBALAMIN) 1000 MCG tablet Take 1 tablet (1,000 mcg total) by mouth daily. 30 tablet 1   Vitamin D, Cholecalciferol, 50 MCG (2000 UT) CAPS Take 2,000 Units by mouth.     loratadine (CLARITIN) 10 MG tablet Take 10 mg by mouth daily.     metFORMIN (GLUCOPHAGE XR) 500 MG 24 hr tablet Take 2 tablets (1,000 mg total) by mouth daily with breakfast. (Patient not taking: Reported on 09/06/2021) 180 tablet 3   No current facility-administered medications for this visit.   Facility-Administered Medications Ordered in Other Visits  Medication Dose Route Frequency Provider Last Rate Last Admin   heparin lock flush 100 UNIT/ML injection            sodium chloride flush (NS) 0.9 % injection 10 mL  10 mL Intravenous PRN Presley Summerlin,Earlie ServerD   10 mL at 09/06/21 1140     PHYSICAL EXAMINATION: ECOG PERFORMANCE STATUS: 1 - Symptomatic but completely ambulatory Vitals:   09/06/21 0829   BP: 111/77  Pulse: (!) 112  Temp: 98.7 F (37.1 C)   Filed Weights   09/06/21 0829  Weight: 128 lb 9.6 oz (58.3 kg)    Physical Exam Constitutional:      General: She  is not in acute distress. HENT:     Head: Normocephalic and atraumatic.  Eyes:     General: No scleral icterus. Cardiovascular:     Rate and Rhythm: Normal rate and regular rhythm.     Heart sounds: Normal heart sounds.  Pulmonary:     Effort: Pulmonary effort is normal. No respiratory distress.     Breath sounds: No wheezing.  Abdominal:     General: Bowel sounds are normal. There is no distension.     Palpations: Abdomen is soft.  Musculoskeletal:        General: No deformity. Normal range of motion.     Cervical back: Normal range of motion and neck supple.  Skin:    General: Skin is warm and dry.     Findings: No erythema or rash.  Neurological:     Mental Status: She is alert and oriented to person, place, and time. Mental status is at baseline.     Comments: Chronic left upper and lower extremity weakness.  Psychiatric:        Mood and Affect: Mood normal.      LABORATORY DATA:  I have reviewed the data as listed Lab Results  Component Value Date   WBC 7.6 09/06/2021   HGB 9.5 (L) 09/06/2021   HCT 29.5 (L) 09/06/2021   MCV 96.4 09/06/2021   PLT 321 09/06/2021   Recent Labs    08/16/21 0752 08/30/21 0850 09/06/21 0808  NA 140 140 138  K 3.6 3.3* 3.0*  CL 112* 110 108  CO2 18* 21* 20*  GLUCOSE 131* 131* 129*  BUN _0 CREATININE 0.75 0.81 0.87  CALCIUM 9.5 9.1 8.5*  GFRNONAA >60 >60 >60  PROT 6.7 6.7 6.6  ALBUMIN 3.9 3.6 4.0  AST 12* 12* 21  ALT 13 11 34  ALKPHOS 99 86 56  BILITOT 0.5 0.9 0.5    Iron/TIBC/Ferritin/ %Sat    Component Value Date/Time   IRON 113 12/08/2019 1015   TIBC 317 12/08/2019 1015   FERRITIN 67 12/08/2019 1015   IRONPCTSAT 36 12/08/2019 1015       RADIOGRAPHIC STUDIES: I have personally reviewed the radiological images as listed and  agreed with the findings in the report. MR BREAST BILATERAL W [place] CAD  Result Date: 08/26/2021 CLINICAL DATA:  Evaluate treatment response. Patient has completed 5 weeks of chemotherapy. Ultrasound biopsy of a conglomerate of masses in the LEFT breast 12 o'clock 5 centimeters from nipple showed invasive mammary carcinoma. Biopsy of LEFT breast 11 o'clock location 10 centimeters from the nipple showed invasive mammary carcinoma. Additional masses in the LEFT breast identified with ultrasound were not biopsied. LABS:  None obtained at the time of imaging. EXAM: BILATERAL BREAST MRI WITH AND WITHOUT CONTRAST TECHNIQUE: Multiplanar, multisequence MR images of both breasts were obtained prior to and following the intravenous administration of 6 ml of Gadavist Three-dimensional MR images were rendered by post-processing of the original MR data on an independent workstation. The three-dimensional MR images were interpreted, and findings are reported in the following complete MRI report for this study. Three dimensional images were evaluated at the independent interpreting workstation using the DynaCAD thin client. COMPARISON:  MRI on 05/22/2021 FINDINGS: Breast composition: c. Heterogeneous fibroglandular tissue. Background parenchymal enhancement: Minimal Right breast: No mass or abnormal enhancement. Interval placement of RIGHT-sided Port-A-Cath. Left breast: Numerous masses and adjacent non mass enhancement in the UPPER LEFT breast spans 9.1 x 3.5 x 3.4 centimeters. Previously, mass  measured 8.3 x 4.2 x 4.0 centimeters. Mass shows rapid washout type enhancement kinetics. Non mass enhancement is again noted to extend to the LEFT pectoralis muscle, without pectoralis enhancement. Lymph nodes: No abnormal appearing lymph nodes. Ancillary findings:  None. IMPRESSION: 1. Masses and non mass enhancement in the UPPER LEFT breast, similar in appearance and size compared to prior study. 2. RIGHT breast remains  negative. RECOMMENDATION: Treatment plan for LEFT breast malignancy. BI-RADS CATEGORY  6: Known biopsy-proven malignancy. Electronically Signed   By: Nolon Nations M.D.   On: 08/26/2021 13:49  ECHOCARDIOGRAM COMPLETE  Result Date: 06/13/2021    ECHOCARDIOGRAM REPORT   Patient Name:   Tammy Nunez Date of Exam: 06/13/2021 Medical Rec #:  [id number]       Height:       61.0 in Accession #:    [id number]      Weight:       155.0 lb Date of Birth:  29-May-1951       BSA:          1.695 m Patient Age:    22 years        BP:           153/76 mmHg Patient Gender: F               HR:           76 bpm. Exam Location:  [facility] Procedure: 2D Echo, Color Doppler, Cardiac Doppler and Strain Analysis Indications:     Z09 Chemotherapy  History:         Patient has prior history of Echocardiogram examinations, most                  recent 06/07/2019. CAD, Stroke; Risk Factors:Diabetes and                  Hypertension. Invasive carcinoma of breast.  Sonographer:     Charmayne Sheer RDCS (AE) Referring Phys:  [id number] Esai Stecklein Diagnosing Phys: Ida Rogue MD  Sonographer Comments: Global longitudinal strain was attempted. IMPRESSIONS  1. Left ventricular ejection fraction, by estimation, is 60 to 65%. The left ventricle has normal function. The left ventricle has no regional wall motion abnormalities. Left ventricular diastolic parameters are consistent with Grade I diastolic dysfunction (impaired relaxation). The average left ventricular global longitudinal strain is -22.5 %. The global longitudinal strain is normal.  2. Right ventricular systolic function is normal. The right ventricular size is normal. Tricuspid regurgitation signal is inadequate for assessing PA pressure. FINDINGS  Left Ventricle: Left ventricular ejection fraction, by estimation, is 60 to 65%. The left ventricle has normal function. The left ventricle has no regional wall motion abnormalities. The average left ventricular global longitudinal strain is -22.5 %.  The global longitudinal strain is normal. The left ventricular internal cavity size was normal in size. There is no left ventricular hypertrophy. Left ventricular diastolic parameters are consistent with Grade I diastolic dysfunction (impaired relaxation). Right Ventricle: The right ventricular size is normal. No increase in right ventricular wall thickness. Right ventricular systolic function is normal. Tricuspid regurgitation signal is inadequate for assessing PA pressure. Left Atrium: Left atrial size was normal in size. Right Atrium: Right atrial size was normal in size. Pericardium: There is no evidence of pericardial effusion. Mitral Valve: The mitral valve is normal in structure. No evidence of mitral valve regurgitation. No evidence of mitral valve stenosis. MV peak gradient, 4.0 mmHg. The mean mitral valve gradient is 2.0 mmHg. Tricuspid  Valve: The tricuspid valve is normal in structure. Tricuspid valve regurgitation is not demonstrated. No evidence of tricuspid stenosis. Aortic Valve: The aortic valve was not well visualized. There is mild calcification of the aortic valve. Aortic valve regurgitation is not visualized. Mild aortic valve sclerosis is present, with no evidence of aortic valve stenosis. Aortic valve mean gradient measures 5.0 mmHg. Aortic valve peak gradient measures 9.1 mmHg. Aortic valve area, by VTI measures 2.47 cm. Pulmonic Valve: The pulmonic valve was normal in structure. Pulmonic valve regurgitation is not visualized. No evidence of pulmonic stenosis. Aorta: The aortic root is normal in size and structure. Venous: The inferior vena cava is normal in size with greater than 50% respiratory variability, suggesting right atrial pressure of 3 mmHg. IAS/Shunts: No atrial level shunt detected by color flow Doppler.  LEFT VENTRICLE PLAX 2D LVIDd:         3.50 cm  Diastology LVIDs:         2.60 cm  LV e' medial:    7.29 cm/s LV PW:         1.00 cm  LV E/e' medial:  10.3 LV IVS:        0.70 cm   LV e' lateral:   8.92 cm/s LVOT diam:     1.80 cm  LV E/e' lateral: 8.4 LV SV:         67 LV SV Index:   40       2D Longitudinal Strain LVOT Area:     2.54 cm 2D Strain GLS Avg:     -22.5 %  RIGHT VENTRICLE RV Basal diam:  3.00 cm TAPSE (M-mode): 2.4 cm LEFT ATRIUM             Index       RIGHT ATRIUM          Index LA diam:        3.00 cm 1.77 cm/m  RA Area:     7.18 cm LA Vol (A2C):   21.4 ml 12.63 ml/m RA Volume:   11.30 ml 6.67 ml/m LA Vol (A4C):   20.9 ml 12.33 ml/m LA Biplane Vol: 21.3 ml 12.57 ml/m  AORTIC VALVE                    PULMONIC VALVE AV Area (Vmax):    2.01 cm     PV Vmax:       0.90 m/s AV Area (Vmean):   2.20 cm     PV Vmean:      64.000 cm/s AV Area (VTI):     2.47 cm     PV VTI:        0.164 m AV Vmax:           151.00 cm/s  PV Peak grad:  3.2 mmHg AV Vmean:          105.000 cm/s PV Mean grad:  2.0 mmHg AV VTI:            0.273 m AV Peak Grad:      9.1 mmHg AV Mean Grad:      5.0 mmHg LVOT Vmax:         119.00 cm/s LVOT Vmean:        90.900 cm/s LVOT VTI:          0.265 m LVOT/AV VTI ratio: 0.97  AORTA Ao Root diam: 2.30 cm MITRAL VALVE MV Area (PHT): 3.01 cm     SHUNTS MV Area VTI:  2.69 cm     Systemic VTI:  0.26 m MV Peak grad:  4.0 mmHg     Systemic Diam: 1.80 cm MV Mean grad:  2.0 mmHg MV Vmax:       1.00 m/s MV Vmean:      61.8 cm/s MV Decel Time: 252 msec MV E velocity: 75.00 cm/s MV A velocity: 101.00 cm/s MV E/A ratio:  0.74 Ida Rogue MD Electronically signed by Ida Rogue MD Signature Date/Time: 06/13/2021/5:34:59 PM    Final        ASSESSMENT & PLAN:  1. Malignant neoplasm of left breast in female, estrogen receptor positive, unspecified site of breast ([place])   2. Encounter for antineoplastic chemotherapy   3. Thrombocytopenia ([place])   4. Antineoplastic chemotherapy induced anemia   5. Chemotherapy induced diarrhea   6. Hypokalemia   7. Hypomagnesemia   Cancer Staging Invasive carcinoma of breast ([place]) Staging form: Breast, AJCC 8th Edition -  Clinical stage from 05/16/2021: Stage IIB (cT3, cN0, cM0, G3, ER+, PR+, HER2-) - Signed by [name], MD on 06/03/2021  #Left breast invasive carcinoma, multiple sites.  ER 90%, PR 11-50% positive, HER2 negative by IHC cT3 N0 On neoadjuvant chemotherapy, finished 4 cycles of ddAC Interval MRI showed stable size. Labs reviewed and discussed with patient Hold Taxol due to diarrhea/tachycardia . #Chemotherapy-induced diarrhea,  Recommend Imodium as needed as instructed.  Instructions were discussed in details with patient. #Chemotherapy-induced anemia, stable hemoglobin  .   #Hypokalemia, patient is on potassium chloride 20 mEq daily.  IV potassium chloride 20 meq x 1 #Hypokalemia, patient will receive IV magnesium sulfate 4 g x 1.  Recommend patient to start slow magnesium 1 tablet daily.  Follow up in 1 week with lab MD Taxol.  All questions were answered. The patient knows to call the clinic with any problems questions or concerns.  cc McLean-Scocuzza, Olam Idler, MD, PhD 09/06/2021

## 2021-09-06 NOTE — Patient Instructions (Signed)
[facility] ONCOLOGY   Discharge Instructions: Thank you for choosing [facility] to provide your oncology and hematology care.  If you have a lab appointment with the [facility], please go directly to the [facility] and check in at the registration area.  Wear comfortable clothing and clothing appropriate for easy access to any Portacath or PICC line.   We strive to give you quality time with your provider. You may need to reschedule your appointment if you arrive late (15 or more minutes).  Arriving late affects you and other patients whose appointments are after yours.  Also, if you miss three or more appointments without notifying the office, you may be dismissed from the clinic at the provider's discretion.      For prescription refill requests, have your pharmacy contact our office and allow 72 hours for refills to be completed.    Today you received the following: Potassium Chloride and Magnesium Sulfate.      To help prevent nausea and vomiting after your treatment, we encourage you to take your nausea medication as directed.  BELOW ARE SYMPTOMS THAT SHOULD BE REPORTED IMMEDIATELY: *FEVER GREATER THAN 100.4 F (38 C) OR HIGHER *CHILLS OR SWEATING *NAUSEA AND VOMITING THAT IS NOT CONTROLLED WITH YOUR NAUSEA MEDICATION *UNUSUAL SHORTNESS OF BREATH *UNUSUAL BRUISING OR BLEEDING *URINARY PROBLEMS (pain or burning when urinating, or frequent urination) *BOWEL PROBLEMS (unusual diarrhea, constipation, pain near the anus) TENDERNESS IN MOUTH AND THROAT WITH OR WITHOUT PRESENCE OF ULCERS (sore throat, sores in mouth, or a toothache) UNUSUAL RASH, SWELLING OR PAIN  UNUSUAL VAGINAL DISCHARGE OR ITCHING   Items with * indicate a potential emergency and should be followed up as soon as possible or go to the Emergency Department if any problems should occur.  Please show the CHEMOTHERAPY ALERT CARD or IMMUNOTHERAPY ALERT CARD at check-in to the  Emergency Department and triage nurse.  Should you have questions after your visit or need to cancel or reschedule your appointment, please contact [facility]  [phone] and follow the prompts.  Office hours are 8:00 a.m. to 4:30 p.m. Monday - Friday. Please note that voicemails left after 4:00 p.m. may not be returned until the following business day.  We are closed weekends and major holidays. You have access to a nurse at all times for urgent questions. Please call the main number to the clinic [phone] and follow the prompts.  For any non-urgent questions, you may also contact your provider using MyChart. We now offer e-Visits for anyone 22 and older to request care online for non-urgent symptoms. For details visit [email or web address].   Also download the MyChart app! Go to the app store, search "MyChart", open the app, select [place] [facility], and log in with your MyChart username and password.  Due to Covid, a mask is required upon entering the hospital/clinic. If you do not have a mask, one will be given to you upon arrival. For doctor visits, patients may have 1 support person aged 45 or older with them. For treatment visits, patients cannot have anyone with them due to current Covid guidelines and our immunocompromised population.

## 2021-09-13 ENCOUNTER — Other Ambulatory Visit: Payer: Self-pay

## 2021-09-13 ENCOUNTER — Inpatient Hospital Stay (HOSPITAL_BASED_OUTPATIENT_CLINIC_OR_DEPARTMENT_OTHER): Payer: PPO | Admitting: Oncology

## 2021-09-13 ENCOUNTER — Inpatient Hospital Stay: Payer: PPO

## 2021-09-13 ENCOUNTER — Encounter: Payer: Self-pay | Admitting: Oncology

## 2021-09-13 VITALS — BP 134/65 | HR 80 | Temp 98.4°F | Wt 134.0 lb

## 2021-09-13 DIAGNOSIS — Z5111 Encounter for antineoplastic chemotherapy: Secondary | ICD-10-CM | POA: Diagnosis not present

## 2021-09-13 DIAGNOSIS — T451X5A Adverse effect of antineoplastic and immunosuppressive drugs, initial encounter: Secondary | ICD-10-CM | POA: Diagnosis not present

## 2021-09-13 DIAGNOSIS — E876 Hypokalemia: Secondary | ICD-10-CM

## 2021-09-13 DIAGNOSIS — D6481 Anemia due to antineoplastic chemotherapy: Secondary | ICD-10-CM | POA: Diagnosis not present

## 2021-09-13 DIAGNOSIS — C50912 Malignant neoplasm of unspecified site of left female breast: Secondary | ICD-10-CM

## 2021-09-13 HISTORY — DX: Hypomagnesemia: E83.42

## 2021-09-13 LAB — COMPREHENSIVE METABOLIC PANEL
ALT: 19 U/L (ref 0–44)
AST: 19 U/L (ref 15–41)
Albumin: 3.6 g/dL (ref 3.5–5.0)
Alkaline Phosphatase: 50 U/L (ref 38–126)
Anion gap: 11 (ref 5–15)
BUN: 12 mg/dL (ref 8–23)
CO2: 21 mmol/L — ABNORMAL LOW (ref 22–32)
Calcium: 9.4 mg/dL (ref 8.9–10.3)
Chloride: 106 mmol/L (ref 98–111)
Creatinine, Ser: 0.8 mg/dL (ref 0.44–1.00)
GFR, Estimated: >60 mL/min (ref >60)
Glucose, Bld: 126 mg/dL — ABNORMAL HIGH (ref 70–99)
Potassium: 3.4 mmol/L — ABNORMAL LOW (ref 3.5–5.1)
Sodium: 138 mmol/L (ref 135–145)
Total Bilirubin: 0.8 mg/dL (ref 0.3–1.2)
Total Protein: 6.3 g/dL — ABNORMAL LOW (ref 6.5–8.1)

## 2021-09-13 LAB — CBC WITH DIFFERENTIAL/PLATELET
Abs Immature Granulocytes: 0.14 10*3/uL — ABNORMAL HIGH (ref 0.00–0.07)
Basophils Absolute: 0 10*3/uL (ref 0.0–0.1)
Basophils Relative: 1 %
Eosinophils Absolute: 0 10*3/uL (ref 0.0–0.5)
Eosinophils Relative: 1 %
HCT: 30.5 % — ABNORMAL LOW (ref 36.0–46.0)
Hemoglobin: 9.4 g/dL — ABNORMAL LOW (ref 12.0–15.0)
Immature Granulocytes: 2 %
Lymphocytes Relative: 16 %
Lymphs Abs: 1 10*3/uL (ref 0.7–4.0)
MCH: 30.8 pg (ref 26.0–34.0)
MCHC: 30.8 g/dL (ref 30.0–36.0)
MCV: 100 fL (ref 80.0–100.0)
Monocytes Absolute: 1 10*3/uL (ref 0.1–1.0)
Monocytes Relative: 17 %
Neutro Abs: 3.8 10*3/uL (ref 1.7–7.7)
Neutrophils Relative %: 63 %
Platelets: 245 10*3/uL (ref 150–400)
RBC: 3.05 MIL/uL — ABNORMAL LOW (ref 3.87–5.11)
RDW: 18.6 % — ABNORMAL HIGH (ref 11.5–15.5)
WBC: 5.9 10*3/uL (ref 4.0–10.5)
nRBC: 2 % — ABNORMAL HIGH (ref 0.0–0.2)

## 2021-09-13 LAB — MAGNESIUM: Magnesium: 1.5 mg/dL — ABNORMAL LOW (ref 1.7–2.4)

## 2021-09-13 MED ORDER — FAMOTIDINE 20 MG IN NS 100 ML IVPB
20.0000 mg | Freq: Once | INTRAVENOUS | Status: AC
  Administered 2021-09-13: 20 mg via INTRAVENOUS
  Filled 2021-09-13: qty 20

## 2021-09-13 MED ORDER — MAGNESIUM SULFATE 2 GM/50ML IV SOLN
2.0000 g | Freq: Once | INTRAVENOUS | Status: AC
  Administered 2021-09-13: 2 g via INTRAVENOUS
  Filled 2021-09-13: qty 50

## 2021-09-13 MED ORDER — POTASSIUM CHLORIDE 20 MEQ/100ML IV SOLN
20.0000 meq | Freq: Once | INTRAVENOUS | Status: AC
  Administered 2021-09-13: 20 meq via INTRAVENOUS

## 2021-09-13 MED ORDER — HEPARIN SOD (PORK) LOCK FLUSH 100 UNIT/ML IV SOLN
500.0000 [IU] | Freq: Once | INTRAVENOUS | Status: AC | PRN
  Administered 2021-09-13: 500 [IU]
  Filled 2021-09-13: qty 5

## 2021-09-13 MED ORDER — SODIUM CHLORIDE 0.9 % IV SOLN
Freq: Once | INTRAVENOUS | Status: AC
Start: 2021-09-13 — End: 2021-09-13
  Filled 2021-09-13: qty 250

## 2021-09-13 MED ORDER — DIPHENHYDRAMINE HCL 50 MG/ML IJ SOLN
50.0000 mg | Freq: Once | INTRAMUSCULAR | Status: AC
  Administered 2021-09-13: 50 mg via INTRAVENOUS
  Filled 2021-09-13: qty 1

## 2021-09-13 MED ORDER — SODIUM CHLORIDE 0.9 % IV SOLN
138.0000 mg | Freq: Once | INTRAVENOUS | Status: AC
  Administered 2021-09-13: 138 mg via INTRAVENOUS
  Filled 2021-09-13: qty 23

## 2021-09-13 MED ORDER — SODIUM CHLORIDE 0.9 % IV SOLN
10.0000 mg | Freq: Once | INTRAVENOUS | Status: AC
  Administered 2021-09-13: 10 mg via INTRAVENOUS
  Filled 2021-09-13: qty 10

## 2021-09-13 MED ORDER — SODIUM CHLORIDE 0.9 % IV SOLN: Freq: Once | INTRAVENOUS | Status: DC

## 2021-09-13 NOTE — Progress Notes (Signed)
Hematology/Oncology Progress note [facility] Telephone:([phone] [phone]   Patient Care Team: McLean-Scocuzza, Nino Glow, MD as PCP - General (Internal Medicine) De Hollingshead, [place] as Pharmacist (Pharmacist) Theodore Demark, RN as Oncology Nurse Navigator  REFERRING PROVIDER: McLean-Scocuzza, Olivia Mackie *  CHIEF COMPLAINTS/REASON FOR VISIT:  multifocal left breast cancer  HISTORY OF PRESENTING ILLNESS:   Tammy Nunez is a  70 y.o.  female with PMH listed below was seen in consultation at the request of  McLean-Scocuzza, Olivia Mackie *  for evaluation of multifocal left breast cancer.   04/24/2021, screening mammogram showed large asymmetry in the superior aspect of the breast with associated calcifications and possible masses and a subtle distortions.  No suspicious findings for malignancy right breast.  05/02/2021 left breast diagnostic mammogram and ultrasound showed multiple irregular mass in the left breast. Mammogram mass 1 irregular mass associated with distortion in the upper breast at middle depth, multiple adjacent and possible continuous irregular masses, conglomeration of masses measures at least 5.6 cm.  Associated pleomorphic calcifications extending at least 5 x 3.9 x 3.7 cm Mass 2, 1 cm mass in the upper breast at the middle to posterior depth. Mass 3, 5 mm irregular mass in the upper breast at posterior depth. In total, mass 1-mass 3 span at least 8.2 cm in anterior-posterior extent.  By ultrasound  mass 1 12:00 5 cm from nipple, 2.7 x 1.5 x 4.3 cm Mass 2, 12:00 12 cm from nipple, 0.9 x 0.9 x 0.5 cm Mass 3   12:00 14 cm from nipple, 0.4 x 0.4 x 0.4 cm-uses 2.4 cm superior to mass to Mass 4   11:00  No suspicious left axillary adenopathy.   05/07/2021 -Ultrasound-guided breast biopsy Breast left 12:00 mass 5 cm from nipple biopsy showed invasive mammary carcinoma, no special type, grade 3, high-grade DCIS present, no LVI, ER 90%, PR  11-50%, HER2 negative by IHC Breast left 11:00 mass 10 cm from nipple biopsy showed invasive mammary carcinoma, no special type, grade 2, no DCIS/LVI identified.ER 90%, PR 11-50%, HER2 negative by IHC  Patient is married has no children.  Denies any hormone replacement, oral contraceptive use, family history of breast cancer.  Denies any chest radiation. Patient has a history of stroke with chronic residual weakness of the left side.  She walks with a cane.  05/22/2021 MRI breast showed that nonmass like malignancy spanning over 8 cm, abuting anterior aspect of pectoralis muscle. Discussed with Dr.Cintron and we agree that she will need mastectomy. There is concern of possible positive margin. I recommend neoadjuvant chemotherapy  # medi port placed on 06/06/2021. 06/13/2021 pre chemotherapy Echo. LVEF 60-65%.  Normal global longitudinal strain. Grade 1 diastolic dysfunction.   # 05/22/2021 MRI breast showed  Biopsy-proven malignancy within the UPPER LEFT breast spanning a distance of 8.3 x 4.2 x 4 cm nonmass like enhancement abuts the anterior aspect of the LEFT pectoralis muscle without gross invasion. Oncotype Dx 27. Discussed with Dr.Cintron, there is concern of positive margin.   INTERVAL HISTORY Tammy Nunez is a 70 y.o. female who has above history reviewed by me today presents for follow up visit for chemotherapy of breast cancer Patient was accompanied by her husband. Diarrhea has improved.  She has not needed to use Imodium.  She has medication available at home and knows the instructions. Denies any fever or chills. Denies any numbness or tingling.   Review of Systems  Constitutional:  Positive for fatigue. Negative for appetite change,  chills and fever.  HENT:   Negative for hearing loss and voice change.   Eyes:  Negative for eye problems.  Respiratory:  Negative for chest tightness and cough.   Cardiovascular:  Negative for chest pain.  Gastrointestinal:  Negative for  abdominal distention, abdominal pain, blood in stool, diarrhea and nausea.  Endocrine: Negative for hot flashes.  Genitourinary:  Negative for difficulty urinating and frequency.   Musculoskeletal:  Negative for arthralgias.  Skin:  Negative for itching and rash.  Neurological:  Negative for extremity weakness.  Hematological:  Negative for adenopathy. Does not bruise/bleed easily.  Psychiatric/Behavioral:  Negative for confusion.    MEDICAL HISTORY:  Past Medical History:  Diagnosis Date   Breast cancer in female [facility]) 05/08/2021   Chronic left shoulder pain    Coronary artery disease    Diabetes mellitus without complication ([place])    Dysrhythmia    Hypertension    Hypomagnesemia 09/13/2021   Paralysis ([place])    weakness left upper amd lower extremity   PONV (postoperative nausea and vomiting)    Stroke [facility])     SURGICAL HISTORY: Past Surgical History:  Procedure Laterality Date   BREAST BIOPSY Left 05/07/2021   12:00 5cmfn vision clip path pending   BREAST BIOPSY Left 05/07/2021   11:00 10 cmfn mini cork clip path pending   CAROTID PTA/STENT INTERVENTION Left 07/20/2019   Procedure: CAROTID PTA/STENT INTERVENTION;  Surgeon: Katha Cabal, MD;  Location: [place] [facility];  Service: Cardiovascular;  Laterality: Left;   ECTOPIC PREGNANCY SURGERY     PORTACATH PLACEMENT N/A 06/06/2021   Procedure: INSERTION PORT-A-CATH;  Surgeon: Herbert Pun, MD;  Location: [facility] ORS;  Service: General;  Laterality: N/A;    SOCIAL HISTORY: Social History   Socioeconomic History   Marital status: Married    Spouse name: Not on file   Number of children: Not on file   Years of education: Not on file   Highest education level: Not on file  Occupational History   Not on file  Tobacco Use   Smoking status: Former    Packs/day: 0.50    Years: 15.00    Pack years: 7.50    Types: Cigarettes    Quit date: 04/11/2019    Years since quitting: 2.4   Smokeless tobacco: Never   Vaping Use   Vaping Use: Never used  Substance and Sexual Activity   Alcohol use: Never   Drug use: Never   Sexual activity: Not Currently  Other Topics Concern   Not on file  Social History Narrative   No kids    Married with husband    Used to work [employer] broadcast assistant    Social Determinants of Radio broadcast assistant Strain: Low Risk    Difficulty of Paying Living Expenses: Not hard at all  Food Insecurity: Not on file  Transportation Needs: Not on file  Physical Activity: Sufficiently Active   Days of Exercise per Week: 7 days   Minutes of Exercise per Session: 30 min  Stress: Not on file  Social Connections: Not on file  Intimate Partner Violence: Not on file    FAMILY HISTORY: Family History  Problem Relation Age of Onset   Diabetes type II Mother    Hypertension Father    Heart attack Father    Diabetes type II Brother    Breast cancer Neg Hx     ALLERGIES:  has No Known Allergies.  MEDICATIONS:  Current Outpatient Medications  Medication Sig Dispense Refill   amLODipine (NORVASC) 10 MG tablet Take 1 tablet by mouth once daily 90 tablet 0   aspirin EC 81 MG EC tablet Take 1 tablet (81 mg total) by mouth daily.     atorvastatin (LIPITOR) 80 MG tablet Take 1 tablet (80 mg total) by mouth daily. 90 tablet 3   blood glucose meter kit and supplies 1 each by Other route 4 (four) times daily. Dispense based on patient and insurance preference. Four times daily as directed. (FOR ICD-10 E10.9, E11.9). 1 each 0   carvedilol (COREG) 25 MG tablet Take 1 tablet (25 mg total) by mouth 2 (two) times daily with a meal. 180 tablet 3   clopidogrel (PLAVIX) 75 MG tablet Take 1 tablet (75 mg total) by mouth daily. Further refills [facility] neurology 90 tablet 3   Continuous Blood Gluc Sensor (FREESTYLE LIBRE 2 SENSOR) MISC Use to check glucose at least every 8 hours 6 each 3   empagliflozin (JARDIANCE) 25 MG TABS tablet Take 1 tablet (25 mg total) by mouth daily. 90  tablet 3   lidocaine-prilocaine (EMLA) cream Apply to affected area once 30 g 3   lisinopril (ZESTRIL) 40 MG tablet Take 1 tablet (40 mg total) by mouth daily. 90 tablet 3   loperamide (IMODIUM) 2 MG capsule Take 1 capsule (2 mg total) by mouth See admin instructions. Take 21m at the onset of diarrhea, and then 264mafter each loose bowel movement, maximum 1664mer 24 hours. 30 capsule 1   loratadine (CLARITIN) 10 MG tablet Take 10 mg by mouth daily.     magnesium chloride (SLOW-MAG) 64 MG TBEC SR tablet Take 1 tablet (64 mg total) by mouth daily. 30 tablet 1   ondansetron (ZOFRAN) 8 MG tablet Take 1 tablet (8 mg total) by mouth 2 (two) times daily as needed. Start on the third day after chemotherapy. 30 tablet 1   potassium chloride SA (KLOR-CON M20) 20 MEQ tablet Take 1 tablet (20 mEq total) by mouth daily. 90 tablet 3   prochlorperazine (COMPAZINE) 10 MG tablet Take 1 tablet (10 mg total) by mouth every 6 (six) hours as needed (Nausea or vomiting). 30 tablet 1   vitamin B-12 (CYANOCOBALAMIN) 1000 MCG tablet Take 1 tablet (1,000 mcg total) by mouth daily. 30 tablet 1   Vitamin D, Cholecalciferol, 50 MCG (2000 UT) CAPS Take 2,000 Units by mouth.     metFORMIN (GLUCOPHAGE XR) 500 MG 24 hr tablet Take 2 tablets (1,000 mg total) by mouth daily with breakfast. (Patient not taking: No sig reported) 180 tablet 3   No current facility-administered medications for this visit.   Facility-Administered Medications Ordered in Other Visits  Medication Dose Route Frequency Provider Last Rate Last Admin   heparin lock flush 100 UNIT/ML injection              PHYSICAL EXAMINATION: ECOG PERFORMANCE STATUS: 1 - Symptomatic but completely ambulatory Vitals:   09/13/21 0843  BP: 134/65  Pulse: 80  Temp: 98.4 F (36.9 C)   Filed Weights   09/13/21 0843  Weight: 134 lb (60.8 kg)    Physical Exam Constitutional:      General: She is not in acute distress. HENT:     Head: Normocephalic and atraumatic.   Eyes:     General: No scleral icterus. Cardiovascular:     Rate and Rhythm: Normal rate and regular rhythm.     Heart sounds: Normal heart sounds.  Pulmonary:  Effort: Pulmonary effort is normal. No respiratory distress.     Breath sounds: No wheezing.  Abdominal:     General: Bowel sounds are normal. There is no distension.     Palpations: Abdomen is soft.  Musculoskeletal:        General: No deformity. Normal range of motion.     Cervical back: Normal range of motion and neck supple.  Skin:    General: Skin is warm and dry.     Findings: No erythema or rash.  Neurological:     Mental Status: She is alert and oriented to person, place, and time. Mental status is at baseline.     Comments: Chronic left upper and lower extremity weakness.  Psychiatric:        Mood and Affect: Mood normal.      LABORATORY DATA:  I have reviewed the data as listed Lab Results  Component Value Date   WBC 5.9 09/13/2021   HGB 9.4 (L) 09/13/2021   HCT 30.5 (L) 09/13/2021   MCV 100.0 09/13/2021   PLT 245 09/13/2021   Recent Labs    08/30/21 0850 09/06/21 0808 09/13/21 0823  NA 140 138 138  K 3.3* 3.0* 3.4*  CL 110 108 106  CO2 21* 20* 21*  GLUCOSE 131* 129* 126*  BUN 12 21 12   CREATININE 0.81 0.87 0.80  CALCIUM 9.1 8.5* 9.4  GFRNONAA >60 >60 >60  PROT 6.7 6.6 6.3*  ALBUMIN 3.6 4.0 3.6  AST 12* 21 19  ALT 11 34 19  ALKPHOS 86 56 50  BILITOT 0.9 0.5 0.8    Iron/TIBC/Ferritin/ %Sat    Component Value Date/Time   IRON 113 12/08/2019 1015   TIBC 317 12/08/2019 1015   FERRITIN 67 12/08/2019 1015   IRONPCTSAT 36 12/08/2019 1015       RADIOGRAPHIC STUDIES: I have personally reviewed the radiological images as listed and agreed with the findings in the report. MR BREAST BILATERAL W [place] CAD  Result Date: 08/26/2021 CLINICAL DATA:  Evaluate treatment response. Patient has completed 5 weeks of chemotherapy. Ultrasound biopsy of a conglomerate of masses in the LEFT  breast 12 o'clock 5 centimeters from nipple showed invasive mammary carcinoma. Biopsy of LEFT breast 11 o'clock location 10 centimeters from the nipple showed invasive mammary carcinoma. Additional masses in the LEFT breast identified with ultrasound were not biopsied. LABS:  None obtained at the time of imaging. EXAM: BILATERAL BREAST MRI WITH AND WITHOUT CONTRAST TECHNIQUE: Multiplanar, multisequence MR images of both breasts were obtained prior to and following the intravenous administration of 6 ml of Gadavist Three-dimensional MR images were rendered by post-processing of the original MR data on an independent workstation. The three-dimensional MR images were interpreted, and findings are reported in the following complete MRI report for this study. Three dimensional images were evaluated at the independent interpreting workstation using the DynaCAD thin client. COMPARISON:  MRI on 05/22/2021 FINDINGS: Breast composition: c. Heterogeneous fibroglandular tissue. Background parenchymal enhancement: Minimal Right breast: No mass or abnormal enhancement. Interval placement of RIGHT-sided Port-A-Cath. Left breast: Numerous masses and adjacent non mass enhancement in the UPPER LEFT breast spans 9.1 x 3.5 x 3.4 centimeters. Previously, mass measured 8.3 x 4.2 x 4.0 centimeters. Mass shows rapid washout type enhancement kinetics. Non mass enhancement is again noted to extend to the LEFT pectoralis muscle, without pectoralis enhancement. Lymph nodes: No abnormal appearing lymph nodes. Ancillary findings:  None. IMPRESSION: 1. Masses and non mass enhancement in the UPPER LEFT  breast, similar in appearance and size compared to prior study. 2. RIGHT breast remains negative. RECOMMENDATION: Treatment plan for LEFT breast malignancy. BI-RADS CATEGORY  6: Known biopsy-proven malignancy. Electronically Signed   By: Nolon Nations M.D.   On: 08/26/2021 13:49      ASSESSMENT & PLAN:  1. Malignant neoplasm of left female  breast, unspecified estrogen receptor status, unspecified site of breast ([place])   2. Encounter for antineoplastic chemotherapy   3. Hypokalemia   4. Antineoplastic chemotherapy induced anemia   5. Hypomagnesemia   Cancer Staging Invasive carcinoma of breast ([place]) Staging form: Breast, AJCC 8th Edition - Clinical stage from 05/16/2021: Stage IIB (cT3, cN0, cM0, G3, ER+, PR+, HER2-) - Signed by [name], MD on 06/03/2021  #Left breast invasive carcinoma, multiple sites.  ER 90%, PR 11-50% positive, HER2 negative by IHC cT3 N0 On neoadjuvant chemotherapy, finished 4 cycles of ddAC Interval MRI showed stable size. Labs reviewed and discussed with patient .  Proceed with Taxol today. Marland Kitchen #Chemotherapy-induced diarrhea, resolved.  She has Imodium at home and knows how to use it.   #Chemotherapy-induced anemia, stable hemoglobin  .   #Hypokalemia, recommendation to take potassium chloride 20 mEq twice daily for now.  IV potassium chloride 20 meq x 1 #Hypokalemia, patient will receive IV magnesium sulfate 2 g x 1.  Recommend patient to start slow magnesium 1 tablet daily.  Follow up in 1 week with lab MD Taxol.  All questions were answered. The patient knows to call the clinic with any problems questions or concerns.  cc McLean-Scocuzza, Olam Idler, MD, PhD 09/13/2021

## 2021-09-13 NOTE — Progress Notes (Signed)
Nutrition Follow-up:  Patient with left breast cancer.  Patient receiving taxol.    Met with patient during infusion.  Says that appetite is much better than before.  Eating 3 meals per day with snacks in between.  Snacks on peanut butter crackers or potato chips.  Drinks boost shakes about 3 times per week.  Husband usually cooks lunch time meal of meat and couple sides.  Has taste alterations.     Medications: reviewed  Labs: glucose 126, K 3.4  Anthropometrics:   Weight 134 lb, stable  134 lb on 9/23 140 lb on 9/9 155 lb on 7/14   NUTRITION DIAGNOSIS: Inadequate oral intake continues    INTERVENTION:  Encouraged patient to continue eating high calorie, high protein foods to maintain weight Discussed strategies to help with taste change. Handout given    MONITORING, EVALUATION, GOAL: weight trends, intake   NEXT VISIT: as needed with treatment  Joli B. Allen, RD, LDN Registered Dietitian [phone] (mobile)   

## 2021-09-13 NOTE — Patient Instructions (Signed)
[facility] [place] [facility]  Discharge Instructions: Thank you for choosing [facility] to provide your oncology and hematology care.  If you have a lab appointment with the Cancer Center, please go directly to the Cancer Center and check in at the registration area.  Wear comfortable clothing and clothing appropriate for easy access to any Portacath or PICC line.   We strive to give you quality time with your provider. You may need to reschedule your appointment if you arrive late (15 or more minutes).  Arriving late affects you and other patients whose appointments are after yours.  Also, if you miss three or more appointments without notifying the office, you may be dismissed from the clinic at the provider's discretion.      For prescription refill requests, have your pharmacy contact our office and allow 72 hours for refills to be completed.      To help prevent nausea and vomiting after your treatment, we encourage you to take your nausea medication as directed.  BELOW ARE SYMPTOMS THAT SHOULD BE REPORTED IMMEDIATELY: *FEVER GREATER THAN 100.4 F (38 C) OR HIGHER *CHILLS OR SWEATING *NAUSEA AND VOMITING THAT IS NOT CONTROLLED WITH YOUR NAUSEA MEDICATION *UNUSUAL SHORTNESS OF BREATH *UNUSUAL BRUISING OR BLEEDING *URINARY PROBLEMS (pain or burning when urinating, or frequent urination) *BOWEL PROBLEMS (unusual diarrhea, constipation, pain near the anus) TENDERNESS IN MOUTH AND THROAT WITH OR WITHOUT PRESENCE OF ULCERS (sore throat, sores in mouth, or a toothache) UNUSUAL RASH, SWELLING OR PAIN  UNUSUAL VAGINAL DISCHARGE OR ITCHING   Items with * indicate a potential emergency and should be followed up as soon as possible or go to the Emergency Department if any problems should occur.  Please show the CHEMOTHERAPY ALERT CARD or IMMUNOTHERAPY ALERT CARD at check-in to the Emergency Department and triage nurse.  Should you have questions after your  visit or need to cancel or reschedule your appointment, please contact [facility] [place] [facility]  [phone] and follow the prompts.  Office hours are 8:00 a.m. to 4:30 p.m. Monday - Friday. Please note that voicemails left after 4:00 p.m. may not be returned until the following business day.  We are closed weekends and major holidays. You have access to a nurse at all times for urgent questions. Please call the main number to the clinic [phone] and follow the prompts.  For any non-urgent questions, you may also contact your provider using MyChart. We now offer e-Visits for anyone 18 and older to request care online for non-urgent symptoms. For details visit [email or web address].   Also download the MyChart app! Go to the app store, search "MyChart", open the app, select [place], and log in with your MyChart username and password.  Due to Covid, a mask is required upon entering the hospital/clinic. If you do not have a mask, one will be given to you upon arrival. For doctor visits, patients may have 1 support person aged 18 or older with them. For treatment visits, patients cannot have anyone with them due to current Covid guidelines and our immunocompromised population.  

## 2021-09-20 ENCOUNTER — Inpatient Hospital Stay: Payer: PPO

## 2021-09-20 ENCOUNTER — Other Ambulatory Visit: Payer: Self-pay

## 2021-09-20 ENCOUNTER — Encounter: Payer: Self-pay | Admitting: Oncology

## 2021-09-20 ENCOUNTER — Inpatient Hospital Stay (HOSPITAL_BASED_OUTPATIENT_CLINIC_OR_DEPARTMENT_OTHER): Payer: PPO | Admitting: Oncology

## 2021-09-20 VITALS — BP 107/63 | HR 89 | Temp 98.9°F | Wt 124.8 lb

## 2021-09-20 DIAGNOSIS — C50912 Malignant neoplasm of unspecified site of left female breast: Secondary | ICD-10-CM | POA: Diagnosis not present

## 2021-09-20 DIAGNOSIS — R7989 Other specified abnormal findings of blood chemistry: Secondary | ICD-10-CM

## 2021-09-20 DIAGNOSIS — Z170 Estrogen receptor positive status [ER+]: Secondary | ICD-10-CM | POA: Diagnosis not present

## 2021-09-20 DIAGNOSIS — Z95828 Presence of other vascular implants and grafts: Secondary | ICD-10-CM

## 2021-09-20 DIAGNOSIS — Z9181 History of falling: Secondary | ICD-10-CM

## 2021-09-20 DIAGNOSIS — T451X5A Adverse effect of antineoplastic and immunosuppressive drugs, initial encounter: Secondary | ICD-10-CM

## 2021-09-20 DIAGNOSIS — D6481 Anemia due to antineoplastic chemotherapy: Secondary | ICD-10-CM | POA: Diagnosis not present

## 2021-09-20 DIAGNOSIS — Z5111 Encounter for antineoplastic chemotherapy: Secondary | ICD-10-CM | POA: Diagnosis not present

## 2021-09-20 DIAGNOSIS — E876 Hypokalemia: Secondary | ICD-10-CM

## 2021-09-20 LAB — CBC WITH DIFFERENTIAL/PLATELET
Abs Immature Granulocytes: 0.03 10*3/uL (ref 0.00–0.07)
Basophils Absolute: 0 10*3/uL (ref 0.0–0.1)
Basophils Relative: 1 %
Eosinophils Absolute: 0 10*3/uL (ref 0.0–0.5)
Eosinophils Relative: 0 %
HCT: 32.7 % — ABNORMAL LOW (ref 36.0–46.0)
Hemoglobin: 10.3 g/dL — ABNORMAL LOW (ref 12.0–15.0)
Immature Granulocytes: 1 %
Lymphocytes Relative: 21 %
Lymphs Abs: 1 10*3/uL (ref 0.7–4.0)
MCH: 30.8 pg (ref 26.0–34.0)
MCHC: 31.5 g/dL (ref 30.0–36.0)
MCV: 97.9 fL (ref 80.0–100.0)
Monocytes Absolute: 0.3 10*3/uL (ref 0.1–1.0)
Monocytes Relative: 7 %
Neutro Abs: 3.2 10*3/uL (ref 1.7–7.7)
Neutrophils Relative %: 70 %
Platelets: 250 10*3/uL (ref 150–400)
RBC: 3.34 MIL/uL — ABNORMAL LOW (ref 3.87–5.11)
RDW: 16.9 % — ABNORMAL HIGH (ref 11.5–15.5)
WBC: 4.6 10*3/uL (ref 4.0–10.5)
nRBC: 0.4 % — ABNORMAL HIGH (ref 0.0–0.2)

## 2021-09-20 LAB — COMPREHENSIVE METABOLIC PANEL
ALT: 23 U/L (ref 0–44)
AST: 15 U/L (ref 15–41)
Albumin: 4 g/dL (ref 3.5–5.0)
Alkaline Phosphatase: 51 U/L (ref 38–126)
Anion gap: 8 (ref 5–15)
BUN: 24 mg/dL — ABNORMAL HIGH (ref 8–23)
CO2: 20 mmol/L — ABNORMAL LOW (ref 22–32)
Calcium: 9.4 mg/dL (ref 8.9–10.3)
Chloride: 108 mmol/L (ref 98–111)
Creatinine, Ser: 1.05 mg/dL — ABNORMAL HIGH (ref 0.44–1.00)
GFR, Estimated: 57 mL/min — ABNORMAL LOW (ref >60)
Glucose, Bld: 137 mg/dL — ABNORMAL HIGH (ref 70–99)
Potassium: 4.7 mmol/L (ref 3.5–5.1)
Sodium: 136 mmol/L (ref 135–145)
Total Bilirubin: 0.8 mg/dL (ref 0.3–1.2)
Total Protein: 6.6 g/dL (ref 6.5–8.1)

## 2021-09-20 LAB — MAGNESIUM: Magnesium: 1.9 mg/dL (ref 1.7–2.4)

## 2021-09-20 MED ORDER — SODIUM CHLORIDE 0.9% FLUSH
10.0000 mL | Freq: Once | INTRAVENOUS | Status: AC
  Administered 2021-09-20: 10 mL via INTRAVENOUS
  Filled 2021-09-20: qty 10

## 2021-09-20 MED ORDER — SODIUM CHLORIDE 0.9 % IV SOLN
Freq: Once | INTRAVENOUS | Status: AC
  Filled 2021-09-20: qty 250

## 2021-09-20 NOTE — Patient Instructions (Signed)
[facility]  Discharge Instructions: Thank you for choosing [facility] to provide your oncology and hematology care.  If you have a lab appointment with the [facility], please go directly to the [facility] and check in at the registration area.  Wear comfortable clothing and clothing appropriate for easy access to any Portacath or PICC line.   We strive to give you quality time with your provider. You may need to reschedule your appointment if you arrive late (15 or more minutes).  Arriving late affects you and other patients whose appointments are after yours.  Also, if you miss three or more appointments without notifying the office, you may be dismissed from the clinic at the provider's discretion.      For prescription refill requests, have your pharmacy contact our office and allow 72 hours for refills to be completed.    Today you received the following chemotherapy and/or immunotherapy agents IV fluids - hydration      To help prevent nausea and vomiting after your treatment, we encourage you to take your nausea medication as directed.  BELOW ARE SYMPTOMS THAT SHOULD BE REPORTED IMMEDIATELY: *FEVER GREATER THAN 100.4 F (38 C) OR HIGHER *CHILLS OR SWEATING *NAUSEA AND VOMITING THAT IS NOT CONTROLLED WITH YOUR NAUSEA MEDICATION *UNUSUAL SHORTNESS OF BREATH *UNUSUAL BRUISING OR BLEEDING *URINARY PROBLEMS (pain or burning when urinating, or frequent urination) *BOWEL PROBLEMS (unusual diarrhea, constipation, pain near the anus) TENDERNESS IN MOUTH AND THROAT WITH OR WITHOUT PRESENCE OF ULCERS (sore throat, sores in mouth, or a toothache) UNUSUAL RASH, SWELLING OR PAIN  UNUSUAL VAGINAL DISCHARGE OR ITCHING   Items with * indicate a potential emergency and should be followed up as soon as possible or go to the Emergency Department if any problems should occur.  Please show the CHEMOTHERAPY ALERT CARD or IMMUNOTHERAPY ALERT CARD  at check-in to the Emergency Department and triage nurse.  Should you have questions after your visit or need to cancel or reschedule your appointment, please contact [facility]  [phone] and follow the prompts.  Office hours are 8:00 a.m. to 4:30 p.m. Monday - Friday. Please note that voicemails left after 4:00 p.m. may not be returned until the following business day.  We are closed weekends and major holidays. You have access to a nurse at all times for urgent questions. Please call the main number to the clinic [phone] and follow the prompts.  For any non-urgent questions, you may also contact your provider using MyChart. We now offer e-Visits for anyone 72 and older to request care online for non-urgent symptoms. For details visit [email or web address].   Also download the MyChart app! Go to the app store, search "MyChart", open the app, select [facility], and log in with your MyChart username and password.  Due to Covid, a mask is required upon entering the hospital/clinic. If you do not have a mask, one will be given to you upon arrival. For doctor visits, patients may have 1 support person aged 19 or older with them. For treatment visits, patients cannot have anyone with them due to current Covid guidelines and our immunocompromised population.   Dehydration, Adult Dehydration is condition in which there is not enough water or other fluids in the body. This happens when a person loses more fluids than he or she takes in. Important body parts cannot work right without the right amount of fluids. Any loss of fluids from the body can  cause dehydration. Dehydration can be mild, worse, or very bad. It should be treated right away to keep it from getting very bad. What are the causes? This condition may be caused by: Conditions that cause loss of water or other fluids, such as: Watery poop (diarrhea). Vomiting. Sweating a lot. Peeing (urinating) a  lot. Not drinking enough fluids, especially when you: Are ill. Are doing things that take a lot of energy to do. Other illnesses and conditions, such as fever or infection. Certain medicines, such as medicines that take extra fluid out of the body (diuretics). Lack of safe drinking water. Not being able to get enough water and food. What increases the risk? The following factors may make you more likely to develop this condition: Having a long-term (chronic) illness that has not been treated the right way, such as: Diabetes. Heart disease. Kidney disease. Being 39 years of age or older. Having a disability. Living in a place that is high above the ground or sea (high in altitude). The thinner, dried air causes more fluid loss. Doing exercises that put stress on your body for a long time. What are the signs or symptoms? Symptoms of dehydration depend on how bad it is. Mild or worse dehydration Thirst. Dry lips or dry mouth. Feeling dizzy or light-headed, especially when you stand up from sitting. Muscle cramps. Your body making: Dark pee (urine). Pee may be the color of tea. Less pee than normal. Less tears than normal. Headache. Very bad dehydration Changes in skin. Skin may: Be cold to the touch (clammy). Be blotchy or pale. Not go back to normal right after you lightly pinch it and let it go. Little or no tears, pee, or sweat. Changes in vital signs, such as: Fast breathing. Low blood pressure. Weak pulse. Pulse that is more than 100 beats a minute when you are sitting still. Other changes, such as: Feeling very thirsty. Eyes that look hollow (sunken). Cold hands and feet. Being mixed up (confused). Being very tired (lethargic) or having trouble waking from sleep. Short-term weight loss. Loss of consciousness. How is this treated? Treatment for this condition depends on how bad it is. Treatment should start right away. Do not wait until your condition gets very  bad. Very bad dehydration is an emergency. You will need to go to a hospital. Mild or worse dehydration can be treated at home. You may be asked to: Drink more fluids. Drink an oral rehydration solution (ORS). This drink helps get the right amounts of fluids and salts and minerals in the blood (electrolytes). Very bad dehydration can be treated: With fluids through an IV tube. By getting normal levels of salts and minerals in your blood. This is often done by giving salts and minerals through a tube. The tube is passed through your nose and into your stomach. By treating the root cause. Follow these instructions at home: Oral rehydration solution If told by your doctor, drink an ORS: Make an ORS. Use instructions on the package. Start by drinking small amounts, about  cup (120 mL) every 5-10 minutes. Slowly drink more until you have had the amount that your doctor said to have. Eating and drinking     Drink enough clear fluid to keep your pee pale yellow. If you were told to drink an ORS, finish the ORS first. Then, start slowly drinking other clear fluids. Drink fluids such as: Water. Do not drink only water. Doing that can make the salt (sodium) level in your body  get too low. Water from ice chips you suck on. Fruit juice that you have added water to (diluted). Low-calorie sports drinks. Eat foods that have the right amounts of salts and minerals, such as: Bananas. Oranges. Potatoes. Tomatoes. Spinach. Do not drink alcohol. Avoid: Drinks that have a lot of sugar. These include: High-calorie sports drinks. Fruit juice that you did not add water to. Soda. Caffeine. Foods that are greasy or have a lot of fat or sugar. General instructions Take over-the-counter and prescription medicines only as told by your doctor. Do not take salt tablets. Doing that can make the salt level in your body get too high. Return to your normal activities as told by your doctor. Ask your doctor what  activities are safe for you. Keep all follow-up visits as told by your doctor. This is important. Contact a doctor if: You have pain in your belly (abdomen) and the pain: Gets worse. Stays in one place. You have a rash. You have a stiff neck. You get angry or annoyed (irritable) more easily than normal. You are more tired or have a harder time waking than normal. You feel: Weak or dizzy. Very thirsty. Get help right away if you have: Any symptoms of very bad dehydration. Symptoms of vomiting, such as: You cannot eat or drink without vomiting. Your vomiting gets worse or does not go away. Your vomit has blood or green stuff in it. Symptoms that get worse with treatment. A fever. A very bad headache. Problems with peeing or pooping (having a bowel movement), such as: Watery poop that gets worse or does not go away. Blood in your poop (stool). This may cause poop to look black and tarry. Not peeing in 6-8 hours. Peeing only a small amount of very dark pee in 6-8 hours. Trouble breathing. These symptoms may be an emergency. Do not wait to see if the symptoms will go away. Get medical help right away. Call your local emergency services (911 in the [place] Do not drive yourself to the hospital. Summary Dehydration is a condition in which there is not enough water or other fluids in the body. This happens when a person loses more fluids than he or she takes in. Treatment for this condition depends on how bad it is. Treatment should be started right away. Do not wait until your condition gets very bad. Drink enough clear fluid to keep your pee pale yellow. If you were told to drink an oral rehydration solution (ORS), finish the ORS first. Then, start slowly drinking other clear fluids. Take over-the-counter and prescription medicines only as told by your doctor. Get help right away if you have any symptoms of very bad dehydration. This information is not intended to replace advice given to  you by your health care provider. Make sure you discuss any questions you have with your health care provider. Document Revised: 06/23/2019 Document Reviewed: 06/23/2019 Elsevier Patient Education  [place].

## 2021-09-20 NOTE — Progress Notes (Signed)
Hematology/Oncology Progress note [facility] Telephone:([phone] [phone]   Patient Care Team: McLean-Scocuzza, Nino Glow, MD as PCP - General (Internal Medicine) De Hollingshead, Adwolf as Pharmacist (Pharmacist) Theodore Demark, RN as Oncology Nurse Navigator  REFERRING PROVIDER: McLean-Scocuzza, Olivia Mackie *  CHIEF COMPLAINTS/REASON FOR VISIT:  multifocal left breast cancer  HISTORY OF PRESENTING ILLNESS:   Tammy Nunez is a  70 y.o.  female with PMH listed below was seen in consultation at the request of  McLean-Scocuzza, Olivia Mackie *  for evaluation of multifocal left breast cancer.   04/24/2021, screening mammogram showed large asymmetry in the superior aspect of the breast with associated calcifications and possible masses and a subtle distortions.  No suspicious findings for malignancy right breast.  05/02/2021 left breast diagnostic mammogram and ultrasound showed multiple irregular mass in the left breast. Mammogram mass 1 irregular mass associated with distortion in the upper breast at middle depth, multiple adjacent and possible continuous irregular masses, conglomeration of masses measures at least 5.6 cm.  Associated pleomorphic calcifications extending at least 5 x 3.9 x 3.7 cm Mass 2, 1 cm mass in the upper breast at the middle to posterior depth. Mass 3, 5 mm irregular mass in the upper breast at posterior depth. In total, mass 1-mass 3 span at least 8.2 cm in anterior-posterior extent.  By ultrasound  mass 1 12:00 5 cm from nipple, 2.7 x 1.5 x 4.3 cm Mass 2, 12:00 12 cm from nipple, 0.9 x 0.9 x 0.5 cm Mass 3   12:00 14 cm from nipple, 0.4 x 0.4 x 0.4 cm-uses 2.4 cm superior to mass to Mass 4   11:00  No suspicious left axillary adenopathy.   05/07/2021 -Ultrasound-guided breast biopsy Breast left 12:00 mass 5 cm from nipple biopsy showed invasive mammary carcinoma, no special type, grade 3, high-grade DCIS present, no LVI, ER 90%, PR  11-50%, HER2 negative by IHC Breast left 11:00 mass 10 cm from nipple biopsy showed invasive mammary carcinoma, no special type, grade 2, no DCIS/LVI identified.ER 90%, PR 11-50%, HER2 negative by IHC  Patient is married has no children.  Denies any hormone replacement, oral contraceptive use, family history of breast cancer.  Denies any chest radiation. Patient has a history of stroke with chronic residual weakness of the left side.  She walks with a cane.  05/22/2021 MRI breast showed that nonmass like malignancy spanning over 8 cm, abuting anterior aspect of pectoralis muscle. Discussed with Dr.Cintron and we agree that she will need mastectomy. There is concern of possible positive margin. I recommend neoadjuvant chemotherapy  # medi port placed on 06/06/2021. 06/13/2021 pre chemotherapy Echo. LVEF 60-65%.  Normal global longitudinal strain. Grade 1 diastolic dysfunction.   # 05/22/2021 MRI breast showed  Biopsy-proven malignancy within the UPPER LEFT breast spanning a distance of 8.3 x 4.2 x 4 cm nonmass like enhancement abuts the anterior aspect of the LEFT pectoralis muscle without gross invasion. Oncotype Dx 27. Discussed with Dr.Cintron, there is concern of positive margin.   INTERVAL HISTORY Tammy Nunez is a 70 y.o. female who has above history reviewed by me today presents for follow up visit for chemotherapy of breast cancer Patient was accompanied by her husband. Patient left on her bike yesterday.  EMS is was called.  Patient refused to go to ER.  Patient denies any loss of consciousness, headache, trauma, denies any pain.  Yesterday she feels a little weak.  Today she feels weakness has improved.  Denies any  fever chills.  Intermittent diarrhea, very mild.  Symptoms are well controlled with Imodium.   Review of Systems  Constitutional:  Positive for fatigue. Negative for appetite change, chills and fever.  HENT:   Negative for hearing loss and voice change.   Eyes:  Negative  for eye problems.  Respiratory:  Negative for chest tightness and cough.   Cardiovascular:  Negative for chest pain.  Gastrointestinal:  Negative for abdominal distention, abdominal pain, blood in stool, diarrhea and nausea.  Endocrine: Negative for hot flashes.  Genitourinary:  Negative for difficulty urinating and frequency.   Musculoskeletal:  Negative for arthralgias.  Skin:  Negative for itching and rash.  Neurological:  Negative for extremity weakness.  Hematological:  Negative for adenopathy. Does not bruise/bleed easily.  Psychiatric/Behavioral:  Negative for confusion.    MEDICAL HISTORY:  Past Medical History:  Diagnosis Date   Breast cancer in female [facility]) 05/08/2021   Chronic left shoulder pain    Coronary artery disease    Diabetes mellitus without complication ([place])    Dysrhythmia    Hypertension    Hypomagnesemia 09/13/2021   Paralysis ([place])    weakness left upper amd lower extremity   PONV (postoperative nausea and vomiting)    Stroke [facility])     SURGICAL HISTORY: Past Surgical History:  Procedure Laterality Date   BREAST BIOPSY Left 05/07/2021   12:00 5cmfn vision clip path pending   BREAST BIOPSY Left 05/07/2021   11:00 10 cmfn mini cork clip path pending   CAROTID PTA/STENT INTERVENTION Left 07/20/2019   Procedure: CAROTID PTA/STENT INTERVENTION;  Surgeon: Katha Cabal, MD;  Location: [place] [facility];  Service: Cardiovascular;  Laterality: Left;   ECTOPIC PREGNANCY SURGERY     PORTACATH PLACEMENT N/A 06/06/2021   Procedure: INSERTION PORT-A-CATH;  Surgeon: Herbert Pun, MD;  Location: [facility];  Service: General;  Laterality: N/A;    SOCIAL HISTORY: Social History   Socioeconomic History   Marital status: Married    Spouse name: Not on file   Number of children: Not on file   Years of education: Not on file   Highest education level: Not on file  Occupational History   Not on file  Tobacco Use   Smoking status: Former     Packs/day: 0.50    Years: 15.00    Pack years: 7.50    Types: Cigarettes    Quit date: 04/11/2019    Years since quitting: 2.4   Smokeless tobacco: Never  Vaping Use   Vaping Use: Never used  Substance and Sexual Activity   Alcohol use: Never   Drug use: Never   Sexual activity: Not Currently  Other Topics Concern   Not on file  Social History Narrative   No kids    Married with husband    Used to work [employer] broadcast assistant    Social Determinants of Radio broadcast assistant Strain: Low Risk    Difficulty of Paying Living Expenses: Not hard at all  Food Insecurity: Not on file  Transportation Needs: Not on file  Physical Activity: Not on file  Stress: Not on file  Social Connections: Not on file  Intimate Partner Violence: Not on file    FAMILY HISTORY: Family History  Problem Relation Age of Onset   Diabetes type II Mother    Hypertension Father    Heart attack Father    Diabetes type II Brother    Breast cancer Neg Hx     ALLERGIES:  has No Known Allergies.  MEDICATIONS:  Current Outpatient Medications  Medication Sig Dispense Refill   amLODipine (NORVASC) 10 MG tablet Take 1 tablet by mouth once daily 90 tablet 0   aspirin EC 81 MG EC tablet Take 1 tablet (81 mg total) by mouth daily.     atorvastatin (LIPITOR) 80 MG tablet Take 1 tablet (80 mg total) by mouth daily. 90 tablet 3   blood glucose meter kit and supplies 1 each by Other route 4 (four) times daily. Dispense based on patient and insurance preference. Four times daily as directed. (FOR ICD-10 E10.9, E11.9). 1 each 0   carvedilol (COREG) 25 MG tablet Take 1 tablet (25 mg total) by mouth 2 (two) times daily with a meal. 180 tablet 3   clopidogrel (PLAVIX) 75 MG tablet Take 1 tablet (75 mg total) by mouth daily. Further refills [facility] neurology 90 tablet 3   Continuous Blood Gluc Sensor (FREESTYLE LIBRE 2 SENSOR) MISC Use to check glucose at least every 8 hours 6 each 3   empagliflozin  (JARDIANCE) 25 MG TABS tablet Take 1 tablet (25 mg total) by mouth daily. 90 tablet 3   lidocaine-prilocaine (EMLA) cream Apply to affected area once 30 g 3   lisinopril (ZESTRIL) 40 MG tablet Take 1 tablet (40 mg total) by mouth daily. 90 tablet 3   loperamide (IMODIUM) 2 MG capsule Take 1 capsule (2 mg total) by mouth See admin instructions. Take 86m at the onset of diarrhea, and then 267mafter each loose bowel movement, maximum 1664mer 24 hours. 30 capsule 1   loratadine (CLARITIN) 10 MG tablet Take 10 mg by mouth daily.     magnesium chloride (SLOW-MAG) 64 MG TBEC SR tablet Take 1 tablet (64 mg total) by mouth daily. 30 tablet 1   ondansetron (ZOFRAN) 8 MG tablet Take 1 tablet (8 mg total) by mouth 2 (two) times daily as needed. Start on the third day after chemotherapy. 30 tablet 1   potassium chloride SA (KLOR-CON M20) 20 MEQ tablet Take 1 tablet (20 mEq total) by mouth daily. 90 tablet 3   prochlorperazine (COMPAZINE) 10 MG tablet Take 1 tablet (10 mg total) by mouth every 6 (six) hours as needed (Nausea or vomiting). 30 tablet 1   vitamin B-12 (CYANOCOBALAMIN) 1000 MCG tablet Take 1 tablet (1,000 mcg total) by mouth daily. 30 tablet 1   Vitamin D, Cholecalciferol, 50 MCG (2000 UT) CAPS Take 2,000 Units by mouth.     metFORMIN (GLUCOPHAGE XR) 500 MG 24 hr tablet Take 2 tablets (1,000 mg total) by mouth daily with breakfast. (Patient not taking: No sig reported) 180 tablet 3   No current facility-administered medications for this visit.   Facility-Administered Medications Ordered in Other Visits  Medication Dose Route Frequency Provider Last Rate Last Admin   heparin lock flush 100 UNIT/ML injection              PHYSICAL EXAMINATION: ECOG PERFORMANCE STATUS: 1 - Symptomatic but completely ambulatory Vitals:   09/20/21 0834  BP: 107/63  Pulse: 89  Temp: 98.9 F (37.2 C)   Filed Weights   09/20/21 0834  Weight: 124 lb 12.8 oz (56.6 kg)    Physical Exam Constitutional:       General: She is not in acute distress. HENT:     Head: Normocephalic and atraumatic.  Eyes:     General: No scleral icterus. Cardiovascular:     Rate and Rhythm: Normal rate and regular rhythm.  Heart sounds: Normal heart sounds.  Pulmonary:     Effort: Pulmonary effort is normal. No respiratory distress.     Breath sounds: No wheezing.  Abdominal:     General: Bowel sounds are normal. There is no distension.     Palpations: Abdomen is soft.  Musculoskeletal:        General: No deformity. Normal range of motion.     Cervical back: Normal range of motion and neck supple.  Skin:    General: Skin is warm and dry.     Findings: No erythema or rash.  Neurological:     Mental Status: She is alert and oriented to person, place, and time. Mental status is at baseline.     Comments: Chronic left upper and lower extremity weakness.  Psychiatric:        Mood and Affect: Mood normal.      LABORATORY DATA:  I have reviewed the data as listed Lab Results  Component Value Date   WBC 4.6 09/20/2021   HGB 10.3 (L) 09/20/2021   HCT 32.7 (L) 09/20/2021   MCV 97.9 09/20/2021   PLT 250 09/20/2021   Recent Labs    09/06/21 0808 09/13/21 0823 09/20/21 0808  NA 138 138 136  K 3.0* 3.4* 4.7  CL 108 106 108  CO2 20* 21* 20*  GLUCOSE 129* 126* 137*  BUN 21 12 24*  CREATININE 0.87 0.80 1.05*  CALCIUM 8.5* 9.4 9.4  GFRNONAA >60 >60 57*  PROT 6.6 6.3* 6.6  ALBUMIN 4.0 3.6 4.0  AST _0 ALT 34 19 23  ALKPHOS 56 50 51  BILITOT 0.5 0.8 0.8    Iron/TIBC/Ferritin/ %Sat    Component Value Date/Time   IRON 113 12/08/2019 1015   TIBC 317 12/08/2019 1015   FERRITIN 67 12/08/2019 1015   IRONPCTSAT 36 12/08/2019 1015       RADIOGRAPHIC STUDIES: I have personally reviewed the radiological images as listed and agreed with the findings in the report. MR BREAST BILATERAL W [place] CAD  Result Date: 08/26/2021 CLINICAL DATA:  Evaluate treatment response. Patient has  completed 5 weeks of chemotherapy. Ultrasound biopsy of a conglomerate of masses in the LEFT breast 12 o'clock 5 centimeters from nipple showed invasive mammary carcinoma. Biopsy of LEFT breast 11 o'clock location 10 centimeters from the nipple showed invasive mammary carcinoma. Additional masses in the LEFT breast identified with ultrasound were not biopsied. LABS:  None obtained at the time of imaging. EXAM: BILATERAL BREAST MRI WITH AND WITHOUT CONTRAST TECHNIQUE: Multiplanar, multisequence MR images of both breasts were obtained prior to and following the intravenous administration of 6 ml of Gadavist Three-dimensional MR images were rendered by post-processing of the original MR data on an independent workstation. The three-dimensional MR images were interpreted, and findings are reported in the following complete MRI report for this study. Three dimensional images were evaluated at the independent interpreting workstation using the DynaCAD thin client. COMPARISON:  MRI on 05/22/2021 FINDINGS: Breast composition: c. Heterogeneous fibroglandular tissue. Background parenchymal enhancement: Minimal Right breast: No mass or abnormal enhancement. Interval placement of RIGHT-sided Port-A-Cath. Left breast: Numerous masses and adjacent non mass enhancement in the UPPER LEFT breast spans 9.1 x 3.5 x 3.4 centimeters. Previously, mass measured 8.3 x 4.2 x 4.0 centimeters. Mass shows rapid washout type enhancement kinetics. Non mass enhancement is again noted to extend to the LEFT pectoralis muscle, without pectoralis enhancement. Lymph nodes: No abnormal appearing lymph nodes. Ancillary findings:  None.  IMPRESSION: 1. Masses and non mass enhancement in the UPPER LEFT breast, similar in appearance and size compared to prior study. 2. RIGHT breast remains negative. RECOMMENDATION: Treatment plan for LEFT breast malignancy. BI-RADS CATEGORY  6: Known biopsy-proven malignancy. Electronically Signed   By: Nolon Nations  M.D.   On: 08/26/2021 13:49      ASSESSMENT & PLAN:  1. Encounter for antineoplastic chemotherapy   2. Antineoplastic chemotherapy induced anemia   3. Malignant neoplasm of left breast in female, estrogen receptor positive, unspecified site of breast ([place])   4. Status post fall   5. Elevated serum creatinine   Cancer Staging Invasive carcinoma of breast (HCC) Staging form: Breast, AJCC 8th Edition - Clinical stage from 05/16/2021: Stage IIB (cT3, cN0, cM0, G3, ER+, PR+, HER2-) - Signed by [name], MD on 06/03/2021  #Left breast invasive carcinoma, multiple sites.  ER 90%, PR 11-50% positive, HER2 negative by IHC cT3 N0 On neoadjuvant chemotherapy, finished 4 cycles of ddAC Interval MRI showed stable size. Labs are reviewed and discussed with patient Hold off chemotherapy due to recent fall.   #Elevated creatinine, encourage oral hydration.  Proceed with IV fluid normal saline x1. Marland Kitchen #Chemotherapy-induced diarrhea, resolved.  She has Imodium at home and knows how to use it.   #Chemotherapy-induced anemia, stable hemoglobin  .   #Hypokalemia, potassium has increased to 4.7.  Recommend patient to decrease potassium supplementation to 81mq #Hypokalemia, continue slow magnesium 1 tablet daily.  Follow up in 1 week with lab MD Taxol.  All questions were answered. The patient knows to call the clinic with any problems questions or concerns.  cc McLean-Scocuzza, TOlam Idler MD, PhD 09/20/2021

## 2021-09-23 DIAGNOSIS — C50919 Malignant neoplasm of unspecified site of unspecified female breast: Secondary | ICD-10-CM | POA: Diagnosis not present

## 2021-09-23 DIAGNOSIS — I152 Hypertension secondary to endocrine disorders: Secondary | ICD-10-CM

## 2021-09-23 DIAGNOSIS — E1159 Type 2 diabetes mellitus with other circulatory complications: Secondary | ICD-10-CM

## 2021-09-23 DIAGNOSIS — E1165 Type 2 diabetes mellitus with hyperglycemia: Secondary | ICD-10-CM

## 2021-09-27 ENCOUNTER — Inpatient Hospital Stay: Payer: PPO | Attending: Oncology

## 2021-09-27 ENCOUNTER — Other Ambulatory Visit: Payer: Self-pay

## 2021-09-27 ENCOUNTER — Inpatient Hospital Stay: Payer: PPO

## 2021-09-27 ENCOUNTER — Encounter: Payer: Self-pay | Admitting: Oncology

## 2021-09-27 ENCOUNTER — Inpatient Hospital Stay (HOSPITAL_BASED_OUTPATIENT_CLINIC_OR_DEPARTMENT_OTHER): Payer: PPO | Admitting: Oncology

## 2021-09-27 VITALS — BP 93/64 | HR 87 | Temp 98.2°F | Wt 124.3 lb

## 2021-09-27 VITALS — BP 127/70 | HR 86 | Resp 20

## 2021-09-27 DIAGNOSIS — Z79899 Other long term (current) drug therapy: Secondary | ICD-10-CM | POA: Diagnosis not present

## 2021-09-27 DIAGNOSIS — T451X5A Adverse effect of antineoplastic and immunosuppressive drugs, initial encounter: Secondary | ICD-10-CM | POA: Insufficient documentation

## 2021-09-27 DIAGNOSIS — I2510 Atherosclerotic heart disease of native coronary artery without angina pectoris: Secondary | ICD-10-CM | POA: Diagnosis not present

## 2021-09-27 DIAGNOSIS — D6481 Anemia due to antineoplastic chemotherapy: Secondary | ICD-10-CM

## 2021-09-27 DIAGNOSIS — Z87891 Personal history of nicotine dependence: Secondary | ICD-10-CM | POA: Diagnosis not present

## 2021-09-27 DIAGNOSIS — Z8673 Personal history of transient ischemic attack (TIA), and cerebral infarction without residual deficits: Secondary | ICD-10-CM | POA: Diagnosis not present

## 2021-09-27 DIAGNOSIS — C50912 Malignant neoplasm of unspecified site of left female breast: Secondary | ICD-10-CM

## 2021-09-27 DIAGNOSIS — R350 Frequency of micturition: Secondary | ICD-10-CM | POA: Insufficient documentation

## 2021-09-27 DIAGNOSIS — R7989 Other specified abnormal findings of blood chemistry: Secondary | ICD-10-CM | POA: Diagnosis not present

## 2021-09-27 DIAGNOSIS — Z7984 Long term (current) use of oral hypoglycemic drugs: Secondary | ICD-10-CM | POA: Insufficient documentation

## 2021-09-27 DIAGNOSIS — I10 Essential (primary) hypertension: Secondary | ICD-10-CM | POA: Diagnosis not present

## 2021-09-27 DIAGNOSIS — R944 Abnormal results of kidney function studies: Secondary | ICD-10-CM | POA: Insufficient documentation

## 2021-09-27 DIAGNOSIS — E119 Type 2 diabetes mellitus without complications: Secondary | ICD-10-CM | POA: Insufficient documentation

## 2021-09-27 DIAGNOSIS — E876 Hypokalemia: Secondary | ICD-10-CM | POA: Insufficient documentation

## 2021-09-27 LAB — CBC WITH DIFFERENTIAL/PLATELET
Abs Immature Granulocytes: 0.04 10*3/uL (ref 0.00–0.07)
Basophils Absolute: 0 10*3/uL (ref 0.0–0.1)
Basophils Relative: 1 %
Eosinophils Absolute: 0.1 10*3/uL (ref 0.0–0.5)
Eosinophils Relative: 2 %
HCT: 33 % — ABNORMAL LOW (ref 36.0–46.0)
Hemoglobin: 10.2 g/dL — ABNORMAL LOW (ref 12.0–15.0)
Immature Granulocytes: 1 %
Lymphocytes Relative: 26 %
Lymphs Abs: 0.9 10*3/uL (ref 0.7–4.0)
MCH: 30.7 pg (ref 26.0–34.0)
MCHC: 30.9 g/dL (ref 30.0–36.0)
MCV: 99.4 fL (ref 80.0–100.0)
Monocytes Absolute: 0.7 10*3/uL (ref 0.1–1.0)
Monocytes Relative: 19 %
Neutro Abs: 1.8 10*3/uL (ref 1.7–7.7)
Neutrophils Relative %: 51 %
Platelets: 236 10*3/uL (ref 150–400)
RBC: 3.32 MIL/uL — ABNORMAL LOW (ref 3.87–5.11)
RDW: 17 % — ABNORMAL HIGH (ref 11.5–15.5)
WBC: 3.4 10*3/uL — ABNORMAL LOW (ref 4.0–10.5)
nRBC: 0 % (ref 0.0–0.2)

## 2021-09-27 LAB — URINALYSIS, COMPLETE (UACMP) WITH MICROSCOPIC
Bacteria, UA: NONE SEEN
Bilirubin Urine: NEGATIVE
Glucose, UA: >=500 mg/dL — AB
Ketones, ur: 5 mg/dL — AB
Nitrite: NEGATIVE
Protein, ur: 100 mg/dL — AB
RBC / HPF: >50 RBC/hpf — ABNORMAL HIGH (ref 0–5)
Specific Gravity, Urine: 1.01 (ref 1.005–1.030)
WBC, UA: >50 WBC/hpf — ABNORMAL HIGH (ref 0–5)
pH: 5 (ref 5.0–8.0)

## 2021-09-27 LAB — COMPREHENSIVE METABOLIC PANEL
ALT: 13 U/L (ref 0–44)
AST: 15 U/L (ref 15–41)
Albumin: 3.7 g/dL (ref 3.5–5.0)
Alkaline Phosphatase: 48 U/L (ref 38–126)
Anion gap: 8 (ref 5–15)
BUN: 33 mg/dL — ABNORMAL HIGH (ref 8–23)
CO2: 15 mmol/L — ABNORMAL LOW (ref 22–32)
Calcium: 9.1 mg/dL (ref 8.9–10.3)
Chloride: 113 mmol/L — ABNORMAL HIGH (ref 98–111)
Creatinine, Ser: 1.33 mg/dL — ABNORMAL HIGH (ref 0.44–1.00)
GFR, Estimated: 43 mL/min — ABNORMAL LOW (ref >60)
Glucose, Bld: 115 mg/dL — ABNORMAL HIGH (ref 70–99)
Potassium: 5.4 mmol/L — ABNORMAL HIGH (ref 3.5–5.1)
Sodium: 136 mmol/L (ref 135–145)
Total Bilirubin: 0.6 mg/dL (ref 0.3–1.2)
Total Protein: 6.3 g/dL — ABNORMAL LOW (ref 6.5–8.1)

## 2021-09-27 LAB — MAGNESIUM: Magnesium: 1.9 mg/dL (ref 1.7–2.4)

## 2021-09-27 MED ORDER — CIPROFLOXACIN HCL 250 MG PO TABS: 250.0000 mg | ORAL_TABLET | Freq: Two times a day (BID) | ORAL | 0 refills | Status: DC

## 2021-09-27 MED ORDER — HEPARIN SOD (PORK) LOCK FLUSH 100 UNIT/ML IV SOLN
INTRAVENOUS | Status: AC
  Filled 2021-09-27: qty 5

## 2021-09-27 MED ORDER — SODIUM CHLORIDE 0.9 % IV SOLN
Freq: Once | INTRAVENOUS | Status: AC
  Filled 2021-09-27: qty 250

## 2021-09-27 NOTE — Progress Notes (Signed)
Per Janeann Merl RN per Dr. Tasia Catchings, no treatment today.  Pt to get 1 liter of NS IV plus an additional 500 mL NS IV today.  Urine sample collected, patient aware to pick up oral antibiotic at her pharmacy.  Pt left infusion suite stable in a wheelchair.

## 2021-09-27 NOTE — Patient Instructions (Signed)
[facility]  Discharge Instructions: Thank you for choosing [facility] to provide your oncology and hematology care.  If you have a lab appointment with the [facility], please go directly to the [facility] and check in at the registration area.  Wear comfortable clothing and clothing appropriate for easy access to any Portacath or PICC line.   We strive to give you quality time with your provider. You may need to reschedule your appointment if you arrive late (15 or more minutes).  Arriving late affects you and other patients whose appointments are after yours.  Also, if you miss three or more appointments without notifying the office, you may be dismissed from the clinic at the provider's discretion.      For prescription refill requests, have your pharmacy contact our office and allow 72 hours for refills to be completed.    Today you received the following chemotherapy and/or immunotherapy agents IV fluids      To help prevent nausea and vomiting after your treatment, we encourage you to take your nausea medication as directed.  BELOW ARE SYMPTOMS THAT SHOULD BE REPORTED IMMEDIATELY: *FEVER GREATER THAN 100.4 F (38 C) OR HIGHER *CHILLS OR SWEATING *NAUSEA AND VOMITING THAT IS NOT CONTROLLED WITH YOUR NAUSEA MEDICATION *UNUSUAL SHORTNESS OF BREATH *UNUSUAL BRUISING OR BLEEDING *URINARY PROBLEMS (pain or burning when urinating, or frequent urination) *BOWEL PROBLEMS (unusual diarrhea, constipation, pain near the anus) TENDERNESS IN MOUTH AND THROAT WITH OR WITHOUT PRESENCE OF ULCERS (sore throat, sores in mouth, or a toothache) UNUSUAL RASH, SWELLING OR PAIN  UNUSUAL VAGINAL DISCHARGE OR ITCHING   Items with * indicate a potential emergency and should be followed up as soon as possible or go to the Emergency Department if any problems should occur.  Please show the CHEMOTHERAPY ALERT CARD or IMMUNOTHERAPY ALERT CARD at check-in  to the Emergency Department and triage nurse.  Should you have questions after your visit or need to cancel or reschedule your appointment, please contact [facility]  [phone] and follow the prompts.  Office hours are 8:00 a.m. to 4:30 p.m. Monday - Friday. Please note that voicemails left after 4:00 p.m. may not be returned until the following business day.  We are closed weekends and major holidays. You have access to a nurse at all times for urgent questions. Please call the main number to the clinic [phone] and follow the prompts.  For any non-urgent questions, you may also contact your provider using MyChart. We now offer e-Visits for anyone 18 and older to request care online for non-urgent symptoms. For details visit [email or web address].   Also download the MyChart app! Go to the app store, search "MyChart", open the app, select [facility], and log in with your MyChart username and password.  Due to Covid, a mask is required upon entering the hospital/clinic. If you do not have a mask, one will be given to you upon arrival. For doctor visits, patients may have 1 support person aged 20 or older with them. For treatment visits, patients cannot have anyone with them due to current Covid guidelines and our immunocompromised population.   Dehydration, Adult Dehydration is a condition in which there is not enough water or other fluids in the body. This happens when a person loses more fluids than he or she takes in. Important organs, such as the kidneys, brain, and heart, cannot function without a proper amount of fluids. Any loss of fluids  from the body can lead to dehydration. Dehydration can be mild, moderate, or severe. It should be treated right away to prevent it from becoming severe. What are the causes? Dehydration may be caused by: Conditions that cause loss of water or other fluids, such as diarrhea, vomiting, or sweating or urinating a  lot. Not drinking enough fluids, especially when you are ill or doing activities that require a lot of energy. Other illnesses and conditions, such as fever or infection. Certain medicines, such as medicines that remove excess fluid from the body (diuretics). Lack of safe drinking water. Not being able to get enough water and food. What increases the risk? The following factors may make you more likely to develop this condition: Having a long-term (chronic) illness that has not been treated properly, such as diabetes, heart disease, or kidney disease. Being 69 years of age or older. Having a disability. Living in a place that is high in altitude, where thinner, drier air causes more fluid loss. Doing exercises that put stress on your body for a long time (endurance sports). What are the signs or symptoms? Symptoms of dehydration depend on how severe it is. Mild or moderate dehydration Thirst. Dry lips or dry mouth. Dizziness or light-headedness, especially when standing up from a seated position. Muscle cramps. Dark urine. Urine may be the color of tea. Less urine or tears produced than usual. Headache. Severe dehydration Changes in skin. Your skin may be cold and clammy, blotchy, or pale. Your skin also may not return to normal after being lightly pinched and released. Little or no tears, urine, or sweat. Changes in vital signs, such as rapid breathing and low blood pressure. Your pulse may be weak or may be faster than 100 beats a minute when you are sitting still. Other changes, such as: Feeling very thirsty. Sunken eyes. Cold hands and feet. Confusion. Being very tired (lethargic) or having trouble waking from sleep. Short-term weight loss. Loss of consciousness. How is this diagnosed? This condition is diagnosed based on your symptoms and a physical exam. You may have blood and urine tests to help confirm the diagnosis. How is this treated? Treatment for this condition  depends on how severe it is. Treatment should be started right away. Do not wait until dehydration becomes severe. Severe dehydration is an emergency and needs to be treated in a hospital. Mild or moderate dehydration can be treated at home. You may be asked to: Drink more fluids. Drink an oral rehydration solution (ORS). This drink helps restore proper amounts of fluids and salts and minerals in the blood (electrolytes). Severe dehydration can be treated: With IV fluids. By correcting abnormal levels of electrolytes. This is often done by giving electrolytes through a tube that is passed through your nose and into your stomach (nasogastric tube, or NG tube). By treating the underlying cause of dehydration. Follow these instructions at home: Oral rehydration solution If told by your health care provider, drink an ORS: Make an ORS by following instructions on the package. Start by drinking small amounts, about  cup (120 mL) every 5-10 minutes. Slowly increase how much you drink until you have taken the amount recommended by your health care provider. Eating and drinking     Drink enough clear fluid to keep your urine pale yellow. If you were told to drink an ORS, finish the ORS first and then start slowly drinking other clear fluids. Drink fluids such as: Water. Do not drink only water. Doing that can lead  to hyponatremia, which is having too little salt (sodium) in the body. Water from ice chips you suck on. Fruit juice that you have added water to (diluted fruit juice). Low-calorie sports drinks. Eat foods that contain a healthy balance of electrolytes, such as bananas, oranges, potatoes, tomatoes, and spinach. Do not drink alcohol. Avoid the following: Drinks that contain a lot of sugar. These include high-calorie sports drinks, fruit juice that is not diluted, and soda. Caffeine. Foods that are greasy or contain a lot of fat or sugar. General instructions Take over-the-counter and  prescription medicines only as told by your health care provider. Do not take sodium tablets. Doing that can lead to having too much sodium in the body (hypernatremia). Return to your normal activities as told by your health care provider. Ask your health care provider what activities are safe for you. Keep all follow-up visits as told by your health care provider. This is important. Contact a health care provider if: You have muscle cramps, pain, or discomfort, such as: Pain in your abdomen and the pain gets worse or stays in one area (localizes). Stiff neck. You have a rash. You are more irritable than usual. You are sleepier or have a harder time waking than usual. You feel weak or dizzy. You feel very thirsty. Get help right away if you have: Any symptoms of severe dehydration. Symptoms of vomiting, such as: You cannot eat or drink without vomiting. Vomiting gets worse or does not go away. Vomit includes blood or green matter (bile). Symptoms that get worse with treatment. A fever. A severe headache. Problems with urination or bowel movements, such as: Diarrhea that gets worse or does not go away. Blood in your stool (feces). This may cause stool to look black and tarry. Not urinating, or urinating only a small amount of very dark urine, within 6-8 hours. Trouble breathing. These symptoms may represent a serious problem that is an emergency. Do not wait to see if the symptoms will go away. Get medical help right away. Call your local emergency services (911 in the [place] Do not drive yourself to the hospital. Summary Dehydration is a condition in which there is not enough water or other fluids in the body. This happens when a person loses more fluids than he or she takes in. Treatment for this condition depends on how severe it is. Treatment should be started right away. Do not wait until dehydration becomes severe. Drink enough clear fluid to keep your urine pale yellow. If you  were told to drink an oral rehydration solution (ORS), finish the ORS first and then start slowly drinking other clear fluids. Take over-the-counter and prescription medicines only as told by your health care provider. Get help right away if you have any symptoms of severe dehydration. This information is not intended to replace advice given to you by your health care provider. Make sure you discuss any questions you have with your health care provider. Document Revised: 06/23/2019 Document Reviewed: 06/23/2019 [employer] Patient Education  [employer].

## 2021-09-27 NOTE — Progress Notes (Signed)
**Note Tammy-Identified via Obfuscation** Hematology/Oncology Progress note [facility] [phone] [phone]   Patient Care Team: Tammy Nunez, Tammy Glow, MD as PCP - General (Internal Medicine) Tammy Nunez, Tammy Nunez as Pharmacist (Pharmacist) Tammy Demark, RN as Oncology Nurse Navigator  REFERRING PROVIDER: McLean-Scocuzza, Tammy Nunez *  CHIEF COMPLAINTS/REASON FOR VISIT:  multifocal left breast cancer  HISTORY OF PRESENTING ILLNESS:   Tammy Nunez is a  70 y.o.  female with PMH listed below was seen in consultation at the request of  Tammy Nunez, Tammy Nunez *  for evaluation of multifocal left breast cancer.   04/24/2021, screening mammogram showed large asymmetry in the superior aspect of the breast with associated calcifications and possible masses and a subtle distortions.  No suspicious findings for malignancy right breast.  05/02/2021 left breast diagnostic mammogram and ultrasound showed multiple irregular mass in the left breast. Mammogram mass 1 irregular mass associated with distortion in the upper breast at middle depth, multiple adjacent and possible continuous irregular masses, conglomeration of masses measures at least 5.6 cm.  Associated pleomorphic calcifications extending at least 5 x 3.9 x 3.7 cm Mass 2, 1 cm mass in the upper breast at the middle to posterior depth. Mass 3, 5 mm irregular mass in the upper breast at posterior depth. In total, mass 1-mass 3 span at least 8.2 cm in anterior-posterior extent.  By ultrasound  mass 1 12:00 5 cm from nipple, 2.7 x 1.5 x 4.3 cm Mass 2, 12:00 12 cm from nipple, 0.9 x 0.9 x 0.5 cm Mass 3   12:00 14 cm from nipple, 0.4 x 0.4 x 0.4 cm-uses 2.4 cm superior to mass to Mass 4   11:00  No suspicious left axillary adenopathy.   05/07/2021 -Ultrasound-guided breast biopsy Breast left 12:00 mass 5 cm from nipple biopsy showed invasive mammary carcinoma, no special type, grade 3, high-grade DCIS present, no LVI, ER 90%, PR  11-50%, HER2 negative by IHC Breast left 11:00 mass 10 cm from nipple biopsy showed invasive mammary carcinoma, no special type, grade 2, no DCIS/LVI identified.ER 90%, PR 11-50%, HER2 negative by IHC  Patient is married has no children.  Denies any hormone replacement, oral contraceptive use, family history of breast cancer.  Denies any chest radiation. Patient has a history of stroke with chronic residual weakness of the left side.  She walks with a cane.  05/22/2021 MRI breast showed that nonmass like malignancy spanning over 8 cm, abuting anterior aspect of pectoralis muscle. Discussed with Dr.Cintron and we agree that she will need mastectomy. There is concern of possible positive margin. I recommend neoadjuvant chemotherapy  # medi port placed on 06/06/2021. 06/13/2021 pre chemotherapy Echo. LVEF 60-65%.  Normal global longitudinal strain. Grade 1 diastolic dysfunction.   # 05/22/2021 MRI breast showed  Biopsy-proven malignancy within the UPPER LEFT breast spanning a distance of 8.3 x 4.2 x 4 cm nonmass like enhancement abuts the anterior aspect of the LEFT pectoralis muscle without gross invasion. Oncotype Dx 27. Discussed with Dr.Cintron, there is concern of positive margin.   INTERVAL HISTORY Tammy Nunez is a 70 y.o. female who has above history reviewed by me today presents for follow up visit for chemotherapy of breast cancer Patient was accompanied by her husband. Poor oral intake. Patient reports frequent urination.  She has felt some burning sensations a few days ago. No nausea vomiting diarrhea.  Denies any loose bowel movements.    Review of Systems  Constitutional:  Positive for fatigue. Negative for appetite change, chills and fever.  HENT:   Negative for hearing loss and voice change.   Eyes:  Negative for eye problems.  Respiratory:  Negative for chest tightness and cough.   Cardiovascular:  Negative for chest pain.  Gastrointestinal:  Negative for abdominal  distention, abdominal pain, blood in stool, diarrhea and nausea.  Endocrine: Negative for hot flashes.  Genitourinary:  Positive for dysuria and frequency. Negative for difficulty urinating.   Musculoskeletal:  Negative for arthralgias.  Skin:  Negative for itching and rash.  Neurological:  Negative for extremity weakness.  Hematological:  Negative for adenopathy. Does not bruise/bleed easily.  Psychiatric/Behavioral:  Negative for confusion.    MEDICAL HISTORY:  Past Medical History:  Diagnosis Date   Breast cancer in female [facility]) 05/08/2021   Chronic left shoulder pain    Coronary artery disease    Diabetes mellitus without complication ([place])    Dysrhythmia    Hypertension    Hypomagnesemia 09/13/2021   Paralysis ([place])    weakness left upper amd lower extremity   PONV (postoperative nausea and vomiting)    Stroke [facility])     SURGICAL HISTORY: Past Surgical History:  Procedure Laterality Date   BREAST BIOPSY Left 05/07/2021   12:00 5cmfn vision clip path pending   BREAST BIOPSY Left 05/07/2021   11:00 10 cmfn mini cork clip path pending   CAROTID PTA/STENT INTERVENTION Left 07/20/2019   Procedure: CAROTID PTA/STENT INTERVENTION;  Surgeon: Katha Cabal, MD;  Location: [place] [facility];  Service: Cardiovascular;  Laterality: Left;   ECTOPIC PREGNANCY SURGERY     PORTACATH PLACEMENT N/A 06/06/2021   Procedure: INSERTION PORT-A-CATH;  Surgeon: Herbert Pun, MD;  Location: [facility] ORS;  Service: General;  Laterality: N/A;    SOCIAL HISTORY: Social History   Socioeconomic History   Marital status: Married    Spouse name: Not on file   Number of children: Not on file   Years of education: Not on file   Highest education level: Not on file  Occupational History   Not on file  Tobacco Use   Smoking status: Former    Packs/day: 0.50    Years: 15.00    Pack years: 7.50    Types: Cigarettes    Quit date: 04/11/2019    Years since quitting: 2.4   Smokeless  tobacco: Never  Vaping Use   Vaping Use: Never used  Substance and Sexual Activity   Alcohol use: Never   Drug use: Never   Sexual activity: Not Currently  Other Topics Concern   Not on file  Social History Narrative   No kids    Married with husband    Used to work [employer] broadcast assistant    Social Determinants of Radio broadcast assistant Strain: Low Risk    Difficulty of Paying Living Expenses: Not hard at all  Food Insecurity: Not on file  Transportation Needs: Not on file  Physical Activity: Not on file  Stress: Not on file  Social Connections: Not on file  Intimate Partner Violence: Not on file    FAMILY HISTORY: Family History  Problem Relation Age of Onset   Diabetes type II Mother    Hypertension Father    Heart attack Father    Diabetes type II Brother    Breast cancer Neg Hx     ALLERGIES:  has No Known Allergies.  MEDICATIONS:  Current Outpatient Medications  Medication Sig Dispense Refill   amLODipine (NORVASC) 10 MG tablet Take 1 tablet by mouth once daily  90 tablet 0   aspirin EC 81 MG EC tablet Take 1 tablet (81 mg total) by mouth daily.     atorvastatin (LIPITOR) 80 MG tablet Take 1 tablet (80 mg total) by mouth daily. 90 tablet 3   blood glucose meter kit and supplies 1 each by Other route 4 (four) times daily. Dispense based on patient and insurance preference. Four times daily as directed. (FOR ICD-10 E10.9, E11.9). 1 each 0   carvedilol (COREG) 25 MG tablet Take 1 tablet (25 mg total) by mouth 2 (two) times daily with a meal. 180 tablet 3   ciprofloxacin (CIPRO) 250 MG tablet Take 1 tablet (250 mg total) by mouth 2 (two) times daily. 10 tablet 0   clopidogrel (PLAVIX) 75 MG tablet Take 1 tablet (75 mg total) by mouth daily. Further refills [facility] 90 tablet 3   Continuous Blood Gluc Sensor (FREESTYLE LIBRE 2 SENSOR) MISC Use to check glucose at least every 8 hours 6 each 3   empagliflozin (JARDIANCE) 25 MG TABS tablet Take 1  tablet (25 mg total) by mouth daily. 90 tablet 3   lidocaine-prilocaine (EMLA) cream Apply to affected area once 30 g 3   lisinopril (ZESTRIL) 40 MG tablet Take 1 tablet (40 mg total) by mouth daily. 90 tablet 3   loperamide (IMODIUM) 2 MG capsule Take 1 capsule (2 mg total) by mouth See admin instructions. Take 60m at the onset of diarrhea, and then 2100mafter each loose bowel movement, maximum 1657mer 24 hours. 30 capsule 1   loratadine (CLARITIN) 10 MG tablet Take 10 mg by mouth daily.     magnesium chloride (SLOW-MAG) 64 MG TBEC SR tablet Take 1 tablet (64 mg total) by mouth daily. 30 tablet 1   ondansetron (ZOFRAN) 8 MG tablet Take 1 tablet (8 mg total) by mouth 2 (two) times daily as needed. Start on the third day after chemotherapy. 30 tablet 1   potassium chloride SA (KLOR-CON M20) 20 MEQ tablet Take 1 tablet (20 mEq total) by mouth daily. 90 tablet 3   prochlorperazine (COMPAZINE) 10 MG tablet Take 1 tablet (10 mg total) by mouth every 6 (six) hours as needed (Nausea or vomiting). 30 tablet 1   vitamin B-12 (CYANOCOBALAMIN) 1000 MCG tablet Take 1 tablet (1,000 mcg total) by mouth daily. 30 tablet 1   Vitamin D, Cholecalciferol, 50 MCG (2000 UT) CAPS Take 2,000 Units by mouth.     metFORMIN (GLUCOPHAGE XR) 500 MG 24 hr tablet Take 2 tablets (1,000 mg total) by mouth daily with breakfast. (Patient not taking: No sig reported) 180 tablet 3   No current facility-administered medications for this visit.   Facility-Administered Medications Ordered in Other Visits  Medication Dose Route Frequency Provider Last Rate Last Admin   heparin lock flush 100 UNIT/ML injection              PHYSICAL EXAMINATION: ECOG PERFORMANCE STATUS: 1 - Symptomatic but completely ambulatory Vitals:   09/27/21 1013  BP: 93/64  Pulse: 87  Temp: 98.2 F (36.8 C)   Filed Weights   09/27/21 1013  Weight: 124 lb 4.8 oz (56.4 kg)    Physical Exam Constitutional:      General: She is not in acute  distress. HENT:     Head: Normocephalic and atraumatic.  Eyes:     General: No scleral icterus. Cardiovascular:     Rate and Rhythm: Normal rate and regular rhythm.     Heart sounds: Normal heart sounds.  Pulmonary:     Effort: Pulmonary effort is normal. No respiratory distress.     Breath sounds: No wheezing.  Abdominal:     General: Bowel sounds are normal. There is no distension.     Palpations: Abdomen is soft.  Musculoskeletal:        General: No deformity. Normal range of motion.     Cervical back: Normal range of motion and neck supple.  Skin:    General: Skin is warm and dry.     Findings: No erythema or rash.  Neurological:     Mental Status: She is alert and oriented to person, place, and time. Mental status is at baseline.     Comments: Chronic left upper and lower extremity weakness.  Psychiatric:        Mood and Affect: Mood normal.      LABORATORY DATA:  I have reviewed the data as listed Lab Results  Component Value Date   WBC 3.4 (L) 09/27/2021   HGB 10.2 (L) 09/27/2021   HCT 33.0 (L) 09/27/2021   MCV 99.4 09/27/2021   PLT 236 09/27/2021   Recent Labs    09/13/21 0823 09/20/21 0808 09/27/21 0957  NA 138 136 136  K 3.4* 4.7 5.4*  CL 106 108 113*  CO2 21* 20* 15*  GLUCOSE 126* 137* 115*  BUN 12 24* 33*  CREATININE 0.80 1.05* 1.33*  CALCIUM 9.4 9.4 9.1  GFRNONAA >60 57* 43*  PROT 6.3* 6.6 6.3*  ALBUMIN 3.6 4.0 3.7  AST _0 ALT _1 ALKPHOS 50 51 48  BILITOT 0.8 0.8 0.6    Iron/TIBC/Ferritin/ %Sat    Component Value Date/Time   IRON 113 12/08/2019 1015   TIBC 317 12/08/2019 1015   FERRITIN 67 12/08/2019 1015   IRONPCTSAT 36 12/08/2019 1015       RADIOGRAPHIC STUDIES: I have personally reviewed the radiological images as listed and agreed with the findings in the report. MR BREAST BILATERAL W [place] CAD  Result Date: 08/26/2021 CLINICAL DATA:  Evaluate treatment response. Patient has completed 5 weeks of  chemotherapy. Ultrasound biopsy of a conglomerate of masses in the LEFT breast 12 o'clock 5 centimeters from nipple showed invasive mammary carcinoma. Biopsy of LEFT breast 11 o'clock location 10 centimeters from the nipple showed invasive mammary carcinoma. Additional masses in the LEFT breast identified with ultrasound were not biopsied. LABS:  None obtained at the time of imaging. EXAM: BILATERAL BREAST MRI WITH AND WITHOUT CONTRAST TECHNIQUE: Multiplanar, multisequence MR images of both breasts were obtained prior to and following the intravenous administration of 6 ml of Gadavist Three-dimensional MR images were rendered by post-processing of the original MR data on an independent workstation. The three-dimensional MR images were interpreted, and findings are reported in the following complete MRI report for this study. Three dimensional images were evaluated at the independent interpreting workstation using the DynaCAD thin client. COMPARISON:  MRI on 05/22/2021 FINDINGS: Breast composition: c. Heterogeneous fibroglandular tissue. Background parenchymal enhancement: Minimal Right breast: No mass or abnormal enhancement. Interval placement of RIGHT-sided Port-A-Cath. Left breast: Numerous masses and adjacent non mass enhancement in the UPPER LEFT breast spans 9.1 x 3.5 x 3.4 centimeters. Previously, mass measured 8.3 x 4.2 x 4.0 centimeters. Mass shows rapid washout type enhancement kinetics. Non mass enhancement is again noted to extend to the LEFT pectoralis muscle, without pectoralis enhancement. Lymph nodes: No abnormal appearing lymph nodes. Ancillary findings:  None. IMPRESSION: 1. Masses and non  mass enhancement in the UPPER LEFT breast, similar in appearance and size compared to prior study. 2. RIGHT breast remains negative. RECOMMENDATION: Treatment plan for LEFT breast malignancy. BI-RADS CATEGORY  6: Known biopsy-proven malignancy. Electronically Signed   By: Nolon Nations M.D.   On: 08/26/2021  13:49      ASSESSMENT & PLAN:  1. Malignant neoplasm of left female breast, unspecified estrogen receptor status, unspecified site of breast ([place])   2. Urinary frequency   3. Elevated serum creatinine   4. Hypomagnesemia   5. Antineoplastic chemotherapy induced anemia   Cancer Staging Invasive carcinoma of breast ([place]) Staging form: Breast, AJCC 8th Edition - Clinical stage from 05/16/2021: Stage IIB (cT3, cN0, cM0, G3, ER+, PR+, HER2-) - Signed by [name], MD on 06/03/2021  #Left breast invasive carcinoma, multiple sites.  ER 90%, PR 11-50% positive, HER2 negative by IHC cT3 N0 On neoadjuvant chemotherapy, finished 4 cycles of ddAC Interval MRI showed stable size. Labs reviewed and discussed with patient Hold chemotherapy due to UTI  #Dysuria/increased urinary frequency. UA showed leukocytes positive, cultures pending. Started patient on empiric Cipro 250 mg twice daily for 5 days.  Follow-up culture  #Hypotension, possibly secondary to decreased oral intake.  +/- Infection. IV normal saline 1 L x 1.  Hold BP meds.  #Elevated creatinine/ AKI , encourage oral hydration.  Proceed with IV fluid normal saline x1.  Repeat BMP in 3 days. Marland Kitchen #Chemotherapy-induced diarrhea, resolved.   #Chemotherapy-induced anemia, stable hemoglobin  .   #Hypokalemia, potassium has increased to 5.4.  Recommend patient to stop potassium treatment. #Hypomagnesia continue slow magnesium 1 tablet daily.  Follow up in 3 days, repeat BMP +/- IV fluid. 1 week lab MD  All questions were answered. The patient knows to call the clinic with any problems questions or concerns.  cc Tammy Nunez, Olam Idler, MD, PhD 09/27/2021

## 2021-09-28 LAB — URINE CULTURE: Culture: NO GROWTH

## 2021-09-30 ENCOUNTER — Other Ambulatory Visit: Payer: Self-pay

## 2021-09-30 ENCOUNTER — Inpatient Hospital Stay: Payer: PPO

## 2021-09-30 VITALS — BP 106/58 | HR 78 | Temp 97.7°F | Resp 18

## 2021-09-30 DIAGNOSIS — C50912 Malignant neoplasm of unspecified site of left female breast: Secondary | ICD-10-CM

## 2021-09-30 DIAGNOSIS — E860 Dehydration: Secondary | ICD-10-CM

## 2021-09-30 LAB — BASIC METABOLIC PANEL
Anion gap: 8 (ref 5–15)
BUN: 14 mg/dL (ref 8–23)
CO2: 15 mmol/L — ABNORMAL LOW (ref 22–32)
Calcium: 8.6 mg/dL — ABNORMAL LOW (ref 8.9–10.3)
Chloride: 114 mmol/L — ABNORMAL HIGH (ref 98–111)
Creatinine, Ser: 1.08 mg/dL — ABNORMAL HIGH (ref 0.44–1.00)
GFR, Estimated: 55 mL/min — ABNORMAL LOW (ref >60)
Glucose, Bld: 115 mg/dL — ABNORMAL HIGH (ref 70–99)
Potassium: 4.2 mmol/L (ref 3.5–5.1)
Sodium: 137 mmol/L (ref 135–145)

## 2021-09-30 MED ORDER — SODIUM CHLORIDE 0.9 % IV SOLN
Freq: Once | INTRAVENOUS | Status: AC
  Filled 2021-09-30: qty 250

## 2021-09-30 MED ORDER — SODIUM CHLORIDE 0.9% FLUSH
10.0000 mL | INTRAVENOUS | Status: DC | PRN
  Administered 2021-09-30: 10 mL via INTRAVENOUS
  Filled 2021-09-30: qty 10

## 2021-09-30 MED ORDER — HEPARIN SOD (PORK) LOCK FLUSH 100 UNIT/ML IV SOLN
500.0000 [IU] | Freq: Once | INTRAVENOUS | Status: AC
  Administered 2021-09-30: 500 [IU] via INTRAVENOUS
  Filled 2021-09-30: qty 5

## 2021-09-30 NOTE — Patient Instructions (Signed)

## 2021-10-03 ENCOUNTER — Inpatient Hospital Stay (HOSPITAL_BASED_OUTPATIENT_CLINIC_OR_DEPARTMENT_OTHER): Payer: PPO | Admitting: Oncology

## 2021-10-03 ENCOUNTER — Encounter: Payer: Self-pay | Admitting: Oncology

## 2021-10-03 ENCOUNTER — Other Ambulatory Visit: Payer: Self-pay

## 2021-10-03 ENCOUNTER — Inpatient Hospital Stay: Payer: PPO

## 2021-10-03 ENCOUNTER — Other Ambulatory Visit: Payer: Self-pay | Admitting: *Deleted

## 2021-10-03 DIAGNOSIS — C50912 Malignant neoplasm of unspecified site of left female breast: Secondary | ICD-10-CM

## 2021-10-03 DIAGNOSIS — E876 Hypokalemia: Secondary | ICD-10-CM | POA: Diagnosis not present

## 2021-10-03 DIAGNOSIS — D6481 Anemia due to antineoplastic chemotherapy: Secondary | ICD-10-CM | POA: Diagnosis not present

## 2021-10-03 DIAGNOSIS — T451X5A Adverse effect of antineoplastic and immunosuppressive drugs, initial encounter: Secondary | ICD-10-CM

## 2021-10-03 DIAGNOSIS — Z170 Estrogen receptor positive status [ER+]: Secondary | ICD-10-CM

## 2021-10-03 DIAGNOSIS — E860 Dehydration: Secondary | ICD-10-CM

## 2021-10-03 LAB — CBC WITH DIFFERENTIAL/PLATELET
Abs Immature Granulocytes: 0.06 10*3/uL (ref 0.00–0.07)
Basophils Absolute: 0 10*3/uL (ref 0.0–0.1)
Basophils Relative: 1 %
Eosinophils Absolute: 0.1 10*3/uL (ref 0.0–0.5)
Eosinophils Relative: 1 %
HCT: 30.5 % — ABNORMAL LOW (ref 36.0–46.0)
Hemoglobin: 9.6 g/dL — ABNORMAL LOW (ref 12.0–15.0)
Immature Granulocytes: 1 %
Lymphocytes Relative: 23 %
Lymphs Abs: 1.2 10*3/uL (ref 0.7–4.0)
MCH: 31 pg (ref 26.0–34.0)
MCHC: 31.5 g/dL (ref 30.0–36.0)
MCV: 98.4 fL (ref 80.0–100.0)
Monocytes Absolute: 0.8 10*3/uL (ref 0.1–1.0)
Monocytes Relative: 16 %
Neutro Abs: 3 10*3/uL (ref 1.7–7.7)
Neutrophils Relative %: 58 %
Platelets: 153 10*3/uL (ref 150–400)
RBC: 3.1 MIL/uL — ABNORMAL LOW (ref 3.87–5.11)
RDW: 17.2 % — ABNORMAL HIGH (ref 11.5–15.5)
WBC: 5.2 10*3/uL (ref 4.0–10.5)
nRBC: 0.4 % — ABNORMAL HIGH (ref 0.0–0.2)

## 2021-10-03 LAB — COMPREHENSIVE METABOLIC PANEL
ALT: 15 U/L (ref 0–44)
AST: 16 U/L (ref 15–41)
Albumin: 3.4 g/dL — ABNORMAL LOW (ref 3.5–5.0)
Alkaline Phosphatase: 47 U/L (ref 38–126)
Anion gap: 8 (ref 5–15)
BUN: 15 mg/dL (ref 8–23)
CO2: 16 mmol/L — ABNORMAL LOW (ref 22–32)
Calcium: 8.8 mg/dL — ABNORMAL LOW (ref 8.9–10.3)
Chloride: 115 mmol/L — ABNORMAL HIGH (ref 98–111)
Creatinine, Ser: 0.93 mg/dL (ref 0.44–1.00)
GFR, Estimated: >60 mL/min (ref >60)
Glucose, Bld: 107 mg/dL — ABNORMAL HIGH (ref 70–99)
Potassium: 3.6 mmol/L (ref 3.5–5.1)
Sodium: 139 mmol/L (ref 135–145)
Total Bilirubin: 0.5 mg/dL (ref 0.3–1.2)
Total Protein: 5.9 g/dL — ABNORMAL LOW (ref 6.5–8.1)

## 2021-10-03 LAB — MAGNESIUM: Magnesium: 1.6 mg/dL — ABNORMAL LOW (ref 1.7–2.4)

## 2021-10-03 MED ORDER — SODIUM CHLORIDE 0.9% FLUSH
10.0000 mL | Freq: Once | INTRAVENOUS | Status: AC
  Administered 2021-10-03: 10 mL via INTRAVENOUS
  Filled 2021-10-03: qty 10

## 2021-10-03 MED ORDER — SODIUM CHLORIDE 0.9 % IV SOLN
Freq: Once | INTRAVENOUS | Status: AC
  Filled 2021-10-03: qty 250

## 2021-10-03 MED ORDER — HEPARIN SOD (PORK) LOCK FLUSH 100 UNIT/ML IV SOLN
500.0000 [IU] | Freq: Once | INTRAVENOUS | Status: AC
  Administered 2021-10-03: 500 [IU] via INTRAVENOUS
  Filled 2021-10-03: qty 5

## 2021-10-03 NOTE — Progress Notes (Signed)
Appetite is getting better. Energy remains low. Denies pain. Occ diarrhea ~ once a week maybe.

## 2021-10-03 NOTE — Progress Notes (Signed)
Hematology/Oncology Progress note [phone] [phone]   Patient Care Team: McLean-Scocuzza, Nino Glow, MD as PCP - General (Internal Medicine) De Hollingshead, [place] as Pharmacist (Pharmacist) Theodore Demark, RN as Oncology Nurse Navigator  REFERRING PROVIDER: McLean-Scocuzza, Olivia Mackie *  CHIEF COMPLAINTS/REASON FOR VISIT:  multifocal left breast cancer  HISTORY OF PRESENTING ILLNESS:   Tammy Nunez is a  70 y.o.  female with PMH listed below was seen in consultation at the request of  McLean-Scocuzza, Olivia Mackie *  for evaluation of multifocal left breast cancer.   04/24/2021, screening mammogram showed large asymmetry in the superior aspect of the breast with associated calcifications and possible masses and a subtle distortions.  No suspicious findings for malignancy right breast.  05/02/2021 left breast diagnostic mammogram and ultrasound showed multiple irregular mass in the left breast. Mammogram mass 1 irregular mass associated with distortion in the upper breast at middle depth, multiple adjacent and possible continuous irregular masses, conglomeration of masses measures at least 5.6 cm.  Associated pleomorphic calcifications extending at least 5 x 3.9 x 3.7 cm Mass 2, 1 cm mass in the upper breast at the middle to posterior depth. Mass 3, 5 mm irregular mass in the upper breast at posterior depth. In total, mass 1-mass 3 span at least 8.2 cm in anterior-posterior extent.  By ultrasound  mass 1 12:00 5 cm from nipple, 2.7 x 1.5 x 4.3 cm Mass 2, 12:00 12 cm from nipple, 0.9 x 0.9 x 0.5 cm Mass 3   12:00 14 cm from nipple, 0.4 x 0.4 x 0.4 cm-uses 2.4 cm superior to mass to Mass 4   11:00  No suspicious left axillary adenopathy.   05/07/2021 -Ultrasound-guided breast biopsy Breast left 12:00 mass 5 cm from nipple biopsy showed invasive mammary carcinoma, no special type, grade 3, high-grade DCIS present, no LVI, ER 90%, PR 11-50%, HER2 negative by  IHC Breast left 11:00 mass 10 cm from nipple biopsy showed invasive mammary carcinoma, no special type, grade 2, no DCIS/LVI identified.ER 90%, PR 11-50%, HER2 negative by IHC  Patient is married has no children.  Denies any hormone replacement, oral contraceptive use, family history of breast cancer.  Denies any chest radiation. Patient has a history of stroke with chronic residual weakness of the left side.  She walks with a cane.  05/22/2021 MRI breast showed that nonmass like malignancy spanning over 8 cm, abuting anterior aspect of pectoralis muscle. Discussed with Dr.Cintron and we agree that she will need mastectomy. There is concern of possible positive margin. I recommend neoadjuvant chemotherapy  # medi port placed on 06/06/2021. 06/13/2021 pre chemotherapy Echo. LVEF 60-65%.  Normal global longitudinal strain. Grade 1 diastolic dysfunction.   # 05/22/2021 MRI breast showed  Biopsy-proven malignancy within the UPPER LEFT breast spanning a distance of 8.3 x 4.2 x 4 cm nonmass like enhancement abuts the anterior aspect of the LEFT pectoralis muscle without gross invasion. Oncotype Dx 27. Discussed with Dr.Cintron, there is concern of positive margin.   INTERVAL HISTORY Tammy Nunez is a 70 y.o. female who has above history reviewed by me today presents for follow up visit for chemotherapy of breast cancer Patient was accompanied by her husband. Patient finished course of antibiotics with Cipro for UTI.  Urine culture came back no growth. Today she feels much better.  Appetite has improved.  No nausea vomiting diarrhea.  She has gained 3 pounds since last visit.  No dysuria or urgency/frequency.   Review of Systems  Constitutional:  Positive for fatigue. Negative for appetite change, chills and fever.  HENT:   Negative for hearing loss and voice change.   Eyes:  Negative for eye problems.  Respiratory:  Negative for chest tightness and cough.   Cardiovascular:  Negative for chest  pain.  Gastrointestinal:  Negative for abdominal distention, abdominal pain, blood in stool, diarrhea and nausea.  Endocrine: Negative for hot flashes.  Genitourinary:  Negative for difficulty urinating, dysuria and frequency.   Musculoskeletal:  Negative for arthralgias.  Skin:  Negative for itching and rash.  Neurological:  Negative for extremity weakness.  Hematological:  Negative for adenopathy. Does not bruise/bleed easily.  Psychiatric/Behavioral:  Negative for confusion.    MEDICAL HISTORY:  Past Medical History:  Diagnosis Date   Breast cancer in female [facility]) 05/08/2021   Chronic left shoulder pain    Coronary artery disease    Diabetes mellitus without complication ([place])    Dysrhythmia    Hypertension    Hypomagnesemia 09/13/2021   Paralysis ([place])    weakness left upper amd lower extremity   PONV (postoperative nausea and vomiting)    Stroke [facility])     SURGICAL HISTORY: Past Surgical History:  Procedure Laterality Date   BREAST BIOPSY Left 05/07/2021   12:00 5cmfn vision clip path pending   BREAST BIOPSY Left 05/07/2021   11:00 10 cmfn mini cork clip path pending   CAROTID PTA/STENT INTERVENTION Left 07/20/2019   Procedure: CAROTID PTA/STENT INTERVENTION;  Surgeon: Katha Cabal, MD;  Location: [facility];  Service: Cardiovascular;  Laterality: Left;   ECTOPIC PREGNANCY SURGERY     PORTACATH PLACEMENT N/A 06/06/2021   Procedure: INSERTION PORT-A-CATH;  Surgeon: Herbert Pun, MD;  Location: [facility] OR[facility];  Service: General;  Laterality: N/A;    SOCIAL HISTORY: Social History   Socioeconomic History   Marital status: Married    Spouse name: Not on file   Number of children: Not on file   Years of education: Not on file   Highest education level: Not on file  Occupational History   Not on file  Tobacco Use   Smoking status: Former    Packs/day: 0.50    Years: 15.00    Pack years: 7.50    Types: Cigarettes    Quit date: 04/11/2019     Years since quitting: 2.4   Smokeless tobacco: Never  Vaping Use   Vaping Use: Never used  Substance and Sexual Activity   Alcohol use: Never   Drug use: Never   Sexual activity: Not Currently  Other Topics Concern   Not on file  Social History Narrative   No kids    Married with husband    Used to work [employer] broadcast assistant    Social Determinants of Radio broadcast assistant Strain: Low Risk    Difficulty of Paying Living Expenses: Not hard at all  Food Insecurity: Not on file  Transportation Needs: Not on file  Physical Activity: Not on file  Stress: Not on file  Social Connections: Not on file  Intimate Partner Violence: Not on file    FAMILY HISTORY: Family History  Problem Relation Age of Onset   Diabetes type II Mother    Hypertension Father    Heart attack Father    Diabetes type II Brother    Breast cancer Neg Hx     ALLERGIES:  has No Known Allergies.  MEDICATIONS:  Current Outpatient Medications  Medication Sig Dispense Refill   amLODipine (NORVASC) 10  MG tablet Take 1 tablet by mouth once daily 90 tablet 0   aspirin EC 81 MG EC tablet Take 1 tablet (81 mg total) by mouth daily.     atorvastatin (LIPITOR) 80 MG tablet Take 1 tablet (80 mg total) by mouth daily. 90 tablet 3   blood glucose meter kit and supplies 1 each by Other route 4 (four) times daily. Dispense based on patient and insurance preference. Four times daily as directed. (FOR ICD-10 E10.9, E11.9). 1 each 0   carvedilol (COREG) 25 MG tablet Take 1 tablet (25 mg total) by mouth 2 (two) times daily with a meal. 180 tablet 3   ciprofloxacin (CIPRO) 250 MG tablet Take 1 tablet (250 mg total) by mouth 2 (two) times daily. 10 tablet 0   clopidogrel (PLAVIX) 75 MG tablet Take 1 tablet (75 mg total) by mouth daily. Further refills [facility] neurology 90 tablet 3   Continuous Blood Gluc Sensor (FREESTYLE LIBRE 2 SENSOR) MISC Use to check glucose at least every 8 hours 6 each 3   empagliflozin  (JARDIANCE) 25 MG TABS tablet Take 1 tablet (25 mg total) by mouth daily. 90 tablet 3   lidocaine-prilocaine (EMLA) cream Apply to affected area once 30 g 3   lisinopril (ZESTRIL) 40 MG tablet Take 1 tablet (40 mg total) by mouth daily. 90 tablet 3   loperamide (IMODIUM) 2 MG capsule Take 1 capsule (2 mg total) by mouth See admin instructions. Take 4mg  at the onset of diarrhea, and then 2mg  after each loose bowel movement, maximum 16mg  per 24 hours. 30 capsule 1   magnesium chloride (SLOW-MAG) 64 MG TBEC SR tablet Take 1 tablet (64 mg total) by mouth daily. 30 tablet 1   metFORMIN (GLUCOPHAGE XR) 500 MG 24 hr tablet Take 2 tablets (1,000 mg total) by mouth daily with breakfast. 180 tablet 3   ondansetron (ZOFRAN) 8 MG tablet Take 1 tablet (8 mg total) by mouth 2 (two) times daily as needed. Start on the third day after chemotherapy. 30 tablet 1   vitamin B-12 (CYANOCOBALAMIN) 1000 MCG tablet Take 1 tablet (1,000 mcg total) by mouth daily. 30 tablet 1   Vitamin D, Cholecalciferol, 50 MCG (2000 UT) CAPS Take 2,000 Units by mouth.     loratadine (CLARITIN) 10 MG tablet Take 10 mg by mouth daily. (Patient not taking: Reported on 10/03/2021)     potassium chloride SA (KLOR-CON M20) 20 MEQ tablet Take 1 tablet (20 mEq total) by mouth daily. (Patient not taking: Reported on 10/03/2021) 90 tablet 3   prochlorperazine (COMPAZINE) 10 MG tablet Take 1 tablet (10 mg total) by mouth every 6 (six) hours as needed (Nausea or vomiting). (Patient not taking: Reported on 10/03/2021) 30 tablet 1   No current facility-administered medications for this visit.   Facility-Administered Medications Ordered in Other Visits  Medication Dose Route Frequency Provider Last Rate Last Admin   heparin lock flush 100 UNIT/ML injection              PHYSICAL EXAMINATION: ECOG PERFORMANCE STATUS: 1 - Symptomatic but completely ambulatory Vitals:   10/03/21 1345  BP: (!) 110/55  Pulse: 80  Temp: 99 F (37.2 C)  SpO2: 100%    Filed Weights   10/03/21 1345  Weight: 127 lb (57.6 kg)    Physical Exam Constitutional:      General: She is not in acute distress. HENT:     Head: Normocephalic and atraumatic.  Eyes:     General: No scleral  icterus. Cardiovascular:     Rate and Rhythm: Normal rate and regular rhythm.     Heart sounds: Normal heart sounds.  Pulmonary:     Effort: Pulmonary effort is normal. No respiratory distress.     Breath sounds: No wheezing.  Abdominal:     General: Bowel sounds are normal. There is no distension.     Palpations: Abdomen is soft.  Musculoskeletal:        General: No deformity. Normal range of motion.     Cervical back: Normal range of motion and neck supple.  Skin:    General: Skin is warm and dry.     Findings: No erythema or rash.  Neurological:     Mental Status: She is alert and oriented to person, place, and time. Mental status is at baseline.     Comments: Chronic left upper and lower extremity weakness.  Psychiatric:        Mood and Affect: Mood normal.      LABORATORY DATA:  I have reviewed the data as listed Lab Results  Component Value Date   WBC 5.2 10/03/2021   HGB 9.6 (L) 10/03/2021   HCT 30.5 (L) 10/03/2021   MCV 98.4 10/03/2021   PLT 153 10/03/2021   Recent Labs    09/20/21 0808 09/27/21 0957 09/30/21 0924 10/03/21 1350  NA 136 136 137 139  K 4.7 5.4* 4.2 3.6  CL 108 113* 114* 115*  CO2 20* 15* 15* 16*  GLUCOSE 137* 115* 115* 107*  BUN 24* 33* 14 15  CREATININE 1.05* 1.33* 1.08* 0.93  CALCIUM 9.4 9.1 8.6* 8.8*  GFRNONAA 57* 43* 55* >60  PROT 6.6 6.3*  --  5.9*  ALBUMIN 4.0 3.7  --  3.4*  AST 15 15  --  16  ALT 23 13  --  15  ALKPHOS 51 48  --  47  BILITOT 0.8 0.6  --  0.5    Iron/TIBC/Ferritin/ %Sat    Component Value Date/Time   IRON 113 12/08/2019 1015   TIBC 317 12/08/2019 1015   FERRITIN 67 12/08/2019 1015   IRONPCTSAT 36 12/08/2019 1015       RADIOGRAPHIC STUDIES: I have personally reviewed the radiological  images as listed and agreed with the findings in the report. MR BREAST BILATERAL W [place] CAD  Result Date: 08/26/2021 CLINICAL DATA:  Evaluate treatment response. Patient has completed 5 weeks of chemotherapy. Ultrasound biopsy of a conglomerate of masses in the LEFT breast 12 o'clock 5 centimeters from nipple showed invasive mammary carcinoma. Biopsy of LEFT breast 11 o'clock location 10 centimeters from the nipple showed invasive mammary carcinoma. Additional masses in the LEFT breast identified with ultrasound were not biopsied. LABS:  None obtained at the time of imaging. EXAM: BILATERAL BREAST MRI WITH AND WITHOUT CONTRAST TECHNIQUE: Multiplanar, multisequence MR images of both breasts were obtained prior to and following the intravenous administration of 6 ml of Gadavist Three-dimensional MR images were rendered by post-processing of the original MR data on an independent workstation. The three-dimensional MR images were interpreted, and findings are reported in the following complete MRI report for this study. Three dimensional images were evaluated at the independent interpreting workstation using the DynaCAD thin client. COMPARISON:  MRI on 05/22/2021 FINDINGS: Breast composition: c. Heterogeneous fibroglandular tissue. Background parenchymal enhancement: Minimal Right breast: No mass or abnormal enhancement. Interval placement of RIGHT-sided Port-A-Cath. Left breast: Numerous masses and adjacent non mass enhancement in the UPPER LEFT breast spans 9.1 x  3.5 x 3.4 centimeters. Previously, mass measured 8.3 x 4.2 x 4.0 centimeters. Mass shows rapid washout type enhancement kinetics. Non mass enhancement is again noted to extend to the LEFT pectoralis muscle, without pectoralis enhancement. Lymph nodes: No abnormal appearing lymph nodes. Ancillary findings:  None. IMPRESSION: 1. Masses and non mass enhancement in the UPPER LEFT breast, similar in appearance and size compared to prior study. 2.  RIGHT breast remains negative. RECOMMENDATION: Treatment plan for LEFT breast malignancy. BI-RADS CATEGORY  6: Known biopsy-proven malignancy. Electronically Signed   By: Nolon Nations M.D.   On: 08/26/2021 13:49      ASSESSMENT & PLAN:  1. Hypomagnesemia   2. Hypokalemia   3. Malignant neoplasm of left breast in female, estrogen receptor positive, unspecified site of breast ([place])   4. Antineoplastic chemotherapy induced anemia   Cancer Staging Invasive carcinoma of breast ([place]) Staging form: Breast, AJCC 8th Edition - Clinical stage from 05/16/2021: Stage IIB (cT3, cN0, cM0, G3, ER+, PR+, HER2-) - Signed by [name], MD on 06/03/2021  #Left breast invasive carcinoma, multiple sites.  ER 90%, PR 11-50% positive, HER2 negative by IHC cT3 N0 On neoadjuvant chemotherapy, finished 4 cycles of ddAC Interval MRI showed stable size. Labs reviewed and discussed with patient. She has clinically improved. We discussed about option of resuming chemotherapy next week or wait until after Thanksgiving.  Patient prefers to take a break and resume treatment after Thanksgiving break.  #Hypotension, blood pressure has improved.  Patient received 1 L of IV fluid normal saline today.  #Elevated creatinine/ AKI , resolved.  Encourage oral hydration.  #Hypomagnesia, levels 1.6.  Recommend patient to continue Slow-Mag 1 tablet daily. #Chemotherapy-induced diarrhea, resolved.   #Chemotherapy-induced anemia, stable hemoglobin  .   #Hypokalemia, potassium is normal today.  Follow up 10/25/2021 lab MD Taxol.  Patient prefers Fridays  All questions were answered. The patient knows to call the clinic with any problems questions or concerns.  cc McLean-Scocuzza, Olam Idler, MD, PhD 10/03/2021

## 2021-10-03 NOTE — Patient Instructions (Signed)

## 2021-10-05 ENCOUNTER — Other Ambulatory Visit: Payer: Self-pay | Admitting: Internal Medicine

## 2021-10-05 DIAGNOSIS — I10 Essential (primary) hypertension: Secondary | ICD-10-CM

## 2021-10-05 DIAGNOSIS — I639 Cerebral infarction, unspecified: Secondary | ICD-10-CM

## 2021-10-05 DIAGNOSIS — E785 Hyperlipidemia, unspecified: Secondary | ICD-10-CM

## 2021-10-09 ENCOUNTER — Encounter: Payer: Self-pay | Admitting: Internal Medicine

## 2021-10-09 ENCOUNTER — Telehealth: Payer: Self-pay | Admitting: Pharmacist

## 2021-10-09 ENCOUNTER — Other Ambulatory Visit: Payer: Self-pay

## 2021-10-09 ENCOUNTER — Ambulatory Visit (INDEPENDENT_AMBULATORY_CARE_PROVIDER_SITE_OTHER): Payer: PPO | Admitting: Internal Medicine

## 2021-10-09 VITALS — BP 146/78 | HR 78 | Temp 97.1°F | Ht 61.0 in | Wt 129.4 lb

## 2021-10-09 DIAGNOSIS — E1165 Type 2 diabetes mellitus with hyperglycemia: Secondary | ICD-10-CM | POA: Diagnosis not present

## 2021-10-09 DIAGNOSIS — I639 Cerebral infarction, unspecified: Secondary | ICD-10-CM | POA: Diagnosis not present

## 2021-10-09 DIAGNOSIS — E559 Vitamin D deficiency, unspecified: Secondary | ICD-10-CM | POA: Diagnosis not present

## 2021-10-09 DIAGNOSIS — Z1389 Encounter for screening for other disorder: Secondary | ICD-10-CM

## 2021-10-09 DIAGNOSIS — R319 Hematuria, unspecified: Secondary | ICD-10-CM

## 2021-10-09 DIAGNOSIS — E1159 Type 2 diabetes mellitus with other circulatory complications: Secondary | ICD-10-CM | POA: Diagnosis not present

## 2021-10-09 DIAGNOSIS — I10 Essential (primary) hypertension: Secondary | ICD-10-CM | POA: Diagnosis not present

## 2021-10-09 DIAGNOSIS — E785 Hyperlipidemia, unspecified: Secondary | ICD-10-CM

## 2021-10-09 DIAGNOSIS — I152 Hypertension secondary to endocrine disorders: Secondary | ICD-10-CM

## 2021-10-09 DIAGNOSIS — Z0000 Encounter for general adult medical examination without abnormal findings: Secondary | ICD-10-CM | POA: Diagnosis not present

## 2021-10-09 DIAGNOSIS — E119 Type 2 diabetes mellitus without complications: Secondary | ICD-10-CM | POA: Diagnosis not present

## 2021-10-09 HISTORY — DX: Encounter for general adult medical examination without abnormal findings: Z00.00

## 2021-10-09 LAB — LIPID PANEL
Cholesterol: 143 mg/dL (ref 0–200)
HDL: 44.1 mg/dL (ref >39.00)
LDL Cholesterol: 76 mg/dL (ref 0–99)
NonHDL: 99.3
Total CHOL/HDL Ratio: 3
Triglycerides: 115 mg/dL (ref 0.0–149.0)
VLDL: 23 mg/dL (ref 0.0–40.0)

## 2021-10-09 LAB — VITAMIN D 25 HYDROXY (VIT D DEFICIENCY, FRACTURES): VITD: 64.88 ng/mL (ref 30.00–100.00)

## 2021-10-09 LAB — HEMOGLOBIN A1C: Hgb A1c MFr Bld: 5.5 % (ref 4.6–6.5)

## 2021-10-09 MED ORDER — ATORVASTATIN CALCIUM 80 MG PO TABS: 80.0000 mg | ORAL_TABLET | Freq: Every day | ORAL | 3 refills | Status: DC

## 2021-10-09 MED ORDER — ASPIRIN 81 MG PO TBEC: 81.0000 mg | DELAYED_RELEASE_TABLET | Freq: Every day | ORAL | 3 refills | Status: DC

## 2021-10-09 MED ORDER — LISINOPRIL 40 MG PO TABS
40.0000 mg | ORAL_TABLET | Freq: Every day | ORAL | 3 refills | Status: DC
Start: 2021-10-09 — End: 2022-08-05

## 2021-10-09 MED ORDER — LORATADINE 10 MG PO TABS: 10.0000 mg | ORAL_TABLET | Freq: Every day | ORAL | 3 refills | Status: DC

## 2021-10-09 MED ORDER — EMPAGLIFLOZIN 25 MG PO TABS: 25.0000 mg | ORAL_TABLET | Freq: Every day | ORAL | 3 refills | Status: DC

## 2021-10-09 MED ORDER — CLOPIDOGREL BISULFATE 75 MG PO TABS: 75.0000 mg | ORAL_TABLET | Freq: Every day | ORAL | 3 refills | Status: DC

## 2021-10-09 MED ORDER — METFORMIN HCL ER 500 MG PO TB24
1000.0000 mg | ORAL_TABLET | Freq: Every day | ORAL | 3 refills | Status: DC
Start: 2021-10-09 — End: 2021-10-11

## 2021-10-09 MED ORDER — AMLODIPINE BESYLATE 10 MG PO TABS: 10.0000 mg | ORAL_TABLET | Freq: Every day | ORAL | 3 refills | Status: DC

## 2021-10-09 NOTE — Progress Notes (Signed)
Chief Complaint  Patient presents with   Follow-up   Annual Exam   Annual  1. Needs handicap form filled out  2. Breast cancer actively doing chemo at times causes low appetite but wt stable and diarrhea  3. Dm 2 with htn on norvasc 10 mg qd, coreg 25 mg bid, jardiance 25 mg qd and lis 40 and metfomin xr 1000 mg qd   Review of Systems  Constitutional:  Negative for weight loss.  HENT:  Negative for hearing loss.   Eyes:  Negative for blurred vision.  Respiratory:  Negative for shortness of breath.   Cardiovascular:  Negative for chest pain.  Gastrointestinal:  Positive for diarrhea. Negative for abdominal pain and blood in stool.       Low appetite  Genitourinary:  Negative for dysuria.  Musculoskeletal:  Negative for falls and joint pain.  Skin:  Negative for rash.  Neurological:  Negative for headaches.  Psychiatric/Behavioral:  Negative for depression.   Past Medical History:  Diagnosis Date   Breast cancer in female [facility]) 05/08/2021   Chronic left shoulder pain    Coronary artery disease    Diabetes mellitus without complication ([place])    Dysrhythmia    Hypertension    Hypomagnesemia 09/13/2021   Paralysis ([place])    weakness left upper amd lower extremity   PONV (postoperative nausea and vomiting)    Stroke [facility])    Past Surgical History:  Procedure Laterality Date   BREAST BIOPSY Left 05/07/2021   12:00 5cmfn vision clip path pending   BREAST BIOPSY Left 05/07/2021   11:00 10 cmfn mini cork clip path pending   CAROTID PTA/STENT INTERVENTION Left 07/20/2019   Procedure: CAROTID PTA/STENT INTERVENTION;  Surgeon: Katha Cabal, MD;  Location: [facility];  Service: Cardiovascular;  Laterality: Left;   ECTOPIC PREGNANCY SURGERY     PORTACATH PLACEMENT N/A 06/06/2021   Procedure: INSERTION PORT-A-CATH;  Surgeon: Herbert Pun, MD;  Location: [facility];  Service: General;  Laterality: N/A;   Family History  Problem Relation Age of Onset   Diabetes  type II Mother    Hypertension Father    Heart attack Father    Diabetes type II Brother    Breast cancer Neg Hx    Social History   Socioeconomic History   Marital status: Married    Spouse name: Not on file   Number of children: Not on file   Years of education: Not on file   Highest education level: Not on file  Occupational History   Not on file  Tobacco Use   Smoking status: Former    Packs/day: 0.50    Years: 15.00    Pack years: 7.50    Types: Cigarettes    Quit date: 04/11/2019    Years since quitting: 2.4   Smokeless tobacco: Never  Vaping Use   Vaping Use: Never used  Substance and Sexual Activity   Alcohol use: Never   Drug use: Never   Sexual activity: Not Currently  Other Topics Concern   Not on file  Social History Narrative   No kids    Married with husband    Used to work [employer] broadcast assistant    Social Determinants of Radio broadcast assistant Strain: Low Risk    Difficulty of Paying Living Expenses: Not hard at all  Food Insecurity: Not on file  Transportation Needs: Not on file  Physical Activity: Not on file  Stress: Not on file  Social Connections: Not on file  Intimate Partner Violence: Not on file   Current Meds  Medication Sig   blood glucose meter kit and supplies 1 each by Other route 4 (four) times daily. Dispense based on patient and insurance preference. Four times daily as directed. (FOR ICD-10 E10.9, E11.9).   carvedilol (COREG) 25 MG tablet Take 1 tablet (25 mg total) by mouth 2 (two) times daily with a meal.   Continuous Blood Gluc Sensor (FREESTYLE LIBRE 2 SENSOR) MISC Use to check glucose at least every 8 hours   lidocaine-prilocaine (EMLA) cream Apply to affected area once   loperamide (IMODIUM) 2 MG capsule Take 1 capsule (2 mg total) by mouth See admin instructions. Take 46m at the onset of diarrhea, and then 265mafter each loose bowel movement, maximum 1655mer 24 hours.   magnesium chloride (SLOW-MAG) 64 MG  TBEC SR tablet Take 1 tablet (64 mg total) by mouth daily.   ondansetron (ZOFRAN) 8 MG tablet Take 1 tablet (8 mg total) by mouth 2 (two) times daily as needed. Start on the third day after chemotherapy.   prochlorperazine (COMPAZINE) 10 MG tablet Take 1 tablet (10 mg total) by mouth every 6 (six) hours as needed (Nausea or vomiting).   vitamin B-12 (CYANOCOBALAMIN) 1000 MCG tablet Take 1 tablet (1,000 mcg total) by mouth daily.   Vitamin D, Cholecalciferol, 50 MCG (2000 UT) CAPS Take 2,000 Units by mouth.   [DISCONTINUED] amLODipine (NORVASC) 10 MG tablet Take 1 tablet by mouth once daily   [DISCONTINUED] aspirin EC 81 MG EC tablet Take 1 tablet (81 mg total) by mouth daily.   [DISCONTINUED] atorvastatin (LIPITOR) 80 MG tablet Take 1 tablet by mouth once daily   [DISCONTINUED] clopidogrel (PLAVIX) 75 MG tablet Take 1 tablet (75 mg total) by mouth daily. Further refills [facility]   [DISCONTINUED] empagliflozin (JARDIANCE) 25 MG TABS tablet Take 1 tablet (25 mg total) by mouth daily.   [DISCONTINUED] lisinopril (ZESTRIL) 40 MG tablet Take 1 tablet (40 mg total) by mouth daily.   [DISCONTINUED] loratadine (CLARITIN) 10 MG tablet Take 10 mg by mouth daily.   [DISCONTINUED] metFORMIN (GLUCOPHAGE XR) 500 MG 24 hr tablet Take 2 tablets (1,000 mg total) by mouth daily with breakfast.   No Known Allergies Recent Results (from the past 2160 hour(s))  Comprehensive metabolic panel     Status: Abnormal   Collection Time: 07/12/21 10:25 AM  Result Value Ref Range   Sodium 139 135 - 145 mmol/L   Potassium 3.8 3.5 - 5.1 mmol/L   Chloride 109 98 - 111 mmol/L   CO2 22 22 - 32 mmol/L   Glucose, Bld 106 (H) 70 - 99 mg/dL    Comment: Glucose reference range applies only to samples taken after fasting for at least 8 hours.   BUN 20 8 - 23 mg/dL   Creatinine, Ser 0.97 0.44 - 1.00 mg/dL   Calcium 9.0 8.9 - 10.3 mg/dL   Total Protein 7.2 6.5 - 8.1 g/dL   Albumin 4.0 3.5 - 5.0 g/dL   AST 16 15 - 41 U/L    ALT 21 0 - 44 U/L   Alkaline Phosphatase 70 38 - 126 U/L   Total Bilirubin 0.5 0.3 - 1.2 mg/dL   GFR, Estimated >60 >60 mL/min    Comment: (NOTE) Calculated using the CKD-EPI Creatinine Equation (2021)    Anion gap 8 5 - 15    Comment: [facility] [address]  CBC with Differential     Status: Abnormal   Collection Time: 07/12/21 10:25 AM  Result Value Ref Range   WBC 12.5 (H) 4.0 - 10.5 K/uL   RBC 3.81 (L) 3.87 - 5.11 MIL/uL   Hemoglobin 11.6 (L) 12.0 - 15.0 g/dL   HCT 35.7 (L) 36.0 - 46.0 %   MCV 93.7 80.0 - 100.0 fL   MCH 30.4 26.0 - 34.0 pg   MCHC 32.5 30.0 - 36.0 g/dL   RDW 14.1 11.5 - 15.5 %   Platelets 269 150 - 400 K/uL   nRBC 0.9 (H) 0.0 - 0.2 %   Neutrophils Relative % 65 %   Neutro Abs 8.2 (H) 1.7 - 7.7 K/uL   Lymphocytes Relative 20 %   Lymphs Abs 2.4 0.7 - 4.0 K/uL   Monocytes Relative 10 %   Monocytes Absolute 1.2 (H) 0.1 - 1.0 K/uL   Eosinophils Relative 0 %   Eosinophils Absolute 0.1 0.0 - 0.5 K/uL   Basophils Relative 1 %   Basophils Absolute 0.1 0.0 - 0.1 K/uL   Immature Granulocytes 4 %   Abs Immature Granulocytes 0.52 (H) 0.00 - 0.07 K/uL    Comment: Performed at [facility], [address] [place], [place] [address]  Comprehensive metabolic panel     Status: Abnormal   Collection Time: 08/02/21  8:23 AM  Result Value Ref Range   Sodium 140 135 - 145 mmol/L   Potassium 4.7 3.5 - 5.1 mmol/L   Chloride 111 98 - 111 mmol/L   CO2 20 (L) 22 - 32 mmol/L   Glucose, Bld 113 (H) 70 - 99 mg/dL    Comment: Glucose reference range applies only to samples taken after fasting for at least 8 hours.   BUN 28 (H) 8 - 23 mg/dL   Creatinine, Ser 0.87 0.44 - 1.00 mg/dL   Calcium 9.2 8.9 - 10.3 mg/dL   Total Protein 7.4 6.5 - 8.1 g/dL   Albumin 4.1 3.5 - 5.0 g/dL   AST 13 (L) 15 - 41 U/L   ALT 15 0 - 44 U/L   Alkaline Phosphatase 77 38 - 126 U/L   Total Bilirubin 0.2 (L) 0.3 - 1.2 mg/dL   GFR, Estimated  >60 >60 mL/min    Comment: (NOTE) Calculated using the CKD-EPI Creatinine Equation (2021)    Anion gap 9 5 - 15    Comment: Performed at [facility], [address] [place], [place] [address]  CBC with Differential     Status: Abnormal   Collection Time: 08/02/21  8:23 AM  Result Value Ref Range   WBC 14.0 (H) 4.0 - 10.5 K/uL   RBC 3.49 (L) 3.87 - 5.11 MIL/uL   Hemoglobin 10.8 (L) 12.0 - 15.0 g/dL   HCT 33.3 (L) 36.0 - 46.0 %   MCV 95.4 80.0 - 100.0 fL   MCH 30.9 26.0 - 34.0 pg   MCHC 32.4 30.0 - 36.0 g/dL   RDW 14.6 11.5 - 15.5 %   Platelets 230 150 - 400 K/uL   nRBC 0.6 (H) 0.0 - 0.2 %   Neutrophils Relative % 74 %   Neutro Abs 10.5 (H) 1.7 - 7.7 K/uL   Lymphocytes Relative 12 %   Lymphs Abs 1.7 0.7 - 4.0 K/uL   Monocytes Relative 9 %   Monocytes Absolute 1.3 (H) 0.1 - 1.0 K/uL   Eosinophils Relative 1 %   Eosinophils Absolute 0.1 0.0 - 0.5 K/uL  Basophils Relative 1 %   Basophils Absolute 0.1 0.0 - 0.1 K/uL   Immature Granulocytes 3 %   Abs Immature Granulocytes 0.41 (H) 0.00 - 0.07 K/uL    Comment: Performed at [facility], [address] [place], [place] [address]  CBC with Differential     Status: Abnormal   Collection Time: 08/16/21  7:52 AM  Result Value Ref Range   WBC 30.2 (H) 4.0 - 10.5 K/uL   RBC 3.22 (L) 3.87 - 5.11 MIL/uL   Hemoglobin 10.0 (L) 12.0 - 15.0 g/dL   HCT 30.4 (L) 36.0 - 46.0 %   MCV 94.4 80.0 - 100.0 fL   MCH 31.1 26.0 - 34.0 pg   MCHC 32.9 30.0 - 36.0 g/dL   RDW 14.7 11.5 - 15.5 %   Platelets 160 150 - 400 K/uL   nRBC 0.5 (H) 0.0 - 0.2 %   Neutrophils Relative % 72 %   Neutro Abs 21.6 (H) 1.7 - 7.7 K/uL   Lymphocytes Relative 6 %   Lymphs Abs 1.9 0.7 - 4.0 K/uL   Monocytes Relative 9 %   Monocytes Absolute 2.8 (H) 0.1 - 1.0 K/uL   Eosinophils Relative 0 %   Eosinophils Absolute 0.0 0.0 - 0.5 K/uL   Basophils Relative 0 %   Basophils Absolute 0.1 0.0 - 0.1 K/uL   WBC Morphology MILD LEFT SHIFT (1-5% METAS, OCC MYELO,  OCC BANDS)     Comment: DIFF CONFIRMED BY MANUAL   RBC Morphology UNREMARKABLE    Smear Review Normal platelet morphology     Comment: PLATELETS APPEAR ADEQUATE   Immature Granulocytes 13 %    Comment: Increased IG's, likely caused by Bone Marrow Colony Stimulating Factor received within 30 days.   Abs Immature Granulocytes 3.91 (H) 0.00 - 0.07 K/uL    Comment: Performed at [facility], [address] [place], [place] [address]  Comprehensive metabolic panel     Status: Abnormal   Collection Time: 08/16/21  7:52 AM  Result Value Ref Range   Sodium 140 135 - 145 mmol/L   Potassium 3.6 3.5 - 5.1 mmol/L   Chloride 112 (H) 98 - 111 mmol/L   CO2 18 (L) 22 - 32 mmol/L   Glucose, Bld 131 (H) 70 - 99 mg/dL    Comment: Glucose reference range applies only to samples taken after fasting for at least 8 hours.   BUN 18 8 - 23 mg/dL   Creatinine, Ser 0.75 0.44 - 1.00 mg/dL   Calcium 9.5 8.9 - 10.3 mg/dL   Total Protein 6.7 6.5 - 8.1 g/dL   Albumin 3.9 3.5 - 5.0 g/dL   AST 12 (L) 15 - 41 U/L   ALT 13 0 - 44 U/L   Alkaline Phosphatase 99 38 - 126 U/L   Total Bilirubin 0.5 0.3 - 1.2 mg/dL   GFR, Estimated >60 >60 mL/min    Comment: (NOTE) Calculated using the CKD-EPI Creatinine Equation (2021)    Anion gap 10 5 - 15    Comment: Performed at [facility], [address] [place], [place] [address]  Comprehensive metabolic panel     Status: Abnormal   Collection Time: 08/30/21  8:50 AM  Result Value Ref Range   Sodium 140 135 - 145 mmol/L   Potassium 3.3 (L) 3.5 - 5.1 mmol/L   Chloride 110 98 - 111 mmol/L   CO2 21 (L) 22 - 32 mmol/L   Glucose, Bld 131 (H) 70 - 99  mg/dL    Comment: Glucose reference range applies only to samples taken after fasting for at least 8 hours.   BUN 12 8 - 23 mg/dL   Creatinine, Ser 0.81 0.44 - 1.00 mg/dL   Calcium 9.1 8.9 - 10.3 mg/dL   Total Protein 6.7 6.5 - 8.1 g/dL   Albumin 3.6 3.5 - 5.0 g/dL   AST 12 (L) 15 - 41 U/L   ALT 11 0 - 44 U/L    Alkaline Phosphatase 86 38 - 126 U/L   Total Bilirubin 0.9 0.3 - 1.2 mg/dL   GFR, Estimated >60 >60 mL/min    Comment: (NOTE) Calculated using the CKD-EPI Creatinine Equation (2021)    Anion gap 9 5 - 15    Comment: Performed at [facility], [address] [place], [place] [address]  CBC with Differential     Status: Abnormal   Collection Time: 08/30/21  8:50 AM  Result Value Ref Range   WBC 22.7 (H) 4.0 - 10.5 K/uL   RBC 2.73 (L) 3.87 - 5.11 MIL/uL   Hemoglobin 8.5 (L) 12.0 - 15.0 g/dL   HCT 26.2 (L) 36.0 - 46.0 %   MCV 96.0 80.0 - 100.0 fL   MCH 31.1 26.0 - 34.0 pg   MCHC 32.4 30.0 - 36.0 g/dL   RDW 15.5 11.5 - 15.5 %   Platelets 148 (L) 150 - 400 K/uL   nRBC 1.6 (H) 0.0 - 0.2 %   Neutrophils Relative % 67 %   Neutro Abs 15.1 (H) 1.7 - 7.7 K/uL   Lymphocytes Relative 6 %   Lymphs Abs 1.3 0.7 - 4.0 K/uL   Monocytes Relative 10 %   Monocytes Absolute 2.3 (H) 0.1 - 1.0 K/uL   Eosinophils Relative 0 %   Eosinophils Absolute 0.0 0.0 - 0.5 K/uL   Basophils Relative 0 %   Basophils Absolute 0.0 0.0 - 0.1 K/uL   WBC Morphology      MARKED LEFT SHIFT (>5% METAS,MYELOS AND PROS, OCC BLAST NOTED)    Comment: DIFF CONFIRMED BY MANUAL.   RBC Morphology MIXED RBC POPULATION.    Smear Review Normal platelet morphology     Comment: PLATELETS APPEAR ADEQUATE   Immature Granulocytes 17 %    Comment: Increased IG's, likely caused by Bone Marrow Colony Stimulating Factor received within 30 days.   Abs Immature Granulocytes 3.95 (H) 0.00 - 0.07 K/uL   Acanthocytes PRESENT    Polychromasia PRESENT     Comment: Performed at [facility], [address] [place], [place] [address]  Magnesium     Status: Abnormal   Collection Time: 09/06/21  8:08 AM  Result Value Ref Range   Magnesium 1.3 (L) 1.7 - 2.4 mg/dL    Comment: Performed at [facility], [address] [place], [place] [address]  Comprehensive metabolic panel     Status: Abnormal   Collection Time: 09/06/21   8:08 AM  Result Value Ref Range   Sodium 138 135 - 145 mmol/L   Potassium 3.0 (L) 3.5 - 5.1 mmol/L   Chloride 108 98 - 111 mmol/L   CO2 20 (L) 22 - 32 mmol/L   Glucose, Bld 129 (H) 70 - 99 mg/dL    Comment: Glucose reference range applies only to samples taken after fasting for at least 8 hours.   BUN 21 8 - 23 mg/dL   Creatinine, Ser 0.87 0.44 - 1.00 mg/dL   Calcium 8.5 (L) 8.9 - 10.3 mg/dL  Total Protein 6.6 6.5 - 8.1 g/dL   Albumin 4.0 3.5 - 5.0 g/dL   AST 21 15 - 41 U/L   ALT 34 0 - 44 U/L   Alkaline Phosphatase 56 38 - 126 U/L   Total Bilirubin 0.5 0.3 - 1.2 mg/dL   GFR, Estimated >60 >60 mL/min    Comment: (NOTE) Calculated using the CKD-EPI Creatinine Equation (2021)    Anion gap 10 5 - 15    Comment: Performed at [facility], [address] [place], [place] [address]  CBC with Differential     Status: Abnormal   Collection Time: 09/06/21  8:08 AM  Result Value Ref Range   WBC 7.6 4.0 - 10.5 K/uL   RBC 3.06 (L) 3.87 - 5.11 MIL/uL   Hemoglobin 9.5 (L) 12.0 - 15.0 g/dL   HCT 29.5 (L) 36.0 - 46.0 %   MCV 96.4 80.0 - 100.0 fL   MCH 31.0 26.0 - 34.0 pg   MCHC 32.2 30.0 - 36.0 g/dL   RDW 15.8 (H) 11.5 - 15.5 %   Platelets 321 150 - 400 K/uL   nRBC 13.6 (H) 0.0 - 0.2 %   Neutrophils Relative % 71 %   Neutro Abs 5.4 1.7 - 7.7 K/uL   Lymphocytes Relative 15 %   Lymphs Abs 1.1 0.7 - 4.0 K/uL   Monocytes Relative 9 %   Monocytes Absolute 0.7 0.1 - 1.0 K/uL   Eosinophils Relative 0 %   Eosinophils Absolute 0.0 0.0 - 0.5 K/uL   Basophils Relative 1 %   Basophils Absolute 0.1 0.0 - 0.1 K/uL   Immature Granulocytes 4 %   Abs Immature Granulocytes 0.32 (H) 0.00 - 0.07 K/uL    Comment: Performed at [facility], [address] [place], [place] [address]  Comprehensive metabolic panel     Status: Abnormal   Collection Time: 09/13/21  8:23 AM  Result Value Ref Range   Sodium 138 135 - 145 mmol/L   Potassium 3.4 (L) 3.5 - 5.1 mmol/L   Chloride 106 98 - 111  mmol/L   CO2 21 (L) 22 - 32 mmol/L   Glucose, Bld 126 (H) 70 - 99 mg/dL    Comment: Glucose reference range applies only to samples taken after fasting for at least 8 hours.   BUN 12 8 - 23 mg/dL   Creatinine, Ser 0.80 0.44 - 1.00 mg/dL   Calcium 9.4 8.9 - 10.3 mg/dL   Total Protein 6.3 (L) 6.5 - 8.1 g/dL   Albumin 3.6 3.5 - 5.0 g/dL   AST 19 15 - 41 U/L   ALT 19 0 - 44 U/L   Alkaline Phosphatase 50 38 - 126 U/L   Total Bilirubin 0.8 0.3 - 1.2 mg/dL   GFR, Estimated >60 >60 mL/min    Comment: (NOTE) Calculated using the CKD-EPI Creatinine Equation (2021)    Anion gap 11 5 - 15    Comment: Performed at [facility], [address] [place], [place] [address]  CBC with Differential     Status: Abnormal   Collection Time: 09/13/21  8:23 AM  Result Value Ref Range   WBC 5.9 4.0 - 10.5 K/uL   RBC 3.05 (L) 3.87 - 5.11 MIL/uL   Hemoglobin 9.4 (L) 12.0 - 15.0 g/dL   HCT 30.5 (L) 36.0 - 46.0 %   MCV 100.0 80.0 - 100.0 fL   MCH 30.8 26.0 - 34.0 pg   MCHC 30.8 30.0 - 36.0 g/dL  RDW 18.6 (H) 11.5 - 15.5 %   Platelets 245 150 - 400 K/uL   nRBC 2.0 (H) 0.0 - 0.2 %   Neutrophils Relative % 63 %   Neutro Abs 3.8 1.7 - 7.7 K/uL   Lymphocytes Relative 16 %   Lymphs Abs 1.0 0.7 - 4.0 K/uL   Monocytes Relative 17 %   Monocytes Absolute 1.0 0.1 - 1.0 K/uL   Eosinophils Relative 1 %   Eosinophils Absolute 0.0 0.0 - 0.5 K/uL   Basophils Relative 1 %   Basophils Absolute 0.0 0.0 - 0.1 K/uL   Immature Granulocytes 2 %   Abs Immature Granulocytes 0.14 (H) 0.00 - 0.07 K/uL    Comment: Performed at [facility], [address] [place], [place] [address]  Magnesium     Status: Abnormal   Collection Time: 09/13/21  8:23 AM  Result Value Ref Range   Magnesium 1.5 (L) 1.7 - 2.4 mg/dL    Comment: Performed at [facility], [address] [place], [place] [address]  Comprehensive metabolic panel     Status: Abnormal   Collection Time: 09/20/21  8:08 AM  Result Value Ref  Range   Sodium 136 135 - 145 mmol/L   Potassium 4.7 3.5 - 5.1 mmol/L   Chloride 108 98 - 111 mmol/L   CO2 20 (L) 22 - 32 mmol/L   Glucose, Bld 137 (H) 70 - 99 mg/dL    Comment: Glucose reference range applies only to samples taken after fasting for at least 8 hours.   BUN 24 (H) 8 - 23 mg/dL   Creatinine, Ser 1.05 (H) 0.44 - 1.00 mg/dL   Calcium 9.4 8.9 - 10.3 mg/dL   Total Protein 6.6 6.5 - 8.1 g/dL   Albumin 4.0 3.5 - 5.0 g/dL   AST 15 15 - 41 U/L   ALT 23 0 - 44 U/L   Alkaline Phosphatase 51 38 - 126 U/L   Total Bilirubin 0.8 0.3 - 1.2 mg/dL   GFR, Estimated 57 (L) >60 mL/min    Comment: (NOTE) Calculated using the CKD-EPI Creatinine Equation (2021)    Anion gap 8 5 - 15    Comment: Performed at [facility], [address] [place],  [address]  CBC with Differential     Status: Abnormal   Collection Time: 09/20/21  8:08 AM  Result Value Ref Range   WBC 4.6 4.0 - 10.5 K/uL   RBC 3.34 (L) 3.87 - 5.11 MIL/uL   Hemoglobin 10.3 (L) 12.0 - 15.0 g/dL   HCT 32.7 (L) 36.0 - 46.0 %   MCV 97.9 80.0 - 100.0 fL   MCH 30.8 26.0 - 34.0 pg   MCHC 31.5 30.0 - 36.0 g/dL   RDW 16.9 (H) 11.5 - 15.5 %   Platelets 250 150 - 400 K/uL   nRBC 0.4 (H) 0.0 - 0.2 %   Neutrophils Relative % 70 %   Neutro Abs 3.2 1.7 - 7.7 K/uL   Lymphocytes Relative 21 %   Lymphs Abs 1.0 0.7 - 4.0 K/uL   Monocytes Relative 7 %   Monocytes Absolute 0.3 0.1 - 1.0 K/uL   Eosinophils Relative 0 %   Eosinophils Absolute 0.0 0.0 - 0.5 K/uL   Basophils Relative 1 %   Basophils Absolute 0.0 0.0 - 0.1 K/uL   Immature Granulocytes 1 %   Abs Immature Granulocytes 0.03 0.00 - 0.07 K/uL    Comment: Performed at [facility], [address] [place], [place] [address]  Magnesium     Status: None   Collection Time: 09/20/21  8:08 AM  Result Value Ref Range   Magnesium 1.9 1.7 - 2.4 mg/dL    Comment: Performed at [facility], [address] [place], [place] [address]  CBC with Differential      Status: Abnormal   Collection Time: 09/27/21  9:57 AM  Result Value Ref Range   WBC 3.4 (L) 4.0 - 10.5 K/uL   RBC 3.32 (L) 3.87 - 5.11 MIL/uL   Hemoglobin 10.2 (L) 12.0 - 15.0 g/dL   HCT 33.0 (L) 36.0 - 46.0 %   MCV 99.4 80.0 - 100.0 fL   MCH 30.7 26.0 - 34.0 pg   MCHC 30.9 30.0 - 36.0 g/dL   RDW 17.0 (H) 11.5 - 15.5 %   Platelets 236 150 - 400 K/uL   nRBC 0.0 0.0 - 0.2 %   Neutrophils Relative % 51 %   Neutro Abs 1.8 1.7 - 7.7 K/uL   Lymphocytes Relative 26 %   Lymphs Abs 0.9 0.7 - 4.0 K/uL   Monocytes Relative 19 %   Monocytes Absolute 0.7 0.1 - 1.0 K/uL   Eosinophils Relative 2 %   Eosinophils Absolute 0.1 0.0 - 0.5 K/uL   Basophils Relative 1 %   Basophils Absolute 0.0 0.0 - 0.1 K/uL   Immature Granulocytes 1 %   Abs Immature Granulocytes 0.04 0.00 - 0.07 K/uL    Comment: Performed at [facility], [address] [place], [place] [address]  Comprehensive metabolic panel     Status: Abnormal   Collection Time: 09/27/21  9:57 AM  Result Value Ref Range   Sodium 136 135 - 145 mmol/L   Potassium 5.4 (H) 3.5 - 5.1 mmol/L   Chloride 113 (H) 98 - 111 mmol/L   CO2 15 (L) 22 - 32 mmol/L   Glucose, Bld 115 (H) 70 - 99 mg/dL    Comment: Glucose reference range applies only to samples taken after fasting for at least 8 hours.   BUN 33 (H) 8 - 23 mg/dL   Creatinine, Ser 1.33 (H) 0.44 - 1.00 mg/dL   Calcium 9.1 8.9 - 10.3 mg/dL   Total Protein 6.3 (L) 6.5 - 8.1 g/dL   Albumin 3.7 3.5 - 5.0 g/dL   AST 15 15 - 41 U/L   ALT 13 0 - 44 U/L   Alkaline Phosphatase 48 38 - 126 U/L   Total Bilirubin 0.6 0.3 - 1.2 mg/dL   GFR, Estimated 43 (L) >60 mL/min    Comment: (NOTE) Calculated using the CKD-EPI Creatinine Equation (2021)    Anion gap 8 5 - 15    Comment: Performed at [facility], [address] [place], [place] [address]  Magnesium     Status: None   Collection Time: 09/27/21  9:57 AM  Result Value Ref Range   Magnesium 1.9 1.7 - 2.4 mg/dL    Comment:  Performed at [facility], [address] [place], [place] [address]  Urinalysis, Complete w Microscopic     Status: Abnormal   Collection Time: 09/27/21  1:27 PM  Result Value Ref Range   Color, Urine YELLOW (A) YELLOW   APPearance CLOUDY (A) CLEAR   Specific Gravity, Urine 1.010 1.005 - 1.030   pH 5.0 5.0 - 8.0   Glucose, UA >=500 (A) NEGATIVE mg/dL   Hgb urine dipstick LARGE (A) NEGATIVE   Bilirubin Urine NEGATIVE NEGATIVE  Ketones, ur 5 (A) NEGATIVE mg/dL   Protein, ur 100 (A) NEGATIVE mg/dL   Nitrite NEGATIVE NEGATIVE   Leukocytes,Ua LARGE (A) NEGATIVE   RBC / HPF >50 (H) 0 - 5 RBC/hpf   WBC, UA >50 (H) 0 - 5 WBC/hpf   Bacteria, UA NONE SEEN NONE SEEN   Squamous Epithelial / LPF 0-5 0 - 5   Mucus PRESENT     Comment: Performed at [facility], [address], [place], [place] [address]  Urine Culture     Status: None   Collection Time: 09/27/21  1:27 PM   Specimen: Urine, Clean Catch  Result Value Ref Range   Specimen Description      URINE, CLEAN CATCH Performed at [facility], [address] [place], [place] [address]    Special Requests      NONE Performed at [facility], [address], [place], [place] [address]    Culture      NO GROWTH Performed at [facility], [address], [place], [place] [address]    Report Status 09/28/2021 FINAL   Basic metabolic panel     Status: Abnormal   Collection Time: 09/30/21  9:24 AM  Result Value Ref Range   Sodium 137 135 - 145 mmol/L   Potassium 4.2 3.5 - 5.1 mmol/L   Chloride 114 (H) 98 - 111 mmol/L   CO2 15 (L) 22 - 32 mmol/L   Glucose, Bld 115 (H) 70 - 99 mg/dL    Comment: Glucose reference range applies only to samples taken after fasting for at least 8 hours.   BUN 14 8 - 23 mg/dL   Creatinine, Ser 1.08 (H) 0.44 - 1.00 mg/dL   Calcium 8.6 (L) 8.9 - 10.3 mg/dL   GFR, Estimated 55 (L) >60 mL/min    Comment: (NOTE) Calculated using the CKD-EPI Creatinine Equation (2021)     Anion gap 8 5 - 15    Comment: Performed at [facility], [address] [address] [place], [place] [address]  Comprehensive metabolic panel     Status: Abnormal   Collection Time: 10/03/21  1:50 PM  Result Value Ref Range   Sodium 139 135 - 145 mmol/L   Potassium 3.6 3.5 - 5.1 mmol/L   Chloride 115 (H) 98 - 111 mmol/L   CO2 16 (L) 22 - 32 mmol/L   Glucose, Bld 107 (H) 70 - 99 mg/dL    Comment: Glucose reference range applies only to samples taken after fasting for at least 8 hours.   BUN 15 8 - 23 mg/dL   Creatinine, Ser 0.93 0.44 - 1.00 mg/dL   Calcium 8.8 (L) 8.9 - 10.3 mg/dL   Total Protein 5.9 (L) 6.5 - 8.1 g/dL   Albumin 3.4 (L) 3.5 - 5.0 g/dL   AST 16 15 - 41 U/L   ALT 15 0 - 44 U/L   Alkaline Phosphatase 47 38 - 126 U/L   Total Bilirubin 0.5 0.3 - 1.2 mg/dL   GFR, Estimated >60 >60 mL/min    Comment: (NOTE) Calculated using the CKD-EPI Creatinine Equation (2021)    Anion gap 8 5 - 15    Comment: Performed at [facility], [address] [place], [place] [address]  CBC with Differential     Status: Abnormal   Collection Time: 10/03/21  1:50 PM  Result Value Ref Range   WBC 5.2 4.0 - 10.5 K/uL   RBC 3.10 (L) 3.87 - 5.11 MIL/uL  Hemoglobin 9.6 (L) 12.0 - 15.0 g/dL   HCT 30.5 (L) 36.0 - 46.0 %   MCV 98.4 80.0 - 100.0 fL   MCH 31.0 26.0 - 34.0 pg   MCHC 31.5 30.0 - 36.0 g/dL   RDW 17.2 (H) 11.5 - 15.5 %   Platelets 153 150 - 400 K/uL   nRBC 0.4 (H) 0.0 - 0.2 %   Neutrophils Relative % 58 %   Neutro Abs 3.0 1.7 - 7.7 K/uL   Lymphocytes Relative 23 %   Lymphs Abs 1.2 0.7 - 4.0 K/uL   Monocytes Relative 16 %   Monocytes Absolute 0.8 0.1 - 1.0 K/uL   Eosinophils Relative 1 %   Eosinophils Absolute 0.1 0.0 - 0.5 K/uL   Basophils Relative 1 %   Basophils Absolute 0.0 0.0 - 0.1 K/uL   Immature Granulocytes 1 %   Abs Immature Granulocytes 0.06 0.00 - 0.07 K/uL    Comment: Performed at [facility], [address] [place], [place] [address]  Magnesium      Status: Abnormal   Collection Time: 10/03/21  1:50 PM  Result Value Ref Range   Magnesium 1.6 (L) 1.7 - 2.4 mg/dL    Comment: Performed at [facility], [address] [place], [place] [address]   Objective  Body mass index is 24.45 kg/m. Wt Readings from Last 3 Encounters:  10/09/21 129 lb 6.4 oz (58.7 kg)  10/03/21 127 lb (57.6 kg)  09/27/21 124 lb 4.8 oz (56.4 kg)   Temp Readings from Last 3 Encounters:  10/09/21 (!) 97.1 F (36.2 C) (Temporal)  10/03/21 99 F (37.2 C) (Tympanic)  09/30/21 97.7 F (36.5 C) (Tympanic)   BP Readings from Last 3 Encounters:  10/09/21 (!) 146/78  10/03/21 (!) 110/55  09/30/21 (!) 106/58   Pulse Readings from Last 3 Encounters:  10/09/21 78  10/03/21 80  09/30/21 78    Physical Exam Vitals and nursing note reviewed.  Constitutional:      Appearance: Normal appearance. She is well-developed and well-groomed.  HENT:     Head: Normocephalic and atraumatic.  Eyes:     Conjunctiva/sclera: Conjunctivae normal.     Pupils: Pupils are equal, round, and reactive to light.  Cardiovascular:     Rate and Rhythm: Normal rate and regular rhythm.     Heart sounds: Normal heart sounds. No murmur heard. Pulmonary:     Effort: Pulmonary effort is normal.     Breath sounds: Normal breath sounds.  Abdominal:     General: Abdomen is flat. Bowel sounds are normal.     Tenderness: There is no abdominal tenderness.  Musculoskeletal:        General: No tenderness.  Skin:    General: Skin is warm and dry.  Neurological:     General: No focal deficit present.     Mental Status: She is alert and oriented to person, place, and time. Mental status is at baseline.     Cranial Nerves: Cranial nerves 2-12 are intact.     Gait: Gait is intact.     Comments: Bl walks with cane  Psychiatric:        Attention and Perception: Attention and perception normal.        Mood and Affect: Mood and affect normal.        Speech: Speech normal.         Behavior: Behavior normal. Behavior is cooperative.        Thought Content: Thought content  normal.        Cognition and Memory: Cognition and memory normal.        Judgment: Judgment normal.    Assessment  Plan  Annual physical exam Flu shot declines 2019 had  Tdap disc in future for prev declines all vaccines as of 11/30/19   shingrix  prevnar utd pna 23 due in 1 year 09/2021 covid vx had 4/4   Mammogram abnormal 2021 + breast cancer getting chemo Pap out of age window no h/o abnormal  Colonoscopy remotely ? In 1980s per pt Record consider in future vs cologuard  -as of 04/05/21 declines cologuard and colonoscopy will think about this No FH colon cancer      rec D3 4000 IU daily + MVT    Dr. Melrose Nakayama 03/2020 neurology call to reschedule   Hypertension elevated today no medsassociated with diabetes  - Plan: Lipid panel, Hemoglobin A1c htn on norvasc 10 mg qd, coreg 25 mg bid, jardiance 25 mg qd and lis 40 and metfomin xr 1000 mg qd, liptor 80  Hematuria if still + neg urine cx 09/2019  Will do CT ab/pelvis   Vitamin D deficiency - Plan: Vitamin D (25 hydroxy)  Cerebrovascular accident (CVA), unspecified mechanism ([place]) - Plan: atorvastatin (LIPITOR) 80 MG tablet, clopidogrel (PLAVIX) 75 MG tablet,          Provider: Dr. Olivia Mackie McLean-Scocuzza-Internal Medicine

## 2021-10-09 NOTE — Telephone Encounter (Signed)
Contacted patient to review lab results and provide recommendations. Left voicemail for her to return our call at her convenience.

## 2021-10-09 NOTE — Patient Instructions (Signed)
Chemotherapy Chemotherapy is a cancer treatment. It uses medicines to slow down or stop the growth of cancer. You may have chemotherapy to: Cure your cancer. Prevent the cancer from growing or spreading (metastasizing). Ease symptoms and improve your quality of life (palliative care). Improve the effects of radiation treatment. Shrink a tumor before surgery. Rid the body of cancer cells that remain after having a tumor surgically removed. The length of chemotherapy treatment depends on many factors, including: The type and stage of your cancer. How you respond to the chemotherapy. Your side effects. What are the risks? Generally, this is a safe treatment. However, problems may occur, including: Infection. Bleeding at the IV site. Allergic reactions to medicines. You may have side effects from chemotherapy. What side effects you have depends on a variety of factors, including: The type of chemotherapy medicine used. Your dosage. How long the medicine is used for. Your overall health. What happens before treatment? You will meet with your cancer care team to discuss: How your chemotherapy medicine will be given. Common side effects and how to manage them. Your treatment schedule. You may have blood tests. You may be given medicine to help prevent common side effects. What happens during treatment? Chemotherapy may be given continuously over time, or it may be given in cycles. Some common ways chemotherapy may be given include: As a pill or capsule. As an injection. As a skin (topical) cream. As a special wafer that is put in your body where the cancer is. As an injection into the cerebrospinal fluid (CSF) in the brain or spinal cord (intraventricular or intrathecal chemotherapy). Through a small, thin tube (catheter). There are different kinds of catheters. You might have one that: Goes into a vein (intravenous catheter). Connects to a device (port) that is inserted under the skin  of your chest (port catheter). A port catheter connects the port to a large vein in your chest or upper arm. The port may stay in place for many weeks or months. Goes into a vein near your elbow (PICC line). This may be used for weeks or months. Goes into a vein in your neck that leads to your heart (non-tunneled catheter). This catheter has a risk of infection, so it is used for only a short time. Goes through the skin of your chest and into a large vein that leads to your heart (tunneled catheter). This catheter can stay in your body for months or years. While you are receiving your medicine, your cancer care team will monitor your blood pressure, heart rate, breathing rate, and blood oxygen level (vital signs) and watch for any problems. Some types of chemotherapy medicine are given only one time. Others are given for months, years, or for life. What can I expect after treatment? After chemotherapy, you may have side effects, such as: Nausea and vomiting. Appetite loss. Constipation or diarrhea. Fatigue. Increased risk of infections, bruising, or bleeding. Hair loss. Mouth or throat sores. Tingling, pain, or numbness in the hands and feet. Dry, sensitive, itchy, or sore skin. Memory changes. Follow these instructions at home: General instructions  If you get chemotherapy through an IV, PICC line, or port, check the site every day for signs of infection. Check for redness, swelling, pain, fluid, or warmth. Wash your hands frequently with soap and water. If soap and water are not available, use hand sanitizer. Have other members of your household wash their hands often. Chemotherapy medicines leave the body through urine or stool (feces), but they can  also be present in other body fluids including vomit, tears, vaginal fluids, and semen. You must carefully follow some safety precautions to prevent harm to others while you are taking these medicines: Wash any clothes, towels, and linens that  may have your bodily fluids on them twice in a washing machine. Use a condom during vaginal, anal, and oral sex while you are taking chemotherapy medicines and for 48 hours after your last dose. Practice good bathroom hygiene: Always sit when using the toilet. Close the toilet seat lid before you flush. Wash your hands thoroughly with soap and water after each time you use the toilet. Keep all follow-up visits as told by your cancer care team. This is important. Eating and drinking Talk with a dietitian about what you should eat and drink during cancer treatment. Always wash fresh fruits and vegetables well before eating them. Drink enough fluid to keep urine pale yellow. Medicines Take over-the-counter and prescription medicines only as told by your health care provider. Talk with your health care provider before taking vitamins and supplements. Some can interfere with chemotherapy. Activity Get plenty of rest. Get regular exercise such as walking, gentle yoga, or tai chi. Return to your normal activities as told by your health care provider. Ask your health care provider what activities are safe for you. Contact a health care provider if: You have: A skin rash that does not go away. A headache. A stiff neck. A cough. Cold or flu symptoms. A burning feeling when urinating. Urine that smells bad. Diarrhea. Nausea. Vomiting. Blood in your urine or stool. You bleed or bruise often. You urinate more frequently than usual. You cannot eat because of mouth or throat pain. Get help right away if: You have: A fever. Redness, swelling, pain, fluid, or warmth near your IV site. Bleeding that does not stop. A seizure. Chest pain. Difficulty breathing. You cannot swallow. Summary Chemotherapy is a way to treat cancer. It uses medicines to slow down or stop the growth of cancer. Before treatment, you and your cancer care team will discuss common side effects and how to manage  them. The way that you will get chemotherapy medicines depends on your condition. Take over-the-counter and prescription medicines only as told by your health care provider. This information is not intended to replace advice given to you by your health care provider. Make sure you discuss any questions you have with your health care provider. Document Revised: 04/18/2020 Document Reviewed: 06/01/2020 Elsevier Patient Education  [place].

## 2021-10-10 ENCOUNTER — Other Ambulatory Visit: Payer: Self-pay | Admitting: Internal Medicine

## 2021-10-10 ENCOUNTER — Telehealth: Payer: Self-pay

## 2021-10-10 DIAGNOSIS — R319 Hematuria, unspecified: Secondary | ICD-10-CM | POA: Diagnosis not present

## 2021-10-10 DIAGNOSIS — E1165 Type 2 diabetes mellitus with hyperglycemia: Secondary | ICD-10-CM

## 2021-10-10 LAB — URINALYSIS, ROUTINE W REFLEX MICROSCOPIC
Bilirubin Urine: NEGATIVE
Nitrite: NEGATIVE
Specific Gravity, Urine: 1.02 (ref 1.001–1.035)
WBC, UA: >=60 /HPF — AB (ref 0–5)
pH: 6 (ref 5.0–8.0)

## 2021-10-10 LAB — MICROSCOPIC MESSAGE

## 2021-10-10 NOTE — Telephone Encounter (Signed)
LMTCB in regards to labs.

## 2021-10-10 NOTE — Telephone Encounter (Signed)
See separate CMA phone note trail

## 2021-10-10 NOTE — Addendum Note (Signed)
Addended by: Orland Mustard on: 10/10/2021 01:07 PM   Modules accepted: Orders

## 2021-10-11 ENCOUNTER — Other Ambulatory Visit: Payer: Self-pay

## 2021-10-11 LAB — URINE CULTURE
MICRO NUMBER:: 12651731
SPECIMEN QUALITY:: ADEQUATE

## 2021-10-23 ENCOUNTER — Telehealth: Payer: Self-pay | Admitting: Internal Medicine

## 2021-10-23 NOTE — Telephone Encounter (Signed)
Tammy Nunez, CMA  10/23/2021  3:53 PM EST Back to Top    Left message to return call.   Letter sent to call in for results.    Tammy Nunez, [facility]  10/16/2021  3:14 PM EST     LMTCB   Tammy Nunez, [facility]  10/14/2021  4:14 PM EST     Attempted to call no answer no voicemail.    Tammy Glow McLean-Scocuzza, MD  10/14/2021  8:12 AM EST     No uti  Is pt agreeable to CT ab/pelvis to work up blood in urine?

## 2021-10-25 ENCOUNTER — Inpatient Hospital Stay: Payer: PPO

## 2021-10-25 ENCOUNTER — Encounter: Payer: Self-pay | Admitting: Oncology

## 2021-10-25 ENCOUNTER — Inpatient Hospital Stay: Payer: PPO | Attending: Oncology

## 2021-10-25 ENCOUNTER — Other Ambulatory Visit: Payer: Self-pay

## 2021-10-25 ENCOUNTER — Inpatient Hospital Stay (HOSPITAL_BASED_OUTPATIENT_CLINIC_OR_DEPARTMENT_OTHER): Payer: PPO | Admitting: Oncology

## 2021-10-25 VITALS — BP 127/96 | HR 85 | Temp 98.6°F | Resp 18 | Wt 137.7 lb

## 2021-10-25 DIAGNOSIS — D6481 Anemia due to antineoplastic chemotherapy: Secondary | ICD-10-CM

## 2021-10-25 DIAGNOSIS — Z5111 Encounter for antineoplastic chemotherapy: Secondary | ICD-10-CM | POA: Insufficient documentation

## 2021-10-25 DIAGNOSIS — Z79899 Other long term (current) drug therapy: Secondary | ICD-10-CM | POA: Diagnosis not present

## 2021-10-25 DIAGNOSIS — E119 Type 2 diabetes mellitus without complications: Secondary | ICD-10-CM | POA: Insufficient documentation

## 2021-10-25 DIAGNOSIS — Z170 Estrogen receptor positive status [ER+]: Secondary | ICD-10-CM

## 2021-10-25 DIAGNOSIS — Z7982 Long term (current) use of aspirin: Secondary | ICD-10-CM | POA: Diagnosis not present

## 2021-10-25 DIAGNOSIS — C50912 Malignant neoplasm of unspecified site of left female breast: Secondary | ICD-10-CM

## 2021-10-25 DIAGNOSIS — R634 Abnormal weight loss: Secondary | ICD-10-CM | POA: Insufficient documentation

## 2021-10-25 DIAGNOSIS — T451X5A Adverse effect of antineoplastic and immunosuppressive drugs, initial encounter: Secondary | ICD-10-CM | POA: Diagnosis not present

## 2021-10-25 DIAGNOSIS — Z87891 Personal history of nicotine dependence: Secondary | ICD-10-CM | POA: Diagnosis not present

## 2021-10-25 DIAGNOSIS — I2510 Atherosclerotic heart disease of native coronary artery without angina pectoris: Secondary | ICD-10-CM | POA: Diagnosis not present

## 2021-10-25 DIAGNOSIS — I10 Essential (primary) hypertension: Secondary | ICD-10-CM | POA: Insufficient documentation

## 2021-10-25 DIAGNOSIS — C50812 Malignant neoplasm of overlapping sites of left female breast: Secondary | ICD-10-CM | POA: Insufficient documentation

## 2021-10-25 DIAGNOSIS — E876 Hypokalemia: Secondary | ICD-10-CM

## 2021-10-25 DIAGNOSIS — Z8673 Personal history of transient ischemic attack (TIA), and cerebral infarction without residual deficits: Secondary | ICD-10-CM | POA: Insufficient documentation

## 2021-10-25 LAB — CBC WITH DIFFERENTIAL/PLATELET
Abs Immature Granulocytes: 0.02 10*3/uL (ref 0.00–0.07)
Basophils Absolute: 0 10*3/uL (ref 0.0–0.1)
Basophils Relative: 1 %
Eosinophils Absolute: 0.1 10*3/uL (ref 0.0–0.5)
Eosinophils Relative: 2 %
HCT: 35.1 % — ABNORMAL LOW (ref 36.0–46.0)
Hemoglobin: 10.9 g/dL — ABNORMAL LOW (ref 12.0–15.0)
Immature Granulocytes: 0 %
Lymphocytes Relative: 24 %
Lymphs Abs: 1.1 10*3/uL (ref 0.7–4.0)
MCH: 30.5 pg (ref 26.0–34.0)
MCHC: 31.1 g/dL (ref 30.0–36.0)
MCV: 98.3 fL (ref 80.0–100.0)
Monocytes Absolute: 0.5 10*3/uL (ref 0.1–1.0)
Monocytes Relative: 11 %
Neutro Abs: 2.8 10*3/uL (ref 1.7–7.7)
Neutrophils Relative %: 62 %
Platelets: 213 10*3/uL (ref 150–400)
RBC: 3.57 MIL/uL — ABNORMAL LOW (ref 3.87–5.11)
RDW: 15.1 % (ref 11.5–15.5)
WBC: 4.6 10*3/uL (ref 4.0–10.5)
nRBC: 0 % (ref 0.0–0.2)

## 2021-10-25 LAB — COMPREHENSIVE METABOLIC PANEL
ALT: 21 U/L (ref 0–44)
AST: 26 U/L (ref 15–41)
Albumin: 3.6 g/dL (ref 3.5–5.0)
Alkaline Phosphatase: 61 U/L (ref 38–126)
Anion gap: 10 (ref 5–15)
BUN: 10 mg/dL (ref 8–23)
CO2: 25 mmol/L (ref 22–32)
Calcium: 8.7 mg/dL — ABNORMAL LOW (ref 8.9–10.3)
Chloride: 106 mmol/L (ref 98–111)
Creatinine, Ser: 0.65 mg/dL (ref 0.44–1.00)
GFR, Estimated: >60 mL/min (ref >60)
Glucose, Bld: 125 mg/dL — ABNORMAL HIGH (ref 70–99)
Potassium: 2.7 mmol/L — CL (ref 3.5–5.1)
Sodium: 141 mmol/L (ref 135–145)
Total Bilirubin: 0.3 mg/dL (ref 0.3–1.2)
Total Protein: 6.3 g/dL — ABNORMAL LOW (ref 6.5–8.1)

## 2021-10-25 LAB — MAGNESIUM: Magnesium: 2 mg/dL (ref 1.7–2.4)

## 2021-10-25 MED ORDER — HEPARIN SOD (PORK) LOCK FLUSH 100 UNIT/ML IV SOLN
INTRAVENOUS | Status: AC
  Administered 2021-10-25: 500 [IU]
  Filled 2021-10-25: qty 5

## 2021-10-25 MED ORDER — DIPHENHYDRAMINE HCL 50 MG/ML IJ SOLN
50.0000 mg | Freq: Once | INTRAMUSCULAR | Status: AC
  Administered 2021-10-25: 50 mg via INTRAVENOUS
  Filled 2021-10-25: qty 1

## 2021-10-25 MED ORDER — SODIUM CHLORIDE 0.9 % IV SOLN
75.0000 mg/m2 | Freq: Once | INTRAVENOUS | Status: AC
  Administered 2021-10-25: 120 mg via INTRAVENOUS
  Filled 2021-10-25: qty 20

## 2021-10-25 MED ORDER — POTASSIUM CHLORIDE 20 MEQ/100ML IV SOLN
20.0000 meq | Freq: Once | INTRAVENOUS | Status: AC
  Administered 2021-10-25: 20 meq via INTRAVENOUS

## 2021-10-25 MED ORDER — SODIUM CHLORIDE 0.9 % IV SOLN
Freq: Once | INTRAVENOUS | Status: AC
Start: 2021-10-25 — End: 2021-10-25
  Filled 2021-10-25: qty 250

## 2021-10-25 MED ORDER — SODIUM CHLORIDE 0.9% FLUSH
10.0000 mL | INTRAVENOUS | Status: DC | PRN
  Filled 2021-10-25: qty 10

## 2021-10-25 MED ORDER — SODIUM CHLORIDE 0.9 % IV SOLN
10.0000 mg | Freq: Once | INTRAVENOUS | Status: AC
  Administered 2021-10-25: 10 mg via INTRAVENOUS
  Filled 2021-10-25: qty 10

## 2021-10-25 MED ORDER — HEPARIN SOD (PORK) LOCK FLUSH 100 UNIT/ML IV SOLN
500.0000 [IU] | Freq: Once | INTRAVENOUS | Status: AC | PRN
  Filled 2021-10-25: qty 5

## 2021-10-25 MED ORDER — POTASSIUM CHLORIDE CRYS ER 10 MEQ PO TBCR
20.0000 meq | EXTENDED_RELEASE_TABLET | Freq: Once | ORAL | Status: AC
  Administered 2021-10-25: 20 meq via ORAL
  Filled 2021-10-25: qty 2

## 2021-10-25 MED ORDER — FAMOTIDINE 20 MG IN NS 100 ML IVPB
20.0000 mg | Freq: Once | INTRAVENOUS | Status: AC
  Administered 2021-10-25: 20 mg via INTRAVENOUS
  Filled 2021-10-25: qty 100
  Filled 2021-10-25: qty 20

## 2021-10-25 NOTE — Progress Notes (Signed)
Pt here for follow up. No new concerns voiced.   

## 2021-10-25 NOTE — Patient Instructions (Signed)
Increase your 31meq potassium supplement to twice daily until your next appointment with Dr. Tasia Catchings on 11/01/21.  [facility]  Discharge Instructions: Thank you for choosing [facility] to provide your oncology and hematology care.  If you have a lab appointment with the [facility], please go directly to the [facility] and check in at the registration area.  Wear comfortable clothing and clothing appropriate for easy access to any Portacath or PICC line.   We strive to give you quality time with your provider. You may need to reschedule your appointment if you arrive late (15 or more minutes).  Arriving late affects you and other patients whose appointments are after yours.  Also, if you miss three or more appointments without notifying the office, you may be dismissed from the clinic at the provider's discretion.      For prescription refill requests, have your pharmacy contact our office and allow 72 hours for refills to be completed.    Today you received the following chemotherapy and/or immunotherapy agents - taxol      To help prevent nausea and vomiting after your treatment, we encourage you to take your nausea medication as directed.  BELOW ARE SYMPTOMS THAT SHOULD BE REPORTED IMMEDIATELY: *FEVER GREATER THAN 100.4 F (38 C) OR HIGHER *CHILLS OR SWEATING *NAUSEA AND VOMITING THAT IS NOT CONTROLLED WITH YOUR NAUSEA MEDICATION *UNUSUAL SHORTNESS OF BREATH *UNUSUAL BRUISING OR BLEEDING *URINARY PROBLEMS (pain or burning when urinating, or frequent urination) *BOWEL PROBLEMS (unusual diarrhea, constipation, pain near the anus) TENDERNESS IN MOUTH AND THROAT WITH OR WITHOUT PRESENCE OF ULCERS (sore throat, sores in mouth, or a toothache) UNUSUAL RASH, SWELLING OR PAIN  UNUSUAL VAGINAL DISCHARGE OR ITCHING   Items with * indicate a potential emergency and should be followed up as soon as possible or go to the Emergency Department if any  problems should occur.  Please show the CHEMOTHERAPY ALERT CARD or IMMUNOTHERAPY ALERT CARD at check-in to the Emergency Department and triage nurse.  Should you have questions after your visit or need to cancel or reschedule your appointment, please contact [facility] [place] [facility] [place]  [phone] and follow the prompts.  Office hours are 8:00 a.m. to 4:30 p.m. Monday - Friday. Please note that voicemails left after 4:00 p.m. may not be returned until the following business day.  We are closed weekends and major holidays. You have access to a nurse at all times for urgent questions. Please call the main number to the clinic [phone] and follow the prompts.  For any non-urgent questions, you may also contact your provider using MyChart. We now offer e-Visits for anyone 6 and older to request care online for non-urgent symptoms. For details visit [email or web address].   Also download the MyChart app! Go to the app store, search "MyChart", open the app, select [facility], and log in with your MyChart username and password.  Due to Covid, a mask is required upon entering the hospital/clinic. If you do not have a mask, one will be given to you upon arrival. For doctor visits, patients may have 1 support person aged 98 or older with them. For treatment visits, patients cannot have anyone with them due to current Covid guidelines and our immunocompromised population.   Paclitaxel injection What is this medication? PACLITAXEL (PAK li TAX el) is a chemotherapy drug. It targets fast dividing cells, like cancer cells, and causes these cells to die. This medicine is used to treat ovarian  cancer, breast cancer, lung cancer, Kaposi's sarcoma, and other cancers. This medicine may be used for other purposes; ask your health care provider or pharmacist if you have questions. COMMON BRAND NAME(S): Onxol, Taxol What should I tell my care team before I take this medication? They need to  know if you have any of these conditions: history of irregular heartbeat liver disease low blood counts, like low white cell, platelet, or red cell counts lung or breathing disease, like asthma tingling of the fingers or toes, or other nerve disorder an unusual or allergic reaction to paclitaxel, alcohol, polyoxyethylated castor oil, other chemotherapy, other medicines, foods, dyes, or preservatives pregnant or trying to get pregnant breast-feeding How should I use this medication? This drug is given as an infusion into a vein. It is administered in a hospital or clinic by a specially trained health care professional. Talk to your pediatrician regarding the use of this medicine in children. Special care may be needed. Overdosage: If you think you have taken too much of this medicine contact a poison control center or emergency room at once. NOTE: This medicine is only for you. Do not share this medicine with others. What if I miss a dose? It is important not to miss your dose. Call your doctor or health care professional if you are unable to keep an appointment. What may interact with this medication? Do not take this medicine with any of the following medications: live virus vaccines This medicine may also interact with the following medications: antiviral medicines for hepatitis, HIV or AIDS certain antibiotics like erythromycin and clarithromycin certain medicines for fungal infections like ketoconazole and itraconazole certain medicines for seizures like carbamazepine, phenobarbital, phenytoin gemfibrozil nefazodone rifampin [place] John's wort This list may not describe all possible interactions. Give your health care provider a list of all the medicines, herbs, non-prescription drugs, or dietary supplements you use. Also tell them if you smoke, drink alcohol, or use illegal drugs. Some items may interact with your medicine. What should I watch for while using this medication? Your  condition will be monitored carefully while you are receiving this medicine. You will need important blood work done while you are taking this medicine. This medicine can cause serious allergic reactions. To reduce your risk you will need to take other medicine(s) before treatment with this medicine. If you experience allergic reactions like skin rash, itching or hives, swelling of the face, lips, or tongue, tell your doctor or health care professional right away. In some cases, you may be given additional medicines to help with side effects. Follow all directions for their use. This drug may make you feel generally unwell. This is not uncommon, as chemotherapy can affect healthy cells as well as cancer cells. Report any side effects. Continue your course of treatment even though you feel ill unless your doctor tells you to stop. Call your doctor or health care professional for advice if you get a fever, chills or sore throat, or other symptoms of a cold or flu. Do not treat yourself. This drug decreases your body's ability to fight infections. Try to avoid being around people who are sick. This medicine may increase your risk to bruise or bleed. Call your doctor or health care professional if you notice any unusual bleeding. Be careful brushing and flossing your teeth or using a toothpick because you may get an infection or bleed more easily. If you have any dental work done, tell your dentist you are receiving this medicine. Avoid taking  products that contain aspirin, acetaminophen, ibuprofen, naproxen, or ketoprofen unless instructed by your doctor. These medicines may hide a fever. Do not become pregnant while taking this medicine. Women should inform their doctor if they wish to become pregnant or think they might be pregnant. There is a potential for serious side effects to an unborn child. Talk to your health care professional or pharmacist for more information. Do not breast-feed an infant while  taking this medicine. Men are advised not to father a child while receiving this medicine. This product may contain alcohol. Ask your pharmacist or healthcare provider if this medicine contains alcohol. Be sure to tell all healthcare providers you are taking this medicine. Certain medicines, like metronidazole and disulfiram, can cause an unpleasant reaction when taken with alcohol. The reaction includes flushing, headache, nausea, vomiting, sweating, and increased thirst. The reaction can last from 30 minutes to several hours. What side effects may I notice from receiving this medication? Side effects that you should report to your doctor or health care professional as soon as possible: allergic reactions like skin rash, itching or hives, swelling of the face, lips, or tongue breathing problems changes in vision fast, irregular heartbeat high or low blood pressure mouth sores pain, tingling, numbness in the hands or feet signs of decreased platelets or bleeding - bruising, pinpoint red spots on the skin, black, tarry stools, blood in the urine signs of decreased red blood cells - unusually weak or tired, feeling faint or lightheaded, falls signs of infection - fever or chills, cough, sore throat, pain or difficulty passing urine signs and symptoms of liver injury like dark yellow or brown urine; general ill feeling or flu-like symptoms; light-colored stools; loss of appetite; nausea; right upper belly pain; unusually weak or tired; yellowing of the eyes or skin swelling of the ankles, feet, hands unusually slow heartbeat Side effects that usually do not require medical attention (report to your doctor or health care professional if they continue or are bothersome): diarrhea hair loss loss of appetite muscle or joint pain nausea, vomiting pain, redness, or irritation at site where injected tiredness This list may not describe all possible side effects. Call your doctor for medical advice  about side effects. You may report side effects to FDA at 1-800-FDA-1088. Where should I keep my medication? This drug is given in a hospital or clinic and will not be stored at home. NOTE: This sheet is a summary. It may not cover all possible information. If you have questions about this medicine, talk to your doctor, pharmacist, or health care provider.  2022 Elsevier/Gold Standard (2021-07-30 00:00:00)

## 2021-10-25 NOTE — Progress Notes (Signed)
OK to treat per Dr. Tasia Catchings. Adding 50meq KCL IV and 61meq KCL PO. Per Dr. Collie Siad instructions, patient to increase her potassium supplement to 13meq twice daily until her next appointment with Dr. Tasia Catchings on 12/9. She verbalized understanding of the instructions and states she has enough tablets until her next appointment. I advised her to call for refill if she turns out not to have enough.

## 2021-10-25 NOTE — Progress Notes (Signed)
Hematology/Oncology Progress note [phone]    Patient Care Team: McLean-Scocuzza, Nino Glow, MD as PCP - General (Internal Medicine) De Hollingshead, [place] as Pharmacist (Pharmacist) Theodore Demark, RN as Oncology Nurse Navigator  REFERRING PROVIDER: McLean-Scocuzza, Olivia Mackie *  CHIEF COMPLAINTS/REASON FOR VISIT:  multifocal left breast cancer  HISTORY OF PRESENTING ILLNESS:   Tammy Nunez is a  70 y.o.  female with PMH listed below was seen in consultation at the request of  McLean-Scocuzza, Olivia Mackie *  for evaluation of multifocal left breast cancer.   04/24/2021, screening mammogram showed large asymmetry in the superior aspect of the breast with associated calcifications and possible masses and a subtle distortions.  No suspicious findings for malignancy right breast.  05/02/2021 left breast diagnostic mammogram and ultrasound showed multiple irregular mass in the left breast. Mammogram mass 1 irregular mass associated with distortion in the upper breast at middle depth, multiple adjacent and possible continuous irregular masses, conglomeration of masses measures at least 5.6 cm.  Associated pleomorphic calcifications extending at least 5 x 3.9 x 3.7 cm Mass 2, 1 cm mass in the upper breast at the middle to posterior depth. Mass 3, 5 mm irregular mass in the upper breast at posterior depth. In total, mass 1-mass 3 span at least 8.2 cm in anterior-posterior extent.  By ultrasound  mass 1 12:00 5 cm from nipple, 2.7 x 1.5 x 4.3 cm Mass 2, 12:00 12 cm from nipple, 0.9 x 0.9 x 0.5 cm Mass 3   12:00 14 cm from nipple, 0.4 x 0.4 x 0.4 cm-uses 2.4 cm superior to mass to Mass 4   11:00  No suspicious left axillary adenopathy.   05/07/2021 -Ultrasound-guided breast biopsy Breast left 12:00 mass 5 cm from nipple biopsy showed invasive mammary carcinoma, no special type, grade 3, high-grade DCIS present, no LVI, ER 90%, PR 11-50%, HER2 negative by IHC Breast left 11:00  mass 10 cm from nipple biopsy showed invasive mammary carcinoma, no special type, grade 2, no DCIS/LVI identified.ER 90%, PR 11-50%, HER2 negative by IHC  Patient is married has no children.  Denies any hormone replacement, oral contraceptive use, family history of breast cancer.  Denies any chest radiation. Patient has a history of stroke with chronic residual weakness of the left side.  She walks with a cane.  05/22/2021 MRI breast showed that nonmass like malignancy spanning over 8 cm, abuting anterior aspect of pectoralis muscle. Discussed with Dr.Cintron and we agree that she will need mastectomy. There is concern of possible positive margin. I recommend neoadjuvant chemotherapy  # medi port placed on 06/06/2021. 06/13/2021 pre chemotherapy Echo. LVEF 60-65%.  Normal global longitudinal strain. Grade 1 diastolic dysfunction.   # 05/22/2021 MRI breast showed  Biopsy-proven malignancy within the UPPER LEFT breast spanning a distance of 8.3 x 4.2 x 4 cm nonmass like enhancement abuts the anterior aspect of the LEFT pectoralis muscle without gross invasion. Oncotype Dx 27. Discussed with Dr.Cintron, there is concern of positive margin.   #Chemotherapy 06/21/2021 - 08/16/2021 ddAC x4 with G-CSF support. 08/30/2021 - 09/06/2021, weekly Taxol. Chemotherapy break due to side effects, UTI and the patient's preference. 10/25/2021  INTERVAL HISTORY Tammy Nunez is a 70 y.o. female who has above history reviewed by me today presents for follow up visit for chemotherapy of breast cancer Patient was accompanied by her husband. Patient has gained weight.  Appetite has improved.  Feels well today.  Review of Systems  Constitutional:  Positive for fatigue. Negative for appetite  change, chills and fever.  HENT:   Negative for hearing loss and voice change.   Eyes:  Negative for eye problems.  Respiratory:  Negative for chest tightness and cough.   Cardiovascular:  Negative for chest pain.   Gastrointestinal:  Negative for abdominal distention, abdominal pain, blood in stool, diarrhea and nausea.  Endocrine: Negative for hot flashes.  Genitourinary:  Negative for difficulty urinating, dysuria and frequency.   Musculoskeletal:  Negative for arthralgias.  Skin:  Negative for itching and rash.  Neurological:  Negative for extremity weakness.  Hematological:  Negative for adenopathy. Does not bruise/bleed easily.  Psychiatric/Behavioral:  Negative for confusion.    MEDICAL HISTORY:  Past Medical History:  Diagnosis Date   Breast cancer in female [facility]) 05/08/2021   Chronic left shoulder pain    Coronary artery disease    Diabetes mellitus without complication ([place])    Dysrhythmia    Hypertension    Hypomagnesemia 09/13/2021   Paralysis ([place])    weakness left upper amd lower extremity   PONV (postoperative nausea and vomiting)    Stroke [facility])     SURGICAL HISTORY: Past Surgical History:  Procedure Laterality Date   BREAST BIOPSY Left 05/07/2021   12:00 5cmfn vision clip path pending   BREAST BIOPSY Left 05/07/2021   11:00 10 cmfn mini cork clip path pending   CAROTID PTA/STENT INTERVENTION Left 07/20/2019   Procedure: CAROTID PTA/STENT INTERVENTION;  Surgeon: Katha Cabal, MD;  Location: [place] [facility];  Service: Cardiovascular;  Laterality: Left;   ECTOPIC PREGNANCY SURGERY     PORTACATH PLACEMENT N/A 06/06/2021   Procedure: INSERTION PORT-A-CATH;  Surgeon: Herbert Pun, MD;  Location: [facility] ORS;  Service: General;  Laterality: N/A;    SOCIAL HISTORY: Social History   Socioeconomic History   Marital status: Married    Spouse name: Not on file   Number of children: Not on file   Years of education: Not on file   Highest education level: Not on file  Occupational History   Not on file  Tobacco Use   Smoking status: Former    Packs/day: 0.50    Years: 15.00    Pack years: 7.50    Types: Cigarettes    Quit date: 04/11/2019    Years  since quitting: 2.5   Smokeless tobacco: Never  Vaping Use   Vaping Use: Never used  Substance and Sexual Activity   Alcohol use: Never   Drug use: Never   Sexual activity: Not Currently  Other Topics Concern   Not on file  Social History Narrative   No kids    Married with husband    Used to work [employer] broadcast assistant    Social Determinants of Radio broadcast assistant Strain: Low Risk    Difficulty of Paying Living Expenses: Not hard at all  Food Insecurity: Not on file  Transportation Needs: Not on file  Physical Activity: Not on file  Stress: Not on file  Social Connections: Not on file  Intimate Partner Violence: Not on file    FAMILY HISTORY: Family History  Problem Relation Age of Onset   Diabetes type II Mother    Hypertension Father    Heart attack Father    Diabetes type II Brother    Breast cancer Neg Hx     ALLERGIES:  has No Known Allergies.  MEDICATIONS:  Current Outpatient Medications  Medication Sig Dispense Refill   amLODipine (NORVASC) 10 MG tablet Take 1 tablet (10  mg total) by mouth daily. 90 tablet 3   aspirin 81 MG EC tablet Take 1 tablet (81 mg total) by mouth daily. 90 tablet 3   atorvastatin (LIPITOR) 80 MG tablet Take 1 tablet (80 mg total) by mouth daily. 90 tablet 3   blood glucose meter kit and supplies 1 each by Other route 4 (four) times daily. Dispense based on patient and insurance preference. Four times daily as directed. (FOR ICD-10 E10.9, E11.9). 1 each 0   carvedilol (COREG) 25 MG tablet Take 1 tablet (25 mg total) by mouth 2 (two) times daily with a meal. 180 tablet 3   clopidogrel (PLAVIX) 75 MG tablet Take 1 tablet (75 mg total) by mouth daily. Further refills [facility] 90 tablet 3   Continuous Blood Gluc Sensor (FREESTYLE LIBRE 2 SENSOR) MISC USE TO CHECK GLUCOSE AT LEAST EVERY 8 HOURS 6 each 0   empagliflozin (JARDIANCE) 25 MG TABS tablet Take 1 tablet (25 mg total) by mouth daily. 90 tablet 3    lidocaine-prilocaine (EMLA) cream Apply to affected area once 30 g 3   lisinopril (ZESTRIL) 40 MG tablet Take 1 tablet (40 mg total) by mouth daily. 90 tablet 3   loperamide (IMODIUM) 2 MG capsule Take 1 capsule (2 mg total) by mouth See admin instructions. Take 91m at the onset of diarrhea, and then 232mafter each loose bowel movement, maximum 1679mer 24 hours. 30 capsule 1   loratadine (CLARITIN) 10 MG tablet Take 1 tablet (10 mg total) by mouth daily. 90 tablet 3   magnesium chloride (SLOW-MAG) 64 MG TBEC SR tablet Take 1 tablet (64 mg total) by mouth daily. 30 tablet 1   Multiple Vitamin (MULTIVITAMIN) tablet Take 1 tablet by mouth daily.     ondansetron (ZOFRAN) 8 MG tablet Take 1 tablet (8 mg total) by mouth 2 (two) times daily as needed. Start on the third day after chemotherapy. 30 tablet 1   potassium chloride SA (KLOR-CON M20) 20 MEQ tablet Take 1 tablet (20 mEq total) by mouth daily. 90 tablet 3   prochlorperazine (COMPAZINE) 10 MG tablet Take 1 tablet (10 mg total) by mouth every 6 (six) hours as needed (Nausea or vomiting). 30 tablet 1   vitamin B-12 (CYANOCOBALAMIN) 1000 MCG tablet Take 1 tablet (1,000 mcg total) by mouth daily. 30 tablet 1   Vitamin D, Cholecalciferol, 50 MCG (2000 UT) CAPS Take 2,000 Units by mouth.     No current facility-administered medications for this visit.   Facility-Administered Medications Ordered in Other Visits  Medication Dose Route Frequency Provider Last Rate Last Admin   heparin lock flush 100 UNIT/ML injection              PHYSICAL EXAMINATION: ECOG PERFORMANCE STATUS: 1 - Symptomatic but completely ambulatory Vitals:   10/25/21 0957  BP: (!) 127/96  Pulse: 85  Resp: 18  Temp: 98.6 F (37 C)   Filed Weights   10/25/21 0957  Weight: 137 lb 11.2 oz (62.5 kg)    Physical Exam Constitutional:      General: She is not in acute distress. HENT:     Head: Normocephalic and atraumatic.  Eyes:     General: No scleral  icterus. Cardiovascular:     Rate and Rhythm: Normal rate and regular rhythm.     Heart sounds: Normal heart sounds.  Pulmonary:     Effort: Pulmonary effort is normal. No respiratory distress.     Breath sounds: No wheezing.  Abdominal:  General: Bowel sounds are normal. There is no distension.     Palpations: Abdomen is soft.  Musculoskeletal:        General: No deformity. Normal range of motion.     Cervical back: Normal range of motion and neck supple.  Skin:    General: Skin is warm and dry.     Findings: No erythema or rash.  Neurological:     Mental Status: She is alert and oriented to person, place, and time. Mental status is at baseline.     Comments: Chronic left upper and lower extremity weakness.  Psychiatric:        Mood and Affect: Mood normal.      LABORATORY DATA:  I have reviewed the data as listed Lab Results  Component Value Date   WBC 4.6 10/25/2021   HGB 10.9 (L) 10/25/2021   HCT 35.1 (L) 10/25/2021   MCV 98.3 10/25/2021   PLT 213 10/25/2021   Recent Labs    09/27/21 0957 09/30/21 0924 10/03/21 1350 10/25/21 0945  NA 136 137 139 141  K 5.4* 4.2 3.6 2.7*  CL 113* 114* 115* 106  CO2 15* 15* 16* 25  GLUCOSE 115* 115* 107* 125*  BUN 33* _0 CREATININE 1.33* 1.08* 0.93 0.65  CALCIUM 9.1 8.6* 8.8* 8.7*  GFRNONAA 43* 55* >60 >60  PROT 6.3*  --  5.9* 6.3*  ALBUMIN 3.7  --  3.4* 3.6  AST 15  --  16 26  ALT 13  --  15 21  ALKPHOS 48  --  47 61  BILITOT 0.6  --  0.5 0.3    Iron/TIBC/Ferritin/ %Sat    Component Value Date/Time   IRON 113 12/08/2019 1015   TIBC 317 12/08/2019 1015   FERRITIN 67 12/08/2019 1015   IRONPCTSAT 36 12/08/2019 1015       RADIOGRAPHIC STUDIES: I have personally reviewed the radiological images as listed and agreed with the findings in the report. MR BREAST BILATERAL [place] CAD  Result Date: 08/26/2021 CLINICAL DATA:  Evaluate treatment response. Patient has completed 5 weeks of chemotherapy.  Ultrasound biopsy of a conglomerate of masses in the LEFT breast 12 o'clock 5 centimeters from nipple showed invasive mammary carcinoma. Biopsy of LEFT breast 11 o'clock location 10 centimeters from the nipple showed invasive mammary carcinoma. Additional masses in the LEFT breast identified with ultrasound were not biopsied. LABS:  None obtained at the time of imaging. EXAM: BILATERAL BREAST MRI WITH AND WITHOUT CONTRAST TECHNIQUE: Multiplanar, multisequence MR images of both breasts were obtained prior to and following the intravenous administration of 6 ml of Gadavist Three-dimensional MR images were rendered by post-processing of the original MR data on an independent workstation. The three-dimensional MR images were interpreted, and findings are reported in the following complete MRI report for this study. Three dimensional images were evaluated at the independent interpreting workstation using the DynaCAD thin client. COMPARISON:  MRI on 05/22/2021 FINDINGS: Breast composition: c. Heterogeneous fibroglandular tissue. Background parenchymal enhancement: Minimal Right breast: No mass or abnormal enhancement. Interval placement of RIGHT-sided Port-A-Cath. Left breast: Numerous masses and adjacent non mass enhancement in the UPPER LEFT breast spans 9.1 x 3.5 x 3.4 centimeters. Previously, mass measured 8.3 x 4.2 x 4.0 centimeters. Mass shows rapid washout type enhancement kinetics. Non mass enhancement is again noted to extend to the LEFT pectoralis muscle, without pectoralis enhancement. Lymph nodes: No abnormal appearing lymph nodes. Ancillary findings:  None. IMPRESSION: 1. Masses and  non mass enhancement in the UPPER LEFT breast, similar in appearance and size compared to prior study. 2. RIGHT breast remains negative. RECOMMENDATION: Treatment plan for LEFT breast malignancy. BI-RADS CATEGORY  6: Known biopsy-proven malignancy. Electronically Signed   By: Nolon Nations M.D.   On: 08/26/2021 13:49       ASSESSMENT & PLAN:  1. Malignant neoplasm of left breast in female, estrogen receptor positive, unspecified site of breast ([place])   2. Antineoplastic chemotherapy induced anemia   3. Encounter for antineoplastic chemotherapy   4. Hypokalemia   5. Hypomagnesemia    Cancer Staging  Invasive carcinoma of breast ([place]) Staging form: Breast, AJCC 8th Edition - Clinical stage from 05/16/2021: Stage IIB (cT3, cN0, cM0, G3, ER+, PR+, HER2-) - Signed by [name], MD on 06/03/2021  #Left breast invasive carcinoma, multiple sites.  ER 90%, PR 11-50% positive, HER2 negative by IHC cT3 N0 On neoadjuvant chemotherapy, finished 4 cycles of ddAC Interval MRI showed stable size. Labs reviewed and discussed with patient. Proceed with dose reduced Taxol 17m/m2 today.  #Hypomagnesia, magnesium level has improved.  Continue.  Slow-Mag 1 tablet daily. #Chemotherapy-induced diarrhea, resolved.   #Chemotherapy-induced anemia, stable hemoglobin  .   #Hypokalemia, I recommend patient to increase potassium chloride to 20-meq twice daily. Patient will receive IV potassium chloride 269m x1   Follow up 1 week  All questions were answered. The patient knows to call the clinic with any problems questions or concerns.  cc McLean-Scocuzza, TrOlam IdlerMD, PhD 10/25/2021

## 2021-10-30 ENCOUNTER — Telehealth: Payer: Self-pay | Admitting: Internal Medicine

## 2021-10-30 NOTE — Telephone Encounter (Signed)
Left message to return call 

## 2021-10-30 NOTE — Telephone Encounter (Signed)
Patient called for lab results.

## 2021-10-30 NOTE — Telephone Encounter (Signed)
Tammy Nunez, CMA  10/23/2021  3:53 PM EST Back to Top    Left message to return call.   Letter sent to call in for results.    [name], [facility]  10/16/2021  3:14 PM EST     LMTCB   [name], [facility]  10/14/2021  4:14 PM EST     Attempted to call no answer no voicemail.    Nino Glow McLean-Scocuzza, MD  10/14/2021  8:12 AM EST     No uti  Is pt agreeable to CT ab/pelvis to work up blood in urine?

## 2021-10-31 NOTE — Telephone Encounter (Signed)
Patient calling back in and informed. Patient verbalized understanding States she no longer has any blood in her urine and declines any imaging at this time.

## 2021-11-01 ENCOUNTER — Encounter: Payer: Self-pay | Admitting: Oncology

## 2021-11-01 ENCOUNTER — Inpatient Hospital Stay: Payer: PPO

## 2021-11-01 ENCOUNTER — Other Ambulatory Visit: Payer: Self-pay

## 2021-11-01 ENCOUNTER — Inpatient Hospital Stay (HOSPITAL_BASED_OUTPATIENT_CLINIC_OR_DEPARTMENT_OTHER): Payer: PPO | Admitting: Oncology

## 2021-11-01 VITALS — BP 143/65 | HR 81 | Temp 98.4°F | Resp 18 | Wt 133.5 lb

## 2021-11-01 DIAGNOSIS — C50912 Malignant neoplasm of unspecified site of left female breast: Secondary | ICD-10-CM | POA: Diagnosis not present

## 2021-11-01 DIAGNOSIS — T451X5A Adverse effect of antineoplastic and immunosuppressive drugs, initial encounter: Secondary | ICD-10-CM | POA: Diagnosis not present

## 2021-11-01 DIAGNOSIS — D6481 Anemia due to antineoplastic chemotherapy: Secondary | ICD-10-CM | POA: Diagnosis not present

## 2021-11-01 DIAGNOSIS — Z5111 Encounter for antineoplastic chemotherapy: Secondary | ICD-10-CM | POA: Diagnosis not present

## 2021-11-01 DIAGNOSIS — E876 Hypokalemia: Secondary | ICD-10-CM

## 2021-11-01 DIAGNOSIS — Z170 Estrogen receptor positive status [ER+]: Secondary | ICD-10-CM

## 2021-11-01 DIAGNOSIS — Z95828 Presence of other vascular implants and grafts: Secondary | ICD-10-CM

## 2021-11-01 LAB — COMPREHENSIVE METABOLIC PANEL
ALT: 33 U/L (ref 0–44)
AST: 19 U/L (ref 15–41)
Albumin: 3.7 g/dL (ref 3.5–5.0)
Alkaline Phosphatase: 56 U/L (ref 38–126)
Anion gap: 10 (ref 5–15)
BUN: 16 mg/dL (ref 8–23)
CO2: 23 mmol/L (ref 22–32)
Calcium: 9.4 mg/dL (ref 8.9–10.3)
Chloride: 105 mmol/L (ref 98–111)
Creatinine, Ser: 0.75 mg/dL (ref 0.44–1.00)
GFR, Estimated: >60 mL/min (ref >60)
Glucose, Bld: 171 mg/dL — ABNORMAL HIGH (ref 70–99)
Potassium: 3.7 mmol/L (ref 3.5–5.1)
Sodium: 138 mmol/L (ref 135–145)
Total Bilirubin: 0.5 mg/dL (ref 0.3–1.2)
Total Protein: 6.5 g/dL (ref 6.5–8.1)

## 2021-11-01 LAB — CBC WITH DIFFERENTIAL/PLATELET
Abs Immature Granulocytes: 0.02 10*3/uL (ref 0.00–0.07)
Basophils Absolute: 0 10*3/uL (ref 0.0–0.1)
Basophils Relative: 1 %
Eosinophils Absolute: 0.1 10*3/uL (ref 0.0–0.5)
Eosinophils Relative: 2 %
HCT: 36 % (ref 36.0–46.0)
Hemoglobin: 11.1 g/dL — ABNORMAL LOW (ref 12.0–15.0)
Immature Granulocytes: 1 %
Lymphocytes Relative: 37 %
Lymphs Abs: 1.1 10*3/uL (ref 0.7–4.0)
MCH: 30 pg (ref 26.0–34.0)
MCHC: 30.8 g/dL (ref 30.0–36.0)
MCV: 97.3 fL (ref 80.0–100.0)
Monocytes Absolute: 0.3 10*3/uL (ref 0.1–1.0)
Monocytes Relative: 8 %
Neutro Abs: 1.6 10*3/uL — ABNORMAL LOW (ref 1.7–7.7)
Neutrophils Relative %: 51 %
Platelets: 232 10*3/uL (ref 150–400)
RBC: 3.7 MIL/uL — ABNORMAL LOW (ref 3.87–5.11)
RDW: 14.6 % (ref 11.5–15.5)
WBC: 3 10*3/uL — ABNORMAL LOW (ref 4.0–10.5)
nRBC: 0 % (ref 0.0–0.2)

## 2021-11-01 LAB — MAGNESIUM: Magnesium: 1.8 mg/dL (ref 1.7–2.4)

## 2021-11-01 MED ORDER — DIPHENHYDRAMINE HCL 50 MG/ML IJ SOLN
50.0000 mg | Freq: Once | INTRAMUSCULAR | Status: AC
  Administered 2021-11-01: 50 mg via INTRAVENOUS
  Filled 2021-11-01: qty 1

## 2021-11-01 MED ORDER — HEPARIN SOD (PORK) LOCK FLUSH 100 UNIT/ML IV SOLN
500.0000 [IU] | Freq: Once | INTRAVENOUS | Status: AC
  Administered 2021-11-01: 500 [IU] via INTRAVENOUS
  Filled 2021-11-01: qty 5

## 2021-11-01 MED ORDER — SODIUM CHLORIDE 0.9 % IV SOLN
75.0000 mg/m2 | Freq: Once | INTRAVENOUS | Status: AC
  Administered 2021-11-01: 120 mg via INTRAVENOUS
  Filled 2021-11-01: qty 20

## 2021-11-01 MED ORDER — FAMOTIDINE 20 MG IN NS 100 ML IVPB
20.0000 mg | Freq: Once | INTRAVENOUS | Status: AC
  Administered 2021-11-01: 20 mg via INTRAVENOUS
  Filled 2021-11-01: qty 20

## 2021-11-01 MED ORDER — SODIUM CHLORIDE 0.9 % IV SOLN
Freq: Once | INTRAVENOUS | Status: AC
  Filled 2021-11-01: qty 250

## 2021-11-01 MED ORDER — SODIUM CHLORIDE 0.9 % IV SOLN
10.0000 mg | Freq: Once | INTRAVENOUS | Status: AC
  Administered 2021-11-01: 10 mg via INTRAVENOUS
  Filled 2021-11-01: qty 10

## 2021-11-01 NOTE — Patient Instructions (Signed)
[facility] AT [place]  Discharge Instructions: Thank you for choosing [facility] to provide your oncology and hematology care.  If you have a lab appointment with the [facility], please go directly to the [facility] and check in at the registration area.  Wear comfortable clothing and clothing appropriate for easy access to any Portacath or PICC line.   We strive to give you quality time with your provider. You may need to reschedule your appointment if you arrive late (15 or more minutes).  Arriving late affects you and other patients whose appointments are after yours.  Also, if you miss three or more appointments without notifying the office, you may be dismissed from the clinic at the provider's discretion.      For prescription refill requests, have your pharmacy contact our office and allow 72 hours for refills to be completed.    Today you received the following chemotherapy and/or immunotherapy agents TAXOL      To help prevent nausea and vomiting after your treatment, we encourage you to take your nausea medication as directed.  BELOW ARE SYMPTOMS THAT SHOULD BE REPORTED IMMEDIATELY: *FEVER GREATER THAN 100.4 F (38 C) OR HIGHER *CHILLS OR SWEATING *NAUSEA AND VOMITING THAT IS NOT CONTROLLED WITH YOUR NAUSEA MEDICATION *UNUSUAL SHORTNESS OF BREATH *UNUSUAL BRUISING OR BLEEDING *URINARY PROBLEMS (pain or burning when urinating, or frequent urination) *BOWEL PROBLEMS (unusual diarrhea, constipation, pain near the anus) TENDERNESS IN MOUTH AND THROAT WITH OR WITHOUT PRESENCE OF ULCERS (sore throat, sores in mouth, or a toothache) UNUSUAL RASH, SWELLING OR PAIN  UNUSUAL VAGINAL DISCHARGE OR ITCHING   Items with * indicate a potential emergency and should be followed up as soon as possible or go to the Emergency Department if any problems should occur.  Please show the CHEMOTHERAPY ALERT CARD or IMMUNOTHERAPY ALERT CARD at check-in to the  Emergency Department and triage nurse.  Should you have questions after your visit or need to cancel or reschedule your appointment, please contact [facility] [place] [facility] [place]  [phone] and follow the prompts.  Office hours are 8:00 a.m. to 4:30 p.m. Monday - Friday. Please note that voicemails left after 4:00 p.m. may not be returned until the following business day.  We are closed weekends and major holidays. You have access to a nurse at all times for urgent questions. Please call the main number to the clinic [phone] and follow the prompts.  For any non-urgent questions, you may also contact your provider using MyChart. We now offer e-Visits for anyone 69 and older to request care online for non-urgent symptoms. For details visit [email or web address].   Also download the MyChart app! Go to the app store, search "MyChart", open the app, select [facility], and log in with your MyChart username and password.  Due to Covid, a mask is required upon entering the hospital/clinic. If you do not have a mask, one will be given to you upon arrival. For doctor visits, patients may have 1 support person aged 66 or older with them. For treatment visits, patients cannot have anyone with them due to current Covid guidelines and our immunocompromised population.   Paclitaxel injection What is this medication? PACLITAXEL (PAK li TAX el) is a chemotherapy drug. It targets fast dividing cells, like cancer cells, and causes these cells to die. This medicine is used to treat ovarian cancer, breast cancer, lung cancer, Kaposi's sarcoma, and other cancers. This medicine may be used for other purposes; ask  your health care provider or pharmacist if you have questions. COMMON BRAND NAME(S): Onxol, Taxol What should I tell my care team before I take this medication? They need to know if you have any of these conditions: history of irregular heartbeat liver disease low blood counts, like low  white cell, platelet, or red cell counts lung or breathing disease, like asthma tingling of the fingers or toes, or other nerve disorder an unusual or allergic reaction to paclitaxel, alcohol, polyoxyethylated castor oil, other chemotherapy, other medicines, foods, dyes, or preservatives pregnant or trying to get pregnant breast-feeding How should I use this medication? This drug is given as an infusion into a vein. It is administered in a hospital or clinic by a specially trained health care professional. Talk to your pediatrician regarding the use of this medicine in children. Special care may be needed. Overdosage: If you think you have taken too much of this medicine contact a poison control center or emergency room at once. NOTE: This medicine is only for you. Do not share this medicine with others. What if I miss a dose? It is important not to miss your dose. Call your doctor or health care professional if you are unable to keep an appointment. What may interact with this medication? Do not take this medicine with any of the following medications: live virus vaccines This medicine may also interact with the following medications: antiviral medicines for hepatitis, HIV or AIDS certain antibiotics like erythromycin and clarithromycin certain medicines for fungal infections like ketoconazole and itraconazole certain medicines for seizures like carbamazepine, phenobarbital, phenytoin gemfibrozil nefazodone rifampin St. John's wort This list may not describe all possible interactions. Give your health care provider a list of all the medicines, herbs, non-prescription drugs, or dietary supplements you use. Also tell them if you smoke, drink alcohol, or use illegal drugs. Some items may interact with your medicine. What should I watch for while using this medication? Your condition will be monitored carefully while you are receiving this medicine. You will need important blood work done  while you are taking this medicine. This medicine can cause serious allergic reactions. To reduce your risk you will need to take other medicine(s) before treatment with this medicine. If you experience allergic reactions like skin rash, itching or hives, swelling of the face, lips, or tongue, tell your doctor or health care professional right away. In some cases, you may be given additional medicines to help with side effects. Follow all directions for their use. This drug may make you feel generally unwell. This is not uncommon, as chemotherapy can affect healthy cells as well as cancer cells. Report any side effects. Continue your course of treatment even though you feel ill unless your doctor tells you to stop. Call your doctor or health care professional for advice if you get a fever, chills or sore throat, or other symptoms of a cold or flu. Do not treat yourself. This drug decreases your body's ability to fight infections. Try to avoid being around people who are sick. This medicine may increase your risk to bruise or bleed. Call your doctor or health care professional if you notice any unusual bleeding. Be careful brushing and flossing your teeth or using a toothpick because you may get an infection or bleed more easily. If you have any dental work done, tell your dentist you are receiving this medicine. Avoid taking products that contain aspirin, acetaminophen, ibuprofen, naproxen, or ketoprofen unless instructed by your doctor. These medicines may hide a  fever. Do not become pregnant while taking this medicine. Women should inform their doctor if they wish to become pregnant or think they might be pregnant. There is a potential for serious side effects to an unborn child. Talk to your health care professional or pharmacist for more information. Do not breast-feed an infant while taking this medicine. Men are advised not to father a child while receiving this medicine. This product may contain  alcohol. Ask your pharmacist or healthcare provider if this medicine contains alcohol. Be sure to tell all healthcare providers you are taking this medicine. Certain medicines, like metronidazole and disulfiram, can cause an unpleasant reaction when taken with alcohol. The reaction includes flushing, headache, nausea, vomiting, sweating, and increased thirst. The reaction can last from 30 minutes to several hours. What side effects may I notice from receiving this medication? Side effects that you should report to your doctor or health care professional as soon as possible: allergic reactions like skin rash, itching or hives, swelling of the face, lips, or tongue breathing problems changes in vision fast, irregular heartbeat high or low blood pressure mouth sores pain, tingling, numbness in the hands or feet signs of decreased platelets or bleeding - bruising, pinpoint red spots on the skin, black, tarry stools, blood in the urine signs of decreased red blood cells - unusually weak or tired, feeling faint or lightheaded, falls signs of infection - fever or chills, cough, sore throat, pain or difficulty passing urine signs and symptoms of liver injury like dark yellow or brown urine; general ill feeling or flu-like symptoms; light-colored stools; loss of appetite; nausea; right upper belly pain; unusually weak or tired; yellowing of the eyes or skin swelling of the ankles, feet, hands unusually slow heartbeat Side effects that usually do not require medical attention (report to your doctor or health care professional if they continue or are bothersome): diarrhea hair loss loss of appetite muscle or joint pain nausea, vomiting pain, redness, or irritation at site where injected tiredness This list may not describe all possible side effects. Call your doctor for medical advice about side effects. You may report side effects to FDA at 1-800-FDA-1088. Where should I keep my medication? This drug  is given in a hospital or clinic and will not be stored at home. NOTE: This sheet is a summary. It may not cover all possible information. If you have questions about this medicine, talk to your doctor, pharmacist, or health care provider.  2022 [employer] (2021-07-30 00:00:00)

## 2021-11-01 NOTE — Progress Notes (Signed)
Nutrition Follow-up:  Patient with left breast cancer.  Patient receiving taxol.  Met with patient during infusion.  Patient reports that her appetite is better.  Denies taste alterations, nausea, constipation or diarrhea.  Says that she is eating more than before.  Likes chicken, pork chop, cooked greens, eggs and cheese, fish, butter beans.  Drinks ensure/boost shakes but not daily.     Medications: reviewed  Labs: reviewed  Anthropometrics:   Weight 133 lb 8 oz today  137 lb on 12/2 129 lb on 11/16 134 lb on 10/21 140 lb on 9/9 155 lb on 7/14   NUTRITION DIAGNOSIS: Inadequate oral intake improving   INTERVENTION:  Complimentary case of ensure enlive given to patient today.   Encouraged patient to drink at least 1 shake daily Continue eating well balanced diet, including good sources of protein    MONITORING, EVALUATION, GOAL: weight trends, intake   NEXT VISIT: as needed  Joli B. Allen, RD, LDN Registered Dietitian [phone] (mobile)   

## 2021-11-01 NOTE — Progress Notes (Signed)
Pt here for follow up. No new concerns voiced.   

## 2021-11-01 NOTE — Progress Notes (Signed)
Hematology/Oncology Progress note [phone]    Patient Care Team: McLean-Scocuzza, Nino Glow, MD as PCP - General (Internal Medicine) De Hollingshead, [place] as Pharmacist (Pharmacist) Theodore Demark, RN as Oncology Nurse Navigator  REFERRING PROVIDER: McLean-Scocuzza, Olivia Mackie *  CHIEF COMPLAINTS/REASON FOR VISIT:  multifocal left breast cancer  HISTORY OF PRESENTING ILLNESS:   Tammy Nunez is a  70 y.o.  female with PMH listed below was seen in consultation at the request of  McLean-Scocuzza, Olivia Mackie *  for evaluation of multifocal left breast cancer.   04/24/2021, screening mammogram showed large asymmetry in the superior aspect of the breast with associated calcifications and possible masses and a subtle distortions.  No suspicious findings for malignancy right breast.  05/02/2021 left breast diagnostic mammogram and ultrasound showed multiple irregular mass in the left breast. Mammogram mass 1 irregular mass associated with distortion in the upper breast at middle depth, multiple adjacent and possible continuous irregular masses, conglomeration of masses measures at least 5.6 cm.  Associated pleomorphic calcifications extending at least 5 x 3.9 x 3.7 cm Mass 2, 1 cm mass in the upper breast at the middle to posterior depth. Mass 3, 5 mm irregular mass in the upper breast at posterior depth. In total, mass 1-mass 3 span at least 8.2 cm in anterior-posterior extent.  By ultrasound  mass 1 12:00 5 cm from nipple, 2.7 x 1.5 x 4.3 cm Mass 2, 12:00 12 cm from nipple, 0.9 x 0.9 x 0.5 cm Mass 3   12:00 14 cm from nipple, 0.4 x 0.4 x 0.4 cm-uses 2.4 cm superior to mass to Mass 4   11:00  No suspicious left axillary adenopathy.   05/07/2021 -Ultrasound-guided breast biopsy Breast left 12:00 mass 5 cm from nipple biopsy showed invasive mammary carcinoma, no special type, grade 3, high-grade DCIS present, no LVI, ER 90%, PR 11-50%, HER2 negative by IHC Breast left 11:00  mass 10 cm from nipple biopsy showed invasive mammary carcinoma, no special type, grade 2, no DCIS/LVI identified.ER 90%, PR 11-50%, HER2 negative by IHC  Patient is married has no children.  Denies any hormone replacement, oral contraceptive use, family history of breast cancer.  Denies any chest radiation. Patient has a history of stroke with chronic residual weakness of the left side.  She walks with a cane.  05/22/2021 MRI breast showed that nonmass like malignancy spanning over 8 cm, abuting anterior aspect of pectoralis muscle. Discussed with Dr.Cintron and we agree that she will need mastectomy. There is concern of possible positive margin. I recommend neoadjuvant chemotherapy  # medi port placed on 06/06/2021. 06/13/2021 pre chemotherapy Echo. LVEF 60-65%.  Normal global longitudinal strain. Grade 1 diastolic dysfunction.   # 05/22/2021 MRI breast showed  Biopsy-proven malignancy within the UPPER LEFT breast spanning a distance of 8.3 x 4.2 x 4 cm nonmass like enhancement abuts the anterior aspect of the LEFT pectoralis muscle without gross invasion. Oncotype Dx 27. Discussed with Dr.Cintron, there is concern of positive margin.   #Chemotherapy 06/21/2021 - 08/16/2021 ddAC x4 with G-CSF support. 08/30/2021 - 09/06/2021, weekly Taxol. Chemotherapy break due to side effects, UTI and the patient's preference. 10/25/2021  INTERVAL HISTORY Tammy Nunez is a 70 y.o. female who has above history reviewed by me today presents for follow up visit for chemotherapy of breast cancer Patient was accompanied by her husband. Appetite is fair.  She has lost 4 pounds since last visit.  Denies any nausea vomiting diarrhea.  She feels well today.  Denies  numbness tingling  Review of Systems  Constitutional:  Positive for fatigue. Negative for appetite change, chills and fever.  HENT:   Negative for hearing loss and voice change.   Eyes:  Negative for eye problems.  Respiratory:  Negative for chest  tightness and cough.   Cardiovascular:  Negative for chest pain.  Gastrointestinal:  Negative for abdominal distention, abdominal pain, blood in stool, diarrhea and nausea.  Endocrine: Negative for hot flashes.  Genitourinary:  Negative for difficulty urinating, dysuria and frequency.   Musculoskeletal:  Negative for arthralgias.  Skin:  Negative for itching and rash.  Neurological:  Negative for extremity weakness.  Hematological:  Negative for adenopathy. Does not bruise/bleed easily.  Psychiatric/Behavioral:  Negative for confusion.    MEDICAL HISTORY:  Past Medical History:  Diagnosis Date   Breast cancer in female [facility]) 05/08/2021   Chronic left shoulder pain    Coronary artery disease    Diabetes mellitus without complication ([place])    Dysrhythmia    Hypertension    Hypomagnesemia 09/13/2021   Paralysis ([place])    weakness left upper amd lower extremity   PONV (postoperative nausea and vomiting)    Stroke [facility])     SURGICAL HISTORY: Past Surgical History:  Procedure Laterality Date   BREAST BIOPSY Left 05/07/2021   12:00 5cmfn vision clip path pending   BREAST BIOPSY Left 05/07/2021   11:00 10 cmfn mini cork clip path pending   CAROTID PTA/STENT INTERVENTION Left 07/20/2019   Procedure: CAROTID PTA/STENT INTERVENTION;  Surgeon: Katha Cabal, MD;  Location: [place] [facility];  Service: Cardiovascular;  Laterality: Left;   ECTOPIC PREGNANCY SURGERY     PORTACATH PLACEMENT N/A 06/06/2021   Procedure: INSERTION PORT-A-CATH;  Surgeon: Herbert Pun, MD;  Location: [facility] OR[facility];  Service: General;  Laterality: N/A;    SOCIAL HISTORY: Social History   Socioeconomic History   Marital status: Married    Spouse name: Not on file   Number of children: Not on file   Years of education: Not on file   Highest education level: Not on file  Occupational History   Not on file  Tobacco Use   Smoking status: Former    Packs/day: 0.50    Years: 15.00    Pack years:  7.50    Types: Cigarettes    Quit date: 04/11/2019    Years since quitting: 2.5   Smokeless tobacco: Never  Vaping Use   Vaping Use: Never used  Substance and Sexual Activity   Alcohol use: Never   Drug use: Never   Sexual activity: Not Currently  Other Topics Concern   Not on file  Social History Narrative   No kids    Married with husband    Used to work [employer] broadcast assistant    Social Determinants of Radio broadcast assistant Strain: Low Risk    Difficulty of Paying Living Expenses: Not hard at all  Food Insecurity: Not on file  Transportation Needs: Not on file  Physical Activity: Not on file  Stress: Not on file  Social Connections: Not on file  Intimate Partner Violence: Not on file    FAMILY HISTORY: Family History  Problem Relation Age of Onset   Diabetes type II Mother    Hypertension Father    Heart attack Father    Diabetes type II Brother    Breast cancer Neg Hx     ALLERGIES:  has No Known Allergies.  MEDICATIONS:  Current Outpatient Medications  Medication Sig Dispense Refill   amLODipine (NORVASC) 10 MG tablet Take 1 tablet (10 mg total) by mouth daily. 90 tablet 3   aspirin 81 MG EC tablet Take 1 tablet (81 mg total) by mouth daily. 90 tablet 3   atorvastatin (LIPITOR) 80 MG tablet Take 1 tablet (80 mg total) by mouth daily. 90 tablet 3   blood glucose meter kit and supplies 1 each by Other route 4 (four) times daily. Dispense based on patient and insurance preference. Four times daily as directed. (FOR ICD-10 E10.9, E11.9). 1 each 0   carvedilol (COREG) 25 MG tablet Take 1 tablet (25 mg total) by mouth 2 (two) times daily with a meal. 180 tablet 3   clopidogrel (PLAVIX) 75 MG tablet Take 1 tablet (75 mg total) by mouth daily. Further refills [facility] neurology 90 tablet 3   Continuous Blood Gluc Sensor (FREESTYLE LIBRE 2 SENSOR) MISC USE TO CHECK GLUCOSE AT LEAST EVERY 8 HOURS 6 each 0   empagliflozin (JARDIANCE) 25 MG TABS tablet Take 1  tablet (25 mg total) by mouth daily. 90 tablet 3   lidocaine-prilocaine (EMLA) cream Apply to affected area once 30 g 3   lisinopril (ZESTRIL) 40 MG tablet Take 1 tablet (40 mg total) by mouth daily. 90 tablet 3   loperamide (IMODIUM) 2 MG capsule Take 1 capsule (2 mg total) by mouth See admin instructions. Take 43m at the onset of diarrhea, and then 213mafter each loose bowel movement, maximum 1683mer 24 hours. 30 capsule 1   loratadine (CLARITIN) 10 MG tablet Take 1 tablet (10 mg total) by mouth daily. 90 tablet 3   magnesium chloride (SLOW-MAG) 64 MG TBEC SR tablet Take 1 tablet (64 mg total) by mouth daily. 30 tablet 1   Multiple Vitamin (MULTIVITAMIN) tablet Take 1 tablet by mouth daily.     ondansetron (ZOFRAN) 8 MG tablet Take 1 tablet (8 mg total) by mouth 2 (two) times daily as needed. Start on the third day after chemotherapy. 30 tablet 1   potassium chloride SA (KLOR-CON M20) 20 MEQ tablet Take 1 tablet (20 mEq total) by mouth daily. 90 tablet 3   prochlorperazine (COMPAZINE) 10 MG tablet Take 1 tablet (10 mg total) by mouth every 6 (six) hours as needed (Nausea or vomiting). 30 tablet 1   vitamin B-12 (CYANOCOBALAMIN) 1000 MCG tablet Take 1 tablet (1,000 mcg total) by mouth daily. 30 tablet 1   Vitamin D, Cholecalciferol, 50 MCG (2000 UT) CAPS Take 2,000 Units by mouth.     No current facility-administered medications for this visit.   Facility-Administered Medications Ordered in Other Visits  Medication Dose Route Frequency Provider Last Rate Last Admin   heparin lock flush 100 UNIT/ML injection              PHYSICAL EXAMINATION: ECOG PERFORMANCE STATUS: 1 - Symptomatic but completely ambulatory Vitals:   11/01/21 1006  BP: (!) 143/65  Pulse: 81  Resp: 18  Temp: 98.4 F (36.9 C)   Filed Weights   11/01/21 1006  Weight: 133 lb 8 oz (60.6 kg)    Physical Exam Constitutional:      General: She is not in acute distress. HENT:     Head: Normocephalic and atraumatic.   Eyes:     General: No scleral icterus. Cardiovascular:     Rate and Rhythm: Normal rate and regular rhythm.     Heart sounds: Normal heart sounds.  Pulmonary:     Effort: Pulmonary effort is  normal. No respiratory distress.     Breath sounds: No wheezing.  Abdominal:     General: Bowel sounds are normal. There is no distension.     Palpations: Abdomen is soft.  Musculoskeletal:        General: No deformity. Normal range of motion.     Cervical back: Normal range of motion and neck supple.  Skin:    General: Skin is warm and dry.     Findings: No erythema or rash.  Neurological:     Mental Status: She is alert and oriented to person, place, and time. Mental status is at baseline.     Comments: Chronic left upper and lower extremity weakness.  Psychiatric:        Mood and Affect: Mood normal.      LABORATORY DATA:  I have reviewed the data as listed Lab Results  Component Value Date   WBC 3.0 (L) 11/01/2021   HGB 11.1 (L) 11/01/2021   HCT 36.0 11/01/2021   MCV 97.3 11/01/2021   PLT 232 11/01/2021   Recent Labs    10/03/21 1350 10/25/21 0945 11/01/21 0950  NA 139 141 138  K 3.6 2.7* 3.7  CL 115* 106 105  CO2 16* 25 23  GLUCOSE 107* 125* 171*  BUN 15 10 16   CREATININE 0.93 0.65 0.75  CALCIUM 8.8* 8.7* 9.4  GFRNONAA >60 >60 >60  PROT 5.9* 6.3* 6.5  ALBUMIN 3.4* 3.6 3.7  AST 16 26 19   ALT 15 21 33  ALKPHOS 47 61 56  BILITOT 0.5 0.3 0.5    Iron/TIBC/Ferritin/ %Sat    Component Value Date/Time   IRON 113 12/08/2019 1015   TIBC 317 12/08/2019 1015   FERRITIN 67 12/08/2019 1015   IRONPCTSAT 36 12/08/2019 1015       RADIOGRAPHIC STUDIES: I have personally reviewed the radiological images as listed and agreed with the findings in the report. MR BREAST BILATERAL W [place] CAD  Result Date: 08/26/2021 CLINICAL DATA:  Evaluate treatment response. Patient has completed 5 weeks of chemotherapy. Ultrasound biopsy of a conglomerate of masses in the LEFT  breast 12 o'clock 5 centimeters from nipple showed invasive mammary carcinoma. Biopsy of LEFT breast 11 o'clock location 10 centimeters from the nipple showed invasive mammary carcinoma. Additional masses in the LEFT breast identified with ultrasound were not biopsied. LABS:  None obtained at the time of imaging. EXAM: BILATERAL BREAST MRI WITH AND WITHOUT CONTRAST TECHNIQUE: Multiplanar, multisequence MR images of both breasts were obtained prior to and following the intravenous administration of 6 ml of Gadavist Three-dimensional MR images were rendered by post-processing of the original MR data on an independent workstation. The three-dimensional MR images were interpreted, and findings are reported in the following complete MRI report for this study. Three dimensional images were evaluated at the independent interpreting workstation using the DynaCAD thin client. COMPARISON:  MRI on 05/22/2021 FINDINGS: Breast composition: c. Heterogeneous fibroglandular tissue. Background parenchymal enhancement: Minimal Right breast: No mass or abnormal enhancement. Interval placement of RIGHT-sided Port-A-Cath. Left breast: Numerous masses and adjacent non mass enhancement in the UPPER LEFT breast spans 9.1 x 3.5 x 3.4 centimeters. Previously, mass measured 8.3 x 4.2 x 4.0 centimeters. Mass shows rapid washout type enhancement kinetics. Non mass enhancement is again noted to extend to the LEFT pectoralis muscle, without pectoralis enhancement. Lymph nodes: No abnormal appearing lymph nodes. Ancillary findings:  None. IMPRESSION: 1. Masses and non mass enhancement in the UPPER LEFT breast, similar in appearance  and size compared to prior study. 2. RIGHT breast remains negative. RECOMMENDATION: Treatment plan for LEFT breast malignancy. BI-RADS CATEGORY  6: Known biopsy-proven malignancy. Electronically Signed   By: Nolon Nations M.D.   On: 08/26/2021 13:49      ASSESSMENT & PLAN:  1. Malignant neoplasm of left breast  in female, estrogen receptor positive, unspecified site of breast ([place])   2. Encounter for antineoplastic chemotherapy   3. Antineoplastic chemotherapy induced anemia   4. Hypomagnesemia   5. Hypokalemia    Cancer Staging  Invasive carcinoma of breast ([place]) Staging form: Breast, AJCC 8th Edition - Clinical stage from 05/16/2021: Stage IIB (cT3, cN0, cM0, G3, ER+, PR+, HER2-) - Signed by [name], MD on 06/03/2021  #Left breast invasive carcinoma, multiple sites.  ER 90%, PR 11-50% positive, HER2 negative by IHC cT3 N0 On neoadjuvant chemotherapy, finished 4 cycles of ddAC Interval MRI showed stable size. Labs reviewed and discussed with patient. Proceed with dose reduced Taxol 75 mg/M2 today.    #Hypomagnesia, magnesium level has improved.  Continue.  Slow-Mag 1 tablet daily. #Chemotherapy-induced anemia, stable hemoglobin.  Monitor. Marland Kitchen   #Hypokalemia, potassium has improved.  Continue potassium chloride to 20-meq twice daily.  Follow up 1 week  All questions were answered. The patient knows to call the clinic with any problems questions or concerns.  cc McLean-Scocuzza, Olam Idler, MD, PhD 11/01/2021

## 2021-11-08 ENCOUNTER — Other Ambulatory Visit: Payer: Self-pay

## 2021-11-08 ENCOUNTER — Inpatient Hospital Stay: Payer: PPO

## 2021-11-08 ENCOUNTER — Inpatient Hospital Stay (HOSPITAL_BASED_OUTPATIENT_CLINIC_OR_DEPARTMENT_OTHER): Payer: PPO | Admitting: Oncology

## 2021-11-08 ENCOUNTER — Encounter: Payer: Self-pay | Admitting: Oncology

## 2021-11-08 VITALS — BP 120/67 | HR 89 | Temp 98.6°F | Resp 18 | Wt 129.5 lb

## 2021-11-08 DIAGNOSIS — E876 Hypokalemia: Secondary | ICD-10-CM

## 2021-11-08 DIAGNOSIS — C50912 Malignant neoplasm of unspecified site of left female breast: Secondary | ICD-10-CM

## 2021-11-08 DIAGNOSIS — D701 Agranulocytosis secondary to cancer chemotherapy: Secondary | ICD-10-CM

## 2021-11-08 DIAGNOSIS — D6481 Anemia due to antineoplastic chemotherapy: Secondary | ICD-10-CM | POA: Diagnosis not present

## 2021-11-08 DIAGNOSIS — T451X5A Adverse effect of antineoplastic and immunosuppressive drugs, initial encounter: Secondary | ICD-10-CM

## 2021-11-08 DIAGNOSIS — Z5111 Encounter for antineoplastic chemotherapy: Secondary | ICD-10-CM | POA: Diagnosis not present

## 2021-11-08 LAB — COMPREHENSIVE METABOLIC PANEL
ALT: 18 U/L (ref 0–44)
AST: 16 U/L (ref 15–41)
Albumin: 3.8 g/dL (ref 3.5–5.0)
Alkaline Phosphatase: 66 U/L (ref 38–126)
Anion gap: 11 (ref 5–15)
BUN: 18 mg/dL (ref 8–23)
CO2: 19 mmol/L — ABNORMAL LOW (ref 22–32)
Calcium: 9.9 mg/dL (ref 8.9–10.3)
Chloride: 105 mmol/L (ref 98–111)
Creatinine, Ser: 0.86 mg/dL (ref 0.44–1.00)
GFR, Estimated: >60 mL/min (ref >60)
Glucose, Bld: 157 mg/dL — ABNORMAL HIGH (ref 70–99)
Potassium: 4 mmol/L (ref 3.5–5.1)
Sodium: 135 mmol/L (ref 135–145)
Total Bilirubin: 0.6 mg/dL (ref 0.3–1.2)
Total Protein: 6.9 g/dL (ref 6.5–8.1)

## 2021-11-08 LAB — CBC WITH DIFFERENTIAL/PLATELET
Abs Immature Granulocytes: 0.03 10*3/uL (ref 0.00–0.07)
Basophils Absolute: 0 10*3/uL (ref 0.0–0.1)
Basophils Relative: 1 %
Eosinophils Absolute: 0 10*3/uL (ref 0.0–0.5)
Eosinophils Relative: 2 %
HCT: 34.8 % — ABNORMAL LOW (ref 36.0–46.0)
Hemoglobin: 11.1 g/dL — ABNORMAL LOW (ref 12.0–15.0)
Immature Granulocytes: 1 %
Lymphocytes Relative: 36 %
Lymphs Abs: 0.8 10*3/uL (ref 0.7–4.0)
MCH: 30.7 pg (ref 26.0–34.0)
MCHC: 31.9 g/dL (ref 30.0–36.0)
MCV: 96.1 fL (ref 80.0–100.0)
Monocytes Absolute: 0.2 10*3/uL (ref 0.1–1.0)
Monocytes Relative: 8 %
Neutro Abs: 1.2 10*3/uL — ABNORMAL LOW (ref 1.7–7.7)
Neutrophils Relative %: 52 %
Platelets: 214 10*3/uL (ref 150–400)
RBC: 3.62 MIL/uL — ABNORMAL LOW (ref 3.87–5.11)
RDW: 14.3 % (ref 11.5–15.5)
WBC: 2.3 10*3/uL — ABNORMAL LOW (ref 4.0–10.5)
nRBC: 0 % (ref 0.0–0.2)

## 2021-11-08 LAB — MAGNESIUM: Magnesium: 1.9 mg/dL (ref 1.7–2.4)

## 2021-11-08 MED ORDER — FAMOTIDINE 20 MG IN NS 100 ML IVPB
20.0000 mg | Freq: Once | INTRAVENOUS | Status: AC
  Administered 2021-11-08: 20 mg via INTRAVENOUS
  Filled 2021-11-08: qty 20

## 2021-11-08 MED ORDER — SODIUM CHLORIDE 0.9 % IV SOLN
10.0000 mg | Freq: Once | INTRAVENOUS | Status: AC
  Administered 2021-11-08: 10 mg via INTRAVENOUS
  Filled 2021-11-08: qty 10

## 2021-11-08 MED ORDER — SODIUM CHLORIDE 0.9 % IV SOLN
65.0000 mg/m2 | Freq: Once | INTRAVENOUS | Status: AC
  Administered 2021-11-08: 102 mg via INTRAVENOUS
  Filled 2021-11-08: qty 17

## 2021-11-08 MED ORDER — HEPARIN SOD (PORK) LOCK FLUSH 100 UNIT/ML IV SOLN
500.0000 [IU] | Freq: Once | INTRAVENOUS | Status: AC | PRN
  Filled 2021-11-08: qty 5

## 2021-11-08 MED ORDER — DIPHENHYDRAMINE HCL 50 MG/ML IJ SOLN
50.0000 mg | Freq: Once | INTRAMUSCULAR | Status: AC
  Administered 2021-11-08: 50 mg via INTRAVENOUS
  Filled 2021-11-08: qty 1

## 2021-11-08 MED ORDER — HEPARIN SOD (PORK) LOCK FLUSH 100 UNIT/ML IV SOLN
INTRAVENOUS | Status: AC
  Administered 2021-11-08: 500 [IU]
  Filled 2021-11-08: qty 5

## 2021-11-08 MED ORDER — SODIUM CHLORIDE 0.9 % IV SOLN
Freq: Once | INTRAVENOUS | Status: AC
  Filled 2021-11-08: qty 250

## 2021-11-08 NOTE — Progress Notes (Signed)
Hematology/Oncology Progress note [phone]    Patient Care Team: McLean-Scocuzza, Nino Glow, MD as PCP - General (Internal Medicine) De Hollingshead, [place] as Pharmacist (Pharmacist) Theodore Demark, RN as Oncology Nurse Navigator  REFERRING PROVIDER: McLean-Scocuzza, Olivia Mackie *  CHIEF COMPLAINTS/REASON FOR VISIT:  multifocal left breast cancer  HISTORY OF PRESENTING ILLNESS:   Tammy Nunez is a  70 y.o.  female with PMH listed below was seen in consultation at the request of  McLean-Scocuzza, Olivia Mackie *  for evaluation of multifocal left breast cancer.   04/24/2021, screening mammogram showed large asymmetry in the superior aspect of the breast with associated calcifications and possible masses and a subtle distortions.  No suspicious findings for malignancy right breast.  05/02/2021 left breast diagnostic mammogram and ultrasound showed multiple irregular mass in the left breast. Mammogram mass 1 irregular mass associated with distortion in the upper breast at middle depth, multiple adjacent and possible continuous irregular masses, conglomeration of masses measures at least 5.6 cm.  Associated pleomorphic calcifications extending at least 5 x 3.9 x 3.7 cm Mass 2, 1 cm mass in the upper breast at the middle to posterior depth. Mass 3, 5 mm irregular mass in the upper breast at posterior depth. In total, mass 1-mass 3 span at least 8.2 cm in anterior-posterior extent.  By ultrasound  mass 1 12:00 5 cm from nipple, 2.7 x 1.5 x 4.3 cm Mass 2, 12:00 12 cm from nipple, 0.9 x 0.9 x 0.5 cm Mass 3   12:00 14 cm from nipple, 0.4 x 0.4 x 0.4 cm-uses 2.4 cm superior to mass to Mass 4   11:00  No suspicious left axillary adenopathy.   05/07/2021 -Ultrasound-guided breast biopsy Breast left 12:00 mass 5 cm from nipple biopsy showed invasive mammary carcinoma, no special type, grade 3, high-grade DCIS present, no LVI, ER 90%, PR 11-50%, HER2 negative by IHC Breast left 11:00  mass 10 cm from nipple biopsy showed invasive mammary carcinoma, no special type, grade 2, no DCIS/LVI identified.ER 90%, PR 11-50%, HER2 negative by IHC  Patient is married has no children.  Denies any hormone replacement, oral contraceptive use, family history of breast cancer.  Denies any chest radiation. Patient has a history of stroke with chronic residual weakness of the left side.  She walks with a cane.  05/22/2021 MRI breast showed that nonmass like malignancy spanning over 8 cm, abuting anterior aspect of pectoralis muscle. Discussed with Dr.Cintron and we agree that she will need mastectomy. There is concern of possible positive margin. I recommend neoadjuvant chemotherapy  # medi port placed on 06/06/2021. 06/13/2021 pre chemotherapy Echo. LVEF 60-65%.  Normal global longitudinal strain. Grade 1 diastolic dysfunction.   # 05/22/2021 MRI breast showed  Biopsy-proven malignancy within the UPPER LEFT breast spanning a distance of 8.3 x 4.2 x 4 cm nonmass like enhancement abuts the anterior aspect of the LEFT pectoralis muscle without gross invasion. Oncotype Dx 27. Discussed with Dr.Cintron, there is concern of positive margin.   #Chemotherapy 06/21/2021 - 08/16/2021 ddAC x4 with G-CSF support. 08/30/2021 - 09/06/2021, weekly Taxol. Chemotherapy break due to side effects, UTI and the patient's preference. 10/25/2021  INTERVAL HISTORY Tammy Nunez is a 70 y.o. female who has above history reviewed by me today presents for follow up visit for chemotherapy of breast cancer Patient was accompanied by her husband. Appetite is fair.  She has lost weight, 4 pounds.  Denies any nausea vomiting diarrhea.  She feels okay today.  Review of Systems  Constitutional:  Positive for fatigue. Negative for appetite change, chills and fever.  HENT:   Negative for hearing loss and voice change.   Eyes:  Negative for eye problems.  Respiratory:  Negative for chest tightness and cough.   Cardiovascular:   Negative for chest pain.  Gastrointestinal:  Negative for abdominal distention, abdominal pain, blood in stool, diarrhea and nausea.  Endocrine: Negative for hot flashes.  Genitourinary:  Negative for difficulty urinating, dysuria and frequency.   Musculoskeletal:  Negative for arthralgias.  Skin:  Negative for itching and rash.  Neurological:  Negative for extremity weakness.  Hematological:  Negative for adenopathy. Does not bruise/bleed easily.  Psychiatric/Behavioral:  Negative for confusion.    MEDICAL HISTORY:  Past Medical History:  Diagnosis Date   Breast cancer in female [facility]) 05/08/2021   Chronic left shoulder pain    Coronary artery disease    Diabetes mellitus without complication ([employer])    Dysrhythmia    Hypertension    Hypomagnesemia 09/13/2021   Paralysis ([place])    weakness left upper amd lower extremity   PONV (postoperative nausea and vomiting)    Stroke [facility])     SURGICAL HISTORY: Past Surgical History:  Procedure Laterality Date   BREAST BIOPSY Left 05/07/2021   12:00 5cmfn vision clip path pending   BREAST BIOPSY Left 05/07/2021   11:00 10 cmfn mini cork clip path pending   CAROTID PTA/STENT INTERVENTION Left 07/20/2019   Procedure: CAROTID PTA/STENT INTERVENTION;  Surgeon: Katha Cabal, MD;  Location: [place] [facility];  Service: Cardiovascular;  Laterality: Left;   ECTOPIC PREGNANCY SURGERY     PORTACATH PLACEMENT N/A 06/06/2021   Procedure: INSERTION PORT-A-CATH;  Surgeon: Herbert Pun, MD;  Location: [facility] ORS;  Service: General;  Laterality: N/A;    SOCIAL HISTORY: Social History   Socioeconomic History   Marital status: Married    Spouse name: Not on file   Number of children: Not on file   Years of education: Not on file   Highest education level: Not on file  Occupational History   Not on file  Tobacco Use   Smoking status: Former    Packs/day: 0.50    Years: 15.00    Pack years: 7.50    Types: Cigarettes    Quit date:  04/11/2019    Years since quitting: 2.5   Smokeless tobacco: Never  Vaping Use   Vaping Use: Never used  Substance and Sexual Activity   Alcohol use: Never   Drug use: Never   Sexual activity: Not Currently  Other Topics Concern   Not on file  Social History Narrative   No kids    Married with husband    Used to work [employer] broadcast assistant    Social Determinants of Radio broadcast assistant Strain: Low Risk    Difficulty of Paying Living Expenses: Not hard at all  Food Insecurity: Not on file  Transportation Needs: Not on file  Physical Activity: Not on file  Stress: Not on file  Social Connections: Not on file  Intimate Partner Violence: Not on file    FAMILY HISTORY: Family History  Problem Relation Age of Onset   Diabetes type II Mother    Hypertension Father    Heart attack Father    Diabetes type II Brother    Breast cancer Neg Hx     ALLERGIES:  has No Known Allergies.  MEDICATIONS:  Current Outpatient Medications  Medication Sig Dispense Refill   amLODipine (  NORVASC) 10 MG tablet Take 1 tablet (10 mg total) by mouth daily. 90 tablet 3   aspirin 81 MG EC tablet Take 1 tablet (81 mg total) by mouth daily. 90 tablet 3   atorvastatin (LIPITOR) 80 MG tablet Take 1 tablet (80 mg total) by mouth daily. 90 tablet 3   blood glucose meter kit and supplies 1 each by Other route 4 (four) times daily. Dispense based on patient and insurance preference. Four times daily as directed. (FOR ICD-10 E10.9, E11.9). 1 each 0   carvedilol (COREG) 25 MG tablet Take 1 tablet (25 mg total) by mouth 2 (two) times daily with a meal. 180 tablet 3   clopidogrel (PLAVIX) 75 MG tablet Take 1 tablet (75 mg total) by mouth daily. Further refills [facility] neurology 90 tablet 3   Continuous Blood Gluc Sensor (FREESTYLE LIBRE 2 SENSOR) MISC USE TO CHECK GLUCOSE AT LEAST EVERY 8 HOURS 6 each 0   empagliflozin (JARDIANCE) 25 MG TABS tablet Take 1 tablet (25 mg total) by mouth daily. 90  tablet 3   lidocaine-prilocaine (EMLA) cream Apply to affected area once 30 g 3   lisinopril (ZESTRIL) 40 MG tablet Take 1 tablet (40 mg total) by mouth daily. 90 tablet 3   loperamide (IMODIUM) 2 MG capsule Take 1 capsule (2 mg total) by mouth See admin instructions. Take 97m at the onset of diarrhea, and then 259mafter each loose bowel movement, maximum 1629mer 24 hours. 30 capsule 1   loratadine (CLARITIN) 10 MG tablet Take 1 tablet (10 mg total) by mouth daily. 90 tablet 3   magnesium chloride (SLOW-MAG) 64 MG TBEC SR tablet Take 1 tablet (64 mg total) by mouth daily. 30 tablet 1   Multiple Vitamin (MULTIVITAMIN) tablet Take 1 tablet by mouth daily.     ondansetron (ZOFRAN) 8 MG tablet Take 1 tablet (8 mg total) by mouth 2 (two) times daily as needed. Start on the third day after chemotherapy. 30 tablet 1   potassium chloride SA (KLOR-CON M20) 20 MEQ tablet Take 1 tablet (20 mEq total) by mouth daily. 90 tablet 3   prochlorperazine (COMPAZINE) 10 MG tablet Take 1 tablet (10 mg total) by mouth every 6 (six) hours as needed (Nausea or vomiting). 30 tablet 1   vitamin B-12 (CYANOCOBALAMIN) 1000 MCG tablet Take 1 tablet (1,000 mcg total) by mouth daily. 30 tablet 1   Vitamin D, Cholecalciferol, 50 MCG (2000 UT) CAPS Take 2,000 Units by mouth.     No current facility-administered medications for this visit.   Facility-Administered Medications Ordered in Other Visits  Medication Dose Route Frequency Provider Last Rate Last Admin   heparin lock flush 100 UNIT/ML injection              PHYSICAL EXAMINATION: ECOG PERFORMANCE STATUS: 1 - Symptomatic but completely ambulatory Vitals:   11/08/21 0857  BP: 120/67  Pulse: 89  Resp: 18  Temp: 98.6 F (37 C)   Filed Weights   11/08/21 0857  Weight: 129 lb 8 oz (58.7 kg)    Physical Exam Constitutional:      General: She is not in acute distress. HENT:     Head: Normocephalic and atraumatic.  Eyes:     General: No scleral  icterus. Cardiovascular:     Rate and Rhythm: Normal rate and regular rhythm.     Heart sounds: Normal heart sounds.  Pulmonary:     Effort: Pulmonary effort is normal. No respiratory distress.  Breath sounds: No wheezing.  Abdominal:     General: Bowel sounds are normal. There is no distension.     Palpations: Abdomen is soft.  Musculoskeletal:        General: No deformity. Normal range of motion.     Cervical back: Normal range of motion and neck supple.  Skin:    General: Skin is warm and dry.     Findings: No erythema or rash.  Neurological:     Mental Status: She is alert and oriented to person, place, and time. Mental status is at baseline.     Comments: Chronic left upper and lower extremity weakness.  Psychiatric:        Mood and Affect: Mood normal.      LABORATORY DATA:  I have reviewed the data as listed Lab Results  Component Value Date   WBC 2.3 (L) 11/08/2021   HGB 11.1 (L) 11/08/2021   HCT 34.8 (L) 11/08/2021   MCV 96.1 11/08/2021   PLT 214 11/08/2021   Recent Labs    10/25/21 0945 11/01/21 0950 11/08/21 0841  NA 141 138 135  K 2.7* 3.7 4.0  CL 106 105 105  CO2 25 23 19*  GLUCOSE 125* 171* 157*  BUN _0 CREATININE 0.65 0.75 0.86  CALCIUM 8.7* 9.4 9.9  GFRNONAA >60 >60 >60  PROT 6.3* 6.5 6.9  ALBUMIN 3.6 3.7 3.8  AST _1 ALT 21 33 18  ALKPHOS 61 56 66  BILITOT 0.3 0.5 0.6    Iron/TIBC/Ferritin/ %Sat    Component Value Date/Time   IRON 113 12/08/2019 1015   TIBC 317 12/08/2019 1015   FERRITIN 67 12/08/2019 1015   IRONPCTSAT 36 12/08/2019 1015       RADIOGRAPHIC STUDIES: I have personally reviewed the radiological images as listed and agreed with the findings in the report. MR BREAST BILATERAL [facility] [place] CAD  Result Date: 08/26/2021 CLINICAL DATA:  Evaluate treatment response. Patient has completed 5 weeks of chemotherapy. Ultrasound biopsy of a conglomerate of masses in the LEFT breast 12 o'clock 5 centimeters  from nipple showed invasive mammary carcinoma. Biopsy of LEFT breast 11 o'clock location 10 centimeters from the nipple showed invasive mammary carcinoma. Additional masses in the LEFT breast identified with ultrasound were not biopsied. LABS:  None obtained at the time of imaging. EXAM: BILATERAL BREAST MRI WITH AND WITHOUT CONTRAST TECHNIQUE: Multiplanar, multisequence MR images of both breasts were obtained prior to and following the intravenous administration of 6 ml of Gadavist Three-dimensional MR images were rendered by post-processing of the original MR data on an independent workstation. The three-dimensional MR images were interpreted, and findings are reported in the following complete MRI report for this study. Three dimensional images were evaluated at the independent interpreting workstation using the DynaCAD thin client. COMPARISON:  MRI on 05/22/2021 FINDINGS: Breast composition: c. Heterogeneous fibroglandular tissue. Background parenchymal enhancement: Minimal Right breast: No mass or abnormal enhancement. Interval placement of RIGHT-sided Port-A-Cath. Left breast: Numerous masses and adjacent non mass enhancement in the UPPER LEFT breast spans 9.1 x 3.5 x 3.4 centimeters. Previously, mass measured 8.3 x 4.2 x 4.0 centimeters. Mass shows rapid washout type enhancement kinetics. Non mass enhancement is again noted to extend to the LEFT pectoralis muscle, without pectoralis enhancement. Lymph nodes: No abnormal appearing lymph nodes. Ancillary findings:  None. IMPRESSION: 1. Masses and non mass enhancement in the UPPER LEFT breast, similar in appearance and size compared to prior study. 2.  RIGHT breast remains negative. RECOMMENDATION: Treatment plan for LEFT breast malignancy. BI-RADS CATEGORY  6: Known biopsy-proven malignancy. Electronically Signed   By: Nolon Nations M.D.   On: 08/26/2021 13:49      ASSESSMENT & PLAN:  1. Malignant neoplasm of left female breast, unspecified estrogen  receptor status, unspecified site of breast ([place])   2. Encounter for antineoplastic chemotherapy   3. Antineoplastic chemotherapy induced anemia   4. Chemotherapy induced neutropenia ([facility])   5. Hypomagnesemia   6. Hypokalemia    Cancer Staging  Invasive carcinoma of breast ([place]) Staging form: Breast, AJCC 8th Edition - Clinical stage from 05/16/2021: Stage IIB (cT3, cN0, cM0, G3, ER+, PR+, HER2-) - Signed by [name], MD on 06/03/2021  #Left breast invasive carcinoma, multiple sites.  ER 90%, PR 11-50% positive, HER2 negative by IHC cT3 N0 On neoadjuvant chemotherapy, finished 4 cycles of ddAC Interval MRI showed stable size. Labs reviewed and discussed with patient.  Proceed with dose reduced Taxol, 65 mg/m2 today.  #Weight loss, recommend nutrition supplementation. #Hypomagnesia, magnesium level has improved.  Continue Slow-Mag 1 tablet daily. #Chemotherapy-induced anemia, hemoglobin is stable at 11.1. #Chemotherapy-induced neutropenia, ANC 1.2.  Dose reduction. #Hypokalemia, continue potassium chloride to 20-meq twice daily.  Potassium is stable. Patient tolerates chemotherapy with moderate difficulties.  She desires to have a break during Christmas break. Follow up follow-up in 2 weeks for Taxol treatment. All questions were answered. The patient knows to call the clinic with any problems questions or concerns.  cc McLean-Scocuzza, Olam Idler, MD, PhD 11/08/2021

## 2021-11-08 NOTE — Patient Instructions (Signed)
[facility]   °Discharge Instructions: °Thank you for choosing [facility] to provide your oncology and hematology care.  °If you have a lab appointment with the Cancer Center, please go directly to the Cancer Center and check in at the registration area. ° °Wear comfortable clothing and clothing appropriate for easy access to any Portacath or PICC line.  ° °We strive to give you quality time with your provider. You may need to reschedule your appointment if you arrive late (15 or more minutes).  Arriving late affects you and other patients whose appointments are after yours.  Also, if you miss three or more appointments without notifying the office, you may be dismissed from the clinic at the provider’s discretion.    °  °For prescription refill requests, have your pharmacy contact our office and allow 72 hours for refills to be completed.   ° °Today you received the following chemotherapy and/or immunotherapy agents - Taxol    °  °To help prevent nausea and vomiting after your treatment, we encourage you to take your nausea medication as directed. ° °BELOW ARE SYMPTOMS THAT SHOULD BE REPORTED IMMEDIATELY: °*FEVER GREATER THAN 100.4 F (38 °C) OR HIGHER °*CHILLS OR SWEATING °*NAUSEA AND VOMITING THAT IS NOT CONTROLLED WITH YOUR NAUSEA MEDICATION °*UNUSUAL SHORTNESS OF BREATH °*UNUSUAL BRUISING OR BLEEDING °*URINARY PROBLEMS (pain or burning when urinating, or frequent urination) °*BOWEL PROBLEMS (unusual diarrhea, constipation, pain near the anus) °TENDERNESS IN MOUTH AND THROAT WITH OR WITHOUT PRESENCE OF ULCERS (sore throat, sores in mouth, or a toothache) °UNUSUAL RASH, SWELLING OR PAIN  °UNUSUAL VAGINAL DISCHARGE OR ITCHING  ° °Items with * indicate a potential emergency and should be followed up as soon as possible or go to the Emergency Department if any problems should occur. ° °Please show the CHEMOTHERAPY ALERT CARD or IMMUNOTHERAPY ALERT CARD at check-in to  the Emergency Department and triage nurse. ° °Should you have questions after your visit or need to cancel or reschedule your appointment, please contact [facility]  [phone] and follow the prompts.  Office hours are 8:00 a.m. to 4:30 p.m. Monday - Friday. Please note that voicemails left after 4:00 p.m. may not be returned until the following business day.  We are closed weekends and major holidays. You have access to a nurse at all times for urgent questions. Please call the main number to the clinic [phone] and follow the prompts. ° °For any non-urgent questions, you may also contact your provider using MyChart. We now offer e-Visits for anyone 18 and older to request care online for non-urgent symptoms. For details visit [email or web address]. °  °Also download the MyChart app! Go to the app store, search "MyChart", open the app, select [facility], and log in with your MyChart username and password. ° °Due to Covid, a mask is required upon entering the hospital/clinic. If you do not have a mask, one will be given to you upon arrival. For doctor visits, patients may have 1 support person aged 18 or older with them. For treatment visits, patients cannot have anyone with them due to current Covid guidelines and our immunocompromised population.  ° °Diphenhydramine Injection °What is this medication? °DIPHENHYDRAMINE (dye fen HYE dra meen) treats the symptoms of allergic reactions. It may also be used to prevent and treat motion sickness or the symptoms of Parkinson disease. It works by blocking histamine, a substance released by the body during an allergic reaction. It belongs to   a group of medications called antihistamines. °This medicine may be used for other purposes; ask your health care provider or pharmacist if you have questions. °COMMON BRAND NAME(S): Benadryl °What should I tell my care team before I take this medication? °They need to know if you have any of  these conditions: °Asthma or lung disease °Glaucoma °High blood pressure or heart disease °Liver disease °Pain or difficulty passing urine °Prostate trouble °Ulcers or other stomach problems °An unusual or allergic reaction to diphenhydramine, antihistamines, other medications foods, dyes, or preservatives °Pregnant or trying to get pregnant °Breast-feeding °How should I use this medication? °This medication is for injection into a vein or a muscle. It is usually given in a hospital or clinic. °If you get this medication at home, you will be taught how to prepare and give this medication. Use exactly as directed. Take your medication at regular intervals. Do not take your medication more often than directed. °It is important that you put your used needles and syringes in a special sharps container. Do not put them in a trash can. If you do not have a sharps container, call your pharmacist or care team to get one. °Talk to your care team regarding the use of this medication in children. While this medication may be prescribed for selected conditions, precautions do apply. This medication is not approved for use in newborns and premature babies. °Patients over 60 years old may have a stronger reaction and need a smaller dose. °Overdosage: If you think you have taken too much of this medicine contact a poison control center or emergency room at once. °NOTE: This medicine is only for you. Do not share this medicine with others. °What if I miss a dose? °If you miss a dose, take it as soon as you can. If it is almost time for your next dose, take only that dose. Do not take double or extra doses. °What may interact with this medication? °Do not take this medication with any of the following: °MAOIs like Carbex, Eldepryl, Marplan, Nardil, and Parnate °This medication may also interact with the following: °Alcohol °Barbiturates, like phenobarbital °Medications for bladder spasm like oxybutynin, tolterodine °Medications for  blood pressure °Medications for depression, anxiety, or psychotic disturbances °Medications for movement abnormalities or Parkinson disease °Medications for sleep °Other medications for cold, cough or allergy °Some medications for the stomach like chlordiazepoxide, dicyclomine °This list may not describe all possible interactions. Give your health care provider a list of all the medicines, herbs, non-prescription drugs, or dietary supplements you use. Also tell them if you smoke, drink alcohol, or use illegal drugs. Some items may interact with your medicine. °What should I watch for while using this medication? °Your condition will be monitored carefully while you are receiving this medication. Tell your care team if your symptoms do not start to get better or if they get worse. °You may get drowsy or dizzy. Do not drive, use machinery, or do anything that needs mental alertness until you know how this medication affects you. Do not stand or sit up quickly, especially if you are an older patient. This reduces the risk of dizzy or fainting spells. Alcohol may interfere with the effect of this medication. Avoid alcoholic drinks. °Your mouth may get dry. Chewing sugarless gum or sucking hard candy, and drinking plenty of water may help. Contact your care team if the problem does not go away or is severe. °What side effects may I notice from receiving this medication? °Side effects   that you should report to your care team as soon as possible: °Allergic reactions--skin rash, itching, hives, swelling of the face, lips, tongue, or throat °Sudden eye pain or change in vision such as blurry vision, seeing halos around lights, vision loss °Trouble passing urine °Side effects that usually do not require medical attention (report to your care team if they continue or are bothersome): °Constipation °Drowsiness °Dry mouth °Headache °Upset stomach °This list may not describe all possible side effects. Call your doctor for medical  advice about side effects. You may report side effects to FDA at 1-800-FDA-1088. °Where should I keep my medication? °Keep out of the reach of children and pets. °If you are using this medication at home, you will be instructed on how to store this medication. Throw away any unused medication after the expiration date on the label. °NOTE: This sheet is a summary. It may not cover all possible information. If you have questions about this medicine, talk to your doctor, pharmacist, or health care provider. °© 2022 [employer] Standard (2020-12-07 00:00:00) ° °Dexamethasone injection °What is this medication? °DEXAMETHASONE (dex a METH a sone) is a corticosteroid. It is used to treat inflammation of the skin, joints, lungs, and other organs. Common conditions treated include asthma, allergies, and arthritis. It is also used for other conditions, like blood disorders and diseases of the adrenal glands. °This medicine may be used for other purposes; ask your health care provider or pharmacist if you have questions. °COMMON BRAND NAME(S): Decadron, DoubleDex, ReadySharp Dexamethasone, Simplist Dexamethasone, Solurex °What should I tell my care team before I take this medication? °They need to know if you have any of these conditions: °Cushing's syndrome °diabetes °glaucoma °heart disease °high blood pressure °infection like herpes, measles, tuberculosis, or chickenpox °kidney disease °liver disease °mental illness °myasthenia gravis °osteoporosis °previous heart attack °seizures °stomach or intestine problems °thyroid disease °an unusual or allergic reaction to dexamethasone, corticosteroids, other medicines, lactose, foods, dyes, or preservatives °pregnant or trying to get pregnant °breast-feeding °How should I use this medication? °This medicine is for injection into a muscle, joint, lesion, soft tissue, or vein. It is given by a health care professional in a hospital or clinic setting. °Talk to your pediatrician  regarding the use of this medicine in children. Special care may be needed. °Overdosage: If you think you have taken too much of this medicine contact a poison control center or emergency room at once. °NOTE: This medicine is only for you. Do not share this medicine with others. °What if I miss a dose? °This may not apply. If you are having a series of injections over a prolonged period, try not to miss an appointment. Call your doctor or health care professional to reschedule if you are unable to keep an appointment. °What may interact with this medication? °Do not take this medicine with any of the following medications: °live virus vaccines °This medicine may also interact with the following medications: °aminoglutethimide °amphotericin B °aspirin and aspirin-like medicines °certain antibiotics like erythromycin, clarithromycin, and troleandomycin °certain antivirals for HIV or hepatitis °certain medicines for seizures like carbamazepine, phenobarbital, phenytoin °certain medicines to treat myasthenia gravis °cholestyramine °cyclosporine °digoxin °diuretics °ephedrine °female hormones, like estrogen or progestins and birth control pills °insulin or other medicines for diabetes °isoniazid °ketoconazole °medicines that relax muscles for surgery °mifepristone °NSAIDs, medicines for pain and inflammation, like ibuprofen or naproxen °rifampin °skin tests for allergies °thalidomide °vaccines °warfarin °This list may not describe all possible interactions. Give your health care provider   a list of all the medicines, herbs, non-prescription drugs, or dietary supplements you use. Also tell them if you smoke, drink alcohol, or use illegal drugs. Some items may interact with your medicine. °What should I watch for while using this medication? °Visit your health care professional for regular checks on your progress. Tell your health care professional if your symptoms do not start to get better or if they get worse. Your  condition will be monitored carefully while you are receiving this medicine. °Wear a medical ID bracelet or chain. Carry a card that describes your disease and details of your medicine and dosage times. °This medicine may increase your risk of getting an infection. Call your health care professional for advice if you get a fever, chills, or sore throat, or other symptoms of a cold or flu. Do not treat yourself. Try to avoid being around people who are sick. Call your health care professional if you are around anyone with measles, chickenpox, or if you develop sores or blisters that do not heal properly. °If you are going to need surgery or other procedures, tell your doctor or health care professional that you have taken this medicine within the last 12 months. °Ask your doctor or health care professional about your diet. You may need to lower the amount of salt you eat. °This medicine may increase blood sugar. Ask your healthcare provider if changes in diet or medicines are needed if you have diabetes. °What side effects may I notice from receiving this medication? °Side effects that you should report to your doctor or health care professional as soon as possible: °allergic reactions like skin rash, itching or hives, swelling of the face, lips, or tongue °bloody or black, tarry stools °changes in emotions or moods °changes in vision °confusion, excitement, restlessness °depressed mood °eye pain °hallucinations °muscle weakness °severe or sudden stomach or belly pain °signs and symptoms of high blood sugar such as being more thirsty or hungry or having to urinate more than normal. You may also feel very tired or have blurry vision. °signs and symptoms of infection like fever; chills; cough; sore throat; pain or trouble passing urine °swelling of ankles, feet °unusual bruising or bleeding °wounds that do not heal °Side effects that usually do not require medical attention (report to your doctor or health care  professional if they continue or are bothersome): °increased appetite °increased growth of face or body hair °headache °nausea, vomiting °pain, redness, or irritation at site where injected °skin problems, acne, thin and shiny skin °trouble sleeping °weight gain °This list may not describe all possible side effects. Call your doctor for medical advice about side effects. You may report side effects to FDA at 1-800-FDA-1088. °Where should I keep my medication? °This medicine is given in a hospital or clinic and will not be stored at home. °NOTE: This sheet is a summary. It may not cover all possible information. If you have questions about this medicine, talk to your doctor, pharmacist, or health care provider. °© 2022 [employer]/Gold Standard (2019-05-26 00:00:00) ° °Famotidine Solution for Injection °What is this medication? °FAMOTIDINE (fa MOE ti deen) treats stomach ulcers, reflux disease, or other conditions that cause too much stomach acid. It works by reducing the amount of acid in the stomach. °This medicine may be used for other purposes; ask your health care provider or pharmacist if you have questions. °COMMON BRAND NAME(S): Pepcid °What should I tell my care team before I take this medication? °They need to know if   you have any of these conditions: °Kidney or liver disease °An unusual or allergic reaction to famotidine, other medications, foods, dyes, or preservatives °Pregnant or trying to get pregnant °Breast-feeding °How should I use this medication? °This medication is for infusion into a vein. It is given in a hospital or clinic setting. °Talk to your care team regarding the use of this medication in children. Special care may be needed. °Overdosage: If you think you have taken too much of this medicine contact a poison control center or emergency room at once. °NOTE: This medicine is only for you. Do not share this medicine with others. °What if I miss a dose? °This does not apply. °What may interact  with this medication? °Delavirdine °Itraconazole °Ketoconazole °This list may not describe all possible interactions. Give your health care provider a list of all the medicines, herbs, non-prescription drugs, or dietary supplements you use. Also tell them if you smoke, drink alcohol, or use illegal drugs. Some items may interact with your medicine. °What should I watch for while using this medication? °Tell your doctor or health care professional if your condition does not start to get better or gets worse. °Do not take with aspirin, ibuprofen, or other antiinflammatory medications. These can aggravate your condition. °Do not smoke cigarettes or drink alcohol. These increase irritation in your stomach and can increase the time it will take for ulcers to heal. Cigarettes and alcohol can also worsen acid reflux or heartburn. °If you get black, tarry stools or vomit up what looks like coffee grounds, call your doctor or health care professional at once. You may have a bleeding ulcer. °This medication may cause a decrease in vitamin B12. Make sure that you get enough vitamin B12 while you are taking this medication. Discuss the foods you eat and the vitamins you take with your care team. °What side effects may I notice from receiving this medication? °Side effects that you should report to your care team as soon as possible: °Allergic reactions--skin rash, itching, hives, swelling of the face, lips, tongue, or throat °Confusion °Hallucinations °Side effects that usually do not require medical attention (report to your care team if they continue or are bothersome): °Constipation °Diarrhea °Dizziness °Headache °This list may not describe all possible side effects. Call your doctor for medical advice about side effects. You may report side effects to FDA at 1-800-FDA-1088. °Where should I keep my medication? °This medication is given in a hospital or clinic. You will not be given this medication to store at home. °NOTE:  This sheet is a summary. It may not cover all possible information. If you have questions about this medicine, talk to your doctor, pharmacist, or health care provider. °© 2022 [employer]/Gold Standard (2020-11-13 00:00:00) ° °Paclitaxel injection °What is this medication? °PACLITAXEL (PAK li TAX el) is a chemotherapy drug. It targets fast dividing cells, like cancer cells, and causes these cells to die. This medicine is used to treat ovarian cancer, breast cancer, lung cancer, Kaposi's sarcoma, and other cancers. °This medicine may be used for other purposes; ask your health care provider or pharmacist if you have questions. °COMMON BRAND NAME(S): Onxol, Taxol °What should I tell my care team before I take this medication? °They need to know if you have any of these conditions: °history of irregular heartbeat °liver disease °low blood counts, like low Evagelia Knack cell, platelet, or red cell counts °lung or breathing disease, like asthma °tingling of the fingers or toes, or other nerve disorder °an unusual or   allergic reaction to paclitaxel, alcohol, polyoxyethylated castor oil, other chemotherapy, other medicines, foods, dyes, or preservatives °pregnant or trying to get pregnant °breast-feeding °How should I use this medication? °This drug is given as an infusion into a vein. It is administered in a hospital or clinic by a specially trained health care professional. °Talk to your pediatrician regarding the use of this medicine in children. Special care may be needed. °Overdosage: If you think you have taken too much of this medicine contact a poison control center or emergency room at once. °NOTE: This medicine is only for you. Do not share this medicine with others. °What if I miss a dose? °It is important not to miss your dose. Call your doctor or health care professional if you are unable to keep an appointment. °What may interact with this medication? °Do not take this medicine with any of the following  medications: °live virus vaccines °This medicine may also interact with the following medications: °antiviral medicines for hepatitis, HIV or AIDS °certain antibiotics like erythromycin and clarithromycin °certain medicines for fungal infections like ketoconazole and itraconazole °certain medicines for seizures like carbamazepine, phenobarbital, phenytoin °gemfibrozil °nefazodone °rifampin °St. John's wort °This list may not describe all possible interactions. Give your health care provider a list of all the medicines, herbs, non-prescription drugs, or dietary supplements you use. Also tell them if you smoke, drink alcohol, or use illegal drugs. Some items may interact with your medicine. °What should I watch for while using this medication? °Your condition will be monitored carefully while you are receiving this medicine. You will need important blood work done while you are taking this medicine. °This medicine can cause serious allergic reactions. To reduce your risk you will need to take other medicine(s) before treatment with this medicine. If you experience allergic reactions like skin rash, itching or hives, swelling of the face, lips, or tongue, tell your doctor or health care professional right away. °In some cases, you may be given additional medicines to help with side effects. Follow all directions for their use. °This drug may make you feel generally unwell. This is not uncommon, as chemotherapy can affect healthy cells as well as cancer cells. Report any side effects. Continue your course of treatment even though you feel ill unless your doctor tells you to stop. °Call your doctor or health care professional for advice if you get a fever, chills or sore throat, or other symptoms of a cold or flu. Do not treat yourself. This drug decreases your body's ability to fight infections. Try to avoid being around people who are sick. °This medicine may increase your risk to bruise or bleed. Call your doctor or  health care professional if you notice any unusual bleeding. °Be careful brushing and flossing your teeth or using a toothpick because you may get an infection or bleed more easily. If you have any dental work done, tell your dentist you are receiving this medicine. °Avoid taking products that contain aspirin, acetaminophen, ibuprofen, naproxen, or ketoprofen unless instructed by your doctor. These medicines may hide a fever. °Do not become pregnant while taking this medicine. Women should inform their doctor if they wish to become pregnant or think they might be pregnant. There is a potential for serious side effects to an unborn child. Talk to your health care professional or pharmacist for more information. Do not breast-feed an infant while taking this medicine. °Men are advised not to father a child while receiving this medicine. °This product may contain alcohol.   Ask your pharmacist or healthcare provider if this medicine contains alcohol. Be sure to tell all healthcare providers you are taking this medicine. Certain medicines, like metronidazole and disulfiram, can cause an unpleasant reaction when taken with alcohol. The reaction includes flushing, headache, nausea, vomiting, sweating, and increased thirst. The reaction can last from 30 minutes to several hours. °What side effects may I notice from receiving this medication? °Side effects that you should report to your doctor or health care professional as soon as possible: °allergic reactions like skin rash, itching or hives, swelling of the face, lips, or tongue °breathing problems °changes in vision °fast, irregular heartbeat °high or low blood pressure °mouth sores °pain, tingling, numbness in the hands or feet °signs of decreased platelets or bleeding - bruising, pinpoint red spots on the skin, black, tarry stools, blood in the urine °signs of decreased red blood cells - unusually weak or tired, feeling faint or lightheaded, falls °signs of infection -  fever or chills, cough, sore throat, pain or difficulty passing urine °signs and symptoms of liver injury like dark yellow or brown urine; general ill feeling or flu-like symptoms; light-colored stools; loss of appetite; nausea; right upper belly pain; unusually weak or tired; yellowing of the eyes or skin °swelling of the ankles, feet, hands °unusually slow heartbeat °Side effects that usually do not require medical attention (report to your doctor or health care professional if they continue or are bothersome): °diarrhea °hair loss °loss of appetite °muscle or joint pain °nausea, vomiting °pain, redness, or irritation at site where injected °tiredness °This list may not describe all possible side effects. Call your doctor for medical advice about side effects. You may report side effects to FDA at 1-800-FDA-1088. °Where should I keep my medication? °This drug is given in a hospital or clinic and will not be stored at home. °NOTE: This sheet is a summary. It may not cover all possible information. If you have questions about this medicine, talk to your doctor, pharmacist, or health care provider. °© 2022 Elsevier/Gold Standard (2021-07-30 00:00:00) ° ° ° °

## 2021-11-08 NOTE — Progress Notes (Signed)
Pt here for follow up. No new concerns voiced.   

## 2021-11-08 NOTE — Progress Notes (Signed)
Pt received taxol infusion in clinic today. Tolerated well. No complaints at d/c. °

## 2021-11-21 ENCOUNTER — Encounter: Payer: Self-pay | Admitting: Pharmacist

## 2021-11-21 ENCOUNTER — Ambulatory Visit (INDEPENDENT_AMBULATORY_CARE_PROVIDER_SITE_OTHER): Payer: PPO | Admitting: Vascular Surgery

## 2021-11-21 ENCOUNTER — Telehealth: Payer: Self-pay | Admitting: Oncology

## 2021-11-21 ENCOUNTER — Other Ambulatory Visit (INDEPENDENT_AMBULATORY_CARE_PROVIDER_SITE_OTHER): Payer: Self-pay | Admitting: Nurse Practitioner

## 2021-11-21 ENCOUNTER — Encounter (INDEPENDENT_AMBULATORY_CARE_PROVIDER_SITE_OTHER): Payer: PPO

## 2021-11-21 DIAGNOSIS — I6523 Occlusion and stenosis of bilateral carotid arteries: Secondary | ICD-10-CM

## 2021-11-21 NOTE — Telephone Encounter (Signed)
Encounter opened in error

## 2021-11-21 NOTE — Telephone Encounter (Signed)
Pt called to reschedule her appt for 12-30. Having car problem. Call back at [phone]

## 2021-11-22 ENCOUNTER — Inpatient Hospital Stay: Payer: PPO | Admitting: Oncology

## 2021-11-22 ENCOUNTER — Telehealth: Payer: Self-pay | Admitting: Internal Medicine

## 2021-11-22 ENCOUNTER — Inpatient Hospital Stay: Payer: PPO

## 2021-11-22 NOTE — Telephone Encounter (Signed)
Copied from [place] [phone]. Topic: Medicare AWV >> Nov 22, 2021 10:50 AM Harris-Coley, Hannah Beat wrote: Reason for CRM: Left message for patient to schedule Annual Wellness Visit.  Please schedule with Nurse Health Advisor Denisa O'Brien-Blaney, LPN at [facility].  Please call [phone] ask for [facility]

## 2021-11-27 ENCOUNTER — Encounter: Payer: Self-pay | Admitting: Oncology

## 2021-11-27 ENCOUNTER — Other Ambulatory Visit: Payer: Self-pay

## 2021-11-27 ENCOUNTER — Other Ambulatory Visit (HOSPITAL_COMMUNITY): Payer: Self-pay

## 2021-11-27 ENCOUNTER — Inpatient Hospital Stay (HOSPITAL_BASED_OUTPATIENT_CLINIC_OR_DEPARTMENT_OTHER): Payer: PPO | Admitting: Oncology

## 2021-11-27 ENCOUNTER — Inpatient Hospital Stay: Payer: PPO | Attending: Oncology

## 2021-11-27 ENCOUNTER — Inpatient Hospital Stay: Payer: PPO

## 2021-11-27 VITALS — BP 110/74 | HR 82 | Temp 97.9°F | Wt 126.6 lb

## 2021-11-27 DIAGNOSIS — Z170 Estrogen receptor positive status [ER+]: Secondary | ICD-10-CM | POA: Diagnosis not present

## 2021-11-27 DIAGNOSIS — I2510 Atherosclerotic heart disease of native coronary artery without angina pectoris: Secondary | ICD-10-CM | POA: Insufficient documentation

## 2021-11-27 DIAGNOSIS — I10 Essential (primary) hypertension: Secondary | ICD-10-CM | POA: Insufficient documentation

## 2021-11-27 DIAGNOSIS — Z87891 Personal history of nicotine dependence: Secondary | ICD-10-CM | POA: Insufficient documentation

## 2021-11-27 DIAGNOSIS — Z7982 Long term (current) use of aspirin: Secondary | ICD-10-CM | POA: Diagnosis not present

## 2021-11-27 DIAGNOSIS — Z79899 Other long term (current) drug therapy: Secondary | ICD-10-CM | POA: Insufficient documentation

## 2021-11-27 DIAGNOSIS — C50912 Malignant neoplasm of unspecified site of left female breast: Secondary | ICD-10-CM

## 2021-11-27 DIAGNOSIS — Z5111 Encounter for antineoplastic chemotherapy: Secondary | ICD-10-CM | POA: Diagnosis not present

## 2021-11-27 DIAGNOSIS — C50812 Malignant neoplasm of overlapping sites of left female breast: Secondary | ICD-10-CM | POA: Diagnosis not present

## 2021-11-27 DIAGNOSIS — T451X5A Adverse effect of antineoplastic and immunosuppressive drugs, initial encounter: Secondary | ICD-10-CM | POA: Diagnosis not present

## 2021-11-27 DIAGNOSIS — Z7984 Long term (current) use of oral hypoglycemic drugs: Secondary | ICD-10-CM | POA: Insufficient documentation

## 2021-11-27 DIAGNOSIS — R634 Abnormal weight loss: Secondary | ICD-10-CM | POA: Insufficient documentation

## 2021-11-27 DIAGNOSIS — Z7952 Long term (current) use of systemic steroids: Secondary | ICD-10-CM | POA: Insufficient documentation

## 2021-11-27 DIAGNOSIS — E119 Type 2 diabetes mellitus without complications: Secondary | ICD-10-CM | POA: Diagnosis not present

## 2021-11-27 DIAGNOSIS — E876 Hypokalemia: Secondary | ICD-10-CM

## 2021-11-27 DIAGNOSIS — D6481 Anemia due to antineoplastic chemotherapy: Secondary | ICD-10-CM | POA: Diagnosis not present

## 2021-11-27 DIAGNOSIS — Z8673 Personal history of transient ischemic attack (TIA), and cerebral infarction without residual deficits: Secondary | ICD-10-CM | POA: Insufficient documentation

## 2021-11-27 LAB — CBC WITH DIFFERENTIAL/PLATELET
Abs Immature Granulocytes: 0.12 10*3/uL — ABNORMAL HIGH (ref 0.00–0.07)
Basophils Absolute: 0 10*3/uL (ref 0.0–0.1)
Basophils Relative: 1 %
Eosinophils Absolute: 0.1 10*3/uL (ref 0.0–0.5)
Eosinophils Relative: 2 %
HCT: 34.8 % — ABNORMAL LOW (ref 36.0–46.0)
Hemoglobin: 11 g/dL — ABNORMAL LOW (ref 12.0–15.0)
Immature Granulocytes: 3 %
Lymphocytes Relative: 28 %
Lymphs Abs: 1.3 10*3/uL (ref 0.7–4.0)
MCH: 29.7 pg (ref 26.0–34.0)
MCHC: 31.6 g/dL (ref 30.0–36.0)
MCV: 94.1 fL (ref 80.0–100.0)
Monocytes Absolute: 0.8 10*3/uL (ref 0.1–1.0)
Monocytes Relative: 19 %
Neutro Abs: 2.2 10*3/uL (ref 1.7–7.7)
Neutrophils Relative %: 47 %
Platelets: 212 10*3/uL (ref 150–400)
RBC: 3.7 MIL/uL — ABNORMAL LOW (ref 3.87–5.11)
RDW: 14.5 % (ref 11.5–15.5)
WBC: 4.5 10*3/uL (ref 4.0–10.5)
nRBC: 0 % (ref 0.0–0.2)

## 2021-11-27 LAB — COMPREHENSIVE METABOLIC PANEL
ALT: 21 U/L (ref 0–44)
AST: 22 U/L (ref 15–41)
Albumin: 3.5 g/dL (ref 3.5–5.0)
Alkaline Phosphatase: 75 U/L (ref 38–126)
Anion gap: 8 (ref 5–15)
BUN: 17 mg/dL (ref 8–23)
CO2: 21 mmol/L — ABNORMAL LOW (ref 22–32)
Calcium: 10 mg/dL (ref 8.9–10.3)
Chloride: 107 mmol/L (ref 98–111)
Creatinine, Ser: 0.9 mg/dL (ref 0.44–1.00)
GFR, Estimated: >60 mL/min (ref >60)
Glucose, Bld: 143 mg/dL — ABNORMAL HIGH (ref 70–99)
Potassium: 4.3 mmol/L (ref 3.5–5.1)
Sodium: 136 mmol/L (ref 135–145)
Total Bilirubin: 0.3 mg/dL (ref 0.3–1.2)
Total Protein: 6.8 g/dL (ref 6.5–8.1)

## 2021-11-27 MED ORDER — DIPHENHYDRAMINE HCL 50 MG/ML IJ SOLN
50.0000 mg | Freq: Once | INTRAMUSCULAR | Status: AC
  Administered 2021-11-27: 50 mg via INTRAVENOUS
  Filled 2021-11-27: qty 1

## 2021-11-27 MED ORDER — FAMOTIDINE IN NACL 20-0.9 MG/50ML-% IV SOLN
20.0000 mg | Freq: Once | INTRAVENOUS | Status: AC
  Administered 2021-11-27: 20 mg via INTRAVENOUS
  Filled 2021-11-27: qty 50

## 2021-11-27 MED ORDER — HEPARIN SOD (PORK) LOCK FLUSH 100 UNIT/ML IV SOLN
500.0000 [IU] | Freq: Once | INTRAVENOUS | Status: AC | PRN
  Administered 2021-11-27: 500 [IU]
  Filled 2021-11-27: qty 5

## 2021-11-27 MED ORDER — DEXAMETHASONE 2 MG PO TABS: 2.0000 mg | ORAL_TABLET | Freq: Every day | ORAL | 0 refills | Status: DC

## 2021-11-27 MED ORDER — SODIUM CHLORIDE 0.9 % IV SOLN
65.0000 mg/m2 | Freq: Once | INTRAVENOUS | Status: AC
  Administered 2021-11-27: 102 mg via INTRAVENOUS
  Filled 2021-11-27: qty 17

## 2021-11-27 MED ORDER — SODIUM CHLORIDE 0.9 % IV SOLN
10.0000 mg | Freq: Once | INTRAVENOUS | Status: AC
  Administered 2021-11-27: 10 mg via INTRAVENOUS
  Filled 2021-11-27: qty 10

## 2021-11-27 MED ORDER — SODIUM CHLORIDE 0.9 % IV SOLN
Freq: Once | INTRAVENOUS | Status: AC
  Filled 2021-11-27: qty 250

## 2021-11-27 NOTE — Progress Notes (Signed)
Hematology/Oncology Progress note [phone]    Patient Care Team: McLean-Scocuzza, Nino Glow, MD as PCP - General (Internal Medicine) De Hollingshead, Hatley as Pharmacist (Pharmacist) Theodore Demark, RN as Oncology Nurse Navigator  REFERRING PROVIDER: McLean-Scocuzza, Olivia Mackie *  CHIEF COMPLAINTS/REASON FOR VISIT:  multifocal left breast cancer  HISTORY OF PRESENTING ILLNESS:   Tammy Nunez is a  71 y.o.  female with PMH listed below was seen in consultation at the request of  McLean-Scocuzza, Olivia Mackie *  for evaluation of multifocal left breast cancer.   04/24/2021, screening mammogram showed large asymmetry in the superior aspect of the breast with associated calcifications and possible masses and a subtle distortions.  No suspicious findings for malignancy right breast.  05/02/2021 left breast diagnostic mammogram and ultrasound showed multiple irregular mass in the left breast. Mammogram mass 1 irregular mass associated with distortion in the upper breast at middle depth, multiple adjacent and possible continuous irregular masses, conglomeration of masses measures at least 5.6 cm.  Associated pleomorphic calcifications extending at least 5 x 3.9 x 3.7 cm Mass 2, 1 cm mass in the upper breast at the middle to posterior depth. Mass 3, 5 mm irregular mass in the upper breast at posterior depth. In total, mass 1-mass 3 span at least 8.2 cm in anterior-posterior extent.  By ultrasound  mass 1 12:00 5 cm from nipple, 2.7 x 1.5 x 4.3 cm Mass 2, 12:00 12 cm from nipple, 0.9 x 0.9 x 0.5 cm Mass 3   12:00 14 cm from nipple, 0.4 x 0.4 x 0.4 cm-uses 2.4 cm superior to mass to Mass 4   11:00  No suspicious left axillary adenopathy.   05/07/2021 -Ultrasound-guided breast biopsy Breast left 12:00 mass 5 cm from nipple biopsy showed invasive mammary carcinoma, no special type, grade 3, high-grade DCIS present, no LVI, ER 90%, PR 11-50%, HER2 negative by IHC Breast left 11:00  mass 10 cm from nipple biopsy showed invasive mammary carcinoma, no special type, grade 2, no DCIS/LVI identified.ER 90%, PR 11-50%, HER2 negative by IHC  Patient is married has no children.  Denies any hormone replacement, oral contraceptive use, family history of breast cancer.  Denies any chest radiation. Patient has a history of stroke with chronic residual weakness of the left side.  She walks with a cane.  05/22/2021 MRI breast showed that nonmass like malignancy spanning over 8 cm, abuting anterior aspect of pectoralis muscle. Discussed with Dr.Cintron and we agree that she will need mastectomy. There is concern of possible positive margin. I recommend neoadjuvant chemotherapy  # medi port placed on 06/06/2021. 06/13/2021 pre chemotherapy Echo. LVEF 60-65%.  Normal global longitudinal strain. Grade 1 diastolic dysfunction.   # 05/22/2021 MRI breast showed  Biopsy-proven malignancy within the UPPER LEFT breast spanning a distance of 8.3 x 4.2 x 4 cm nonmass like enhancement abuts the anterior aspect of the LEFT pectoralis muscle without gross invasion. Oncotype Dx 27. Discussed with Dr.Cintron, there is concern of positive margin.   #Chemotherapy 06/21/2021 - 08/16/2021 ddAC x4 with G-CSF support. 08/30/2021 - 09/06/2021, weekly Taxol. Chemotherapy break due to side effects, UTI and the patient's preference. 10/25/2021  INTERVAL HISTORY Tammy Nunez is a 71 y.o. female who has above history reviewed by me today presents for follow up visit for chemotherapy of breast cancer Patient was accompanied by her husband. Appetite is not good, she eats small portion of meals.  Car broke down last week and she missed treatments.  Lost weight, 3 pounds  since 2 weeks ago. No nausea vomiting. Diarrhea.    Review of Systems  Constitutional:  Positive for fatigue. Negative for appetite change, chills and fever.  HENT:   Negative for hearing loss and voice change.   Eyes:  Negative for eye problems.   Respiratory:  Negative for chest tightness and cough.   Cardiovascular:  Negative for chest pain.  Gastrointestinal:  Negative for abdominal distention, abdominal pain, blood in stool, diarrhea and nausea.  Endocrine: Negative for hot flashes.  Genitourinary:  Negative for difficulty urinating, dysuria and frequency.   Musculoskeletal:  Negative for arthralgias.  Skin:  Negative for itching and rash.  Neurological:  Negative for extremity weakness.  Hematological:  Negative for adenopathy. Does not bruise/bleed easily.  Psychiatric/Behavioral:  Negative for confusion.    MEDICAL HISTORY:  Past Medical History:  Diagnosis Date   Breast cancer in female [facility]) 05/08/2021   Chronic left shoulder pain    Coronary artery disease    Diabetes mellitus without complication ([place])    Dysrhythmia    Hypertension    Hypomagnesemia 09/13/2021   Paralysis ([place])    weakness left upper amd lower extremity   PONV (postoperative nausea and vomiting)    Stroke [facility])     SURGICAL HISTORY: Past Surgical History:  Procedure Laterality Date   BREAST BIOPSY Left 05/07/2021   12:00 5cmfn vision clip path pending   BREAST BIOPSY Left 05/07/2021   11:00 10 cmfn mini cork clip path pending   CAROTID PTA/STENT INTERVENTION Left 07/20/2019   Procedure: CAROTID PTA/STENT INTERVENTION;  Surgeon: Katha Cabal, MD;  Location: [place] [facility];  Service: Cardiovascular;  Laterality: Left;   ECTOPIC PREGNANCY SURGERY     PORTACATH PLACEMENT N/A 06/06/2021   Procedure: INSERTION PORT-A-CATH;  Surgeon: Herbert Pun, MD;  Location: [facility] ORS;  Service: General;  Laterality: N/A;    SOCIAL HISTORY: Social History   Socioeconomic History   Marital status: Married    Spouse name: Not on file   Number of children: Not on file   Years of education: Not on file   Highest education level: Not on file  Occupational History   Not on file  Tobacco Use   Smoking status: Former    Packs/day:  0.50    Years: 15.00    Pack years: 7.50    Types: Cigarettes    Quit date: 04/11/2019    Years since quitting: 2.6   Smokeless tobacco: Never  Vaping Use   Vaping Use: Never used  Substance and Sexual Activity   Alcohol use: Never   Drug use: Never   Sexual activity: Not Currently  Other Topics Concern   Not on file  Social History Narrative   No kids    Married with husband    Used to work [employer] broadcast assistant    Social Determinants of Radio broadcast assistant Strain: Low Risk    Difficulty of Paying Living Expenses: Not hard at all  Food Insecurity: Not on file  Transportation Needs: Not on file  Physical Activity: Not on file  Stress: Not on file  Social Connections: Not on file  Intimate Partner Violence: Not on file    FAMILY HISTORY: Family History  Problem Relation Age of Onset   Diabetes type II Mother    Hypertension Father    Heart attack Father    Diabetes type II Brother    Breast cancer Neg Hx     ALLERGIES:  has No Known  Allergies.  MEDICATIONS:  Current Outpatient Medications  Medication Sig Dispense Refill   amLODipine (NORVASC) 10 MG tablet Take 1 tablet (10 mg total) by mouth daily. 90 tablet 3   aspirin 81 MG EC tablet Take 1 tablet (81 mg total) by mouth daily. 90 tablet 3   atorvastatin (LIPITOR) 80 MG tablet Take 1 tablet (80 mg total) by mouth daily. 90 tablet 3   blood glucose meter kit and supplies 1 each by Other route 4 (four) times daily. Dispense based on patient and insurance preference. Four times daily as directed. (FOR ICD-10 E10.9, E11.9). 1 each 0   carvedilol (COREG) 25 MG tablet Take 1 tablet (25 mg total) by mouth 2 (two) times daily with a meal. 180 tablet 3   clopidogrel (PLAVIX) 75 MG tablet Take 1 tablet (75 mg total) by mouth daily. Further refills [facility] neurology 90 tablet 3   Continuous Blood Gluc Sensor (FREESTYLE LIBRE 2 SENSOR) MISC USE TO CHECK GLUCOSE AT LEAST EVERY 8 HOURS 6 each 0   dexamethasone  (DECADRON) 2 MG tablet Take 1 tablet (2 mg total) by mouth daily. 4 tablet 0   empagliflozin (JARDIANCE) 25 MG TABS tablet Take 1 tablet (25 mg total) by mouth daily. 90 tablet 3   lidocaine-prilocaine (EMLA) cream Apply to affected area once 30 g 3   lisinopril (ZESTRIL) 40 MG tablet Take 1 tablet (40 mg total) by mouth daily. 90 tablet 3   loperamide (IMODIUM) 2 MG capsule Take 1 capsule (2 mg total) by mouth See admin instructions. Take 69m at the onset of diarrhea, and then 224mafter each loose bowel movement, maximum 1654mer 24 hours. 30 capsule 1   loratadine (CLARITIN) 10 MG tablet Take 1 tablet (10 mg total) by mouth daily. 90 tablet 3   magnesium chloride (SLOW-MAG) 64 MG TBEC SR tablet Take 1 tablet (64 mg total) by mouth daily. 30 tablet 1   Multiple Vitamin (MULTIVITAMIN) tablet Take 1 tablet by mouth daily.     ondansetron (ZOFRAN) 8 MG tablet Take 1 tablet (8 mg total) by mouth 2 (two) times daily as needed. Start on the third day after chemotherapy. 30 tablet 1   potassium chloride SA (KLOR-CON M20) 20 MEQ tablet Take 1 tablet (20 mEq total) by mouth daily. 90 tablet 3   prochlorperazine (COMPAZINE) 10 MG tablet Take 1 tablet (10 mg total) by mouth every 6 (six) hours as needed (Nausea or vomiting). 30 tablet 1   vitamin B-12 (CYANOCOBALAMIN) 1000 MCG tablet Take 1 tablet (1,000 mcg total) by mouth daily. 30 tablet 1   Vitamin D, Cholecalciferol, 50 MCG (2000 UT) CAPS Take 2,000 Units by mouth.     No current facility-administered medications for this visit.   Facility-Administered Medications Ordered in Other Visits  Medication Dose Route Frequency Provider Last Rate Last Admin   heparin lock flush 100 UNIT/ML injection              PHYSICAL EXAMINATION: ECOG PERFORMANCE STATUS: 1 - Symptomatic but completely ambulatory Vitals:   11/27/21 0900  BP: 110/74  Pulse: 82  Temp: 97.9 F (36.6 C)   Filed Weights   11/27/21 0900  Weight: 126 lb 9.6 oz (57.4 kg)     Physical Exam Constitutional:      General: She is not in acute distress. HENT:     Head: Normocephalic and atraumatic.  Eyes:     General: No scleral icterus. Cardiovascular:     Rate and Rhythm: Normal  rate and regular rhythm.     Heart sounds: Normal heart sounds.  Pulmonary:     Effort: Pulmonary effort is normal. No respiratory distress.     Breath sounds: No wheezing.  Abdominal:     General: Bowel sounds are normal. There is no distension.     Palpations: Abdomen is soft.  Musculoskeletal:        General: No deformity. Normal range of motion.     Cervical back: Normal range of motion and neck supple.  Skin:    General: Skin is warm and dry.     Findings: No erythema or rash.  Neurological:     Mental Status: She is alert and oriented to person, place, and time. Mental status is at baseline.     Comments: Chronic left upper and lower extremity weakness.  Psychiatric:        Mood and Affect: Mood normal.  Left breast mass size has decreased.     LABORATORY DATA:  I have reviewed the data as listed Lab Results  Component Value Date   WBC 4.5 11/27/2021   HGB 11.0 (L) 11/27/2021   HCT 34.8 (L) 11/27/2021   MCV 94.1 11/27/2021   PLT 212 11/27/2021   Recent Labs    11/01/21 0950 11/08/21 0841 11/27/21 0817  NA 138 135 136  K 3.7 4.0 4.3  CL 105 105 107  CO2 23 19* 21*  GLUCOSE 171* 157* 143*  BUN _0 CREATININE 0.75 0.86 0.90  CALCIUM 9.4 9.9 10.0  GFRNONAA >60 >60 >60  PROT 6.5 6.9 6.8  ALBUMIN 3.7 3.8 3.5  AST _1 ALT 33 18 21  ALKPHOS 56 66 75  BILITOT 0.5 0.6 0.3    Iron/TIBC/Ferritin/ %Sat    Component Value Date/Time   IRON 113 12/08/2019 1015   TIBC 317 12/08/2019 1015   FERRITIN 67 12/08/2019 1015   IRONPCTSAT 36 12/08/2019 1015       RADIOGRAPHIC STUDIES: I have personally reviewed the radiological images as listed and agreed with the findings in the report. No results found.     ASSESSMENT & PLAN:  1.  Malignant neoplasm of left breast in female, estrogen receptor positive, unspecified site of breast ([place])   2. Encounter for antineoplastic chemotherapy   3. Hypokalemia   4. Hypomagnesemia   5. Weight loss    Cancer Staging  Invasive carcinoma of breast ([place]) Staging form: Breast, AJCC 8th Edition - Clinical stage from 05/16/2021: Stage IIB (cT3, cN0, cM0, G3, ER+, PR+, HER2-) - Signed by [name], MD on 06/03/2021  #Left breast invasive carcinoma, multiple sites.  ER 90%, PR 11-50% positive, HER2 negative by IHC cT3 N0 On neoadjuvant chemotherapy, finished 4 cycles of ddAC Interval MRI showed stable size. On weekly Taxol.  Labs are reviewed and discussed with patient. Proceed with Taxol 53m/m2 today.   #Weight loss, recommend nutrition supplementation., follow up with dietitian.  #Hypomagnesia, magnesium level has improved.  Continue Slow-Mag 1 tablet daily. #Chemotherapy-induced anemia, hemoglobin is stable at 11.0. #Chemotherapy-induced neutropenia, ANC 2.2. improved.  #Hypokalemia, continue potassium chloride to 20-meq twice daily.  Potassium is stable.  Follow up follow-up in 1 week for Taxol treatment. All questions were answered. The patient knows to call the clinic with any problems questions or concerns.  cc McLean-Scocuzza, TOlam Idler MD, PhD 11/27/2021

## 2021-11-27 NOTE — Patient Instructions (Addendum)
[facility]  Discharge Instructions: Thank you for choosing [facility] to provide your oncology and hematology care.  If you have a lab appointment with the Cancer Center, please go directly to the Cancer Center and check in at the registration area.  Wear comfortable clothing and clothing appropriate for easy access to any Portacath or PICC line.   We strive to give you quality time with your provider. You may need to reschedule your appointment if you arrive late (15 or more minutes).  Arriving late affects you and other patients whose appointments are after yours.  Also, if you miss three or more appointments without notifying the office, you may be dismissed from the clinic at the provider's discretion.      For prescription refill requests, have your pharmacy contact our office and allow 72 hours for refills to be completed.    Today you received the following chemotherapy and/or immunotherapy agents Taxol      To help prevent nausea and vomiting after your treatment, we encourage you to take your nausea medication as directed.  BELOW ARE SYMPTOMS THAT SHOULD BE REPORTED IMMEDIATELY: *FEVER GREATER THAN 100.4 F (38 C) OR HIGHER *CHILLS OR SWEATING *NAUSEA AND VOMITING THAT IS NOT CONTROLLED WITH YOUR NAUSEA MEDICATION *UNUSUAL SHORTNESS OF BREATH *UNUSUAL BRUISING OR BLEEDING *URINARY PROBLEMS (pain or burning when urinating, or frequent urination) *BOWEL PROBLEMS (unusual diarrhea, constipation, pain near the anus) TENDERNESS IN MOUTH AND THROAT WITH OR WITHOUT PRESENCE OF ULCERS (sore throat, sores in mouth, or a toothache) UNUSUAL RASH, SWELLING OR PAIN  UNUSUAL VAGINAL DISCHARGE OR ITCHING   Items with * indicate a potential emergency and should be followed up as soon as possible or go to the Emergency Department if any problems should occur.  Please show the CHEMOTHERAPY ALERT CARD or IMMUNOTHERAPY ALERT CARD at check-in to the  Emergency Department and triage nurse.  Should you have questions after your visit or need to cancel or reschedule your appointment, please contact [facility]  [phone] and follow the prompts.  Office hours are 8:00 a.m. to 4:30 p.m. Monday - Friday. Please note that voicemails left after 4:00 p.m. may not be returned until the following business day.  We are closed weekends and major holidays. You have access to a nurse at all times for urgent questions. Please call the main number to the clinic [phone] and follow the prompts.  For any non-urgent questions, you may also contact your provider using MyChart. We now offer e-Visits for anyone 18 and older to request care online for non-urgent symptoms. For details visit [email or web address].   Also download the MyChart app! Go to the app store, search "MyChart", open the app, select [facility], and log in with your MyChart username and password.  Due to Covid, a mask is required upon entering the hospital/clinic. If you do not have a mask, one will be given to you upon arrival. For doctor visits, patients may have 1 support person aged 18 or older with them. For treatment visits, patients cannot have anyone with them due to current Covid guidelines and our immunocompromised population.  

## 2021-11-28 ENCOUNTER — Ambulatory Visit (INDEPENDENT_AMBULATORY_CARE_PROVIDER_SITE_OTHER): Payer: PPO | Admitting: Pharmacist

## 2021-11-28 DIAGNOSIS — E1165 Type 2 diabetes mellitus with hyperglycemia: Secondary | ICD-10-CM

## 2021-11-28 DIAGNOSIS — I10 Essential (primary) hypertension: Secondary | ICD-10-CM

## 2021-11-28 DIAGNOSIS — E785 Hyperlipidemia, unspecified: Secondary | ICD-10-CM

## 2021-11-28 NOTE — Patient Instructions (Signed)
Adaly,   It was great talking to you today!  Keep up the great work. If you have a day that you are dehydrated (significant diarrhea, nausea, vomiting, such that you cannot keep down fluids) please hold the Jardiance.   Let us know if you need anything!  Catie [name], PharmD  Visit Information  Following are the goals we discussed today:    Patient Goals/Self-Care Activities Over the next 90 days, patient will:  - take medications as prescribed check blood glucose three times daily using CGM, document, and provide at future appointments        Plan: Telephone follow up appointment with care management team member scheduled for:  8 weeks    Catie [name], PharmD, [place], CPP Clinical Pharmacist [place] [facility] [phone]   Please call the care guide team at [phone] if you need to cancel or reschedule your appointment.   Patient verbalizes understanding of instructions provided today and agrees to view in [place].

## 2021-11-28 NOTE — Chronic Care Management (AMB) (Signed)
Chronic Care Management CCM Pharmacy Note  11/28/2021 Name:  Tammy Nunez MRN:  [id number] DOB:  1951-07-06  Summary: - Tolerating current regimen  Recommendations/Changes made from today's visit: - Recommended to continue current regimen at this time  Subjective: Tammy Nunez is an 71 y.o. year old female who is a primary patient of McLean-Scocuzza, Nino Glow, MD.  The CCM team was consulted for assistance with disease management and care coordination needs.    Engaged with patient by telephone for follow up visit for pharmacy case management and/or care coordination services.   Objective:  Medications Reviewed Today     Reviewed by De Hollingshead, RPH-CPP (Pharmacist) on 11/28/21 at 1024  Med List Status: <None>   Medication Order Taking? Sig Documenting Provider Last Dose Status Informant  amLODipine (NORVASC) 10 MG tablet [id number] Yes Take 1 tablet (10 mg total) by mouth daily. McLean-Scocuzza, Nino Glow, MD Taking Active   aspirin 81 MG EC tablet [id number] Yes Take 1 tablet (81 mg total) by mouth daily. McLean-Scocuzza, Nino Glow, MD Taking Active   atorvastatin (LIPITOR) 80 MG tablet [id number] Yes Take 1 tablet (80 mg total) by mouth daily. McLean-Scocuzza, Nino Glow, MD Taking Active   blood glucose meter kit and supplies [id number]  1 each by Other route 4 (four) times daily. Dispense based on patient and insurance preference. Four times daily as directed. (FOR ICD-10 E10.9, E11.9). [name], FNP  Active Spouse/Significant Other  carvedilol (COREG) 25 MG tablet [id number] Yes Take 1 tablet (25 mg total) by mouth 2 (two) times daily with a meal. McLean-Scocuzza, Nino Glow, MD Taking Active Spouse/Significant Other  clopidogrel (PLAVIX) 75 MG tablet [id number] Yes Take 1 tablet (75 mg total) by mouth daily. Further refills [facility] neurology McLean-Scocuzza, Nino Glow, MD Taking Active   Continuous Blood Gluc Sensor (FREESTYLE LIBRE 2 SENSOR) MISC [id number] Yes USE TO CHECK GLUCOSE  AT LEAST EVERY 8 HOURS McLean-Scocuzza, Nino Glow, MD Taking Active   dexamethasone (DECADRON) 2 MG tablet [id number] Yes Take 1 tablet (2 mg total) by mouth daily. [name], MD Taking Active   empagliflozin (JARDIANCE) 25 MG TABS tablet [id number] Yes Take 1 tablet (25 mg total) by mouth daily. McLean-Scocuzza, Nino Glow, MD Taking Active   lidocaine-prilocaine (EMLA) cream [id number]  Apply to affected area once [name], MD  Active   lisinopril (ZESTRIL) 40 MG tablet [id number] Yes Take 1 tablet (40 mg total) by mouth daily. McLean-Scocuzza, Nino Glow, MD Taking Active   loperamide (IMODIUM) 2 MG capsule [id number] Yes Take 1 capsule (2 mg total) by mouth See admin instructions. Take 69m at the onset of diarrhea, and then 226mafter each loose bowel movement, maximum 1642mer 24 hours. Yu,Earlie ServerD Taking Active   loratadine (CLARITIN) 10 MG tablet [id number] Take 1 tablet (10 mg total) by mouth daily.  Patient not taking: Reported on 11/28/2021   McLean-Scocuzza, TraNino GlowD Not Taking Active   magnesium chloride (SLOW-MAG) 64 MG TBEC SR tablet [id number] Take 1 tablet (64 mg total) by mouth daily. Yu,Earlie ServerD Taking Active   Multiple Vitamin (MULTIVITAMIN) tablet [id number] Take 1 tablet by mouth daily. [provider] Taking Active   ondansetron (ZOFRAN) 8 MG tablet [id number] Take 1 tablet (8 mg total) by mouth 2 (two) times daily as needed. Start on the third day after chemotherapy. Yu,Earlie ServerD Taking Active   potassium chloride SA (KLOR-CON M20) 20 MEQ tablet [id number] Take 1 tablet (20  mEq total) by mouth daily. McLean-Scocuzza, Nino Glow, MD Taking Active            Med Note [name], Maralyn Sago Nov 28, 2021 10:23 AM) Taking BID per Dr. Tasia Catchings  prochlorperazine (COMPAZINE) 10 MG tablet [id number] Yes Take 1 tablet (10 mg total) by mouth every 6 (six) hours as needed (Nausea or vomiting). [name], MD Taking Active   vitamin B-12 (CYANOCOBALAMIN) 1000 MCG tablet [id number] Yes Take  1 tablet (1,000 mcg total) by mouth daily. Bary Leriche, PA-C Taking Active Spouse/Significant Other  Vitamin D, Cholecalciferol, 50 MCG (2000 UT) CAPS [id number] Yes Take 2,000 Units by mouth. [provider] Taking Active Spouse/Significant Other            Pertinent Labs:   Lab Results  Component Value Date   HGBA1C 5.5 10/09/2021   Lab Results  Component Value Date   CHOL 143 10/09/2021   HDL 44.10 10/09/2021   LDLCALC 76 10/09/2021   TRIG 115.0 10/09/2021   CHOLHDL 3 10/09/2021   Lab Results  Component Value Date   CREATININE 0.90 11/27/2021   BUN 17 11/27/2021   NA 136 11/27/2021   K 4.3 11/27/2021   CL 107 11/27/2021   CO2 21 (L) 11/27/2021    SDOH:  (Social Determinants of Health) assessments and interventions performed:  SDOH Interventions    Flowsheet Row Most Recent Value  SDOH Interventions   Financial Strain Interventions Intervention Not Indicated       CCM Care Plan  Review of patient past medical history, allergies, medications, health status, including review of consultants reports, laboratory and other test data, was performed as part of comprehensive evaluation and provision of chronic care management services.   Care Plan : Medication Management  Updates made by De Hollingshead, RPH-CPP since 11/28/2021 12:00 AM     Problem: Diabetes, Hx CVA, HTN, HLD      Long-Range Goal: Disease State Management   Recent Progress: On track  Priority: High  Note:   Current Barriers:  Unable to independently monitor therapeutic efficacy Unable to achieve control of cardiovascular risk reduction   Pharmacist Clinical Goal(s):  Over the next 90 days, patient will achieve adherence to monitoring guidelines and medication adherence to achieve therapeutic efficacy. Over the next 90 days, achieve control of diabetes as evidenced by improvement in A1c through collaboration with PharmD and provider.   Interventions: 1:1 collaboration with  McLean-Scocuzza, Nino Glow, MD regarding development and update of comprehensive plan of care as evidenced by provider attestation and co-signature Inter-disciplinary care team collaboration (see longitudinal plan of care) Comprehensive medication review performed; medication list updated in electronic medical record  Health Maintenance   Yearly diabetic eye exam: up to date Yearly diabetic foot exam: up to date Urine microalbumin: up to date Yearly influenza vaccination: due - recommended to discuss w/ Dr. Tasia Catchings regarding timing following chemotherapy Td/Tdap vaccination: due -  recommended to discuss w/ Dr. Tasia Catchings regarding timing following chemotherapy Pneumonia vaccination: due PCV 20 - recommended to discuss w/ Dr. Tasia Catchings regarding timing following chemotherapy  COVID vaccinations: due bivalent booster, recommended to discus w/ Dr. Tasia Catchings regarding timing Shingrix vaccinations: due - recommend to discuss w/ Dr. Tasia Catchings regarding timing following chemotherapy Colonoscopy: due - deferred Bone density scan: up to date Mammogram: up to date  Diabetes: Controlled; current treatment: Jardiance 25 mg daily Reports a UTI several weeks ago, but otherwise no reports of genitourinary infection symptoms Stopped metformin to reduce pill  burden, lack of need due to last A1c.  Current glucose readings: utilizing Libre 2 CGM; her reader is upstairs and she cannot get to right now. Reports fastings are generally 100-120s, later in the day are in 90s.  Recommended to continue current regimen at this time. Reminded to hold Jardiance on days she gets dehydrated. Denies frequent need for antiemetics or antidiarrheal.   Hypertension: Controlled per recent clinic readings; current treatment: carvedilol 25 mg BID; lisinopril 40 mg QAM, amlodipine 10 mg QAM BP checked with chemo. Remains well controlled.   Recommended to continue current regimen  at this time  Hyperlipidemia, secondary ASCVD risk reduction Controlled; current  treatment: atorvastatin 80 mg daily; follows w/ Dr. Melrose Nakay[facility] Antiplatelet therapy: aspirin 81 mg daily, clopidogrel 75 mg daily Recommended to continue current regimen.  Breast Cancer of L breast (ER/PR +, HER2 -) Treated by cancer center; weekly Taxol Nausea/vomiting: dexamethasone 8 mg, ondansetron 8 mg BID scheduled for 3 days after chemo; prochlorperizine 10 mg PRN nausea Hypokalemia: potassium 20 mEq BID per Dr. Tasia Catchings Hypomagnesemia: slow man daily  Advised holding Jardiance on days with risk of dehydration as above Continue current regimen at this time, along with collaboration with oncology  Supplements: Vitamin B12, Vitamin D, K and Mag as above  Patient Goals/Self-Care Activities Over the next 90 days, patient will:  - take medications as prescribed check blood glucose three times daily using CGM, document, and provide at future appointments      Plan: Telephone follow up appointment with care management team member scheduled for:  8 weeks   Catie [name], PharmD, [place], CPP Clinical Pharmacist [employer] [facility] [employer] [phone]

## 2021-12-03 ENCOUNTER — Encounter: Payer: Self-pay | Admitting: Oncology

## 2021-12-04 ENCOUNTER — Encounter: Payer: Self-pay | Admitting: Oncology

## 2021-12-05 ENCOUNTER — Inpatient Hospital Stay: Payer: PPO

## 2021-12-05 ENCOUNTER — Inpatient Hospital Stay (HOSPITAL_BASED_OUTPATIENT_CLINIC_OR_DEPARTMENT_OTHER): Payer: PPO | Admitting: Oncology

## 2021-12-05 ENCOUNTER — Other Ambulatory Visit: Payer: Self-pay

## 2021-12-05 ENCOUNTER — Encounter: Payer: Self-pay | Admitting: Oncology

## 2021-12-05 VITALS — BP 134/71 | HR 76

## 2021-12-05 VITALS — BP 130/63 | HR 75 | Temp 97.5°F

## 2021-12-05 DIAGNOSIS — C50912 Malignant neoplasm of unspecified site of left female breast: Secondary | ICD-10-CM | POA: Diagnosis not present

## 2021-12-05 DIAGNOSIS — Z170 Estrogen receptor positive status [ER+]: Secondary | ICD-10-CM

## 2021-12-05 DIAGNOSIS — Z5111 Encounter for antineoplastic chemotherapy: Secondary | ICD-10-CM

## 2021-12-05 DIAGNOSIS — T451X5A Adverse effect of antineoplastic and immunosuppressive drugs, initial encounter: Secondary | ICD-10-CM

## 2021-12-05 DIAGNOSIS — D6481 Anemia due to antineoplastic chemotherapy: Secondary | ICD-10-CM

## 2021-12-05 DIAGNOSIS — E876 Hypokalemia: Secondary | ICD-10-CM

## 2021-12-05 DIAGNOSIS — R634 Abnormal weight loss: Secondary | ICD-10-CM

## 2021-12-05 LAB — CBC WITH DIFFERENTIAL/PLATELET
Abs Immature Granulocytes: 0.06 10*3/uL (ref 0.00–0.07)
Basophils Absolute: 0 10*3/uL (ref 0.0–0.1)
Basophils Relative: 1 %
Eosinophils Absolute: 0.1 10*3/uL (ref 0.0–0.5)
Eosinophils Relative: 2 %
HCT: 33.9 % — ABNORMAL LOW (ref 36.0–46.0)
Hemoglobin: 10.5 g/dL — ABNORMAL LOW (ref 12.0–15.0)
Immature Granulocytes: 1 %
Lymphocytes Relative: 31 %
Lymphs Abs: 1.5 10*3/uL (ref 0.7–4.0)
MCH: 29.4 pg (ref 26.0–34.0)
MCHC: 31 g/dL (ref 30.0–36.0)
MCV: 95 fL (ref 80.0–100.0)
Monocytes Absolute: 0.3 10*3/uL (ref 0.1–1.0)
Monocytes Relative: 7 %
Neutro Abs: 2.7 10*3/uL (ref 1.7–7.7)
Neutrophils Relative %: 58 %
Platelets: 208 10*3/uL (ref 150–400)
RBC: 3.57 MIL/uL — ABNORMAL LOW (ref 3.87–5.11)
RDW: 14.4 % (ref 11.5–15.5)
WBC: 4.7 10*3/uL (ref 4.0–10.5)
nRBC: 0 % (ref 0.0–0.2)

## 2021-12-05 LAB — COMPREHENSIVE METABOLIC PANEL
ALT: 25 U/L (ref 0–44)
AST: 20 U/L (ref 15–41)
Albumin: 3.8 g/dL (ref 3.5–5.0)
Alkaline Phosphatase: 51 U/L (ref 38–126)
Anion gap: 8 (ref 5–15)
BUN: 12 mg/dL (ref 8–23)
CO2: 19 mmol/L — ABNORMAL LOW (ref 22–32)
Calcium: 9 mg/dL (ref 8.9–10.3)
Chloride: 110 mmol/L (ref 98–111)
Creatinine, Ser: 0.7 mg/dL (ref 0.44–1.00)
GFR, Estimated: >60 mL/min (ref >60)
Glucose, Bld: 172 mg/dL — ABNORMAL HIGH (ref 70–99)
Potassium: 3.8 mmol/L (ref 3.5–5.1)
Sodium: 137 mmol/L (ref 135–145)
Total Bilirubin: 0.5 mg/dL (ref 0.3–1.2)
Total Protein: 6.3 g/dL — ABNORMAL LOW (ref 6.5–8.1)

## 2021-12-05 LAB — MAGNESIUM: Magnesium: 1.8 mg/dL (ref 1.7–2.4)

## 2021-12-05 MED ORDER — DIPHENHYDRAMINE HCL 50 MG/ML IJ SOLN
50.0000 mg | Freq: Once | INTRAMUSCULAR | Status: AC
  Administered 2021-12-05: 50 mg via INTRAVENOUS
  Filled 2021-12-05: qty 1

## 2021-12-05 MED ORDER — SODIUM CHLORIDE 0.9 % IV SOLN
Freq: Once | INTRAVENOUS | Status: AC
  Filled 2021-12-05: qty 250

## 2021-12-05 MED ORDER — SODIUM CHLORIDE 0.9 % IV SOLN
10.0000 mg | Freq: Once | INTRAVENOUS | Status: AC
  Administered 2021-12-05: 10 mg via INTRAVENOUS
  Filled 2021-12-05: qty 10

## 2021-12-05 MED ORDER — SODIUM CHLORIDE 0.9% FLUSH
10.0000 mL | Freq: Once | INTRAVENOUS | Status: AC
  Administered 2021-12-05: 10 mL via INTRAVENOUS
  Filled 2021-12-05: qty 10

## 2021-12-05 MED ORDER — FAMOTIDINE IN NACL 20-0.9 MG/50ML-% IV SOLN
20.0000 mg | Freq: Once | INTRAVENOUS | Status: AC
  Administered 2021-12-05: 20 mg via INTRAVENOUS
  Filled 2021-12-05: qty 50

## 2021-12-05 MED ORDER — SODIUM CHLORIDE 0.9 % IV SOLN
65.0000 mg/m2 | Freq: Once | INTRAVENOUS | Status: AC
  Administered 2021-12-05: 102 mg via INTRAVENOUS
  Filled 2021-12-05: qty 17

## 2021-12-05 MED ORDER — HEPARIN SOD (PORK) LOCK FLUSH 100 UNIT/ML IV SOLN
500.0000 [IU] | Freq: Once | INTRAVENOUS | Status: AC | PRN
  Administered 2021-12-05: 500 [IU]
  Filled 2021-12-05: qty 5

## 2021-12-05 NOTE — Progress Notes (Signed)
Pt here for follow up. Pt states that she would like to return to Friday appts.

## 2021-12-05 NOTE — Progress Notes (Signed)
Per MD, ok to proceed with treatment today.

## 2021-12-05 NOTE — Progress Notes (Signed)
Hematology/Oncology Progress note [phone]    Patient Care Team: McLean-Scocuzza, Nino Glow, MD as PCP - General (Internal Medicine) De Hollingshead, [place] as Pharmacist (Pharmacist) Theodore Demark, RN as Oncology Nurse Navigator  REFERRING PROVIDER: McLean-Scocuzza, Olivia Mackie *  CHIEF COMPLAINTS/REASON FOR VISIT:  multifocal left breast cancer  HISTORY OF PRESENTING ILLNESS:   Tammy Nunez is a  71 y.o.  female with PMH listed below was seen in consultation at the request of  McLean-Scocuzza, Olivia Mackie *  for evaluation of multifocal left breast cancer.   04/24/2021, screening mammogram showed large asymmetry in the superior aspect of the breast with associated calcifications and possible masses and a subtle distortions.  No suspicious findings for malignancy right breast.  05/02/2021 left breast diagnostic mammogram and ultrasound showed multiple irregular mass in the left breast. Mammogram mass 1 irregular mass associated with distortion in the upper breast at middle depth, multiple adjacent and possible continuous irregular masses, conglomeration of masses measures at least 5.6 cm.  Associated pleomorphic calcifications extending at least 5 x 3.9 x 3.7 cm Mass 2, 1 cm mass in the upper breast at the middle to posterior depth. Mass 3, 5 mm irregular mass in the upper breast at posterior depth. In total, mass 1-mass 3 span at least 8.2 cm in anterior-posterior extent.  By ultrasound  mass 1 12:00 5 cm from nipple, 2.7 x 1.5 x 4.3 cm Mass 2, 12:00 12 cm from nipple, 0.9 x 0.9 x 0.5 cm Mass 3   12:00 14 cm from nipple, 0.4 x 0.4 x 0.4 cm-uses 2.4 cm superior to mass to Mass 4   11:00  No suspicious left axillary adenopathy.   05/07/2021 -Ultrasound-guided breast biopsy Breast left 12:00 mass 5 cm from nipple biopsy showed invasive mammary carcinoma, no special type, grade 3, high-grade DCIS present, no LVI, ER 90%, PR 11-50%, HER2 negative by IHC Breast left 11:00  mass 10 cm from nipple biopsy showed invasive mammary carcinoma, no special type, grade 2, no DCIS/LVI identified.ER 90%, PR 11-50%, HER2 negative by IHC  Patient is married has no children.  Denies any hormone replacement, oral contraceptive use, family history of breast cancer.  Denies any chest radiation. Patient has a history of stroke with chronic residual weakness of the left side.  She walks with a cane.  05/22/2021 MRI breast showed that nonmass like malignancy spanning over 8 cm, abuting anterior aspect of pectoralis muscle. Discussed with Dr.Cintron and we agree that she will need mastectomy. There is concern of possible positive margin. I recommend neoadjuvant chemotherapy  # medi port placed on 06/06/2021. 06/13/2021 pre chemotherapy Echo. LVEF 60-65%.  Normal global longitudinal strain. Grade 1 diastolic dysfunction.   # 05/22/2021 MRI breast showed  Biopsy-proven malignancy within the UPPER LEFT breast spanning a distance of 8.3 x 4.2 x 4 cm nonmass like enhancement abuts the anterior aspect of the LEFT pectoralis muscle without gross invasion. Oncotype Dx 27. Discussed with Dr.Cintron, there is concern of positive margin.   #Chemotherapy 06/21/2021 - 08/16/2021 ddAC x4 with G-CSF support. 08/30/2021 - 09/06/2021, weekly Taxol. Chemotherapy break due to side effects, UTI and the patient's preference. 10/25/2021  INTERVAL HISTORY Tammy Nunez is a 71 y.o. female who has above history reviewed by me today presents for follow up visit for chemotherapy of breast cancer Patient was accompanied by her husband. Her appetite has improved after taking 1 dose of dexamethasone 2 mg after last Taxol treatments.  No nausea vomiting diarrhea.   Review of  Systems  Constitutional:  Positive for fatigue. Negative for appetite change, chills and fever.  HENT:   Negative for hearing loss and voice change.   Eyes:  Negative for eye problems.  Respiratory:  Negative for chest tightness and cough.    Cardiovascular:  Negative for chest pain.  Gastrointestinal:  Negative for abdominal distention, abdominal pain, blood in stool, diarrhea and nausea.  Endocrine: Negative for hot flashes.  Genitourinary:  Negative for difficulty urinating, dysuria and frequency.   Musculoskeletal:  Negative for arthralgias.  Skin:  Negative for itching and rash.  Neurological:  Negative for extremity weakness.  Hematological:  Negative for adenopathy. Does not bruise/bleed easily.  Psychiatric/Behavioral:  Negative for confusion.    MEDICAL HISTORY:  Past Medical History:  Diagnosis Date   Breast cancer in female [facility]) 05/08/2021   Chronic left shoulder pain    Coronary artery disease    Diabetes mellitus without complication ([place])    Dysrhythmia    Hypertension    Hypomagnesemia 09/13/2021   Paralysis ([place])    weakness left upper amd lower extremity   PONV (postoperative nausea and vomiting)    Stroke [facility])     SURGICAL HISTORY: Past Surgical History:  Procedure Laterality Date   BREAST BIOPSY Left 05/07/2021   12:00 5cmfn vision clip path pending   BREAST BIOPSY Left 05/07/2021   11:00 10 cmfn mini cork clip path pending   CAROTID PTA/STENT INTERVENTION Left 07/20/2019   Procedure: CAROTID PTA/STENT INTERVENTION;  Surgeon: Katha Cabal, MD;  Location: [facility];  Service: Cardiovascular;  Laterality: Left;   ECTOPIC PREGNANCY SURGERY     PORTACATH PLACEMENT N/A 06/06/2021   Procedure: INSERTION PORT-A-CATH;  Surgeon: Herbert Pun, MD;  Location: [facility] ORS;  Service: General;  Laterality: N/A;    SOCIAL HISTORY: Social History   Socioeconomic History   Marital status: Married    Spouse name: Not on file   Number of children: Not on file   Years of education: Not on file   Highest education level: Not on file  Occupational History   Not on file  Tobacco Use   Smoking status: Former    Packs/day: 0.50    Years: 15.00    Pack years: 7.50    Types:  Cigarettes    Quit date: 04/11/2019    Years since quitting: 2.6   Smokeless tobacco: Never  Vaping Use   Vaping Use: Never used  Substance and Sexual Activity   Alcohol use: Never   Drug use: Never   Sexual activity: Not Currently  Other Topics Concern   Not on file  Social History Narrative   No kids    Married with husband    Used to work [employer] broadcast assistant    Social Determinants of Radio broadcast assistant Strain: Low Risk    Difficulty of Paying Living Expenses: Not hard at all  Food Insecurity: Not on file  Transportation Needs: Not on file  Physical Activity: Not on file  Stress: Not on file  Social Connections: Not on file  Intimate Partner Violence: Not on file    FAMILY HISTORY: Family History  Problem Relation Age of Onset   Diabetes type II Mother    Hypertension Father    Heart attack Father    Diabetes type II Brother    Breast cancer Neg Hx     ALLERGIES:  has No Known Allergies.  MEDICATIONS:  Current Outpatient Medications  Medication Sig Dispense Refill  amLODipine (NORVASC) 10 MG tablet Take 1 tablet (10 mg total) by mouth daily. 90 tablet 3   aspirin 81 MG EC tablet Take 1 tablet (81 mg total) by mouth daily. 90 tablet 3   atorvastatin (LIPITOR) 80 MG tablet Take 1 tablet (80 mg total) by mouth daily. 90 tablet 3   blood glucose meter kit and supplies 1 each by Other route 4 (four) times daily. Dispense based on patient and insurance preference. Four times daily as directed. (FOR ICD-10 E10.9, E11.9). 1 each 0   carvedilol (COREG) 25 MG tablet Take 1 tablet (25 mg total) by mouth 2 (two) times daily with a meal. 180 tablet 3   clopidogrel (PLAVIX) 75 MG tablet Take 1 tablet (75 mg total) by mouth daily. Further refills [facility] neurology 90 tablet 3   Continuous Blood Gluc Sensor (FREESTYLE LIBRE 2 SENSOR) MISC USE TO CHECK GLUCOSE AT LEAST EVERY 8 HOURS 6 each 0   dexamethasone (DECADRON) 2 MG tablet Take 1 tablet (2 mg total) by  mouth daily. 4 tablet 0   empagliflozin (JARDIANCE) 25 MG TABS tablet Take 1 tablet (25 mg total) by mouth daily. 90 tablet 3   lidocaine-prilocaine (EMLA) cream Apply to affected area once 30 g 3   lisinopril (ZESTRIL) 40 MG tablet Take 1 tablet (40 mg total) by mouth daily. 90 tablet 3   loperamide (IMODIUM) 2 MG capsule Take 1 capsule (2 mg total) by mouth See admin instructions. Take 14m at the onset of diarrhea, and then 252mafter each loose bowel movement, maximum 1639mer 24 hours. 30 capsule 1   loratadine (CLARITIN) 10 MG tablet Take 1 tablet (10 mg total) by mouth daily. 90 tablet 3   magnesium chloride (SLOW-MAG) 64 MG TBEC SR tablet Take 1 tablet (64 mg total) by mouth daily. 30 tablet 1   Multiple Vitamin (MULTIVITAMIN) tablet Take 1 tablet by mouth daily.     ondansetron (ZOFRAN) 8 MG tablet Take 1 tablet (8 mg total) by mouth 2 (two) times daily as needed. Start on the third day after chemotherapy. 30 tablet 1   potassium chloride SA (KLOR-CON M20) 20 MEQ tablet Take 1 tablet (20 mEq total) by mouth daily. 90 tablet 3   prochlorperazine (COMPAZINE) 10 MG tablet Take 1 tablet (10 mg total) by mouth every 6 (six) hours as needed (Nausea or vomiting). 30 tablet 1   vitamin B-12 (CYANOCOBALAMIN) 1000 MCG tablet Take 1 tablet (1,000 mcg total) by mouth daily. 30 tablet 1   Vitamin D, Cholecalciferol, 50 MCG (2000 UT) CAPS Take 2,000 Units by mouth.     No current facility-administered medications for this visit.   Facility-Administered Medications Ordered in Other Visits  Medication Dose Route Frequency Provider Last Rate Last Admin   heparin lock flush 100 UNIT/ML injection              PHYSICAL EXAMINATION: ECOG PERFORMANCE STATUS: 2 - Symptomatic, <50% confined to bed Vitals:   12/05/21 1003  BP: 130/63  Pulse: 75  Temp: (!) 97.5 F (36.4 C)   There were no vitals filed for this visit.   Physical Exam Constitutional:      General: She is not in acute distress. HENT:      Head: Normocephalic and atraumatic.  Eyes:     General: No scleral icterus. Cardiovascular:     Rate and Rhythm: Normal rate and regular rhythm.     Heart sounds: Normal heart sounds.  Pulmonary:  Effort: Pulmonary effort is normal. No respiratory distress.     Breath sounds: No wheezing.  Abdominal:     General: Bowel sounds are normal. There is no distension.     Palpations: Abdomen is soft.  Musculoskeletal:        General: No deformity. Normal range of motion.     Cervical back: Normal range of motion and neck supple.  Skin:    General: Skin is warm and dry.     Findings: No erythema or rash.  Neurological:     Mental Status: She is alert and oriented to person, place, and time. Mental status is at baseline.     Comments: Chronic left upper and lower extremity weakness.  Psychiatric:        Mood and Affect: Mood normal.      LABORATORY DATA:  I have reviewed the data as listed Lab Results  Component Value Date   WBC 4.7 12/05/2021   HGB 10.5 (L) 12/05/2021   HCT 33.9 (L) 12/05/2021   MCV 95.0 12/05/2021   PLT 208 12/05/2021   Recent Labs    11/08/21 0841 11/27/21 0817 12/05/21 0833  NA 135 136 137  K 4.0 4.3 3.8  CL 105 107 110  CO2 19* 21* 19*  GLUCOSE 157* 143* 172*  BUN _0 CREATININE 0.86 0.90 0.70  CALCIUM 9.9 10.0 9.0  GFRNONAA >60 >60 >60  PROT 6.9 6.8 6.3*  ALBUMIN 3.8 3.5 3.8  AST _1 ALT _2 ALKPHOS 66 75 51  BILITOT 0.6 0.3 0.5    Iron/TIBC/Ferritin/ %Sat    Component Value Date/Time   IRON 113 12/08/2019 1015   TIBC 317 12/08/2019 1015   FERRITIN 67 12/08/2019 1015   IRONPCTSAT 36 12/08/2019 1015       RADIOGRAPHIC STUDIES: I have personally reviewed the radiological images as listed and agreed with the findings in the report. No results found.     ASSESSMENT & PLAN:  1. Encounter for antineoplastic chemotherapy   2. Malignant neoplasm of left breast in female, estrogen receptor positive,  unspecified site of breast ([place])   3. Hypomagnesemia   4. Hypokalemia   5. Weight loss   6. Antineoplastic chemotherapy induced anemia    Cancer Staging  Invasive carcinoma of breast ([place]) Staging form: Breast, AJCC 8th Edition - Clinical stage from 05/16/2021: Stage IIB (cT3, cN0, cM0, G3, ER+, PR+, HER2-) - Signed by [name], MD on 06/03/2021  #Left breast invasive carcinoma, multiple sites.  ER 90%, PR 11-50% positive, HER2 negative by IHC cT3 N0 On neoadjuvant chemotherapy, finished 4 cycles of ddAC Interval MRI showed stable size. On weekly Taxol.  Labs reviewed and discussed with patient Proceed with Taxol 65 mg/m2  #Weight loss, continue nutrition supplementation., follow up with dietitian.  Appetite appears to have improved after taking dexamethasone. Advised patient to take dexamethasone daily for 1 to 2 days after each chemotherapy treatments.  #Hypomagnesia, magnesium has improved.  Continue Slow-Mag 1 tablet daily. #Chemotherapy-induced anemia, hemoglobin decreased at 10.5.  Close monitor weekly. #Hypokalemia, continue potassium chloride to 20-meq twice daily.  Potassium is stable.  Follow up follow-up in 1 week for Taxol treatment. All questions were answered. The patient knows to call the clinic with any problems questions or concerns.  cc McLean-Scocuzza, Olam Idler, MD, PhD 12/05/2021

## 2021-12-05 NOTE — Patient Instructions (Signed)
[facility]  Discharge Instructions: ?Thank you for choosing [facility] to provide your oncology and hematology care.  ?If you have a lab appointment with the Cancer Center, please go directly to the Cancer Center and check in at the registration area. ? ?Wear comfortable clothing and clothing appropriate for easy access to any Portacath or PICC line.  ? ?We strive to give you quality time with your provider. You may need to reschedule your appointment if you arrive late (15 or more minutes).  Arriving late affects you and other patients whose appointments are after yours.  Also, if you miss three or more appointments without notifying the office, you may be dismissed from the clinic at the provider?s discretion.    ?  ?For prescription refill requests, have your pharmacy contact our office and allow 72 hours for refills to be completed.   ? ?  ?To help prevent nausea and vomiting after your treatment, we encourage you to take your nausea medication as directed. ? ?BELOW ARE SYMPTOMS THAT SHOULD BE REPORTED IMMEDIATELY: ?*FEVER GREATER THAN 100.4 F (38 ?C) OR HIGHER ?*CHILLS OR SWEATING ?*NAUSEA AND VOMITING THAT IS NOT CONTROLLED WITH YOUR NAUSEA MEDICATION ?*UNUSUAL SHORTNESS OF BREATH ?*UNUSUAL BRUISING OR BLEEDING ?*URINARY PROBLEMS (pain or burning when urinating, or frequent urination) ?*BOWEL PROBLEMS (unusual diarrhea, constipation, pain near the anus) ?TENDERNESS IN MOUTH AND THROAT WITH OR WITHOUT PRESENCE OF ULCERS (sore throat, sores in mouth, or a toothache) ?UNUSUAL RASH, SWELLING OR PAIN  ?UNUSUAL VAGINAL DISCHARGE OR ITCHING  ? ?Items with * indicate a potential emergency and should be followed up as soon as possible or go to the Emergency Department if any problems should occur. ? ?Please show the CHEMOTHERAPY ALERT CARD or IMMUNOTHERAPY ALERT CARD at check-in to the Emergency Department and triage nurse. ? ?Should you have questions after your visit  or need to cancel or reschedule your appointment, please contact [facility] [place]  [phone] and follow the prompts.  Office hours are 8:00 a.m. to 4:30 p.m. Monday - Friday. Please note that voicemails left after 4:00 p.m. may not be returned until the following business day.  We are closed weekends and major holidays. You have access to a nurse at all times for urgent questions. Please call the main number to the clinic [phone] and follow the prompts. ? ?For any non-urgent questions, you may also contact your provider using MyChart. We now offer e-Visits for anyone 18 and older to request care online for non-urgent symptoms. For details visit [email or web address]. ?  ?Also download the MyChart app! Go to the app store, search "MyChart", open the app, select [place], and log in with your MyChart username and password. ? ?Due to Covid, a mask is required upon entering the hospital/clinic. If you do not have a mask, one will be given to you upon arrival. For doctor visits, patients may have 1 support person aged 18 or older with them. For treatment visits, patients cannot have anyone with them due to current Covid guidelines and our immunocompromised population.  ?

## 2021-12-11 DIAGNOSIS — E785 Hyperlipidemia, unspecified: Secondary | ICD-10-CM | POA: Insufficient documentation

## 2021-12-11 NOTE — Progress Notes (Signed)
MRN : [id number]  Tammy Nunez is a 71 y.o. (1951/08/16) female who presents with chief complaint of follow carotid blockage.  History of Present Illness:   The patient is seen for follow up evaluation of carotid stenosis status right carotid stent on 07/20/2019.  There were no post operative problems or complications related to the surgery.  The patient denies neck or incisional pain.   The patient denies interval amaurosis fugax. There is no recent history of TIA symptoms or focal motor deficits. There is no prior documented CVA.   The patient denies headache.   The patient is taking enteric-coated aspirin 81 mg daily.   The patient has a history of coronary artery disease, no recent episodes of angina or shortness of breath. The patient denies PAD or claudication symptoms. There is a history of hyperlipidemia which is being treated with a statin.    Carotid Duplex done today shows 1-39% RICA s/p stent and 1-93% LICA.  No change compared to last study in 11/19/2020.   No outpatient medications have been marked as taking for the 12/12/21 encounter (Appointment) with Delana Meyer, Dolores Lory, MD.    Past Medical History:  Diagnosis Date   Breast cancer in female [facility]) 05/08/2021   Chronic left shoulder pain    Coronary artery disease    Diabetes mellitus without complication ([place])    Dysrhythmia    Hypertension    Hypomagnesemia 09/13/2021   Paralysis ([place])    weakness left upper amd lower extremity   PONV (postoperative nausea and vomiting)    Stroke [facility])     Past Surgical History:  Procedure Laterality Date   BREAST BIOPSY Left 05/07/2021   12:00 5cmfn vision clip path pending   BREAST BIOPSY Left 05/07/2021   11:00 10 cmfn mini cork clip path pending   CAROTID PTA/STENT INTERVENTION Left 07/20/2019   Procedure: CAROTID PTA/STENT INTERVENTION;  Surgeon: Katha Cabal, MD;  Location: [place] [facility];  Service: Cardiovascular;  Laterality: Left;   ECTOPIC  PREGNANCY SURGERY     PORTACATH PLACEMENT N/A 06/06/2021   Procedure: INSERTION PORT-A-CATH;  Surgeon: Herbert Pun, MD;  Location: [facility] ORS;  Service: General;  Laterality: N/A;    Social History Social History   Tobacco Use   Smoking status: Former    Packs/day: 0.50    Years: 15.00    Pack years: 7.50    Types: Cigarettes    Quit date: 04/11/2019    Years since quitting: 2.6   Smokeless tobacco: Never  Vaping Use   Vaping Use: Never used  Substance Use Topics   Alcohol use: Never   Drug use: Never    Family History Family History  Problem Relation Age of Onset   Diabetes type II Mother    Hypertension Father    Heart attack Father    Diabetes type II Brother    Breast cancer Neg Hx     No Known Allergies   REVIEW OF SYSTEMS (Negative unless checked)  Constitutional: [] Weight loss  [] Fever  [] Chills Cardiac: [] Chest pain   [] Chest pressure   [] Palpitations   [] Shortness of breath when laying flat   [] Shortness of breath with exertion. Vascular:  [] Pain in legs with walking   [] Pain in legs at rest  [] History of DVT   [] Phlebitis   [] Swelling in legs   [] Varicose veins   [] Non-healing ulcers Pulmonary:   [] Uses home oxygen   [] Productive cough   [] Hemoptysis   [] Wheeze  [] COPD   []   Asthma Neurologic:  [] Dizziness   [] Seizures   [] History of stroke   [] History of TIA  [] Aphasia   [] Vissual changes   [] Weakness or numbness in arm   [] Weakness or numbness in leg Musculoskeletal:   [] Joint swelling   [] Joint pain   [] Low back pain Hematologic:  [] Easy bruising  [] Easy bleeding   [] Hypercoagulable state   [] Anemic Gastrointestinal:  [] Diarrhea   [] Vomiting  [] Gastroesophageal reflux/heartburn   [] Difficulty swallowing. Genitourinary:  [] Chronic kidney disease   [] Difficult urination  [] Frequent urination   [] Blood in urine Skin:  [] Rashes   [] Ulcers  Psychological:  [] History of anxiety   []  History of major depression.  Physical Examination  There were no  vitals filed for this visit. There is no height or weight on file to calculate BMI. Gen: WD/WN, NAD Head: [place]/AT, No temporalis wasting.  Ear/Nose/Throat: Hearing grossly intact, nares w/o erythema or drainage Eyes: PER, EOMI, sclera nonicteric.  Neck: Supple, no masses.  No bruit or JVD.  Pulmonary:  Good air movement, no audible wheezing, no use of accessory muscles.  Cardiac: RRR, normal S1, S2, no Murmurs. Vascular:   right carotid bruit Vessel Right Left  Radial Palpable Palpable  Carotid Palpable Palpable  PT Not Palpable Not Palpable  DP Not Palpable Not Palpable  Gastrointestinal: soft, non-distended. No guarding/no peritoneal signs.  Musculoskeletal: M/S 5/5 throughout.  No visible deformity.  Neurologic: CN 2-12 intact. Pain and light touch intact in extremities.  Symmetrical.  Speech is fluent. Motor exam as listed above. Psychiatric: Judgment intact, Mood & affect appropriate for pt's clinical situation. Dermatologic: No rashes or ulcers noted.  No changes consistent with cellulitis.   CBC Lab Results  Component Value Date   WBC 4.7 12/05/2021   HGB 10.5 (L) 12/05/2021   HCT 33.9 (L) 12/05/2021   MCV 95.0 12/05/2021   PLT 208 12/05/2021    BMET    Component Value Date/Time   NA 137 12/05/2021 0833   K 3.8 12/05/2021 0833   CL 110 12/05/2021 0833   CO2 19 (L) 12/05/2021 0833   GLUCOSE 172 (H) 12/05/2021 0833   BUN 12 12/05/2021 0833   CREATININE 0.70 12/05/2021 0833   CALCIUM 9.0 12/05/2021 0833   GFRNONAA >60 12/05/2021 0833   GFRAA >60 07/21/2019 0501   CrCl cannot be calculated (Unknown ideal weight.).  COAG Lab Results  Component Value Date   INR 1.0 06/06/2019    Radiology No results found.   Assessment/Plan 1. Carotid stenosis, symptomatic, with infarction [facility]) Recommend:   The patient is s/p successful right carotid stent on 07/20/2019   Carotid Duplex done today shows 1-39% RICA s/p stent and 2-77% LICA.  No change compared to last  study in 10/2020.     Continue antiplatelet therapy as prescribed Continue management of CAD, HTN and Hyperlipidemia Healthy heart diet,  encouraged exercise at least 4 times per week   Follow up in 12 months.  - VAS US CAROTID; Future  2. Essential hypertension Continue antihypertensive medications as already ordered, these medications have been reviewed and there are no changes at this time.   3. Type 2 diabetes mellitus with hyperglycemia, without long-term current use of insulin (HCC) Continue hypoglycemic medications as already ordered, these medications have been reviewed and there are no changes at this time.  Hgb A1C to be monitored as already arranged by primary service   4. Mixed hyperlipidemia Continue statin as ordered and reviewed, no changes at this time  Hortencia Pilar, MD  12/11/2021 4:26 PM

## 2021-12-12 ENCOUNTER — Ambulatory Visit (INDEPENDENT_AMBULATORY_CARE_PROVIDER_SITE_OTHER): Payer: PPO | Admitting: Vascular Surgery

## 2021-12-12 ENCOUNTER — Other Ambulatory Visit: Payer: Self-pay

## 2021-12-12 ENCOUNTER — Encounter (INDEPENDENT_AMBULATORY_CARE_PROVIDER_SITE_OTHER): Payer: Self-pay | Admitting: Vascular Surgery

## 2021-12-12 ENCOUNTER — Ambulatory Visit (INDEPENDENT_AMBULATORY_CARE_PROVIDER_SITE_OTHER): Payer: PPO

## 2021-12-12 ENCOUNTER — Other Ambulatory Visit (INDEPENDENT_AMBULATORY_CARE_PROVIDER_SITE_OTHER): Payer: Self-pay | Admitting: Vascular Surgery

## 2021-12-12 VITALS — BP 131/81 | HR 83 | Resp 16 | Wt 131.4 lb

## 2021-12-12 DIAGNOSIS — I679 Cerebrovascular disease, unspecified: Secondary | ICD-10-CM

## 2021-12-12 DIAGNOSIS — E1165 Type 2 diabetes mellitus with hyperglycemia: Secondary | ICD-10-CM

## 2021-12-12 DIAGNOSIS — I6523 Occlusion and stenosis of bilateral carotid arteries: Secondary | ICD-10-CM

## 2021-12-12 DIAGNOSIS — I63239 Cerebral infarction due to unspecified occlusion or stenosis of unspecified carotid arteries: Secondary | ICD-10-CM | POA: Diagnosis not present

## 2021-12-12 DIAGNOSIS — E782 Mixed hyperlipidemia: Secondary | ICD-10-CM | POA: Diagnosis not present

## 2021-12-12 DIAGNOSIS — I10 Essential (primary) hypertension: Secondary | ICD-10-CM | POA: Diagnosis not present

## 2021-12-13 ENCOUNTER — Encounter: Payer: Self-pay | Admitting: Oncology

## 2021-12-13 ENCOUNTER — Inpatient Hospital Stay: Payer: PPO

## 2021-12-13 ENCOUNTER — Inpatient Hospital Stay (HOSPITAL_BASED_OUTPATIENT_CLINIC_OR_DEPARTMENT_OTHER): Payer: PPO | Admitting: Oncology

## 2021-12-13 VITALS — BP 114/57 | HR 69 | Temp 98.0°F | Resp 18 | Wt 131.2 lb

## 2021-12-13 DIAGNOSIS — Z5111 Encounter for antineoplastic chemotherapy: Secondary | ICD-10-CM | POA: Diagnosis not present

## 2021-12-13 DIAGNOSIS — Z170 Estrogen receptor positive status [ER+]: Secondary | ICD-10-CM

## 2021-12-13 DIAGNOSIS — C50912 Malignant neoplasm of unspecified site of left female breast: Secondary | ICD-10-CM

## 2021-12-13 DIAGNOSIS — T451X5A Adverse effect of antineoplastic and immunosuppressive drugs, initial encounter: Secondary | ICD-10-CM

## 2021-12-13 DIAGNOSIS — D6481 Anemia due to antineoplastic chemotherapy: Secondary | ICD-10-CM

## 2021-12-13 DIAGNOSIS — E876 Hypokalemia: Secondary | ICD-10-CM

## 2021-12-13 LAB — MAGNESIUM: Magnesium: 1.6 mg/dL — ABNORMAL LOW (ref 1.7–2.4)

## 2021-12-13 LAB — CBC WITH DIFFERENTIAL/PLATELET
Abs Immature Granulocytes: 0.05 10*3/uL (ref 0.00–0.07)
Basophils Absolute: 0 10*3/uL (ref 0.0–0.1)
Basophils Relative: 1 %
Eosinophils Absolute: 0.1 10*3/uL (ref 0.0–0.5)
Eosinophils Relative: 2 %
HCT: 32.6 % — ABNORMAL LOW (ref 36.0–46.0)
Hemoglobin: 10.3 g/dL — ABNORMAL LOW (ref 12.0–15.0)
Immature Granulocytes: 2 %
Lymphocytes Relative: 44 %
Lymphs Abs: 1.3 10*3/uL (ref 0.7–4.0)
MCH: 30.1 pg (ref 26.0–34.0)
MCHC: 31.6 g/dL (ref 30.0–36.0)
MCV: 95.3 fL (ref 80.0–100.0)
Monocytes Absolute: 0.3 10*3/uL (ref 0.1–1.0)
Monocytes Relative: 9 %
Neutro Abs: 1.2 10*3/uL — ABNORMAL LOW (ref 1.7–7.7)
Neutrophils Relative %: 42 %
Platelets: 162 10*3/uL (ref 150–400)
RBC: 3.42 MIL/uL — ABNORMAL LOW (ref 3.87–5.11)
RDW: 15.7 % — ABNORMAL HIGH (ref 11.5–15.5)
WBC: 2.9 10*3/uL — ABNORMAL LOW (ref 4.0–10.5)
nRBC: 0 % (ref 0.0–0.2)

## 2021-12-13 LAB — COMPREHENSIVE METABOLIC PANEL
ALT: 23 U/L (ref 0–44)
AST: 19 U/L (ref 15–41)
Albumin: 3.9 g/dL (ref 3.5–5.0)
Alkaline Phosphatase: 40 U/L (ref 38–126)
Anion gap: 7 (ref 5–15)
BUN: 16 mg/dL (ref 8–23)
CO2: 22 mmol/L (ref 22–32)
Calcium: 9.6 mg/dL (ref 8.9–10.3)
Chloride: 111 mmol/L (ref 98–111)
Creatinine, Ser: 0.68 mg/dL (ref 0.44–1.00)
GFR, Estimated: >60 mL/min (ref >60)
Glucose, Bld: 173 mg/dL — ABNORMAL HIGH (ref 70–99)
Potassium: 3.6 mmol/L (ref 3.5–5.1)
Sodium: 140 mmol/L (ref 135–145)
Total Bilirubin: 0.1 mg/dL — ABNORMAL LOW (ref 0.3–1.2)
Total Protein: 6.2 g/dL — ABNORMAL LOW (ref 6.5–8.1)

## 2021-12-13 MED ORDER — SODIUM CHLORIDE 0.9 % IV SOLN
Freq: Once | INTRAVENOUS | Status: AC
  Filled 2021-12-13: qty 250

## 2021-12-13 MED ORDER — MAGNESIUM SULFATE 2 GM/50ML IV SOLN
2.0000 g | Freq: Once | INTRAVENOUS | Status: AC
  Administered 2021-12-13: 2 g via INTRAVENOUS
  Filled 2021-12-13: qty 50

## 2021-12-13 MED ORDER — FAMOTIDINE IN NACL 20-0.9 MG/50ML-% IV SOLN
20.0000 mg | Freq: Once | INTRAVENOUS | Status: AC
  Administered 2021-12-13: 20 mg via INTRAVENOUS
  Filled 2021-12-13: qty 50

## 2021-12-13 MED ORDER — DIPHENHYDRAMINE HCL 50 MG/ML IJ SOLN
50.0000 mg | Freq: Once | INTRAMUSCULAR | Status: AC
  Administered 2021-12-13: 50 mg via INTRAVENOUS
  Filled 2021-12-13: qty 1

## 2021-12-13 MED ORDER — SODIUM CHLORIDE 0.9 % IV SOLN
INTRAVENOUS | Status: DC
  Filled 2021-12-13: qty 250

## 2021-12-13 MED ORDER — SODIUM CHLORIDE 0.9 % IV SOLN
65.0000 mg/m2 | Freq: Once | INTRAVENOUS | Status: AC
  Administered 2021-12-13: 102 mg via INTRAVENOUS
  Filled 2021-12-13: qty 17

## 2021-12-13 MED ORDER — SODIUM CHLORIDE 0.9 % IV SOLN
10.0000 mg | Freq: Once | INTRAVENOUS | Status: AC
  Administered 2021-12-13: 10 mg via INTRAVENOUS
  Filled 2021-12-13: qty 10

## 2021-12-13 MED ORDER — HEPARIN SOD (PORK) LOCK FLUSH 100 UNIT/ML IV SOLN
500.0000 [IU] | Freq: Once | INTRAVENOUS | Status: AC | PRN
  Administered 2021-12-13: 500 [IU]
  Filled 2021-12-13: qty 5

## 2021-12-13 MED ORDER — DEXAMETHASONE 2 MG PO TABS: 2.0000 mg | ORAL_TABLET | ORAL | 0 refills | Status: DC

## 2021-12-13 NOTE — Progress Notes (Signed)
Per MD all labs reviewed and ok to proceed with treatment today. Patient will also receive 2 grams magnesium IV today.

## 2021-12-13 NOTE — Progress Notes (Signed)
Patient here for follow up. No new concerns voiced.  °

## 2021-12-13 NOTE — Progress Notes (Signed)
Hematology/Oncology Progress note [phone]    Patient Care Team: McLean-Scocuzza, Nino Glow, MD as PCP - General (Internal Medicine) De Hollingshead, [place] as Pharmacist (Pharmacist) Theodore Demark, RN as Oncology Nurse Navigator  REFERRING PROVIDER: McLean-Scocuzza, Olivia Mackie *  CHIEF COMPLAINTS/REASON FOR VISIT:  multifocal left breast cancer  HISTORY OF PRESENTING ILLNESS:   Tammy Nunez is a  71 y.o.  female with PMH listed below was seen in consultation at the request of  McLean-Scocuzza, Olivia Mackie *  for evaluation of multifocal left breast cancer.   04/24/2021, screening mammogram showed large asymmetry in the superior aspect of the breast with associated calcifications and possible masses and a subtle distortions.  No suspicious findings for malignancy right breast.  05/02/2021 left breast diagnostic mammogram and ultrasound showed multiple irregular mass in the left breast. Mammogram mass 1 irregular mass associated with distortion in the upper breast at middle depth, multiple adjacent and possible continuous irregular masses, conglomeration of masses measures at least 5.6 cm.  Associated pleomorphic calcifications extending at least 5 x 3.9 x 3.7 cm Mass 2, 1 cm mass in the upper breast at the middle to posterior depth. Mass 3, 5 mm irregular mass in the upper breast at posterior depth. In total, mass 1-mass 3 span at least 8.2 cm in anterior-posterior extent.  By ultrasound  mass 1 12:00 5 cm from nipple, 2.7 x 1.5 x 4.3 cm Mass 2, 12:00 12 cm from nipple, 0.9 x 0.9 x 0.5 cm Mass 3   12:00 14 cm from nipple, 0.4 x 0.4 x 0.4 cm-uses 2.4 cm superior to mass to Mass 4   11:00  No suspicious left axillary adenopathy.   05/07/2021 -Ultrasound-guided breast biopsy Breast left 12:00 mass 5 cm from nipple biopsy showed invasive mammary carcinoma, no special type, grade 3, high-grade DCIS present, no LVI, ER 90%, PR 11-50%, HER2 negative by IHC Breast left 11:00  mass 10 cm from nipple biopsy showed invasive mammary carcinoma, no special type, grade 2, no DCIS/LVI identified.ER 90%, PR 11-50%, HER2 negative by IHC  Patient is married has no children.  Denies any hormone replacement, oral contraceptive use, family history of breast cancer.  Denies any chest radiation. Patient has a history of stroke with chronic residual weakness of the left side.  She walks with a cane.  05/22/2021 MRI breast showed that nonmass like malignancy spanning over 8 cm, abuting anterior aspect of pectoralis muscle. Discussed with Dr.Cintron and we agree that she will need mastectomy. There is concern of possible positive margin. I recommend neoadjuvant chemotherapy  # medi port placed on 06/06/2021. 06/13/2021 pre chemotherapy Echo. LVEF 60-65%.  Normal global longitudinal strain. Grade 1 diastolic dysfunction.   # 05/22/2021 MRI breast showed  Biopsy-proven malignancy within the UPPER LEFT breast spanning a distance of 8.3 x 4.2 x 4 cm nonmass like enhancement abuts the anterior aspect of the LEFT pectoralis muscle without gross invasion. Oncotype Dx 27. Discussed with Dr.Cintron, there is concern of positive margin.   #Chemotherapy 06/21/2021 - 08/16/2021 ddAC x4 with G-CSF support. 08/30/2021 - 09/06/2021, weekly Taxol. Chemotherapy break due to side effects, UTI and the patient's preference. 10/25/2021  INTERVAL HISTORY Tammy Nunez is a 71 y.o. female who has above history reviewed by me today presents for follow up visit for chemotherapy of breast cancer Patient was accompanied by her husband. Weight is stable, overall she has gained weight comparing to few weeks ago. She feels her appetite has improved since she takes steroids after  chemo.  Request refill.  No nausea vomiting diarrhea.     Review of Systems  Constitutional:  Positive for fatigue. Negative for appetite change, chills and fever.  HENT:   Negative for hearing loss and voice change.   Eyes:  Negative  for eye problems.  Respiratory:  Negative for chest tightness and cough.   Cardiovascular:  Negative for chest pain.  Gastrointestinal:  Negative for abdominal distention, abdominal pain, blood in stool, diarrhea and nausea.  Endocrine: Negative for hot flashes.  Genitourinary:  Negative for difficulty urinating, dysuria and frequency.   Musculoskeletal:  Negative for arthralgias.  Skin:  Negative for itching and rash.  Neurological:  Negative for extremity weakness.  Hematological:  Negative for adenopathy. Does not bruise/bleed easily.  Psychiatric/Behavioral:  Negative for confusion.    MEDICAL HISTORY:  Past Medical History:  Diagnosis Date   Breast cancer in female [facility]) 05/08/2021   Chronic left shoulder pain    Coronary artery disease    Diabetes mellitus without complication ([place])    Dysrhythmia    Hypertension    Hypomagnesemia 09/13/2021   Paralysis ([place])    weakness left upper amd lower extremity   PONV (postoperative nausea and vomiting)    Stroke [facility])     SURGICAL HISTORY: Past Surgical History:  Procedure Laterality Date   BREAST BIOPSY Left 05/07/2021   12:00 5cmfn vision clip path pending   BREAST BIOPSY Left 05/07/2021   11:00 10 cmfn mini cork clip path pending   CAROTID PTA/STENT INTERVENTION Left 07/20/2019   Procedure: CAROTID PTA/STENT INTERVENTION;  Surgeon: Katha Cabal, MD;  Location: [place] [facility];  Service: Cardiovascular;  Laterality: Left;   ECTOPIC PREGNANCY SURGERY     PORTACATH PLACEMENT N/A 06/06/2021   Procedure: INSERTION PORT-A-CATH;  Surgeon: Herbert Pun, MD;  Location: [facility] ORS;  Service: General;  Laterality: N/A;    SOCIAL HISTORY: Social History   Socioeconomic History   Marital status: Married    Spouse name: Not on file   Number of children: Not on file   Years of education: Not on file   Highest education level: Not on file  Occupational History   Not on file  Tobacco Use   Smoking status: Former     Packs/day: 0.50    Years: 15.00    Pack years: 7.50    Types: Cigarettes    Quit date: 04/11/2019    Years since quitting: 2.6   Smokeless tobacco: Never  Vaping Use   Vaping Use: Never used  Substance and Sexual Activity   Alcohol use: Never   Drug use: Never   Sexual activity: Not Currently  Other Topics Concern   Not on file  Social History Narrative   No kids    Married with husband    Used to work [employer] broadcast assistant    Social Determinants of Radio broadcast assistant Strain: Low Risk    Difficulty of Paying Living Expenses: Not hard at all  Food Insecurity: Not on file  Transportation Needs: Not on file  Physical Activity: Not on file  Stress: Not on file  Social Connections: Not on file  Intimate Partner Violence: Not on file    FAMILY HISTORY: Family History  Problem Relation Age of Onset   Diabetes type II Mother    Hypertension Father    Heart attack Father    Diabetes type II Brother    Breast cancer Neg Hx     ALLERGIES:  has  No Known Allergies.  MEDICATIONS:  Current Outpatient Medications  Medication Sig Dispense Refill   amLODipine (NORVASC) 10 MG tablet Take 1 tablet (10 mg total) by mouth daily. 90 tablet 3   aspirin 81 MG EC tablet Take 1 tablet (81 mg total) by mouth daily. 90 tablet 3   atorvastatin (LIPITOR) 80 MG tablet Take 1 tablet (80 mg total) by mouth daily. 90 tablet 3   blood glucose meter kit and supplies 1 each by Other route 4 (four) times daily. Dispense based on patient and insurance preference. Four times daily as directed. (FOR ICD-10 E10.9, E11.9). 1 each 0   carvedilol (COREG) 25 MG tablet Take 1 tablet (25 mg total) by mouth 2 (two) times daily with a meal. 180 tablet 3   clopidogrel (PLAVIX) 75 MG tablet Take 1 tablet (75 mg total) by mouth daily. Further refills [facility] neurology 90 tablet 3   Continuous Blood Gluc Sensor (FREESTYLE LIBRE 2 SENSOR) MISC USE TO CHECK GLUCOSE AT LEAST EVERY 8 HOURS 6 each 0    empagliflozin (JARDIANCE) 25 MG TABS tablet Take 1 tablet (25 mg total) by mouth daily. 90 tablet 3   lidocaine-prilocaine (EMLA) cream Apply to affected area once 30 g 3   lisinopril (ZESTRIL) 40 MG tablet Take 1 tablet (40 mg total) by mouth daily. 90 tablet 3   loperamide (IMODIUM) 2 MG capsule Take 1 capsule (2 mg total) by mouth See admin instructions. Take 42m at the onset of diarrhea, and then 257mafter each loose bowel movement, maximum 1644mer 24 hours. 30 capsule 1   loratadine (CLARITIN) 10 MG tablet Take 1 tablet (10 mg total) by mouth daily. 90 tablet 3   magnesium chloride (SLOW-MAG) 64 MG TBEC SR tablet Take 1 tablet (64 mg total) by mouth daily. 30 tablet 1   Multiple Vitamin (MULTIVITAMIN) tablet Take 1 tablet by mouth daily.     ondansetron (ZOFRAN) 8 MG tablet Take 1 tablet (8 mg total) by mouth 2 (two) times daily as needed. Start on the third day after chemotherapy. 30 tablet 1   potassium chloride SA (KLOR-CON M20) 20 MEQ tablet Take 1 tablet (20 mEq total) by mouth daily. 90 tablet 3   prochlorperazine (COMPAZINE) 10 MG tablet Take 1 tablet (10 mg total) by mouth every 6 (six) hours as needed (Nausea or vomiting). 30 tablet 1   vitamin B-12 (CYANOCOBALAMIN) 1000 MCG tablet Take 1 tablet (1,000 mcg total) by mouth daily. 30 tablet 1   Vitamin D, Cholecalciferol, 50 MCG (2000 UT) CAPS Take 2,000 Units by mouth.     dexamethasone (DECADRON) 2 MG tablet Take 1 tablet (2 mg total) by mouth See admin instructions. Take 2mg63mily for 2 days after each chemotherapy. 10 tablet 0   No current facility-administered medications for this visit.   Facility-Administered Medications Ordered in Other Visits  Medication Dose Route Frequency Provider Last Rate Last Admin   0.9 %  sodium chloride infusion   Intravenous Continuous Yu, [name] 10 mL/hr at 12/13/21 0912 New Bag at 12/13/21 0912   dexamethasone (DECADRON) 10 mg in sodium chloride 0.9 % 50 mL IVPB  10 mg Intravenous Once Yu, Earlie ServerD 204 mL/hr at 12/13/21 0914 10 mg at 12/13/21 0914   famotidine (PEPCID) IVPB 20 mg premix  20 mg Intravenous Once Yu, [name]       heparin lock flush 100 UNIT/ML injection            PACLitaxel (TAXOL)  102 mg in sodium chloride 0.9 % 250 mL chemo infusion (</= 79m/m2)  65 mg/m2 (Order-Specific) Intravenous Once YEarlie Server MD         PHYSICAL EXAMINATION: ECOG PERFORMANCE STATUS: 2 - Symptomatic, <50% confined to bed Vitals:   12/13/21 0837  BP: (!) 114/57  Pulse: 69  Resp: 18  Temp: 98 F (36.7 C)   Filed Weights   12/13/21 0837  Weight: 131 lb 3.2 oz (59.5 kg)     Physical Exam Constitutional:      General: She is not in acute distress. HENT:     Head: Normocephalic and atraumatic.  Eyes:     General: No scleral icterus. Cardiovascular:     Rate and Rhythm: Normal rate and regular rhythm.     Heart sounds: Normal heart sounds.  Pulmonary:     Effort: Pulmonary effort is normal. No respiratory distress.     Breath sounds: No wheezing.  Abdominal:     General: Bowel sounds are normal. There is no distension.     Palpations: Abdomen is soft.  Musculoskeletal:        General: No deformity. Normal range of motion.     Cervical back: Normal range of motion and neck supple.  Skin:    General: Skin is warm and dry.     Findings: No erythema or rash.  Neurological:     Mental Status: She is alert and oriented to person, place, and time. Mental status is at baseline.     Comments: Chronic left upper and lower extremity weakness.  Psychiatric:        Mood and Affect: Mood normal.      LABORATORY DATA:  I have reviewed the data as listed Lab Results  Component Value Date   WBC 2.9 (L) 12/13/2021   HGB 10.3 (L) 12/13/2021   HCT 32.6 (L) 12/13/2021   MCV 95.3 12/13/2021   PLT 162 12/13/2021   Recent Labs    11/27/21 0817 12/05/21 0833 12/13/21 0813  NA 136 137 140  K 4.3 3.8 3.6  CL 107 110 111  CO2 21* 19* 22  GLUCOSE 143* 172* 173*  BUN _0 CREATININE 0.90 0.70 0.68  CALCIUM 10.0 9.0 9.6  GFRNONAA >60 >60 >60  PROT 6.8 6.3* 6.2*  ALBUMIN 3.5 3.8 3.9  AST _1 ALT _2 ALKPHOS 75 51 40  BILITOT 0.3 0.5 0.1*    Iron/TIBC/Ferritin/ %Sat    Component Value Date/Time   IRON 113 12/08/2019 1015   TIBC 317 12/08/2019 1015   FERRITIN 67 12/08/2019 1015   IRONPCTSAT 36 12/08/2019 1015       RADIOGRAPHIC STUDIES: I have personally reviewed the radiological images as listed and agreed with the findings in the report. No results found.     ASSESSMENT & PLAN:  1. Encounter for antineoplastic chemotherapy   2. Malignant neoplasm of left breast in female, estrogen receptor positive, unspecified site of breast ([place]   3. Hypomagnesemia   4. Hypokalemia   5. Antineoplastic chemotherapy induced anemia    Cancer Staging  Invasive carcinoma of breast ([place] Staging form: Breast, AJCC 8th Edition - Clinical stage from 05/16/2021: Stage IIB (cT3, cN0, cM0, G3, ER+, PR+, HER2-) - Signed by YEarlie Server MD on 06/03/2021  #Left breast invasive carcinoma, multiple sites.  ER 90%, PR 11-50% positive, HER2 negative by IHC cT3 N0 On neoadjuvant chemotherapy, finished 4 cycles of ddAC Interval MRI showed stable size.  On weekly Taxol.  Labs reviewed and discussed with patient. Proceed with Taxol 65 mg/m2  #Weight loss, improving.  Continue nutrition supplementation., follow up with dietitian.  Appetite appears to have improved after taking dexamethasone. Advised patient to take dexamethasone daily for 1 to 2 days after each chemotherapy treatments.  #Hypomagnesia, magnesium 1.6 Patient will receive IV magnesium 2 g x 1 today.  Increase Slow-Mag to 1 tablet twice daily. #Chemotherapy-induced anemia, hemoglobin decreased at 10.3.  Close monitor weekly. #Hypokalemia, potassium is stable.  Continue  potassium chloride to 20-meq twice daily.  Potassium is stable.  Follow up follow-up in 1 week for Taxol treatment. All  questions were answered. The patient knows to call the clinic with any problems questions or concerns.  cc McLean-Scocuzza, Olam Idler, MD, PhD 12/13/2021

## 2021-12-13 NOTE — Patient Instructions (Signed)
[facility]  Discharge Instructions: ?Thank you for choosing [facility] to provide your oncology and hematology care.  ?If you have a lab appointment with the Cancer Center, please go directly to the Cancer Center and check in at the registration area. ? ?Wear comfortable clothing and clothing appropriate for easy access to any Portacath or PICC line.  ? ?We strive to give you quality time with your provider. You may need to reschedule your appointment if you arrive late (15 or more minutes).  Arriving late affects you and other patients whose appointments are after yours.  Also, if you miss three or more appointments without notifying the office, you may be dismissed from the clinic at the provider?s discretion.    ?  ?For prescription refill requests, have your pharmacy contact our office and allow 72 hours for refills to be completed.   ? ?  ?To help prevent nausea and vomiting after your treatment, we encourage you to take your nausea medication as directed. ? ?BELOW ARE SYMPTOMS THAT SHOULD BE REPORTED IMMEDIATELY: ?*FEVER GREATER THAN 100.4 F (38 ?C) OR HIGHER ?*CHILLS OR SWEATING ?*NAUSEA AND VOMITING THAT IS NOT CONTROLLED WITH YOUR NAUSEA MEDICATION ?*UNUSUAL SHORTNESS OF BREATH ?*UNUSUAL BRUISING OR BLEEDING ?*URINARY PROBLEMS (pain or burning when urinating, or frequent urination) ?*BOWEL PROBLEMS (unusual diarrhea, constipation, pain near the anus) ?TENDERNESS IN MOUTH AND THROAT WITH OR WITHOUT PRESENCE OF ULCERS (sore throat, sores in mouth, or a toothache) ?UNUSUAL RASH, SWELLING OR PAIN  ?UNUSUAL VAGINAL DISCHARGE OR ITCHING  ? ?Items with * indicate a potential emergency and should be followed up as soon as possible or go to the Emergency Department if any problems should occur. ? ?Please show the CHEMOTHERAPY ALERT CARD or IMMUNOTHERAPY ALERT CARD at check-in to the Emergency Department and triage nurse. ? ?Should you have questions after your visit  or need to cancel or reschedule your appointment, please contact [facility]  [phone] and follow the prompts.  Office hours are 8:00 a.m. to 4:30 p.m. Monday - Friday. Please note that voicemails left after 4:00 p.m. may not be returned until the following business day.  We are closed weekends and major holidays. You have access to a nurse at all times for urgent questions. Please call the main number to the clinic [phone] and follow the prompts. ? ?For any non-urgent questions, you may also contact your provider using MyChart. We now offer e-Visits for anyone 18 and older to request care online for non-urgent symptoms. For details visit [email or web address]. ?  ?Also download the MyChart app! Go to the app store, search "MyChart", open the app, select [facility], and log in with your MyChart username and password. ? ?Due to Covid, a mask is required upon entering the hospital/clinic. If you do not have a mask, one will be given to you upon arrival. For doctor visits, patients may have 1 support person aged 18 or older with them. For treatment visits, patients cannot have anyone with them due to current Covid guidelines and our immunocompromised population.  ?

## 2021-12-15 ENCOUNTER — Encounter (INDEPENDENT_AMBULATORY_CARE_PROVIDER_SITE_OTHER): Payer: Self-pay | Admitting: Vascular Surgery

## 2021-12-20 ENCOUNTER — Encounter: Payer: Self-pay | Admitting: Oncology

## 2021-12-20 ENCOUNTER — Other Ambulatory Visit: Payer: Self-pay

## 2021-12-20 ENCOUNTER — Inpatient Hospital Stay: Payer: PPO

## 2021-12-20 ENCOUNTER — Inpatient Hospital Stay (HOSPITAL_BASED_OUTPATIENT_CLINIC_OR_DEPARTMENT_OTHER): Payer: PPO | Admitting: Oncology

## 2021-12-20 VITALS — BP 130/66 | HR 73 | Temp 98.7°F | Resp 16 | Wt 132.0 lb

## 2021-12-20 DIAGNOSIS — Z5111 Encounter for antineoplastic chemotherapy: Secondary | ICD-10-CM | POA: Diagnosis not present

## 2021-12-20 DIAGNOSIS — D6481 Anemia due to antineoplastic chemotherapy: Secondary | ICD-10-CM | POA: Diagnosis not present

## 2021-12-20 DIAGNOSIS — C50812 Malignant neoplasm of overlapping sites of left female breast: Secondary | ICD-10-CM

## 2021-12-20 DIAGNOSIS — R634 Abnormal weight loss: Secondary | ICD-10-CM

## 2021-12-20 DIAGNOSIS — E876 Hypokalemia: Secondary | ICD-10-CM

## 2021-12-20 DIAGNOSIS — C50912 Malignant neoplasm of unspecified site of left female breast: Secondary | ICD-10-CM

## 2021-12-20 DIAGNOSIS — Z170 Estrogen receptor positive status [ER+]: Secondary | ICD-10-CM

## 2021-12-20 DIAGNOSIS — T451X5A Adverse effect of antineoplastic and immunosuppressive drugs, initial encounter: Secondary | ICD-10-CM

## 2021-12-20 LAB — COMPREHENSIVE METABOLIC PANEL
ALT: 28 U/L (ref 0–44)
AST: 20 U/L (ref 15–41)
Albumin: 3.7 g/dL (ref 3.5–5.0)
Alkaline Phosphatase: 41 U/L (ref 38–126)
Anion gap: 9 (ref 5–15)
BUN: 14 mg/dL (ref 8–23)
CO2: 21 mmol/L — ABNORMAL LOW (ref 22–32)
Calcium: 9.3 mg/dL (ref 8.9–10.3)
Chloride: 109 mmol/L (ref 98–111)
Creatinine, Ser: 0.72 mg/dL (ref 0.44–1.00)
GFR, Estimated: >60 mL/min (ref >60)
Glucose, Bld: 156 mg/dL — ABNORMAL HIGH (ref 70–99)
Potassium: 3.9 mmol/L (ref 3.5–5.1)
Sodium: 139 mmol/L (ref 135–145)
Total Bilirubin: 0.5 mg/dL (ref 0.3–1.2)
Total Protein: 6.5 g/dL (ref 6.5–8.1)

## 2021-12-20 LAB — CBC WITH DIFFERENTIAL/PLATELET
Abs Immature Granulocytes: 0.08 10*3/uL — ABNORMAL HIGH (ref 0.00–0.07)
Basophils Absolute: 0 10*3/uL (ref 0.0–0.1)
Basophils Relative: 1 %
Eosinophils Absolute: 0.1 10*3/uL (ref 0.0–0.5)
Eosinophils Relative: 1 %
HCT: 34.7 % — ABNORMAL LOW (ref 36.0–46.0)
Hemoglobin: 10.7 g/dL — ABNORMAL LOW (ref 12.0–15.0)
Immature Granulocytes: 2 %
Lymphocytes Relative: 38 %
Lymphs Abs: 1.4 10*3/uL (ref 0.7–4.0)
MCH: 30 pg (ref 26.0–34.0)
MCHC: 30.8 g/dL (ref 30.0–36.0)
MCV: 97.2 fL (ref 80.0–100.0)
Monocytes Absolute: 0.2 10*3/uL (ref 0.1–1.0)
Monocytes Relative: 7 %
Neutro Abs: 1.8 10*3/uL (ref 1.7–7.7)
Neutrophils Relative %: 51 %
Platelets: 167 10*3/uL (ref 150–400)
RBC: 3.57 MIL/uL — ABNORMAL LOW (ref 3.87–5.11)
RDW: 16 % — ABNORMAL HIGH (ref 11.5–15.5)
WBC: 3.6 10*3/uL — ABNORMAL LOW (ref 4.0–10.5)
nRBC: 0 % (ref 0.0–0.2)

## 2021-12-20 LAB — MAGNESIUM: Magnesium: 1.7 mg/dL (ref 1.7–2.4)

## 2021-12-20 MED ORDER — HEPARIN SOD (PORK) LOCK FLUSH 100 UNIT/ML IV SOLN
INTRAVENOUS | Status: AC
  Filled 2021-12-20: qty 5

## 2021-12-20 MED ORDER — SODIUM CHLORIDE 0.9 % IV SOLN
65.0000 mg/m2 | Freq: Once | INTRAVENOUS | Status: AC
  Administered 2021-12-20: 102 mg via INTRAVENOUS
  Filled 2021-12-20: qty 17

## 2021-12-20 MED ORDER — HEPARIN SOD (PORK) LOCK FLUSH 100 UNIT/ML IV SOLN
500.0000 [IU] | Freq: Once | INTRAVENOUS | Status: AC | PRN
  Filled 2021-12-20: qty 5

## 2021-12-20 MED ORDER — DIPHENHYDRAMINE HCL 50 MG/ML IJ SOLN
50.0000 mg | Freq: Once | INTRAMUSCULAR | Status: AC
  Administered 2021-12-20: 50 mg via INTRAVENOUS
  Filled 2021-12-20: qty 1

## 2021-12-20 MED ORDER — SODIUM CHLORIDE 0.9 % IV SOLN
10.0000 mg | Freq: Once | INTRAVENOUS | Status: AC
  Administered 2021-12-20: 10 mg via INTRAVENOUS
  Filled 2021-12-20: qty 10

## 2021-12-20 MED ORDER — HEPARIN SOD (PORK) LOCK FLUSH 100 UNIT/ML IV SOLN
INTRAVENOUS | Status: AC
  Administered 2021-12-20: 500 [IU]
  Filled 2021-12-20: qty 5

## 2021-12-20 MED ORDER — SODIUM CHLORIDE 0.9 % IV SOLN
Freq: Once | INTRAVENOUS | Status: AC
  Filled 2021-12-20: qty 250

## 2021-12-20 MED ORDER — FAMOTIDINE IN NACL 20-0.9 MG/50ML-% IV SOLN
20.0000 mg | Freq: Once | INTRAVENOUS | Status: AC
  Administered 2021-12-20: 20 mg via INTRAVENOUS
  Filled 2021-12-20: qty 50

## 2021-12-20 NOTE — Patient Instructions (Signed)
[facility]  Discharge Instructions: Thank you for choosing [facility] to provide your oncology and hematology care.  If you have a lab appointment with the Cancer Center, please go directly to the Cancer Center and check in at the registration area.  Wear comfortable clothing and clothing appropriate for easy access to any Portacath or PICC line.   We strive to give you quality time with your provider. You may need to reschedule your appointment if you arrive late (15 or more minutes).  Arriving late affects you and other patients whose appointments are after yours.  Also, if you miss three or more appointments without notifying the office, you may be dismissed from the clinic at the provider's discretion.      For prescription refill requests, have your pharmacy contact our office and allow 72 hours for refills to be completed.    Today you received the following chemotherapy and/or immunotherapy agents Taxol      To help prevent nausea and vomiting after your treatment, we encourage you to take your nausea medication as directed.  BELOW ARE SYMPTOMS THAT SHOULD BE REPORTED IMMEDIATELY: *FEVER GREATER THAN 100.4 F (38 C) OR HIGHER *CHILLS OR SWEATING *NAUSEA AND VOMITING THAT IS NOT CONTROLLED WITH YOUR NAUSEA MEDICATION *UNUSUAL SHORTNESS OF BREATH *UNUSUAL BRUISING OR BLEEDING *URINARY PROBLEMS (pain or burning when urinating, or frequent urination) *BOWEL PROBLEMS (unusual diarrhea, constipation, pain near the anus) TENDERNESS IN MOUTH AND THROAT WITH OR WITHOUT PRESENCE OF ULCERS (sore throat, sores in mouth, or a toothache) UNUSUAL RASH, SWELLING OR PAIN  UNUSUAL VAGINAL DISCHARGE OR ITCHING   Items with * indicate a potential emergency and should be followed up as soon as possible or go to the Emergency Department if any problems should occur.  Please show the CHEMOTHERAPY ALERT CARD or IMMUNOTHERAPY ALERT CARD at check-in to the  Emergency Department and triage nurse.  Should you have questions after your visit or need to cancel or reschedule your appointment, please contact [facility]  [phone] and follow the prompts.  Office hours are 8:00 a.m. to 4:30 p.m. Monday - Friday. Please note that voicemails left after 4:00 p.m. may not be returned until the following business day.  We are closed weekends and major holidays. You have access to a nurse at all times for urgent questions. Please call the main number to the clinic [phone] and follow the prompts.  For any non-urgent questions, you may also contact your provider using MyChart. We now offer e-Visits for anyone 18 and older to request care online for non-urgent symptoms. For details visit [email or web address].   Also download the MyChart app! Go to the app store, search "MyChart", open the app, select [facility], and log in with your MyChart username and password.  Due to Covid, a mask is required upon entering the hospital/clinic. If you do not have a mask, one will be given to you upon arrival. For doctor visits, patients may have 1 support person aged 18 or older with them. For treatment visits, patients cannot have anyone with them due to current Covid guidelines and our immunocompromised population.  

## 2021-12-20 NOTE — Progress Notes (Signed)
Hematology/Oncology Progress note [phone]    Patient Care Team: McLean-Scocuzza, Nino Glow, MD as PCP - General (Internal Medicine) De Hollingshead, [place] as Pharmacist (Pharmacist) Theodore Demark, RN as Oncology Nurse Navigator  REFERRING PROVIDER: McLean-Scocuzza, Olivia Mackie *  CHIEF COMPLAINTS/REASON FOR VISIT:  multifocal left breast cancer  HISTORY OF PRESENTING ILLNESS:   Tammy Nunez is a  70 y.o.  female with PMH listed below was seen in consultation at the request of  McLean-Scocuzza, Olivia Mackie *  for evaluation of multifocal left breast cancer.   04/24/2021, screening mammogram showed large asymmetry in the superior aspect of the breast with associated calcifications and possible masses and a subtle distortions.  No suspicious findings for malignancy right breast.  05/02/2021 left breast diagnostic mammogram and ultrasound showed multiple irregular mass in the left breast. Mammogram mass 1 irregular mass associated with distortion in the upper breast at middle depth, multiple adjacent and possible continuous irregular masses, conglomeration of masses measures at least 5.6 cm.  Associated pleomorphic calcifications extending at least 5 x 3.9 x 3.7 cm Mass 2, 1 cm mass in the upper breast at the middle to posterior depth. Mass 3, 5 mm irregular mass in the upper breast at posterior depth. In total, mass 1-mass 3 span at least 8.2 cm in anterior-posterior extent.  By ultrasound  mass 1 12:00 5 cm from nipple, 2.7 x 1.5 x 4.3 cm Mass 2, 12:00 12 cm from nipple, 0.9 x 0.9 x 0.5 cm Mass 3   12:00 14 cm from nipple, 0.4 x 0.4 x 0.4 cm-uses 2.4 cm superior to mass to Mass 4   11:00  No suspicious left axillary adenopathy.   05/07/2021 -Ultrasound-guided breast biopsy Breast left 12:00 mass 5 cm from nipple biopsy showed invasive mammary carcinoma, no special type, grade 3, high-grade DCIS present, no LVI, ER 90%, PR 11-50%, HER2 negative by IHC Breast left 11:00  mass 10 cm from nipple biopsy showed invasive mammary carcinoma, no special type, grade 2, no DCIS/LVI identified.ER 90%, PR 11-50%, HER2 negative by IHC  Patient is married has no children.  Denies any hormone replacement, oral contraceptive use, family history of breast cancer.  Denies any chest radiation. Patient has a history of stroke with chronic residual weakness of the left side.  She walks with a cane.  05/22/2021 MRI breast showed that nonmass like malignancy spanning over 8 cm, abuting anterior aspect of pectoralis muscle. Discussed with Dr.Cintron and we agree that she will need mastectomy. There is concern of possible positive margin. I recommend neoadjuvant chemotherapy  # medi port placed on 06/06/2021. 06/13/2021 pre chemotherapy Echo. LVEF 60-65%.  Normal global longitudinal strain. Grade 1 diastolic dysfunction.   # 05/22/2021 MRI breast showed  Biopsy-proven malignancy within the UPPER LEFT breast spanning a distance of 8.3 x 4.2 x 4 cm nonmass like enhancement abuts the anterior aspect of the LEFT pectoralis muscle without gross invasion. Oncotype Dx 27. Discussed with Dr.Cintron, there is concern of positive margin.   #Chemotherapy 06/21/2021 - 08/16/2021 ddAC x4 with G-CSF support. 08/30/2021 - 09/06/2021, weekly Taxol. Chemotherapy break due to side effects, UTI and the patient's preference. 10/25/2021  INTERVAL HISTORY Tammy Nunez is a 71 y.o. female who has above history reviewed by me today presents for follow up visit for chemotherapy of breast cancer Patient was accompanied by her husband. Patient has been utilizing dexamethasone for 1 to 2 days after Taxol treatments.  This has helped her appetite.  She has gained 1 pound  since last visit.   Review of Systems  Constitutional:  Positive for fatigue. Negative for appetite change, chills and fever.  HENT:   Negative for hearing loss and voice change.   Eyes:  Negative for eye problems.  Respiratory:  Negative for  chest tightness and cough.   Cardiovascular:  Negative for chest pain.  Gastrointestinal:  Negative for abdominal distention, abdominal pain, blood in stool, diarrhea and nausea.  Endocrine: Negative for hot flashes.  Genitourinary:  Negative for difficulty urinating, dysuria and frequency.   Musculoskeletal:  Negative for arthralgias.  Skin:  Negative for itching and rash.  Neurological:  Negative for extremity weakness.  Hematological:  Negative for adenopathy. Does not bruise/bleed easily.  Psychiatric/Behavioral:  Negative for confusion.    MEDICAL HISTORY:  Past Medical History:  Diagnosis Date   Breast cancer in female [facility]) 05/08/2021   Chronic left shoulder pain    Coronary artery disease    Diabetes mellitus without complication ([place])    Dysrhythmia    Hypertension    Hypomagnesemia 09/13/2021   Paralysis ([place])    weakness left upper amd lower extremity   PONV (postoperative nausea and vomiting)    Stroke [facility])     SURGICAL HISTORY: Past Surgical History:  Procedure Laterality Date   BREAST BIOPSY Left 05/07/2021   12:00 5cmfn vision clip path pending   BREAST BIOPSY Left 05/07/2021   11:00 10 cmfn mini cork clip path pending   CAROTID PTA/STENT INTERVENTION Left 07/20/2019   Procedure: CAROTID PTA/STENT INTERVENTION;  Surgeon: Katha Cabal, MD;  Location: [place] [facility];  Service: Cardiovascular;  Laterality: Left;   ECTOPIC PREGNANCY SURGERY     PORTACATH PLACEMENT N/A 06/06/2021   Procedure: INSERTION PORT-A-CATH;  Surgeon: Herbert Pun, MD;  Location: [facility] ORS;  Service: General;  Laterality: N/A;    SOCIAL HISTORY: Social History   Socioeconomic History   Marital status: Married    Spouse name: Not on file   Number of children: Not on file   Years of education: Not on file   Highest education level: Not on file  Occupational History   Not on file  Tobacco Use   Smoking status: Former    Packs/day: 0.50    Years: 15.00    Pack  years: 7.50    Types: Cigarettes    Quit date: 04/11/2019    Years since quitting: 2.6   Smokeless tobacco: Never  Vaping Use   Vaping Use: Never used  Substance and Sexual Activity   Alcohol use: Never   Drug use: Never   Sexual activity: Not Currently  Other Topics Concern   Not on file  Social History Narrative   No kids    Married with husband    Used to work [employer] broadcast assistant    Social Determinants of Radio broadcast assistant Strain: Low Risk    Difficulty of Paying Living Expenses: Not hard at all  Food Insecurity: Not on file  Transportation Needs: Not on file  Physical Activity: Not on file  Stress: Not on file  Social Connections: Not on file  Intimate Partner Violence: Not on file    FAMILY HISTORY: Family History  Problem Relation Age of Onset   Diabetes type II Mother    Hypertension Father    Heart attack Father    Diabetes type II Brother    Breast cancer Neg Hx     ALLERGIES:  has No Known Allergies.  MEDICATIONS:  Current Outpatient  Medications  Medication Sig Dispense Refill   amLODipine (NORVASC) 10 MG tablet Take 1 tablet (10 mg total) by mouth daily. 90 tablet 3   aspirin 81 MG EC tablet Take 1 tablet (81 mg total) by mouth daily. 90 tablet 3   atorvastatin (LIPITOR) 80 MG tablet Take 1 tablet (80 mg total) by mouth daily. 90 tablet 3   blood glucose meter kit and supplies 1 each by Other route 4 (four) times daily. Dispense based on patient and insurance preference. Four times daily as directed. (FOR ICD-10 E10.9, E11.9). 1 each 0   carvedilol (COREG) 25 MG tablet Take 1 tablet (25 mg total) by mouth 2 (two) times daily with a meal. 180 tablet 3   clopidogrel (PLAVIX) 75 MG tablet Take 1 tablet (75 mg total) by mouth daily. Further refills [facility] neurology 90 tablet 3   Continuous Blood Gluc Sensor (FREESTYLE LIBRE 2 SENSOR) MISC USE TO CHECK GLUCOSE AT LEAST EVERY 8 HOURS 6 each 0   dexamethasone (DECADRON) 2 MG tablet Take 1  tablet (2 mg total) by mouth See admin instructions. Take 366m daily for 2 days after each chemotherapy. 10 tablet 0   empagliflozin (JARDIANCE) 25 MG TABS tablet Take 1 tablet (25 mg total) by mouth daily. 90 tablet 3   lidocaine-prilocaine (EMLA) cream Apply to affected area once 30 g 3   lisinopril (ZESTRIL) 40 MG tablet Take 1 tablet (40 mg total) by mouth daily. 90 tablet 3   loperamide (IMODIUM) 2 MG capsule Take 1 capsule (2 mg total) by mouth See admin instructions. Take 464mat the onset of diarrhea, and then 66m4mfter each loose bowel movement, maximum 2m76mr 24 hours. 30 capsule 1   loratadine (CLARITIN) 10 MG tablet Take 1 tablet (10 mg total) by mouth daily. 90 tablet 3   magnesium chloride (SLOW-MAG) 64 MG TBEC SR tablet Take 1 tablet (64 mg total) by mouth daily. 30 tablet 1   Multiple Vitamin (MULTIVITAMIN) tablet Take 1 tablet by mouth daily.     ondansetron (ZOFRAN) 8 MG tablet Take 1 tablet (8 mg total) by mouth 2 (two) times daily as needed. Start on the third day after chemotherapy. 30 tablet 1   potassium chloride SA (KLOR-CON M20) 20 MEQ tablet Take 1 tablet (20 mEq total) by mouth daily. 90 tablet 3   prochlorperazine (COMPAZINE) 10 MG tablet Take 1 tablet (10 mg total) by mouth every 6 (six) hours as needed (Nausea or vomiting). 30 tablet 1   vitamin B-12 (CYANOCOBALAMIN) 1000 MCG tablet Take 1 tablet (1,000 mcg total) by mouth daily. 30 tablet 1   Vitamin D, Cholecalciferol, 50 MCG (2000 UT) CAPS Take 2,000 Units by mouth.     No current facility-administered medications for this visit.   Facility-Administered Medications Ordered in Other Visits  Medication Dose Route Frequency Provider Last Rate Last Admin   heparin lock flush 100 UNIT/ML injection            heparin lock flush 100 UNIT/ML injection              PHYSICAL EXAMINATION: ECOG PERFORMANCE STATUS: 2 - Symptomatic, <50% confined to bed Vitals:   12/20/21 0919  BP: 130/66  Pulse: 73  Resp: 16  Temp:  98.7 F (37.1 C)  SpO2: 100%   Filed Weights   12/20/21 0919  Weight: 132 lb (59.9 kg)     Physical Exam Constitutional:      General: She is not in acute distress.  HENT:     Head: Normocephalic and atraumatic.  Eyes:     General: No scleral icterus. Cardiovascular:     Rate and Rhythm: Normal rate and regular rhythm.     Heart sounds: Normal heart sounds.  Pulmonary:     Effort: Pulmonary effort is normal. No respiratory distress.     Breath sounds: No wheezing.  Abdominal:     General: Bowel sounds are normal. There is no distension.     Palpations: Abdomen is soft.  Musculoskeletal:        General: No deformity. Normal range of motion.     Cervical back: Normal range of motion and neck supple.  Skin:    General: Skin is warm and dry.     Findings: No erythema or rash.  Neurological:     Mental Status: She is alert and oriented to person, place, and time. Mental status is at baseline.     Comments: Chronic left upper and lower extremity weakness.  Psychiatric:        Mood and Affect: Mood normal.  Left breast mass palpable, similar size.    LABORATORY DATA:  I have reviewed the data as listed Lab Results  Component Value Date   WBC 3.6 (L) 12/20/2021   HGB 10.7 (L) 12/20/2021   HCT 34.7 (L) 12/20/2021   MCV 97.2 12/20/2021   PLT 167 12/20/2021   Recent Labs    12/05/21 0833 12/13/21 0813 12/20/21 0820  NA 137 140 139  K 3.8 3.6 3.9  CL 110 111 109  CO2 19* 22 21*  GLUCOSE 172* 173* 156*  BUN 12 16 14   CREATININE 0.70 0.68 0.72  CALCIUM 9.0 9.6 9.3  GFRNONAA >60 >60 >60  PROT 6.3* 6.2* 6.5  ALBUMIN 3.8 3.9 3.7  AST 20 19 20   ALT 25 23 28   ALKPHOS 51 40 41  BILITOT 0.5 0.1* 0.5    Iron/TIBC/Ferritin/ %Sat    Component Value Date/Time   IRON 113 12/08/2019 1015   TIBC 317 12/08/2019 1015   FERRITIN 67 12/08/2019 1015   IRONPCTSAT 36 12/08/2019 1015       RADIOGRAPHIC STUDIES: I have personally reviewed the radiological images as  listed and agreed with the findings in the report. VAS US CAROTID  Result Date: 12/18/2021 Carotid Arterial Duplex Study Patient Name:  Tammy Nunez  Date of Exam:   12/12/2021 Medical Rec #: [id number]        Accession #:    [id number] Date of Birth: 21-Jun-1951        Patient Gender: F Patient Age:   52 years Exam [facility] Procedure:      VAS US CAROTID Referring Phys: Leotis Pain --------------------------------------------------------------------------------  Indications:       Carotid artery disease and Right stent. Comparison Study:  10/2020 Performing Technologist: Concha Norway RVT  Examination Guidelines: A complete evaluation includes B-mode imaging, spectral Doppler, color Doppler, and power Doppler as needed of all accessible portions of each vessel. Bilateral testing is considered an integral part of a complete examination. Limited examinations for reoccurring indications may be performed as noted.  Right Carotid Findings: +----------+--------+--------+--------+------------------+--------+             PSV cm/s EDV cm/s Stenosis Plaque Description Comments  +----------+--------+--------+--------+------------------+--------+  CCA Prox   62       14                                             +----------+--------+--------+--------+------------------+--------+  CCA Mid    75       16                                             +----------+--------+--------+--------+------------------+--------+  CCA Distal 74       20                                   stent     +----------+--------+--------+--------+------------------+--------+  ICA Prox   70       17       1-39%                       stent     +----------+--------+--------+--------+------------------+--------+  ICA Mid    80       19                                             +----------+--------+--------+--------+------------------+--------+  ICA Distal 84       22                                              +----------+--------+--------+--------+------------------+--------+  ECA        74       16                                             +----------+--------+--------+--------+------------------+--------+ +----------+--------+-------+----------------+-------------------+             PSV cm/s EDV cms Describe         Arm Pressure (mmHG)  +----------+--------+-------+----------------+-------------------+  Subclavian 104              Multiphasic, WNL                      +----------+--------+-------+----------------+-------------------+ +---------+--------+--+--------+--+---------+  Vertebral PSV cm/s 94 EDV cm/s 24 Antegrade  +---------+--------+--+--------+--+---------+     Left Carotid Findings: +----------+--------+--------+--------+------------------+--------+             PSV cm/s EDV cm/s Stenosis Plaque Description Comments  +----------+--------+--------+--------+------------------+--------+  CCA Prox   75       13                                             +----------+--------+--------+--------+------------------+--------+  CCA Mid    87       16                                             +----------+--------+--------+--------+------------------+--------+  CCA Distal 79       16                                             +----------+--------+--------+--------+------------------+--------+  ICA Prox   66       25                                             +----------+--------+--------+--------+------------------+--------+  ICA Mid    65       26                                             +----------+--------+--------+--------+------------------+--------+  ICA Distal 79       24                                             +----------+--------+--------+--------+------------------+--------+  ECA        139      17                                             +----------+--------+--------+--------+------------------+--------+ +----------+--------+--------+----------------+-------------------+             PSV  cm/s EDV cm/s Describe         Arm Pressure (mmHG)  +----------+--------+--------+----------------+-------------------+  Subclavian 134               Multiphasic, WNL                      +----------+--------+--------+----------------+-------------------+ +---------+--------+--+--------+--+---------+  Vertebral PSV cm/s 38 EDV cm/s 10 Antegrade  +---------+--------+--+--------+--+---------+      Summary: Right Carotid: Velocities in the right ICA are consistent with a 1-39% stenosis.                Non-hemodynamically significant plaque <50% noted in the CCA.                Patent ICA stent. Left Carotid: Velocities in the left ICA are consistent with a 1-39% stenosis.               Non-hemodynamically significant plaque <50% noted in the CCA. Mild               calcified plaque throughout. Vertebrals:  Bilateral vertebral arteries demonstrate antegrade flow. Subclavians: Normal flow hemodynamics were seen in bilateral subclavian              arteries. *See table(s) above for measurements and observations.  Electronically signed by Leotis Pain MD on 12/18/2021 at 10:49:39 AM.    Final        ASSESSMENT & PLAN:  1. Encounter for antineoplastic chemotherapy   2. Antineoplastic chemotherapy induced anemia   3. Malignant neoplasm of overlapping sites of left breast in female, estrogen receptor positive ([place])   4. Hypokalemia   5. Hypomagnesemia   6. Weight loss    Cancer Staging  Invasive carcinoma of breast ([place]) Staging form: Breast, AJCC 8th Edition - Clinical stage from 05/16/2021: Stage IIB (cT3, cN0, cM0, G3, ER+, PR+, HER2-) - Signed by [name], MD on 06/03/2021  #Left breast invasive carcinoma, multiple sites.  ER 90%, PR 11-50% positive, HER2 negative by IHC cT3 N0 On neoadjuvant chemotherapy, finished  4 cycles of ddAC Interval MRI showed stable size. On weekly Taxol.  Labs are reviewed and discussed with patient.  Proceed withTaxol 65 mg/m2  #Weight loss, improving.  Continue nutrition  supplementation., follow up with dietitian.  Advised patient to continue take dexamethasone daily for up to 3 days days after each chemotherapy treatments.  #Hypomagnesia, magnesium 1.7 Continue Slow-Mag to 1 tablet twice daily. #Chemotherapy-induced anemia, hemoglobin decreased at 10.7.  Close monitor weekly. #Hypokalemia, potassium is stable 3.9,.  Continue  potassium chloride to 20-meq twice daily.  Follow up follow-up in 1 week for Taxol treatment. All questions were answered. The patient knows to call the clinic with any problems questions or concerns.  cc McLean-Scocuzza, Olam Idler, MD, PhD 12/20/2021

## 2021-12-20 NOTE — Progress Notes (Signed)
Pt and caregiver in for follow up, pt denies any difficulties or concerns today.

## 2021-12-24 DIAGNOSIS — I10 Essential (primary) hypertension: Secondary | ICD-10-CM | POA: Diagnosis not present

## 2021-12-24 DIAGNOSIS — E785 Hyperlipidemia, unspecified: Secondary | ICD-10-CM | POA: Diagnosis not present

## 2021-12-24 DIAGNOSIS — E1165 Type 2 diabetes mellitus with hyperglycemia: Secondary | ICD-10-CM

## 2021-12-26 ENCOUNTER — Other Ambulatory Visit: Payer: Self-pay | Admitting: *Deleted

## 2021-12-26 DIAGNOSIS — Z5111 Encounter for antineoplastic chemotherapy: Secondary | ICD-10-CM

## 2021-12-26 DIAGNOSIS — C50912 Malignant neoplasm of unspecified site of left female breast: Secondary | ICD-10-CM

## 2021-12-27 ENCOUNTER — Telehealth: Payer: Self-pay | Admitting: Internal Medicine

## 2021-12-27 DIAGNOSIS — E876 Hypokalemia: Secondary | ICD-10-CM

## 2021-12-27 NOTE — Telephone Encounter (Signed)
Patient is needing a medication refill for  potassium chloride SA (KLOR-CON M20) 20 MEQ tablet    Patient uses [facility] on [address]

## 2021-12-30 ENCOUNTER — Other Ambulatory Visit: Payer: Self-pay | Admitting: Internal Medicine

## 2021-12-30 DIAGNOSIS — E876 Hypokalemia: Secondary | ICD-10-CM

## 2021-12-30 MED ORDER — POTASSIUM CHLORIDE CRYS ER 20 MEQ PO TBCR: 20.0000 meq | EXTENDED_RELEASE_TABLET | Freq: Every day | ORAL | 3 refills | Status: DC

## 2021-12-30 NOTE — Addendum Note (Signed)
Addended by: Thressa Sheller on: 12/30/2021 11:37 AM   Modules accepted: Orders

## 2021-12-30 NOTE — Telephone Encounter (Signed)
Medication sent in to preferred pharmacy per protocol.   

## 2021-12-31 ENCOUNTER — Telehealth: Payer: Self-pay | Admitting: Internal Medicine

## 2021-12-31 DIAGNOSIS — E876 Hypokalemia: Secondary | ICD-10-CM

## 2021-12-31 NOTE — Telephone Encounter (Signed)
[facility] [address], called. They need Clarification on patient's  potassium chloride SA (KLOR-CON M20) 20 MEQ tablet.

## 2022-01-03 ENCOUNTER — Other Ambulatory Visit: Payer: Self-pay

## 2022-01-03 ENCOUNTER — Inpatient Hospital Stay: Payer: PPO

## 2022-01-03 ENCOUNTER — Encounter: Payer: Self-pay | Admitting: Oncology

## 2022-01-03 ENCOUNTER — Inpatient Hospital Stay: Payer: PPO | Attending: Oncology

## 2022-01-03 ENCOUNTER — Inpatient Hospital Stay (HOSPITAL_BASED_OUTPATIENT_CLINIC_OR_DEPARTMENT_OTHER): Payer: PPO | Admitting: Oncology

## 2022-01-03 VITALS — BP 128/66 | HR 79 | Temp 97.8°F | Resp 18 | Wt 136.7 lb

## 2022-01-03 DIAGNOSIS — E119 Type 2 diabetes mellitus without complications: Secondary | ICD-10-CM | POA: Insufficient documentation

## 2022-01-03 DIAGNOSIS — I2510 Atherosclerotic heart disease of native coronary artery without angina pectoris: Secondary | ICD-10-CM | POA: Diagnosis not present

## 2022-01-03 DIAGNOSIS — Z8673 Personal history of transient ischemic attack (TIA), and cerebral infarction without residual deficits: Secondary | ICD-10-CM | POA: Insufficient documentation

## 2022-01-03 DIAGNOSIS — Z5111 Encounter for antineoplastic chemotherapy: Secondary | ICD-10-CM

## 2022-01-03 DIAGNOSIS — Z87891 Personal history of nicotine dependence: Secondary | ICD-10-CM | POA: Insufficient documentation

## 2022-01-03 DIAGNOSIS — D6481 Anemia due to antineoplastic chemotherapy: Secondary | ICD-10-CM | POA: Diagnosis not present

## 2022-01-03 DIAGNOSIS — E876 Hypokalemia: Secondary | ICD-10-CM | POA: Insufficient documentation

## 2022-01-03 DIAGNOSIS — Z79899 Other long term (current) drug therapy: Secondary | ICD-10-CM | POA: Insufficient documentation

## 2022-01-03 DIAGNOSIS — C50812 Malignant neoplasm of overlapping sites of left female breast: Secondary | ICD-10-CM

## 2022-01-03 DIAGNOSIS — I10 Essential (primary) hypertension: Secondary | ICD-10-CM | POA: Insufficient documentation

## 2022-01-03 DIAGNOSIS — Z170 Estrogen receptor positive status [ER+]: Secondary | ICD-10-CM

## 2022-01-03 DIAGNOSIS — R634 Abnormal weight loss: Secondary | ICD-10-CM | POA: Insufficient documentation

## 2022-01-03 DIAGNOSIS — Z7982 Long term (current) use of aspirin: Secondary | ICD-10-CM | POA: Insufficient documentation

## 2022-01-03 DIAGNOSIS — T451X5A Adverse effect of antineoplastic and immunosuppressive drugs, initial encounter: Secondary | ICD-10-CM

## 2022-01-03 DIAGNOSIS — Z7984 Long term (current) use of oral hypoglycemic drugs: Secondary | ICD-10-CM | POA: Diagnosis not present

## 2022-01-03 DIAGNOSIS — C50912 Malignant neoplasm of unspecified site of left female breast: Secondary | ICD-10-CM

## 2022-01-03 DIAGNOSIS — Z7952 Long term (current) use of systemic steroids: Secondary | ICD-10-CM | POA: Insufficient documentation

## 2022-01-03 LAB — CBC WITH DIFFERENTIAL/PLATELET
Abs Immature Granulocytes: 0.07 10*3/uL (ref 0.00–0.07)
Basophils Absolute: 0 10*3/uL (ref 0.0–0.1)
Basophils Relative: 0 %
Eosinophils Absolute: 0.1 10*3/uL (ref 0.0–0.5)
Eosinophils Relative: 1 %
HCT: 34.4 % — ABNORMAL LOW (ref 36.0–46.0)
Hemoglobin: 10.9 g/dL — ABNORMAL LOW (ref 12.0–15.0)
Immature Granulocytes: 2 %
Lymphocytes Relative: 25 %
Lymphs Abs: 1.2 10*3/uL (ref 0.7–4.0)
MCH: 30.1 pg (ref 26.0–34.0)
MCHC: 31.7 g/dL (ref 30.0–36.0)
MCV: 95 fL (ref 80.0–100.0)
Monocytes Absolute: 1.1 10*3/uL — ABNORMAL HIGH (ref 0.1–1.0)
Monocytes Relative: 24 %
Neutro Abs: 2.3 10*3/uL (ref 1.7–7.7)
Neutrophils Relative %: 48 %
Platelets: 180 10*3/uL (ref 150–400)
RBC: 3.62 MIL/uL — ABNORMAL LOW (ref 3.87–5.11)
RDW: 17.4 % — ABNORMAL HIGH (ref 11.5–15.5)
WBC: 4.7 10*3/uL (ref 4.0–10.5)
nRBC: 0.4 % — ABNORMAL HIGH (ref 0.0–0.2)

## 2022-01-03 LAB — COMPREHENSIVE METABOLIC PANEL
ALT: 19 U/L (ref 0–44)
AST: 19 U/L (ref 15–41)
Albumin: 3.6 g/dL (ref 3.5–5.0)
Alkaline Phosphatase: 45 U/L (ref 38–126)
Anion gap: 7 (ref 5–15)
BUN: 11 mg/dL (ref 8–23)
CO2: 21 mmol/L — ABNORMAL LOW (ref 22–32)
Calcium: 9.2 mg/dL (ref 8.9–10.3)
Chloride: 107 mmol/L (ref 98–111)
Creatinine, Ser: 0.71 mg/dL (ref 0.44–1.00)
GFR, Estimated: >60 mL/min (ref >60)
Glucose, Bld: 166 mg/dL — ABNORMAL HIGH (ref 70–99)
Potassium: 3.7 mmol/L (ref 3.5–5.1)
Sodium: 135 mmol/L (ref 135–145)
Total Bilirubin: 0.2 mg/dL — ABNORMAL LOW (ref 0.3–1.2)
Total Protein: 6.5 g/dL (ref 6.5–8.1)

## 2022-01-03 LAB — MAGNESIUM: Magnesium: 1.8 mg/dL (ref 1.7–2.4)

## 2022-01-03 MED ORDER — HEPARIN SOD (PORK) LOCK FLUSH 100 UNIT/ML IV SOLN
500.0000 [IU] | Freq: Once | INTRAVENOUS | Status: AC | PRN
  Administered 2022-01-03: 500 [IU]
  Filled 2022-01-03: qty 5

## 2022-01-03 MED ORDER — SODIUM CHLORIDE 0.9 % IV SOLN
65.0000 mg/m2 | Freq: Once | INTRAVENOUS | Status: AC
  Administered 2022-01-03: 102 mg via INTRAVENOUS
  Filled 2022-01-03: qty 17

## 2022-01-03 MED ORDER — FAMOTIDINE IN NACL 20-0.9 MG/50ML-% IV SOLN
20.0000 mg | Freq: Once | INTRAVENOUS | Status: AC
  Administered 2022-01-03: 20 mg via INTRAVENOUS
  Filled 2022-01-03: qty 50

## 2022-01-03 MED ORDER — SODIUM CHLORIDE 0.9 % IV SOLN
Freq: Once | INTRAVENOUS | Status: AC
  Filled 2022-01-03: qty 250

## 2022-01-03 MED ORDER — POTASSIUM CHLORIDE CRYS ER 20 MEQ PO TBCR: 20.0000 meq | EXTENDED_RELEASE_TABLET | Freq: Every day | ORAL | 3 refills | Status: DC

## 2022-01-03 MED ORDER — DIPHENHYDRAMINE HCL 50 MG/ML IJ SOLN
50.0000 mg | Freq: Once | INTRAMUSCULAR | Status: AC
  Administered 2022-01-03: 50 mg via INTRAVENOUS
  Filled 2022-01-03: qty 1

## 2022-01-03 MED ORDER — SODIUM CHLORIDE 0.9 % IV SOLN
10.0000 mg | Freq: Once | INTRAVENOUS | Status: AC
  Administered 2022-01-03: 10 mg via INTRAVENOUS
  Filled 2022-01-03: qty 10

## 2022-01-03 NOTE — Progress Notes (Signed)
Pt here for follow up. No new concerns voiced.   

## 2022-01-03 NOTE — Telephone Encounter (Signed)
No answer, no voicemail when calling the pharmacy. Phone continued to ring. Resent medication to pharmacy and ensured the signature was clear.

## 2022-01-03 NOTE — Addendum Note (Signed)
Addended by: Thressa Sheller on: 01/03/2022 11:32 AM   Modules accepted: Orders

## 2022-01-03 NOTE — Patient Instructions (Signed)
[facility] AT [place]  Discharge Instructions: Thank you for choosing [facility] to provide your oncology and hematology care.  If you have a lab appointment with the [facility], please go directly to the [facility] and check in at the registration area.  Wear comfortable clothing and clothing appropriate for easy access to any Portacath or PICC line.   We strive to give you quality time with your provider. You may need to reschedule your appointment if you arrive late (15 or more minutes).  Arriving late affects you and other patients whose appointments are after yours.  Also, if you miss three or more appointments without notifying the office, you may be dismissed from the clinic at the providers discretion.      For prescription refill requests, have your pharmacy contact our office and allow 72 hours for refills to be completed.    Today you received the following chemotherapy and/or immunotherapy agents : taxol     To help prevent nausea and vomiting after your treatment, we encourage you to take your nausea medication as directed.  BELOW ARE SYMPTOMS THAT SHOULD BE REPORTED IMMEDIATELY: *FEVER GREATER THAN 100.4 F (38 C) OR HIGHER *CHILLS OR SWEATING *NAUSEA AND VOMITING THAT IS NOT CONTROLLED WITH YOUR NAUSEA MEDICATION *UNUSUAL SHORTNESS OF BREATH *UNUSUAL BRUISING OR BLEEDING *URINARY PROBLEMS (pain or burning when urinating, or frequent urination) *BOWEL PROBLEMS (unusual diarrhea, constipation, pain near the anus) TENDERNESS IN MOUTH AND THROAT WITH OR WITHOUT PRESENCE OF ULCERS (sore throat, sores in mouth, or a toothache) UNUSUAL RASH, SWELLING OR PAIN  UNUSUAL VAGINAL DISCHARGE OR ITCHING   Items with * indicate a potential emergency and should be followed up as soon as possible or go to the Emergency Department if any problems should occur.  Please show the CHEMOTHERAPY ALERT CARD or IMMUNOTHERAPY ALERT CARD at check-in to the  Emergency Department and triage nurse.  Should you have questions after your visit or need to cancel or reschedule your appointment, please contact [facility] [place] [facility] [place]  [phone] and follow the prompts.  Office hours are 8:00 a.m. to 4:30 p.m. Monday - Friday. Please note that voicemails left after 4:00 p.m. may not be returned until the following business day.  We are closed weekends and major holidays. You have access to a nurse at all times for urgent questions. Please call the main number to the clinic [phone] and follow the prompts.  For any non-urgent questions, you may also contact your provider using MyChart. We now offer e-Visits for anyone 35 and older to request care online for non-urgent symptoms. For details visit [email or web address].   Also download the MyChart app! Go to the app store, search "MyChart", open the app, select [facility], and log in with your MyChart username and password.  Due to Covid, a mask is required upon entering the hospital/clinic. If you do not have a mask, one will be given to you upon arrival. For doctor visits, patients may have 1 support person aged 14 or older with them. For treatment visits, patients cannot have anyone with them due to current Covid guidelines and our immunocompromised population.

## 2022-01-03 NOTE — Progress Notes (Signed)
Hematology/Oncology Progress note [phone]    Patient Care Team: McLean-Scocuzza, Nino Glow, MD as PCP - General (Internal Medicine) De Hollingshead, [place] as Pharmacist (Pharmacist) Theodore Demark, RN as Oncology Nurse Navigator  REFERRING PROVIDER: McLean-Scocuzza, Olivia Mackie *  CHIEF COMPLAINTS/REASON FOR VISIT:  multifocal left breast cancer  HISTORY OF PRESENTING ILLNESS:   Tammy Nunez is a  71 y.o.  female with PMH listed below was seen in consultation at the request of  McLean-Scocuzza, Olivia Mackie *  for evaluation of multifocal left breast cancer.   04/24/2021, screening mammogram showed large asymmetry in the superior aspect of the breast with associated calcifications and possible masses and a subtle distortions.  No suspicious findings for malignancy right breast.  05/02/2021 left breast diagnostic mammogram and ultrasound showed multiple irregular mass in the left breast. Mammogram mass 1 irregular mass associated with distortion in the upper breast at middle depth, multiple adjacent and possible continuous irregular masses, conglomeration of masses measures at least 5.6 cm.  Associated pleomorphic calcifications extending at least 5 x 3.9 x 3.7 cm Mass 2, 1 cm mass in the upper breast at the middle to posterior depth. Mass 3, 5 mm irregular mass in the upper breast at posterior depth. In total, mass 1-mass 3 span at least 8.2 cm in anterior-posterior extent.  By ultrasound  mass 1 12:00 5 cm from nipple, 2.7 x 1.5 x 4.3 cm Mass 2, 12:00 12 cm from nipple, 0.9 x 0.9 x 0.5 cm Mass 3   12:00 14 cm from nipple, 0.4 x 0.4 x 0.4 cm-uses 2.4 cm superior to mass to Mass 4   11:00  No suspicious left axillary adenopathy.   05/07/2021 -Ultrasound-guided breast biopsy Breast left 12:00 mass 5 cm from nipple biopsy showed invasive mammary carcinoma, no special type, grade 3, high-grade DCIS present, no LVI, ER 90%, PR 11-50%, HER2 negative by IHC Breast left 11:00  mass 10 cm from nipple biopsy showed invasive mammary carcinoma, no special type, grade 2, no DCIS/LVI identified.ER 90%, PR 11-50%, HER2 negative by IHC  Patient is married has no children.  Denies any hormone replacement, oral contraceptive use, family history of breast cancer.  Denies any chest radiation. Patient has a history of stroke with chronic residual weakness of the left side.  She walks with a cane.  05/22/2021 MRI breast showed that nonmass like malignancy spanning over 8 cm, abuting anterior aspect of pectoralis muscle. Discussed with Dr.Cintron and we agree that she will need mastectomy. There is concern of possible positive margin. I recommend neoadjuvant chemotherapy  # medi port placed on 06/06/2021. 06/13/2021 pre chemotherapy Echo. LVEF 60-65%.  Normal global longitudinal strain. Grade 1 diastolic dysfunction.   # 05/22/2021 MRI breast showed  Biopsy-proven malignancy within the UPPER LEFT breast spanning a distance of 8.3 x 4.2 x 4 cm nonmass like enhancement abuts the anterior aspect of the LEFT pectoralis muscle without gross invasion. Oncotype Dx 27. Discussed with Dr.Cintron, there is concern of positive margin.   #Chemotherapy 06/21/2021 - 08/16/2021 ddAC x4 with G-CSF support. 08/30/2021 - 09/06/2021, weekly Taxol. Chemotherapy break due to side effects, UTI and the patient's preference. 10/25/2021  INTERVAL HISTORY Tammy Nunez is a 71 y.o. female who has above history reviewed by me today presents for follow up visit for chemotherapy of breast cancer Patient was accompanied by her husband. Patient denies any numbness tingling symptoms.  Appetite has improved after using dexamethasone for 1 to 2 days after each chemotherapy treatments.  She has  gained weight. Denies any nausea vomiting diarrhea.   Review of Systems  Constitutional:  Positive for fatigue. Negative for appetite change, chills and fever.  HENT:   Negative for hearing loss and voice change.   Eyes:   Negative for eye problems.  Respiratory:  Negative for chest tightness and cough.   Cardiovascular:  Negative for chest pain.  Gastrointestinal:  Negative for abdominal distention, abdominal pain, blood in stool, diarrhea and nausea.  Endocrine: Negative for hot flashes.  Genitourinary:  Negative for difficulty urinating, dysuria and frequency.   Musculoskeletal:  Negative for arthralgias.  Skin:  Negative for itching and rash.  Neurological:  Negative for extremity weakness.  Hematological:  Negative for adenopathy. Does not bruise/bleed easily.  Psychiatric/Behavioral:  Negative for confusion.    MEDICAL HISTORY:  Past Medical History:  Diagnosis Date   Breast cancer in female [facility]) 05/08/2021   Chronic left shoulder pain    Coronary artery disease    Diabetes mellitus without complication (HCC)    Dysrhythmia    Hypertension    Hypomagnesemia 09/13/2021   Paralysis (HCC)    weakness left upper amd lower extremity   PONV (postoperative nausea and vomiting)    Stroke [facility])     SURGICAL HISTORY: Past Surgical History:  Procedure Laterality Date   BREAST BIOPSY Left 05/07/2021   12:00 5cmfn vision clip path pending   BREAST BIOPSY Left 05/07/2021   11:00 10 cmfn mini cork clip path pending   CAROTID PTA/STENT INTERVENTION Left 07/20/2019   Procedure: CAROTID PTA/STENT INTERVENTION;  Surgeon: Renford Dills, MD;  Location: [facility] INVASIVE CV LAB;  Service: Cardiovascular;  Laterality: Left;   ECTOPIC PREGNANCY SURGERY     PORTACATH PLACEMENT N/A 06/06/2021   Procedure: INSERTION PORT-A-CATH;  Surgeon: Carolan Shiver, MD;  Location: [facility] ORS;  Service: General;  Laterality: N/A;    SOCIAL HISTORY: Social History   Socioeconomic History   Marital status: Married    Spouse name: Not on file   Number of children: Not on file   Years of education: Not on file   Highest education level: Not on file  Occupational History   Not on file  Tobacco Use   Smoking  status: Former    Packs/day: 0.50    Years: 15.00    Pack years: 7.50    Types: Cigarettes    Quit date: 04/11/2019    Years since quitting: 2.7   Smokeless tobacco: Never  Vaping Use   Vaping Use: Never used  Substance and Sexual Activity   Alcohol use: Never   Drug use: Never   Sexual activity: Not Currently  Other Topics Concern   Not on file  Social History Narrative   No kids    Married with husband    Used to work [employer], dispensing    Social Determinants of Corporate investment banker Strain: Low Risk    Difficulty of Paying Living Expenses: Not hard at all  Food Insecurity: Not on file  Transportation Needs: Not on file  Physical Activity: Not on file  Stress: Not on file  Social Connections: Not on file  Intimate Partner Violence: Not on file    FAMILY HISTORY: Family History  Problem Relation Age of Onset   Diabetes type II Mother    Hypertension Father    Heart attack Father    Diabetes type II Brother    Breast cancer Neg Hx     ALLERGIES:  has No Known Allergies.  MEDICATIONS:  Current Outpatient Medications  Medication Sig Dispense Refill   amLODipine (NORVASC) 10 MG tablet Take 1 tablet (10 mg total) by mouth daily. 90 tablet 3   aspirin 81 MG EC tablet Take 1 tablet (81 mg total) by mouth daily. 90 tablet 3   atorvastatin (LIPITOR) 80 MG tablet Take 1 tablet (80 mg total) by mouth daily. 90 tablet 3   blood glucose meter kit and supplies 1 each by Other route 4 (four) times daily. Dispense based on patient and insurance preference. Four times daily as directed. (FOR ICD-10 E10.9, E11.9). 1 each 0   carvedilol (COREG) 25 MG tablet Take 1 tablet (25 mg total) by mouth 2 (two) times daily with a meal. 180 tablet 3   clopidogrel (PLAVIX) 75 MG tablet Take 1 tablet (75 mg total) by mouth daily. Further refills [facility] 90 tablet 3   Continuous Blood Gluc Sensor (FREESTYLE LIBRE 2 SENSOR) MISC USE TO CHECK GLUCOSE AT LEAST EVERY 8  HOURS 6 each 0   dexamethasone (DECADRON) 2 MG tablet Take 1 tablet (2 mg total) by mouth See admin instructions. Take 31m daily for 2 days after each chemotherapy. 10 tablet 0   empagliflozin (JARDIANCE) 25 MG TABS tablet Take 1 tablet (25 mg total) by mouth daily. 90 tablet 3   lidocaine-prilocaine (EMLA) cream Apply to affected area once 30 g 3   lisinopril (ZESTRIL) 40 MG tablet Take 1 tablet (40 mg total) by mouth daily. 90 tablet 3   loperamide (IMODIUM) 2 MG capsule Take 1 capsule (2 mg total) by mouth See admin instructions. Take 46mat the onset of diarrhea, and then 31m38mfter each loose bowel movement, maximum 87m4mr 24 hours. 30 capsule 1   loratadine (CLARITIN) 10 MG tablet Take 1 tablet (10 mg total) by mouth daily. 90 tablet 3   magnesium chloride (SLOW-MAG) 64 MG TBEC SR tablet Take 1 tablet (64 mg total) by mouth daily. 30 tablet 1   Multiple Vitamin (MULTIVITAMIN) tablet Take 1 tablet by mouth daily.     ondansetron (ZOFRAN) 8 MG tablet Take 1 tablet (8 mg total) by mouth 2 (two) times daily as needed. Start on the third day after chemotherapy. 30 tablet 1   prochlorperazine (COMPAZINE) 10 MG tablet Take 1 tablet (10 mg total) by mouth every 6 (six) hours as needed (Nausea or vomiting). 30 tablet 1   vitamin B-12 (CYANOCOBALAMIN) 1000 MCG tablet Take 1 tablet (1,000 mcg total) by mouth daily. 30 tablet 1   Vitamin D, Cholecalciferol, 50 MCG (2000 UT) CAPS Take 2,000 Units by mouth.     potassium chloride SA (KLOR-CON M20) 20 MEQ tablet Take 1 tablet (20 mEq total) by mouth daily. 90 tablet 3   No current facility-administered medications for this visit.   Facility-Administered Medications Ordered in Other Visits  Medication Dose Route Frequency Provider Last Rate Last Admin   heparin lock flush 100 UNIT/ML injection              PHYSICAL EXAMINATION: ECOG PERFORMANCE STATUS: 2 - Symptomatic, <50% confined to bed Vitals:   01/03/22 0813  BP: 128/66  Pulse: 79  Resp: 18   Temp: 97.8 F (36.6 C)   Filed Weights   01/03/22 0813  Weight: 136 lb 11.2 oz (62 kg)     Physical Exam Constitutional:      General: She is not in acute distress. HENT:     Head: Normocephalic and atraumatic.  Eyes:  General: No scleral icterus. Cardiovascular:     Rate and Rhythm: Normal rate and regular rhythm.     Heart sounds: Normal heart sounds.  Pulmonary:     Effort: Pulmonary effort is normal. No respiratory distress.     Breath sounds: No wheezing.  Abdominal:     General: Bowel sounds are normal. There is no distension.     Palpations: Abdomen is soft.  Musculoskeletal:        General: No deformity. Normal range of motion.     Cervical back: Normal range of motion and neck supple.  Skin:    General: Skin is warm and dry.     Findings: No erythema or rash.  Neurological:     Mental Status: She is alert and oriented to person, place, and time. Mental status is at baseline.     Comments: Chronic left upper and lower extremity weakness.  Psychiatric:        Mood and Affect: Mood normal.  Left breast mass palpable, similar size.    LABORATORY DATA:  I have reviewed the data as listed Lab Results  Component Value Date   WBC 4.7 01/03/2022   HGB 10.9 (L) 01/03/2022   HCT 34.4 (L) 01/03/2022   MCV 95.0 01/03/2022   PLT 180 01/03/2022   Recent Labs    12/13/21 0813 12/20/21 0820 01/03/22 0758  NA 140 139 135  K 3.6 3.9 3.7  CL 111 109 107  CO2 22 21* 21*  GLUCOSE 173* 156* 166*  BUN _0 CREATININE 0.68 0.72 0.71  CALCIUM 9.6 9.3 9.2  GFRNONAA >60 >60 >60  PROT 6.2* 6.5 6.5  ALBUMIN 3.9 3.7 3.6  AST _1 ALT _2 ALKPHOS 40 41 45  BILITOT 0.1* 0.5 0.2*    Iron/TIBC/Ferritin/ %Sat    Component Value Date/Time   IRON 113 12/08/2019 1015   TIBC 317 12/08/2019 1015   FERRITIN 67 12/08/2019 1015   IRONPCTSAT 36 12/08/2019 1015       RADIOGRAPHIC STUDIES: I have personally reviewed the radiological images as listed  and agreed with the findings in the report. VAS US CAROTID  Result Date: 12/18/2021 Carotid Arterial Duplex Study Patient Name:  Tammy Nunez  Date of Exam:   12/12/2021 Medical Rec #: [id number]        Accession #:    [id number] Date of Birth: 08/03/1951        Patient Gender: F Patient Age:   39 years Exam Location:  [facility] Procedure:      VAS US CAROTID Referring Phys: Leotis Pain --------------------------------------------------------------------------------  Indications:       Carotid artery disease and Right stent. Comparison Study:  10/2020 Performing Technologist: Concha Norway RVT  Examination Guidelines: A complete evaluation includes B-mode imaging, spectral Doppler, color Doppler, and power Doppler as needed of all accessible portions of each vessel. Bilateral testing is considered an integral part of a complete examination. Limited examinations for reoccurring indications may be performed as noted.  Right Carotid Findings: +----------+--------+--------+--------+------------------+--------+             PSV cm/s EDV cm/s Stenosis Plaque Description Comments  +----------+--------+--------+--------+------------------+--------+  CCA Prox   62       14                                             +----------+--------+--------+--------+------------------+--------+  CCA Mid    75       16                                             +----------+--------+--------+--------+------------------+--------+  CCA Distal 74       20                                   stent     +----------+--------+--------+--------+------------------+--------+  ICA Prox   70       17       1-39%                       stent     +----------+--------+--------+--------+------------------+--------+  ICA Mid    80       19                                             +----------+--------+--------+--------+------------------+--------+  ICA Distal 84       22                                              +----------+--------+--------+--------+------------------+--------+  ECA        74       16                                             +----------+--------+--------+--------+------------------+--------+ +----------+--------+-------+----------------+-------------------+             PSV cm/s EDV cms Describe         Arm Pressure (mmHG)  +----------+--------+-------+----------------+-------------------+  Subclavian 104              Multiphasic, WNL                      +----------+--------+-------+----------------+-------------------+ +---------+--------+--+--------+--+---------+  Vertebral PSV cm/s 94 EDV cm/s 24 Antegrade  +---------+--------+--+--------+--+---------+     Left Carotid Findings: +----------+--------+--------+--------+------------------+--------+             PSV cm/s EDV cm/s Stenosis Plaque Description Comments  +----------+--------+--------+--------+------------------+--------+  CCA Prox   75       13                                             +----------+--------+--------+--------+------------------+--------+  CCA Mid    87       16                                             +----------+--------+--------+--------+------------------+--------+  CCA Distal 79       16                                             +----------+--------+--------+--------+------------------+--------+  ICA Prox   66       25                                             +----------+--------+--------+--------+------------------+--------+  ICA Mid    65       26                                             +----------+--------+--------+--------+------------------+--------+  ICA Distal 79       24                                             +----------+--------+--------+--------+------------------+--------+  ECA        139      17                                             +----------+--------+--------+--------+------------------+--------+ +----------+--------+--------+----------------+-------------------+             PSV  cm/s EDV cm/s Describe         Arm Pressure (mmHG)  +----------+--------+--------+----------------+-------------------+  Subclavian 134               Multiphasic, WNL                      +----------+--------+--------+----------------+-------------------+ +---------+--------+--+--------+--+---------+  Vertebral PSV cm/s 38 EDV cm/s 10 Antegrade  +---------+--------+--+--------+--+---------+      Summary: Right Carotid: Velocities in the right ICA are consistent with a 1-39% stenosis.                Non-hemodynamically significant plaque <50% noted in the CCA.                Patent ICA stent. Left Carotid: Velocities in the left ICA are consistent with a 1-39% stenosis.               Non-hemodynamically significant plaque <50% noted in the CCA. Mild               calcified plaque throughout. Vertebrals:  Bilateral vertebral arteries demonstrate antegrade flow. Subclavians: Normal flow hemodynamics were seen in bilateral subclavian              arteries. *See table(s) above for measurements and observations.  Electronically signed by Leotis Pain MD on 12/18/2021 at 10:49:39 AM.    Final        ASSESSMENT & PLAN:  1. Malignant neoplasm of overlapping sites of left breast in female, estrogen receptor positive ([place])   2. Antineoplastic chemotherapy induced anemia   3. Encounter for antineoplastic chemotherapy   4. Hypokalemia   5. Hypomagnesemia    Cancer Staging  Invasive carcinoma of breast ([place]) Staging form: Breast, AJCC 8th Edition - Clinical stage from 05/16/2021: Stage IIB (cT3, cN0, cM0, G3, ER+, PR+, HER2-) - Signed by [name], MD on 06/03/2021  #Left breast invasive carcinoma, multiple sites.  ER 90%, PR 11-50% positive, HER2 negative by IHC cT3 N0 On neoadjuvant chemotherapy, finished 4 cycles of ddAC Interval  MRI showed stable size. On weekly Taxol.  Labs reviewed and discussed with patient. Proceed with Taxol 65 mg/m2   #Weight loss, improving.  Continue nutrition supplements.., follow up  with dietitian.  Advised patient to continue take dexamethasone daily for up to 3 days days after each chemotherapy treatments.  #Hypomagnesia, improved.  Magnesium 1.8 Continue Slow-Mag to 1 tablet twice daily.  #Chemotherapy-induced anemia, hemoglobin decreased at 10.9.  Close monitor weekly. #Hypokalemia, potassium is stable 3.7,.  Continue potassium chloride to 20-meq twice daily.  Follow up follow-up in 1 week for Taxol treatment. All questions were answered. The patient knows to call the clinic with any problems questions or concerns.  cc McLean-Scocuzza, Olam Idler, MD, PhD 01/03/2022

## 2022-01-10 ENCOUNTER — Telehealth: Payer: Self-pay

## 2022-01-10 ENCOUNTER — Inpatient Hospital Stay: Payer: PPO

## 2022-01-10 ENCOUNTER — Inpatient Hospital Stay: Payer: PPO | Admitting: Oncology

## 2022-01-10 NOTE — Telephone Encounter (Signed)
-----   Message from Secundino Ginger sent at 01/10/2022  9:16 AM EST ----- Regarding: reschedule Good Morn ladies! This patient left a msg with answering service to cancel her appt today and call to reschedule

## 2022-01-10 NOTE — Telephone Encounter (Signed)
Please call to r/s today's appts.

## 2022-01-10 NOTE — Telephone Encounter (Signed)
Spoke to pt, she has been rescheduled.

## 2022-01-13 ENCOUNTER — Other Ambulatory Visit: Payer: Self-pay | Admitting: Internal Medicine

## 2022-01-13 DIAGNOSIS — E1165 Type 2 diabetes mellitus with hyperglycemia: Secondary | ICD-10-CM

## 2022-01-14 ENCOUNTER — Inpatient Hospital Stay: Payer: PPO

## 2022-01-14 ENCOUNTER — Inpatient Hospital Stay (HOSPITAL_BASED_OUTPATIENT_CLINIC_OR_DEPARTMENT_OTHER): Payer: PPO | Admitting: Oncology

## 2022-01-14 ENCOUNTER — Other Ambulatory Visit: Payer: Self-pay

## 2022-01-14 ENCOUNTER — Encounter: Payer: Self-pay | Admitting: Oncology

## 2022-01-14 VITALS — BP 136/75 | HR 85

## 2022-01-14 VITALS — BP 126/60 | HR 75 | Temp 97.4°F | Resp 18 | Wt 133.0 lb

## 2022-01-14 DIAGNOSIS — Z5111 Encounter for antineoplastic chemotherapy: Secondary | ICD-10-CM

## 2022-01-14 DIAGNOSIS — C50912 Malignant neoplasm of unspecified site of left female breast: Secondary | ICD-10-CM

## 2022-01-14 DIAGNOSIS — Z170 Estrogen receptor positive status [ER+]: Secondary | ICD-10-CM

## 2022-01-14 DIAGNOSIS — E876 Hypokalemia: Secondary | ICD-10-CM

## 2022-01-14 DIAGNOSIS — D6481 Anemia due to antineoplastic chemotherapy: Secondary | ICD-10-CM

## 2022-01-14 DIAGNOSIS — T451X5A Adverse effect of antineoplastic and immunosuppressive drugs, initial encounter: Secondary | ICD-10-CM

## 2022-01-14 DIAGNOSIS — C50812 Malignant neoplasm of overlapping sites of left female breast: Secondary | ICD-10-CM

## 2022-01-14 DIAGNOSIS — R634 Abnormal weight loss: Secondary | ICD-10-CM

## 2022-01-14 LAB — CBC WITH DIFFERENTIAL/PLATELET
Abs Immature Granulocytes: 0.06 10*3/uL (ref 0.00–0.07)
Basophils Absolute: 0 10*3/uL (ref 0.0–0.1)
Basophils Relative: 1 %
Eosinophils Absolute: 0.2 10*3/uL (ref 0.0–0.5)
Eosinophils Relative: 5 %
HCT: 36.3 % (ref 36.0–46.0)
Hemoglobin: 11.6 g/dL — ABNORMAL LOW (ref 12.0–15.0)
Immature Granulocytes: 1 %
Lymphocytes Relative: 28 %
Lymphs Abs: 1.3 10*3/uL (ref 0.7–4.0)
MCH: 29.9 pg (ref 26.0–34.0)
MCHC: 32 g/dL (ref 30.0–36.0)
MCV: 93.6 fL (ref 80.0–100.0)
Monocytes Absolute: 0.4 10*3/uL (ref 0.1–1.0)
Monocytes Relative: 10 %
Neutro Abs: 2.6 10*3/uL (ref 1.7–7.7)
Neutrophils Relative %: 55 %
Platelets: 210 10*3/uL (ref 150–400)
RBC: 3.88 MIL/uL (ref 3.87–5.11)
RDW: 17.2 % — ABNORMAL HIGH (ref 11.5–15.5)
WBC: 4.6 10*3/uL (ref 4.0–10.5)
nRBC: 0.9 % — ABNORMAL HIGH (ref 0.0–0.2)

## 2022-01-14 LAB — COMPREHENSIVE METABOLIC PANEL
ALT: 17 U/L (ref 0–44)
AST: 17 U/L (ref 15–41)
Albumin: 3.9 g/dL (ref 3.5–5.0)
Alkaline Phosphatase: 39 U/L (ref 38–126)
Anion gap: 7 (ref 5–15)
BUN: 13 mg/dL (ref 8–23)
CO2: 20 mmol/L — ABNORMAL LOW (ref 22–32)
Calcium: 9.6 mg/dL (ref 8.9–10.3)
Chloride: 110 mmol/L (ref 98–111)
Creatinine, Ser: 0.81 mg/dL (ref 0.44–1.00)
GFR, Estimated: >60 mL/min (ref >60)
Glucose, Bld: 166 mg/dL — ABNORMAL HIGH (ref 70–99)
Potassium: 4 mmol/L (ref 3.5–5.1)
Sodium: 137 mmol/L (ref 135–145)
Total Bilirubin: 0.5 mg/dL (ref 0.3–1.2)
Total Protein: 6.5 g/dL (ref 6.5–8.1)

## 2022-01-14 LAB — MAGNESIUM: Magnesium: 1.8 mg/dL (ref 1.7–2.4)

## 2022-01-14 MED ORDER — DIPHENHYDRAMINE HCL 50 MG/ML IJ SOLN
50.0000 mg | Freq: Once | INTRAMUSCULAR | Status: AC
  Administered 2022-01-14: 50 mg via INTRAVENOUS
  Filled 2022-01-14: qty 1

## 2022-01-14 MED ORDER — SODIUM CHLORIDE 0.9 % IV SOLN
65.0000 mg/m2 | Freq: Once | INTRAVENOUS | Status: AC
  Administered 2022-01-14: 102 mg via INTRAVENOUS
  Filled 2022-01-14: qty 17

## 2022-01-14 MED ORDER — SODIUM CHLORIDE 0.9 % IV SOLN
Freq: Once | INTRAVENOUS | Status: AC
  Filled 2022-01-14: qty 250

## 2022-01-14 MED ORDER — FAMOTIDINE IN NACL 20-0.9 MG/50ML-% IV SOLN
20.0000 mg | Freq: Once | INTRAVENOUS | Status: AC
  Administered 2022-01-14: 20 mg via INTRAVENOUS
  Filled 2022-01-14: qty 50

## 2022-01-14 MED ORDER — HEPARIN SOD (PORK) LOCK FLUSH 100 UNIT/ML IV SOLN
500.0000 [IU] | Freq: Once | INTRAVENOUS | Status: AC | PRN
  Administered 2022-01-14: 500 [IU]
  Filled 2022-01-14: qty 5

## 2022-01-14 MED ORDER — SODIUM CHLORIDE 0.9 % IV SOLN
10.0000 mg | Freq: Once | INTRAVENOUS | Status: AC
  Administered 2022-01-14: 10 mg via INTRAVENOUS
  Filled 2022-01-14: qty 10

## 2022-01-14 NOTE — Patient Instructions (Signed)
[facility]  Discharge Instructions: ?Thank you for choosing [facility] to provide your oncology and hematology care.  ?If you have a lab appointment with the Cancer Center, please go directly to the Cancer Center and check in at the registration area. ? ?Wear comfortable clothing and clothing appropriate for easy access to any Portacath or PICC line.  ? ?We strive to give you quality time with your provider. You may need to reschedule your appointment if you arrive late (15 or more minutes).  Arriving late affects you and other patients whose appointments are after yours.  Also, if you miss three or more appointments without notifying the office, you may be dismissed from the clinic at the provider?s discretion.    ?  ?For prescription refill requests, have your pharmacy contact our office and allow 72 hours for refills to be completed.   ? ?  ?To help prevent nausea and vomiting after your treatment, we encourage you to take your nausea medication as directed. ? ?BELOW ARE SYMPTOMS THAT SHOULD BE REPORTED IMMEDIATELY: ?*FEVER GREATER THAN 100.4 F (38 ?C) OR HIGHER ?*CHILLS OR SWEATING ?*NAUSEA AND VOMITING THAT IS NOT CONTROLLED WITH YOUR NAUSEA MEDICATION ?*UNUSUAL SHORTNESS OF BREATH ?*UNUSUAL BRUISING OR BLEEDING ?*URINARY PROBLEMS (pain or burning when urinating, or frequent urination) ?*BOWEL PROBLEMS (unusual diarrhea, constipation, pain near the anus) ?TENDERNESS IN MOUTH AND THROAT WITH OR WITHOUT PRESENCE OF ULCERS (sore throat, sores in mouth, or a toothache) ?UNUSUAL RASH, SWELLING OR PAIN  ?UNUSUAL VAGINAL DISCHARGE OR ITCHING  ? ?Items with * indicate a potential emergency and should be followed up as soon as possible or go to the Emergency Department if any problems should occur. ? ?Please show the CHEMOTHERAPY ALERT CARD or IMMUNOTHERAPY ALERT CARD at check-in to the Emergency Department and triage nurse. ? ?Should you have questions after your visit  or need to cancel or reschedule your appointment, please contact [facility]  [phone] and follow the prompts.  Office hours are 8:00 a.m. to 4:30 p.m. Monday - Friday. Please note that voicemails left after 4:00 p.m. may not be returned until the following business day.  We are closed weekends and major holidays. You have access to a nurse at all times for urgent questions. Please call the main number to the clinic [phone] and follow the prompts. ? ?For any non-urgent questions, you may also contact your provider using MyChart. We now offer e-Visits for anyone 18 and older to request care online for non-urgent symptoms. For details visit [email or web address]. ?  ?Also download the MyChart app! Go to the app store, search "MyChart", open the app, select [facility], and log in with your MyChart username and password. ? ?Due to Covid, a mask is required upon entering the hospital/clinic. If you do not have a mask, one will be given to you upon arrival. For doctor visits, patients may have 1 support person aged 18 or older with them. For treatment visits, patients cannot have anyone with them due to current Covid guidelines and our immunocompromised population.  ?

## 2022-01-14 NOTE — Progress Notes (Signed)
Hematology/Oncology Progress note [phone]    Patient Care Team: McLean-Scocuzza, Nino Glow, MD as PCP - General (Internal Medicine) De Hollingshead, [place] as Pharmacist (Pharmacist) Theodore Demark, RN as Oncology Nurse Navigator  REFERRING PROVIDER: McLean-Scocuzza, Olivia Mackie *  CHIEF COMPLAINTS/REASON FOR VISIT:  multifocal left breast cancer  HISTORY OF PRESENTING ILLNESS:   Tammy Nunez is a  71 y.o.  female with PMH listed below was seen in consultation at the request of  McLean-Scocuzza, Olivia Mackie *  for evaluation of multifocal left breast cancer.   04/24/2021, screening mammogram showed large asymmetry in the superior aspect of the breast with associated calcifications and possible masses and a subtle distortions.  No suspicious findings for malignancy right breast.  05/02/2021 left breast diagnostic mammogram and ultrasound showed multiple irregular mass in the left breast. Mammogram mass 1 irregular mass associated with distortion in the upper breast at middle depth, multiple adjacent and possible continuous irregular masses, conglomeration of masses measures at least 5.6 cm.  Associated pleomorphic calcifications extending at least 5 x 3.9 x 3.7 cm Mass 2, 1 cm mass in the upper breast at the middle to posterior depth. Mass 3, 5 mm irregular mass in the upper breast at posterior depth. In total, mass 1-mass 3 span at least 8.2 cm in anterior-posterior extent.  By ultrasound  mass 1 12:00 5 cm from nipple, 2.7 x 1.5 x 4.3 cm Mass 2, 12:00 12 cm from nipple, 0.9 x 0.9 x 0.5 cm Mass 3   12:00 14 cm from nipple, 0.4 x 0.4 x 0.4 cm-uses 2.4 cm superior to mass to Mass 4   11:00  No suspicious left axillary adenopathy.   05/07/2021 -Ultrasound-guided breast biopsy Breast left 12:00 mass 5 cm from nipple biopsy showed invasive mammary carcinoma, no special type, grade 3, high-grade DCIS present, no LVI, ER 90%, PR 11-50%, HER2 negative by IHC Breast left 11:00  mass 10 cm from nipple biopsy showed invasive mammary carcinoma, no special type, grade 2, no DCIS/LVI identified.ER 90%, PR 11-50%, HER2 negative by IHC  Patient is married has no children.  Denies any hormone replacement, oral contraceptive use, family history of breast cancer.  Denies any chest radiation. Patient has a history of stroke with chronic residual weakness of the left side.  She walks with a cane.  05/22/2021 MRI breast showed that nonmass like malignancy spanning over 8 cm, abuting anterior aspect of pectoralis muscle. Discussed with Dr.Cintron and we agree that she will need mastectomy. There is concern of possible positive margin. I recommend neoadjuvant chemotherapy  # medi port placed on 06/06/2021. 06/13/2021 pre chemotherapy Echo. LVEF 60-65%.  Normal global longitudinal strain. Grade 1 diastolic dysfunction.   # 05/22/2021 MRI breast showed  Biopsy-proven malignancy within the UPPER LEFT breast spanning a distance of 8.3 x 4.2 x 4 cm nonmass like enhancement abuts the anterior aspect of the LEFT pectoralis muscle without gross invasion. Oncotype Dx 27. Discussed with Dr.Cintron, there is concern of positive margin.   #Chemotherapy 06/21/2021 - 08/16/2021 ddAC x4 with G-CSF support. 08/30/2021 - 09/06/2021, weekly Taxol. Chemotherapy break due to side effects, UTI and the patient's preference. 10/25/2021  INTERVAL HISTORY Tammy Nunez is a 71 y.o. female who has above history reviewed by me today presents for follow up visit for chemotherapy of breast cancer Patient was accompanied by her husband. Patient reports 1 day of diarrhea and she took Imodium which improved her symptoms.  Diarrhea has resolved Today she denies any nausea vomiting diarrhea.  Appetite is fair.   Review of Systems  Constitutional:  Positive for fatigue. Negative for appetite change, chills and fever.  HENT:   Negative for hearing loss and voice change.   Eyes:  Negative for eye problems.   Respiratory:  Negative for chest tightness and cough.   Cardiovascular:  Negative for chest pain.  Gastrointestinal:  Negative for abdominal distention, abdominal pain, blood in stool, diarrhea and nausea.  Endocrine: Negative for hot flashes.  Genitourinary:  Negative for difficulty urinating, dysuria and frequency.   Musculoskeletal:  Negative for arthralgias.  Skin:  Negative for itching and rash.  Neurological:  Negative for extremity weakness.  Hematological:  Negative for adenopathy. Does not bruise/bleed easily.  Psychiatric/Behavioral:  Negative for confusion.    MEDICAL HISTORY:  Past Medical History:  Diagnosis Date   Breast cancer in female [facility]) 05/08/2021   Chronic left shoulder pain    Coronary artery disease    Diabetes mellitus without complication ([place])    Dysrhythmia    Hypertension    Hypomagnesemia 09/13/2021   Paralysis ([place])    weakness left upper amd lower extremity   PONV (postoperative nausea and vomiting)    Stroke [facility])     SURGICAL HISTORY: Past Surgical History:  Procedure Laterality Date   BREAST BIOPSY Left 05/07/2021   12:00 5cmfn vision clip path pending   BREAST BIOPSY Left 05/07/2021   11:00 10 cmfn mini cork clip path pending   CAROTID PTA/STENT INTERVENTION Left 07/20/2019   Procedure: CAROTID PTA/STENT INTERVENTION;  Surgeon: Katha Cabal, MD;  Location: [facility];  Service: Cardiovascular;  Laterality: Left;   ECTOPIC PREGNANCY SURGERY     PORTACATH PLACEMENT N/A 06/06/2021   Procedure: INSERTION PORT-A-CATH;  Surgeon: Herbert Pun, MD;  Location: [facility] OR[facility];  Service: General;  Laterality: N/A;    SOCIAL HISTORY: Social History   Socioeconomic History   Marital status: Married    Spouse name: Not on file   Number of children: Not on file   Years of education: Not on file   Highest education level: Not on file  Occupational History   Not on file  Tobacco Use   Smoking status: Former    Packs/day:  0.50    Years: 15.00    Pack years: 7.50    Types: Cigarettes    Quit date: 04/11/2019    Years since quitting: 2.7   Smokeless tobacco: Never  Vaping Use   Vaping Use: Never used  Substance and Sexual Activity   Alcohol use: Never   Drug use: Never   Sexual activity: Not Currently  Other Topics Concern   Not on file  Social History Narrative   No kids    Married with husband    Used to work [employer] broadcast assistant    Social Determinants of Radio broadcast assistant Strain: Low Risk    Difficulty of Paying Living Expenses: Not hard at all  Food Insecurity: Not on file  Transportation Needs: Not on file  Physical Activity: Not on file  Stress: Not on file  Social Connections: Not on file  Intimate Partner Violence: Not on file    FAMILY HISTORY: Family History  Problem Relation Age of Onset   Diabetes type II Mother    Hypertension Father    Heart attack Father    Diabetes type II Brother    Breast cancer Neg Hx     ALLERGIES:  has No Known Allergies.  MEDICATIONS:  Current Outpatient  Medications  Medication Sig Dispense Refill   amLODipine (NORVASC) 10 MG tablet Take 1 tablet (10 mg total) by mouth daily. 90 tablet 3   aspirin 81 MG EC tablet Take 1 tablet (81 mg total) by mouth daily. 90 tablet 3   atorvastatin (LIPITOR) 80 MG tablet Take 1 tablet (80 mg total) by mouth daily. 90 tablet 3   blood glucose meter kit and supplies 1 each by Other route 4 (four) times daily. Dispense based on patient and insurance preference. Four times daily as directed. (FOR ICD-10 E10.9, E11.9). 1 each 0   carvedilol (COREG) 25 MG tablet Take 1 tablet (25 mg total) by mouth 2 (two) times daily with a meal. 180 tablet 3   clopidogrel (PLAVIX) 75 MG tablet Take 1 tablet (75 mg total) by mouth daily. Further refills [facility] neurology 90 tablet 3   Continuous Blood Gluc Sensor (FREESTYLE LIBRE 2 SENSOR) MISC USE TO CHECK BLOOD SUGAR AT LEAST EVERY 8 HOURS 6 each 0    dexamethasone (DECADRON) 2 MG tablet Take 1 tablet (2 mg total) by mouth See admin instructions. Take 25m daily for 2 days after each chemotherapy. 10 tablet 0   empagliflozin (JARDIANCE) 25 MG TABS tablet Take 1 tablet (25 mg total) by mouth daily. 90 tablet 3   lidocaine-prilocaine (EMLA) cream Apply to affected area once 30 g 3   lisinopril (ZESTRIL) 40 MG tablet Take 1 tablet (40 mg total) by mouth daily. 90 tablet 3   loperamide (IMODIUM) 2 MG capsule Take 1 capsule (2 mg total) by mouth See admin instructions. Take 446mat the onset of diarrhea, and then 6m38mfter each loose bowel movement, maximum 29m6mr 24 hours. 30 capsule 1   loratadine (CLARITIN) 10 MG tablet Take 1 tablet (10 mg total) by mouth daily. 90 tablet 3   magnesium chloride (SLOW-MAG) 64 MG TBEC SR tablet Take 1 tablet (64 mg total) by mouth daily. 30 tablet 1   Multiple Vitamin (MULTIVITAMIN) tablet Take 1 tablet by mouth daily.     ondansetron (ZOFRAN) 8 MG tablet Take 1 tablet (8 mg total) by mouth 2 (two) times daily as needed. Start on the third day after chemotherapy. 30 tablet 1   potassium chloride SA (KLOR-CON M20) 20 MEQ tablet Take 1 tablet (20 mEq total) by mouth daily. 90 tablet 3   prochlorperazine (COMPAZINE) 10 MG tablet Take 1 tablet (10 mg total) by mouth every 6 (six) hours as needed (Nausea or vomiting). 30 tablet 1   vitamin B-12 (CYANOCOBALAMIN) 1000 MCG tablet Take 1 tablet (1,000 mcg total) by mouth daily. 30 tablet 1   Vitamin D, Cholecalciferol, 50 MCG (2000 UT) CAPS Take 2,000 Units by mouth.     No current facility-administered medications for this visit.   Facility-Administered Medications Ordered in Other Visits  Medication Dose Route Frequency Provider Last Rate Last Admin   0.9 %  sodium chloride infusion   Intravenous Once Rashan Patient, [name] 500 mL/hr at 01/14/22 0908 New Bag at 01/14/22 0908   dexamethasone (DECADRON) 10 mg in sodium chloride 0.9 % 50 mL IVPB  10 mg Intravenous Once Gaynel Schaafsma, [name]  204 mL/hr at 01/14/22 0937 10 mg at 01/14/22 0937   heparin lock flush 100 UNIT/ML injection            heparin lock flush 100 unit/mL  500 Units Intracatheter Once PRN Brysan Mcevoy, [name]       PACLitaxel (TAXOL) 102 mg in sodium chloride 0.9 %  250 mL chemo infusion (</= 70m/m2)  65 mg/m2 (Order-Specific) Intravenous Once YEarlie Server MD         PHYSICAL EXAMINATION: ECOG PERFORMANCE STATUS: 2 - Symptomatic, <50% confined to bed Vitals:   01/14/22 0841  BP: 126/60  Pulse: 75  Resp: 18  Temp: (!) 97.4 F (36.3 C)   Filed Weights   01/14/22 0841  Weight: 133 lb (60.3 kg)     Physical Exam Constitutional:      General: She is not in acute distress. HENT:     Head: Normocephalic and atraumatic.  Eyes:     General: No scleral icterus. Cardiovascular:     Rate and Rhythm: Normal rate and regular rhythm.     Heart sounds: Normal heart sounds.  Pulmonary:     Effort: Pulmonary effort is normal. No respiratory distress.     Breath sounds: No wheezing.  Abdominal:     General: Bowel sounds are normal. There is no distension.     Palpations: Abdomen is soft.  Musculoskeletal:        General: No deformity. Normal range of motion.     Cervical back: Normal range of motion and neck supple.  Skin:    General: Skin is warm and dry.     Findings: No erythema or rash.  Neurological:     Mental Status: She is alert and oriented to person, place, and time. Mental status is at baseline.     Comments: Chronic left upper and lower extremity weakness.  Psychiatric:        Mood and Affect: Mood normal.  Left breast mass palpable, similar size.    LABORATORY DATA:  I have reviewed the data as listed Lab Results  Component Value Date   WBC 4.6 01/14/2022   HGB 11.6 (L) 01/14/2022   HCT 36.3 01/14/2022   MCV 93.6 01/14/2022   PLT 210 01/14/2022   Recent Labs    12/20/21 0820 01/03/22 0758 01/14/22 0759  NA 139 135 137  K 3.9 3.7 4.0  CL 109 107 110  CO2 21* 21* 20*  GLUCOSE 156*  166* 166*  BUN 14 11 13   CREATININE 0.72 0.71 0.81  CALCIUM 9.3 9.2 9.6  GFRNONAA >60 >60 >60  PROT 6.5 6.5 6.5  ALBUMIN 3.7 3.6 3.9  AST 20 19 17   ALT 28 19 17   ALKPHOS 41 45 39  BILITOT 0.5 0.2* 0.5    Iron/TIBC/Ferritin/ %Sat    Component Value Date/Time   IRON 113 12/08/2019 1015   TIBC 317 12/08/2019 1015   FERRITIN 67 12/08/2019 1015   IRONPCTSAT 36 12/08/2019 1015       RADIOGRAPHIC STUDIES: I have personally reviewed the radiological images as listed and agreed with the findings in the report. VAS [place]  Result Date: 12/18/2021 Carotid Arterial Duplex Study Patient Name:  AVEAR STATON Date of Exam:   12/12/2021 Medical Rec #: [id number]       Accession #:    [id number] of Birth: 3May 18, 1952       Patient Gender: F Patient Age:   89years Exam Location:  [facility] Procedure:      VAS [place] Referring Phys: JLeotis Pain--------------------------------------------------------------------------------  Indications:       Carotid artery disease and Right stent. Comparison Study:  10/2020 Performing Technologist: TConcha NorwayRVT  Examination Guidelines: A complete evaluation includes B-mode imaging, spectral Doppler, color Doppler, and power Doppler as needed of all accessible portions  of each vessel. Bilateral testing is considered an integral part of a complete examination. Limited examinations for reoccurring indications may be performed as noted.  Right Carotid Findings: +----------+--------+--------+--------+------------------+--------+             PSV cm/s EDV cm/s Stenosis Plaque Description Comments  +----------+--------+--------+--------+------------------+--------+  CCA Prox   62       14                                             +----------+--------+--------+--------+------------------+--------+  CCA Mid    75       16                                             +----------+--------+--------+--------+------------------+--------+  CCA Distal 74        20                                   stent     +----------+--------+--------+--------+------------------+--------+  ICA Prox   70       17       1-39%                       stent     +----------+--------+--------+--------+------------------+--------+  ICA Mid    80       19                                             +----------+--------+--------+--------+------------------+--------+  ICA Distal 84       22                                             +----------+--------+--------+--------+------------------+--------+  ECA        74       16                                             +----------+--------+--------+--------+------------------+--------+ +----------+--------+-------+----------------+-------------------+             PSV cm/s EDV cms Describe         Arm Pressure (mmHG)  +----------+--------+-------+----------------+-------------------+  Subclavian 104              Multiphasic, WNL                      +----------+--------+-------+----------------+-------------------+ +---------+--------+--+--------+--+---------+  Vertebral PSV cm/s 94 EDV cm/s 24 Antegrade  +---------+--------+--+--------+--+---------+     Left Carotid Findings: +----------+--------+--------+--------+------------------+--------+             PSV cm/s EDV cm/s Stenosis Plaque Description Comments  +----------+--------+--------+--------+------------------+--------+  CCA Prox   75       13                                             +----------+--------+--------+--------+------------------+--------+  CCA Mid    87       16                                             +----------+--------+--------+--------+------------------+--------+  CCA Distal 79       16                                             +----------+--------+--------+--------+------------------+--------+  ICA Prox   66       25                                             +----------+--------+--------+--------+------------------+--------+  ICA Mid    65       26                                              +----------+--------+--------+--------+------------------+--------+  ICA Distal 79       24                                             +----------+--------+--------+--------+------------------+--------+  ECA        139      17                                             +----------+--------+--------+--------+------------------+--------+ +----------+--------+--------+----------------+-------------------+             PSV cm/s EDV cm/s Describe         Arm Pressure (mmHG)  +----------+--------+--------+----------------+-------------------+  Subclavian 134               Multiphasic, WNL                      +----------+--------+--------+----------------+-------------------+ +---------+--------+--+--------+--+---------+  Vertebral PSV cm/s 38 EDV cm/s 10 Antegrade  +---------+--------+--+--------+--+---------+      Summary: Right Carotid: Velocities in the right ICA are consistent with a 1-39% stenosis.                Non-hemodynamically significant plaque <50% noted in the CCA.                Patent ICA stent. Left Carotid: Velocities in the left ICA are consistent with a 1-39% stenosis.               Non-hemodynamically significant plaque <50% noted in the CCA. Mild               calcified plaque throughout. Vertebrals:  Bilateral vertebral arteries demonstrate antegrade flow. Subclavians: Normal flow hemodynamics were seen in bilateral subclavian              arteries. *See table(s) above for measurements and observations.  Electronically signed by Leotis Pain MD on 12/18/2021 at 10:49:39 AM.    Final  ASSESSMENT & PLAN:  1. Encounter for antineoplastic chemotherapy   2. Antineoplastic chemotherapy induced anemia   3. Malignant neoplasm of overlapping sites of left breast in female, estrogen receptor positive ([place])   4. Hypomagnesemia   5. Weight loss   6. Hypokalemia    Cancer Staging  Invasive carcinoma of breast ([place]) Staging form: Breast, AJCC 8th Edition - Clinical  stage from 05/16/2021: Stage IIB (cT3, cN0, cM0, G3, ER+, PR+, HER2-) - Signed by [name], MD on 06/03/2021  #Left breast invasive carcinoma, multiple sites.  ER 90%, PR 11-50% positive, HER2 negative by IHC cT3 N0 On neoadjuvant chemotherapy, finished 4 cycles of ddAC Interval MRI showed stable size. On weekly Taxol.  Labs are reviewed and discussed with patient. Proceed with cycle 11 Taxol 65 mg/m2  #Possible chemotherapy-induced diarrhea.  Resolved.  Continue Imodium as needed.  Encourage oral hydration.  IV normal saline 500 cc over an hour for hydration today. #Weight loss, Continue nutrition supplements.., follow up with dietitian.  Advised patient to continue take dexamethasone daily for up to 3 days days after each chemotherapy treatments.  #Hypomagnesia, improved.  Magnesium 1.8 Continue Slow-Mag to 1 tablet twice daily.  #Chemotherapy-induced anemia,  Close monitor weekly. #Hypokalemia, potassium is stable 4,.  Continue potassium chloride to 20-meq twice daily.  Follow up follow-up in 1 week for last cycle of Taxol treatment. All questions were answered. The patient knows to call the clinic with any problems questions or concerns.  cc McLean-Scocuzza, Olam Idler, MD, PhD 01/14/2022

## 2022-01-14 NOTE — Progress Notes (Signed)
All labs and vitals reviewed by MD. Madaline Brilliant to proceed with treatment today per Dr. Tasia Catchings.

## 2022-01-14 NOTE — Progress Notes (Signed)
Patient here for follow up. Reports that she had diarrhea last week that's why she did not come to treatment.

## 2022-01-17 ENCOUNTER — Telehealth: Payer: Self-pay

## 2022-01-17 NOTE — Chronic Care Management (AMB) (Signed)
°  Care Management   Note  01/17/2022 Name: Tammy Nunez MRN: [id number] DOB: 1951/11/05  Tammy Nunez is a 71 y.o. year old female who is a primary care patient of McLean-Scocuzza, Nino Glow, MD and is actively engaged with the care management team. I reached out to [facility] by phone today to assist with re-scheduling a follow up visit with the Pharmacist  Follow up plan: Unsuccessful telephone outreach attempt made. A HIPAA compliant phone message was left for the patient providing contact information and requesting a return call.  The care management team will reach out to the patient again over the next 5 days.  If patient returns call to provider office, please advise to call [facility]  at [place], [place], [place], [place] [address] Direct Dial: [phone] [email or web address]: [email or web address]

## 2022-01-21 ENCOUNTER — Inpatient Hospital Stay: Payer: PPO

## 2022-01-21 ENCOUNTER — Inpatient Hospital Stay (HOSPITAL_BASED_OUTPATIENT_CLINIC_OR_DEPARTMENT_OTHER): Payer: PPO | Admitting: Oncology

## 2022-01-21 ENCOUNTER — Encounter: Payer: Self-pay | Admitting: Oncology

## 2022-01-21 ENCOUNTER — Other Ambulatory Visit: Payer: Self-pay

## 2022-01-21 VITALS — BP 118/74 | HR 74 | Temp 97.2°F | Wt 134.5 lb

## 2022-01-21 DIAGNOSIS — Z5111 Encounter for antineoplastic chemotherapy: Secondary | ICD-10-CM

## 2022-01-21 DIAGNOSIS — T451X5A Adverse effect of antineoplastic and immunosuppressive drugs, initial encounter: Secondary | ICD-10-CM

## 2022-01-21 DIAGNOSIS — D6481 Anemia due to antineoplastic chemotherapy: Secondary | ICD-10-CM

## 2022-01-21 DIAGNOSIS — C50912 Malignant neoplasm of unspecified site of left female breast: Secondary | ICD-10-CM

## 2022-01-21 DIAGNOSIS — E876 Hypokalemia: Secondary | ICD-10-CM

## 2022-01-21 LAB — MAGNESIUM: Magnesium: 1.6 mg/dL — ABNORMAL LOW (ref 1.7–2.4)

## 2022-01-21 LAB — CBC WITH DIFFERENTIAL/PLATELET
Abs Immature Granulocytes: 0.02 10*3/uL (ref 0.00–0.07)
Basophils Absolute: 0.1 10*3/uL (ref 0.0–0.1)
Basophils Relative: 2 %
Eosinophils Absolute: 0.2 10*3/uL (ref 0.0–0.5)
Eosinophils Relative: 7 %
HCT: 34.1 % — ABNORMAL LOW (ref 36.0–46.0)
Hemoglobin: 11.1 g/dL — ABNORMAL LOW (ref 12.0–15.0)
Immature Granulocytes: 1 %
Lymphocytes Relative: 37 %
Lymphs Abs: 1.2 10*3/uL (ref 0.7–4.0)
MCH: 30.4 pg (ref 26.0–34.0)
MCHC: 32.6 g/dL (ref 30.0–36.0)
MCV: 93.4 fL (ref 80.0–100.0)
Monocytes Absolute: 0.2 10*3/uL (ref 0.1–1.0)
Monocytes Relative: 6 %
Neutro Abs: 1.6 10*3/uL — ABNORMAL LOW (ref 1.7–7.7)
Neutrophils Relative %: 47 %
Platelets: 173 10*3/uL (ref 150–400)
RBC: 3.65 MIL/uL — ABNORMAL LOW (ref 3.87–5.11)
RDW: 17.2 % — ABNORMAL HIGH (ref 11.5–15.5)
WBC: 3.3 10*3/uL — ABNORMAL LOW (ref 4.0–10.5)
nRBC: 0.6 % — ABNORMAL HIGH (ref 0.0–0.2)

## 2022-01-21 LAB — COMPREHENSIVE METABOLIC PANEL
ALT: 30 U/L (ref 0–44)
AST: 23 U/L (ref 15–41)
Albumin: 3.9 g/dL (ref 3.5–5.0)
Alkaline Phosphatase: 37 U/L — ABNORMAL LOW (ref 38–126)
Anion gap: 7 (ref 5–15)
BUN: 14 mg/dL (ref 8–23)
CO2: 24 mmol/L (ref 22–32)
Calcium: 9.3 mg/dL (ref 8.9–10.3)
Chloride: 105 mmol/L (ref 98–111)
Creatinine, Ser: 0.72 mg/dL (ref 0.44–1.00)
GFR, Estimated: >60 mL/min (ref >60)
Glucose, Bld: 108 mg/dL — ABNORMAL HIGH (ref 70–99)
Potassium: 3.8 mmol/L (ref 3.5–5.1)
Sodium: 136 mmol/L (ref 135–145)
Total Bilirubin: 0.2 mg/dL — ABNORMAL LOW (ref 0.3–1.2)
Total Protein: 6.3 g/dL — ABNORMAL LOW (ref 6.5–8.1)

## 2022-01-21 MED ORDER — SODIUM CHLORIDE 0.9 % IV SOLN
10.0000 mg | Freq: Once | INTRAVENOUS | Status: AC
  Administered 2022-01-21: 10 mg via INTRAVENOUS
  Filled 2022-01-21: qty 10

## 2022-01-21 MED ORDER — SODIUM CHLORIDE 0.9 % IV SOLN
Freq: Once | INTRAVENOUS | Status: AC
  Filled 2022-01-21: qty 250

## 2022-01-21 MED ORDER — HEPARIN SOD (PORK) LOCK FLUSH 100 UNIT/ML IV SOLN
INTRAVENOUS | Status: AC
  Administered 2022-01-21: 500 [IU]
  Filled 2022-01-21: qty 5

## 2022-01-21 MED ORDER — SODIUM CHLORIDE 0.9 % IV SOLN
65.0000 mg/m2 | Freq: Once | INTRAVENOUS | Status: AC
  Administered 2022-01-21: 102 mg via INTRAVENOUS
  Filled 2022-01-21: qty 17

## 2022-01-21 MED ORDER — MAGNESIUM SULFATE 2 GM/50ML IV SOLN
2.0000 g | Freq: Once | INTRAVENOUS | Status: AC
  Administered 2022-01-21: 2 g via INTRAVENOUS
  Filled 2022-01-21: qty 50

## 2022-01-21 MED ORDER — FAMOTIDINE IN NACL 20-0.9 MG/50ML-% IV SOLN
20.0000 mg | Freq: Once | INTRAVENOUS | Status: AC
  Administered 2022-01-21: 20 mg via INTRAVENOUS
  Filled 2022-01-21: qty 50

## 2022-01-21 MED ORDER — SODIUM CHLORIDE 0.9% FLUSH
10.0000 mL | INTRAVENOUS | Status: DC | PRN
  Filled 2022-01-21: qty 10

## 2022-01-21 MED ORDER — HEPARIN SOD (PORK) LOCK FLUSH 100 UNIT/ML IV SOLN
500.0000 [IU] | Freq: Once | INTRAVENOUS | Status: AC | PRN
  Filled 2022-01-21: qty 5

## 2022-01-21 MED ORDER — DIPHENHYDRAMINE HCL 50 MG/ML IJ SOLN
50.0000 mg | Freq: Once | INTRAMUSCULAR | Status: AC
  Administered 2022-01-21: 50 mg via INTRAVENOUS
  Filled 2022-01-21: qty 1

## 2022-01-21 MED ORDER — SODIUM CHLORIDE 0.9% FLUSH
10.0000 mL | Freq: Once | INTRAVENOUS | Status: AC
  Administered 2022-01-21: 10 mL via INTRAVENOUS
  Filled 2022-01-21: qty 10

## 2022-01-21 NOTE — Patient Instructions (Signed)
[facility] CTR AT [place]  Discharge Instructions: Thank you for choosing [facility] to provide your oncology and hematology care.  If you have a lab appointment with the [facility], please go directly to the [facility] and check in at the registration area.  Wear comfortable clothing and clothing appropriate for easy access to any Portacath or PICC line.   We strive to give you quality time with your provider. You may need to reschedule your appointment if you arrive late (15 or more minutes).  Arriving late affects you and other patients whose appointments are after yours.  Also, if you miss three or more appointments without notifying the office, you may be dismissed from the clinic at the providers discretion.      For prescription refill requests, have your pharmacy contact our office and allow 72 hours for refills to be completed.    Today you received the following chemotherapy and/or immunotherapy agents - paclitaxel      To help prevent nausea and vomiting after your treatment, we encourage you to take your nausea medication as directed.  BELOW ARE SYMPTOMS THAT SHOULD BE REPORTED IMMEDIATELY: *FEVER GREATER THAN 100.4 F (38 C) OR HIGHER *CHILLS OR SWEATING *NAUSEA AND VOMITING THAT IS NOT CONTROLLED WITH YOUR NAUSEA MEDICATION *UNUSUAL SHORTNESS OF BREATH *UNUSUAL BRUISING OR BLEEDING *URINARY PROBLEMS (pain or burning when urinating, or frequent urination) *BOWEL PROBLEMS (unusual diarrhea, constipation, pain near the anus) TENDERNESS IN MOUTH AND THROAT WITH OR WITHOUT PRESENCE OF ULCERS (sore throat, sores in mouth, or a toothache) UNUSUAL RASH, SWELLING OR PAIN  UNUSUAL VAGINAL DISCHARGE OR ITCHING   Items with * indicate a potential emergency and should be followed up as soon as possible or go to the Emergency Department if any problems should occur.  Please show the CHEMOTHERAPY ALERT CARD or IMMUNOTHERAPY ALERT CARD at check-in  to the Emergency Department and triage nurse.  Should you have questions after your visit or need to cancel or reschedule your appointment, please contact [facility] [place] AT [place]  [phone] and follow the prompts.  Office hours are 8:00 a.m. to 4:30 p.m. Monday - Friday. Please note that voicemails left after 4:00 p.m. may not be returned until the following business day.  We are closed weekends and major holidays. You have access to a nurse at all times for urgent questions. Please call the main number to the clinic [phone] and follow the prompts.  For any non-urgent questions, you may also contact your provider using MyChart. We now offer e-Visits for anyone 51 and older to request care online for non-urgent symptoms. For details visit [email or web address].   Also download the MyChart app! Go to the app store, search "MyChart", open the app, select [facility], and log in with your MyChart username and password.  Due to Covid, a mask is required upon entering the hospital/clinic. If you do not have a mask, one will be given to you upon arrival. For doctor visits, patients may have 1 support person aged 97 or older with them. For treatment visits, patients cannot have anyone with them due to current Covid guidelines and our immunocompromised population.   Paclitaxel injection What is this medication? PACLITAXEL (PAK li TAX el) is a chemotherapy drug. It targets fast dividing cells, like cancer cells, and causes these cells to die. This medicine is used to treat ovarian cancer, breast cancer, lung cancer, Kaposi's sarcoma, and other cancers. This medicine may be used for other purposes;  ask your health care provider or pharmacist if you have questions. COMMON BRAND NAME(S): Onxol, Taxol What should I tell my care team before I take this medication? They need to know if you have any of these conditions: history of irregular heartbeat liver disease low blood counts,  like low white cell, platelet, or red cell counts lung or breathing disease, like asthma tingling of the fingers or toes, or other nerve disorder an unusual or allergic reaction to paclitaxel, alcohol, polyoxyethylated castor oil, other chemotherapy, other medicines, foods, dyes, or preservatives pregnant or trying to get pregnant breast-feeding How should I use this medication? This drug is given as an infusion into a vein. It is administered in a hospital or clinic by a specially trained health care professional. Talk to your pediatrician regarding the use of this medicine in children. Special care may be needed. Overdosage: If you think you have taken too much of this medicine contact a poison control center or emergency room at once. NOTE: This medicine is only for you. Do not share this medicine with others. What if I miss a dose? It is important not to miss your dose. Call your doctor or health care professional if you are unable to keep an appointment. What may interact with this medication? Do not take this medicine with any of the following medications: live virus vaccines This medicine may also interact with the following medications: antiviral medicines for hepatitis, HIV or AIDS certain antibiotics like erythromycin and clarithromycin certain medicines for fungal infections like ketoconazole and itraconazole certain medicines for seizures like carbamazepine, phenobarbital, phenytoin gemfibrozil nefazodone rifampin St. John's wort This list may not describe all possible interactions. Give your health care provider a list of all the medicines, herbs, non-prescription drugs, or dietary supplements you use. Also tell them if you smoke, drink alcohol, or use illegal drugs. Some items may interact with your medicine. What should I watch for while using this medication? Your condition will be monitored carefully while you are receiving this medicine. You will need important blood work  done while you are taking this medicine. This medicine can cause serious allergic reactions. To reduce your risk you will need to take other medicine(s) before treatment with this medicine. If you experience allergic reactions like skin rash, itching or hives, swelling of the face, lips, or tongue, tell your doctor or health care professional right away. In some cases, you may be given additional medicines to help with side effects. Follow all directions for their use. This drug may make you feel generally unwell. This is not uncommon, as chemotherapy can affect healthy cells as well as cancer cells. Report any side effects. Continue your course of treatment even though you feel ill unless your doctor tells you to stop. Call your doctor or health care professional for advice if you get a fever, chills or sore throat, or other symptoms of a cold or flu. Do not treat yourself. This drug decreases your body's ability to fight infections. Try to avoid being around people who are sick. This medicine may increase your risk to bruise or bleed. Call your doctor or health care professional if you notice any unusual bleeding. Be careful brushing and flossing your teeth or using a toothpick because you may get an infection or bleed more easily. If you have any dental work done, tell your dentist you are receiving this medicine. Avoid taking products that contain aspirin, acetaminophen, ibuprofen, naproxen, or ketoprofen unless instructed by your doctor. These medicines may hide  a fever. Do not become pregnant while taking this medicine. Women should inform their doctor if they wish to become pregnant or think they might be pregnant. There is a potential for serious side effects to an unborn child. Talk to your health care professional or pharmacist for more information. Do not breast-feed an infant while taking this medicine. Men are advised not to father a child while receiving this medicine. This product may  contain alcohol. Ask your pharmacist or healthcare provider if this medicine contains alcohol. Be sure to tell all healthcare providers you are taking this medicine. Certain medicines, like metronidazole and disulfiram, can cause an unpleasant reaction when taken with alcohol. The reaction includes flushing, headache, nausea, vomiting, sweating, and increased thirst. The reaction can last from 30 minutes to several hours. What side effects may I notice from receiving this medication? Side effects that you should report to your doctor or health care professional as soon as possible: allergic reactions like skin rash, itching or hives, swelling of the face, lips, or tongue breathing problems changes in vision fast, irregular heartbeat high or low blood pressure mouth sores pain, tingling, numbness in the hands or feet signs of decreased platelets or bleeding - bruising, pinpoint red spots on the skin, black, tarry stools, blood in the urine signs of decreased red blood cells - unusually weak or tired, feeling faint or lightheaded, falls signs of infection - fever or chills, cough, sore throat, pain or difficulty passing urine signs and symptoms of liver injury like dark yellow or brown urine; general ill feeling or flu-like symptoms; light-colored stools; loss of appetite; nausea; right upper belly pain; unusually weak or tired; yellowing of the eyes or skin swelling of the ankles, feet, hands unusually slow heartbeat Side effects that usually do not require medical attention (report to your doctor or health care professional if they continue or are bothersome): diarrhea hair loss loss of appetite muscle or joint pain nausea, vomiting pain, redness, or irritation at site where injected tiredness This list may not describe all possible side effects. Call your doctor for medical advice about side effects. You may report side effects to FDA at 1-800-FDA-1088. Where should I keep my  medication? This drug is given in a hospital or clinic and will not be stored at home. NOTE: This sheet is a summary. It may not cover all possible information. If you have questions about this medicine, talk to your doctor, pharmacist, or health care provider.  2022 [employer] (2021-07-30 00:00:00)

## 2022-01-21 NOTE — Progress Notes (Signed)
Hematology/Oncology Progress note [phone]    Patient Care Team: McLean-Scocuzza, Nino Glow, MD as PCP - General (Internal Medicine) De Hollingshead, [place] as Pharmacist (Pharmacist) Theodore Demark, RN as Oncology Nurse Navigator  REFERRING PROVIDER: McLean-Scocuzza, Olivia Mackie *  CHIEF COMPLAINTS/REASON FOR VISIT:  multifocal left breast cancer  HISTORY OF PRESENTING ILLNESS:   Tammy Nunez is a  71 y.o.  female with PMH listed below was seen in consultation at the request of  McLean-Scocuzza, Olivia Mackie *  for evaluation of multifocal left breast cancer.   04/24/2021, screening mammogram showed large asymmetry in the superior aspect of the breast with associated calcifications and possible masses and a subtle distortions.  No suspicious findings for malignancy right breast.  05/02/2021 left breast diagnostic mammogram and ultrasound showed multiple irregular mass in the left breast. Mammogram mass 1 irregular mass associated with distortion in the upper breast at middle depth, multiple adjacent and possible continuous irregular masses, conglomeration of masses measures at least 5.6 cm.  Associated pleomorphic calcifications extending at least 5 x 3.9 x 3.7 cm Mass 2, 1 cm mass in the upper breast at the middle to posterior depth. Mass 3, 5 mm irregular mass in the upper breast at posterior depth. In total, mass 1-mass 3 span at least 8.2 cm in anterior-posterior extent.  By ultrasound  mass 1 12:00 5 cm from nipple, 2.7 x 1.5 x 4.3 cm Mass 2, 12:00 12 cm from nipple, 0.9 x 0.9 x 0.5 cm Mass 3   12:00 14 cm from nipple, 0.4 x 0.4 x 0.4 cm-uses 2.4 cm superior to mass to Mass 4   11:00  No suspicious left axillary adenopathy.   05/07/2021 -Ultrasound-guided breast biopsy Breast left 12:00 mass 5 cm from nipple biopsy showed invasive mammary carcinoma, no special type, grade 3, high-grade DCIS present, no LVI, ER 90%, PR 11-50%, HER2 negative by IHC Breast left 11:00  mass 10 cm from nipple biopsy showed invasive mammary carcinoma, no special type, grade 2, no DCIS/LVI identified.ER 90%, PR 11-50%, HER2 negative by IHC  Patient is married has no children.  Denies any hormone replacement, oral contraceptive use, family history of breast cancer.  Denies any chest radiation. Patient has a history of stroke with chronic residual weakness of the left side.  She walks with a cane.  05/22/2021 MRI breast showed that nonmass like malignancy spanning over 8 cm, abuting anterior aspect of pectoralis muscle. Discussed with Dr.Cintron and we agree that she will need mastectomy. There is concern of possible positive margin. I recommend neoadjuvant chemotherapy  # medi port placed on 06/06/2021. 06/13/2021 pre chemotherapy Echo. LVEF 60-65%.  Normal global longitudinal strain. Grade 1 diastolic dysfunction.   # 05/22/2021 MRI breast showed  Biopsy-proven malignancy within the UPPER LEFT breast spanning a distance of 8.3 x 4.2 x 4 cm nonmass like enhancement abuts the anterior aspect of the LEFT pectoralis muscle without gross invasion. Oncotype Dx 27. Discussed with Dr.Cintron, there is concern of positive margin.   #Chemotherapy 06/21/2021 - 08/16/2021 ddAC x4 with G-CSF support. 08/30/2021 - 09/06/2021, weekly Taxol. Chemotherapy break due to side effects, UTI and the patient's preference. 10/25/2021  INTERVAL HISTORY Tammy Nunez is a 71 y.o. female who has above history reviewed by me today presents for follow up visit for chemotherapy of breast cancer Patient was accompanied by her husband. Overall she feels well.  No nausea vomiting diarrhea.  Denies any neuropathy symptoms.   Review of Systems  Constitutional:  Positive for fatigue.  Negative for appetite change, chills and fever.  HENT:   Negative for hearing loss and voice change.   Eyes:  Negative for eye problems.  Respiratory:  Negative for chest tightness and cough.   Cardiovascular:  Negative for chest  pain.  Gastrointestinal:  Negative for abdominal distention, abdominal pain, blood in stool, diarrhea and nausea.  Endocrine: Negative for hot flashes.  Genitourinary:  Negative for difficulty urinating, dysuria and frequency.   Musculoskeletal:  Negative for arthralgias.  Skin:  Negative for itching and rash.  Neurological:  Negative for extremity weakness.  Hematological:  Negative for adenopathy. Does not bruise/bleed easily.  Psychiatric/Behavioral:  Negative for confusion.    MEDICAL HISTORY:  Past Medical History:  Diagnosis Date   Breast cancer in female [facility]) 05/08/2021   Chronic left shoulder pain    Coronary artery disease    Diabetes mellitus without complication ([place])    Dysrhythmia    Hypertension    Hypomagnesemia 09/13/2021   Paralysis ([place])    weakness left upper amd lower extremity   PONV (postoperative nausea and vomiting)    Stroke [facility])     SURGICAL HISTORY: Past Surgical History:  Procedure Laterality Date   BREAST BIOPSY Left 05/07/2021   12:00 5cmfn vision clip path pending   BREAST BIOPSY Left 05/07/2021   11:00 10 cmfn mini cork clip path pending   CAROTID PTA/STENT INTERVENTION Left 07/20/2019   Procedure: CAROTID PTA/STENT INTERVENTION;  Surgeon: Katha Cabal, MD;  Location: [facility];  Service: Cardiovascular;  Laterality: Left;   ECTOPIC PREGNANCY SURGERY     PORTACATH PLACEMENT N/A 06/06/2021   Procedure: INSERTION PORT-A-CATH;  Surgeon: Herbert Pun, MD;  Location: [facility];  Service: General;  Laterality: N/A;    SOCIAL HISTORY: Social History   Socioeconomic History   Marital status: Married    Spouse name: Not on file   Number of children: Not on file   Years of education: Not on file   Highest education level: Not on file  Occupational History   Not on file  Tobacco Use   Smoking status: Former    Packs/day: 0.50    Years: 15.00    Pack years: 7.50    Types: Cigarettes    Quit date: 04/11/2019     Years since quitting: 2.7   Smokeless tobacco: Never  Vaping Use   Vaping Use: Never used  Substance and Sexual Activity   Alcohol use: Never   Drug use: Never   Sexual activity: Not Currently  Other Topics Concern   Not on file  Social History Narrative   No kids    Married with husband    Used to work [employer] broadcast assistant    Social Determinants of Radio broadcast assistant Strain: Low Risk    Difficulty of Paying Living Expenses: Not hard at all  Food Insecurity: Not on file  Transportation Needs: Not on file  Physical Activity: Not on file  Stress: Not on file  Social Connections: Not on file  Intimate Partner Violence: Not on file    FAMILY HISTORY: Family History  Problem Relation Age of Onset   Diabetes type II Mother    Hypertension Father    Heart attack Father    Diabetes type II Brother    Breast cancer Neg Hx     ALLERGIES:  has No Known Allergies.  MEDICATIONS:  Current Outpatient Medications  Medication Sig Dispense Refill   amLODipine (NORVASC) 10 MG tablet Take  1 tablet (10 mg total) by mouth daily. 90 tablet 3   aspirin 81 MG EC tablet Take 1 tablet (81 mg total) by mouth daily. 90 tablet 3   atorvastatin (LIPITOR) 80 MG tablet Take 1 tablet (80 mg total) by mouth daily. 90 tablet 3   blood glucose meter kit and supplies 1 each by Other route 4 (four) times daily. Dispense based on patient and insurance preference. Four times daily as directed. (FOR ICD-10 E10.9, E11.9). 1 each 0   carvedilol (COREG) 25 MG tablet Take 1 tablet (25 mg total) by mouth 2 (two) times daily with a meal. 180 tablet 3   clopidogrel (PLAVIX) 75 MG tablet Take 1 tablet (75 mg total) by mouth daily. Further refills [facility] 90 tablet 3   Continuous Blood Gluc Sensor (FREESTYLE LIBRE 2 SENSOR) MISC USE TO CHECK BLOOD SUGAR AT LEAST EVERY 8 HOURS 6 each 0   dexamethasone (DECADRON) 2 MG tablet Take 1 tablet (2 mg total) by mouth See admin instructions. Take 68m  daily for 2 days after each chemotherapy. 10 tablet 0   empagliflozin (JARDIANCE) 25 MG TABS tablet Take 1 tablet (25 mg total) by mouth daily. 90 tablet 3   lidocaine-prilocaine (EMLA) cream Apply to affected area once 30 g 3   lisinopril (ZESTRIL) 40 MG tablet Take 1 tablet (40 mg total) by mouth daily. 90 tablet 3   loperamide (IMODIUM) 2 MG capsule Take 1 capsule (2 mg total) by mouth See admin instructions. Take 443mat the onset of diarrhea, and then 24m51mfter each loose bowel movement, maximum 2m25mr 24 hours. 30 capsule 1   loratadine (CLARITIN) 10 MG tablet Take 1 tablet (10 mg total) by mouth daily. 90 tablet 3   magnesium chloride (SLOW-MAG) 64 MG TBEC SR tablet Take 1 tablet (64 mg total) by mouth daily. 30 tablet 1   Multiple Vitamin (MULTIVITAMIN) tablet Take 1 tablet by mouth daily.     ondansetron (ZOFRAN) 8 MG tablet Take 1 tablet (8 mg total) by mouth 2 (two) times daily as needed. Start on the third day after chemotherapy. 30 tablet 1   potassium chloride SA (KLOR-CON M20) 20 MEQ tablet Take 1 tablet (20 mEq total) by mouth daily. 90 tablet 3   prochlorperazine (COMPAZINE) 10 MG tablet Take 1 tablet (10 mg total) by mouth every 6 (six) hours as needed (Nausea or vomiting). 30 tablet 1   vitamin B-12 (CYANOCOBALAMIN) 1000 MCG tablet Take 1 tablet (1,000 mcg total) by mouth daily. 30 tablet 1   Vitamin D, Cholecalciferol, 50 MCG (2000 UT) CAPS Take 2,000 Units by mouth.     No current facility-administered medications for this visit.   Facility-Administered Medications Ordered in Other Visits  Medication Dose Route Frequency Provider Last Rate Last Admin   heparin lock flush 100 UNIT/ML injection              PHYSICAL EXAMINATION: ECOG PERFORMANCE STATUS: 2 - Symptomatic, <50% confined to bed There were no vitals filed for this visit.  There were no vitals filed for this visit.    Physical Exam Constitutional:      General: She is not in acute distress. HENT:      Head: Normocephalic and atraumatic.  Eyes:     General: No scleral icterus. Cardiovascular:     Rate and Rhythm: Normal rate and regular rhythm.     Heart sounds: Normal heart sounds.  Pulmonary:     Effort: Pulmonary effort is  normal. No respiratory distress.     Breath sounds: No wheezing.  Abdominal:     General: Bowel sounds are normal. There is no distension.     Palpations: Abdomen is soft.  Musculoskeletal:        General: No deformity. Normal range of motion.     Cervical back: Normal range of motion and neck supple.  Skin:    General: Skin is warm and dry.     Findings: No erythema or rash.  Neurological:     Mental Status: She is alert and oriented to person, place, and time. Mental status is at baseline.     Comments: Chronic left upper and lower extremity weakness.  Psychiatric:        Mood and Affect: Mood normal.  Left breast mass palpable, similar size.    LABORATORY DATA:  I have reviewed the data as listed Lab Results  Component Value Date   WBC 3.3 (L) 01/21/2022   HGB 11.1 (L) 01/21/2022   HCT 34.1 (L) 01/21/2022   MCV 93.4 01/21/2022   PLT 173 01/21/2022   Recent Labs    12/20/21 0820 01/03/22 0758 01/14/22 0759  NA 139 135 137  K 3.9 3.7 4.0  CL 109 107 110  CO2 21* 21* 20*  GLUCOSE 156* 166* 166*  BUN 14 11 13   CREATININE 0.72 0.71 0.81  CALCIUM 9.3 9.2 9.6  GFRNONAA >60 >60 >60  PROT 6.5 6.5 6.5  ALBUMIN 3.7 3.6 3.9  AST 20 19 17   ALT 28 19 17   ALKPHOS 41 45 39  BILITOT 0.5 0.2* 0.5    Iron/TIBC/Ferritin/ %Sat    Component Value Date/Time   IRON 113 12/08/2019 1015   TIBC 317 12/08/2019 1015   FERRITIN 67 12/08/2019 1015   IRONPCTSAT 36 12/08/2019 1015       RADIOGRAPHIC STUDIES: I have personally reviewed the radiological images as listed and agreed with the findings in the report. VAS US CAROTID  Result Date: 12/18/2021 Carotid Arterial Duplex Study Patient Name:  Tammy Nunez  Date of Exam:   12/12/2021 Medical Rec  #: [id number]        Accession #:    [id number] Date of Birth: 06/03/1951        Patient Gender: F Patient Age:   33 years Exam Location:  [facility] Procedure:      VAS US CAROTID Referring Phys: Leotis Pain --------------------------------------------------------------------------------  Indications:       Carotid artery disease and Right stent. Comparison Study:  10/2020 Performing Technologist: Concha Norway RVT  Examination Guidelines: A complete evaluation includes B-mode imaging, spectral Doppler, color Doppler, and power Doppler as needed of all accessible portions of each vessel. Bilateral testing is considered an integral part of a complete examination. Limited examinations for reoccurring indications may be performed as noted.  Right Carotid Findings: +----------+--------+--------+--------+------------------+--------+             PSV cm/s EDV cm/s Stenosis Plaque Description Comments  +----------+--------+--------+--------+------------------+--------+  CCA Prox   62       14                                             +----------+--------+--------+--------+------------------+--------+  CCA Mid    75       16                                             +----------+--------+--------+--------+------------------+--------+  CCA Distal 74       20                                   stent     +----------+--------+--------+--------+------------------+--------+  ICA Prox   70       17       1-39%                       stent     +----------+--------+--------+--------+------------------+--------+  ICA Mid    80       19                                             +----------+--------+--------+--------+------------------+--------+  ICA Distal 84       22                                             +----------+--------+--------+--------+------------------+--------+  ECA        74       16                                             +----------+--------+--------+--------+------------------+--------+  +----------+--------+-------+----------------+-------------------+             PSV cm/s EDV cms Describe         Arm Pressure (mmHG)  +----------+--------+-------+----------------+-------------------+  Subclavian 104              Multiphasic, WNL                      +----------+--------+-------+----------------+-------------------+ +---------+--------+--+--------+--+---------+  Vertebral PSV cm/s 94 EDV cm/s 24 Antegrade  +---------+--------+--+--------+--+---------+     Left Carotid Findings: +----------+--------+--------+--------+------------------+--------+             PSV cm/s EDV cm/s Stenosis Plaque Description Comments  +----------+--------+--------+--------+------------------+--------+  CCA Prox   75       13                                             +----------+--------+--------+--------+------------------+--------+  CCA Mid    87       16                                             +----------+--------+--------+--------+------------------+--------+  CCA Distal 79       16                                             +----------+--------+--------+--------+------------------+--------+  ICA Prox   66       25                                             +----------+--------+--------+--------+------------------+--------+  ICA Mid    65       26                                             +----------+--------+--------+--------+------------------+--------+  ICA Distal 79       24                                             +----------+--------+--------+--------+------------------+--------+  ECA        139      17                                             +----------+--------+--------+--------+------------------+--------+ +----------+--------+--------+----------------+-------------------+             PSV cm/s EDV cm/s Describe         Arm Pressure (mmHG)  +----------+--------+--------+----------------+-------------------+  Subclavian 134               Multiphasic, WNL                       +----------+--------+--------+----------------+-------------------+ +---------+--------+--+--------+--+---------+  Vertebral PSV cm/s 38 EDV cm/s 10 Antegrade  +---------+--------+--+--------+--+---------+      Summary: Right Carotid: Velocities in the right ICA are consistent with a 1-39% stenosis.                Non-hemodynamically significant plaque <50% noted in the CCA.                Patent ICA stent. Left Carotid: Velocities in the left ICA are consistent with a 1-39% stenosis.               Non-hemodynamically significant plaque <50% noted in the CCA. Mild               calcified plaque throughout. Vertebrals:  Bilateral vertebral arteries demonstrate antegrade flow. Subclavians: Normal flow hemodynamics were seen in bilateral subclavian              arteries. *See table(s) above for measurements and observations.  Electronically signed by Leotis Pain MD on 12/18/2021 at 10:49:39 AM.    Final        ASSESSMENT & PLAN:  1. Malignant neoplasm of left female breast, unspecified estrogen receptor status, unspecified site of breast ([place])   2. Encounter for antineoplastic chemotherapy   3. Antineoplastic chemotherapy induced anemia   4. Hypokalemia   5. Hypomagnesemia    Cancer Staging  Invasive carcinoma of breast ([place]) Staging form: Breast, AJCC 8th Edition - Clinical stage from 05/16/2021: Stage IIB (cT3, cN0, cM0, G3, ER+, PR+, HER2-) - Signed by [name], MD on 06/03/2021  #Left breast invasive carcinoma, multiple sites.  ER 90%, PR 11-50% positive, HER2 negative by IHC cT3 N0 On neoadjuvant chemotherapy, finished 4 cycles of ddAC Interval MRI showed stable size. On weekly Taxol.  Labs are reviewed and discussed with patient Proceed with cycle 12 Taxol 65 mg/M2 Obtain MRI bilateral breast in 2 to 3 weeks. Communicated with Dr. Peyton Najjar who will see patient for evaluation of surgical    #Possible chemotherapy-induced diarrhea.  Resolved.  Continue Imodium as needed.  Encourage oral  hydration.  IV normal saline 500 cc over an hour for hydration today. #Weight loss, Continue nutrition supplements.., follow up with dietitian.  Advised patient to continue take dexamethasone daily for up to 3 days days after each chemotherapy treatments.  #Hypomagnesia, improved.  Magnesium 1.6, patient will get 2 g of magnesium sulfate IV x1 today. Continue Slow-Mag to 1 tablet twice daily.  #Chemotherapy-induced anemia,  Close monitor weekly. #Hypokalemia, potassium is stable.  Continue potassium chloride to 20-meq twice daily.  Follow up to be determined.  I plan to see her a few weeks after her surgery or earlier if needed.    All questions were answered. The patient knows to call the clinic with any problems questions or concerns.  cc McLean-Scocuzza, Olam Idler, MD, PhD 01/21/2022

## 2022-01-23 ENCOUNTER — Telehealth: Payer: PPO

## 2022-01-28 ENCOUNTER — Ambulatory Visit: Payer: Self-pay | Admitting: Pharmacist

## 2022-01-28 NOTE — Chronic Care Management (AMB) (Signed)
?  Chronic Care Management  ? ?Note ? ?01/28/2022 ?Name: Traniya Prichett MRN: [id number] DOB: 10-Dec-1950 ? ? ? ?Closing pharmacy CCM case at this time. Will collaborate with Care Guide to outreach to schedule follow up with RN CM. Patient has clinic contact information for future questions or concerns.  ? ?Catie [name], PharmD, [place], CPP ?Clinical Pharmacist ?Therapist, music at [employer] ?[phone] ? ?

## 2022-02-03 ENCOUNTER — Telehealth: Payer: Self-pay

## 2022-02-03 NOTE — Chronic Care Management (AMB) (Signed)
?  Chronic Care Management  ? ?Note ? ?02/03/2022 ?Name: [name] MRN: [id number] DOB: 07/11/1951 ? ?Tammy Nunez is a 71 y.o. year old female who is a primary care patient of McLean-Scocuzza, Nino Glow, MD. Tammy Nunez is currently enrolled in care management services. An additional referral for RN CM  was placed.  ? ?Follow up plan: ?Telephone appointment with care management team member scheduled for:02/05/2022 ? ?Noreene Larsson, RMA ?Care Guide, Embedded Care Coordination ?Why  Care Management  ?[place], [place] [address] ?Direct Dial: [phone] ?[email or web address] ?Website: [email or web address]  ? ?

## 2022-02-04 ENCOUNTER — Other Ambulatory Visit: Payer: Self-pay | Admitting: General Surgery

## 2022-02-04 ENCOUNTER — Telehealth: Payer: PPO

## 2022-02-04 DIAGNOSIS — C50812 Malignant neoplasm of overlapping sites of left female breast: Secondary | ICD-10-CM

## 2022-02-05 ENCOUNTER — Ambulatory Visit: Payer: Self-pay | Admitting: General Surgery

## 2022-02-05 ENCOUNTER — Ambulatory Visit (INDEPENDENT_AMBULATORY_CARE_PROVIDER_SITE_OTHER): Payer: PPO | Admitting: *Deleted

## 2022-02-05 DIAGNOSIS — I10 Essential (primary) hypertension: Secondary | ICD-10-CM

## 2022-02-05 DIAGNOSIS — E1165 Type 2 diabetes mellitus with hyperglycemia: Secondary | ICD-10-CM

## 2022-02-05 NOTE — H&P (View-Only) (Signed)
HISTORY OF PRESENT ILLNESS:  ?  ?Tammy Nunez is a 71 y.o.female patient who comes for surgical evaluation for left breast cancer. ? ?Patient with left breast cancer diagnosed on June 2022.  At that time he was found to be multicentric with multiple masses positive for invasive mammary carcinoma with different grades.  She was treated initially with neoadjuvant chemotherapy.  She finished her last chemotherapy 2 weeks ago.  She endorses that it was very difficult for her.  The patient completed 4 cycles of ddAC and 12 cycles of Taxol.  She had multiple intervention due to side effects. ? ?Today the patient endorses feeling well.  She denies any pain.  There is no pain radiation.  There is no alleviating or aggravating factors.  Patient denies any weakness.  Patient denies any chest pain.  Patient denies any shortness of breath. ? ?Patient had multiple interval MRIs of the breast.  Last MRI was done 08/26/2021.  This shows persistent multicentric left breast invasive mammary carcinoma.  There has been no significant change on the size of the masses.  I personally evaluated the images.  No new masses on either breast. ?  ?PAST MEDICAL HISTORY:  ?Past Medical History:  ?Diagnosis Date  ? Diabetes mellitus without complication (CMS-HCC)   ? History of stroke   ? Hyperlipidemia   ? Hypertension   ?  ?  ?  ?PAST SURGICAL HISTORY:   ?Past Surgical History:  ?Procedure Laterality Date  ? carotid stent placment    ? TRANSCATHETER PLACEMENT STENT OPEN COMMON CAROTID / INNOMINATE ARTERY    ?    ?   ?MEDICATIONS:  ?Outpatient Encounter Medications as of 02/04/2022  ?Medication Sig Dispense Refill  ? acetaminophen (TYLENOL) 325 MG tablet Take 650 mg by mouth every 4 (four) hours as needed for Pain    ? amLODIPine (NORVASC) 10 MG tablet Take 5 mg by mouth once daily    ? aspirin 81 MG EC tablet Take 81 mg by mouth once daily    ? atorvastatin (LIPITOR) 80 MG tablet Take 40 mg by mouth once daily    ? carvediloL (COREG) 25 MG tablet  Take 6.25 mg by mouth 2 (two) times daily with meals    ? cholecalciferol, vitamin D3, 100 mcg (4,000 unit) Tab Take by mouth    ? clopidogreL (PLAVIX) 75 mg tablet Take 1 tablet (75 mg total) by mouth once daily 90 tablet 3  ? cyanocobalamin (VITAMIN B12) 1000 MCG tablet Take 1,000 mcg by mouth once daily    ? empagliflozin (JARDIANCE) 25 mg tablet Take 1 tablet by mouth once daily    ? lisinopriL (ZESTRIL) 40 MG tablet Take 40 mg by mouth once daily    ? loratadine (CLARITIN) 10 mg tablet Take 1 tablet by mouth once daily    ? magnesium chloride (SLOW-MAG) 71.5 mg DR tablet Take by mouth once daily    ? metFORMIN (GLUCOPHAGE) 500 MG tablet Take 1,000 mg by mouth daily with breakfast    ? multivitamin tablet Take 1 tablet by mouth once daily    ? potassium chloride (KLOR-CON) 20 MEQ ER tablet Take 1 tablet by mouth once daily    ? potassium chloride 20 mEq/15 mL oral solution Take by mouth once daily    ? ?No facility-administered encounter medications on file as of 02/04/2022.  ? ?  ?ALLERGIES:   ?Patient has no known allergies. ?  ?SOCIAL HISTORY:  ?Social History  ? ?Socioeconomic History  ? Marital  status: Married  ?Tobacco Use  ? Smoking status: Former  ? Smokeless tobacco: Never  ?Vaping Use  ? Vaping Use: Never used  ?Substance and Sexual Activity  ? Alcohol use: Not Currently  ? Drug use: Not Currently  ? Sexual activity: Defer  ? ? ?FAMILY HISTORY:  ?Family History  ?Problem Relation Age of Onset  ? Diabetes Mother   ? No Known Problems Father   ?  ? ?GENERAL REVIEW OF SYSTEMS:  ? ?General ROS: negative for - chills, fatigue, fever, weight gain or weight loss ?Allergy and Immunology ROS: negative for - hives  ?Hematological and Lymphatic ROS: negative for - bleeding problems or bruising, negative for palpable nodes ?Endocrine ROS: negative for - heat or cold intolerance, hair changes ?Respiratory ROS: negative for - cough, shortness of breath or wheezing ?Cardiovascular ROS: no chest pain or  palpitations ?GI ROS: negative for nausea, vomiting, abdominal pain, diarrhea, constipation ?Musculoskeletal ROS: negative for - joint swelling or muscle pain ?Neurological ROS: negative for - confusion, syncope ?Dermatological ROS: negative for pruritus and rash ? ?PHYSICAL EXAM:  ?Vitals:  ? 02/04/22 0944  ?BP: (!) 152/86  ?Pulse: 85  ?.  ?Ht:154.9 cm (_0 ) Wt:59.8 kg (131 lb 12.8 oz) GEX:BMWU surface area is 1.6 meters squared. ?Body mass index is 24.9 kg/m?Marland Kitchen. ?  ?GENERAL: Alert, active, oriented x3 ? ?HEENT: Pupils equal reactive to light. Extraocular movements are intact. Sclera clear. Palpebral conjunctiva normal red color.Pharynx clear. ? ?NECK: Supple with no palpable mass and no adenopathy. ? ?LUNGS: Sound clear with no rales rhonchi or wheezes. ? ?HEART: Regular rhythm S1 and S2 without murmur. ? ?BREAST: There are multiple palpable lumps in the left breast.  No skin changes.  No nipple retraction.  No nipple discharge.  There is no axillary adenopathy. ? ?ABDOMEN: Soft and depressible, nontender with no palpable mass, no hepatomegaly.  ? ?EXTREMITIES: Well-developed well-nourished symmetrical with no dependent edema. ? ?NEUROLOGICAL: Awake alert oriented, facial expression symmetrical, moving all extremities. ?  ?   ?IMPRESSION:  ?  ? Malignant neoplasm of midline of left female breast (CMS-HCC) [C50.812]  ?Left breast invasive carcinoma, multiple sites. ER 90%, PR 11-50% positive, HER2 negative by IHC cT3 N0 ?-Patient completed neoadjuvant chemotherapy 2 weeks ago ?-Interval MRI of bilateral breast, personally evaluated the images.  There is no significant change on the size of the masses.  No new masses.  Biggest dimension of the mass is 9 cm x 4 cm. ?-I discussed with the patient that the fact that there is no significant decrease in size of the mass it does not mean that she is not responding adequately to the chemotherapy.  Still I do recommend to proceed with total mastectomy of the left breast.   I also discussed with patient the sentinel node biopsy. ?-I discussed with patient the risk of surgery that includes pain, infection, bleeding, seroma, hematoma, pneumothorax, lymphedema, risk of anesthesia that includes stroke, myocardial infarction, pneumonia, blood clots, among others. ?-The patient and her husband endorses they understood and agreed to proceed. ?-I discussed this case with the medical oncologist ?    ?  ?PLAN:  ?1.  Left breast total mastectomy and sentinel node biopsy ([id number], [id number]) ?2.  CBC, CMP done ?3.  Vascular surgery clearance to hold Plavix for 7-day and aspirin for 5 days ?4.  Contact us if you have any concern  ? ?Patient and her husband verbalized understanding, all questions were answered, and were agreeable with the plan outlined above.  ? ?  Herbert Pun, MD ? ?Electronically signed by Herbert Pun, MD ? ?

## 2022-02-05 NOTE — H&P (Signed)
HISTORY OF PRESENT ILLNESS:  ?  ?Tammy Nunez is a 71 y.o.female patient who comes for surgical evaluation for left breast cancer. ? ?Patient with left breast cancer diagnosed on June 2022.  At that time he was found to be multicentric with multiple masses positive for invasive mammary carcinoma with different grades.  She was treated initially with neoadjuvant chemotherapy.  She finished her last chemotherapy 2 weeks ago.  She endorses that it was very difficult for her.  The patient completed 4 cycles of ddAC and 12 cycles of Taxol.  She had multiple intervention due to side effects. ? ?Today the patient endorses feeling well.  She denies any pain.  There is no pain radiation.  There is no alleviating or aggravating factors.  Patient denies any weakness.  Patient denies any chest pain.  Patient denies any shortness of breath. ? ?Patient had multiple interval MRIs of the breast.  Last MRI was done 08/26/2021.  This shows persistent multicentric left breast invasive mammary carcinoma.  There has been no significant change on the size of the masses.  I personally evaluated the images.  No new masses on either breast. ?  ?PAST MEDICAL HISTORY:  ?Past Medical History:  ?Diagnosis Date  ? Diabetes mellitus without complication (CMS-HCC)   ? History of stroke   ? Hyperlipidemia   ? Hypertension   ?  ?  ?  ?PAST SURGICAL HISTORY:   ?Past Surgical History:  ?Procedure Laterality Date  ? carotid stent placment    ? TRANSCATHETER PLACEMENT STENT OPEN COMMON CAROTID / INNOMINATE ARTERY    ?    ?   ?MEDICATIONS:  ?Outpatient Encounter Medications as of 02/04/2022  ?Medication Sig Dispense Refill  ? acetaminophen (TYLENOL) 325 MG tablet Take 650 mg by mouth every 4 (four) hours as needed for Pain    ? amLODIPine (NORVASC) 10 MG tablet Take 5 mg by mouth once daily    ? aspirin 81 MG EC tablet Take 81 mg by mouth once daily    ? atorvastatin (LIPITOR) 80 MG tablet Take 40 mg by mouth once daily    ? carvediloL (COREG) 25 MG tablet  Take 6.25 mg by mouth 2 (two) times daily with meals    ? cholecalciferol, vitamin D3, 100 mcg (4,000 unit) Tab Take by mouth    ? clopidogreL (PLAVIX) 75 mg tablet Take 1 tablet (75 mg total) by mouth once daily 90 tablet 3  ? cyanocobalamin (VITAMIN B12) 1000 MCG tablet Take 1,000 mcg by mouth once daily    ? empagliflozin (JARDIANCE) 25 mg tablet Take 1 tablet by mouth once daily    ? lisinopriL (ZESTRIL) 40 MG tablet Take 40 mg by mouth once daily    ? loratadine (CLARITIN) 10 mg tablet Take 1 tablet by mouth once daily    ? magnesium chloride (SLOW-MAG) 71.5 mg DR tablet Take by mouth once daily    ? metFORMIN (GLUCOPHAGE) 500 MG tablet Take 1,000 mg by mouth daily with breakfast    ? multivitamin tablet Take 1 tablet by mouth once daily    ? potassium chloride (KLOR-CON) 20 MEQ ER tablet Take 1 tablet by mouth once daily    ? potassium chloride 20 mEq/15 mL oral solution Take by mouth once daily    ? ?No facility-administered encounter medications on file as of 02/04/2022.  ? ?  ?ALLERGIES:   ?Patient has no known allergies. ?  ?SOCIAL HISTORY:  ?Social History  ? ?Socioeconomic History  ? Marital   status: Married  ?Tobacco Use  ? Smoking status: Former  ? Smokeless tobacco: Never  ?Vaping Use  ? Vaping Use: Never used  ?Substance and Sexual Activity  ? Alcohol use: Not Currently  ? Drug use: Not Currently  ? Sexual activity: Defer  ? ? ?FAMILY HISTORY:  ?Family History  ?Problem Relation Age of Onset  ? Diabetes Mother   ? No Known Problems Father   ?  ? ?GENERAL REVIEW OF SYSTEMS:  ? ?General ROS: negative for - chills, fatigue, fever, weight gain or weight loss ?Allergy and Immunology ROS: negative for - hives  ?Hematological and Lymphatic ROS: negative for - bleeding problems or bruising, negative for palpable nodes ?Endocrine ROS: negative for - heat or cold intolerance, hair changes ?Respiratory ROS: negative for - cough, shortness of breath or wheezing ?Cardiovascular ROS: no chest pain or  palpitations ?GI ROS: negative for nausea, vomiting, abdominal pain, diarrhea, constipation ?Musculoskeletal ROS: negative for - joint swelling or muscle pain ?Neurological ROS: negative for - confusion, syncope ?Dermatological ROS: negative for pruritus and rash ? ?PHYSICAL EXAM:  ?Vitals:  ? 02/04/22 0944  ?BP: (!) 152/86  ?Pulse: 85  ?.  ?Ht:154.9 cm (5' 1") Wt:59.8 kg (131 lb 12.8 oz) BSA:Body surface area is 1.6 meters squared. ?Body mass index is 24.9 kg/m?.. ?  ?GENERAL: Alert, active, oriented x3 ? ?HEENT: Pupils equal reactive to light. Extraocular movements are intact. Sclera clear. Palpebral conjunctiva normal red color.Pharynx clear. ? ?NECK: Supple with no palpable mass and no adenopathy. ? ?LUNGS: Sound clear with no rales rhonchi or wheezes. ? ?HEART: Regular rhythm S1 and S2 without murmur. ? ?BREAST: There are multiple palpable lumps in the left breast.  No skin changes.  No nipple retraction.  No nipple discharge.  There is no axillary adenopathy. ? ?ABDOMEN: Soft and depressible, nontender with no palpable mass, no hepatomegaly.  ? ?EXTREMITIES: Well-developed well-nourished symmetrical with no dependent edema. ? ?NEUROLOGICAL: Awake alert oriented, facial expression symmetrical, moving all extremities. ?  ?   ?IMPRESSION:  ?  ? Malignant neoplasm of midline of left female breast (CMS-HCC) [C50.812]  ?Left breast invasive carcinoma, multiple sites. ER 90%, PR 11-50% positive, HER2 negative by IHC cT3 N0 ?-Patient completed neoadjuvant chemotherapy 2 weeks ago ?-Interval MRI of bilateral breast, personally evaluated the images.  There is no significant change on the size of the masses.  No new masses.  Biggest dimension of the mass is 9 cm x 4 cm. ?-I discussed with the patient that the fact that there is no significant decrease in size of the mass it does not mean that she is not responding adequately to the chemotherapy.  Still I do recommend to proceed with total mastectomy of the left breast.   I also discussed with patient the sentinel node biopsy. ?-I discussed with patient the risk of surgery that includes pain, infection, bleeding, seroma, hematoma, pneumothorax, lymphedema, risk of anesthesia that includes stroke, myocardial infarction, pneumonia, blood clots, among others. ?-The patient and her husband endorses they understood and agreed to proceed. ?-I discussed this case with the medical oncologist ?    ?  ?PLAN:  ?1.  Left breast total mastectomy and sentinel node biopsy ([id number], [id number]) ?2.  CBC, CMP done ?3.  Vascular surgery clearance to hold Plavix for 7-day and aspirin for 5 days ?4.  Contact us if you have any concern  ? ?Patient and her husband verbalized understanding, all questions were answered, and were agreeable with the plan outlined above.  ? ?  Dameon Soltis Cintron-Diaz, MD ? ?Electronically signed by Kaylynne Andres Cintron-Diaz, MD ? ?

## 2022-02-05 NOTE — Patient Instructions (Addendum)
Visit Information  ? ?Thank you for taking time to visit with me today. Please don't hesitate to contact me if I can be of assistance to you before our next scheduled telephone appointment. ? ?Following are the goals we discussed today:  ?Take medications as prescribed   ?Attend all scheduled provider appointments ?Call provider office for new concerns or questions  ?check blood sugar at prescribed times: twice daily ?check feet daily for cuts, sores or redness ?take the blood sugar meter to all doctor visits ?drink 6 to 8 glasses of water each day ?limit fast food meals to no more than 1 per week ?keep feet up while sitting ?check blood pressure weekly ?write blood pressure results in a log or diary ?keep a blood pressure log ?take blood pressure log to all doctor appointments ?call doctor for signs and symptoms of high blood pressure ?develop an action plan for high blood pressure ?keep all doctor appointments ?take medications for blood pressure exactly as prescribed ?report new symptoms to your doctor ?  ? ?Our next appointment is by telephone on 4/19 at 1100 ? ?Please call the care guide team at [phone] if you need to cancel or reschedule your appointment.  ? ?If you are experiencing a Mental Health or [place] or need someone to talk to, please call the Suicide and Crisis Lifeline: 988 ?call the [place] National Suicide Prevention Lifeline: 9254346928 or TTY: (951)299-2905 TTY 2296909686) to talk to a trained counselor ?call 1-800-273-TALK (toll free, 24 hour hotline) ?call 911  ? ?Following is a copy of your full care plan:  ?Care Plan : [place] of Care (Adult)  ?Updates made by Leona Singleton, RN since 02/07/2022 12:00 AM  ?  ? ?Problem: KNOWLEDGE DEFICIET RELATED TO SELF CARE MANAGEMENT OF [facility] CONDITIONS   ?Priority: Medium  ?  ? ?Long-Range Goal: PATIENT WILL WORK WITH CCM TEAM TO LEARN SELF HEALTH MANAGEMENT OF CHRONIC MEDICAL CONDITIONS AND EXPRESS EMOTIONS OF  UPCOMING SURGERY   ?Start Date: 02/05/2022  ?Expected End Date: 02/06/2023  ?Priority: Medium  ?Note:   ?Current Barriers:  ?Knowledge Deficits related to plan of care for management of HTN and DMII  ?HX. HTN, does not monitor blood pressures at home often.  Agrees to start monitoring  at least twice a week.  hX diabetes,  fasting blood sugar 180 with recent ranges 80-90'S.  Denies any hypo or hyperglycemia.  Denies any concerns or needs ? ?RNCM Clinical Goal(s):  ?Patient will demonstrate understanding of rationale for each prescribed medication as evidenced by [employer]    ?demonstrate ongoing health management independence as evidenced by maintaining Hgb A1C below 6, blood pressures in normal range, obtaining mastectomy surgery        through collaboration with RN Care manager, provider, and care team.  ? ?Interventions: ?1:1 collaboration with primary care provider regarding development and update of comprehensive plan of care as evidenced by provider attestation and co-signature ?Inter-disciplinary care team collaboration (see longitudinal plan of care) ?Evaluation of current treatment plan related to  self management and patient's adherence to plan as established by provider ? ? ?Diabetes:  (Status: New goal.) Long Term Goal  ? ?Lab Results  ?Component Value Date  ? HGBA1C 5.5 10/09/2021  ? @ ?Assessed patient's understanding of A1c goal: <6.5% ?Provided education to patient about basic DM disease process; ?Reviewed medications with patient and discussed importance of medication adherence;        ?Discussed plans with patient for  ongoing care management follow up and provided patient with direct contact information for care management team;      ?Provided patient with written educational materials related to hypo and hyperglycemia and importance of correct treatment;       ?Advised patient, providing education and rationale, to check cbg twice daily and record        ?Screening for signs and symptoms  of depression related to chronic disease state;        ? ?Hypertension: (Status: New goal.) Long Term Goal  ?Last practice recorded BP readings:  ?BP Readings from Last 3 Encounters:  ?01/21/22 118/74  ?01/14/22 136/75  ?01/14/22 126/60  ?Most recent eGFR/CrCl: No results found for: EGFR  No components found for: CRCL ? ?Evaluation of current treatment plan related to hypertension self management and patient's adherence to plan as established by provider;   ?Provided education to patient re: stroke prevention, s/s of heart attack and stroke; ?Reviewed medications with patient and discussed importance of compliance;  ?Discussed complications of poorly controlled blood pressure such as heart disease, stroke, circulatory complications, vision complications, kidney impairment, sexual dysfunction;  ?Assessed social determinant of health barriers;  ? ?Patient Goals/Self-Care Activities: ?Take medications as prescribed   ?Attend all scheduled provider appointments ?Call provider office for new concerns or questions  ?check blood sugar at prescribed times: twice daily ?check feet daily for cuts, sores or redness ?take the blood sugar meter to all doctor visits ?drink 6 to 8 glasses of water each day ?limit fast food meals to no more than 1 per week ?keep feet up while sitting ?check blood pressure weekly ?write blood pressure results in a log or diary ?keep a blood pressure log ?take blood pressure log to all doctor appointments ?call doctor for signs and symptoms of high blood pressure ?develop an action plan for high blood pressure ?keep all doctor appointments ?take medications for blood pressure exactly as prescribed ?report new symptoms to your doctor ?  ? ? ?Consent to CCM Services: ?Ms. Goble was given information about Chronic Care Management services including:  ?CCM service includes personalized support from designated clinical staff supervised by her physician, including individualized plan of care and  coordination with other care providers ?24/7 contact phone numbers for assistance for urgent and routine care needs. ?Service will only be billed when office clinical staff spend 20 minutes or more in a month to coordinate care. ?Only one practitioner may furnish and bill the service in a calendar month. ?The patient may stop CCM services at any time (effective at the end of the month) by phone call to the office staff. ?The patient will be responsible for cost sharing (co-pay) of up to 20% of the service fee (after annual deductible is met). ? ?Patient agreed to services and verbal consent obtained.  ? ?The patient verbalized understanding of instructions, educational materials, and care plan provided today and agreed to receive a mailed copy of patient instructions, educational materials, and care plan.  ? ?The care management team will reach out to the patient again over the next 45 days.  ? ? ? Hubert Azure RN, MSN ?RN Care Management Coordinator ?[place] ?[phone] ?[email or web address] ? ?

## 2022-02-06 ENCOUNTER — Telehealth: Payer: Self-pay

## 2022-02-06 ENCOUNTER — Other Ambulatory Visit: Payer: Self-pay

## 2022-02-06 ENCOUNTER — Ambulatory Visit
Admission: RE | Admit: 2022-02-06 | Discharge: 2022-02-06 | Disposition: A | Discharge status: Home / Self Care | Payer: PPO | Source: Ambulatory Visit | Attending: Oncology | Admitting: Oncology

## 2022-02-06 DIAGNOSIS — C50912 Malignant neoplasm of unspecified site of left female breast: Secondary | ICD-10-CM | POA: Diagnosis not present

## 2022-02-06 MED ORDER — GADOBUTROL 1 MMOL/ML IV SOLN
6.0000 mL | Freq: Once | INTRAVENOUS | Status: AC | PRN
  Administered 2022-02-06: 6 mL via INTRAVENOUS

## 2022-02-06 NOTE — Telephone Encounter (Signed)
Patient scheduled for surgery (total mastectomy) on 02/17/22. Please schedule patient for MD only 2-3 weeks after surgery and inform pt of appt. Thanks  ?

## 2022-02-07 NOTE — Chronic Care Management (AMB) (Signed)
Chronic Care Management   CCM RN Visit Note  02/07/2022 Name: Tammy Nunez MRN: [id number] DOB: 05-Sep-1951  Subjective: Tammy Nunez is a 71 y.o. year old female who is a primary care patient of McLean-Scocuzza, Pasty Spillers, MD. The care management team was consulted for assistance with disease management and care coordination needs.    Engaged with patient by telephone for initial visit in response to provider referral for case management and/or care coordination services.   Consent to Services:  The patient was given the following information about Chronic Care Management services today, agreed to services, and gave verbal consent: 1. CCM service includes personalized support from designated clinical staff supervised by the primary care provider, including individualized plan of care and coordination with other care providers 2. 24/7 contact phone numbers for assistance for urgent and routine care needs. 3. Service will only be billed when office clinical staff spend 20 minutes or more in a month to coordinate care. 4. Only one practitioner may furnish and bill the service in a calendar month. 5.The patient may stop CCM services at any time (effective at the end of the month) by phone call to the office staff. 6. The patient will be responsible for cost sharing (co-pay) of up to 20% of the service fee (after annual deductible is met). Patient agreed to services and consent obtained.  Patient agreed to services and verbal consent obtained.   Assessment: Review of patient past medical history, allergies, medications, health status, including review of consultants reports, laboratory and other test data, was performed as part of comprehensive evaluation and provision of chronic care management services.   SDOH (Social Determinants of Health) assessments and interventions performed:  SDOH Interventions    Flowsheet Row Most Recent Value  SDOH Interventions   Food Insecurity Interventions  Intervention Not Indicated  Financial Strain Interventions Intervention Not Indicated  Housing Interventions Intervention Not Indicated  Intimate Partner Violence Interventions Intervention Not Indicated  Transportation Interventions Intervention Not Indicated        CCM Care Plan  No Known Allergies  Outpatient Encounter Medications as of 02/05/2022  Medication Sig   amLODipine (NORVASC) 10 MG tablet Take 1 tablet (10 mg total) by mouth daily.   aspirin 81 MG EC tablet Take 1 tablet (81 mg total) by mouth daily.   atorvastatin (LIPITOR) 80 MG tablet Take 1 tablet (80 mg total) by mouth daily.   carvedilol (COREG) 25 MG tablet Take 1 tablet (25 mg total) by mouth 2 (two) times daily with a meal.   clopidogrel (PLAVIX) 75 MG tablet Take 1 tablet (75 mg total) by mouth daily. Further refills [facility]   Continuous Blood Gluc Sensor (FREESTYLE LIBRE 2 SENSOR) MISC USE TO CHECK BLOOD SUGAR AT LEAST EVERY 8 HOURS   empagliflozin (JARDIANCE) 25 MG TABS tablet Take 1 tablet (25 mg total) by mouth daily.   lisinopril (ZESTRIL) 40 MG tablet Take 1 tablet (40 mg total) by mouth daily.   loperamide (IMODIUM) 2 MG capsule Take 1 capsule (2 mg total) by mouth See admin instructions. Take 4mg  at the onset of diarrhea, and then 2mg  after each loose bowel movement, maximum 16mg  per 24 hours.   loratadine (CLARITIN) 10 MG tablet Take 1 tablet (10 mg total) by mouth daily.   magnesium chloride (SLOW-MAG) 64 MG TBEC SR tablet Take 1 tablet (64 mg total) by mouth daily.   Multiple Vitamin (MULTIVITAMIN) tablet Take 1 tablet by mouth daily.   ondansetron (ZOFRAN) 8 MG tablet Take  1 tablet (8 mg total) by mouth 2 (two) times daily as needed. Start on the third day after chemotherapy.   potassium chloride SA (KLOR-CON M20) 20 MEQ tablet Take 1 tablet (20 mEq total) by mouth daily.   prochlorperazine (COMPAZINE) 10 MG tablet Take 1 tablet (10 mg total) by mouth every 6 (six) hours as needed (Nausea or  vomiting).   vitamin B-12 (CYANOCOBALAMIN) 1000 MCG tablet Take 1 tablet (1,000 mcg total) by mouth daily.   Vitamin D, Cholecalciferol, 50 MCG (2000 UT) CAPS Take 2,000 Units by mouth.   blood glucose meter kit and supplies 1 each by Other route 4 (four) times daily. Dispense based on patient and insurance preference. Four times daily as directed. (FOR ICD-10 E10.9, E11.9).   dexamethasone (DECADRON) 2 MG tablet Take 1 tablet (2 mg total) by mouth See admin instructions. Take 2mg  daily for 2 days after each chemotherapy.   lidocaine-prilocaine (EMLA) cream Apply to affected area once   Facility-Administered Encounter Medications as of 02/05/2022  Medication   heparin lock flush 100 UNIT/ML injection    Patient Active Problem List   Diagnosis Date Noted   Hyperlipidemia 12/11/2021   Annual physical exam 10/09/2021   Hypomagnesemia 09/13/2021   Chemotherapy induced nausea and vomiting 06/28/2021   Numbness 06/03/2021   Goals of care, counseling/discussion 05/16/2021   Breast cancer in female [facility]) 05/08/2021   Invasive carcinoma of breast (HCC) 05/08/2021   Abnormal mammogram of left breast 04/25/2021   Obesity, diabetes, and hypertension syndrome (HCC) 10/11/2020   Hypertension associated with diabetes (HCC) 10/11/2020   Cataract of both eyes 12/28/2019   Vitamin D deficiency 12/11/2019   History of stroke 09/07/2019   Left hemiparesis (HCC) 09/07/2019   Carotid stenosis, symptomatic, with infarction (HCC) 07/20/2019   Type 2 diabetes mellitus with hyperglycemia, without long-term current use of insulin (HCC)    AKI (acute kidney injury) (HCC)    Essential hypertension    Seizures (HCC)    New onset type 2 diabetes mellitus (HCC)     Conditions to be addressed/monitored:HTN and DMII  Care Plan : RNCM   General Plan of Care (Adult)  Updates made by Maple Mirza, RN since 02/07/2022 12:00 AM     Problem: KNOWLEDGE DEFICIET RELATED TO SELF CARE MANAGEMENT OF [name]  CONDITIONS   Priority: Medium     Long-Range Goal: PATIENT WILL WORK WITH CCM TEAM TO LEARN SELF HEALTH MANAGEMENT OF CHRONIC MEDICAL CONDITIONS AND EXPRESS EMOTIONS OF UPCOMING SURGERY   Start Date: 02/05/2022  Expected End Date: 02/06/2023  Priority: Medium  Note:   Current Barriers:  Knowledge Deficits related to plan of care for management of HTN and DMII  HX. HTN, does not monitor blood pressures at home often.  Agrees to start monitoring  at least twice a week.  hX diabetes,  fasting blood sugar 180 with recent ranges 80-90'S.  Denies any hypo or hyperglycemia.  Denies any concerns or needs  RNCM Clinical Goal(s):  Patient will demonstrate understanding of rationale for each prescribed medication as evidenced by MEDICATION COMPLIANCE    demonstrate ongoing health management independence as evidenced by maintaining Hgb A1C below 6, blood pressures in normal range, obtaining mastectomy surgery        through collaboration with RN Care manager, provider, and care team.   Interventions: 1:1 collaboration with primary care provider regarding development and update of comprehensive plan of care as evidenced by provider attestation and co-signature Inter-disciplinary care team collaboration (see longitudinal plan  of care) Evaluation of current treatment plan related to  self management and patient's adherence to plan as established by provider   Diabetes:  (Status: New goal.) Long Term Goal   Lab Results  Component Value Date   HGBA1C 5.5 10/09/2021   @ Assessed patient's understanding of A1c goal: <6.5% Provided education to patient about basic DM disease process; Reviewed medications with patient and discussed importance of medication adherence;        Discussed plans with patient for ongoing care management follow up and provided patient with direct contact information for care management team;      Provided patient with written educational materials related to hypo and hyperglycemia  and importance of correct treatment;       Advised patient, providing education and rationale, to check cbg twice daily and record        Screening for signs and symptoms of depression related to chronic disease state;         Hypertension: (Status: New goal.) Long Term Goal  Last practice recorded BP readings:  BP Readings from Last 3 Encounters:  01/21/22 118/74  01/14/22 136/75  01/14/22 126/60  Most recent eGFR/CrCl: No results found for: EGFR  No components found for: CRCL  Evaluation of current treatment plan related to hypertension self management and patient's adherence to plan as established by provider;   Provided education to patient re: stroke prevention, s/s of heart attack and stroke; Reviewed medications with patient and discussed importance of compliance;  Discussed complications of poorly controlled blood pressure such as heart disease, stroke, circulatory complications, vision complications, kidney impairment, sexual dysfunction;  Assessed social determinant of health barriers;   Patient Goals/Self-Care Activities: Take medications as prescribed   Attend all scheduled provider appointments Call provider office for new concerns or questions  check blood sugar at prescribed times: twice daily check feet daily for cuts, sores or redness take the blood sugar meter to all doctor visits drink 6 to 8 glasses of water each day limit fast food meals to no more than 1 per week keep feet up while sitting check blood pressure weekly write blood pressure results in a log or diary keep a blood pressure log take blood pressure log to all doctor appointments call doctor for signs and symptoms of high blood pressure develop an action plan for high blood pressure keep all doctor appointments take medications for blood pressure exactly as prescribed report new symptoms to your doctor     Plan:The care management team will reach out to the patient again over the next 30  days.  Rhae Lerner RN, MSN RN Care Management Coordinator [facility] [phone] [email or web address]

## 2022-02-11 ENCOUNTER — Other Ambulatory Visit: Payer: Self-pay

## 2022-02-11 ENCOUNTER — Other Ambulatory Visit
Admission: RE | Admit: 2022-02-11 | Discharge: 2022-02-11 | Disposition: A | Discharge status: Home / Self Care | Payer: PPO | Source: Ambulatory Visit | Attending: General Surgery | Admitting: General Surgery

## 2022-02-11 HISTORY — DX: Anemia, unspecified: D64.9

## 2022-02-11 NOTE — Patient Instructions (Addendum)
Your procedure is scheduled on: 02/17/22 - Monday ?Report to the Registration Desk on the 1st floor of the [facility]. ?To find out your arrival time, please call [phone] between 1PM - 3PM on: 02/14/22 - Friday ? ?REMEMBER: ?Instructions that are not followed completely may result in serious medical risk, up to and including death; or upon the discretion of your surgeon and anesthesiologist your surgery may need to be rescheduled. ? ?Do not eat food or drink any fluids after midnight the night before surgery.  ?No gum chewing, lozengers or hard candies. ? ? ?TAKE THESE MEDICATIONS THE MORNING OF SURGERY WITH A SIP OF WATER: ? ?- amLODipine (NORVASC) 10 MG tablet ?- carvedilol (COREG) 25 MG tablet ? ? ?STOP TAKING -  ?empagliflozin (JARDIANCE) 25 MG TABS tablet - Stop taking beginning 02/14/22. ? ?Follow recommendations from Cardiologist, Pulmonologist or PCP regarding stopping Aspirin, Coumadin, Plavix, Eliquis, Pradaxa, or Pletal. Hold Plavix 7 days prior to surgery beginning 03/19, and hold Aspirin 81 mg 5 days prior to surgery beginning 03/17. ? ?One week prior to surgery: ?Stop Anti-inflammatories (NSAIDS) such as Advil, Aleve, Ibuprofen, Motrin, Naproxen, Naprosyn and Aspirin based products such as Excedrin, Goodys Powder, BC Powder. ? ?Stop ANY OVER THE COUNTER supplements until after surgery. ? ?You may take Tylenol if needed for pain up until the day of surgery. ? ?No Alcohol for 24 hours before or after surgery. ? ?No Smoking including e-cigarettes for 24 hours prior to surgery.  ?No chewable tobacco products for at least 6 hours prior to surgery.  ?No nicotine patches on the day of surgery. ? ?Do not use any "recreational" drugs for at least a week prior to your surgery.  ?Please be advised that the combination of cocaine and anesthesia may have negative outcomes, up to and including death. ?If you test positive for cocaine, your surgery will be cancelled. ? ?On the morning of surgery brush your  teeth with toothpaste and water, you may rinse your mouth with mouthwash if you wish. ?Do not swallow any toothpaste or mouthwash. ? ?Use CHG Soap or wipes as directed on instruction sheet. ? ?Do not wear jewelry, make-up, hairpins, clips or nail polish. ? ?Do not wear lotions, powders, or perfumes.  ? ?Do not shave body from the neck down 48 hours prior to surgery just in case you cut yourself which could leave a site for infection.  ?Also, freshly shaved skin may become irritated if using the CHG soap. ? ?Contact lenses, hearing aids and dentures may not be worn into surgery. ? ?Do not bring valuables to the hospital. [facility] is not responsible for any missing/lost belongings or valuables.  ? ?Notify your doctor if there is any change in your medical condition (cold, fever, infection). ? ?Wear comfortable clothing (specific to your surgery type) to the hospital. ? ?After surgery, you can help prevent lung complications by doing breathing exercises.  ?Take deep breaths and cough every 1-2 hours. Your doctor may order a device called an Incentive Spirometer to help you take deep breaths. ?When coughing or sneezing, hold a pillow firmly against your incision with both hands. This is called ?splinting.? Doing this helps protect your incision. It also decreases belly discomfort. ? ?If you are being admitted to the hospital overnight, leave your suitcase in the car. ?After surgery it may be brought to your room. ? ?If you are being discharged the day of surgery, you will not be allowed to drive home. ?You will need a responsible  adult (18 years or older) to drive you home and stay with you that night.  ? ?If you are taking public transportation, you will need to have a responsible adult (18 years or older) with you. ?Please confirm with your physician that it is acceptable to use public transportation.  ? ?Please call the [facility] at [phone] if you have any questions about these  instructions. ? ?Surgery Visitation Policy: ? ?Patients undergoing a surgery or procedure may have two family members or support persons with them as long as the person is not COVID-19 positive or experiencing its symptoms.  ? ?Inpatient Visitation:   ? ?Visiting hours are 7 a.m. to 8 p.m. ?Up to four visitors are allowed at one time in a patient room, including children. The visitors may rotate out with other people during the day. One designated support person (adult) may remain overnight. ? ?All Areas: ?All visitors must pass COVID-19 screenings, use hand sanitizer when entering and exiting the patient?s room and wear a mask at all times, including in the patient?s room. ?Patients must also wear a mask when staff or their visitor are in the room. ?Masking is required regardless of vaccination status.  ?

## 2022-02-17 ENCOUNTER — Encounter
Admission: RE | Disposition: A | Discharge status: Home / Self Care | Payer: Self-pay | Source: Home / Self Care | Attending: General Surgery

## 2022-02-17 ENCOUNTER — Other Ambulatory Visit: Payer: Self-pay

## 2022-02-17 ENCOUNTER — Observation Stay
Admission: RE | Admit: 2022-02-17 | Discharge: 2022-02-18 | Disposition: A | Discharge status: Home / Self Care | Payer: PPO | Attending: General Surgery | Admitting: General Surgery

## 2022-02-17 ENCOUNTER — Ambulatory Visit: Payer: PPO | Admitting: Urgent Care

## 2022-02-17 ENCOUNTER — Ambulatory Visit: Payer: PPO | Admitting: Anesthesiology

## 2022-02-17 ENCOUNTER — Telehealth: Payer: Self-pay | Admitting: Internal Medicine

## 2022-02-17 ENCOUNTER — Ambulatory Visit
Admission: RE | Admit: 2022-02-17 | Discharge: 2022-02-17 | Disposition: A | Discharge status: Home / Self Care | Payer: PPO | Source: Ambulatory Visit | Attending: General Surgery | Admitting: General Surgery

## 2022-02-17 ENCOUNTER — Encounter: Payer: Self-pay | Admitting: General Surgery

## 2022-02-17 DIAGNOSIS — C50812 Malignant neoplasm of overlapping sites of left female breast: Principal | ICD-10-CM | POA: Insufficient documentation

## 2022-02-17 DIAGNOSIS — Z79899 Other long term (current) drug therapy: Secondary | ICD-10-CM | POA: Insufficient documentation

## 2022-02-17 DIAGNOSIS — Z7984 Long term (current) use of oral hypoglycemic drugs: Secondary | ICD-10-CM | POA: Diagnosis not present

## 2022-02-17 DIAGNOSIS — I10 Essential (primary) hypertension: Secondary | ICD-10-CM | POA: Insufficient documentation

## 2022-02-17 DIAGNOSIS — E119 Type 2 diabetes mellitus without complications: Secondary | ICD-10-CM | POA: Diagnosis not present

## 2022-02-17 DIAGNOSIS — C50912 Malignant neoplasm of unspecified site of left female breast: Secondary | ICD-10-CM | POA: Diagnosis present

## 2022-02-17 DIAGNOSIS — Z7982 Long term (current) use of aspirin: Secondary | ICD-10-CM | POA: Diagnosis not present

## 2022-02-17 DIAGNOSIS — Z170 Estrogen receptor positive status [ER+]: Secondary | ICD-10-CM | POA: Diagnosis not present

## 2022-02-17 DIAGNOSIS — Z87891 Personal history of nicotine dependence: Secondary | ICD-10-CM | POA: Insufficient documentation

## 2022-02-17 DIAGNOSIS — C50919 Malignant neoplasm of unspecified site of unspecified female breast: Secondary | ICD-10-CM | POA: Diagnosis present

## 2022-02-17 DIAGNOSIS — Z7902 Long term (current) use of antithrombotics/antiplatelets: Secondary | ICD-10-CM | POA: Diagnosis not present

## 2022-02-17 HISTORY — DX: Malignant neoplasm of unspecified site of unspecified female breast: C50.919

## 2022-02-17 HISTORY — PX: SENTINEL NODE BIOPSY: SHX6608

## 2022-02-17 HISTORY — PX: TOTAL MASTECTOMY: SHX6129

## 2022-02-17 LAB — GLUCOSE, CAPILLARY
Glucose-Capillary: 140 mg/dL — ABNORMAL HIGH (ref 70–99)
Glucose-Capillary: 171 mg/dL — ABNORMAL HIGH (ref 70–99)
Glucose-Capillary: 140 mg/dL — ABNORMAL HIGH (ref 70–99)

## 2022-02-17 SURGERY — MASTECTOMY, SIMPLE
Anesthesia: General | Site: Breast | Laterality: Left

## 2022-02-17 MED ORDER — ONDANSETRON HCL 4 MG/2ML IJ SOLN
INTRAMUSCULAR | Status: AC
  Filled 2022-02-17: qty 2

## 2022-02-17 MED ORDER — HYDROCODONE-ACETAMINOPHEN 5-325 MG PO TABS
1.0000 | ORAL_TABLET | ORAL | Status: DC | PRN
  Administered 2022-02-17 (×2): 1 via ORAL
  Filled 2022-02-17: qty 1
  Filled 2022-02-17: qty 2

## 2022-02-17 MED ORDER — METHYLENE BLUE 0.5 % INJ SOLN
INTRAVENOUS | Status: AC
  Filled 2022-02-17: qty 10

## 2022-02-17 MED ORDER — CEFAZOLIN SODIUM-DEXTROSE 2-4 GM/100ML-% IV SOLN
2.0000 g | INTRAVENOUS | Status: AC
  Administered 2022-02-17: 2 g via INTRAVENOUS

## 2022-02-17 MED ORDER — AMLODIPINE BESYLATE 10 MG PO TABS
10.0000 mg | ORAL_TABLET | Freq: Every day | ORAL | Status: DC
  Administered 2022-02-18: 10 mg via ORAL
  Filled 2022-02-17: qty 1

## 2022-02-17 MED ORDER — GLYCOPYRROLATE 0.2 MG/ML IJ SOLN
INTRAMUSCULAR | Status: AC
Start: 2022-02-17 — End: ?
  Filled 2022-02-17: qty 1

## 2022-02-17 MED ORDER — ONDANSETRON HCL 4 MG/2ML IJ SOLN: 4.0000 mg | Freq: Once | INTRAMUSCULAR | Status: DC | PRN

## 2022-02-17 MED ORDER — BUPIVACAINE-EPINEPHRINE (PF) 0.5% -1:200000 IJ SOLN
INTRAMUSCULAR | Status: AC
  Filled 2022-02-17: qty 30

## 2022-02-17 MED ORDER — LIDOCAINE HCL (PF) 2 % IJ SOLN
INTRAMUSCULAR | Status: AC
  Filled 2022-02-17: qty 5

## 2022-02-17 MED ORDER — PHENYLEPHRINE 40 MCG/ML (10ML) SYRINGE FOR IV PUSH (FOR BLOOD PRESSURE SUPPORT)
PREFILLED_SYRINGE | INTRAVENOUS | Status: AC
  Filled 2022-02-17: qty 10

## 2022-02-17 MED ORDER — DEXAMETHASONE SODIUM PHOSPHATE 10 MG/ML IJ SOLN
INTRAMUSCULAR | Status: AC
  Filled 2022-02-17: qty 1

## 2022-02-17 MED ORDER — CHLORHEXIDINE GLUCONATE 0.12 % MT SOLN
OROMUCOSAL | Status: AC
  Administered 2022-02-17: 15 mL via OROMUCOSAL
  Filled 2022-02-17: qty 15

## 2022-02-17 MED ORDER — LIDOCAINE HCL (CARDIAC) PF 100 MG/5ML IV SOSY
PREFILLED_SYRINGE | INTRAVENOUS | Status: DC | PRN
  Administered 2022-02-17: 50 mg via INTRAVENOUS

## 2022-02-17 MED ORDER — PANTOPRAZOLE SODIUM 40 MG IV SOLR
40.0000 mg | Freq: Every day | INTRAVENOUS | Status: DC
  Administered 2022-02-17: 40 mg via INTRAVENOUS
  Filled 2022-02-17: qty 10

## 2022-02-17 MED ORDER — OXYCODONE HCL 5 MG/5ML PO SOLN: 5.0000 mg | Freq: Once | ORAL | Status: DC | PRN

## 2022-02-17 MED ORDER — ORAL CARE MOUTH RINSE: 15.0000 mL | Freq: Once | OROMUCOSAL | Status: AC

## 2022-02-17 MED ORDER — PROPOFOL 10 MG/ML IV BOLUS
INTRAVENOUS | Status: AC
  Filled 2022-02-17: qty 20

## 2022-02-17 MED ORDER — LORATADINE 10 MG PO TABS
10.0000 mg | ORAL_TABLET | Freq: Every day | ORAL | Status: DC
  Administered 2022-02-18: 10 mg via ORAL
  Filled 2022-02-17: qty 1

## 2022-02-17 MED ORDER — MIDAZOLAM HCL 2 MG/2ML IJ SOLN
INTRAMUSCULAR | Status: DC | PRN
  Administered 2022-02-17 (×2): 1 mg via INTRAVENOUS

## 2022-02-17 MED ORDER — EPHEDRINE 5 MG/ML INJ
INTRAVENOUS | Status: AC
  Filled 2022-02-17: qty 5

## 2022-02-17 MED ORDER — LISINOPRIL 20 MG PO TABS
40.0000 mg | ORAL_TABLET | Freq: Every day | ORAL | Status: DC
  Administered 2022-02-18: 40 mg via ORAL
  Filled 2022-02-17: qty 2

## 2022-02-17 MED ORDER — ONDANSETRON HCL 4 MG/2ML IJ SOLN
INTRAMUSCULAR | Status: DC | PRN
  Administered 2022-02-17: 4 mg via INTRAVENOUS

## 2022-02-17 MED ORDER — MAGNESIUM CHLORIDE 64 MG PO TBEC
1.0000 | DELAYED_RELEASE_TABLET | Freq: Every day | ORAL | Status: DC
  Administered 2022-02-18: 64 mg via ORAL
  Filled 2022-02-17: qty 1

## 2022-02-17 MED ORDER — PROPOFOL 10 MG/ML IV BOLUS
INTRAVENOUS | Status: DC | PRN
  Administered 2022-02-17: 120 mg via INTRAVENOUS

## 2022-02-17 MED ORDER — ROCURONIUM BROMIDE 100 MG/10ML IV SOLN
INTRAVENOUS | Status: DC | PRN
  Administered 2022-02-17: 50 mg via INTRAVENOUS

## 2022-02-17 MED ORDER — BUPIVACAINE LIPOSOME 1.3 % IJ SUSP
INTRAMUSCULAR | Status: AC
  Filled 2022-02-17: qty 20

## 2022-02-17 MED ORDER — SODIUM CHLORIDE (PF) 0.9 % IJ SOLN
INTRAMUSCULAR | Status: AC
  Filled 2022-02-17: qty 50

## 2022-02-17 MED ORDER — CHLORHEXIDINE GLUCONATE 0.12 % MT SOLN: 15.0000 mL | Freq: Once | OROMUCOSAL | Status: AC

## 2022-02-17 MED ORDER — FAMOTIDINE 20 MG PO TABS
ORAL_TABLET | ORAL | Status: AC
  Administered 2022-02-17: 20 mg via ORAL
  Filled 2022-02-17: qty 1

## 2022-02-17 MED ORDER — BUPIVACAINE-EPINEPHRINE 0.5% -1:200000 IJ SOLN
INTRAMUSCULAR | Status: DC | PRN
  Administered 2022-02-17: 50 mL via INTRAMUSCULAR

## 2022-02-17 MED ORDER — ENOXAPARIN SODIUM 40 MG/0.4ML IJ SOSY
40.0000 mg | PREFILLED_SYRINGE | INTRAMUSCULAR | Status: DC
  Administered 2022-02-18: 40 mg via SUBCUTANEOUS
  Filled 2022-02-17: qty 0.4

## 2022-02-17 MED ORDER — ACETAMINOPHEN 10 MG/ML IV SOLN: 1000.0000 mg | Freq: Once | INTRAVENOUS | Status: DC | PRN

## 2022-02-17 MED ORDER — MIDAZOLAM HCL 2 MG/2ML IJ SOLN
INTRAMUSCULAR | Status: AC
  Filled 2022-02-17: qty 2

## 2022-02-17 MED ORDER — SODIUM CHLORIDE 0.9 % IV SOLN: INTRAVENOUS | Status: DC

## 2022-02-17 MED ORDER — GLYCOPYRROLATE 0.2 MG/ML IJ SOLN
INTRAMUSCULAR | Status: DC | PRN
  Administered 2022-02-17: .2 mg via INTRAVENOUS

## 2022-02-17 MED ORDER — DEXAMETHASONE SODIUM PHOSPHATE 10 MG/ML IJ SOLN
INTRAMUSCULAR | Status: DC | PRN
Start: 2022-02-17 — End: 2022-02-17
  Administered 2022-02-17: 10 mg via INTRAVENOUS

## 2022-02-17 MED ORDER — APREPITANT 40 MG PO CAPS
ORAL_CAPSULE | ORAL | Status: AC
  Administered 2022-02-17: 40 mg via ORAL
  Filled 2022-02-17: qty 1

## 2022-02-17 MED ORDER — FENTANYL CITRATE (PF) 100 MCG/2ML IJ SOLN
INTRAMUSCULAR | Status: DC | PRN
Start: 2022-02-17 — End: 2022-02-17
  Administered 2022-02-17 (×2): 50 ug via INTRAVENOUS

## 2022-02-17 MED ORDER — ACETAMINOPHEN 10 MG/ML IV SOLN
INTRAVENOUS | Status: DC | PRN
  Administered 2022-02-17: 1000 mg via INTRAVENOUS

## 2022-02-17 MED ORDER — CARVEDILOL 25 MG PO TABS
25.0000 mg | ORAL_TABLET | Freq: Two times a day (BID) | ORAL | Status: DC
Start: 2022-02-17 — End: 2022-02-18
  Administered 2022-02-17 – 2022-02-18 (×2): 25 mg via ORAL
  Filled 2022-02-17 (×2): qty 1

## 2022-02-17 MED ORDER — OXYCODONE HCL 5 MG PO TABS: 5.0000 mg | ORAL_TABLET | Freq: Once | ORAL | Status: DC | PRN

## 2022-02-17 MED ORDER — SODIUM CHLORIDE 0.9 % IV SOLN
INTRAVENOUS | Status: DC
Start: 2022-02-17 — End: 2022-02-17

## 2022-02-17 MED ORDER — PHENYLEPHRINE HCL (PRESSORS) 10 MG/ML IV SOLN
INTRAVENOUS | Status: DC | PRN
  Administered 2022-02-17: 120 ug via INTRAVENOUS
  Administered 2022-02-17: 80 ug via INTRAVENOUS
  Administered 2022-02-17: 120 ug via INTRAVENOUS

## 2022-02-17 MED ORDER — MORPHINE SULFATE (PF) 4 MG/ML IV SOLN: 4.0000 mg | INTRAVENOUS | Status: DC | PRN

## 2022-02-17 MED ORDER — ONDANSETRON HCL 4 MG/2ML IJ SOLN: 4.0000 mg | Freq: Four times a day (QID) | INTRAMUSCULAR | Status: DC | PRN

## 2022-02-17 MED ORDER — APREPITANT 40 MG PO CAPS: 40.0000 mg | ORAL_CAPSULE | Freq: Once | ORAL | Status: AC

## 2022-02-17 MED ORDER — SCOPOLAMINE 1 MG/3DAYS TD PT72: 1.0000 | MEDICATED_PATCH | TRANSDERMAL | Status: DC

## 2022-02-17 MED ORDER — ASPIRIN EC 81 MG PO TBEC
81.0000 mg | DELAYED_RELEASE_TABLET | Freq: Every day | ORAL | Status: DC
  Administered 2022-02-18: 81 mg via ORAL
  Filled 2022-02-17: qty 1

## 2022-02-17 MED ORDER — ROCURONIUM BROMIDE 10 MG/ML (PF) SYRINGE
PREFILLED_SYRINGE | INTRAVENOUS | Status: AC
  Filled 2022-02-17: qty 10

## 2022-02-17 MED ORDER — SCOPOLAMINE 1 MG/3DAYS TD PT72
MEDICATED_PATCH | TRANSDERMAL | Status: AC
  Administered 2022-02-17: 1.5 mg via TRANSDERMAL
  Filled 2022-02-17: qty 1

## 2022-02-17 MED ORDER — ACETAMINOPHEN 10 MG/ML IV SOLN
INTRAVENOUS | Status: AC
  Filled 2022-02-17: qty 100

## 2022-02-17 MED ORDER — FENTANYL CITRATE (PF) 100 MCG/2ML IJ SOLN: 25.0000 ug | INTRAMUSCULAR | Status: DC | PRN

## 2022-02-17 MED ORDER — LACTATED RINGERS IV SOLN: INTRAVENOUS | Status: DC

## 2022-02-17 MED ORDER — FAMOTIDINE 20 MG PO TABS
20.0000 mg | ORAL_TABLET | Freq: Once | ORAL | Status: AC
Start: 2022-02-17 — End: 2022-02-17

## 2022-02-17 MED ORDER — FENTANYL CITRATE (PF) 100 MCG/2ML IJ SOLN
INTRAMUSCULAR | Status: AC
  Administered 2022-02-17: 25 ug via INTRAVENOUS
  Filled 2022-02-17: qty 2

## 2022-02-17 MED ORDER — ATORVASTATIN CALCIUM 80 MG PO TABS
80.0000 mg | ORAL_TABLET | Freq: Every day | ORAL | Status: DC
Start: 2022-02-17 — End: 2022-02-18
  Administered 2022-02-17 – 2022-02-18 (×2): 80 mg via ORAL
  Filled 2022-02-17 (×2): qty 1

## 2022-02-17 MED ORDER — CEFAZOLIN SODIUM-DEXTROSE 2-4 GM/100ML-% IV SOLN
INTRAVENOUS | Status: AC
  Filled 2022-02-17: qty 100

## 2022-02-17 MED ORDER — ONDANSETRON 4 MG PO TBDP: 4.0000 mg | ORAL_TABLET | Freq: Four times a day (QID) | ORAL | Status: DC | PRN

## 2022-02-17 MED ORDER — SUGAMMADEX SODIUM 200 MG/2ML IV SOLN
INTRAVENOUS | Status: DC | PRN
  Administered 2022-02-17: 200 mg via INTRAVENOUS

## 2022-02-17 MED ORDER — FENTANYL CITRATE (PF) 100 MCG/2ML IJ SOLN
INTRAMUSCULAR | Status: AC
  Filled 2022-02-17: qty 2

## 2022-02-17 MED ORDER — METHYLENE BLUE 0.5 % INJ SOLN
INTRAVENOUS | Status: DC | PRN
  Administered 2022-02-17: 10 mL via INTRAMUSCULAR

## 2022-02-17 MED ORDER — EPHEDRINE SULFATE (PRESSORS) 50 MG/ML IJ SOLN
INTRAMUSCULAR | Status: DC | PRN
  Administered 2022-02-17 (×2): 10 mg via INTRAVENOUS
  Administered 2022-02-17: 5 mg via INTRAVENOUS

## 2022-02-17 MED ORDER — POLYETHYLENE GLYCOL 3350 17 G PO PACK: 17.0000 g | PACK | Freq: Every day | ORAL | Status: DC | PRN

## 2022-02-17 MED ORDER — TECHNETIUM TC 99M TILMANOCEPT KIT
1.1000 | PACK | Freq: Once | INTRAVENOUS | Status: AC | PRN
  Administered 2022-02-17: 1.1 via INTRADERMAL

## 2022-02-17 SURGICAL SUPPLY — 44 items
ADH SKN CLS APL DERMABOND .7 (GAUZE/BANDAGES/DRESSINGS) ×1
APL PRP STRL LF DISP 70% ISPRP (MISCELLANEOUS) ×1
BINDER BREAST LRG (GAUZE/BANDAGES/DRESSINGS) ×1 IMPLANT
BULB RESERV EVAC DRAIN JP 100C (MISCELLANEOUS) ×3 IMPLANT
CHLORAPREP W/TINT 26 (MISCELLANEOUS) ×2 IMPLANT
DERMABOND ADVANCED (GAUZE/BANDAGES/DRESSINGS) ×1
DERMABOND ADVANCED .7 DNX12 (GAUZE/BANDAGES/DRESSINGS) ×1 IMPLANT
DRAIN CHANNEL JP 15F RND 16 (MISCELLANEOUS) ×3 IMPLANT
DRAPE LAPAROTOMY TRNSV 106X77 (MISCELLANEOUS) ×2 IMPLANT
DRSG GAUZE FLUFF 36X18 (GAUZE/BANDAGES/DRESSINGS) ×2 IMPLANT
ELECT REM PT RETURN 9FT ADLT (ELECTROSURGICAL) ×2
ELECTRODE REM PT RTRN 9FT ADLT (ELECTROSURGICAL) ×1 IMPLANT
GAUZE 4X4 16PLY ~~LOC~~+RFID DBL (SPONGE) ×1 IMPLANT
GAUZE SPONGE 4X4 12PLY STRL (GAUZE/BANDAGES/DRESSINGS) ×1 IMPLANT
GLOVE SURG ENC MOIS LTX SZ6.5 (GLOVE) ×9 IMPLANT
GLOVE SURG UNDER POLY LF SZ6.5 (GLOVE) ×9 IMPLANT
GOWN STRL REUS W/ TWL LRG LVL3 (GOWN DISPOSABLE) ×2 IMPLANT
GOWN STRL REUS W/TWL LRG LVL3 (GOWN DISPOSABLE) ×8
KIT TURNOVER KIT A (KITS) ×2 IMPLANT
LABEL OR SOLS (LABEL) ×2 IMPLANT
MANIFOLD NEPTUNE II (INSTRUMENTS) ×2 IMPLANT
MARGIN MAP 10MM (MISCELLANEOUS) ×1 IMPLANT
PACK BASIN MAJOR ARMC (MISCELLANEOUS) ×2 IMPLANT
PAD ABD DERMACEA PRESS 5X9 (GAUZE/BANDAGES/DRESSINGS) ×1 IMPLANT
SLEVE PROBE SENORX GAMMA FIND (MISCELLANEOUS) ×2 IMPLANT
SPONGE T-LAP 18X18 ~~LOC~~+RFID (SPONGE) ×4 IMPLANT
SUT ETHILON 3-0 FS-10 30 BLK (SUTURE) ×2
SUT ETHILON 4-0 (SUTURE) ×2
SUT ETHILON 4-0 FS2 18XMFL BLK (SUTURE) ×1
SUT MNCRL 4-0 (SUTURE) ×2
SUT MNCRL 4-0 27XMFL (SUTURE) ×1
SUT SILK 2 0 SH (SUTURE) IMPLANT
SUT SILK 3 0 (SUTURE) ×2
SUT SILK 3-0 (SUTURE) ×2 IMPLANT
SUT SILK 3-0 18XBRD TIE 12 (SUTURE) IMPLANT
SUT VIC AB 3-0 54X BRD REEL (SUTURE) ×1 IMPLANT
SUT VIC AB 3-0 BRD 54 (SUTURE)
SUT VIC AB 3-0 SH 27 (SUTURE) ×2
SUT VIC AB 3-0 SH 27X BRD (SUTURE) ×2 IMPLANT
SUT VIC AB 3-0 SH 8-18 (SUTURE) ×2 IMPLANT
SUTURE EHLN 3-0 FS-10 30 BLK (SUTURE) ×1 IMPLANT
SUTURE ETHLN 4-0 FS2 18XMF BLK (SUTURE) IMPLANT
SUTURE MNCRL 4-0 27XMF (SUTURE) ×2 IMPLANT
WATER STERILE IRR 500ML POUR (IV SOLUTION) ×2 IMPLANT

## 2022-02-17 NOTE — Anesthesia Preprocedure Evaluation (Addendum)
Anesthesia Evaluation  ?Patient identified by MRN, date of birth, ID band ?Patient awake ? ? ? ?Reviewed: ?Allergy & Precautions, NPO status , Patient's Chart, lab work & pertinent test results ? ?History of Anesthesia Complications ?(+) PONV and history of anesthetic complications ? ?Airway ?Mallampati: II ? ? ?Neck ROM: Full ? ? ? Dental ? ?(+) Upper Dentures, Lower Dentures ?  ?Pulmonary ?former smoker (quit 2020),  ?  ?Pulmonary exam normal ?breath sounds clear to auscultation ? ? ? ? ? ? Cardiovascular ?hypertension, + CAD (s/p stent)  ?Normal cardiovascular exam ?Rhythm:Regular Rate:Normal ? ?ECG 06/06/21:  ?Normal sinus rhythm ?Low voltage QRS ?Nonspecific T wave abnormality ?  ?Neuro/Psych ?Seizures - (likely 2/2 CVA),  Chronic pain ?CVA (05/2019, residual left-sided numbness and weakness, on Plavix)   ? GI/Hepatic ?negative GI ROS,   ?Endo/Other  ?diabetes, Type 2 ? Renal/GU ?negative Renal ROS  ? ?  ?Musculoskeletal ? ? Abdominal ?  ?Peds ? Hematology ?Breast CA   ?Anesthesia Other Findings ? ? Reproductive/Obstetrics ? ?  ? ? ? ? ? ? ? ? ? ? ? ? ? ?  ?  ? ? ? ? ? ? ? ?Anesthesia Physical ?Anesthesia Plan ? ?ASA: 3 ? ?Anesthesia Plan: General  ? ?Post-op Pain Management:   ? ?Induction: Intravenous ? ?PONV Risk Score and Plan: 4 or greater and Ondansetron, Dexamethasone, Treatment may vary due to age or medical condition, Scopolamine patch - Pre-op and Aprepitant ? ?Airway Management Planned: Oral ETT ? ?Additional Equipment:  ? ?Intra-op Plan:  ? ?Post-operative Plan: Extubation in OR ? ?Informed Consent: I have reviewed the patients History and Physical, chart, labs and discussed the procedure including the risks, benefits and alternatives for the proposed anesthesia with the patient or authorized representative who has indicated his/her understanding and acceptance.  ? ? ? ?Dental advisory given ? ?Plan Discussed with: CRNA ? ?Anesthesia Plan Comments: (Patient consented  for risks of anesthesia including but not limited to:  ?- adverse reactions to medications ?- damage to eyes, teeth, lips or other oral mucosa ?- nerve damage due to positioning  ?- sore throat or hoarseness ?- damage to heart, brain, nerves, lungs, other parts of body or loss of life ? ?Informed patient about role of CRNA in peri- and intra-operative care.  Patient voiced understanding.)  ? ? ? ? ? ? ?Anesthesia Quick Evaluation ? ?

## 2022-02-17 NOTE — Telephone Encounter (Signed)
Surgery called to find out who was the home health agency for patient the last I se in chart is [facility] from 08/19/19. Any other signed order for homehealth? ?

## 2022-02-17 NOTE — Telephone Encounter (Signed)
Tammy Nunez from [facility] surgery, [phone]. Patent's left breast was removed. Tammy Nunez wants to know if [facility] care would be able to help patient with her drains. ?

## 2022-02-17 NOTE — Op Note (Signed)
Preoperative diagnosis: Invasive mammary Carcinoma of the left breast. ? ?Postoperative diagnosis: Same.  ? ?Procedure: Left total mastectomy. ?                    Left axillary Sentinel Lymph node biopsy ? ?Anesthesia: GETA ? ?Surgeon: Dr. Windell Moment ? ?Wound Classification: Clean ? ?Indications: Patient is a 71 y.o. female who had an abnormal mammogram that on workup with core needle biopsy was found to be invasive carcinoma in situ. After discussion of alternatives, the patient elected total (simple) mastectomy. ? ?Findings: ?Palpable multiple left breast masses.  ?3 blue nodes identified.  ? ?Description of procedure: The patient was brought to the operating room and general anesthesia was induced. A time-out was completed verifying correct patient, procedure, site, positioning, and implant(s) and/or special equipment prior to beginning this procedure. The breast, chest wall, axilla, and upper arm and neck were prepped and draped in the usual sterile fashion. I personally infected blue dye on the periareolar area of left breast for intra operative identification of axillary sentinel lymph nodes.   ?A skin incision was made that encompassed the nipple-areola complex and the previous biopsy scar and passed in an oblique direction across the breast. Flaps were raised in the avascular plane between subcutaneous tissue and breast tissue from the clavicle superiorly, the sternum medially, the anterior rectus sheath inferiorly, and past the lateral border of the pectoralis major muscle laterally. Hemostasis was achieved in the flaps. Next, the breast tissue and underlying pectoralis fascia were excised from the pectoralis major muscle, progressing from medially to laterally. At the lateral border of the pectoralis major muscle, the breast tissue was swung laterally and a lateral pedicle identified where breast tissue gave way to fat of axilla. The lateral pedicle was incised and the specimen removed.  ? ?A hand-held  gamma probe was used to identify the location of the hottest spot in the axilla. No significant signal was identified. The axilla was accessed through the same mastectomy incision.  Dissection continue until blue nodule was identified. Three blue nodes were excised.  No clinically abnormal nodes were palpated. The procedure was terminated. Hemostasis was achieved. ? ?The wound was irrigated and hemostasis was achieved. Closed suction drains were brought into the operative field through a separate stab incision and sutured to the skin with a 3-0 nylon suture. The wound was closed with interrupted 3-0 Vicryl to the subcutaneous layer, followed by a subcuticular layer of Monocryl 4-0. The wound was dressed.  ?The patient tolerated the procedure well and was taken to the postanesthesia care unit in stable condition.  ? ?  ? ?Sentinel Node Biopsy Synoptic Operative Report ? ?Operation performed with curative intent:Yes ? ?Tracer(s) used to identify sentinel nodes in the upfront surgery (non-neoadjuvant) setting (select all that apply):N/A ? ?Tracer(s) used to identify sentinel nodes in the neoadjuvant setting (select all that apply):Dye and Radioactive Tracer ? ?All nodes (colored or non-colored) present at the end of a dye-filled lymphatic channel were removed:Yes  ? ?All significantly radioactive nodes were removed:N/A. No radioactive nodes were identified.  ? ?All palpable suspicious nodes were removed:N/A ? ?Biopsy-proven positive nodes marked with clips prior to chemotherapy were identified and removed:N/A ? ?Specimen: Left Breast ?                   Sentinel lymph nodes #1, #2, #3.  ? ?Complications: None ? ?Estimated Blood Loss: 25 mL ? ?

## 2022-02-17 NOTE — Transfer of Care (Signed)
Immediate Anesthesia Transfer of Care Note ? ?Patient: Tammy Nunez ? ?Procedure(s) Performed: TOTAL MASTECTOMY (Left: Breast) ?SENTINEL NODE BIOPSY (Left: Breast) ? ?Patient Location: PACU ? ?Anesthesia Type:General ? ?Level of Consciousness: sedated ? ?Airway & Oxygen Therapy: Patient Spontanous Breathing and Patient connected to face mask oxygen ? ?Post-op Assessment: Report given to RN and Post -op Vital signs reviewed and stable ? ?Post vital signs: Reviewed ? ?Last Vitals:  ?Vitals Value Taken Time  ?BP    ?Temp    ?Pulse 63 02/17/22 1213  ?Resp 12 02/17/22 1213  ?SpO2 99 % 02/17/22 1213  ?Vitals shown include unvalidated device data. ? ?Last Pain:  ?Vitals:  ? 02/17/22 0846  ?TempSrc: Temporal  ?PainSc: 0-No pain  ?   ? ?  ? ?Complications: No notable events documented. ?

## 2022-02-17 NOTE — Anesthesia Postprocedure Evaluation (Signed)
Anesthesia Post Note ? ?Patient: Pilar Westergaard ? ?Procedure(s) Performed: TOTAL MASTECTOMY (Left: Breast) ?SENTINEL NODE BIOPSY (Left: Breast) ? ?Patient location during evaluation: PACU ?Anesthesia Type: General ?Level of consciousness: awake and alert, oriented and patient cooperative ?Pain management: pain level controlled ?Vital Signs Assessment: post-procedure vital signs reviewed and stable ?Respiratory status: spontaneous breathing, nonlabored ventilation and respiratory function stable ?Cardiovascular status: blood pressure returned to baseline and stable ?Postop Assessment: adequate PO intake ?Anesthetic complications: no ? ? ?No notable events documented. ? ? ?Last Vitals:  ?Vitals:  ? 02/17/22 1208 02/17/22 1215  ?BP: (!) 96/54 (!) 100/52  ?Pulse: 67 66  ?Resp: 15 15  ?Temp: (!) 36.1 ?C   ?SpO2: 99% 99%  ?  ?Last Pain:  ?Vitals:  ? 02/17/22 1208  ?TempSrc:   ?PainSc: Asleep  ? ? ?  ?  ?  ?  ?  ?  ? ?Darrin Nipper ? ? ? ? ?

## 2022-02-17 NOTE — Progress Notes (Signed)
Patient cleared to move to a ready room 205. Report given via phone. Arrived to floor and was told was not ready because the [facility]S had not finished its final steps after being trudy. EVS was called and they said they have not used trudy yet and the room was not ready per nurse. Room was marked as ready and looked cleaner than most rooms in the hospital.. Had to return patient back to [facility]. She is currently on hold. ?

## 2022-02-17 NOTE — Telephone Encounter (Signed)
[facility] is correct they can place a new home health referral and their social worker or scheduler can figure it out it does not have to be [facility] again  ? ?

## 2022-02-17 NOTE — Interval H&P Note (Signed)
History and Physical Interval Note: ? ?02/17/2022 ?9:29 AM ? ?Tammy Nunez  has presented today for surgery, with the diagnosis of C50.812 Malignant neoplasm of midline of lt breast female.  The various methods of treatment have been discussed with the patient and family. After consideration of risks, benefits and other options for treatment, the patient has consented to  Procedure(s): ?TOTAL MASTECTOMY (Left) and sentinel lymph node biopsy as a surgical intervention.  The patient's history has been reviewed, patient examined, no change in status, stable for surgery.  I have reviewed the patient's chart and labs.  Questions were answered to the patient's satisfaction.   ? ? ?[name] ? ? ?

## 2022-02-17 NOTE — Anesthesia Procedure Notes (Signed)
Procedure Name: Intubation ?Date/Time: 02/17/2022 10:16 AM ?Performed by: Rolla Plate, CRNA ?Pre-anesthesia Checklist: Patient identified, Patient being monitored, Timeout performed, Emergency Drugs available and Suction available ?Patient Re-evaluated:Patient Re-evaluated prior to induction ?Oxygen Delivery Method: Circle system utilized ?Preoxygenation: Pre-oxygenation with 100% oxygen ?Induction Type: IV induction ?Ventilation: Mask ventilation without difficulty ?Laryngoscope Size: Mac and 3 ?Grade View: Grade I ?Tube type: Oral ?Tube size: 7.0 mm ?Number of attempts: 1 ?Airway Equipment and Method: Stylet ?Placement Confirmation: ETT inserted through vocal cords under direct vision, positive ETCO2 and breath sounds checked- equal and bilateral ?Secured at: 21 cm ?Tube secured with: Tape ?Dental Injury: Teeth and Oropharynx as per pre-operative assessment  ? ? ? ? ?

## 2022-02-18 ENCOUNTER — Encounter: Payer: Self-pay | Admitting: General Surgery

## 2022-02-18 DIAGNOSIS — C50812 Malignant neoplasm of overlapping sites of left female breast: Secondary | ICD-10-CM | POA: Diagnosis not present

## 2022-02-18 MED ORDER — HYDROCODONE-ACETAMINOPHEN 5-325 MG PO TABS: 1.0000 | ORAL_TABLET | ORAL | 0 refills | Status: DC | PRN

## 2022-02-18 NOTE — Discharge Instructions (Signed)
?  Diet: Resume home heart healthy regular diet.  ? ?Activity: No heavy lifting >20 pounds (children, pets, laundry, garbage) or strenuous activity until follow-up, but light activity and walking are encouraged. Do not drive or drink alcohol if taking narcotic pain medications. ? ?Wound care: May shower with soapy water and pat dry (do not rub incisions), but no baths or submerging incision underwater until follow-up. (no swimming).  ? ?Measure drain output two times per day as instructed.  ? ?Medications: Resume all home medications. For mild to moderate pain: acetaminophen (Tylenol) or ibuprofen (if no kidney disease). Combining Tylenol with alcohol can substantially increase your risk of causing liver disease. Narcotic pain medications, if prescribed, can be used for severe pain, though may cause nausea, constipation, and drowsiness. Do not combine Tylenol and Norco within a 6 hour period as Norco contains Tylenol. If you do not need the narcotic pain medication, you do not need to fill the prescription. ? ?Call office [phone]) at any time if any questions, worsening pain, fevers/chills, bleeding, drainage from incision site, or other concerns. ? ?

## 2022-02-18 NOTE — Telephone Encounter (Signed)
Called and informed Judeen Hammans of the below.  ?

## 2022-02-18 NOTE — Discharge Summary (Signed)
?Patient ID: ?Tammy Nunez ?MRN: [id number] ?DOB/AGE: 01/21/1951 71 y.o. ? ?Admit date: 02/17/2022 ?Discharge date: 02/18/2022 ? ? ?Discharge Diagnoses:  ?Principal Problem: ?  Breast cancer ([place]) ? ? ?Procedures: Left total mastectomy with left axillary sentinel lymph node biopsy ? ?Hospital Course: Patient with left breast cancer she underwent left total mastectomy with sentinel node biopsy.  Patient has been recovering adequately.  This morning patient with pain control.  The patient endorses that she feels comfortable emptying the drain and measuring the output.  I actually explained the patient and the husband about how to take care of the wound and they feel comfortable as well.  They are not interested in home health. ? ?Physical Exam ?Constitutional:   ?   Appearance: Normal appearance.  ?HENT:  ?   Head: Normocephalic.  ?Cardiovascular:  ?   Rate and Rhythm: Normal rate and regular rhythm.  ?Pulmonary:  ?   Effort: Pulmonary effort is normal.  ?   Breath sounds: Normal breath sounds.  ?Chest:  ?Breasts: ?   Left: Absent.  ?   Comments: No ischemic tissue, no bruises, no fluid collection.  Drain in place. ?Abdominal:  ?   General: Abdomen is flat.  ?Musculoskeletal:     ?   General: Normal range of motion.  ?   Cervical back: Normal range of motion.  ?Skin: ?   General: Skin is warm.  ?   Capillary Refill: Capillary refill takes less than 2 seconds.  ?Neurological:  ?   General: No focal deficit present.  ?   Mental Status: She is alert.  ? ? ? ?Consults: None ? ?Disposition: Discharge disposition: 01-Home or Self Care ? ? ? ? ? ? ?Discharge Instructions   ? ? Diet - low sodium heart healthy   Complete by: As directed ?  ? Increase activity slowly   Complete by: As directed ?  ? ?  ? ?Allergies as of 02/18/2022   ?No Known Allergies ?  ? ?  ?Medication List  ?  ? ?TAKE these medications   ? ?amLODipine 10 MG tablet ?Commonly known as: NORVASC ?Take 1 tablet (10 mg total) by mouth daily. ?  ?aspirin 81 MG EC  tablet ?Take 1 tablet (81 mg total) by mouth daily. ?  ?atorvastatin 80 MG tablet ?Commonly known as: LIPITOR ?Take 1 tablet (80 mg total) by mouth daily. ?  ?blood glucose meter kit and supplies ?1 each by Other route 4 (four) times daily. Dispense based on patient and insurance preference. Four times daily as directed. (FOR ICD-10 E10.9, E11.9). ?  ?carvedilol 25 MG tablet ?Commonly known as: COREG ?Take 1 tablet (25 mg total) by mouth 2 (two) times daily with a meal. ?  ?clopidogrel 75 MG tablet ?Commonly known as: PLAVIX ?Take 1 tablet (75 mg total) by mouth daily. Further refills [facility] neurology ?  ?dexamethasone 2 MG tablet ?Commonly known as: DECADRON ?Take 1 tablet (2 mg total) by mouth See admin instructions. Take 63m daily for 2 days after each chemotherapy. ?  ?empagliflozin 25 MG Tabs tablet ?Commonly known as: Jardiance ?Take 1 tablet (25 mg total) by mouth daily. ?  ?FreeStyle Libre 2 Sensor Misc ?USE TO CHECK BLOOD SUGAR AT LEAST EVERY 8 HOURS ?  ?HYDROcodone-acetaminophen 5-325 MG tablet ?Commonly known as: NORCO/VICODIN ?Take 1 tablet by mouth every 4 (four) hours as needed for moderate pain. ?  ?lidocaine-prilocaine cream ?Commonly known as: EMLA ?Apply to affected area once ?  ?lisinopril 40 MG tablet ?Commonly  known as: ZESTRIL ?Take 1 tablet (40 mg total) by mouth daily. ?  ?loperamide 2 MG capsule ?Commonly known as: IMODIUM ?Take 1 capsule (2 mg total) by mouth See admin instructions. Take 85m at the onset of diarrhea, and then 244mafter each loose bowel movement, maximum 1667mer 24 hours. ?  ?loratadine 10 MG tablet ?Commonly known as: CLARITIN ?Take 1 tablet (10 mg total) by mouth daily. ?  ?multivitamin tablet ?Take 1 tablet by mouth daily. ?  ?ondansetron 8 MG tablet ?Commonly known as: Zofran ?Take 1 tablet (8 mg total) by mouth 2 (two) times daily as needed. Start on the third day after chemotherapy. ?What changed: reasons to take this ?  ?potassium chloride SA 20 MEQ tablet ?Commonly  known as: Klor-Con M20 ?Take 1 tablet (20 mEq total) by mouth daily. ?  ?prochlorperazine 10 MG tablet ?Commonly known as: COMPAZINE ?Take 1 tablet (10 mg total) by mouth every 6 (six) hours as needed (Nausea or vomiting). ?  ?Slow-Mag 64 MG Tbec SR tablet ?Generic drug: magnesium chloride ?Take 1 tablet (64 mg total) by mouth daily. ?  ?vitamin B-12 1000 MCG tablet ?Commonly known as: CYANOCOBALAMIN ?Take 1 tablet (1,000 mcg total) by mouth daily. ?  ?Vitamin D (Cholecalciferol) 25 MCG (1000 UT) Tabs ?Take 1,000 Units by mouth. ?  ? ?  ? ? Follow-up Information   ? ? [facility] Follow up in 1 week(s).   ?Specialty: General Surgery ?Contact information: ?[address] [phone]? ?  ?  ? ?  ?  ? ?  ? ? ?

## 2022-02-20 ENCOUNTER — Other Ambulatory Visit: Payer: Self-pay | Admitting: Anatomic Pathology & Clinical Pathology

## 2022-02-20 LAB — SURGICAL PATHOLOGY

## 2022-02-21 DIAGNOSIS — I10 Essential (primary) hypertension: Secondary | ICD-10-CM

## 2022-02-21 DIAGNOSIS — E1165 Type 2 diabetes mellitus with hyperglycemia: Secondary | ICD-10-CM

## 2022-02-25 ENCOUNTER — Telehealth: Payer: Self-pay | Admitting: Internal Medicine

## 2022-02-25 NOTE — Telephone Encounter (Signed)
Courtney from [place] called and said that the patient was advised to take her potassium chloride SA (KLOR-CON M20) 20 MEQ tablet 2x a day. Prescription needs to be updated.  ?

## 2022-02-25 NOTE — Telephone Encounter (Signed)
No its just 1 20 meq  ?Please inform pt and pharmacy  ? ?Thank you ? ?

## 2022-02-25 NOTE — Telephone Encounter (Signed)
Pt and pt pharmacy advised 1 tab QD ?

## 2022-02-26 ENCOUNTER — Other Ambulatory Visit: Payer: Self-pay | Admitting: Internal Medicine

## 2022-02-26 DIAGNOSIS — E876 Hypokalemia: Secondary | ICD-10-CM

## 2022-02-26 MED ORDER — POTASSIUM CHLORIDE CRYS ER 20 MEQ PO TBCR: 20.0000 meq | EXTENDED_RELEASE_TABLET | Freq: Every day | ORAL | 3 refills | Status: DC

## 2022-03-03 ENCOUNTER — Inpatient Hospital Stay: Payer: PPO | Attending: Oncology | Admitting: Oncology

## 2022-03-03 ENCOUNTER — Encounter: Payer: Self-pay | Admitting: Oncology

## 2022-03-03 VITALS — BP 125/68 | HR 85 | Temp 98.7°F | Resp 16 | Ht 61.0 in | Wt 134.8 lb

## 2022-03-03 DIAGNOSIS — Z5111 Encounter for antineoplastic chemotherapy: Secondary | ICD-10-CM | POA: Insufficient documentation

## 2022-03-03 DIAGNOSIS — C50919 Malignant neoplasm of unspecified site of unspecified female breast: Secondary | ICD-10-CM

## 2022-03-03 DIAGNOSIS — Z8673 Personal history of transient ischemic attack (TIA), and cerebral infarction without residual deficits: Secondary | ICD-10-CM | POA: Diagnosis not present

## 2022-03-03 DIAGNOSIS — I10 Essential (primary) hypertension: Secondary | ICD-10-CM | POA: Insufficient documentation

## 2022-03-03 DIAGNOSIS — Z170 Estrogen receptor positive status [ER+]: Secondary | ICD-10-CM | POA: Diagnosis not present

## 2022-03-03 DIAGNOSIS — Z7982 Long term (current) use of aspirin: Secondary | ICD-10-CM | POA: Insufficient documentation

## 2022-03-03 DIAGNOSIS — Z803 Family history of malignant neoplasm of breast: Secondary | ICD-10-CM | POA: Diagnosis not present

## 2022-03-03 DIAGNOSIS — Z9221 Personal history of antineoplastic chemotherapy: Secondary | ICD-10-CM | POA: Insufficient documentation

## 2022-03-03 DIAGNOSIS — Z7984 Long term (current) use of oral hypoglycemic drugs: Secondary | ICD-10-CM | POA: Insufficient documentation

## 2022-03-03 DIAGNOSIS — Z95828 Presence of other vascular implants and grafts: Secondary | ICD-10-CM | POA: Diagnosis not present

## 2022-03-03 DIAGNOSIS — C50912 Malignant neoplasm of unspecified site of left female breast: Secondary | ICD-10-CM | POA: Diagnosis not present

## 2022-03-03 DIAGNOSIS — Z79899 Other long term (current) drug therapy: Secondary | ICD-10-CM | POA: Insufficient documentation

## 2022-03-03 DIAGNOSIS — I2510 Atherosclerotic heart disease of native coronary artery without angina pectoris: Secondary | ICD-10-CM | POA: Insufficient documentation

## 2022-03-03 DIAGNOSIS — E119 Type 2 diabetes mellitus without complications: Secondary | ICD-10-CM | POA: Insufficient documentation

## 2022-03-03 NOTE — Progress Notes (Signed)
Hematology/Oncology Progress note [phone]    Patient Care Team: McLean-Scocuzza, Pasty Spillers, MD as PCP - General (Internal Medicine) Scarlett Presto, RN as Oncology Nurse Navigator Maple Mirza, RN as [facility] Care Management  REFERRING PROVIDER: McLean-Scocuzza, French Ana *  CHIEF COMPLAINTS/REASON FOR VISIT:  multifocal left breast cancer  HISTORY OF PRESENTING ILLNESS:   Tammy Nunez is a  71 y.o.  female with PMH listed below was seen in consultation at the request of  McLean-Scocuzza, French Ana *  for evaluation of multifocal left breast cancer.   04/24/2021, screening mammogram showed large asymmetry in the superior aspect of the breast with associated calcifications and possible masses and a subtle distortions.  No suspicious findings for malignancy right breast.  05/02/2021 left breast diagnostic mammogram and ultrasound showed multiple irregular mass in the left breast. Mammogram mass 1 irregular mass associated with distortion in the upper breast at middle depth, multiple adjacent and possible continuous irregular masses, conglomeration of masses measures at least 5.6 cm.  Associated pleomorphic calcifications extending at least 5 x 3.9 x 3.7 cm Mass 2, 1 cm mass in the upper breast at the middle to posterior depth. Mass 3, 5 mm irregular mass in the upper breast at posterior depth. In total, mass 1-mass 3 span at least 8.2 cm in anterior-posterior extent.  By ultrasound  mass 1 12:00 5 cm from nipple, 2.7 x 1.5 x 4.3 cm Mass 2, 12:00 12 cm from nipple, 0.9 x 0.9 x 0.5 cm Mass 3   12:00 14 cm from nipple, 0.4 x 0.4 x 0.4 cm-uses 2.4 cm superior to mass to Mass 4   11:00  No suspicious left axillary adenopathy.   05/07/2021 -Ultrasound-guided breast biopsy Breast left 12:00 mass 5 cm from nipple biopsy showed invasive mammary carcinoma, no special type, grade 3, high-grade DCIS present, no LVI, ER 90%, PR 11-50%, HER2 negative by IHC Breast  left 11:00 mass 10 cm from nipple biopsy showed invasive mammary carcinoma, no special type, grade 2, no DCIS/LVI identified.ER 90%, PR 11-50%, HER2 negative by IHC  Patient is married has no children.  Denies any hormone replacement, oral contraceptive use, family history of breast cancer.  Denies any chest radiation. Patient has a history of stroke with chronic residual weakness of the left side.  She walks with a cane.  05/22/2021 MRI breast showed that nonmass like malignancy spanning over 8 cm, abuting anterior aspect of pectoralis muscle. Discussed with Dr.Cintron and we agree that she will need mastectomy. There is concern of possible positive margin. I recommend neoadjuvant chemotherapy  # medi port placed on 06/06/2021. 06/13/2021 pre chemotherapy Echo. LVEF 60-65%.  Normal global longitudinal strain. Grade 1 diastolic dysfunction.   # 05/22/2021 MRI breast showed  Biopsy-proven malignancy within the UPPER LEFT breast spanning a distance of 8.3 x 4.2 x 4 cm nonmass like enhancement abuts the anterior aspect of the LEFT pectoralis muscle without gross invasion. Oncotype Dx 27. Discussed with Dr.Cintron, there is concern of positive margin.   #Chemotherapy 06/21/2021 - 08/16/2021 ddAC x4 with G-CSF support. 08/30/2021 - 09/06/2021, weekly Taxol. Chemotherapy break due to side effects, UTI and the patient's preference. 10/25/2021  INTERVAL HISTORY Tammy Nunez is a 71 y.o. female who has above history reviewed by me today presents for follow up visit for  breast cancer Patient was accompanied by her husband. 02/17/2022, patient underwent left simple mastectomy, and a sentinel lymph node biopsy.  Pathology showed invasive mammary carcinoma, high-grade DCIS, 4 lymph nodes  were sampled and were negative for cancer. ypT2 pN0, grade 3, multifocal invasive carcinoma.  All margins are negative for invasive carcinoma and DCIS.  Patient presents for postoperation follow-up and discuss about future  plan. Overall she reports feeling well.  No concern of her surgical site.  Drain has been removed today.  Review of Systems  Constitutional:  Positive for fatigue. Negative for appetite change, chills and fever.  HENT:   Negative for hearing loss and voice change.   Eyes:  Negative for eye problems.  Respiratory:  Negative for chest tightness and cough.   Cardiovascular:  Negative for chest pain.  Gastrointestinal:  Negative for abdominal distention, abdominal pain, blood in stool, diarrhea and nausea.  Endocrine: Negative for hot flashes.  Genitourinary:  Negative for difficulty urinating, dysuria and frequency.   Musculoskeletal:  Negative for arthralgias.  Skin:  Negative for itching and rash.  Neurological:  Negative for extremity weakness.  Hematological:  Negative for adenopathy. Does not bruise/bleed easily.  Psychiatric/Behavioral:  Negative for confusion.    MEDICAL HISTORY:  Past Medical History:  Diagnosis Date   Anemia    Breast cancer (HCC) 02/17/2022   Breast cancer in female [facility]) 05/08/2021   Chronic left shoulder pain    Coronary artery disease    x 1 stent   Diabetes mellitus without complication (HCC)    Dysrhythmia    Hypertension    Hypomagnesemia 09/13/2021   Paralysis (HCC)    weakness left upper amd lower extremity   PONV (postoperative nausea and vomiting)    Stroke [facility])     SURGICAL HISTORY: Past Surgical History:  Procedure Laterality Date   BREAST BIOPSY Left 05/07/2021   12:00 5cmfn vision clip path pending   BREAST BIOPSY Left 05/07/2021   11:00 10 cmfn mini cork clip path pending   CAROTID PTA/STENT INTERVENTION Left 07/20/2019   Procedure: CAROTID PTA/STENT INTERVENTION;  Surgeon: Renford Dills, MD;  Location: [facility] INVASIVE CV LAB;  Service: Cardiovascular;  Laterality: Left;   ECTOPIC PREGNANCY SURGERY     PORTACATH PLACEMENT N/A 06/06/2021   Procedure: INSERTION PORT-A-CATH;  Surgeon: Carolan Shiver, MD;  Location: [facility] ORS;   Service: General;  Laterality: N/A;   SENTINEL NODE BIOPSY Left 02/17/2022   Procedure: SENTINEL NODE BIOPSY;  Surgeon: Carolan Shiver, MD;  Location: [facility] ORS;  Service: General;  Laterality: Left;   TOTAL MASTECTOMY Left 02/17/2022   Procedure: TOTAL MASTECTOMY;  Surgeon: Carolan Shiver, MD;  Location: [facility] ORS;  Service: General;  Laterality: Left;    SOCIAL HISTORY: Social History   Socioeconomic History   Marital status: Married    Spouse name: Ivar Drape   Number of children: Not on file   Years of education: Not on file   Highest education level: Not on file  Occupational History   Not on file  Tobacco Use   Smoking status: Former    Packs/day: 0.50    Years: 15.00    Pack years: 7.50    Types: Cigarettes    Quit date: 04/11/2019    Years since quitting: 2.8   Smokeless tobacco: Never  Vaping Use   Vaping Use: Never used  Substance and Sexual Activity   Alcohol use: Never   Drug use: Never   Sexual activity: Not Currently  Other Topics Concern   Not on file  Social History Narrative   No kids    Married with husband    Used to work [employer], dispensing    Social Determinants  of Health   Financial Resource Strain: Low Risk    Difficulty of Paying Living Expenses: Not hard at all  Food Insecurity: No Food Insecurity   Worried About Running Out of Food in the Last Year: Never true   Ran Out of Food in the Last Year: Never true  Transportation Needs: No Transportation Needs   Lack of Transportation (Medical): No   Lack of Transportation (Non-Medical): No  Physical Activity: Not on file  Stress: Not on file  Social Connections: Not on file  Intimate Partner Violence: Not At Risk   Fear of Current or Ex-Partner: No   Emotionally Abused: No   Physically Abused: No   Sexually Abused: No    FAMILY HISTORY: Family History  Problem Relation Age of Onset   Diabetes type II Mother    Hypertension Father    Heart attack Father     Diabetes type II Brother    Breast cancer Neg Hx     ALLERGIES:  has No Known Allergies.  MEDICATIONS:  Current Outpatient Medications  Medication Sig Dispense Refill   amLODipine (NORVASC) 10 MG tablet Take 1 tablet (10 mg total) by mouth daily. 90 tablet 3   aspirin 81 MG EC tablet Take 1 tablet (81 mg total) by mouth daily. 90 tablet 3   atorvastatin (LIPITOR) 80 MG tablet Take 1 tablet (80 mg total) by mouth daily. 90 tablet 3   blood glucose meter kit and supplies 1 each by Other route 4 (four) times daily. Dispense based on patient and insurance preference. Four times daily as directed. (FOR ICD-10 E10.9, E11.9). 1 each 0   carvedilol (COREG) 25 MG tablet Take 1 tablet (25 mg total) by mouth 2 (two) times daily with a meal. 180 tablet 3   clopidogrel (PLAVIX) 75 MG tablet Take 1 tablet (75 mg total) by mouth daily. Further refills [facility] neurology 90 tablet 3   Continuous Blood Gluc Sensor (FREESTYLE LIBRE 2 SENSOR) MISC USE TO CHECK BLOOD SUGAR AT LEAST EVERY 8 HOURS 6 each 0   dexamethasone (DECADRON) 2 MG tablet Take 1 tablet (2 mg total) by mouth See admin instructions. Take 2mg  daily for 2 days after each chemotherapy. 10 tablet 0   empagliflozin (JARDIANCE) 25 MG TABS tablet Take 1 tablet (25 mg total) by mouth daily. 90 tablet 3   HYDROcodone-acetaminophen (NORCO/VICODIN) 5-325 MG tablet Take 1 tablet by mouth every 4 (four) hours as needed for moderate pain. 16 tablet 0   lidocaine-prilocaine (EMLA) cream Apply to affected area once 30 g 3   lisinopril (ZESTRIL) 40 MG tablet Take 1 tablet (40 mg total) by mouth daily. 90 tablet 3   loperamide (IMODIUM) 2 MG capsule Take 1 capsule (2 mg total) by mouth See admin instructions. Take 4mg  at the onset of diarrhea, and then 2mg  after each loose bowel movement, maximum 16mg  per 24 hours. 30 capsule 1   loratadine (CLARITIN) 10 MG tablet Take 1 tablet (10 mg total) by mouth daily. 90 tablet 3   magnesium chloride (SLOW-MAG) 64 MG TBEC SR  tablet Take 1 tablet (64 mg total) by mouth daily. 30 tablet 1   Multiple Vitamin (MULTIVITAMIN) tablet Take 1 tablet by mouth daily.     ondansetron (ZOFRAN) 8 MG tablet Take 1 tablet (8 mg total) by mouth 2 (two) times daily as needed. Start on the third day after chemotherapy. (Patient taking differently: Take 8 mg by mouth 2 (two) times daily as needed for nausea or vomiting.  Start on the third day after chemotherapy.) 30 tablet 1   potassium chloride SA (KLOR-CON M20) 20 MEQ tablet Take 1 tablet (20 mEq total) by mouth daily. Clarify its 1x per day 90 tablet 3   prochlorperazine (COMPAZINE) 10 MG tablet Take 1 tablet (10 mg total) by mouth every 6 (six) hours as needed (Nausea or vomiting). 30 tablet 1   vitamin B-12 (CYANOCOBALAMIN) 1000 MCG tablet Take 1 tablet (1,000 mcg total) by mouth daily. 30 tablet 1   Vitamin D, Cholecalciferol, 25 MCG (1000 UT) TABS Take 1,000 Units by mouth.     No current facility-administered medications for this visit.   Facility-Administered Medications Ordered in Other Visits  Medication Dose Route Frequency Provider Last Rate Last Admin   heparin lock flush 100 UNIT/ML injection              PHYSICAL EXAMINATION: ECOG PERFORMANCE STATUS: 2 - Symptomatic, <50% confined to bed Vitals:   03/03/22 1414  BP: 125/68  Pulse: 85  Resp: 16  Temp: 98.7 F (37.1 C)  SpO2: 100%    Filed Weights   03/03/22 1414  Weight: 134 lb 12.8 oz (61.1 kg)      Physical Exam Constitutional:      General: She is not in acute distress. HENT:     Head: Normocephalic and atraumatic.  Eyes:     General: No scleral icterus. Cardiovascular:     Rate and Rhythm: Normal rate and regular rhythm.     Heart sounds: Normal heart sounds.  Pulmonary:     Effort: Pulmonary effort is normal. No respiratory distress.     Breath sounds: No wheezing.  Abdominal:     General: Bowel sounds are normal. There is no distension.     Palpations: Abdomen is soft.   Musculoskeletal:        General: No deformity. Normal range of motion.     Cervical back: Normal range of motion and neck supple.  Skin:    General: Skin is warm and dry.     Findings: No erythema or rash.  Neurological:     Mental Status: She is alert and oriented to person, place, and time. Mental status is at baseline.     Comments: Chronic left upper and lower extremity weakness.  Psychiatric:        Mood and Affect: Mood normal.  Left breast mass palpable, similar size.    LABORATORY DATA:  I have reviewed the data as listed Lab Results  Component Value Date   WBC 3.3 (L) 01/21/2022   HGB 11.1 (L) 01/21/2022   HCT 34.1 (L) 01/21/2022   MCV 93.4 01/21/2022   PLT 173 01/21/2022   Recent Labs    01/03/22 0758 01/14/22 0759 01/21/22 0817  NA 135 137 136  K 3.7 4.0 3.8  CL 107 110 105  CO2 21* 20* 24  GLUCOSE 166* 166* 108*  BUN 11 13 14   CREATININE 0.71 0.81 0.72  CALCIUM 9.2 9.6 9.3  GFRNONAA >60 >60 >60  PROT 6.5 6.5 6.3*  ALBUMIN 3.6 3.9 3.9  AST 19 17 23   ALT 19 17 30   ALKPHOS 45 39 37*  BILITOT 0.2* 0.5 0.2*    Iron/TIBC/Ferritin/ %Sat    Component Value Date/Time   IRON 113 12/08/2019 1015   TIBC 317 12/08/2019 1015   FERRITIN 67 12/08/2019 1015   IRONPCTSAT 36 12/08/2019 1015       RADIOGRAPHIC STUDIES: I have personally reviewed the radiological images as listed  and agreed with the findings in the report. MR BREAST BILATERAL W WO CONTRAST INC CAD  Result Date: 02/06/2022 CLINICAL DATA:  Patient with multiple masses in the left breast 12 o'clock position 5 cm from nipple. EXAM: BILATERAL BREAST MRI WITH AND WITHOUT CONTRAST TECHNIQUE: Multiplanar, multisequence MR images of both breasts were obtained prior to and following the intravenous administration of 6 ml of Gadavist Three-dimensional MR images were rendered by post-processing of the original MR data on an independent workstation. The three-dimensional MR images were interpreted, and  findings are reported in the following complete MRI report for this study. Three dimensional images were evaluated at the independent interpreting workstation using the DynaCAD thin client. COMPARISON:  Previous exam(s). FINDINGS: Breast composition: c. Heterogeneous fibroglandular tissue. Background parenchymal enhancement: Minimal Right breast: No mass or abnormal enhancement. Left breast: Redemonstrated multiple masses and adjacent non mass enhancement within the upper left breast measuring approximally 8.2 x 2.8 cm. The intervening non mass enhancement appears similar to minimally improved when compared to prior exam. The two dominant masses within the inferior aspect are grossly similar in size measuring 0.9 x 0.8 cm (image 69; series 7), previously 0.9 x 1.0 cm. The more posteroinferior mass measures 10 x 7 mm (image 70; series 7), previously 11 x 7 mm. Lymph nodes: No abnormal appearing lymph nodes. Ancillary findings:  Right anterior chest wall Port-A-Cath. IMPRESSION: Overall the findings within the left breast are similar when compared to prior MRI exams. The intervening non mass enhancement within the upper left breast may be slightly improved. The dominant masses located within the central aspect of the left breast are grossly unchanged when compared to prior exam. RECOMMENDATION: Treatment plan for known left breast malignancy. BI-RADS CATEGORY  6: Known biopsy-proven malignancy. Electronically Signed   By: Annia Belt M.D.   On: 02/06/2022 14:48  NM Sentinel Node Inj-No Rpt (Breast)  Result Date: 02/17/2022 Sulfur Colloid was injected by the Nuclear Medicine Technologist for sentinel lymph node localization.   VAS US CAROTID  Result Date: 12/18/2021 Carotid Arterial Duplex Study Patient Name:  BETZAIDA AHLSTRAND  Date of Exam:   12/12/2021 Medical Rec #: [id number]        Accession #:    [id number] Date of Birth: 1951-01-07        Patient Gender: F Patient Age:   31 years Exam Location:  [facility] Procedure:      VAS US CAROTID Referring Phys: Festus Barren --------------------------------------------------------------------------------  Indications:       Carotid artery disease and Right stent. Comparison Study:  10/2020 Performing Technologist: Salvadore Farber RVT  Examination Guidelines: A complete evaluation includes B-mode imaging, spectral Doppler, color Doppler, and power Doppler as needed of all accessible portions of each vessel. Bilateral testing is considered an integral part of a complete examination. Limited examinations for reoccurring indications may be performed as noted.  Right Carotid Findings: +----------+--------+--------+--------+------------------+--------+           PSV cm/sEDV cm/sStenosisPlaque DescriptionComments +----------+--------+--------+--------+------------------+--------+ CCA Prox  62      14                                         +----------+--------+--------+--------+------------------+--------+ CCA Mid   75      16                                         +----------+--------+--------+--------+------------------+--------+  CCA Distal74      20                                stent    +----------+--------+--------+--------+------------------+--------+ ICA Prox  70      17      1-39%                     stent    +----------+--------+--------+--------+------------------+--------+ ICA Mid   80      19                                         +----------+--------+--------+--------+------------------+--------+ ICA Distal84      22                                         +----------+--------+--------+--------+------------------+--------+ ECA       74      16                                         +----------+--------+--------+--------+------------------+--------+ +----------+--------+-------+----------------+-------------------+           PSV cm/sEDV cmsDescribe        Arm Pressure (mmHG)  +----------+--------+-------+----------------+-------------------+ [id number]            Multiphasic, WNL                    +----------+--------+-------+----------------+-------------------+ +---------+--------+--+--------+--+---------+ VertebralPSV cm/s94EDV cm/s24Antegrade +---------+--------+--+--------+--+---------+     Left Carotid Findings: +----------+--------+--------+--------+------------------+--------+           PSV cm/sEDV cm/sStenosisPlaque DescriptionComments +----------+--------+--------+--------+------------------+--------+ CCA Prox  75      13                                         +----------+--------+--------+--------+------------------+--------+ CCA Mid   87      16                                         +----------+--------+--------+--------+------------------+--------+ CCA Distal79      16                                         +----------+--------+--------+--------+------------------+--------+ ICA Prox  66      25                                         +----------+--------+--------+--------+------------------+--------+ ICA Mid   65      26                                         +----------+--------+--------+--------+------------------+--------+ ICA Distal79      24                                         +----------+--------+--------+--------+------------------+--------+  ECA       139     17                                         +----------+--------+--------+--------+------------------+--------+ +----------+--------+--------+----------------+-------------------+           PSV cm/sEDV cm/sDescribe        Arm Pressure (mmHG) +----------+--------+--------+----------------+-------------------+ [id number]             Multiphasic, WNL                    +----------+--------+--------+----------------+-------------------+ +---------+--------+--+--------+--+---------+ VertebralPSV cm/s38EDV  cm/s10Antegrade +---------+--------+--+--------+--+---------+      Summary: Right Carotid: Velocities in the right ICA are consistent with a 1-39% stenosis.                Non-hemodynamically significant plaque <50% noted in the CCA.                Patent ICA stent. Left Carotid: Velocities in the left ICA are consistent with a 1-39% stenosis.               Non-hemodynamically significant plaque <50% noted in the CCA. Mild               calcified plaque throughout. Vertebrals:  Bilateral vertebral arteries demonstrate antegrade flow. Subclavians: Normal flow hemodynamics were seen in bilateral subclavian              arteries. *See table(s) above for measurements and observations.  Electronically signed by Festus Barren MD on 12/18/2021 at 10:49:39 AM.    Final        ASSESSMENT & PLAN:  1. Invasive carcinoma of breast (HCC)    Cancer Staging  Invasive carcinoma of breast (HCC) Staging form: Breast, AJCC 8th Edition - Clinical stage from 05/16/2021: Stage IIB (cT3, cN0, cM0, G3, ER+, PR+, HER2-) - Signed by Rickard Patience, MD on 06/03/2021  #Left breast invasive carcinoma, multiple sites.  ER 90%, PR 11-50% positive, HER2 negative by IHC cT3 N0, status post neoadjuvant chemotherapy with 4 cycles of ddAC followed by weekly Taxol. ypT2 pN0 Clinically she is doing very well Case was discussed on tumor board on 03/03/2022.  Due to the initial mass potentially abutting the pectoralis muscle, Dr. Valentino Hue recommends evaluation for radiation.  Will refer patient to establish care.  Discussed with patient about adjuvant endocrine therapy plan. Ki-67 was 20%.  Potential use of abemaciclib adjuvantly. I will see patient couple of weeks after she finishes radiation for evaluation of adjuvant plan.  Patient agrees with the plan.  Port-A-Cath in place, recommend port flush every 8 weeks x 6.  All questions were answered. The patient knows to call the clinic with any problems questions or  concerns.  cc McLean-Scocuzza, French Ana *  Follow-up to be determined, plan to see her 3 weeks after last day of radiation.  Rickard Patience, MD, PhD 03/03/2022

## 2022-03-04 ENCOUNTER — Encounter: Payer: Self-pay | Admitting: Oncology

## 2022-03-04 ENCOUNTER — Telehealth: Payer: Self-pay

## 2022-03-04 NOTE — Telephone Encounter (Signed)
-----   Message from [name], MD sent at 03/04/2022  1:08 PM EDT ----- ?Please schedule her to have port flush Q8w x 6 ?We discussed about this during visit.  ? ?

## 2022-03-05 ENCOUNTER — Encounter: Payer: Self-pay | Admitting: Internal Medicine

## 2022-03-05 ENCOUNTER — Ambulatory Visit (INDEPENDENT_AMBULATORY_CARE_PROVIDER_SITE_OTHER): Payer: PPO | Admitting: Internal Medicine

## 2022-03-05 VITALS — BP 124/58 | HR 72 | Temp 98.2°F | Resp 14 | Ht 61.0 in | Wt 133.4 lb

## 2022-03-05 DIAGNOSIS — I10 Essential (primary) hypertension: Secondary | ICD-10-CM | POA: Diagnosis not present

## 2022-03-05 DIAGNOSIS — R319 Hematuria, unspecified: Secondary | ICD-10-CM

## 2022-03-05 DIAGNOSIS — Z9012 Acquired absence of left breast and nipple: Secondary | ICD-10-CM

## 2022-03-05 DIAGNOSIS — E1159 Type 2 diabetes mellitus with other circulatory complications: Secondary | ICD-10-CM | POA: Diagnosis not present

## 2022-03-05 DIAGNOSIS — I152 Hypertension secondary to endocrine disorders: Secondary | ICD-10-CM

## 2022-03-05 LAB — POCT GLYCOSYLATED HEMOGLOBIN (HGB A1C): Hemoglobin A1C: 5.9 % — AB (ref 4.0–5.6)

## 2022-03-05 MED ORDER — AMLODIPINE BESYLATE 5 MG PO TABS: 5.0000 mg | ORAL_TABLET | Freq: Every day | ORAL | 3 refills | Status: DC

## 2022-03-05 NOTE — Patient Instructions (Addendum)
Cut norvasc 5 mg from 10 daily blood pressure medication  ? ?Hypokalemia ?Hypokalemia means that the amount of potassium in the blood is lower than normal. Potassium is a chemical (electrolyte) that helps regulate the amount of fluid in the body. It also stimulates muscle tightening (contraction) and helps nerves work properly. ?Normally, most of the body's potassium is inside cells, and only a very small amount is in the blood. Because the amount in the blood is so small, minor changes to potassium levels in the blood can be life-threatening. ?What are the causes? ?This condition may be caused by: ?Antibiotic medicine. ?Diarrhea or vomiting. Taking too much of a medicine that helps you have a bowel movement (laxative) can cause diarrhea and lead to hypokalemia. ?Chronic kidney disease (CKD). ?Medicines that help the body get rid of excess fluid (diuretics). ?Eating disorders, such as bulimia. ?Low magnesium levels in the body. ?Sweating a lot. ?What are the signs or symptoms? ?Symptoms of this condition include: ?Weakness. ?Constipation. ?Fatigue. ?Muscle cramps. ?Mental confusion. ?Skipped heartbeats or irregular heartbeat (palpitations). ?Tingling or numbness. ?How is this diagnosed? ?This condition is diagnosed with a blood test. ?How is this treated? ?This condition may be treated by: ?Taking potassium supplements by mouth. ?Adjusting the medicines that you take. ?Eating more foods that contain a lot of potassium. ?If your potassium level is very low, you may need to get potassium through an IV and be monitored in the hospital. ?Follow these instructions at home: ? ?Take over-the-counter and prescription medicines only as told by your health care provider. This includes vitamins and supplements. ?Eat a healthy diet. A healthy diet includes fresh fruits and vegetables, whole grains, healthy fats, and lean proteins. ?If instructed, eat more foods that contain a lot of potassium. This includes: ?Nuts, such as  peanuts and pistachios. ?Seeds, such as sunflower seeds and pumpkin seeds. ?Peas, lentils, and lima beans. ?Whole grain and bran cereals and breads. ?Fresh fruits and vegetables, such as apricots, avocado, bananas, cantaloupe, kiwi, oranges, tomatoes, asparagus, and potatoes. ?Orange juice. ?Tomato juice. ?Red meats. ?Yogurt. ?Keep all follow-up visits as told by your health care provider. This is important. ?Contact a health care provider if you: ?Have weakness that gets worse. ?Feel your heart pounding or racing. ?Vomit. ?Have diarrhea. ?Have diabetes (diabetes mellitus) and you have trouble keeping your blood sugar (glucose) in your target range. ?Get help right away if you: ?Have chest pain. ?Have shortness of breath. ?Have vomiting or diarrhea that lasts for more than 2 days. ?Faint. ?Summary ?Hypokalemia means that the amount of potassium in the blood is lower than normal. ?This condition is diagnosed with a blood test. ?Hypokalemia may be treated by taking potassium supplements, adjusting the medicines that you take, or eating more foods that are high in potassium. ?If your potassium level is very low, you may need to get potassium through an IV and be monitored in the hospital. ?This information is not intended to replace advice given to you by your health care provider. Make sure you discuss any questions you have with your health care provider. ?Document Revised: 06/22/2018 Document Reviewed: 06/23/2018 ?[employer] Patient Education ? [employer]. ? ?

## 2022-03-05 NOTE — Progress Notes (Signed)
Chief Complaint  Patient presents with   Follow-up    No concerns, feeling well overall. Had back surgery 2 wks ago with Dr.Cintron   F/u with husband  1. S/p left mastectomy doing well still healing appt 03/06/22 for radiation and will be on oral chemo 2. Htn on norvasc 10 mg and lis 40 mg qd  Bp low normal will cut norvasc in 1/2  Dm 2 a1c 5.5 on jardiance 25 mg qd    Review of Systems  Constitutional:  Negative for weight loss.  HENT:  Negative for hearing loss.   Eyes:  Negative for blurred vision.  Respiratory:  Negative for shortness of breath.   Cardiovascular:  Negative for chest pain.  Gastrointestinal:  Negative for abdominal pain and blood in stool.  Genitourinary:  Negative for dysuria.  Musculoskeletal:  Negative for falls and joint pain.  Skin:  Negative for rash.  Neurological:  Negative for headaches.  Psychiatric/Behavioral:  Negative for depression.   Past Medical History:  Diagnosis Date   Anemia    Breast cancer (HCC) 02/17/2022   Breast cancer in female [facility]) 05/08/2021   Chronic left shoulder pain    Coronary artery disease    x 1 stent   Diabetes mellitus without complication (HCC)    Dysrhythmia    Hypertension    Hypomagnesemia 09/13/2021   Paralysis (HCC)    weakness left upper amd lower extremity   PONV (postoperative nausea and vomiting)    Stroke [facility])    Past Surgical History:  Procedure Laterality Date   BREAST BIOPSY Left 05/07/2021   12:00 5cmfn vision clip path pending   BREAST BIOPSY Left 05/07/2021   11:00 10 cmfn mini cork clip path pending   CAROTID PTA/STENT INTERVENTION Left 07/20/2019   Procedure: CAROTID PTA/STENT INTERVENTION;  Surgeon: Renford Dills, MD;  Location: [facility] INVASIVE CV LAB;  Service: Cardiovascular;  Laterality: Left;   ECTOPIC PREGNANCY SURGERY     PORTACATH PLACEMENT N/A 06/06/2021   Procedure: INSERTION PORT-A-CATH;  Surgeon: Carolan Shiver, MD;  Location: [facility] ORS;  Service: General;  Laterality:  N/A;   SENTINEL NODE BIOPSY Left 02/17/2022   Procedure: SENTINEL NODE BIOPSY;  Surgeon: Carolan Shiver, MD;  Location: [facility] ORS;  Service: General;  Laterality: Left;   TOTAL MASTECTOMY Left 02/17/2022   Procedure: TOTAL MASTECTOMY;  Surgeon: Carolan Shiver, MD;  Location: [facility] ORS;  Service: General;  Laterality: Left;   Family History  Problem Relation Age of Onset   Diabetes type II Mother    Hypertension Father    Heart attack Father    Diabetes type II Brother    Breast cancer Neg Hx    Social History   Socioeconomic History   Marital status: Married    Spouse name: Ivar Drape   Number of children: Not on file   Years of education: Not on file   Highest education level: Not on file  Occupational History   Not on file  Tobacco Use   Smoking status: Former    Packs/day: 0.50    Years: 15.00    Pack years: 7.50    Types: Cigarettes    Quit date: 04/11/2019    Years since quitting: 2.9   Smokeless tobacco: Never  Vaping Use   Vaping Use: Never used  Substance and Sexual Activity   Alcohol use: Never   Drug use: Never   Sexual activity: Not Currently  Other Topics Concern   Not on file  Social History Narrative  No kids    Married with husband    Used to work Nurse, adult    Social Determinants of Corporate investment banker Strain: Low Risk    Difficulty of Paying Living Expenses: Not hard at all  Food Insecurity: No Food Insecurity   Worried About Programme researcher, broadcasting/film/video in the Last Year: Never true   Barista in the Last Year: Never true  Transportation Needs: No Transportation Needs   Lack of Transportation (Medical): No   Lack of Transportation (Non-Medical): No  Physical Activity: Not on file  Stress: Not on file  Social Connections: Not on file  Intimate Partner Violence: Not At Risk   Fear of Current or Ex-Partner: No   Emotionally Abused: No   Physically Abused: No   Sexually Abused: No   Current Meds   Medication Sig   aspirin 81 MG EC tablet Take 1 tablet (81 mg total) by mouth daily.   atorvastatin (LIPITOR) 80 MG tablet Take 1 tablet (80 mg total) by mouth daily.   blood glucose meter kit and supplies 1 each by Other route 4 (four) times daily. Dispense based on patient and insurance preference. Four times daily as directed. (FOR ICD-10 E10.9, E11.9).   carvedilol (COREG) 25 MG tablet Take 1 tablet (25 mg total) by mouth 2 (two) times daily with a meal.   clopidogrel (PLAVIX) 75 MG tablet Take 1 tablet (75 mg total) by mouth daily. Further refills [facility]   Continuous Blood Gluc Sensor (FREESTYLE LIBRE 2 SENSOR) MISC USE TO CHECK BLOOD SUGAR AT LEAST EVERY 8 HOURS   dexamethasone (DECADRON) 2 MG tablet Take 1 tablet (2 mg total) by mouth See admin instructions. Take 2mg  daily for 2 days after each chemotherapy.   empagliflozin (JARDIANCE) 25 MG TABS tablet Take 1 tablet (25 mg total) by mouth daily.   HYDROcodone-acetaminophen (NORCO/VICODIN) 5-325 MG tablet Take 1 tablet by mouth every 4 (four) hours as needed for moderate pain.   lidocaine-prilocaine (EMLA) cream Apply to affected area once   lisinopril (ZESTRIL) 40 MG tablet Take 1 tablet (40 mg total) by mouth daily.   loperamide (IMODIUM) 2 MG capsule Take 1 capsule (2 mg total) by mouth See admin instructions. Take 4mg  at the onset of diarrhea, and then 2mg  after each loose bowel movement, maximum 16mg  per 24 hours.   loratadine (CLARITIN) 10 MG tablet Take 1 tablet (10 mg total) by mouth daily.   magnesium chloride (SLOW-MAG) 64 MG TBEC SR tablet Take 1 tablet (64 mg total) by mouth daily.   Multiple Vitamin (MULTIVITAMIN) tablet Take 1 tablet by mouth daily.   ondansetron (ZOFRAN) 8 MG tablet Take 1 tablet (8 mg total) by mouth 2 (two) times daily as needed. Start on the third day after chemotherapy. (Patient taking differently: Take 8 mg by mouth 2 (two) times daily as needed for nausea or vomiting. Start on the third day after  chemotherapy.)   potassium chloride SA (KLOR-CON M20) 20 MEQ tablet Take 1 tablet (20 mEq total) by mouth daily. Clarify its 1x per day   prochlorperazine (COMPAZINE) 10 MG tablet Take 1 tablet (10 mg total) by mouth every 6 (six) hours as needed (Nausea or vomiting).   vitamin B-12 (CYANOCOBALAMIN) 1000 MCG tablet Take 1 tablet (1,000 mcg total) by mouth daily.   Vitamin D, Cholecalciferol, 25 MCG (1000 UT) TABS Take 1,000 Units by mouth.   [DISCONTINUED] amLODipine (NORVASC) 10 MG tablet Take 1 tablet (  10 mg total) by mouth daily.   No Known Allergies Recent Results (from the past 2160 hour(s))  Comprehensive metabolic panel     Status: Abnormal   Collection Time: 12/13/21  8:13 AM  Result Value Ref Range   Sodium 140 135 - 145 mmol/L   Potassium 3.6 3.5 - 5.1 mmol/L   Chloride 111 98 - 111 mmol/L   CO2 22 22 - 32 mmol/L   Glucose, Bld 173 (H) 70 - 99 mg/dL    Comment: Glucose reference range applies only to samples taken after fasting for at least 8 hours.   BUN 16 8 - 23 mg/dL   Creatinine, Ser 1.61 0.44 - 1.00 mg/dL   Calcium 9.6 8.9 - 09.6 mg/dL   Total Protein 6.2 (L) 6.5 - 8.1 g/dL   Albumin 3.9 3.5 - 5.0 g/dL   AST 19 15 - 41 U/L   ALT 23 0 - 44 U/L   Alkaline Phosphatase 40 38 - 126 U/L   Total Bilirubin 0.1 (L) 0.3 - 1.2 mg/dL   GFR, Estimated >04 >54 mL/min    Comment: (NOTE) Calculated using the CKD-EPI Creatinine Equation (2021)    Anion gap 7 5 - 15    Comment: Performed at [facility] [address] [place], [place] [address]  CBC with Differential/Platelet     Status: Abnormal   Collection Time: 12/13/21  8:13 AM  Result Value Ref Range   WBC 2.9 (L) 4.0 - 10.5 K/uL   RBC 3.42 (L) 3.87 - 5.11 MIL/uL   Hemoglobin 10.3 (L) 12.0 - 15.0 g/dL   HCT 91.4 (L) 78.2 - 95.6 %   MCV 95.3 80.0 - 100.0 fL   MCH 30.1 26.0 - 34.0 pg   MCHC 31.6 30.0 - 36.0 g/dL   RDW 21.3 (H) 08.6 - 57.8 %   Platelets 162 150 - 400 K/uL   nRBC 0.0 0.0 - 0.2 %   Neutrophils  Relative % 42 %   Neutro Abs 1.2 (L) 1.7 - 7.7 K/uL   Lymphocytes Relative 44 %   Lymphs Abs 1.3 0.7 - 4.0 K/uL   Monocytes Relative 9 %   Monocytes Absolute 0.3 0.1 - 1.0 K/uL   Eosinophils Relative 2 %   Eosinophils Absolute 0.1 0.0 - 0.5 K/uL   Basophils Relative 1 %   Basophils Absolute 0.0 0.0 - 0.1 K/uL   Immature Granulocytes 2 %   Abs Immature Granulocytes 0.05 0.00 - 0.07 K/uL    Comment: Performed at [facility], [address] [place], [place] [address]  Magnesium     Status: Abnormal   Collection Time: 12/13/21  8:13 AM  Result Value Ref Range   Magnesium 1.6 (L) 1.7 - 2.4 mg/dL    Comment: Performed at [facility], [address] [place], [place] [address]  CBC with Differential     Status: Abnormal   Collection Time: 12/20/21  8:20 AM  Result Value Ref Range   WBC 3.6 (L) 4.0 - 10.5 K/uL   RBC 3.57 (L) 3.87 - 5.11 MIL/uL   Hemoglobin 10.7 (L) 12.0 - 15.0 g/dL   HCT 13.2 (L) 44.0 - 10.2 %   MCV 97.2 80.0 - 100.0 fL   MCH 30.0 26.0 - 34.0 pg   MCHC 30.8 30.0 - 36.0 g/dL   RDW 72.5 (H) 36.6 - 44.0 %   Platelets 167 150 - 400 K/uL   nRBC 0.0 0.0 - 0.2 %   Neutrophils Relative %  51 %   Neutro Abs 1.8 1.7 - 7.7 K/uL   Lymphocytes Relative 38 %   Lymphs Abs 1.4 0.7 - 4.0 K/uL   Monocytes Relative 7 %   Monocytes Absolute 0.2 0.1 - 1.0 K/uL   Eosinophils Relative 1 %   Eosinophils Absolute 0.1 0.0 - 0.5 K/uL   Basophils Relative 1 %   Basophils Absolute 0.0 0.0 - 0.1 K/uL   Immature Granulocytes 2 %   Abs Immature Granulocytes 0.08 (H) 0.00 - 0.07 K/uL    Comment: Performed at [facility], [address] [place], [place] [address]  Comprehensive metabolic panel     Status: Abnormal   Collection Time: 12/20/21  8:20 AM  Result Value Ref Range   Sodium 139 135 - 145 mmol/L   Potassium 3.9 3.5 - 5.1 mmol/L   Chloride 109 98 - 111 mmol/L   CO2 21 (L) 22 - 32 mmol/L   Glucose, Bld 156 (H) 70 - 99 mg/dL    Comment: Glucose reference  range applies only to samples taken after fasting for at least 8 hours.   BUN 14 8 - 23 mg/dL   Creatinine, Ser 6.04 0.44 - 1.00 mg/dL   Calcium 9.3 8.9 - 54.0 mg/dL   Total Protein 6.5 6.5 - 8.1 g/dL   Albumin 3.7 3.5 - 5.0 g/dL   AST 20 15 - 41 U/L   ALT 28 0 - 44 U/L   Alkaline Phosphatase 41 38 - 126 U/L   Total Bilirubin 0.5 0.3 - 1.2 mg/dL   GFR, Estimated >98 >11 mL/min    Comment: (NOTE) Calculated using the CKD-EPI Creatinine Equation (2021)    Anion gap 9 5 - 15    Comment: Performed at [facility], [address] [place], [place] [address]  Magnesium     Status: None   Collection Time: 12/20/21  8:20 AM  Result Value Ref Range   Magnesium 1.7 1.7 - 2.4 mg/dL    Comment: Performed at [facility], [address] [place], [place] [address]  CBC with Differential     Status: Abnormal   Collection Time: 01/03/22  7:58 AM  Result Value Ref Range   WBC 4.7 4.0 - 10.5 K/uL   RBC 3.62 (L) 3.87 - 5.11 MIL/uL   Hemoglobin 10.9 (L) 12.0 - 15.0 g/dL   HCT 13.0 (L) 86.5 - 78.4 %   MCV 95.0 80.0 - 100.0 fL   MCH 30.1 26.0 - 34.0 pg   MCHC 31.7 30.0 - 36.0 g/dL   RDW 69.6 (H) 29.5 - 28.4 %   Platelets 180 150 - 400 K/uL   nRBC 0.4 (H) 0.0 - 0.2 %   Neutrophils Relative % 48 %   Neutro Abs 2.3 1.7 - 7.7 K/uL   Lymphocytes Relative 25 %   Lymphs Abs 1.2 0.7 - 4.0 K/uL   Monocytes Relative 24 %   Monocytes Absolute 1.1 (H) 0.1 - 1.0 K/uL   Eosinophils Relative 1 %   Eosinophils Absolute 0.1 0.0 - 0.5 K/uL   Basophils Relative 0 %   Basophils Absolute 0.0 0.0 - 0.1 K/uL   Immature Granulocytes 2 %   Abs Immature Granulocytes 0.07 0.00 - 0.07 K/uL    Comment: Performed at [facility], [address] [place], [place] [address]  Comprehensive metabolic panel     Status: Abnormal   Collection Time: 01/03/22  7:58 AM  Result Value Ref Range   Sodium 135 135 -  145 mmol/L   Potassium 3.7 3.5 - 5.1 mmol/L   Chloride 107 98 - 111 mmol/L   CO2 21 (L) 22 -  32 mmol/L   Glucose, Bld 166 (H) 70 - 99 mg/dL    Comment: Glucose reference range applies only to samples taken after fasting for at least 8 hours.   BUN 11 8 - 23 mg/dL   Creatinine, Ser 9.62 0.44 - 1.00 mg/dL   Calcium 9.2 8.9 - 95.2 mg/dL   Total Protein 6.5 6.5 - 8.1 g/dL   Albumin 3.6 3.5 - 5.0 g/dL   AST 19 15 - 41 U/L   ALT 19 0 - 44 U/L   Alkaline Phosphatase 45 38 - 126 U/L   Total Bilirubin 0.2 (L) 0.3 - 1.2 mg/dL   GFR, Estimated >84 >13 mL/min    Comment: (NOTE) Calculated using the CKD-EPI Creatinine Equation (2021)    Anion gap 7 5 - 15    Comment: Performed at [facility], [address] [place], [place] [address]  Magnesium     Status: None   Collection Time: 01/03/22  7:58 AM  Result Value Ref Range   Magnesium 1.8 1.7 - 2.4 mg/dL    Comment: Performed at [facility], [address] [place], [place] [address]  CBC with Differential     Status: Abnormal   Collection Time: 01/14/22  7:59 AM  Result Value Ref Range   WBC 4.6 4.0 - 10.5 K/uL   RBC 3.88 3.87 - 5.11 MIL/uL   Hemoglobin 11.6 (L) 12.0 - 15.0 g/dL   HCT 36.6 44.0 - 34.7 %   MCV 93.6 80.0 - 100.0 fL   MCH 29.9 26.0 - 34.0 pg   MCHC 32.0 30.0 - 36.0 g/dL   RDW 42.5 (H) 95.6 - 38.7 %   Platelets 210 150 - 400 K/uL   nRBC 0.9 (H) 0.0 - 0.2 %   Neutrophils Relative % 55 %   Neutro Abs 2.6 1.7 - 7.7 K/uL   Lymphocytes Relative 28 %   Lymphs Abs 1.3 0.7 - 4.0 K/uL   Monocytes Relative 10 %   Monocytes Absolute 0.4 0.1 - 1.0 K/uL   Eosinophils Relative 5 %   Eosinophils Absolute 0.2 0.0 - 0.5 K/uL   Basophils Relative 1 %   Basophils Absolute 0.0 0.0 - 0.1 K/uL   Immature Granulocytes 1 %   Abs Immature Granulocytes 0.06 0.00 - 0.07 K/uL    Comment: Performed at [facility], [address] [place], [place] [address]  Comprehensive metabolic panel     Status: Abnormal   Collection Time: 01/14/22  7:59 AM  Result Value Ref Range   Sodium 137 135 - 145 mmol/L   Potassium  4.0 3.5 - 5.1 mmol/L   Chloride 110 98 - 111 mmol/L   CO2 20 (L) 22 - 32 mmol/L   Glucose, Bld 166 (H) 70 - 99 mg/dL    Comment: Glucose reference range applies only to samples taken after fasting for at least 8 hours.   BUN 13 8 - 23 mg/dL   Creatinine, Ser 2.95 0.44 - 1.00 mg/dL   Calcium 9.6 8.9 - 18.8 mg/dL   Total Protein 6.5 6.5 - 8.1 g/dL   Albumin 3.9 3.5 - 5.0 g/dL   AST 17 15 - 41 U/L   ALT 17 0 - 44 U/L   Alkaline Phosphatase 39 38 - 126 U/L   Total Bilirubin 0.5 0.3 - 1.2 mg/dL  GFR, Estimated >60 >60 mL/min    Comment: (NOTE) Calculated using the CKD-EPI Creatinine Equation (2021)    Anion gap 7 5 - 15    Comment: Performed at [facility], [address] [place], [place] [address]  Magnesium     Status: None   Collection Time: 01/14/22  7:59 AM  Result Value Ref Range   Magnesium 1.8 1.7 - 2.4 mg/dL    Comment: Performed at [facility], [address] [place], [place] [address]  CBC with Differential     Status: Abnormal   Collection Time: 01/21/22  8:17 AM  Result Value Ref Range   WBC 3.3 (L) 4.0 - 10.5 K/uL   RBC 3.65 (L) 3.87 - 5.11 MIL/uL   Hemoglobin 11.1 (L) 12.0 - 15.0 g/dL   HCT 21.3 (L) 08.6 - 57.8 %   MCV 93.4 80.0 - 100.0 fL   MCH 30.4 26.0 - 34.0 pg   MCHC 32.6 30.0 - 36.0 g/dL   RDW 46.9 (H) 62.9 - 52.8 %   Platelets 173 150 - 400 K/uL   nRBC 0.6 (H) 0.0 - 0.2 %   Neutrophils Relative % 47 %   Neutro Abs 1.6 (L) 1.7 - 7.7 K/uL   Lymphocytes Relative 37 %   Lymphs Abs 1.2 0.7 - 4.0 K/uL   Monocytes Relative 6 %   Monocytes Absolute 0.2 0.1 - 1.0 K/uL   Eosinophils Relative 7 %   Eosinophils Absolute 0.2 0.0 - 0.5 K/uL   Basophils Relative 2 %   Basophils Absolute 0.1 0.0 - 0.1 K/uL   Immature Granulocytes 1 %   Abs Immature Granulocytes 0.02 0.00 - 0.07 K/uL    Comment: Performed at [facility] [address] [place], [place] [address]  Comprehensive metabolic panel     Status: Abnormal   Collection Time:  01/21/22  8:17 AM  Result Value Ref Range   Sodium 136 135 - 145 mmol/L   Potassium 3.8 3.5 - 5.1 mmol/L   Chloride 105 98 - 111 mmol/L   CO2 24 22 - 32 mmol/L   Glucose, Bld 108 (H) 70 - 99 mg/dL    Comment: Glucose reference range applies only to samples taken after fasting for at least 8 hours.   BUN 14 8 - 23 mg/dL   Creatinine, Ser 4.01 0.44 - 1.00 mg/dL   Calcium 9.3 8.9 - 02.7 mg/dL   Total Protein 6.3 (L) 6.5 - 8.1 g/dL   Albumin 3.9 3.5 - 5.0 g/dL   AST 23 15 - 41 U/L   ALT 30 0 - 44 U/L   Alkaline Phosphatase 37 (L) 38 - 126 U/L   Total Bilirubin 0.2 (L) 0.3 - 1.2 mg/dL   GFR, Estimated >25 >36 mL/min    Comment: (NOTE) Calculated using the CKD-EPI Creatinine Equation (2021)    Anion gap 7 5 - 15    Comment: Performed at [facility], [address], [place], [place] [address]  Magnesium     Status: Abnormal   Collection Time: 01/21/22  8:17 AM  Result Value Ref Range   Magnesium 1.6 (L) 1.7 - 2.4 mg/dL    Comment: Performed at [facility], [address] [place], [place] [address]  Glucose, capillary     Status: Abnormal   Collection Time: 02/17/22  8:42 AM  Result Value Ref Range   Glucose-Capillary 140 (H) 70 - 99 mg/dL    Comment: Glucose reference range applies only to  samples taken after fasting for at least 8 hours.  Surgical pathology     Status: None   Collection Time: 02/17/22 10:57 AM  Result Value Ref Range   SURGICAL PATHOLOGY      SURGICAL PATHOLOGY CASE: [id number] PATIENT: Tammy Nunez Surgical Pathology Report     Specimen Submitted: A. Sentinel node 1, 2, left breast B. Breast, left C. Sentinel node 3, left breast  Clinical History: C50.812 malignant neoplasm of midline of LT breast female    DIAGNOSIS: A. LYMPH NODES, LEFT SENTINEL #1 AND #2; EXCISION: - TWO LYMPH NODES, NEGATIVE FOR MALIGNANCY (0/2).  B. BREAST, LEFT; SIMPLE MASTECTOMY: - INVASIVE MAMMARY CARCINOMA, NO SPECIAL TYPE, MULTIFOCAL. -  HIGH-GRADE DUCTAL CARCINOMA IN SITU. - CLIPS X2 AND BIOPSY SITES IDENTIFIED. - BENIGN NIPPLE/AREOLA. - ONE LYMPH NODE, NEGATIVE FOR MALIGNANCY (0/1). - SEE CANCER SUMMARY.  C. LYMPH NODE, LEFT SENTINEL #3; EXCISION: - ONE LYMPH NODE, NEGATIVE FOR MALIGNANCY (0/1).  Comment: This case is reviewed together with the initial diagnostic biopsies ([id number]).  While the original diagnostic biopsies appeared to show distinct histologic grading, this is likely secondary to limit ed sampling.  Invasive mammary carcinoma in the current specimen demonstrates at least 3 separate foci, all of which display similar histomorphologic features. There is an extensive background of DCIS with papillary, micropapillary, and cribriform features, adjacent to the invasive ductal carcinoma.  CANCER CASE SUMMARY: INVASIVE CARCINOMA OF THE BREAST Standard(s): AJCC-UICC 8  SPECIMEN Procedure: Total mastectomy Specimen Laterality: Left  TUMOR Histologic Type: Invasive carcinoma of no special type (ductal) Histologic Grade ([place] Histologic Score)                      Glandular (Acinar)/Tubular Differentiation: 2                      Nuclear Pleomorphism: 3                      Mitotic Rate: 3                      Overall Grade: Grade 3 Tumor Size: 38 mm Tumor Focality: Multiple foci of invasive carcinoma Ductal Carcinoma In Situ (DCIS): Present      Positive for extensive intraductal component (EIC) Tumor Extent: Carcinoma does not invade into the der mis or epidermis Lymphatic and/or Vascular Invasion: Not identified  Treatment Effect in the Breast: Treatment Effect in the Breast: No definite response to presurgical therapy in the invasive carcinoma Treatment Effect in the Lymph Nodes: No lymph node metastases and no fibrous scarring or histiocytic aggregates in the nodes Residual Cancer Burden (RCB) from MD Dareen Piano calculator (website below):      Primary Tumor Bed           Primary tumor  bed: 38 mm x 35 mm           Overall cancer cellularity: 60%           Percentage of cancer that is in situ: 40%      Lymph nodes           Number of lymph nodes positive for metastasis: 0           Diameter of largest metastasis: 0 mm      Residual Cancer Burden: 2.169      Residual Cancer Burden Class: RCB-II  [email or web address]  MARGINS Margin Status for Invasive Carcinoma: All margins negative for invasive  carcinoma                      Distance from closest margin: Gr eater than 5 mm                      Specify closest margin: Anterior Margin Status for DCIS: All margins negative for DCIS                      Distance from DCIS to closest margin: 3 mm                      Specify closest margin: Anterior   REGIONAL LYMPH NODES Regional Lymph Node Status: All regional lymph nodes negative for tumor                      Total Number of Lymph Nodes Examined (sentinel and non-sentinel): 4                      Number of Sentinel Nodes Examined: 3  DISTANT METASTASIS Distant Site(s) Involved, if applicable: Not applicable  PATHOLOGIC STAGE CLASSIFICATION (pTNM, AJCC 8th Edition): Modified Classification: y (posttreatment) pT Category: pT2 T Suffix: Not applicable Regional Lymph Nodes Modifier: sn pN Category: pN0 pM Category: Not applicable  SPECIAL STUDIES Breast Biomarker Testing Performed on Previous Biopsy: [id number] Estrogen Receptor (ER) Status: POSITIVE         Percentage of cells with nuclear positivity: Greater than  90%         Average intensity of staining: Strong  Progesterone Receptor (PgR) Status: POSITIVE         Percentage of cells with nuclear positivity: 11-50%         Average intensity of staining: Strong  HER2 (by immunohistochemistry): NEGATIVE (Score 1+)  (v4.8.0.0)  GROSS DESCRIPTION: Intraoperative Consultation:     Labeled: Sentinel node #1 and #2 from left breast     Received: Fresh      Specimen: Sentinel lymph node     Pathologic evaluation performed: Frozen section sentinel node     Diagnosis: TP: 2 lymph nodes, benign on touch preparation     Communicated to: Called to Dr. Hazle Quant at 11:17 AM on 02/17/2022 Katherine Mantle M.D.     Tissue submitted: The lymph nodes are trisected and a touch preparation is performed with Diff-Quik staining.  A. Labeled: Sentinel node #1 and #2 from left breast Received: Fresh Collection time: 10:57 AM on 02/17/2022 Placed into formalin time: 11:19 AM on 02/17/2022 Tissue fragment(s): 2 Size: Range from 0.7-1.1 cm Descrip tion: Received are 2 lymph node candidates with surrounding adipose tissue.  The lymph node candidates are each trisected and a touch preparation with Diff-Quik staining is performed. Entirely submitted in cassettes 1-2 with 1 lymph node candidate per cassette.  B. Labeled: Left breast Received: Fresh Time in fixative: Collected at 11:18 AM on 02/17/2022 and placed in formalin at 11:35 AM on 02/17/2022 Cold ischemic time: Less than 30 minutes Total fixation time: Approximately 29.75 hours Type of mastectomy: Total mastectomy Laterality: Left breast Weight of specimen: 672 grams Size of specimen: 25 (medial to lateral) x 18 (superior to inferior) x 3.1 (anterior to posterior) cm Orientation of specimen: The specimen is received oriented with a long stitch indicating lateral and a short stitch indicating superior. Inking scheme: Anterior = blue, posterior = black, lateral = orange, and skeletal muscle = red  Skin ellipse dimensions  description: The skin ellipse is 14.6  x 9 cm. The skin is brown-tan, smooth, and grossly unremarkable. Nipple/ areola: The nipple is 1.3 cm in diameter and the areola is 3 cm in diameter.  Both are grossly unremarkable. Axillary tail: A distinct portion of axillary tail is not grossly identified. Biopsy site(s): There are 2 biopsy sites.  The more superior biopsy site contains a  cylinder/mini cork clip and the inferior biopsy site contains a vision shaped clip. Number of discrete masses: At least 3 Location of mass(es): 11-12:00 Distance between masses: Mass A (cylinder clip) to mass B (vision clip): 1 cm; mass B to mass C: 0.4 cm; mass A to mass C: 0.4 cm Size of mass(es)/biopsy site(s): Mass A: 1 x 0.9 x 0.8 cm; mass B: 5.4 x 3.5 x 2; mass C: 0.4 x 0.3 x 0.2 cm Description of mass(es)/biopsy site(s): Mass A is white-tan, firm, and well-circumscribed.  Mass B is white-tan, firm, lobulated, and ill-defined with scattered hemorrhagic cysts up to 0.5 cm in greatest dimension.  Within the mass B there are s cattered firmer areas which may represent multiple masses connected by firm fibrous tissue.  Mass C is white, firm, and well-circumscribed with a central area of hemorrhage. Margins: Mass A: Posterior = 0.5 cm, anterior = 2.3 cm, skeletal muscle = 5.2 cm; mass B: Posterior = 1.4 cm, anterior = 0.3 cm, skeletal muscle = 2.5 cm; mass B: Posterior = 0.4 cm, anterior = 1 cm, skeletal muscle = 6.8 cm Gross involvement of skin/fascia/muscle by tumor: None grossly identified. Description of remaining breast: The remainder of the specimen is comprised of yellow lobulated adipose tissue admixed with white, firm fibrous tissue.  The fibrous tissue is more nodular inferior to the masses.  The fat to fibrous tissue ratio is 70:30. Lymph nodes: There is a single 1 x 0.8 x 0.7 cm blue dyed lymph node candidate in the upper outer quadrant.  Block Summary (approximately 10% of mass A and 50% of mass B remain): 1 - representative nipple/areola, perpendicularly sectioned 2 - representa tive mass A with closest approach to posterior margin 3 - representative mass A with clip site and closest approach to mass B 4 - closest anterior and skeletal muscle margins to mass A 5 - 10 - representative sections of mass B      5 - clip site      6 - closest approach to anterior margin       7 - 9 - representative full-thickness section of mass with cysts, submitted sequentially from superior to inferior (orange and yellow ink for orientation of sections to one another)      10 - additional representative section of mass with firm lobulated area 11 - closest posterior and skeletal muscle margins to mass B 12 - mass C, submitted entirely, with closest approach to posterior margin 13 - closest anterior and skeletal muscle margins to mass C 14 - lymph node candidate, bisected and submitted entirely 15 - 16 - representative nodular fibrous tissue inferior to masses 17 - representative upper outer quadrant 18 - representative lower outer quadrant 19 - representative u pper inner quadrant 20 - representative lower inner quadrant  C. Labeled: Sentinel node #3 from left breast Received: Fresh Collection time: 11:03 AM on 02/17/2022 Placed into formalin time: 12:05 PM on 02/17/2022 Tissue fragment(s): 1 Size: 1.2 x 1 x 0.6 cm Description: Received is a portion of yellow lobulated adipose tissue with an embedded  0.7 x 0.7 x 0.4 cm lymph node candidate.  The lymph node candidate is bisected. Entirely submitted in 1 cassette.  RB 02/18/2022  Final Diagnosis performed by Katherine Mantle, MD.   Electronically signed 02/20/2022 2:41:18PM The electronic signature indicates that the named Attending Pathologist has evaluated the specimen Technical component performed at [facility], [address], [place], [place] [address] Lab: [phone] Dir: Jolene Schimke, MD, MMM  Professional component performed at [facility], [facility], [address] [place], [place], [place] [address] Lab: [id number] Dir: Beryle Quant, MD   Glucose, capillary     Status: Abnormal   Collection Time: 02/17/22 12:22 PM  Result Value Ref Range   Glucose-Capillary 171 (H) 70 - 99 mg/dL    Comment: Glucose reference range applies only to samples taken after fasting for at least 8 hours.  Glucose,  capillary     Status: Abnormal   Collection Time: 02/17/22  4:05 PM  Result Value Ref Range   Glucose-Capillary 140 (H) 70 - 99 mg/dL    Comment: Glucose reference range applies only to samples taken after fasting for at least 8 hours.   Objective  Body mass index is 25.21 kg/m. Wt Readings from Last 3 Encounters:  03/05/22 133 lb 6.4 oz (60.5 kg)  03/03/22 134 lb 12.8 oz (61.1 kg)  02/17/22 135 lb (61.2 kg)   Temp Readings from Last 3 Encounters:  03/05/22 98.2 F (36.8 C) (Oral)  03/03/22 98.7 F (37.1 C) (Tympanic)  02/18/22 98.7 F (37.1 C) (Oral)   BP Readings from Last 3 Encounters:  03/05/22 (!) 124/58  03/03/22 125/68  02/18/22 119/60   Pulse Readings from Last 3 Encounters:  03/05/22 72  03/03/22 85  02/18/22 78    Physical Exam Vitals and nursing note reviewed.  Constitutional:      Appearance: Normal appearance. She is well-developed and well-groomed.  HENT:     Head: Normocephalic and atraumatic.  Eyes:     Conjunctiva/sclera: Conjunctivae normal.     Pupils: Pupils are equal, round, and reactive to light.  Cardiovascular:     Rate and Rhythm: Normal rate and regular rhythm.     Heart sounds: Normal heart sounds. No murmur heard. Pulmonary:     Effort: Pulmonary effort is normal.     Breath sounds: Normal breath sounds.  Abdominal:     General: Abdomen is flat. Bowel sounds are normal.     Tenderness: There is no abdominal tenderness.  Musculoskeletal:        General: No tenderness.  Skin:    General: Skin is warm and dry.  Neurological:     General: No focal deficit present.     Mental Status: She is alert and oriented to person, place, and time. Mental status is at baseline.     Cranial Nerves: Cranial nerves 2-12 are intact.     Motor: Motor function is intact.     Coordination: Coordination is intact.     Gait: Gait is intact.  Psychiatric:        Attention and Perception: Attention and perception normal.        Mood and Affect: Mood and  affect normal.        Speech: Speech normal.        Behavior: Behavior normal. Behavior is cooperative.        Thought Content: Thought content normal.        Cognition and Memory: Cognition and memory normal.  Judgment: Judgment normal.    Assessment  Plan  Hypertension associated with diabetes (HCC) - Plan: Microalbumin / creatinine urine ratio, POCT glycosylated hemoglobin (Hb A1C) Cut norvasc to 5 mg qd from 10 Lis 40 mg qd  F/u in 4-6 months  Essential hypertension - Plan: amLODipine (NORVASC) 5 MG tablet  Hematuria, unspecified type - Plan: Urinalysis, Routine w reflex microscopic, Urine Culture  H/O left mastectomy  Rx fitted bra left in 3 months F/u h/o and rad onc  HM Flu shot declines 2019 had  Tdap disc in future for prev declines all vaccines as of 11/30/19   shingrix  prevnar utd pna 23 due in 1 year 09/2021 covid vx had 4/4   Mammogram abnormal 2021 + breast cancer getting chemo Pap out of age window no h/o abnormal  Colonoscopy remotely ? In 1980s per pt Record consider in future vs cologuard  -as of 04/05/21 declines cologuard and colonoscopy will think about this No FH colon cancer      rec D3 4000 IU daily + MVT    Dr. Malvin Johns 03/2020 neurology call to reschedule  Provider: Dr. French Ana McLean-Scocuzza-Internal Medicine

## 2022-03-06 ENCOUNTER — Encounter: Payer: Self-pay | Admitting: Radiation Oncology

## 2022-03-06 ENCOUNTER — Ambulatory Visit
Admission: RE | Admit: 2022-03-06 | Discharge: 2022-03-06 | Disposition: A | Discharge status: Home / Self Care | Payer: PPO | Source: Ambulatory Visit | Attending: Radiation Oncology | Admitting: Radiation Oncology

## 2022-03-06 VITALS — BP 132/73 | HR 88 | Temp 99.0°F | Resp 16 | Wt 133.4 lb

## 2022-03-06 DIAGNOSIS — I2510 Atherosclerotic heart disease of native coronary artery without angina pectoris: Secondary | ICD-10-CM | POA: Insufficient documentation

## 2022-03-06 DIAGNOSIS — Z79899 Other long term (current) drug therapy: Secondary | ICD-10-CM | POA: Diagnosis not present

## 2022-03-06 DIAGNOSIS — Z8673 Personal history of transient ischemic attack (TIA), and cerebral infarction without residual deficits: Secondary | ICD-10-CM | POA: Diagnosis not present

## 2022-03-06 DIAGNOSIS — C50412 Malignant neoplasm of upper-outer quadrant of left female breast: Secondary | ICD-10-CM | POA: Insufficient documentation

## 2022-03-06 DIAGNOSIS — Z87891 Personal history of nicotine dependence: Secondary | ICD-10-CM | POA: Insufficient documentation

## 2022-03-06 DIAGNOSIS — Z170 Estrogen receptor positive status [ER+]: Secondary | ICD-10-CM | POA: Diagnosis not present

## 2022-03-06 DIAGNOSIS — Z7984 Long term (current) use of oral hypoglycemic drugs: Secondary | ICD-10-CM | POA: Insufficient documentation

## 2022-03-06 DIAGNOSIS — Z7982 Long term (current) use of aspirin: Secondary | ICD-10-CM | POA: Diagnosis not present

## 2022-03-06 DIAGNOSIS — E119 Type 2 diabetes mellitus without complications: Secondary | ICD-10-CM | POA: Insufficient documentation

## 2022-03-06 DIAGNOSIS — C50919 Malignant neoplasm of unspecified site of unspecified female breast: Secondary | ICD-10-CM

## 2022-03-06 DIAGNOSIS — Z9012 Acquired absence of left breast and nipple: Secondary | ICD-10-CM | POA: Insufficient documentation

## 2022-03-06 DIAGNOSIS — I10 Essential (primary) hypertension: Secondary | ICD-10-CM | POA: Diagnosis not present

## 2022-03-06 LAB — URINE CULTURE
MICRO NUMBER:: 13254173
SPECIMEN QUALITY:: ADEQUATE

## 2022-03-06 LAB — URINALYSIS, ROUTINE W REFLEX MICROSCOPIC
Bacteria, UA: NONE SEEN /HPF
Bilirubin Urine: NEGATIVE
Hgb urine dipstick: NEGATIVE
Hyaline Cast: NONE SEEN /LPF
Ketones, ur: NEGATIVE
Nitrite: NEGATIVE
Protein, ur: NEGATIVE
RBC / HPF: NONE SEEN /HPF (ref 0–2)
Specific Gravity, Urine: 1.019 (ref 1.001–1.035)
pH: <=5 (ref 5.0–8.0)

## 2022-03-06 LAB — MICROALBUMIN / CREATININE URINE RATIO
Creatinine, Urine: 72 mg/dL (ref 20–275)
Microalb Creat Ratio: 8 mcg/mg creat (ref <30)
Microalb, Ur: 0.6 mg/dL

## 2022-03-06 LAB — MICROSCOPIC MESSAGE

## 2022-03-06 NOTE — Consult Note (Signed)
NEW PATIENT EVALUATION  Name: Tammy Nunez  MRN: [id number]  Date:   03/06/2022     DOB: 17-Nov-1951   This 71 y.o. female patient presents to the clinic for initial evaluation of pathologic stage pT2 N0 M0) grade 3 invasive mammary carcinoma status post neoadjuvant chemotherapy with 3.8 cm residual tumor with encroachment on the pectoralis muscle..  Prior to neoadjuvant chemotherapy this was a stage IIIb (T4 N0 M0) invasive mammary carcinoma  REFERRING PHYSICIAN: McLean-Scocuzza, French Ana *  CHIEF COMPLAINT:  Chief Complaint  Patient presents with   Breast Cancer    Initial consult    DIAGNOSIS: The encounter diagnosis was Invasive carcinoma of breast (HCC).   PREVIOUS INVESTIGATIONS:  MRI scans mammograms ultrasound reviewed Clinical notes reviewed Pathology reports reviewed  HPI: Patient is a 71 year old female who presents with an abnormal mammogram of her left breast.  There are multiple irregular masses in the left upper breast spanning approximately 8.2 cm highly concerning for malignancy.  She had at least 2 adjacent satellite masses.  Ultrasound of her axilla showed no evidence of abnormal lymph nodes.  Biopsy was positive for grade 3 invasive mammary carcinoma.  MRI scan showed biopsy-proven malignancy in the upper outer left breast spanning 8.3 x 4.2 x 4 cm which abutted the anterior aspect of the left pectoralis muscle.  No MR evidence of lymph nodes or right breast malignancy.  She received neoadjuvant chemotherapy with AC plus Taxol.  She tolerated chemotherapy fairly well did develop UTI necessitating a slight treatment break.  She then underwent left modified radical mastectomy with lymph node sampling.  Tumor was overall grade 3 measuring 3.8 cm after neoadjuvant chemotherapy.  Margins were clear at greater than 5 mm.  DCIS was present and margin was clear at 3 mm.  4 lymph nodes were examined all negative for metastatic disease.  She has done fairly well postoperatively.   She specifically denies chest wall pain or discomfort.  She is having no swelling in her left upper extremity.  She was presented at our tumor conference based on her poor prognostic factors including extreme large size of original tumor as well as abutment of the pectoralis muscle she would be a candidate for chest wall radiation therapy.  PLANNED TREATMENT REGIMEN: Left chest wall radiation  PAST MEDICAL HISTORY:  has a past medical history of Anemia, Breast cancer (HCC) (02/17/2022), Breast cancer in female [facility]) (05/08/2021), Chronic left shoulder pain, Coronary artery disease, Diabetes mellitus without complication (HCC), Dysrhythmia, Hypertension, Hypomagnesemia (09/13/2021), Paralysis (HCC), PONV (postoperative nausea and vomiting), and Stroke (HCC).    PAST SURGICAL HISTORY:  Past Surgical History:  Procedure Laterality Date   BREAST BIOPSY Left 05/07/2021   12:00 5cmfn vision clip path pending   BREAST BIOPSY Left 05/07/2021   11:00 10 cmfn mini cork clip path pending   CAROTID PTA/STENT INTERVENTION Left 07/20/2019   Procedure: CAROTID PTA/STENT INTERVENTION;  Surgeon: Renford Dills, MD;  Location: [facility] INVASIVE CV LAB;  Service: Cardiovascular;  Laterality: Left;   ECTOPIC PREGNANCY SURGERY     PORTACATH PLACEMENT N/A 06/06/2021   Procedure: INSERTION PORT-A-CATH;  Surgeon: Carolan Shiver, MD;  Location: [facility] ORS;  Service: General;  Laterality: N/A;   SENTINEL NODE BIOPSY Left 02/17/2022   Procedure: SENTINEL NODE BIOPSY;  Surgeon: Carolan Shiver, MD;  Location: [facility] ORS;  Service: General;  Laterality: Left;   TOTAL MASTECTOMY Left 02/17/2022   Procedure: TOTAL MASTECTOMY;  Surgeon: Carolan Shiver, MD;  Location: [facility] ORS;  Service: General;  Laterality:  Left;    FAMILY HISTORY: family history includes Diabetes type II in her brother and mother; Heart attack in her father; Hypertension in her father.  SOCIAL HISTORY:  reports that she quit smoking about 2  years ago. Her smoking use included cigarettes. She has a 7.50 pack-year smoking history. She has never used smokeless tobacco. She reports that she does not drink alcohol and does not use drugs.  ALLERGIES: Patient has no known allergies.  MEDICATIONS:  Current Outpatient Medications  Medication Sig Dispense Refill   amLODipine (NORVASC) 5 MG tablet Take 1 tablet (5 mg total) by mouth daily. 90 tablet 3   aspirin 81 MG EC tablet Take 1 tablet (81 mg total) by mouth daily. 90 tablet 3   atorvastatin (LIPITOR) 80 MG tablet Take 1 tablet (80 mg total) by mouth daily. 90 tablet 3   blood glucose meter kit and supplies 1 each by Other route 4 (four) times daily. Dispense based on patient and insurance preference. Four times daily as directed. (FOR ICD-10 E10.9, E11.9). 1 each 0   carvedilol (COREG) 25 MG tablet Take 1 tablet (25 mg total) by mouth 2 (two) times daily with a meal. 180 tablet 3   clopidogrel (PLAVIX) 75 MG tablet Take 1 tablet (75 mg total) by mouth daily. Further refills [facility] neurology 90 tablet 3   Continuous Blood Gluc Sensor (FREESTYLE LIBRE 2 SENSOR) MISC USE TO CHECK BLOOD SUGAR AT LEAST EVERY 8 HOURS 6 each 0   empagliflozin (JARDIANCE) 25 MG TABS tablet Take 1 tablet (25 mg total) by mouth daily. 90 tablet 3   HYDROcodone-acetaminophen (NORCO/VICODIN) 5-325 MG tablet Take 1 tablet by mouth every 4 (four) hours as needed for moderate pain. 16 tablet 0   lidocaine-prilocaine (EMLA) cream Apply to affected area once 30 g 3   lisinopril (ZESTRIL) 40 MG tablet Take 1 tablet (40 mg total) by mouth daily. 90 tablet 3   loperamide (IMODIUM) 2 MG capsule Take 1 capsule (2 mg total) by mouth See admin instructions. Take 4mg  at the onset of diarrhea, and then 2mg  after each loose bowel movement, maximum 16mg  per 24 hours. 30 capsule 1   loratadine (CLARITIN) 10 MG tablet Take 1 tablet (10 mg total) by mouth daily. 90 tablet 3   magnesium chloride (SLOW-MAG) 64 MG TBEC SR tablet Take 1  tablet (64 mg total) by mouth daily. 30 tablet 1   Multiple Vitamin (MULTIVITAMIN) tablet Take 1 tablet by mouth daily.     ondansetron (ZOFRAN) 8 MG tablet Take 1 tablet (8 mg total) by mouth 2 (two) times daily as needed. Start on the third day after chemotherapy. (Patient taking differently: Take 8 mg by mouth 2 (two) times daily as needed for nausea or vomiting. Start on the third day after chemotherapy.) 30 tablet 1   potassium chloride SA (KLOR-CON M20) 20 MEQ tablet Take 1 tablet (20 mEq total) by mouth daily. Clarify its 1x per day 90 tablet 3   prochlorperazine (COMPAZINE) 10 MG tablet Take 1 tablet (10 mg total) by mouth every 6 (six) hours as needed (Nausea or vomiting). 30 tablet 1   vitamin B-12 (CYANOCOBALAMIN) 1000 MCG tablet Take 1 tablet (1,000 mcg total) by mouth daily. 30 tablet 1   Vitamin D, Cholecalciferol, 25 MCG (1000 UT) TABS Take 1,000 Units by mouth.     dexamethasone (DECADRON) 2 MG tablet Take 1 tablet (2 mg total) by mouth See admin instructions. Take 2mg  daily for 2 days after each  chemotherapy. (Patient not taking: Reported on 03/06/2022) 10 tablet 0   No current facility-administered medications for this encounter.   Facility-Administered Medications Ordered in Other Encounters  Medication Dose Route Frequency Provider Last Rate Last Admin   heparin lock flush 100 UNIT/ML injection             ECOG PERFORMANCE STATUS:  0 - Asymptomatic  REVIEW OF SYSTEMS: Patient denies any weight loss, fatigue, weakness, fever, chills or night sweats. Patient denies any loss of vision, blurred vision. Patient denies any ringing  of the ears or hearing loss. No irregular heartbeat. Patient denies heart murmur or history of fainting. Patient denies any chest pain or pain radiating to her upper extremities. Patient denies any shortness of breath, difficulty breathing at night, cough or hemoptysis. Patient denies any swelling in the lower legs. Patient denies any nausea vomiting,  vomiting of blood, or coffee ground material in the vomitus. Patient denies any stomach pain. Patient states has had normal bowel movements no significant constipation or diarrhea. Patient denies any dysuria, hematuria or significant nocturia. Patient denies any problems walking, swelling in the joints or loss of balance. Patient denies any skin changes, loss of hair or loss of weight. Patient denies any excessive worrying or anxiety or significant depression. Patient denies any problems with insomnia. Patient denies excessive thirst, polyuria, polydipsia. Patient denies any swollen glands, patient denies easy bruising or easy bleeding. Patient denies any recent infections, allergies or URI. Patient "s visual fields have not changed significantly in recent time.   PHYSICAL EXAM: BP 132/73 (BP Location: Right Arm, Patient Position: Sitting)   Pulse 88   Temp 99 F (37.2 C) (Tympanic)   Resp 16   Wt 133 lb 6.4 oz (60.5 kg)   BMI 25.21 kg/m  Patient is status post left modified radical mastectomy incision is well-healed.  No evidence of mass or nodularity is noted.  Right breast is free of dominant mass.  No axillary or supraclavicular adenopathy is appreciated.  Well-developed well-nourished patient in NAD. HEENT reveals PERLA, EOMI, discs not visualized.  Oral cavity is clear. No oral mucosal lesions are identified. Neck is clear without evidence of cervical or supraclavicular adenopathy. Lungs are clear to A&P. Cardiac examination is essentially unremarkable with regular rate and rhythm without murmur rub or thrill. Abdomen is benign with no organomegaly or masses noted. Motor sensory and DTR levels are equal and symmetric in the upper and lower extremities. Cranial nerves II through XII are grossly intact. Proprioception is intact. No peripheral adenopathy or edema is identified. No motor or sensory levels are noted. Crude visual fields are within normal range.  LABORATORY DATA: Pathology reports  reviewed    RADIOLOGY RESULTS: Mammogram and ultrasound reviewed as well as MRI scans all compatible with above-stated findings   IMPRESSION: Stage IIIb based on pectoralis abutment and initial size of her lesion status post neoadjuvant chemotherapy downstaging this to a T2 N0 M0 lesion in 71 year old female  PLAN: Based on the poor prognostic factors including original tumor volume of 8 cm abutting the pectoralis major I believe she would benefit from chest wall radiation therapy.  We will plan on delivering 5040 cGy 28 fractions boosting her scar another 1000 cGy using electron beam.  Risks and benefits of treatment including skin reaction fatigue alteration of blood counts possible inclusion of superficial lung all were reviewed in detail with the patient and her husband.  They both seem to comprehend my treatment plan well.  I have personally  set up and ordered CT simulation for next week.  I would like to take this opportunity to thank you for allowing me to participate in the care of your patient.Carmina Miller, MD

## 2022-03-12 ENCOUNTER — Telehealth: Payer: PPO

## 2022-03-12 ENCOUNTER — Encounter: Payer: Self-pay | Admitting: Oncology

## 2022-03-12 ENCOUNTER — Telehealth: Payer: Self-pay | Admitting: *Deleted

## 2022-03-12 ENCOUNTER — Ambulatory Visit
Admission: RE | Admit: 2022-03-12 | Discharge: 2022-03-12 | Disposition: A | Discharge status: Home / Self Care | Payer: PPO | Source: Ambulatory Visit | Attending: Radiation Oncology | Admitting: Radiation Oncology

## 2022-03-12 DIAGNOSIS — Z510 Encounter for antineoplastic radiation therapy: Secondary | ICD-10-CM | POA: Insufficient documentation

## 2022-03-12 DIAGNOSIS — C50412 Malignant neoplasm of upper-outer quadrant of left female breast: Secondary | ICD-10-CM | POA: Diagnosis not present

## 2022-03-12 DIAGNOSIS — Z170 Estrogen receptor positive status [ER+]: Secondary | ICD-10-CM | POA: Diagnosis not present

## 2022-03-12 NOTE — Telephone Encounter (Signed)
?  Care Management  ? ?Follow Up Note ? ? ?03/12/2022 ?Name: Tammy Nunez MRN: [id number] DOB: 1951/01/10 ? ? ?Referred by: McLean-Scocuzza, Nino Glow, MD ?Reason for referral : Chronic Care Management (HTN, DM) ? ? ?An unsuccessful telephone outreach was attempted today. The patient was referred to the case management team for assistance with care management and care coordination.   Per chart review patient in CT simulation.  RNCM will reschedule appointment. ? ?Follow Up Plan: The care management team will reach out to the patient again over the next 30 days.  ? ?Hubert Azure RN, MSN ?RN Care Management Coordinator ?[place] ?[phone] ?[email or web address] ? ?

## 2022-03-16 DIAGNOSIS — Z510 Encounter for antineoplastic radiation therapy: Secondary | ICD-10-CM | POA: Diagnosis not present

## 2022-03-19 ENCOUNTER — Ambulatory Visit: Payer: PPO

## 2022-03-19 ENCOUNTER — Other Ambulatory Visit: Payer: Self-pay | Admitting: Internal Medicine

## 2022-03-19 DIAGNOSIS — I10 Essential (primary) hypertension: Secondary | ICD-10-CM

## 2022-03-19 DIAGNOSIS — I639 Cerebral infarction, unspecified: Secondary | ICD-10-CM

## 2022-03-20 ENCOUNTER — Other Ambulatory Visit: Payer: Self-pay | Admitting: *Deleted

## 2022-03-20 ENCOUNTER — Ambulatory Visit: Payer: PPO

## 2022-03-20 DIAGNOSIS — C50919 Malignant neoplasm of unspecified site of unspecified female breast: Secondary | ICD-10-CM

## 2022-03-21 ENCOUNTER — Ambulatory Visit: Payer: PPO

## 2022-03-24 ENCOUNTER — Telehealth: Payer: Self-pay

## 2022-03-24 ENCOUNTER — Ambulatory Visit: Payer: PPO

## 2022-03-24 NOTE — Telephone Encounter (Signed)
-----   Message from [place] sent at 03/03/2022  2:57 PM EDT ----- ?Regarding: revisit chart ?check to see when last tx with chrystal is. Schedule patient 3 weeks after last Radiation ?

## 2022-03-24 NOTE — Telephone Encounter (Signed)
error 

## 2022-03-24 NOTE — Telephone Encounter (Signed)
Please schedule patient for MD only on 7/6. Please notify patient. Thanks  ?

## 2022-03-24 NOTE — Telephone Encounter (Signed)
Please change from 7/6 to 721. Last radiation tx is on 7/7 will need to see Dr. Tasia Catchings 2 weeks after last tx ?

## 2022-03-25 ENCOUNTER — Ambulatory Visit: Payer: PPO

## 2022-03-26 ENCOUNTER — Ambulatory Visit: Payer: PPO

## 2022-03-27 ENCOUNTER — Ambulatory Visit: Payer: PPO

## 2022-03-27 ENCOUNTER — Ambulatory Visit: Payer: PPO | Admitting: Radiation Oncology

## 2022-03-28 ENCOUNTER — Ambulatory Visit: Payer: PPO

## 2022-03-31 ENCOUNTER — Ambulatory Visit: Payer: PPO

## 2022-04-01 ENCOUNTER — Ambulatory Visit: Payer: PPO

## 2022-04-02 ENCOUNTER — Ambulatory Visit: Payer: PPO

## 2022-04-03 ENCOUNTER — Ambulatory Visit: Payer: PPO

## 2022-04-04 ENCOUNTER — Ambulatory Visit: Payer: PPO

## 2022-04-04 ENCOUNTER — Ambulatory Visit (INDEPENDENT_AMBULATORY_CARE_PROVIDER_SITE_OTHER): Payer: PPO | Admitting: *Deleted

## 2022-04-04 ENCOUNTER — Telehealth: Payer: PPO

## 2022-04-04 DIAGNOSIS — E1165 Type 2 diabetes mellitus with hyperglycemia: Secondary | ICD-10-CM

## 2022-04-04 DIAGNOSIS — I10 Essential (primary) hypertension: Secondary | ICD-10-CM

## 2022-04-04 NOTE — Patient Instructions (Signed)
Visit Information ? ?Thank you for taking time to visit with me today. Please don't hesitate to contact me if I can be of assistance to you before our next scheduled telephone appointment. ? ?Following are the goals we discussed today:  ?(Copy and paste patient goals from clinical care plan here) ? ?Our next appointment is by telephone on 6/19 at 1400 ? ?Please call the care guide team at [phone] if you need to cancel or reschedule your appointment.  ? ?If you are experiencing a Mental Health or [place] or need someone to talk to, please call the Suicide and Crisis Lifeline: 988 ?call the [place] National Suicide Prevention Lifeline: (714) 779-6502 or TTY: (340)800-7157 TTY 754-506-5846) to talk to a trained counselor ?call 1-800-273-TALK (toll free, 24 hour hotline) ?call 911  ? ?The patient verbalized understanding of instructions, educational materials, and care plan provided today and declined offer to receive copy of patient instructions, educational materials, and care plan.  ? ?Hubert Azure RN, MSN ?RN Care Management Coordinator ?[place] ?[phone] ?[email or web address] ? ?

## 2022-04-04 NOTE — Chronic Care Management (AMB) (Signed)
Chronic Care Management   CCM RN Visit Note  04/04/2022 Name: Tammy Nunez MRN: [id number] DOB: Mar 18, 1951  Subjective: Tammy Nunez is a 71 y.o. year old female who is a primary care patient of McLean-Scocuzza, Pasty Spillers, MD. The care management team was consulted for assistance with disease management and care coordination needs.    Engaged with patient by telephone for follow up visit in response to provider referral for case management and/or care coordination services.   Consent to Services:  The patient was given information about Chronic Care Management services, agreed to services, and gave verbal consent prior to initiation of services.  Please see initial visit note for detailed documentation.   Patient agreed to services and verbal consent obtained.   Assessment: Review of patient past medical history, allergies, medications, health status, including review of consultants reports, laboratory and other test data, was performed as part of comprehensive evaluation and provision of chronic care management services.   SDOH (Social Determinants of Health) assessments and interventions performed:    CCM Care Plan  No Known Allergies  Outpatient Encounter Medications as of 04/04/2022  Medication Sig Note   potassium chloride SA (KLOR-CON M20) 20 MEQ tablet Take 1 tablet (20 mEq total) by mouth daily. Clarify its 1x per day    amLODipine (NORVASC) 5 MG tablet Take 1 tablet (5 mg total) by mouth daily.    aspirin 81 MG EC tablet Take 1 tablet (81 mg total) by mouth daily.    atorvastatin (LIPITOR) 80 MG tablet Take 1 tablet (80 mg total) by mouth daily.    blood glucose meter kit and supplies 1 each by Other route 4 (four) times daily. Dispense based on patient and insurance preference. Four times daily as directed. (FOR ICD-10 E10.9, E11.9).    carvedilol (COREG) 25 MG tablet TAKE 1 TABLET BY MOUTH TWICE DAILY WITH A MEAL    clopidogrel (PLAVIX) 75 MG tablet Take 1 tablet (75 mg  total) by mouth daily. Further refills [facility]    Continuous Blood Gluc Sensor (FREESTYLE LIBRE 2 SENSOR) MISC USE TO CHECK BLOOD SUGAR AT LEAST EVERY 8 HOURS    dexamethasone (DECADRON) 2 MG tablet Take 1 tablet (2 mg total) by mouth See admin instructions. Take 2mg  daily for 2 days after each chemotherapy. (Patient not taking: Reported on 03/06/2022) 02/10/2022: On hold Per MD   empagliflozin (JARDIANCE) 25 MG TABS tablet Take 1 tablet (25 mg total) by mouth daily.    HYDROcodone-acetaminophen (NORCO/VICODIN) 5-325 MG tablet Take 1 tablet by mouth every 4 (four) hours as needed for moderate pain.    lidocaine-prilocaine (EMLA) cream Apply to affected area once    lisinopril (ZESTRIL) 40 MG tablet Take 1 tablet (40 mg total) by mouth daily.    loperamide (IMODIUM) 2 MG capsule Take 1 capsule (2 mg total) by mouth See admin instructions. Take 4mg  at the onset of diarrhea, and then 2mg  after each loose bowel movement, maximum 16mg  per 24 hours.    loratadine (CLARITIN) 10 MG tablet Take 1 tablet (10 mg total) by mouth daily.    magnesium chloride (SLOW-MAG) 64 MG TBEC SR tablet Take 1 tablet (64 mg total) by mouth daily.    Multiple Vitamin (MULTIVITAMIN) tablet Take 1 tablet by mouth daily.    ondansetron (ZOFRAN) 8 MG tablet Take 1 tablet (8 mg total) by mouth 2 (two) times daily as needed. Start on the third day after chemotherapy. (Patient taking differently: Take 8 mg by mouth 2 (two)  times daily as needed for nausea or vomiting. Start on the third day after chemotherapy.)    prochlorperazine (COMPAZINE) 10 MG tablet Take 1 tablet (10 mg total) by mouth every 6 (six) hours as needed (Nausea or vomiting).    vitamin B-12 (CYANOCOBALAMIN) 1000 MCG tablet Take 1 tablet (1,000 mcg total) by mouth daily.    Vitamin D, Cholecalciferol, 25 MCG (1000 UT) TABS Take 1,000 Units by mouth.    Facility-Administered Encounter Medications as of 04/04/2022  Medication   heparin lock flush 100 UNIT/ML  injection    Patient Active Problem List   Diagnosis Date Noted   H/O left mastectomy 03/05/2022   Breast cancer (HCC) 02/17/2022   Hyperlipidemia 12/11/2021   Annual physical exam 10/09/2021   Hypomagnesemia 09/13/2021   Chemotherapy induced nausea and vomiting 06/28/2021   Numbness 06/03/2021   Goals of care, counseling/discussion 05/16/2021   Breast cancer in female [facility]) 05/08/2021   Invasive carcinoma of breast (HCC) 05/08/2021   Abnormal mammogram of left breast 04/25/2021   Obesity, diabetes, and hypertension syndrome (HCC) 10/11/2020   Hypertension associated with diabetes (HCC) 10/11/2020   Cataract of both eyes 12/28/2019   Vitamin D deficiency 12/11/2019   History of stroke 09/07/2019   Left hemiparesis (HCC) 09/07/2019   Carotid stenosis, symptomatic, with infarction (HCC) 07/20/2019   Type 2 diabetes mellitus with hyperglycemia, without long-term current use of insulin (HCC)    AKI (acute kidney injury) (HCC)    Essential hypertension    Seizures (HCC)    New onset type 2 diabetes mellitus (HCC)     Conditions to be addressed/monitored:HTN and DMII  Care Plan : RNCM   General Plan of Care (Adult)  Updates made by Maple Mirza, RN since 04/04/2022 12:00 AM     Problem: KNOWLEDGE DEFICIET RELATED TO SELF CARE MANAGEMENT OF [facility] CONDITIONS   Priority: Medium     Long-Range Goal: PATIENT WILL WORK WITH CCM TEAM TO LEARN SELF HEALTH MANAGEMENT OF CHRONIC MEDICAL CONDITIONS AND EXPRESS EMOTIONS OF UPCOMING SURGERY   Start Date: 02/05/2022  Expected End Date: 02/06/2023  Priority: Medium  Note:   Current Barriers:  Knowledge Deficits related to plan of care for management of HTN and DMII  HX. HTN, does not monitor blood pressures at home often.  Agrees to start monitoring  at least twice a week.  hX diabetes,  fasting blood sugar 180 with recent ranges 80-90'S.  Denies any hypo or hyperglycemia.  Denies any concerns or needs 5/12--Patient had left mastectomy  on 3/27.  Radiation to start next Thursday, May 18.  Reports incision is healed, no open areas or drainage.  Does acknowledge swelling to left arm/axilary area; awaiting OT referral to start.  Fasting blood sugar 98 with recent ranges of 90-100's.  BP has ranged 120-130/60's.  Denies any sadness or depression.  Denies any issues or concerns.  RNCM Clinical Goal(s):  Patient will demonstrate understanding of rationale for each prescribed medication as evidenced by MEDICATION COMPLIANCE    demonstrate ongoing health management independence as evidenced by maintaining Hgb A1C below 6, blood pressures in normal range, obtaining mastectomy surgery        through collaboration with RN Care manager, provider, and care team.   Interventions: 1:1 collaboration with primary care provider regarding development and update of comprehensive plan of care as evidenced by provider attestation and co-signature Inter-disciplinary care team collaboration (see longitudinal plan of care) Evaluation of current treatment plan related to  self management and patient's  adherence to plan as established by provider   Diabetes:  (Status: Goal on Track (progressing): YES.) Long Term Goal   Lab Results  Component Value Date   HGBA1C 5.9 (A) 03/05/2022   @ Assessed patient's understanding of A1c goal: <6.5% Provided education to patient about basic DM disease process; Reviewed medications with patient and discussed importance of medication adherence;        Discussed plans with patient for ongoing care management follow up and provided patient with direct contact information for care management team;      Provided patient with written educational materials related to hypo and hyperglycemia and importance of correct treatment;       Advised patient, providing education and rationale, to check cbg twice daily and record        Screening for signs and symptoms of depression related to chronic disease state;         Congratulated on current A1C  Hypertension: (Status: Goal on Track (progressing): YES.) Long Term Goal  Last practice recorded BP readings:  BP Readings from Last 3 Encounters:  03/06/22 132/73  03/05/22 (!) 124/58  03/03/22 125/68  Most recent eGFR/CrCl: No results found for: EGFR  No components found for: CRCL  Evaluation of current treatment plan related to hypertension self management and patient's adherence to plan as established by provider;   Provided education to patient re: stroke prevention, s/s of heart attack and stroke; Reviewed medications with patient and discussed importance of compliance;  Discussed complications of poorly controlled blood pressure such as heart disease, stroke, circulatory complications, vision complications, kidney impairment, sexual dysfunction;  Discussed and encouraged to keep left arm elevated to help with swelling Discussed depression and encouraged patient to notify someone with these feelings to get assistance  Patient Goals/Self-Care Activities: Take medications as prescribed   Attend all scheduled provider appointments Call provider office for new concerns or questions  check blood sugar at prescribed times: twice daily check feet daily for cuts, sores or redness take the blood sugar meter to all doctor visits drink 6 to 8 glasses of water each day keep feet up while sitting check blood pressure weekly keep a blood pressure log take blood pressure log to all doctor appointments call doctor for signs and symptoms of high blood pressure develop an action plan for high blood pressure keep all doctor appointments take medications for blood pressure exactly as prescribed report new symptoms to your doctor keep left arm elevated  Work with occupational therapy     Plan:The care management team will reach out to the patient again over the next 60 days.  Rhae Lerner RN, MSN RN Care Management Coordinator [place]  [facility] [place] [facility] [phone] [email or web address]

## 2022-04-07 ENCOUNTER — Ambulatory Visit: Payer: PPO

## 2022-04-08 ENCOUNTER — Ambulatory Visit: Payer: PPO

## 2022-04-08 DIAGNOSIS — Z510 Encounter for antineoplastic radiation therapy: Secondary | ICD-10-CM | POA: Insufficient documentation

## 2022-04-08 DIAGNOSIS — Z170 Estrogen receptor positive status [ER+]: Secondary | ICD-10-CM | POA: Insufficient documentation

## 2022-04-08 DIAGNOSIS — C50412 Malignant neoplasm of upper-outer quadrant of left female breast: Secondary | ICD-10-CM | POA: Insufficient documentation

## 2022-04-09 ENCOUNTER — Ambulatory Visit: Payer: PPO

## 2022-04-10 ENCOUNTER — Ambulatory Visit: Payer: PPO | Admitting: Radiation Oncology

## 2022-04-10 ENCOUNTER — Ambulatory Visit: Payer: PPO

## 2022-04-11 ENCOUNTER — Ambulatory Visit: Payer: PPO

## 2022-04-14 ENCOUNTER — Ambulatory Visit: Payer: PPO

## 2022-04-14 ENCOUNTER — Ambulatory Visit: Admission: RE | Admit: 2022-04-14 | Payer: PPO | Source: Ambulatory Visit

## 2022-04-14 ENCOUNTER — Inpatient Hospital Stay: Payer: PPO | Attending: Radiation Oncology

## 2022-04-14 DIAGNOSIS — C50912 Malignant neoplasm of unspecified site of left female breast: Secondary | ICD-10-CM

## 2022-04-14 DIAGNOSIS — Z510 Encounter for antineoplastic radiation therapy: Secondary | ICD-10-CM | POA: Diagnosis not present

## 2022-04-14 DIAGNOSIS — C50412 Malignant neoplasm of upper-outer quadrant of left female breast: Secondary | ICD-10-CM | POA: Insufficient documentation

## 2022-04-14 DIAGNOSIS — E876 Hypokalemia: Secondary | ICD-10-CM

## 2022-04-14 DIAGNOSIS — Z170 Estrogen receptor positive status [ER+]: Secondary | ICD-10-CM | POA: Insufficient documentation

## 2022-04-14 DIAGNOSIS — Z5111 Encounter for antineoplastic chemotherapy: Secondary | ICD-10-CM

## 2022-04-14 LAB — CBC WITH DIFFERENTIAL/PLATELET
Abs Immature Granulocytes: 0.02 10*3/uL (ref 0.00–0.07)
Basophils Absolute: 0 10*3/uL (ref 0.0–0.1)
Basophils Relative: 0 %
Eosinophils Absolute: 0.2 10*3/uL (ref 0.0–0.5)
Eosinophils Relative: 4 %
HCT: 39 % (ref 36.0–46.0)
Hemoglobin: 12.9 g/dL (ref 12.0–15.0)
Immature Granulocytes: 0 %
Lymphocytes Relative: 40 %
Lymphs Abs: 2.2 10*3/uL (ref 0.7–4.0)
MCH: 31.8 pg (ref 26.0–34.0)
MCHC: 33.1 g/dL (ref 30.0–36.0)
MCV: 96.1 fL (ref 80.0–100.0)
Monocytes Absolute: 0.6 10*3/uL (ref 0.1–1.0)
Monocytes Relative: 11 %
Neutro Abs: 2.5 10*3/uL (ref 1.7–7.7)
Neutrophils Relative %: 45 %
Platelets: 171 10*3/uL (ref 150–400)
RBC: 4.06 MIL/uL (ref 3.87–5.11)
RDW: 14 % (ref 11.5–15.5)
WBC: 5.6 10*3/uL (ref 4.0–10.5)
nRBC: 0 % (ref 0.0–0.2)

## 2022-04-14 LAB — COMPREHENSIVE METABOLIC PANEL
ALT: 30 U/L (ref 0–44)
AST: 33 U/L (ref 15–41)
Albumin: 4.2 g/dL (ref 3.5–5.0)
Alkaline Phosphatase: 45 U/L (ref 38–126)
Anion gap: 12 (ref 5–15)
BUN: 16 mg/dL (ref 8–23)
CO2: 22 mmol/L (ref 22–32)
Calcium: 9.6 mg/dL (ref 8.9–10.3)
Chloride: 106 mmol/L (ref 98–111)
Creatinine, Ser: 0.85 mg/dL (ref 0.44–1.00)
GFR, Estimated: >60 mL/min (ref >60)
Glucose, Bld: 133 mg/dL — ABNORMAL HIGH (ref 70–99)
Potassium: 3.9 mmol/L (ref 3.5–5.1)
Sodium: 140 mmol/L (ref 135–145)
Total Bilirubin: 0.3 mg/dL (ref 0.3–1.2)
Total Protein: 6.8 g/dL (ref 6.5–8.1)

## 2022-04-14 LAB — MAGNESIUM: Magnesium: 1.7 mg/dL (ref 1.7–2.4)

## 2022-04-15 ENCOUNTER — Ambulatory Visit: Payer: PPO

## 2022-04-15 ENCOUNTER — Ambulatory Visit
Admission: RE | Admit: 2022-04-15 | Discharge: 2022-04-15 | Disposition: A | Discharge status: Home / Self Care | Payer: PPO | Source: Ambulatory Visit | Attending: Radiation Oncology | Admitting: Radiation Oncology

## 2022-04-15 ENCOUNTER — Other Ambulatory Visit: Payer: Self-pay

## 2022-04-15 DIAGNOSIS — Z510 Encounter for antineoplastic radiation therapy: Secondary | ICD-10-CM | POA: Diagnosis not present

## 2022-04-15 LAB — RAD ONC ARIA SESSION SUMMARY
Course Elapsed Days: 0
Plan Fractions Treated to Date: 1
Plan Prescribed Dose Per Fraction: 1.8 Gy
Plan Total Fractions Prescribed: 28
Plan Total Prescribed Dose: 50.4 Gy
Reference Point Dosage Given to Date: 1.8 Gy
Reference Point Session Dosage Given: 1.8 Gy
Session Number: 1

## 2022-04-16 ENCOUNTER — Ambulatory Visit: Payer: PPO

## 2022-04-16 ENCOUNTER — Ambulatory Visit
Admission: RE | Admit: 2022-04-16 | Discharge: 2022-04-16 | Disposition: A | Discharge status: Home / Self Care | Payer: PPO | Source: Ambulatory Visit | Attending: Radiation Oncology | Admitting: Radiation Oncology

## 2022-04-16 ENCOUNTER — Other Ambulatory Visit: Payer: Self-pay

## 2022-04-16 DIAGNOSIS — Z510 Encounter for antineoplastic radiation therapy: Secondary | ICD-10-CM | POA: Diagnosis not present

## 2022-04-16 LAB — RAD ONC ARIA SESSION SUMMARY
Course Elapsed Days: 1
Plan Fractions Treated to Date: 2
Plan Prescribed Dose Per Fraction: 1.8 Gy
Plan Total Fractions Prescribed: 28
Plan Total Prescribed Dose: 50.4 Gy
Reference Point Dosage Given to Date: 3.6 Gy
Reference Point Session Dosage Given: 1.8 Gy
Session Number: 2

## 2022-04-17 ENCOUNTER — Other Ambulatory Visit: Payer: Self-pay

## 2022-04-17 ENCOUNTER — Ambulatory Visit
Admission: RE | Admit: 2022-04-17 | Discharge: 2022-04-17 | Disposition: A | Discharge status: Home / Self Care | Payer: PPO | Source: Ambulatory Visit | Attending: Radiation Oncology | Admitting: Radiation Oncology

## 2022-04-17 ENCOUNTER — Ambulatory Visit: Payer: PPO

## 2022-04-17 DIAGNOSIS — Z510 Encounter for antineoplastic radiation therapy: Secondary | ICD-10-CM | POA: Diagnosis not present

## 2022-04-17 LAB — RAD ONC ARIA SESSION SUMMARY
Course Elapsed Days: 2
Plan Fractions Treated to Date: 3
Plan Prescribed Dose Per Fraction: 1.8 Gy
Plan Total Fractions Prescribed: 28
Plan Total Prescribed Dose: 50.4 Gy
Reference Point Dosage Given to Date: 5.4 Gy
Reference Point Session Dosage Given: 1.8 Gy
Session Number: 3

## 2022-04-18 ENCOUNTER — Ambulatory Visit: Payer: PPO

## 2022-04-18 ENCOUNTER — Other Ambulatory Visit: Payer: Self-pay

## 2022-04-18 ENCOUNTER — Ambulatory Visit
Admission: RE | Admit: 2022-04-18 | Discharge: 2022-04-18 | Disposition: A | Discharge status: Home / Self Care | Payer: PPO | Source: Ambulatory Visit | Attending: Radiation Oncology | Admitting: Radiation Oncology

## 2022-04-18 DIAGNOSIS — Z510 Encounter for antineoplastic radiation therapy: Secondary | ICD-10-CM | POA: Diagnosis not present

## 2022-04-18 LAB — RAD ONC ARIA SESSION SUMMARY
Course Elapsed Days: 3
Plan Fractions Treated to Date: 4
Plan Prescribed Dose Per Fraction: 1.8 Gy
Plan Total Fractions Prescribed: 28
Plan Total Prescribed Dose: 50.4 Gy
Reference Point Dosage Given to Date: 7.2 Gy
Reference Point Session Dosage Given: 1.8 Gy
Session Number: 4

## 2022-04-22 ENCOUNTER — Ambulatory Visit
Admission: RE | Admit: 2022-04-22 | Discharge: 2022-04-22 | Disposition: A | Discharge status: Home / Self Care | Payer: PPO | Source: Ambulatory Visit | Attending: Radiation Oncology | Admitting: Radiation Oncology

## 2022-04-22 ENCOUNTER — Ambulatory Visit: Payer: PPO

## 2022-04-22 ENCOUNTER — Other Ambulatory Visit: Payer: Self-pay

## 2022-04-22 DIAGNOSIS — Z510 Encounter for antineoplastic radiation therapy: Secondary | ICD-10-CM | POA: Diagnosis not present

## 2022-04-22 LAB — RAD ONC ARIA SESSION SUMMARY
Course Elapsed Days: 7
Plan Fractions Treated to Date: 5
Plan Prescribed Dose Per Fraction: 1.8 Gy
Plan Total Fractions Prescribed: 28
Plan Total Prescribed Dose: 50.4 Gy
Reference Point Dosage Given to Date: 9 Gy
Reference Point Session Dosage Given: 1.8 Gy
Session Number: 5

## 2022-04-23 ENCOUNTER — Ambulatory Visit
Admission: RE | Admit: 2022-04-23 | Discharge: 2022-04-23 | Disposition: A | Discharge status: Home / Self Care | Payer: PPO | Source: Ambulatory Visit | Attending: Radiation Oncology | Admitting: Radiation Oncology

## 2022-04-23 ENCOUNTER — Ambulatory Visit: Payer: PPO

## 2022-04-23 ENCOUNTER — Other Ambulatory Visit: Payer: Self-pay

## 2022-04-23 ENCOUNTER — Telehealth: Payer: Self-pay | Admitting: Internal Medicine

## 2022-04-23 DIAGNOSIS — Z87891 Personal history of nicotine dependence: Secondary | ICD-10-CM

## 2022-04-23 DIAGNOSIS — Z510 Encounter for antineoplastic radiation therapy: Secondary | ICD-10-CM | POA: Diagnosis not present

## 2022-04-23 DIAGNOSIS — I10 Essential (primary) hypertension: Secondary | ICD-10-CM

## 2022-04-23 DIAGNOSIS — E1169 Type 2 diabetes mellitus with other specified complication: Secondary | ICD-10-CM

## 2022-04-23 DIAGNOSIS — I639 Cerebral infarction, unspecified: Secondary | ICD-10-CM

## 2022-04-23 LAB — RAD ONC ARIA SESSION SUMMARY
Course Elapsed Days: 8
Plan Fractions Treated to Date: 6
Plan Prescribed Dose Per Fraction: 1.8 Gy
Plan Total Fractions Prescribed: 28
Plan Total Prescribed Dose: 50.4 Gy
Reference Point Dosage Given to Date: 10.8 Gy
Reference Point Session Dosage Given: 1.8 Gy
Session Number: 6

## 2022-04-24 ENCOUNTER — Other Ambulatory Visit: Payer: Self-pay | Admitting: Internal Medicine

## 2022-04-24 ENCOUNTER — Ambulatory Visit: Payer: PPO

## 2022-04-24 ENCOUNTER — Ambulatory Visit: Admission: RE | Admit: 2022-04-24 | Payer: PPO | Source: Ambulatory Visit

## 2022-04-24 DIAGNOSIS — I10 Essential (primary) hypertension: Secondary | ICD-10-CM

## 2022-04-24 DIAGNOSIS — I639 Cerebral infarction, unspecified: Secondary | ICD-10-CM

## 2022-04-24 MED ORDER — CARVEDILOL 25 MG PO TABS: 25.0000 mg | ORAL_TABLET | Freq: Two times a day (BID) | ORAL | 3 refills | Status: DC

## 2022-04-24 NOTE — Telephone Encounter (Signed)
Sent coreg and pt should be taking norvasc 5 mg daily not 10

## 2022-04-24 NOTE — Telephone Encounter (Signed)
Pt husband called for a refill on carvedilol sent to [facility] [address]

## 2022-04-25 ENCOUNTER — Other Ambulatory Visit: Payer: Self-pay

## 2022-04-25 ENCOUNTER — Ambulatory Visit: Payer: PPO

## 2022-04-25 ENCOUNTER — Ambulatory Visit
Admission: RE | Admit: 2022-04-25 | Discharge: 2022-04-25 | Disposition: A | Discharge status: Home / Self Care | Payer: PPO | Source: Ambulatory Visit | Attending: Radiation Oncology | Admitting: Radiation Oncology

## 2022-04-25 DIAGNOSIS — C50412 Malignant neoplasm of upper-outer quadrant of left female breast: Secondary | ICD-10-CM | POA: Insufficient documentation

## 2022-04-25 DIAGNOSIS — Z170 Estrogen receptor positive status [ER+]: Secondary | ICD-10-CM | POA: Insufficient documentation

## 2022-04-25 DIAGNOSIS — Z510 Encounter for antineoplastic radiation therapy: Secondary | ICD-10-CM | POA: Insufficient documentation

## 2022-04-25 DIAGNOSIS — Z9221 Personal history of antineoplastic chemotherapy: Secondary | ICD-10-CM | POA: Insufficient documentation

## 2022-04-25 LAB — RAD ONC ARIA SESSION SUMMARY
Course Elapsed Days: 10
Plan Fractions Treated to Date: 7
Plan Prescribed Dose Per Fraction: 1.8 Gy
Plan Total Fractions Prescribed: 28
Plan Total Prescribed Dose: 50.4 Gy
Reference Point Dosage Given to Date: 12.6 Gy
Reference Point Session Dosage Given: 1.8 Gy
Session Number: 7

## 2022-04-28 ENCOUNTER — Other Ambulatory Visit: Payer: Self-pay

## 2022-04-28 ENCOUNTER — Inpatient Hospital Stay: Payer: PPO

## 2022-04-28 ENCOUNTER — Ambulatory Visit
Admission: RE | Admit: 2022-04-28 | Discharge: 2022-04-28 | Disposition: A | Discharge status: Home / Self Care | Payer: PPO | Source: Ambulatory Visit | Attending: Radiation Oncology | Admitting: Radiation Oncology

## 2022-04-28 ENCOUNTER — Ambulatory Visit: Payer: PPO

## 2022-04-28 DIAGNOSIS — Z510 Encounter for antineoplastic radiation therapy: Secondary | ICD-10-CM | POA: Diagnosis not present

## 2022-04-28 LAB — RAD ONC ARIA SESSION SUMMARY
Course Elapsed Days: 13
Plan Fractions Treated to Date: 8
Plan Prescribed Dose Per Fraction: 1.8 Gy
Plan Total Fractions Prescribed: 28
Plan Total Prescribed Dose: 50.4 Gy
Reference Point Dosage Given to Date: 14.4 Gy
Reference Point Session Dosage Given: 1.8 Gy
Session Number: 8

## 2022-04-29 ENCOUNTER — Inpatient Hospital Stay: Payer: PPO | Attending: Radiation Oncology

## 2022-04-29 ENCOUNTER — Ambulatory Visit
Admission: RE | Admit: 2022-04-29 | Discharge: 2022-04-29 | Disposition: A | Discharge status: Home / Self Care | Payer: PPO | Source: Ambulatory Visit | Attending: Radiation Oncology | Admitting: Radiation Oncology

## 2022-04-29 ENCOUNTER — Ambulatory Visit: Payer: PPO

## 2022-04-29 ENCOUNTER — Inpatient Hospital Stay: Payer: PPO

## 2022-04-29 ENCOUNTER — Other Ambulatory Visit: Payer: Self-pay

## 2022-04-29 DIAGNOSIS — C50412 Malignant neoplasm of upper-outer quadrant of left female breast: Secondary | ICD-10-CM | POA: Insufficient documentation

## 2022-04-29 DIAGNOSIS — Z9221 Personal history of antineoplastic chemotherapy: Secondary | ICD-10-CM | POA: Insufficient documentation

## 2022-04-29 DIAGNOSIS — C50912 Malignant neoplasm of unspecified site of left female breast: Secondary | ICD-10-CM

## 2022-04-29 DIAGNOSIS — Z170 Estrogen receptor positive status [ER+]: Secondary | ICD-10-CM | POA: Insufficient documentation

## 2022-04-29 DIAGNOSIS — Z95828 Presence of other vascular implants and grafts: Secondary | ICD-10-CM

## 2022-04-29 DIAGNOSIS — C50919 Malignant neoplasm of unspecified site of unspecified female breast: Secondary | ICD-10-CM

## 2022-04-29 DIAGNOSIS — Z510 Encounter for antineoplastic radiation therapy: Secondary | ICD-10-CM | POA: Diagnosis not present

## 2022-04-29 DIAGNOSIS — E876 Hypokalemia: Secondary | ICD-10-CM

## 2022-04-29 DIAGNOSIS — Z5111 Encounter for antineoplastic chemotherapy: Secondary | ICD-10-CM

## 2022-04-29 LAB — COMPREHENSIVE METABOLIC PANEL
ALT: 34 U/L (ref 0–44)
AST: 26 U/L (ref 15–41)
Albumin: 4.1 g/dL (ref 3.5–5.0)
Alkaline Phosphatase: 41 U/L (ref 38–126)
Anion gap: 8 (ref 5–15)
BUN: 17 mg/dL (ref 8–23)
CO2: 23 mmol/L (ref 22–32)
Calcium: 9 mg/dL (ref 8.9–10.3)
Chloride: 109 mmol/L (ref 98–111)
Creatinine, Ser: 0.7 mg/dL (ref 0.44–1.00)
GFR, Estimated: >60 mL/min (ref >60)
Glucose, Bld: 102 mg/dL — ABNORMAL HIGH (ref 70–99)
Potassium: 3.7 mmol/L (ref 3.5–5.1)
Sodium: 140 mmol/L (ref 135–145)
Total Bilirubin: 0.5 mg/dL (ref 0.3–1.2)
Total Protein: 6.7 g/dL (ref 6.5–8.1)

## 2022-04-29 LAB — RAD ONC ARIA SESSION SUMMARY
Course Elapsed Days: 14
Plan Fractions Treated to Date: 9
Plan Prescribed Dose Per Fraction: 1.8 Gy
Plan Total Fractions Prescribed: 28
Plan Total Prescribed Dose: 50.4 Gy
Reference Point Dosage Given to Date: 16.2 Gy
Reference Point Session Dosage Given: 1.8 Gy
Session Number: 9

## 2022-04-29 LAB — CBC
HCT: 38.7 % (ref 36.0–46.0)
Hemoglobin: 12.8 g/dL (ref 12.0–15.0)
MCH: 31.8 pg (ref 26.0–34.0)
MCHC: 33.1 g/dL (ref 30.0–36.0)
MCV: 96 fL (ref 80.0–100.0)
Platelets: 153 10*3/uL (ref 150–400)
RBC: 4.03 MIL/uL (ref 3.87–5.11)
RDW: 13.7 % (ref 11.5–15.5)
WBC: 4.3 10*3/uL (ref 4.0–10.5)
nRBC: 0 % (ref 0.0–0.2)

## 2022-04-29 LAB — MAGNESIUM: Magnesium: 1.7 mg/dL (ref 1.7–2.4)

## 2022-04-29 MED ORDER — SODIUM CHLORIDE 0.9% FLUSH
10.0000 mL | Freq: Once | INTRAVENOUS | Status: AC
  Administered 2022-04-29: 10 mL via INTRAVENOUS
  Filled 2022-04-29: qty 10

## 2022-04-29 MED ORDER — HEPARIN SOD (PORK) LOCK FLUSH 100 UNIT/ML IV SOLN
500.0000 [IU] | Freq: Once | INTRAVENOUS | Status: AC
  Administered 2022-04-29: 500 [IU] via INTRAVENOUS
  Filled 2022-04-29: qty 5

## 2022-04-30 ENCOUNTER — Other Ambulatory Visit: Payer: Self-pay

## 2022-04-30 ENCOUNTER — Ambulatory Visit: Payer: PPO

## 2022-04-30 ENCOUNTER — Ambulatory Visit
Admission: RE | Admit: 2022-04-30 | Discharge: 2022-04-30 | Disposition: A | Discharge status: Home / Self Care | Payer: PPO | Source: Ambulatory Visit | Attending: Radiation Oncology | Admitting: Radiation Oncology

## 2022-04-30 DIAGNOSIS — Z510 Encounter for antineoplastic radiation therapy: Secondary | ICD-10-CM | POA: Diagnosis not present

## 2022-04-30 LAB — RAD ONC ARIA SESSION SUMMARY
Course Elapsed Days: 15
Plan Fractions Treated to Date: 10
Plan Prescribed Dose Per Fraction: 1.8 Gy
Plan Total Fractions Prescribed: 28
Plan Total Prescribed Dose: 50.4 Gy
Reference Point Dosage Given to Date: 18 Gy
Reference Point Session Dosage Given: 1.8 Gy
Session Number: 10

## 2022-05-01 ENCOUNTER — Ambulatory Visit: Payer: PPO

## 2022-05-01 ENCOUNTER — Other Ambulatory Visit: Payer: Self-pay

## 2022-05-01 ENCOUNTER — Ambulatory Visit
Admission: RE | Admit: 2022-05-01 | Discharge: 2022-05-01 | Disposition: A | Discharge status: Home / Self Care | Payer: PPO | Source: Ambulatory Visit | Attending: Radiation Oncology | Admitting: Radiation Oncology

## 2022-05-01 DIAGNOSIS — Z510 Encounter for antineoplastic radiation therapy: Secondary | ICD-10-CM | POA: Diagnosis not present

## 2022-05-01 LAB — RAD ONC ARIA SESSION SUMMARY
Course Elapsed Days: 16
Plan Fractions Treated to Date: 11
Plan Prescribed Dose Per Fraction: 1.8 Gy
Plan Total Fractions Prescribed: 28
Plan Total Prescribed Dose: 50.4 Gy
Reference Point Dosage Given to Date: 19.8 Gy
Reference Point Session Dosage Given: 1.8 Gy
Session Number: 11

## 2022-05-02 ENCOUNTER — Ambulatory Visit
Admission: RE | Admit: 2022-05-02 | Discharge: 2022-05-02 | Disposition: A | Discharge status: Home / Self Care | Payer: PPO | Source: Ambulatory Visit | Attending: Radiation Oncology | Admitting: Radiation Oncology

## 2022-05-02 ENCOUNTER — Ambulatory Visit: Payer: PPO

## 2022-05-02 ENCOUNTER — Other Ambulatory Visit: Payer: Self-pay

## 2022-05-02 DIAGNOSIS — Z510 Encounter for antineoplastic radiation therapy: Secondary | ICD-10-CM | POA: Diagnosis not present

## 2022-05-02 LAB — RAD ONC ARIA SESSION SUMMARY
Course Elapsed Days: 17
Plan Fractions Treated to Date: 12
Plan Prescribed Dose Per Fraction: 1.8 Gy
Plan Total Fractions Prescribed: 28
Plan Total Prescribed Dose: 50.4 Gy
Reference Point Dosage Given to Date: 21.6 Gy
Reference Point Session Dosage Given: 1.8 Gy
Session Number: 12

## 2022-05-05 ENCOUNTER — Other Ambulatory Visit: Payer: Self-pay

## 2022-05-05 ENCOUNTER — Ambulatory Visit
Admission: RE | Admit: 2022-05-05 | Discharge: 2022-05-05 | Disposition: A | Discharge status: Home / Self Care | Payer: PPO | Source: Ambulatory Visit | Attending: Radiation Oncology | Admitting: Radiation Oncology

## 2022-05-05 ENCOUNTER — Ambulatory Visit: Payer: PPO

## 2022-05-05 DIAGNOSIS — Z510 Encounter for antineoplastic radiation therapy: Secondary | ICD-10-CM | POA: Diagnosis not present

## 2022-05-05 LAB — RAD ONC ARIA SESSION SUMMARY
Course Elapsed Days: 20
Plan Fractions Treated to Date: 13
Plan Prescribed Dose Per Fraction: 1.8 Gy
Plan Total Fractions Prescribed: 28
Plan Total Prescribed Dose: 50.4 Gy
Reference Point Dosage Given to Date: 23.4 Gy
Reference Point Session Dosage Given: 1.8 Gy
Session Number: 13

## 2022-05-06 ENCOUNTER — Ambulatory Visit
Admission: RE | Admit: 2022-05-06 | Discharge: 2022-05-06 | Disposition: A | Discharge status: Home / Self Care | Payer: PPO | Source: Ambulatory Visit | Attending: Radiation Oncology | Admitting: Radiation Oncology

## 2022-05-06 ENCOUNTER — Other Ambulatory Visit: Payer: Self-pay

## 2022-05-06 ENCOUNTER — Ambulatory Visit: Payer: PPO

## 2022-05-06 DIAGNOSIS — Z510 Encounter for antineoplastic radiation therapy: Secondary | ICD-10-CM | POA: Diagnosis not present

## 2022-05-06 LAB — RAD ONC ARIA SESSION SUMMARY
Course Elapsed Days: 21
Plan Fractions Treated to Date: 14
Plan Prescribed Dose Per Fraction: 1.8 Gy
Plan Total Fractions Prescribed: 28
Plan Total Prescribed Dose: 50.4 Gy
Reference Point Dosage Given to Date: 25.2 Gy
Reference Point Session Dosage Given: 1.8 Gy
Session Number: 14

## 2022-05-07 ENCOUNTER — Other Ambulatory Visit: Payer: Self-pay

## 2022-05-07 ENCOUNTER — Ambulatory Visit
Admission: RE | Admit: 2022-05-07 | Discharge: 2022-05-07 | Disposition: A | Discharge status: Home / Self Care | Payer: PPO | Source: Ambulatory Visit | Attending: Radiation Oncology | Admitting: Radiation Oncology

## 2022-05-07 DIAGNOSIS — Z510 Encounter for antineoplastic radiation therapy: Secondary | ICD-10-CM | POA: Diagnosis not present

## 2022-05-07 LAB — RAD ONC ARIA SESSION SUMMARY
Course Elapsed Days: 22
Plan Fractions Treated to Date: 15
Plan Prescribed Dose Per Fraction: 1.8 Gy
Plan Total Fractions Prescribed: 28
Plan Total Prescribed Dose: 50.4 Gy
Reference Point Dosage Given to Date: 27 Gy
Reference Point Session Dosage Given: 1.8 Gy
Session Number: 15

## 2022-05-08 ENCOUNTER — Ambulatory Visit
Admission: RE | Admit: 2022-05-08 | Discharge: 2022-05-08 | Disposition: A | Discharge status: Home / Self Care | Payer: PPO | Source: Ambulatory Visit | Attending: Radiation Oncology | Admitting: Radiation Oncology

## 2022-05-08 ENCOUNTER — Other Ambulatory Visit: Payer: Self-pay

## 2022-05-08 DIAGNOSIS — Z510 Encounter for antineoplastic radiation therapy: Secondary | ICD-10-CM | POA: Diagnosis not present

## 2022-05-08 LAB — RAD ONC ARIA SESSION SUMMARY
Course Elapsed Days: 23
Plan Fractions Treated to Date: 16
Plan Prescribed Dose Per Fraction: 1.8 Gy
Plan Total Fractions Prescribed: 28
Plan Total Prescribed Dose: 50.4 Gy
Reference Point Dosage Given to Date: 28.8 Gy
Reference Point Session Dosage Given: 1.8 Gy
Session Number: 16

## 2022-05-09 ENCOUNTER — Other Ambulatory Visit: Payer: Self-pay

## 2022-05-09 ENCOUNTER — Ambulatory Visit
Admission: RE | Admit: 2022-05-09 | Discharge: 2022-05-09 | Disposition: A | Discharge status: Home / Self Care | Payer: PPO | Source: Ambulatory Visit | Attending: Radiation Oncology | Admitting: Radiation Oncology

## 2022-05-09 DIAGNOSIS — Z510 Encounter for antineoplastic radiation therapy: Secondary | ICD-10-CM | POA: Diagnosis not present

## 2022-05-09 LAB — RAD ONC ARIA SESSION SUMMARY
Course Elapsed Days: 24
Plan Fractions Treated to Date: 17
Plan Prescribed Dose Per Fraction: 1.8 Gy
Plan Total Fractions Prescribed: 28
Plan Total Prescribed Dose: 50.4 Gy
Reference Point Dosage Given to Date: 30.6 Gy
Reference Point Session Dosage Given: 1.8 Gy
Session Number: 17

## 2022-05-10 ENCOUNTER — Other Ambulatory Visit: Payer: Self-pay | Admitting: Nurse Practitioner

## 2022-05-10 ENCOUNTER — Ambulatory Visit: Payer: PPO

## 2022-05-12 ENCOUNTER — Telehealth: Payer: PPO

## 2022-05-12 ENCOUNTER — Ambulatory Visit
Admission: RE | Admit: 2022-05-12 | Discharge: 2022-05-12 | Disposition: A | Discharge status: Home / Self Care | Payer: PPO | Source: Ambulatory Visit | Attending: Radiation Oncology | Admitting: Radiation Oncology

## 2022-05-12 ENCOUNTER — Other Ambulatory Visit: Payer: Self-pay

## 2022-05-12 ENCOUNTER — Telehealth: Payer: Self-pay | Admitting: *Deleted

## 2022-05-12 DIAGNOSIS — Z510 Encounter for antineoplastic radiation therapy: Secondary | ICD-10-CM | POA: Diagnosis not present

## 2022-05-12 LAB — RAD ONC ARIA SESSION SUMMARY
Course Elapsed Days: 27
Plan Fractions Treated to Date: 18
Plan Prescribed Dose Per Fraction: 1.8 Gy
Plan Total Fractions Prescribed: 28
Plan Total Prescribed Dose: 50.4 Gy
Reference Point Dosage Given to Date: 32.4 Gy
Reference Point Session Dosage Given: 1.8 Gy
Session Number: 18

## 2022-05-12 NOTE — Telephone Encounter (Signed)
  Care Management   Follow Up Note   05/12/2022 Name: Tammy Nunez MRN: [id number] DOB: 1951-09-16   Referred by: McLean-Scocuzza, Nino Glow, MD Reason for referral : Chronic Care Management (DM, HTN)   An unsuccessful telephone outreach was attempted today. The patient was referred to the case management team for assistance with care management and care coordination.   Follow Up Plan: RNCM will seek assistance from Care Guides in rescheduling appointment within the next 30 days.  Hubert Azure RN, MSN RN Care Management Coordinator [place] [phone] [email or web address]

## 2022-05-13 ENCOUNTER — Other Ambulatory Visit: Payer: Self-pay

## 2022-05-13 ENCOUNTER — Ambulatory Visit
Admission: RE | Admit: 2022-05-13 | Discharge: 2022-05-13 | Disposition: A | Discharge status: Home / Self Care | Payer: PPO | Source: Ambulatory Visit | Attending: Radiation Oncology | Admitting: Radiation Oncology

## 2022-05-13 DIAGNOSIS — Z510 Encounter for antineoplastic radiation therapy: Secondary | ICD-10-CM | POA: Diagnosis not present

## 2022-05-13 LAB — RAD ONC ARIA SESSION SUMMARY
Course Elapsed Days: 28
Plan Fractions Treated to Date: 19
Plan Prescribed Dose Per Fraction: 1.8 Gy
Plan Total Fractions Prescribed: 28
Plan Total Prescribed Dose: 50.4 Gy
Reference Point Dosage Given to Date: 34.2 Gy
Reference Point Session Dosage Given: 1.8 Gy
Session Number: 19

## 2022-05-14 ENCOUNTER — Other Ambulatory Visit: Payer: Self-pay

## 2022-05-14 ENCOUNTER — Ambulatory Visit
Admission: RE | Admit: 2022-05-14 | Discharge: 2022-05-14 | Disposition: A | Discharge status: Home / Self Care | Payer: PPO | Source: Ambulatory Visit | Attending: Radiation Oncology | Admitting: Radiation Oncology

## 2022-05-14 ENCOUNTER — Ambulatory Visit (INDEPENDENT_AMBULATORY_CARE_PROVIDER_SITE_OTHER): Payer: PPO | Admitting: *Deleted

## 2022-05-14 DIAGNOSIS — I10 Essential (primary) hypertension: Secondary | ICD-10-CM

## 2022-05-14 DIAGNOSIS — Z510 Encounter for antineoplastic radiation therapy: Secondary | ICD-10-CM | POA: Diagnosis not present

## 2022-05-14 DIAGNOSIS — E1165 Type 2 diabetes mellitus with hyperglycemia: Secondary | ICD-10-CM

## 2022-05-14 LAB — RAD ONC ARIA SESSION SUMMARY
Course Elapsed Days: 29
Plan Fractions Treated to Date: 20
Plan Prescribed Dose Per Fraction: 1.8 Gy
Plan Total Fractions Prescribed: 28
Plan Total Prescribed Dose: 50.4 Gy
Reference Point Dosage Given to Date: 36 Gy
Reference Point Session Dosage Given: 1.8 Gy
Session Number: 20

## 2022-05-14 NOTE — Chronic Care Management (AMB) (Signed)
  Care Management   Follow Up Note   05/14/2022 Name: Tammy Nunez MRN: [id number] DOB: [birth year]   Referred by: McLean-Scocuzza, Nino Glow, MD Reason for referral : Case Closure   Successful  outreach to patient.  Tolerating radiation well.  Reports blood pressures and blood sugars are well controlled.  Feels individual and personal goals have been met.  Follow Up Plan: The patient has been provided with contact information for the care management team and has been advised to call with any health related questions or concerns.   No further follow up required: as patient has met her goals.  Hubert Azure RN, MSN RN Care Management Coordinator [place] [phone] [email or web address]

## 2022-05-14 NOTE — Patient Instructions (Signed)
CONGRATULATIONS ON COMPLETING YOUR GOALS.  IT AS BEEN A PLEASURE WORKING WITH AND TALKING TO YOU.  IF  NEEDS [place] IN THE FUTURE PLEASE DO NOT HESITATE TO CONTACT ME  [phone]  Neliah Cuyler RN, MSN RN Care Management Coordinator  [facility] [place] [facility] [phone] [email or web address]  

## 2022-05-15 ENCOUNTER — Ambulatory Visit
Admission: RE | Admit: 2022-05-15 | Discharge: 2022-05-15 | Disposition: A | Discharge status: Home / Self Care | Payer: PPO | Source: Ambulatory Visit | Attending: Radiation Oncology | Admitting: Radiation Oncology

## 2022-05-15 ENCOUNTER — Other Ambulatory Visit: Payer: Self-pay

## 2022-05-15 DIAGNOSIS — Z510 Encounter for antineoplastic radiation therapy: Secondary | ICD-10-CM | POA: Diagnosis not present

## 2022-05-15 LAB — RAD ONC ARIA SESSION SUMMARY
Course Elapsed Days: 30
Plan Fractions Treated to Date: 21
Plan Prescribed Dose Per Fraction: 1.8 Gy
Plan Total Fractions Prescribed: 28
Plan Total Prescribed Dose: 50.4 Gy
Reference Point Dosage Given to Date: 37.8 Gy
Reference Point Session Dosage Given: 1.8 Gy
Session Number: 21

## 2022-05-16 ENCOUNTER — Other Ambulatory Visit: Payer: Self-pay

## 2022-05-16 ENCOUNTER — Ambulatory Visit
Admission: RE | Admit: 2022-05-16 | Discharge: 2022-05-16 | Disposition: A | Discharge status: Home / Self Care | Payer: PPO | Source: Ambulatory Visit | Attending: Radiation Oncology | Admitting: Radiation Oncology

## 2022-05-16 DIAGNOSIS — Z510 Encounter for antineoplastic radiation therapy: Secondary | ICD-10-CM | POA: Diagnosis not present

## 2022-05-16 LAB — RAD ONC ARIA SESSION SUMMARY
Course Elapsed Days: 31
Plan Fractions Treated to Date: 22
Plan Prescribed Dose Per Fraction: 1.8 Gy
Plan Total Fractions Prescribed: 28
Plan Total Prescribed Dose: 50.4 Gy
Reference Point Dosage Given to Date: 39.6 Gy
Reference Point Session Dosage Given: 1.8 Gy
Session Number: 22

## 2022-05-19 ENCOUNTER — Ambulatory Visit
Admission: RE | Admit: 2022-05-19 | Discharge: 2022-05-19 | Disposition: A | Discharge status: Home / Self Care | Payer: PPO | Source: Ambulatory Visit | Attending: Radiation Oncology | Admitting: Radiation Oncology

## 2022-05-19 ENCOUNTER — Other Ambulatory Visit: Payer: Self-pay

## 2022-05-19 DIAGNOSIS — Z510 Encounter for antineoplastic radiation therapy: Secondary | ICD-10-CM | POA: Diagnosis not present

## 2022-05-19 LAB — RAD ONC ARIA SESSION SUMMARY
Course Elapsed Days: 34
Plan Fractions Treated to Date: 23
Plan Prescribed Dose Per Fraction: 1.8 Gy
Plan Total Fractions Prescribed: 28
Plan Total Prescribed Dose: 50.4 Gy
Reference Point Dosage Given to Date: 41.4 Gy
Reference Point Session Dosage Given: 1.8 Gy
Session Number: 23

## 2022-05-20 ENCOUNTER — Other Ambulatory Visit: Payer: Self-pay

## 2022-05-20 ENCOUNTER — Ambulatory Visit
Admission: RE | Admit: 2022-05-20 | Discharge: 2022-05-20 | Disposition: A | Discharge status: Home / Self Care | Payer: PPO | Source: Ambulatory Visit | Attending: Radiation Oncology | Admitting: Radiation Oncology

## 2022-05-20 DIAGNOSIS — Z510 Encounter for antineoplastic radiation therapy: Secondary | ICD-10-CM | POA: Diagnosis not present

## 2022-05-20 LAB — RAD ONC ARIA SESSION SUMMARY
Course Elapsed Days: 35
Plan Fractions Treated to Date: 24
Plan Prescribed Dose Per Fraction: 1.8 Gy
Plan Total Fractions Prescribed: 28
Plan Total Prescribed Dose: 50.4 Gy
Reference Point Dosage Given to Date: 43.2 Gy
Reference Point Session Dosage Given: 1.8 Gy
Session Number: 24

## 2022-05-21 ENCOUNTER — Ambulatory Visit: Payer: PPO

## 2022-05-22 ENCOUNTER — Ambulatory Visit: Payer: PPO

## 2022-05-23 ENCOUNTER — Ambulatory Visit: Payer: PPO

## 2022-05-23 ENCOUNTER — Ambulatory Visit
Admission: RE | Admit: 2022-05-23 | Discharge: 2022-05-23 | Disposition: A | Discharge status: Home / Self Care | Payer: PPO | Source: Ambulatory Visit | Attending: Radiation Oncology | Admitting: Radiation Oncology

## 2022-05-23 ENCOUNTER — Other Ambulatory Visit: Payer: Self-pay

## 2022-05-23 DIAGNOSIS — Z510 Encounter for antineoplastic radiation therapy: Secondary | ICD-10-CM | POA: Diagnosis not present

## 2022-05-23 DIAGNOSIS — I10 Essential (primary) hypertension: Secondary | ICD-10-CM

## 2022-05-23 DIAGNOSIS — E1165 Type 2 diabetes mellitus with hyperglycemia: Secondary | ICD-10-CM

## 2022-05-23 LAB — RAD ONC ARIA SESSION SUMMARY
Course Elapsed Days: 38
Plan Fractions Treated to Date: 25
Plan Prescribed Dose Per Fraction: 1.8 Gy
Plan Total Fractions Prescribed: 28
Plan Total Prescribed Dose: 50.4 Gy
Reference Point Dosage Given to Date: 45 Gy
Reference Point Session Dosage Given: 1.8 Gy
Session Number: 25

## 2022-05-26 ENCOUNTER — Ambulatory Visit
Admission: RE | Admit: 2022-05-26 | Discharge: 2022-05-26 | Disposition: A | Discharge status: Home / Self Care | Payer: PPO | Source: Ambulatory Visit | Attending: Radiation Oncology | Admitting: Radiation Oncology

## 2022-05-26 ENCOUNTER — Ambulatory Visit: Payer: PPO

## 2022-05-26 ENCOUNTER — Other Ambulatory Visit: Payer: Self-pay

## 2022-05-26 DIAGNOSIS — C50412 Malignant neoplasm of upper-outer quadrant of left female breast: Secondary | ICD-10-CM | POA: Diagnosis not present

## 2022-05-26 DIAGNOSIS — Z510 Encounter for antineoplastic radiation therapy: Secondary | ICD-10-CM | POA: Insufficient documentation

## 2022-05-26 DIAGNOSIS — Z170 Estrogen receptor positive status [ER+]: Secondary | ICD-10-CM | POA: Insufficient documentation

## 2022-05-26 LAB — RAD ONC ARIA SESSION SUMMARY
Course Elapsed Days: 41
Plan Fractions Treated to Date: 26
Plan Prescribed Dose Per Fraction: 1.8 Gy
Plan Total Fractions Prescribed: 28
Plan Total Prescribed Dose: 50.4 Gy
Reference Point Dosage Given to Date: 46.8 Gy
Reference Point Session Dosage Given: 1.8 Gy
Session Number: 26

## 2022-05-28 ENCOUNTER — Ambulatory Visit
Admission: RE | Admit: 2022-05-28 | Discharge: 2022-05-28 | Disposition: A | Discharge status: Home / Self Care | Payer: PPO | Source: Ambulatory Visit | Attending: Radiation Oncology | Admitting: Radiation Oncology

## 2022-05-28 ENCOUNTER — Other Ambulatory Visit: Payer: Self-pay

## 2022-05-28 ENCOUNTER — Ambulatory Visit: Payer: PPO

## 2022-05-28 DIAGNOSIS — Z510 Encounter for antineoplastic radiation therapy: Secondary | ICD-10-CM | POA: Diagnosis not present

## 2022-05-28 LAB — RAD ONC ARIA SESSION SUMMARY
Course Elapsed Days: 43
Plan Fractions Treated to Date: 27
Plan Prescribed Dose Per Fraction: 1.8 Gy
Plan Total Fractions Prescribed: 28
Plan Total Prescribed Dose: 50.4 Gy
Reference Point Dosage Given to Date: 48.6 Gy
Reference Point Session Dosage Given: 1.8 Gy
Session Number: 27

## 2022-05-29 ENCOUNTER — Ambulatory Visit
Admission: RE | Admit: 2022-05-29 | Discharge: 2022-05-29 | Disposition: A | Discharge status: Home / Self Care | Payer: PPO | Source: Ambulatory Visit | Attending: Radiation Oncology | Admitting: Radiation Oncology

## 2022-05-29 ENCOUNTER — Ambulatory Visit: Payer: PPO

## 2022-05-29 ENCOUNTER — Ambulatory Visit: Payer: PPO | Admitting: Oncology

## 2022-05-29 ENCOUNTER — Other Ambulatory Visit: Payer: Self-pay

## 2022-05-29 DIAGNOSIS — Z510 Encounter for antineoplastic radiation therapy: Secondary | ICD-10-CM | POA: Diagnosis not present

## 2022-05-29 LAB — RAD ONC ARIA SESSION SUMMARY
Course Elapsed Days: 44
Plan Fractions Treated to Date: 28
Plan Prescribed Dose Per Fraction: 1.8 Gy
Plan Total Fractions Prescribed: 28
Plan Total Prescribed Dose: 50.4 Gy
Reference Point Dosage Given to Date: 50.4 Gy
Reference Point Session Dosage Given: 1.8 Gy
Session Number: 28

## 2022-05-30 ENCOUNTER — Other Ambulatory Visit: Payer: Self-pay

## 2022-05-30 ENCOUNTER — Ambulatory Visit: Payer: PPO

## 2022-05-30 ENCOUNTER — Ambulatory Visit
Admission: RE | Admit: 2022-05-30 | Discharge: 2022-05-30 | Disposition: A | Discharge status: Home / Self Care | Payer: PPO | Source: Ambulatory Visit | Attending: Radiation Oncology | Admitting: Radiation Oncology

## 2022-05-30 DIAGNOSIS — Z510 Encounter for antineoplastic radiation therapy: Secondary | ICD-10-CM | POA: Diagnosis not present

## 2022-05-30 LAB — RAD ONC ARIA SESSION SUMMARY
Course Elapsed Days: 45
Plan Fractions Treated to Date: 1
Plan Prescribed Dose Per Fraction: 2 Gy
Plan Total Fractions Prescribed: 5
Plan Total Prescribed Dose: 10 Gy
Reference Point Dosage Given to Date: 52.4 Gy
Reference Point Session Dosage Given: 2 Gy
Session Number: 29

## 2022-06-02 ENCOUNTER — Ambulatory Visit: Payer: PPO

## 2022-06-02 ENCOUNTER — Other Ambulatory Visit: Payer: Self-pay

## 2022-06-02 ENCOUNTER — Ambulatory Visit
Admission: RE | Admit: 2022-06-02 | Discharge: 2022-06-02 | Disposition: A | Discharge status: Home / Self Care | Payer: PPO | Source: Ambulatory Visit | Attending: Radiation Oncology | Admitting: Radiation Oncology

## 2022-06-02 DIAGNOSIS — Z510 Encounter for antineoplastic radiation therapy: Secondary | ICD-10-CM | POA: Diagnosis not present

## 2022-06-02 LAB — RAD ONC ARIA SESSION SUMMARY
Course Elapsed Days: 48
Plan Fractions Treated to Date: 2
Plan Prescribed Dose Per Fraction: 2 Gy
Plan Total Fractions Prescribed: 5
Plan Total Prescribed Dose: 10 Gy
Reference Point Dosage Given to Date: 54.4 Gy
Reference Point Session Dosage Given: 2 Gy
Session Number: 30

## 2022-06-03 ENCOUNTER — Ambulatory Visit: Payer: PPO

## 2022-06-03 ENCOUNTER — Other Ambulatory Visit: Payer: Self-pay

## 2022-06-03 ENCOUNTER — Ambulatory Visit
Admission: RE | Admit: 2022-06-03 | Discharge: 2022-06-03 | Disposition: A | Discharge status: Home / Self Care | Payer: PPO | Source: Ambulatory Visit | Attending: Radiation Oncology | Admitting: Radiation Oncology

## 2022-06-03 DIAGNOSIS — Z510 Encounter for antineoplastic radiation therapy: Secondary | ICD-10-CM | POA: Diagnosis not present

## 2022-06-03 LAB — RAD ONC ARIA SESSION SUMMARY
Course Elapsed Days: 49
Plan Fractions Treated to Date: 3
Plan Prescribed Dose Per Fraction: 2 Gy
Plan Total Fractions Prescribed: 5
Plan Total Prescribed Dose: 10 Gy
Reference Point Dosage Given to Date: 56.4 Gy
Reference Point Session Dosage Given: 2 Gy
Session Number: 31

## 2022-06-04 ENCOUNTER — Encounter: Payer: Self-pay | Admitting: Oncology

## 2022-06-04 ENCOUNTER — Other Ambulatory Visit: Payer: Self-pay

## 2022-06-04 ENCOUNTER — Ambulatory Visit
Admission: RE | Admit: 2022-06-04 | Discharge: 2022-06-04 | Disposition: A | Discharge status: Home / Self Care | Payer: PPO | Source: Ambulatory Visit | Attending: Radiation Oncology | Admitting: Radiation Oncology

## 2022-06-04 ENCOUNTER — Ambulatory Visit: Payer: PPO

## 2022-06-04 DIAGNOSIS — Z510 Encounter for antineoplastic radiation therapy: Secondary | ICD-10-CM | POA: Diagnosis not present

## 2022-06-04 LAB — RAD ONC ARIA SESSION SUMMARY
Course Elapsed Days: 50
Plan Fractions Treated to Date: 4
Plan Prescribed Dose Per Fraction: 2 Gy
Plan Total Fractions Prescribed: 5
Plan Total Prescribed Dose: 10 Gy
Reference Point Dosage Given to Date: 58.4 Gy
Reference Point Session Dosage Given: 2 Gy
Session Number: 32

## 2022-06-05 ENCOUNTER — Other Ambulatory Visit: Payer: Self-pay

## 2022-06-05 ENCOUNTER — Ambulatory Visit
Admission: RE | Admit: 2022-06-05 | Discharge: 2022-06-05 | Disposition: A | Discharge status: Home / Self Care | Payer: PPO | Source: Ambulatory Visit | Attending: Radiation Oncology | Admitting: Radiation Oncology

## 2022-06-05 DIAGNOSIS — Z510 Encounter for antineoplastic radiation therapy: Secondary | ICD-10-CM | POA: Diagnosis not present

## 2022-06-05 LAB — RAD ONC ARIA SESSION SUMMARY
Course Elapsed Days: 51
Plan Fractions Treated to Date: 5
Plan Prescribed Dose Per Fraction: 2 Gy
Plan Total Fractions Prescribed: 5
Plan Total Prescribed Dose: 10 Gy
Reference Point Dosage Given to Date: 60.4 Gy
Reference Point Session Dosage Given: 2 Gy
Session Number: 33

## 2022-06-13 ENCOUNTER — Encounter: Payer: Self-pay | Admitting: Oncology

## 2022-06-13 ENCOUNTER — Ambulatory Visit: Payer: PPO | Admitting: Oncology

## 2022-06-13 ENCOUNTER — Inpatient Hospital Stay: Payer: PPO | Attending: Radiation Oncology | Admitting: Oncology

## 2022-06-13 VITALS — BP 161/84 | HR 76 | Temp 97.8°F | Resp 16 | Wt 139.5 lb

## 2022-06-13 DIAGNOSIS — Z79811 Long term (current) use of aromatase inhibitors: Secondary | ICD-10-CM | POA: Diagnosis not present

## 2022-06-13 DIAGNOSIS — F17210 Nicotine dependence, cigarettes, uncomplicated: Secondary | ICD-10-CM | POA: Insufficient documentation

## 2022-06-13 DIAGNOSIS — I10 Essential (primary) hypertension: Secondary | ICD-10-CM | POA: Insufficient documentation

## 2022-06-13 DIAGNOSIS — Z95828 Presence of other vascular implants and grafts: Secondary | ICD-10-CM | POA: Diagnosis not present

## 2022-06-13 DIAGNOSIS — Z7984 Long term (current) use of oral hypoglycemic drugs: Secondary | ICD-10-CM | POA: Diagnosis not present

## 2022-06-13 DIAGNOSIS — Z8673 Personal history of transient ischemic attack (TIA), and cerebral infarction without residual deficits: Secondary | ICD-10-CM | POA: Insufficient documentation

## 2022-06-13 DIAGNOSIS — I2510 Atherosclerotic heart disease of native coronary artery without angina pectoris: Secondary | ICD-10-CM | POA: Diagnosis not present

## 2022-06-13 DIAGNOSIS — C50412 Malignant neoplasm of upper-outer quadrant of left female breast: Secondary | ICD-10-CM | POA: Diagnosis not present

## 2022-06-13 DIAGNOSIS — Z9221 Personal history of antineoplastic chemotherapy: Secondary | ICD-10-CM | POA: Insufficient documentation

## 2022-06-13 DIAGNOSIS — C50919 Malignant neoplasm of unspecified site of unspecified female breast: Secondary | ICD-10-CM

## 2022-06-13 DIAGNOSIS — E119 Type 2 diabetes mellitus without complications: Secondary | ICD-10-CM | POA: Insufficient documentation

## 2022-06-13 DIAGNOSIS — Z170 Estrogen receptor positive status [ER+]: Secondary | ICD-10-CM | POA: Diagnosis not present

## 2022-06-13 DIAGNOSIS — Z923 Personal history of irradiation: Secondary | ICD-10-CM | POA: Diagnosis not present

## 2022-06-13 DIAGNOSIS — Z79899 Other long term (current) drug therapy: Secondary | ICD-10-CM | POA: Diagnosis not present

## 2022-06-13 DIAGNOSIS — Z7982 Long term (current) use of aspirin: Secondary | ICD-10-CM | POA: Insufficient documentation

## 2022-06-13 MED ORDER — CALCIUM 600 MG PO TABS: 1200.0000 mg | ORAL_TABLET | Freq: Every day | ORAL | 3 refills | Status: AC

## 2022-06-13 MED ORDER — ANASTROZOLE 1 MG PO TABS: 1.0000 mg | ORAL_TABLET | Freq: Every day | ORAL | 2 refills | Status: DC

## 2022-06-13 MED ORDER — LOPERAMIDE HCL 2 MG PO CAPS
2.0000 mg | ORAL_CAPSULE | ORAL | 1 refills | Status: DC
Start: 2022-06-13 — End: 2023-02-24

## 2022-06-13 NOTE — Progress Notes (Signed)
Patient reports occasional diarrhea is stable and only taking Imodium prn depending on what she eats.

## 2022-06-13 NOTE — Progress Notes (Signed)
Hematology/Oncology Progress note [phone] [phone]      Patient Care Team: McLean-Scocuzza, Nino Glow, MD as PCP - General (Internal Medicine) Theodore Demark, RN (Inactive) as Oncology Nurse Navigator  ASSESSMENT & PLAN:   Invasive carcinoma of breast [facility]) Left breast invasive carcinoma, multiple sites.  ER 90%, PR 11-50%, HER2-cT3 N0, Ki-67 was 20%.  neoadjuvant chemotherapy ddAC x 4 followed by weekly Taxol--followed left mastectomy ypT2 pN0- adjuvant RT due to pectoralis muscle abut - Rationale of using aromatase inhibitor -Arimidex  discussed with patient.  Side effects of Arimidex including but not limited to hot flush, joint pain, fatigue, mood swing, osteoporosis discussed with patient. Patient voices understanding and willing to proceed.  -Potential use of abemaciclib as well, we discussed briefly and will evaluate her at at next visit  Aromatase inhibitor use Recommend calcium 1200 mg daily with Vitamin D supplementation Baseline DEXA 2022 - normal  Port-A-Cath in place recommend port flush every 8 weeks Orders Placed This Encounter  Procedures   CBC with Differential/Platelet    Standing Status:   Future    Standing Expiration Date:   06/14/2023   Comprehensive metabolic panel    Standing Status:   Future    Standing Expiration Date:   06/13/2023   Lab MD 2 months flex, cbc cmp All questions were answered. The patient knows to call the clinic with any problems, questions or concerns.  Tammy Server, MD, PhD [facility] Hematology Oncology 06/13/2022   CHIEF COMPLAINTS/REASON FOR VISIT:  multifocal left breast cancer  HISTORY OF PRESENTING ILLNESS:   Tammy Nunez is a  71 y.o.  female presents for follow up of multifocal left breast cancer.  Oncology summary listed below.  Oncology History  Breast cancer in female [facility])  05/08/2021 Initial Diagnosis   Breast cancer in female [facility])   06/03/2021 - 06/03/2021 Chemotherapy          06/21/2021 - 01/21/2022 Chemotherapy   Patient is on Treatment Plan : BREAST ADJUVANT DOSE DENSE AC q14d / PACLitaxel q7d     Invasive carcinoma of breast ([place])  05/02/2021 Mammogram   left breast diagnostic mammogram and ultrasound showed multiple irregular mass in the left breast. Mammogram mass 1 irregular mass associated with distortion in the upper breast at middle depth, multiple adjacent and possible continuous irregular masses, conglomeration of masses measures at least 5.6 cm.  Associated pleomorphic calcifications extending at least 5 x 3.9 x 3.7 cm Mass 2, 1 cm mass in the upper breast at the middle to posterior depth. Mass 3, 5 mm irregular mass in the upper breast at posterior depth. In total, mass 1-mass 3 span at least 8.2 cm in anterior-posterior extent.   By ultrasound  mass 1 12:00 5 cm from nipple, 2.7 x 1.5 x 4.3 cm Mass 2, 12:00 12 cm from nipple, 0.9 x 0.9 x 0.5 cm Mass 3   12:00 14 cm from nipple, 0.4 x 0.4 x 0.4 cm-uses 2.4 cm superior to mass to Mass 4   11:00 No suspicious left axillary adenopathy.   05/07/2021 Initial Diagnosis   Invasive carcinoma of breast  - Ultrasound-guided breast biopsy Breast left 12:00 mass 5 cm from nipple biopsy showed invasive mammary carcinoma, no special type, grade 3, high-grade DCIS present, no LVI, ER 90%, PR 11-50%, HER2 negative by IHC Breast left 11:00 mass 10 cm from nipple biopsy showed invasive mammary carcinoma, no special type, grade 2, no DCIS/LVI identified.ER 90%, PR 11-50%, HER2 negative by IHC  05/16/2021 Cancer Staging   Staging form: Breast, AJCC 8th Edition - Clinical stage from 05/16/2021: Stage IIB (cT3, cN0, cM0, G3, ER+, PR+, HER2-) - Signed by Tammy Server, MD on 06/03/2021 Stage prefix: Initial diagnosis Histologic grading system: 3 grade system   05/22/2021 Imaging   MRI breast showed that nonmass like malignancy spanning over 8 cm, abuting anterior aspect of pectoralis muscle. Discussed with Dr.Cintron and we  agree that she will need mastectomy. There is concern of possible positive margin. I recommend neoadjuvant chemotherapy   06/06/2021 Miscellaneous   Medi port was placed by Dr.Cintron   06/13/2021 Echocardiogram   pre chemotherapy Echo. LVEF 60-65%.  Normal global longitudinal strain. Grade 1 diastolic dysfunction   8/41/6606 - 10/25/2021 Chemotherapy   ddAC x 4 with GCSF followed by weekly Taxol x 12 Chemotherapy break due to side effects, UTI and the patient's preference.   02/17/2022 Surgery   Patient underwent left simple mastectomy, and a sentinel lymph node biopsy.  Pathology showed invasive mammary carcinoma, high-grade DCIS, 4 lymph nodes were sampled and were negative for cancer. ypT2 pN0, grade 3, multifocal invasive carcinoma.  All margins are negative for invasive carcinoma and DCIS. Ki-67 was 20%.   04/29/2022 - 06/03/2022 Radiation Therapy   Due to the initial mass potentially abutting the pectoralis muscle, she received adjuvant chest wall radiation     INTERVAL HISTORY Tammy Nunez is a 71 y.o. female who has above history reviewed by me today presents for follow up visit for  multifocal left breast cancer  She finished Radiation. Tolerates well. Occationally she has loose bowel movements, no abdominal pain. Imodium helps and she requests refill. Appetite is good. She has gained weight   Review of Systems  Constitutional:  Positive for fatigue. Negative for appetite change, chills and fever.  HENT:   Negative for hearing loss and voice change.   Eyes:  Negative for eye problems.  Respiratory:  Negative for chest tightness and cough.   Cardiovascular:  Negative for chest pain.  Gastrointestinal:  Negative for abdominal distention, abdominal pain, blood in stool, diarrhea and nausea.  Endocrine: Negative for hot flashes.  Genitourinary:  Negative for difficulty urinating, dysuria and frequency.   Musculoskeletal:  Negative for arthralgias.  Skin:  Negative for itching  and rash.  Neurological:  Negative for extremity weakness.  Hematological:  Negative for adenopathy. Does not bruise/bleed easily.  Psychiatric/Behavioral:  Negative for confusion.     MEDICAL HISTORY:  Past Medical History:  Diagnosis Date   Anemia    Breast cancer ([place]) 02/17/2022   Breast cancer in female [facility]) 05/08/2021   Chronic left shoulder pain    Coronary artery disease    x 1 stent   Diabetes mellitus without complication ([place])    Dysrhythmia    Hypertension    Hypomagnesemia 09/13/2021   Paralysis ([place])    weakness left upper amd lower extremity   PONV (postoperative nausea and vomiting)    Stroke [facility])     SURGICAL HISTORY: Past Surgical History:  Procedure Laterality Date   BREAST BIOPSY Left 05/07/2021   12:00 5cmfn vision clip path pending   BREAST BIOPSY Left 05/07/2021   11:00 10 cmfn mini cork clip path pending   CAROTID PTA/STENT INTERVENTION Left 07/20/2019   Procedure: CAROTID PTA/STENT INTERVENTION;  Surgeon: Katha Cabal, MD;  Location: [place] [facility];  Service: Cardiovascular;  Laterality: Left;   ECTOPIC PREGNANCY SURGERY     PORTACATH PLACEMENT N/A 06/06/2021   Procedure: INSERTION  PORT-A-CATH;  Surgeon: Herbert Pun, MD;  Location: [facility] ORS;  Service: General;  Laterality: N/A;   SENTINEL NODE BIOPSY Left 02/17/2022   Procedure: SENTINEL NODE BIOPSY;  Surgeon: Herbert Pun, MD;  Location: [facility] ORS;  Service: General;  Laterality: Left;   TOTAL MASTECTOMY Left 02/17/2022   Procedure: TOTAL MASTECTOMY;  Surgeon: Herbert Pun, MD;  Location: [facility] ORS;  Service: General;  Laterality: Left;    SOCIAL HISTORY: Social History   Socioeconomic History   Marital status: Married    Spouse name: Izell Colusa   Number of children: Not on file   Years of education: Not on file   Highest education level: Not on file  Occupational History   Not on file  Tobacco Use   Smoking status: Former    Packs/day: 0.50    Years:  15.00    Total pack years: 7.50    Types: Cigarettes    Quit date: 04/11/2019    Years since quitting: 3.1   Smokeless tobacco: Never  Vaping Use   Vaping Use: Never used  Substance and Sexual Activity   Alcohol use: Never   Drug use: Never   Sexual activity: Not Currently  Other Topics Concern   Not on file  Social History Narrative   No kids    Married with husband    Used to work [employer] broadcast assistant    Social Determinants of Health   Financial Resource Strain: Low Risk  (11/28/2021)   Overall Financial Resource Strain (CARDIA)    Difficulty of Paying Living Expenses: Not hard at all  Food Insecurity: No Food Insecurity (02/05/2022)   Hunger Vital Sign    Worried About Running Out of Food in the Last Year: Never true    Ran Out of Food in the Last Year: Never true  Transportation Needs: No Transportation Needs (02/05/2022)   PRAPARE - Hydrologist (Medical): No    Lack of Transportation (Non-Medical): No  Physical Activity: Sufficiently Active (09/13/2020)   Exercise Vital Sign    Days of Exercise per Week: 7 days    Minutes of Exercise per Session: 30 min  Stress: Stress Concern Present (06/09/2019)   [place]    Feeling of Stress : Very much  Social Connections: Socially Integrated (06/09/2019)   Social Connection and Isolation Panel [NHANES]    Frequency of Communication with Friends and Family: Three times a week    Frequency of Social Gatherings with Friends and Family: Once a week    Attends Religious Services: 1 to 4 times per year    Active Member of [employer] or Organizations: Yes    Attends Archivist Meetings: 1 to 4 times per year    Marital Status: Married  Human resources officer Violence: Not At Risk (02/05/2022)   Humiliation, Afraid, Rape, and Kick questionnaire    Fear of Current or Ex-Partner: No    Emotionally Abused: No    Physically Abused: No     Sexually Abused: No    FAMILY HISTORY: Family History  Problem Relation Age of Onset   Diabetes type II Mother    Hypertension Father    Heart attack Father    Diabetes type II Brother    Breast cancer Neg Hx     ALLERGIES:  has No Known Allergies.  MEDICATIONS:  Current Outpatient Medications  Medication Sig Dispense Refill   amLODipine (NORVASC) 5 MG tablet Take 1 tablet (5  mg total) by mouth daily. 90 tablet 3   aspirin 81 MG EC tablet Take 1 tablet (81 mg total) by mouth daily. 90 tablet 3   atorvastatin (LIPITOR) 80 MG tablet Take 1 tablet (80 mg total) by mouth daily. 90 tablet 3   blood glucose meter kit and supplies 1 each by Other route 4 (four) times daily. Dispense based on patient and insurance preference. Four times daily as directed. (FOR ICD-10 E10.9, E11.9). 1 each 0   calcium carbonate (OS-CAL) 600 MG tablet Take 2 tablets (1,200 mg total) by mouth daily. 180 tablet 3   carvedilol (COREG) 25 MG tablet Take 1 tablet (25 mg total) by mouth 2 (two) times daily with a meal. 180 tablet 3   clopidogrel (PLAVIX) 75 MG tablet Take 1 tablet (75 mg total) by mouth daily. Further refills [facility] neurology 90 tablet 3   Continuous Blood Gluc Sensor (FREESTYLE LIBRE 2 SENSOR) MISC USE TO CHECK BLOOD SUGAR AT LEAST EVERY 8 HOURS 6 each 0   empagliflozin (JARDIANCE) 25 MG TABS tablet Take 1 tablet (25 mg total) by mouth daily. 90 tablet 3   HYDROcodone-acetaminophen (NORCO/VICODIN) 5-325 MG tablet Take 1 tablet by mouth every 4 (four) hours as needed for moderate pain. 16 tablet 0   lisinopril (ZESTRIL) 40 MG tablet Take 1 tablet (40 mg total) by mouth daily. 90 tablet 3   loratadine (CLARITIN) 10 MG tablet Take 1 tablet (10 mg total) by mouth daily. 90 tablet 3   Multiple Vitamin (MULTIVITAMIN) tablet Take 1 tablet by mouth daily.     potassium chloride SA (KLOR-CON M20) 20 MEQ tablet Take 1 tablet (20 mEq total) by mouth daily. Clarify its 1x per day 90 tablet 3   vitamin B-12  (CYANOCOBALAMIN) 1000 MCG tablet Take 1 tablet (1,000 mcg total) by mouth daily. 30 tablet 1   Vitamin D, Cholecalciferol, 25 MCG (1000 UT) TABS Take 1,000 Units by mouth.     loperamide (IMODIUM) 2 MG capsule Take 1 capsule (2 mg total) by mouth See admin instructions. Take 82m at the onset of diarrhea, and then 275mafter each loose bowel movement, maximum 1635mer 24 hours. 30 capsule 1   No current facility-administered medications for this visit.   Facility-Administered Medications Ordered in Other Visits  Medication Dose Route Frequency Provider Last Rate Last Admin   heparin lock flush 100 UNIT/ML injection              PHYSICAL EXAMINATION: ECOG PERFORMANCE STATUS: 2 - Symptomatic, <50% confined to bed Vitals:   06/13/22 1300  BP: (!) 161/84  Pulse: 76  Resp: 16  Temp: 97.8 F (36.6 C)    Filed Weights   06/13/22 1300  Weight: 139 lb 8 oz (63.3 kg)      Physical Exam Constitutional:      General: She is not in acute distress. HENT:     Head: Normocephalic and atraumatic.  Eyes:     General: No scleral icterus. Cardiovascular:     Rate and Rhythm: Normal rate and regular rhythm.     Heart sounds: Normal heart sounds.  Pulmonary:     Effort: Pulmonary effort is normal. No respiratory distress.     Breath sounds: No wheezing.  Abdominal:     General: Bowel sounds are normal. There is no distension.     Palpations: Abdomen is soft.  Musculoskeletal:        General: No deformity. Normal range of motion.  Cervical back: Normal range of motion and neck supple.  Skin:    General: Skin is warm and dry.     Findings: No erythema or rash.  Neurological:     Mental Status: She is alert and oriented to person, place, and time. Mental status is at baseline.     Comments: Chronic left upper and lower extremity weakness.  Psychiatric:        Mood and Affect: Mood normal.   Left breast mass palpable, similar size.    LABORATORY DATA:  I have reviewed the data as  listed Lab Results  Component Value Date   WBC 4.3 04/29/2022   HGB 12.8 04/29/2022   HCT 38.7 04/29/2022   MCV 96.0 04/29/2022   PLT 153 04/29/2022   Recent Labs    01/21/22 0817 04/14/22 1431 04/29/22 0934  NA 136 140 140  K 3.8 3.9 3.7  CL 105 106 109  CO2 24 22 23   GLUCOSE 108* 133* 102*  BUN 14 16 17   CREATININE 0.72 0.85 0.70  CALCIUM 9.3 9.6 9.0  GFRNONAA >60 >60 >60  PROT 6.3* 6.8 6.7  ALBUMIN 3.9 4.2 4.1  AST 23 33 26  ALT 30 30 34  ALKPHOS 37* 45 41  BILITOT 0.2* 0.3 0.5    Iron/TIBC/Ferritin/ %Sat    Component Value Date/Time   IRON 113 12/08/2019 1015   TIBC 317 12/08/2019 1015   FERRITIN 67 12/08/2019 1015   IRONPCTSAT 36 12/08/2019 1015       RADIOGRAPHIC STUDIES: I have personally reviewed the radiological images as listed and agreed with the findings in the report. No results found.

## 2022-06-13 NOTE — Assessment & Plan Note (Addendum)
Recommend calcium 1200 mg daily with Vitamin D supplementation Baseline DEXA 2022 - normal

## 2022-06-13 NOTE — Assessment & Plan Note (Signed)
recommend port flush every 8 weeks 

## 2022-06-13 NOTE — Assessment & Plan Note (Addendum)
Left breast invasive carcinoma, multiple sites.  ER 90%, PR 11-50%, HER2-cT3 N0, Ki-67 was 20%.  neoadjuvant chemotherapy ddAC x 4 followed by weekly Taxol--followed left mastectomy ypT2 pN0- adjuvant RT due to pectoralis muscle abut - Rationale of using aromatase inhibitor -Arimidex  discussed with patient.  Side effects of Arimidex including but not limited to hot flush, joint pain, fatigue, mood swing, osteoporosis discussed with patient. Patient voices understanding and willing to proceed.  -Potential use of abemaciclib as well, we discussed briefly and will evaluate her at at next visit

## 2022-06-24 ENCOUNTER — Inpatient Hospital Stay: Payer: PPO | Attending: Radiation Oncology

## 2022-06-24 DIAGNOSIS — C50412 Malignant neoplasm of upper-outer quadrant of left female breast: Secondary | ICD-10-CM | POA: Insufficient documentation

## 2022-06-24 DIAGNOSIS — Z452 Encounter for adjustment and management of vascular access device: Secondary | ICD-10-CM | POA: Diagnosis not present

## 2022-06-24 DIAGNOSIS — Z95828 Presence of other vascular implants and grafts: Secondary | ICD-10-CM

## 2022-06-24 MED ORDER — SODIUM CHLORIDE 0.9% FLUSH
10.0000 mL | Freq: Once | INTRAVENOUS | Status: AC
  Administered 2022-06-24: 10 mL via INTRAVENOUS
  Filled 2022-06-24: qty 10

## 2022-06-24 MED ORDER — HEPARIN SOD (PORK) LOCK FLUSH 100 UNIT/ML IV SOLN
500.0000 [IU] | Freq: Once | INTRAVENOUS | Status: AC
  Administered 2022-06-24: 500 [IU] via INTRAVENOUS
  Filled 2022-06-24: qty 5

## 2022-07-07 ENCOUNTER — Ambulatory Visit
Admission: RE | Admit: 2022-07-07 | Discharge: 2022-07-07 | Disposition: A | Discharge status: Home / Self Care | Payer: PPO | Source: Ambulatory Visit | Attending: Radiation Oncology | Admitting: Radiation Oncology

## 2022-07-07 VITALS — BP 145/74 | HR 83 | Temp 96.8°F | Resp 16 | Ht 60.0 in | Wt 143.6 lb

## 2022-07-07 DIAGNOSIS — Z170 Estrogen receptor positive status [ER+]: Secondary | ICD-10-CM | POA: Insufficient documentation

## 2022-07-07 DIAGNOSIS — Z923 Personal history of irradiation: Secondary | ICD-10-CM | POA: Diagnosis not present

## 2022-07-07 DIAGNOSIS — Z79811 Long term (current) use of aromatase inhibitors: Secondary | ICD-10-CM | POA: Diagnosis not present

## 2022-07-07 DIAGNOSIS — C50412 Malignant neoplasm of upper-outer quadrant of left female breast: Secondary | ICD-10-CM | POA: Insufficient documentation

## 2022-07-07 DIAGNOSIS — C50919 Malignant neoplasm of unspecified site of unspecified female breast: Secondary | ICD-10-CM

## 2022-07-07 NOTE — Progress Notes (Signed)
Radiation Oncology Follow up Note  Name: Tammy Nunez   Date:   07/07/2022 MRN:  [id number] DOB: 04-Feb-1951    This 71 y.o. female presents to the clinic today for 1 month follow-up status post left chest wall and peripheral lymphatic radiation status post neoadjuvant chemotherapy for a pT2 N0 M0 grade 3 invasive mammary carcinoma encroaching on the pectoralis muscle.  REFERRING PROVIDER: McLean-Scocuzza, Olivia Mackie *  HPI: Patient is a 71 year old female now at 1 month having completed left chest wall and peripheral lymphatic radiation therapy based on initial stage IIIb (T4 N0 M0) invasive mammary carcinoma with tumor encroaching on the pectoralis muscle.  He was downstage to a T2 lesion after neoadjuvant chemotherapy.  Seen today in routine follow-up she is doing well she specifically denies chest wall tenderness any swelling in her left upper extremity cough or bone pain..  She has been started on Arimidex tolerating it well without side effect.  COMPLICATIONS OF TREATMENT: none  FOLLOW UP COMPLIANCE: keeps appointments   PHYSICAL EXAM:  BP (!) 145/74   Pulse 83   Temp (!) 96.8 F (36 C)   Resp 16   Ht 5' (1.524 m)   Wt 143 lb 9.6 oz (65.1 kg)   BMI 28.04 kg/m  She is status post left modified radical mastectomy there is hyperpigmentation left chest wall no evidence of mass or nodularity is noted.  No lymphedema is noted in left upper extremity right breast is free of dominant mass.  Well-developed well-nourished patient in NAD. HEENT reveals PERLA, EOMI, discs not visualized.  Oral cavity is clear. No oral mucosal lesions are identified. Neck is clear without evidence of cervical or supraclavicular adenopathy. Lungs are clear to A&P. Cardiac examination is essentially unremarkable with regular rate and rhythm without murmur rub or thrill. Abdomen is benign with no organomegaly or masses noted. Motor sensory and DTR levels are equal and symmetric in the upper and lower extremities.  Cranial nerves II through XII are grossly intact. Proprioception is intact. No peripheral adenopathy or edema is identified. No motor or sensory levels are noted. Crude visual fields are within normal range.  RADIOLOGY RESULTS: No current films for review  PLAN: Present time patient is doing well 1 month out from chest wall radiation.  And pleased with her overall progress.  Of asked to see her back in 4 to 5 months for follow-up.  She continues follow-up care with medical oncology.  Patient is to call with any concerns.  I would like to take this opportunity to thank you for allowing me to participate in the care of your patient.Noreene Filbert, MD

## 2022-07-22 ENCOUNTER — Other Ambulatory Visit: Payer: Self-pay | Admitting: Internal Medicine

## 2022-07-22 DIAGNOSIS — E1165 Type 2 diabetes mellitus with hyperglycemia: Secondary | ICD-10-CM

## 2022-08-05 ENCOUNTER — Ambulatory Visit (INDEPENDENT_AMBULATORY_CARE_PROVIDER_SITE_OTHER): Payer: PPO | Admitting: Internal Medicine

## 2022-08-05 ENCOUNTER — Encounter: Payer: Self-pay | Admitting: Internal Medicine

## 2022-08-05 VITALS — BP 118/68 | HR 89 | Temp 98.5°F | Ht 60.0 in | Wt 151.2 lb

## 2022-08-05 DIAGNOSIS — I10 Essential (primary) hypertension: Secondary | ICD-10-CM

## 2022-08-05 DIAGNOSIS — E876 Hypokalemia: Secondary | ICD-10-CM

## 2022-08-05 DIAGNOSIS — Z8673 Personal history of transient ischemic attack (TIA), and cerebral infarction without residual deficits: Secondary | ICD-10-CM | POA: Diagnosis not present

## 2022-08-05 DIAGNOSIS — E785 Hyperlipidemia, unspecified: Secondary | ICD-10-CM

## 2022-08-05 DIAGNOSIS — E1165 Type 2 diabetes mellitus with hyperglycemia: Secondary | ICD-10-CM

## 2022-08-05 DIAGNOSIS — I152 Hypertension secondary to endocrine disorders: Secondary | ICD-10-CM

## 2022-08-05 DIAGNOSIS — I639 Cerebral infarction, unspecified: Secondary | ICD-10-CM

## 2022-08-05 DIAGNOSIS — E1159 Type 2 diabetes mellitus with other circulatory complications: Secondary | ICD-10-CM | POA: Diagnosis not present

## 2022-08-05 LAB — POCT GLYCOSYLATED HEMOGLOBIN (HGB A1C)
HbA1c POC (<> result, manual entry): 6.2 % (ref 4.0–5.6)
HbA1c, POC (controlled diabetic range): 6.2 % (ref 0.0–7.0)
HbA1c, POC (prediabetic range): 6.2 % (ref 5.7–6.4)
Hemoglobin A1C: 6.2 % — AB (ref 4.0–5.6)

## 2022-08-05 MED ORDER — ATORVASTATIN CALCIUM 80 MG PO TABS: 80.0000 mg | ORAL_TABLET | Freq: Every day | ORAL | 3 refills | Status: DC

## 2022-08-05 MED ORDER — AMLODIPINE BESYLATE 5 MG PO TABS: 5.0000 mg | ORAL_TABLET | Freq: Every day | ORAL | 3 refills | Status: DC

## 2022-08-05 MED ORDER — CLOPIDOGREL BISULFATE 75 MG PO TABS: 75.0000 mg | ORAL_TABLET | Freq: Every day | ORAL | 3 refills | Status: DC

## 2022-08-05 MED ORDER — EMPAGLIFLOZIN 25 MG PO TABS: 25.0000 mg | ORAL_TABLET | Freq: Every day | ORAL | 3 refills | Status: DC

## 2022-08-05 MED ORDER — POTASSIUM CHLORIDE CRYS ER 20 MEQ PO TBCR: 20.0000 meq | EXTENDED_RELEASE_TABLET | Freq: Every day | ORAL | 3 refills | Status: DC

## 2022-08-05 MED ORDER — LISINOPRIL 40 MG PO TABS
40.0000 mg | ORAL_TABLET | Freq: Every day | ORAL | 3 refills | Status: DC
Start: 2022-08-05 — End: 2023-05-26

## 2022-08-05 MED ORDER — CARVEDILOL 25 MG PO TABS: 25.0000 mg | ORAL_TABLET | Freq: Two times a day (BID) | ORAL | 3 refills | Status: DC

## 2022-08-05 NOTE — Patient Instructions (Addendum)
Dr. Volanda Napoleon new PCP  Think about colonoscopy in the future and prevnar 20 (pneumonia shot)   Colonoscopy, Adult A colonoscopy is a procedure to look at the entire large intestine. This procedure is done using a long, thin, flexible tube that has a camera on the end. You may have a colonoscopy: As a part of normal colorectal screening. If you have certain symptoms, such as: A low number of red blood cells in your blood (anemia). Diarrhea that does not go away. Pain in your abdomen. Blood in your stool. A colonoscopy can help screen for and diagnose medical problems, including: An abnormal growth of cells or tissue (tumor). Abnormal growths within the lining of your intestine (polyps). Inflammation. Areas of bleeding. Tell your health care provider about: Any allergies you have. All medicines you are taking, including vitamins, herbs, eye drops, creams, and over-the-counter medicines. Any problems you or family members have had with anesthetic medicines. Any bleeding problems you have. Any surgeries you have had. Any medical conditions you have. Any problems you have had with having bowel movements. Whether you are pregnant or may be pregnant. What are the risks? Generally, this is a safe procedure. However, problems may occur, including: Bleeding. Damage to your intestine. Allergic reactions to medicines given during the procedure. Infection. This is rare. What happens before the procedure? Eating and drinking restrictions Follow instructions from your health care provider about eating or drinking restrictions, which may include: A few days before the procedure: Follow a low-fiber diet. Avoid nuts, seeds, dried fruit, raw fruits, and vegetables. 1-3 days before the procedure: Eat only gelatin dessert or ice pops. Drink only clear liquids, such as water, clear juice, clear broth or bouillon, black coffee or tea, or clear soft drinks or sports drinks. Avoid liquids that contain  red or purple dye. The day of the procedure: Do not eat solid foods. You may continue to drink clear liquids until up to 2 hours before the procedure. Do not eat or drink anything starting 2 hours before the procedure, or within the time period that your health care provider recommends. Bowel prep If you were prescribed a bowel prep to take by mouth (orally) to clean out your colon: Take it as told by your health care provider. Starting the day before your procedure, you will need to drink a large amount of liquid medicine. The liquid will cause you to have many bowel movements of loose stool until your stool becomes almost clear or light green. If your skin or the opening between the buttocks (anus) gets irritated from diarrhea, you may relieve the irritation using: Wipes with medicine in them, such as adult wet wipes with aloe and vitamin E. A product to soothe skin, such as petroleum jelly. If you vomit while drinking the bowel prep: Take a break for up to 60 minutes. Begin the bowel prep again. Call your health care provider if you keep vomiting or you cannot take the bowel prep without vomiting. To clean out your colon, you may also be given: Laxative medicines. These help you have a bowel movement. Instructions for enema use. An enema is liquid medicine injected into your rectum. Medicines Ask your health care provider about: Changing or stopping your regular medicines or supplements. This is especially important if you are taking iron supplements, diabetes medicines, or blood thinners. Taking medicines such as aspirin and ibuprofen. These medicines can thin your blood. Do not take these medicines unless your health care provider tells you to take them.  Taking over-the-counter medicines, vitamins, herbs, and supplements. General instructions Ask your health care provider what steps will be taken to help prevent infection. These may include washing skin with a germ-killing soap. If you  will be going home right after the procedure, plan to have a responsible adult: Take you home from the hospital or clinic. You will not be allowed to drive. Care for you for the time you are told. What happens during the procedure?  An IV will be inserted into one of your veins. You will be given a medicine to make you fall asleep (general anesthetic). You will lie on your side with your knees bent. A lubricant will be put on the tube. Then the tube will be: Inserted into your anus. Gently eased through all parts of your large intestine. Air will be sent into your colon to keep it open. This may cause some pressure or cramping. Images will be taken with the camera and will appear on a screen. A small tissue sample may be removed to be looked at under a microscope (biopsy). The tissue may be sent to a lab for testing if any signs of problems are found. If small polyps are found, they may be removed and checked for cancer cells. When the procedure is finished, the tube will be removed. The procedure may vary among health care providers and hospitals. What happens after the procedure? Your blood pressure, heart rate, breathing rate, and blood oxygen level will be monitored until you leave the hospital or clinic. You may have a small amount of blood in your stool. You may pass gas and have mild cramping or bloating in your abdomen. This is caused by the air that was used to open your colon during the exam. If you were given a sedative during the procedure, it can affect you for several hours. Do not drive or operate machinery until your health care provider says that it is safe. It is up to you to get the results of your procedure. Ask your health care provider, or the department that is doing the procedure, when your results will be ready. Summary A colonoscopy is a procedure to look at the entire large intestine. Follow instructions from your health care provider about eating and drinking  before the procedure. If you were prescribed an oral bowel prep to clean out your colon, take it as told by your health care provider. During the colonoscopy, a flexible tube with a camera on its end is inserted into the anus and then passed into all parts of the large intestine. This information is not intended to replace advice given to you by your health care provider. Make sure you discuss any questions you have with your health care provider. Document Revised: 11/04/2021 Document Reviewed: 07/03/2021 [employer] Patient Education  [employer].

## 2022-08-05 NOTE — Progress Notes (Signed)
Chief Complaint  Patient presents with   Follow-up    5 month f/u    F/u  1. Htn and dm 2 A1c 6.2 on jardiance 25 norvasc 5, coreg 25 bid, lipitor 80 with h/o stroke bp at goal  2. H/o breast cancer she just finished 5 weeks of radiation and is feeling well appetite good   Review of Systems  Constitutional:  Negative for weight loss.  HENT:  Negative for hearing loss.   Eyes:  Negative for blurred vision.  Respiratory:  Negative for shortness of breath.   Cardiovascular:  Negative for chest pain.  Gastrointestinal:  Negative for abdominal pain and blood in stool.  Genitourinary:  Negative for dysuria.  Musculoskeletal:  Negative for falls and joint pain.  Skin:  Negative for rash.  Neurological:  Negative for headaches.  Psychiatric/Behavioral:  Negative for depression.    Past Medical History:  Diagnosis Date   Anemia    Breast cancer ([place]) 02/17/2022   Breast cancer in female [facility]) 05/08/2021   Chronic left shoulder pain    Coronary artery disease    x 1 stent   Diabetes mellitus without complication ([place])    Dysrhythmia    Hypertension    Hypomagnesemia 09/13/2021   Paralysis ([place])    weakness left upper amd lower extremity   PONV (postoperative nausea and vomiting)    Stroke [facility])    Past Surgical History:  Procedure Laterality Date   BREAST BIOPSY Left 05/07/2021   12:00 5cmfn vision clip path pending   BREAST BIOPSY Left 05/07/2021   11:00 10 cmfn mini cork clip path pending   CAROTID PTA/STENT INTERVENTION Left 07/20/2019   Procedure: CAROTID PTA/STENT INTERVENTION;  Surgeon: Katha Cabal, MD;  Location: [place] [facility];  Service: Cardiovascular;  Laterality: Left;   ECTOPIC PREGNANCY SURGERY     PORTACATH PLACEMENT N/A 06/06/2021   Procedure: INSERTION PORT-A-CATH;  Surgeon: Herbert Pun, MD;  Location: [facility] ORS;  Service: General;  Laterality: N/A;   SENTINEL NODE BIOPSY Left 02/17/2022   Procedure: SENTINEL NODE BIOPSY;  Surgeon:  Herbert Pun, MD;  Location: [facility] OR[facility];  Service: General;  Laterality: Left;   TOTAL MASTECTOMY Left 02/17/2022   Procedure: TOTAL MASTECTOMY;  Surgeon: Herbert Pun, MD;  Location: [facility] OR[facility];  Service: General;  Laterality: Left;   Family History  Problem Relation Age of Onset   Diabetes type II Mother    Hypertension Father    Heart attack Father    Diabetes type II Brother    Breast cancer Neg Hx    Social History   Socioeconomic History   Marital status: Married    Spouse name: Izell [place]   Number of children: Not on file   Years of education: Not on file   Highest education level: Not on file  Occupational History   Not on file  Tobacco Use   Smoking status: Former    Packs/day: 0.50    Years: 15.00    Total pack years: 7.50    Types: Cigarettes    Quit date: 04/11/2019    Years since quitting: 3.3   Smokeless tobacco: Never  Vaping Use   Vaping Use: Never used  Substance and Sexual Activity   Alcohol use: Never   Drug use: Never   Sexual activity: Not Currently  Other Topics Concern   Not on file  Social History Narrative   No kids    Married with husband    Used to work Metallurgist  worker    Social Determinants of Health   Financial Resource Strain: Low Risk  (11/28/2021)   Overall Financial Resource Strain (CARDIA)    Difficulty of Paying Living Expenses: Not hard at all  Food Insecurity: No Food Insecurity (02/05/2022)   Hunger Vital Sign    Worried About Running Out of Food in the Last Year: Never true    [place] in the Last Year: Never true  Transportation Needs: No Transportation Needs (02/05/2022)   PRAPARE - Hydrologist (Medical): No    Lack of Transportation (Non-Medical): No  Physical Activity: Sufficiently Active (09/13/2020)   Exercise Vital Sign    Days of Exercise per Week: 7 days    Minutes of Exercise per Session: 30 min  Stress: Stress Concern Present (06/09/2019)   [place]    Feeling of Stress : Very much  Social Connections: Socially Integrated (06/09/2019)   Social Connection and Isolation Panel [NHANES]    Frequency of Communication with Friends and Family: Three times a week    Frequency of Social Gatherings with Friends and Family: Once a week    Attends Religious Services: 1 to 4 times per year    Active Member of [employer] or Organizations: Yes    Attends Archivist Meetings: 1 to 4 times per year    Marital Status: Married  Human resources officer Violence: Not At Risk (02/05/2022)   Humiliation, Afraid, Rape, and Kick questionnaire    Fear of Current or Ex-Partner: No    Emotionally Abused: No    Physically Abused: No    Sexually Abused: No   Current Meds  Medication Sig   anastrozole (ARIMIDEX) 1 MG tablet Take 1 tablet (1 mg total) by mouth daily.   aspirin 81 MG EC tablet Take 1 tablet (81 mg total) by mouth daily.   blood glucose meter kit and supplies 1 each by Other route 4 (four) times daily. Dispense based on patient and insurance preference. Four times daily as directed. (FOR ICD-10 E10.9, E11.9).   calcium carbonate (OS-CAL) 600 MG tablet Take 2 tablets (1,200 mg total) by mouth daily.   Continuous Blood Gluc Sensor (FREESTYLE LIBRE 2 SENSOR) MISC USE TO CHECK BLOOD SUGAR AT LEAST EVERY 8 HOURS   HYDROcodone-acetaminophen (NORCO/VICODIN) 5-325 MG tablet Take 1 tablet by mouth every 4 (four) hours as needed for moderate pain.   loperamide (IMODIUM) 2 MG capsule Take 1 capsule (2 mg total) by mouth See admin instructions. Take 36m at the onset of diarrhea, and then 225mafter each loose bowel movement, maximum 1658mer 24 hours.   loratadine (CLARITIN) 10 MG tablet Take 1 tablet (10 mg total) by mouth daily.   Multiple Vitamin (MULTIVITAMIN) tablet Take 1 tablet by mouth daily.   vitamin B-12 (CYANOCOBALAMIN) 1000 MCG tablet Take 1 tablet (1,000 mcg total) by mouth daily.    Vitamin D, Cholecalciferol, 25 MCG (1000 UT) TABS Take 1,000 Units by mouth.   [DISCONTINUED] amLODipine (NORVASC) 5 MG tablet Take 1 tablet (5 mg total) by mouth daily.   [DISCONTINUED] atorvastatin (LIPITOR) 80 MG tablet Take 1 tablet (80 mg total) by mouth daily.   [DISCONTINUED] carvedilol (COREG) 25 MG tablet Take 1 tablet (25 mg total) by mouth 2 (two) times daily with a meal.   [DISCONTINUED] clopidogrel (PLAVIX) 75 MG tablet Take 1 tablet (75 mg total) by mouth daily. Further refills [facility]   [DISCONTINUED]  empagliflozin (JARDIANCE) 25 MG TABS tablet Take 1 tablet (25 mg total) by mouth daily.   [DISCONTINUED] lisinopril (ZESTRIL) 40 MG tablet Take 1 tablet (40 mg total) by mouth daily.   [DISCONTINUED] potassium chloride SA (KLOR-CON M20) 20 MEQ tablet Take 1 tablet (20 mEq total) by mouth daily. Clarify its 1x per day   No Known Allergies Recent Results (from the past 2160 hour(s))  Rad Onc Aria Session Summary     Status: None   Collection Time: 05/07/22  9:24 AM  Result Value Ref Range   Course ID [email or web address]    Course Intent Curative    Course Start Date 03/12/2022 11:27 AM    Session Number 15    Course First Treatment Date 04/15/2022  9:14 AM    Course Last Treatment Date 05/07/2022  9:23 AM    Course Elapsed Days 22    Reference Point ID [email or web address]    Reference Point Dosage Given to Date 27.00000015 Gy   Reference Point Session Dosage Given 1.80000001 Gy   Plan ID CW_L    Plan Fractions Treated to Date 15    Plan Total Fractions Prescribed 28    Plan Prescribed Dose Per Fraction 1.8 Gy   Plan Total Prescribed Dose 50.400000 Gy   Plan Primary Reference Point [email or web address]   Rad Onc Aria Session Summary     Status: None   Collection Time: 05/08/22  9:21 AM  Result Value Ref Range   Course ID [email or web address]    Course Intent Curative    Course Start Date 03/12/2022 11:27 AM    Session Number 16    Course First Treatment Date 04/15/2022  9:14 AM    Course Last Treatment Date 05/08/2022  9:20 AM     Course Elapsed Days 23    Reference Point ID [email or web address]    Reference Point Dosage Given to Date 14.48185631 Gy   Reference Point Session Dosage Given 1.80000001 Gy   Plan ID CW_L    Plan Fractions Treated to Date 16    Plan Total Fractions Prescribed 28    Plan Prescribed Dose Per Fraction 1.8 Gy   Plan Total Prescribed Dose 50.400000 Gy   Plan Primary Reference Point CW_L2   Rad Onc Aria Session Summary     Status: None   Collection Time: 05/09/22  9:10 AM  Result Value Ref Range   Course ID [email or web address]    Course Intent Curative    Course Start Date 03/12/2022 11:27 AM    Session Number 17    Course First Treatment Date 04/15/2022  9:14 AM    Course Last Treatment Date 05/09/2022  9:09 AM    Course Elapsed Days 24    Reference Point ID [email or web address]    Reference Point Dosage Given to Date 30.60000017 Gy   Reference Point Session Dosage Given 1.80000001 Gy   Plan ID CW_L    Plan Fractions Treated to Date 17    Plan Total Fractions Prescribed 28    Plan Prescribed Dose Per Fraction 1.8 Gy   Plan Total Prescribed Dose 50.400000 Gy   Plan Primary Reference Point CW_L2   Rad Onc Aria Session Summary     Status: None   Collection Time: 05/12/22  8:54 AM  Result Value Ref Range   Course ID [email or web address]    Course Intent Curative    Course Start Date 03/12/2022 11:27 AM    Session Number 18    Course First Treatment Date 04/15/2022  9:14 AM  Course Last Treatment Date 05/12/2022  8:53 AM    Course Elapsed Days 27    Reference Point ID [email or web address]    Reference Point Dosage Given to Date 32.40000018 Gy   Reference Point Session Dosage Given 1.80000001 Gy   Plan ID CW_L    Plan Fractions Treated to Date 18    Plan Total Fractions Prescribed 28    Plan Prescribed Dose Per Fraction 1.8 Gy   Plan Total Prescribed Dose 50.400000 Gy   Plan Primary Reference Point CW_L2   Rad Onc Aria Session Summary     Status: None   Collection Time: 05/13/22  9:05 AM  Result Value Ref Range   Course ID [email or web address]    Course Intent  Curative    Course Start Date 03/12/2022 11:27 AM    Session Number 19    Course First Treatment Date 04/15/2022  9:14 AM    Course Last Treatment Date 05/13/2022  9:04 AM    Course Elapsed Days 28    Reference Point ID [email or web address]    Reference Point Dosage Given to Date 34.20000019 Gy   Reference Point Session Dosage Given 1.80000001 Gy   Plan ID CW_L    Plan Fractions Treated to Date 19    Plan Total Fractions Prescribed 28    Plan Prescribed Dose Per Fraction 1.8 Gy   Plan Total Prescribed Dose 50.400000 Gy   Plan Primary Reference Point CW_L2   Rad Onc Aria Session Summary     Status: None   Collection Time: 05/14/22  9:27 AM  Result Value Ref Range   Course ID [email or web address]    Course Intent Curative    Course Start Date 03/12/2022 11:27 AM    Session Number 20    Course First Treatment Date 04/15/2022  9:14 AM    Course Last Treatment Date 05/14/2022  9:26 AM    Course Elapsed Days 29    Reference Point ID [email or web address]    Reference Point Dosage Given to Date 21.2248250 Gy   Reference Point Session Dosage Given 1.80000001 Gy   Plan ID CW_L    Plan Fractions Treated to Date 20    Plan Total Fractions Prescribed 28    Plan Prescribed Dose Per Fraction 1.8 Gy   Plan Total Prescribed Dose 50.400000 Gy   Plan Primary Reference Point CW_L2   Rad Onc Aria Session Summary     Status: None   Collection Time: 05/15/22  8:30 AM  Result Value Ref Range   Course ID [email or web address]    Course Intent Curative    Course Start Date 03/12/2022 11:27 AM    Session Number 21    Course First Treatment Date 04/15/2022  9:14 AM    Course Last Treatment Date 05/15/2022  8:29 AM    Course Elapsed Days 30    Reference Point ID [email or web address]    Reference Point Dosage Given to Date 03.70488891 Gy   Reference Point Session Dosage Given 1.80000001 Gy   Plan ID CW_L    Plan Fractions Treated to Date 21    Plan Total Fractions Prescribed 28    Plan Prescribed Dose Per Fraction 1.8 Gy   Plan Total Prescribed Dose 50.400000 Gy   Plan Primary  Reference Point CW_L2   Rad Onc Aria Session Summary     Status: None   Collection Time: 05/16/22  9:01 AM  Result Value Ref Range   Course ID [email or web address]    Course Intent Curative    Course Start Date 03/12/2022  11:27 AM    Session Number 22    Course First Treatment Date 04/15/2022  9:14 AM    Course Last Treatment Date 05/16/2022  9:00 AM    Course Elapsed Days 31    Reference Point ID [email or web address]    Reference Point Dosage Given to Date 39.60000022 Gy   Reference Point Session Dosage Given 1.80000001 Gy   Plan ID CW_L    Plan Fractions Treated to Date 22    Plan Total Fractions Prescribed 28    Plan Prescribed Dose Per Fraction 1.8 Gy   Plan Total Prescribed Dose 50.400000 Gy   Plan Primary Reference Point CW_L2   Rad Onc Aria Session Summary     Status: None   Collection Time: 05/19/22  9:07 AM  Result Value Ref Range   Course ID [email or web address]    Course Intent Curative    Course Start Date 03/12/2022 11:27 AM    Session Number 23    Course First Treatment Date 04/15/2022  9:14 AM    Course Last Treatment Date 05/19/2022  9:06 AM    Course Elapsed Days 34    Reference Point ID [email or web address]    Reference Point Dosage Given to Date 41.40000023 Gy   Reference Point Session Dosage Given 1.80000001 Gy   Plan ID CW_L    Plan Fractions Treated to Date 23    Plan Total Fractions Prescribed 28    Plan Prescribed Dose Per Fraction 1.8 Gy   Plan Total Prescribed Dose 50.400000 Gy   Plan Primary Reference Point [email or web address]   Rad Onc Aria Session Summary     Status: None   Collection Time: 05/20/22  9:26 AM  Result Value Ref Range   Course ID [email or web address]    Course Intent Curative    Course Start Date 03/12/2022 11:27 AM    Session Number 24    Course First Treatment Date 04/15/2022  9:14 AM    Course Last Treatment Date 05/20/2022  9:25 AM    Course Elapsed Days 35    Reference Point ID [email or web address]    Reference Point Dosage Given to Date 43.20000024 Gy   Reference Point Session Dosage Given 1.80000001 Gy   Plan ID CW_L    Plan  Fractions Treated to Date 24    Plan Total Fractions Prescribed 28    Plan Prescribed Dose Per Fraction 1.8 Gy   Plan Total Prescribed Dose 50.400000 Gy   Plan Primary Reference Point CW_L2   Rad Onc Aria Session Summary     Status: None   Collection Time: 05/23/22  9:10 AM  Result Value Ref Range   Course ID [email or web address]    Course Intent Curative    Course Start Date 03/12/2022 11:27 AM    Session Number 25    Course First Treatment Date 04/15/2022  9:14 AM    Course Last Treatment Date 05/23/2022  9:09 AM    Course Elapsed Days 38    Reference Point ID [email or web address]    Reference Point Dosage Given to Date 45.00000025 Gy   Reference Point Session Dosage Given 1.80000001 Gy   Plan ID CW_L    Plan Fractions Treated to Date 25    Plan Total Fractions Prescribed 28    Plan Prescribed Dose Per Fraction 1.8 Gy   Plan Total Prescribed Dose 50.400000 Gy   Plan Primary Reference Point CW_L2   Rad Onc Aria Session Summary     Status: None   Collection Time: 05/26/22  8:57 AM  Result  Value Ref Range   Course ID [email or web address]    Course Intent Curative    Course Start Date 03/12/2022 11:27 AM    Session Number 26    Course First Treatment Date 04/15/2022  9:14 AM    Course Last Treatment Date 05/26/2022  8:56 AM    Course Elapsed Days 41    Reference Point ID [email or web address]    Reference Point Dosage Given to Date 46.80000026 Gy   Reference Point Session Dosage Given 1.80000001 Gy   Plan ID CW_L    Plan Fractions Treated to Date 26    Plan Total Fractions Prescribed 28    Plan Prescribed Dose Per Fraction 1.8 Gy   Plan Total Prescribed Dose 50.400000 Gy   Plan Primary Reference Point CW_L2   Rad Onc Aria Session Summary     Status: None   Collection Time: 05/28/22  8:54 AM  Result Value Ref Range   Course ID [email or web address]    Course Intent Curative    Course Start Date 03/12/2022 11:27 AM    Session Number 27    Course First Treatment Date 04/15/2022  9:14 AM    Course Last Treatment Date 05/28/2022  8:53 AM    Course Elapsed  Days 43    Reference Point ID [email or web address]    Reference Point Dosage Given to Date 48.60000027 Gy   Reference Point Session Dosage Given 1.80000001 Gy   Plan ID CW_L    Plan Fractions Treated to Date 27    Plan Total Fractions Prescribed 28    Plan Prescribed Dose Per Fraction 1.8 Gy   Plan Total Prescribed Dose 50.400000 Gy   Plan Primary Reference Point CW_L2   Rad Onc Aria Session Summary     Status: None   Collection Time: 05/29/22  8:56 AM  Result Value Ref Range   Course ID [email or web address]    Course Intent Curative    Course Start Date 03/12/2022 11:27 AM    Session Number 28    Course First Treatment Date 04/15/2022  9:14 AM    Course Last Treatment Date 05/29/2022  8:55 AM    Course Elapsed Days 44    Reference Point ID [email or web address]    Reference Point Dosage Given to Date 50.40000028 Gy   Reference Point Session Dosage Given 1.80000001 Gy   Plan ID CW_L    Plan Fractions Treated to Date 28    Plan Total Fractions Prescribed 28    Plan Prescribed Dose Per Fraction 1.8 Gy   Plan Total Prescribed Dose 50.400000 Gy   Plan Primary Reference Point CW_L2   Rad Onc Aria Session Summary     Status: None   Collection Time: 05/30/22  8:14 AM  Result Value Ref Range   Course ID [email or web address]    Course Intent Curative    Course Start Date 03/12/2022 11:27 AM    Session Number 29    Course First Treatment Date 04/15/2022  9:14 AM    Course Last Treatment Date 05/30/2022  8:13 AM    Course Elapsed Days 45    Reference Point ID [email or web address]    Reference Point Dosage Given to Date 52.40000028 Gy   Reference Point Session Dosage Given 2 Gy   Plan ID CW_Lt_Bst    Plan Name CW_Lt_Bst    Plan Fractions Treated to Date 1    Plan Total Fractions Prescribed 5    Plan Prescribed Dose Per Fraction 2 Gy   Plan Total Prescribed Dose 10.000000 Gy   Plan  Primary Reference Point CW_L2   Rad Onc Aria Session Summary     Status: None   Collection Time: 06/02/22  9:34 AM  Result Value Ref Range   Course ID [email or web address]    Course Intent  Curative    Course Start Date 03/12/2022 11:27 AM    Session Number 30    Course First Treatment Date 04/15/2022  9:14 AM    Course Last Treatment Date 06/02/2022  9:34 AM    Course Elapsed Days 48    Reference Point ID [email or web address]    Reference Point Dosage Given to Date 54.40000028 Gy   Reference Point Session Dosage Given 2 Gy   Plan ID CW_Lt_Bst    Plan Name CW_Lt_Bst    Plan Fractions Treated to Date 2    Plan Total Fractions Prescribed 5    Plan Prescribed Dose Per Fraction 2 Gy   Plan Total Prescribed Dose 10.000000 Gy   Plan Primary Reference Point CW_L2   Rad Sandria Senter Session Summary     Status: None   Collection Time: 06/03/22  9:04 AM  Result Value Ref Range   Course ID [email or web address]    Course Intent Curative    Course Start Date 03/12/2022 11:27 AM    Session Number 31    Course First Treatment Date 04/15/2022  9:14 AM    Course Last Treatment Date 06/03/2022  9:03 AM    Course Elapsed Days 49    Reference Point ID [email or web address]    Reference Point Dosage Given to Date 56.40000028 Gy   Reference Point Session Dosage Given 2 Gy   Plan ID CW_Lt_Bst    Plan Name CW_Lt_Bst    Plan Fractions Treated to Date 3    Plan Total Fractions Prescribed 5    Plan Prescribed Dose Per Fraction 2 Gy   Plan Total Prescribed Dose 10.000000 Gy   Plan Primary Reference Point CW_L2   Rad Sandria Senter Session Summary     Status: None   Collection Time: 06/04/22  9:32 AM  Result Value Ref Range   Course ID [email or web address]    Course Intent Curative    Course Start Date 03/12/2022 11:27 AM    Session Number 32    Course First Treatment Date 04/15/2022  9:14 AM    Course Last Treatment Date 06/04/2022  9:32 AM    Course Elapsed Days 50    Reference Point ID [email or web address]    Reference Point Dosage Given to Date 58.40000028 Gy   Reference Point Session Dosage Given 2 Gy   Plan ID CW_Lt_Bst    Plan Name CW_Lt_Bst    Plan Fractions Treated to Date 4    Plan Total Fractions Prescribed 5    Plan Prescribed Dose Per Fraction 2 Gy   Plan  Total Prescribed Dose 10.000000 Gy   Plan Primary Reference Point CW_L2   Rad Sandria Senter Session Summary     Status: None   Collection Time: 06/05/22  8:13 AM  Result Value Ref Range   Course ID [email or web address]    Course Intent Curative    Course Start Date 03/12/2022 11:27 AM    Session Number 33    Course First Treatment Date 04/15/2022  9:14 AM    Course Last Treatment Date 06/05/2022  8:12 AM    Course Elapsed Days 51    Reference Point ID [email or web address]    Reference Point Dosage Given to Date 16.10960454 Gy   Reference Point Session Dosage Given 2 Gy   Plan  ID CW_Lt_Bst    Plan Name CW_Lt_Bst    Plan Fractions Treated to Date 5    Plan Total Fractions Prescribed 5    Plan Prescribed Dose Per Fraction 2 Gy   Plan Total Prescribed Dose 10.000000 Gy   Plan Primary Reference Point CW_L2   POCT glycosylated hemoglobin (Hb A1C)     Status: Abnormal   Collection Time: 08/05/22  9:10 AM  Result Value Ref Range   Hemoglobin A1C 6.2 (A) 4.0 - 5.6 %   HbA1c POC (<> result, manual entry) 6.2 4.0 - 5.6 %   HbA1c, POC (prediabetic range) 6.2 5.7 - 6.4 %   HbA1c, POC (controlled diabetic range) 6.2 0.0 - 7.0 %   Objective  Body mass index is 29.53 kg/m. Wt Readings from Last 3 Encounters:  08/05/22 151 lb 3.2 oz (68.6 kg)  07/07/22 143 lb 9.6 oz (65.1 kg)  06/13/22 139 lb 8 oz (63.3 kg)   Temp Readings from Last 3 Encounters:  08/05/22 98.5 F (36.9 C) (Oral)  07/07/22 (!) 96.8 F (36 C)  06/13/22 97.8 F (36.6 C) (Tympanic)   BP Readings from Last 3 Encounters:  08/05/22 118/68  07/07/22 (!) 145/74  06/13/22 (!) 161/84   Pulse Readings from Last 3 Encounters:  08/05/22 89  07/07/22 83  06/13/22 76    Physical Exam Vitals and nursing note reviewed.  Constitutional:      Appearance: Normal appearance. She is well-developed and well-groomed.  HENT:     Head: Normocephalic and atraumatic.  Eyes:     Conjunctiva/sclera: Conjunctivae normal.     Pupils: Pupils are equal, round, and reactive  to light.  Cardiovascular:     Rate and Rhythm: Normal rate and regular rhythm.     Heart sounds: Normal heart sounds. No murmur heard. Pulmonary:     Effort: Pulmonary effort is normal.     Breath sounds: Normal breath sounds.  Abdominal:     General: Abdomen is flat. Bowel sounds are normal.     Tenderness: There is no abdominal tenderness.  Musculoskeletal:        General: No tenderness.  Skin:    General: Skin is warm and dry.  Neurological:     General: No focal deficit present.     Mental Status: She is alert and oriented to person, place, and time. Mental status is at baseline.     Cranial Nerves: Cranial nerves 2-12 are intact.     Motor: Motor function is intact.     Coordination: Coordination is intact.     Gait: Gait is intact.     Comments: Bl walks with cane   Psychiatric:        Attention and Perception: Attention and perception normal.        Mood and Affect: Mood and affect normal.        Speech: Speech normal.        Behavior: Behavior normal. Behavior is cooperative.        Thought Content: Thought content normal.        Cognition and Memory: Cognition and memory normal.        Judgment: Judgment normal.     Assessment  Plan  Hypertension associated with diabetes ([place]) both controlled- Plan: POCT glycosylated hemoglobin (Hb A1C) 6.2 today on jardiance 25 norvasc 5, coreg 25 bid, lipitor 80 with h/o stroke bp at goal   Cerebrovascular accident (CVA), unspecified mechanism ([place]) - Plan: atorvastatin (LIPITOR) 80 MG tablet, clopidogrel (PLAVIX)  75 MG tablet  Hyperlipidemia, unspecified hyperlipidemia type - Plan: atorvastatin (LIPITOR) 80 MG tablet  Essential hypertension - Plan: amLODipine (NORVASC) 5 MG tablet, lisinopril (ZESTRIL) 40 MG tablet, carvedilol (COREG) 25 MG tablet  Type 2 diabetes mellitus with hyperglycemia, without long-term current use of insulin ([facility]) - Plan: empagliflozin (JARDIANCE) 25 MG TABS tablet, POCT glycosylated hemoglobin (Hb  A1C)  Acute CVA (cerebrovascular accident) ([place]) - Plan: carvedilol (COREG) 25 MG tablet  Hypokalemia - Plan: potassium chloride SA (KLOR-CON M20) 20 MEQ tablet   HM Flu shot declines 2019 had  Tdap disc in future for prev declines all vaccines as of 11/30/19   shingrix declines prevnar utd pna 20 due declines today consider in the future covid vx had 4/4   Mammogram abnormal 2021 + breast cancer getting chemo -f/u h/o   Pap out of age window no h/o abnormal  Colonoscopy remotely ? In 1980s per pt Record consider in future vs cologuard  -as of 04/05/21 declines cologuard and colonoscopy will think about this and declines 08/05/22   No FH colon cancer      rec D3 4000 IU daily + MVT    Dr. Melrose Nakayama 03/2020 neurology call to reschedule   Rec healthy diet and exercise   Provider: Dr. Olivia Mackie McLean-Scocuzza-Internal Medicine

## 2022-08-14 ENCOUNTER — Inpatient Hospital Stay: Payer: PPO | Admitting: Oncology

## 2022-08-14 ENCOUNTER — Inpatient Hospital Stay: Payer: PPO

## 2022-08-19 ENCOUNTER — Inpatient Hospital Stay: Payer: PPO

## 2022-09-02 ENCOUNTER — Inpatient Hospital Stay: Payer: PPO | Attending: Radiation Oncology | Admitting: Oncology

## 2022-09-02 ENCOUNTER — Inpatient Hospital Stay: Payer: PPO

## 2022-09-02 ENCOUNTER — Encounter: Payer: Self-pay | Admitting: Oncology

## 2022-09-02 DIAGNOSIS — Z8673 Personal history of transient ischemic attack (TIA), and cerebral infarction without residual deficits: Secondary | ICD-10-CM | POA: Diagnosis not present

## 2022-09-02 DIAGNOSIS — Z9221 Personal history of antineoplastic chemotherapy: Secondary | ICD-10-CM | POA: Insufficient documentation

## 2022-09-02 DIAGNOSIS — E119 Type 2 diabetes mellitus without complications: Secondary | ICD-10-CM | POA: Insufficient documentation

## 2022-09-02 DIAGNOSIS — Z79899 Other long term (current) drug therapy: Secondary | ICD-10-CM | POA: Diagnosis not present

## 2022-09-02 DIAGNOSIS — C50912 Malignant neoplasm of unspecified site of left female breast: Secondary | ICD-10-CM | POA: Diagnosis not present

## 2022-09-02 DIAGNOSIS — Z87891 Personal history of nicotine dependence: Secondary | ICD-10-CM | POA: Insufficient documentation

## 2022-09-02 DIAGNOSIS — Z7902 Long term (current) use of antithrombotics/antiplatelets: Secondary | ICD-10-CM | POA: Diagnosis not present

## 2022-09-02 DIAGNOSIS — R232 Flushing: Secondary | ICD-10-CM | POA: Diagnosis not present

## 2022-09-02 DIAGNOSIS — Z170 Estrogen receptor positive status [ER+]: Secondary | ICD-10-CM | POA: Diagnosis not present

## 2022-09-02 DIAGNOSIS — C50919 Malignant neoplasm of unspecified site of unspecified female breast: Secondary | ICD-10-CM | POA: Diagnosis not present

## 2022-09-02 DIAGNOSIS — Z79811 Long term (current) use of aromatase inhibitors: Secondary | ICD-10-CM

## 2022-09-02 DIAGNOSIS — I2510 Atherosclerotic heart disease of native coronary artery without angina pectoris: Secondary | ICD-10-CM | POA: Insufficient documentation

## 2022-09-02 DIAGNOSIS — G8929 Other chronic pain: Secondary | ICD-10-CM | POA: Insufficient documentation

## 2022-09-02 DIAGNOSIS — Z7982 Long term (current) use of aspirin: Secondary | ICD-10-CM | POA: Insufficient documentation

## 2022-09-02 DIAGNOSIS — Z95828 Presence of other vascular implants and grafts: Secondary | ICD-10-CM

## 2022-09-02 DIAGNOSIS — Z923 Personal history of irradiation: Secondary | ICD-10-CM | POA: Diagnosis not present

## 2022-09-02 DIAGNOSIS — I10 Essential (primary) hypertension: Secondary | ICD-10-CM | POA: Diagnosis not present

## 2022-09-02 LAB — COMPREHENSIVE METABOLIC PANEL
ALT: 20 U/L (ref 0–44)
AST: 22 U/L (ref 15–41)
Albumin: 4.3 g/dL (ref 3.5–5.0)
Alkaline Phosphatase: 56 U/L (ref 38–126)
Anion gap: 8 (ref 5–15)
BUN: 26 mg/dL — ABNORMAL HIGH (ref 8–23)
CO2: 23 mmol/L (ref 22–32)
Calcium: 10.3 mg/dL (ref 8.9–10.3)
Chloride: 108 mmol/L (ref 98–111)
Creatinine, Ser: 0.81 mg/dL (ref 0.44–1.00)
GFR, Estimated: >60 mL/min (ref >60)
Glucose, Bld: 114 mg/dL — ABNORMAL HIGH (ref 70–99)
Potassium: 4.1 mmol/L (ref 3.5–5.1)
Sodium: 139 mmol/L (ref 135–145)
Total Bilirubin: 0.6 mg/dL (ref 0.3–1.2)
Total Protein: 7.1 g/dL (ref 6.5–8.1)

## 2022-09-02 LAB — CBC WITH DIFFERENTIAL/PLATELET
Abs Immature Granulocytes: 0.01 10*3/uL (ref 0.00–0.07)
Basophils Absolute: 0 10*3/uL (ref 0.0–0.1)
Basophils Relative: 1 %
Eosinophils Absolute: 0.1 10*3/uL (ref 0.0–0.5)
Eosinophils Relative: 2 %
HCT: 41 % (ref 36.0–46.0)
Hemoglobin: 13.9 g/dL (ref 12.0–15.0)
Immature Granulocytes: 0 %
Lymphocytes Relative: 27 %
Lymphs Abs: 1.5 10*3/uL (ref 0.7–4.0)
MCH: 31.4 pg (ref 26.0–34.0)
MCHC: 33.9 g/dL (ref 30.0–36.0)
MCV: 92.6 fL (ref 80.0–100.0)
Monocytes Absolute: 0.5 10*3/uL (ref 0.1–1.0)
Monocytes Relative: 9 %
Neutro Abs: 3.3 10*3/uL (ref 1.7–7.7)
Neutrophils Relative %: 61 %
Platelets: 159 10*3/uL (ref 150–400)
RBC: 4.43 MIL/uL (ref 3.87–5.11)
RDW: 14.1 % (ref 11.5–15.5)
WBC: 5.5 10*3/uL (ref 4.0–10.5)
nRBC: 0 % (ref 0.0–0.2)

## 2022-09-02 MED ORDER — SODIUM CHLORIDE 0.9% FLUSH
10.0000 mL | Freq: Once | INTRAVENOUS | Status: AC
  Administered 2022-09-02: 10 mL via INTRAVENOUS
  Filled 2022-09-02: qty 10

## 2022-09-02 MED ORDER — ANASTROZOLE 1 MG PO TABS: 1.0000 mg | ORAL_TABLET | Freq: Every day | ORAL | 5 refills | Status: DC

## 2022-09-02 MED ORDER — HEPARIN SOD (PORK) LOCK FLUSH 100 UNIT/ML IV SOLN
500.0000 [IU] | Freq: Once | INTRAVENOUS | Status: AC
  Administered 2022-09-02: 500 [IU] via INTRAVENOUS
  Filled 2022-09-02: qty 5

## 2022-09-02 NOTE — Assessment & Plan Note (Signed)
Recommend calcium 1200 mg daily with Vitamin D supplementation Baseline DEXA 2022 - normal Discussed about adjuvant bisphosphonate due to high risk breast cancer. Discussed about dental clearance.   Patient does not have dentist would like to defer adjuvant bisphosphonate treatment for now.

## 2022-09-02 NOTE — Progress Notes (Signed)
Hematology/Oncology Progress note [phone] [phone]      Patient Care Team: McLean-Scocuzza, Nino Glow, MD as PCP - General (Internal Medicine) Theodore Demark, RN (Inactive) as Oncology Nurse Navigator  ASSESSMENT & PLAN:   Cancer Staging  Invasive carcinoma of breast [facility]) Staging form: Breast, AJCC 8th Edition - Clinical stage from 05/16/2021: Stage IIB (cT3, cN0, cM0, G3, ER+, PR+, HER2-) - Signed by [name], MD on 06/03/2021   Invasive carcinoma of breast ([place]) Left breast invasive carcinoma, multiple sites.  ER 90%, PR 11-50%, HER2-cT3 N0, Ki-67 was 20%.  neoadjuvant chemotherapy ddAC x 4 followed by weekly Taxol--followed left mastectomy ypT2 pN0- adjuvant RT due to pectoralis muscle abut - lymph node negative disease.-I would not offer her adjuvant abemaciclib Currently on endocrine therapy with Arimidex.  She tolerates well. Reviewed and discussed with patient.  Continue Arimidex  Obtain annual screening mammogram of right breast.  Patient prefers to have mammogram done in November 2023.   Aromatase inhibitor use Recommend calcium 1200 mg daily with Vitamin D supplementation Baseline DEXA 2022 - normal Discussed about adjuvant bisphosphonate due to high risk breast cancer. Discussed about dental clearance.   Patient does not have dentist would like to defer adjuvant bisphosphonate treatment for now.  Port-A-Cath in place recommend port flush every 8 weeks  Orders Placed This Encounter  Procedures   MM 3D SCREEN BREAST UNI RIGHT    Standing Status:   Future    Standing Expiration Date:   09/03/2023    Order Specific Question:   Reason for Exam (SYMPTOM  OR DIAGNOSIS REQUIRED)    Answer:   hx breast cancer    Order Specific Question:   Preferred imaging location?    Answer:   [facility]   CBC with Differential/Platelet    Standing Status:   Future    Standing Expiration Date:   09/03/2023   Comprehensive metabolic panel     Standing Status:   Future    Standing Expiration Date:   09/02/2023   Lab MD 4 months flex, cbc cmp All questions were answered. The patient knows to call the clinic with any problems, questions or concerns.  [name], MD, PhD [facility] Hematology Oncology 09/02/2022   CHIEF COMPLAINTS/REASON FOR VISIT:  multifocal left breast cancer  HISTORY OF PRESENTING ILLNESS:   Tammy Nunez is a  71 y.o.  female presents for follow up of multifocal left breast cancer.  Oncology summary listed below.  Oncology History  Breast cancer in female [facility])  05/08/2021 Initial Diagnosis   Breast cancer in female [facility])   06/03/2021 - 06/03/2021 Chemotherapy         06/21/2021 - 01/21/2022 Chemotherapy   Patient is on Treatment Plan : BREAST ADJUVANT DOSE DENSE AC q14d / PACLitaxel q7d     Invasive carcinoma of breast ([place])  05/02/2021 Mammogram   left breast diagnostic mammogram and ultrasound showed multiple irregular mass in the left breast. Mammogram mass 1 irregular mass associated with distortion in the upper breast at middle depth, multiple adjacent and possible continuous irregular masses, conglomeration of masses measures at least 5.6 cm.  Associated pleomorphic calcifications extending at least 5 x 3.9 x 3.7 cm Mass 2, 1 cm mass in the upper breast at the middle to posterior depth. Mass 3, 5 mm irregular mass in the upper breast at posterior depth. In total, mass 1-mass 3 span at least 8.2 cm in anterior-posterior extent.   By ultrasound  mass 1 12:00 5  cm from nipple, 2.7 x 1.5 x 4.3 cm Mass 2, 12:00 12 cm from nipple, 0.9 x 0.9 x 0.5 cm Mass 3   12:00 14 cm from nipple, 0.4 x 0.4 x 0.4 cm-uses 2.4 cm superior to mass to Mass 4   11:00 No suspicious left axillary adenopathy.   05/07/2021 Initial Diagnosis   Invasive carcinoma of breast  - Ultrasound-guided breast biopsy Breast left 12:00 mass 5 cm from nipple biopsy showed invasive mammary carcinoma, no special type, grade 3,  high-grade DCIS present, no LVI, ER 90%, PR 11-50%, HER2 negative by IHC Breast left 11:00 mass 10 cm from nipple biopsy showed invasive mammary carcinoma, no special type, grade 2, no DCIS/LVI identified.ER 90%, PR 11-50%, HER2 negative by [facility]     05/16/2021 Cancer Staging   Staging form: Breast, AJCC 8th Edition - Clinical stage from 05/16/2021: Stage IIB (cT3, cN0, cM0, G3, ER+, PR+, HER2-) - Signed by [name], MD on 06/03/2021 Stage prefix: Initial diagnosis Histologic grading system: 3 grade system   05/22/2021 Imaging   MRI breast showed that nonmass like malignancy spanning over 8 cm, abuting anterior aspect of pectoralis muscle. Discussed with Dr.Cintron and we agree that she will need mastectomy. There is concern of possible positive margin. I recommend neoadjuvant chemotherapy   06/06/2021 Miscellaneous   Medi port was placed by Dr.Cintron   06/13/2021 Echocardiogram   pre chemotherapy Echo. LVEF 60-65%.  Normal global longitudinal strain. Grade 1 diastolic dysfunction   3/55/7322 - 10/25/2021 Chemotherapy   ddAC x 4 with GCSF followed by weekly Taxol x 12 Chemotherapy break due to side effects, UTI and the patient's preference.   02/17/2022 Surgery   Patient underwent left simple mastectomy, and a sentinel lymph node biopsy.  Pathology showed invasive mammary carcinoma, high-grade DCIS, 4 lymph nodes were sampled and were negative for cancer. ypT2 pN0, grade 3, multifocal invasive carcinoma.  All margins are negative for invasive carcinoma and DCIS. Ki-67 was 20%.   04/29/2022 - 06/03/2022 Radiation Therapy   Due to the initial mass potentially abutting the pectoralis muscle, she received adjuvant chest wall radiation   06/13/2022 -  Anti-estrogen oral therapy   Start on Arimidex 73m daily     INTERVAL HISTORY AKenneth Nunez a 71y.o. female who has above history reviewed by me today presents for follow up visit for  multifocal left breast cancer Patient reports reports  feeling well.  Patient tolerates Arimidex well. Manageable hot flash  Review of Systems  Constitutional:  Negative for appetite change, chills, fatigue and fever.  HENT:   Negative for hearing loss and voice change.   Eyes:  Negative for eye problems.  Respiratory:  Negative for chest tightness and cough.   Cardiovascular:  Negative for chest pain.  Gastrointestinal:  Negative for abdominal distention, abdominal pain, blood in stool, diarrhea and nausea.  Endocrine: Negative for hot flashes.  Genitourinary:  Negative for difficulty urinating, dysuria and frequency.   Musculoskeletal:  Negative for arthralgias.  Skin:  Negative for itching and rash.  Neurological:  Negative for extremity weakness.  Hematological:  Negative for adenopathy. Does not bruise/bleed easily.  Psychiatric/Behavioral:  Negative for confusion.     MEDICAL HISTORY:  Past Medical History:  Diagnosis Date   Anemia    [place]   Breast cancer in female ([facility] 05/08/2021   Chronic left shoulder pain    Coronary artery disease    x 1 stent   Diabetes mellitus without complication ([place]  Dysrhythmia    Hypertension    Hypomagnesemia 09/13/2021   Paralysis ([place])    weakness left upper amd lower extremity   PONV (postoperative nausea and vomiting)    Stroke [facility])     SURGICAL HISTORY: Past Surgical History:  Procedure Laterality Date   BREAST BIOPSY Left 05/07/2021   12:00 5cmfn vision clip path pending   BREAST BIOPSY Left 05/07/2021   11:00 10 cmfn mini cork clip path pending   CAROTID PTA/STENT INTERVENTION Left 07/20/2019   Procedure: CAROTID PTA/STENT INTERVENTION;  Surgeon: Katha Cabal, MD;  Location: [place] CV LAB;  Service: Cardiovascular;  Laterality: Left;   ECTOPIC PREGNANCY SURGERY     PORTACATH PLACEMENT N/A 06/06/2021   Procedure: INSERTION PORT-A-CATH;  Surgeon: Herbert Pun, MD;  Location: [facility] ORS;  Service: General;  Laterality: N/A;    SENTINEL NODE BIOPSY Left 02/17/2022   Procedure: SENTINEL NODE BIOPSY;  Surgeon: Herbert Pun, MD;  Location: [facility] ORS;  Service: General;  Laterality: Left;   TOTAL MASTECTOMY Left 02/17/2022   Procedure: TOTAL MASTECTOMY;  Surgeon: Herbert Pun, MD;  Location: [facility] ORS;  Service: General;  Laterality: Left;    SOCIAL HISTORY: Social History   Socioeconomic History   Marital status: Married    Spouse name: Izell [address]ow [address]   Number of children: Not on file   Years of education: Not on file   Highest education level: Not on file  Occupational History   Not on file  Tobacco Use   Smoking status: Former    Packs/day: 0.50    Years: 15.00    Total pack years: 7.50    Types: Cigarettes    Quit date: 04/11/2019    Years since quitting: 3.3   Smokeless tobacco: Never  Vaping Use   Vaping Use: Never used  Substance and Sexual Activity   Alcohol use: Never   Drug use: Never   Sexual activity: Not Currently  Other Topics Concern   Not on file  Social History Narrative   No kids    Married with husband    Used to work [employer] broadcast assistant    Social Determinants of Health   Financial Resource Strain: Low Risk  (11/28/2021)   Overall Financial Resource Strain (CARDIA)    Difficulty of Paying Living Expenses: Not hard at all  Food Insecurity: No Food Insecurity (02/05/2022)   Hunger Vital Sign    Worried About Running Out of Food in the Last Year: Never true    Ran Out of Food in the Last Year: Never true  Transportation Needs: No Transportation Needs (02/05/2022)   PRAPARE - Hydrologist (Medical): No    Lack of Transportation (Non-Medical): No  Physical Activity: Sufficiently Active (09/13/2020)   Exercise Vital Sign    Days of Exercise per Week: 7 days    Minutes of Exercise per Session: 30 min  Stress: Stress Concern Present (06/09/2019)   [place]    Feeling  of Stress : Very much  Social Connections: Socially Integrated (06/09/2019)   Social Connection and Isolation Panel [NHANES]    Frequency of Communication with Friends and Family: Three times a week    Frequency of Social Gatherings with Friends and Family: Once a week    Attends Religious Services: 1 to 4 times per year    Active Member of [employer] or Organizations: Yes    Attends Archivist Meetings: 1 to 4 times per  year    Marital Status: Married  Human resources officer Violence: Not At Risk (02/05/2022)   Humiliation, Afraid, Rape, and Kick questionnaire    Fear of Current or Ex-Partner: No    Emotionally Abused: No    Physically Abused: No    Sexually Abused: No    FAMILY HISTORY: Family History  Problem Relation Age of Onset   Diabetes type II Mother    Hypertension Father    Heart attack Father    Diabetes type II Brother    Breast cancer Neg Hx     ALLERGIES:  has No Known Allergies.  MEDICATIONS:  Current Outpatient Medications  Medication Sig Dispense Refill   amLODipine (NORVASC) 5 MG tablet Take 1 tablet (5 mg total) by mouth daily. 90 tablet 3   aspirin 81 MG EC tablet Take 1 tablet (81 mg total) by mouth daily. 90 tablet 3   atorvastatin (LIPITOR) 80 MG tablet Take 1 tablet (80 mg total) by mouth daily. 90 tablet 3   blood glucose meter kit and supplies 1 each by Other route 4 (four) times daily. Dispense based on patient and insurance preference. Four times daily as directed. (FOR ICD-10 E10.9, E11.9). 1 each 0   calcium carbonate (OS-CAL) 600 MG tablet Take 2 tablets (1,200 mg total) by mouth daily. 180 tablet 3   carvedilol (COREG) 25 MG tablet Take 1 tablet (25 mg total) by mouth 2 (two) times daily with a meal. 180 tablet 3   clopidogrel (PLAVIX) 75 MG tablet Take 1 tablet (75 mg total) by mouth daily. 90 tablet 3   Continuous Blood Gluc Sensor (FREESTYLE LIBRE 2 SENSOR) MISC USE TO CHECK BLOOD SUGAR AT LEAST EVERY 8 HOURS 6 each 0   empagliflozin  (JARDIANCE) 25 MG TABS tablet Take 1 tablet (25 mg total) by mouth daily. 90 tablet 3   HYDROcodone-acetaminophen (NORCO/VICODIN) 5-325 MG tablet Take 1 tablet by mouth every 4 (four) hours as needed for moderate pain. 16 tablet 0   lisinopril (ZESTRIL) 40 MG tablet Take 1 tablet (40 mg total) by mouth daily. 90 tablet 3   loperamide (IMODIUM) 2 MG capsule Take 1 capsule (2 mg total) by mouth See admin instructions. Take 69m at the onset of diarrhea, and then 288mafter each loose bowel movement, maximum 1683mer 24 hours. 30 capsule 1   loratadine (CLARITIN) 10 MG tablet Take 1 tablet (10 mg total) by mouth daily. 90 tablet 3   Multiple Vitamin (MULTIVITAMIN) tablet Take 1 tablet by mouth daily.     potassium chloride SA (KLOR-CON M20) 20 MEQ tablet Take 1 tablet (20 mEq total) by mouth daily. Clarify its 1x per day 90 tablet 3   vitamin B-12 (CYANOCOBALAMIN) 1000 MCG tablet Take 1 tablet (1,000 mcg total) by mouth daily. 30 tablet 1   Vitamin D, Cholecalciferol, 25 MCG (1000 UT) TABS Take 1,000 Units by mouth.     anastrozole (ARIMIDEX) 1 MG tablet Take 1 tablet (1 mg total) by mouth daily. 30 tablet 5   No current facility-administered medications for this visit.   Facility-Administered Medications Ordered in Other Visits  Medication Dose Route Frequency Provider Last Rate Last Admin   heparin lock flush 100 UNIT/ML injection              PHYSICAL EXAMINATION: ECOG PERFORMANCE STATUS: 2 - Symptomatic, <50% confined to bed Vitals:   09/02/22 1315  BP: (!) 139/49  Pulse: 72  Resp: 18  Temp: 98 F (36.7 C)  Filed Weights   09/02/22 1315  Weight: 147 lb 8 oz (66.9 kg)      Physical Exam Constitutional:      General: She is not in acute distress. HENT:     Head: Normocephalic and atraumatic.  Eyes:     General: No scleral icterus. Cardiovascular:     Rate and Rhythm: Normal rate and regular rhythm.     Heart sounds: Normal heart sounds.  Pulmonary:     Effort: Pulmonary  effort is normal. No respiratory distress.     Breath sounds: No wheezing.  Abdominal:     General: Bowel sounds are normal. There is no distension.     Palpations: Abdomen is soft.  Musculoskeletal:        General: No deformity. Normal range of motion.     Cervical back: Normal range of motion and neck supple.  Skin:    General: Skin is warm and dry.     Findings: No erythema or rash.  Neurological:     Mental Status: She is alert and oriented to person, place, and time. Mental status is at baseline.     Comments: Chronic left upper and lower extremity weakness.  Psychiatric:        Mood and Affect: Mood normal.   Left breast mass palpable, similar size.    LABORATORY DATA:  I have reviewed the data as listed Lab Results  Component Value Date   WBC 5.5 09/02/2022   HGB 13.9 09/02/2022   HCT 41.0 09/02/2022   MCV 92.6 09/02/2022   PLT 159 09/02/2022   Recent Labs    04/14/22 1431 04/29/22 0934 09/02/22 1238  NA 140 140 139  K 3.9 3.7 4.1  CL 106 109 108  CO2 22 23 23   GLUCOSE 133* 102* 114*  BUN 16 17 26*  CREATININE 0.85 0.70 0.81  CALCIUM 9.6 9.0 10.3  GFRNONAA >60 >60 >60  PROT 6.8 6.7 7.1  ALBUMIN 4.2 4.1 4.3  AST 33 26 22  ALT 30 34 20  ALKPHOS 45 41 56  BILITOT 0.3 0.5 0.6    Iron/TIBC/Ferritin/ %Sat    Component Value Date/Time   IRON 113 12/08/2019 1015   TIBC 317 12/08/2019 1015   FERRITIN 67 12/08/2019 1015   IRONPCTSAT 36 12/08/2019 1015       RADIOGRAPHIC STUDIES: I have personally reviewed the radiological images as listed and agreed with the findings in the report. No results found.

## 2022-09-02 NOTE — Assessment & Plan Note (Signed)
recommend port flush every 8 weeks 

## 2022-09-02 NOTE — Assessment & Plan Note (Addendum)
Left breast invasive carcinoma, multiple sites.  ER 90%, PR 11-50%, HER2-cT3 N0, Ki-67 was 20%.  neoadjuvant chemotherapy ddAC x 4 followed by weekly Taxol--followed left mastectomy ypT2 pN0- adjuvant RT due to pectoralis muscle abut - lymph node negative disease.-I would not offer her adjuvant abemaciclib Currently on endocrine therapy with Arimidex.  She tolerates well. Reviewed and discussed with patient.  Continue Arimidex  Obtain annual screening mammogram of right breast.  Patient prefers to have mammogram done in November 2023.

## 2022-09-03 ENCOUNTER — Encounter: Payer: Self-pay | Admitting: Oncology

## 2022-09-03 ENCOUNTER — Telehealth: Payer: Self-pay | Admitting: Internal Medicine

## 2022-09-03 NOTE — Telephone Encounter (Signed)
Copied from [place] [phone]. Topic: Medicare AWV >> Sep 03, 2022  2:08 PM Devoria Glassing wrote: Reason for CRM: Left message for patient to schedule Annual Wellness Visit.  Please schedule with Nurse Health Advisor Denisa O'Brien-Blaney, LPN at [facility]. This appt can be telephone or office visit.  Please call [phone] ask for [facility]

## 2022-09-04 ENCOUNTER — Ambulatory Visit (INDEPENDENT_AMBULATORY_CARE_PROVIDER_SITE_OTHER): Payer: PPO

## 2022-09-04 VITALS — Ht 60.0 in | Wt 147.0 lb

## 2022-09-04 DIAGNOSIS — Z0000 Encounter for general adult medical examination without abnormal findings: Secondary | ICD-10-CM

## 2022-09-04 NOTE — Progress Notes (Signed)
Subjective:   Tammy Nunez is a 71 y.o. female who presents for an Initial Medicare Annual Wellness Visit.  Review of Systems    No ROS.  Medicare Wellness Virtual Visit.  Visual/audio telehealth visit, UTA vital signs.   See social history for additional risk factors.   Cardiac Risk Factors include: advanced age (>14mn, >>52women)     Objective:    Today's Vitals   09/04/22 1433  Weight: 147 lb (66.7 kg)  Height: 5' (1.524 m)   Body mass index is 28.71 kg/m.     09/04/2022    2:36 PM 09/02/2022    1:09 PM 06/13/2022    1:22 PM 03/06/2022   10:38 AM 03/03/2022    2:20 PM 02/17/2022    3:00 PM 02/11/2022    4:27 PM  Advanced Directives  Does Patient Have a Medical Advance Directive? No No No No No No No  Would patient like information on creating a medical advance directive? No - Patient declined No - Patient declined No - Patient declined No - Patient declined No - Patient declined No - Patient declined     Current Medications (verified) Outpatient Encounter Medications as of 09/04/2022  Medication Sig   amLODipine (NORVASC) 5 MG tablet Take 1 tablet (5 mg total) by mouth daily.   anastrozole (ARIMIDEX) 1 MG tablet Take 1 tablet (1 mg total) by mouth daily.   aspirin 81 MG EC tablet Take 1 tablet (81 mg total) by mouth daily.   atorvastatin (LIPITOR) 80 MG tablet Take 1 tablet (80 mg total) by mouth daily.   blood glucose meter kit and supplies 1 each by Other route 4 (four) times daily. Dispense based on patient and insurance preference. Four times daily as directed. (FOR ICD-10 E10.9, E11.9).   calcium carbonate (OS-CAL) 600 MG tablet Take 2 tablets (1,200 mg total) by mouth daily.   carvedilol (COREG) 25 MG tablet Take 1 tablet (25 mg total) by mouth 2 (two) times daily with a meal.   clopidogrel (PLAVIX) 75 MG tablet Take 1 tablet (75 mg total) by mouth daily.   Continuous Blood Gluc Sensor (FREESTYLE LIBRE 2 SENSOR) MISC USE TO CHECK BLOOD SUGAR AT LEAST EVERY 8  HOURS   empagliflozin (JARDIANCE) 25 MG TABS tablet Take 1 tablet (25 mg total) by mouth daily.   HYDROcodone-acetaminophen (NORCO/VICODIN) 5-325 MG tablet Take 1 tablet by mouth every 4 (four) hours as needed for moderate pain. (Patient not taking: Reported on 09/04/2022)   lisinopril (ZESTRIL) 40 MG tablet Take 1 tablet (40 mg total) by mouth daily.   loperamide (IMODIUM) 2 MG capsule Take 1 capsule (2 mg total) by mouth See admin instructions. Take 435mat the onset of diarrhea, and then 75m81mfter each loose bowel movement, maximum 24m6mr 24 hours.   loratadine (CLARITIN) 10 MG tablet Take 1 tablet (10 mg total) by mouth daily.   Multiple Vitamin (MULTIVITAMIN) tablet Take 1 tablet by mouth daily.   potassium chloride SA (KLOR-CON M20) 20 MEQ tablet Take 1 tablet (20 mEq total) by mouth daily. Clarify its 1x per day   vitamin B-12 (CYANOCOBALAMIN) 1000 MCG tablet Take 1 tablet (1,000 mcg total) by mouth daily.   Vitamin D, Cholecalciferol, 25 MCG (1000 UT) TABS Take 1,000 Units by mouth.   [DISCONTINUED] prochlorperazine (COMPAZINE) 10 MG tablet Take 1 tablet (10 mg total) by mouth every 6 (six) hours as needed (Nausea or vomiting).   Facility-Administered Encounter Medications as of 09/04/2022  Medication  heparin lock flush 100 UNIT/ML injection    Allergies (verified) Patient has no known allergies.   History: Past Medical History:  Diagnosis Date   Anemia    Breast cancer ([place]) 02/17/2022   Breast cancer in female [facility]) 05/08/2021   Chronic left shoulder pain    Coronary artery disease    x 1 stent   Diabetes mellitus without complication ([place])    Dysrhythmia    Hypertension    Hypomagnesemia 09/13/2021   Paralysis ([place])    weakness left upper amd lower extremity   PONV (postoperative nausea and vomiting)    Stroke [facility])    Past Surgical History:  Procedure Laterality Date   BREAST BIOPSY Left 05/07/2021   12:00 5cmfn vision clip path pending   BREAST BIOPSY Left  05/07/2021   11:00 10 cmfn mini cork clip path pending   CAROTID PTA/STENT INTERVENTION Left 07/20/2019   Procedure: CAROTID PTA/STENT INTERVENTION;  Surgeon: Katha Cabal, MD;  Location: [facility];  Service: Cardiovascular;  Laterality: Left;   ECTOPIC PREGNANCY SURGERY     PORTACATH PLACEMENT N/A 06/06/2021   Procedure: INSERTION PORT-A-CATH;  Surgeon: Herbert Pun, MD;  Location: [facility] ORS;  Service: General;  Laterality: N/A;   SENTINEL NODE BIOPSY Left 02/17/2022   Procedure: SENTINEL NODE BIOPSY;  Surgeon: Herbert Pun, MD;  Location: [facility] OR[facility];  Service: General;  Laterality: Left;   TOTAL MASTECTOMY Left 02/17/2022   Procedure: TOTAL MASTECTOMY;  Surgeon: Herbert Pun, MD;  Location: [facility] OR[facility];  Service: General;  Laterality: Left;   Family History  Problem Relation Age of Onset   Diabetes type II Mother    Hypertension Father    Heart attack Father    Diabetes type II Brother    Breast cancer Neg Hx    Social History   Socioeconomic History   Marital status: Married    Spouse name: Izell Von Ormy   Number of children: Not on file   Years of education: Not on file   Highest education level: Not on file  Occupational History   Not on file  Tobacco Use   Smoking status: Former    Packs/day: 0.50    Years: 15.00    Total pack years: 7.50    Types: Cigarettes    Quit date: 04/11/2019    Years since quitting: 3.4   Smokeless tobacco: Never  Vaping Use   Vaping Use: Never used  Substance and Sexual Activity   Alcohol use: Never   Drug use: Never   Sexual activity: Not Currently  Other Topics Concern   Not on file  Social History Narrative   No kids    Married with husband    Used to work [employer] broadcast assistant    Social Determinants of Health   Financial Resource Strain: Low Risk  (09/04/2022)   Overall Financial Resource Strain (CARDIA)    Difficulty of Paying Living Expenses: Not hard at all  Food Insecurity: No Food  Insecurity (09/04/2022)   Hunger Vital Sign    Worried About Running Out of Food in the Last Year: Never true    Ran Out of Food in the Last Year: Never true  Transportation Needs: No Transportation Needs (09/04/2022)   PRAPARE - Hydrologist (Medical): No    Lack of Transportation (Non-Medical): No  Physical Activity: Sufficiently Active (09/04/2022)   Exercise Vital Sign    Days of Exercise per Week: 7 days    Minutes of Exercise  per Session: 30 min  Stress: No Stress Concern Present (09/04/2022)   [place]    Feeling of Stress : Not at all  Social Connections: [place] (09/04/2022)   Social Connection and Isolation Panel [NHANES]    Frequency of Communication with Friends and Family: Three times a week    Frequency of Social Gatherings with Friends and Family: Once a week    Attends Religious Services: 1 to 4 times per year    Active Member of [employer] or Organizations: Yes    Attends Archivist Meetings: 1 to 4 times per year    Marital Status: Married    Tobacco Counseling Counseling given: Not Answered   Clinical Intake:  Pre-visit preparation completed: Yes        Diabetes: Yes (Followed by PCP)  How often do you need to have someone help you when you read instructions, pamphlets, or other written materials from your doctor or pharmacy?: 1 - Never   Interpreter Needed?: No      Activities of Daily Living    09/04/2022    2:41 PM 02/17/2022    3:00 PM  In your present state of health, do you have any difficulty performing the following activities:  Hearing? 0 1  Vision? 0 0  Difficulty concentrating or making decisions? 0 0  Walking or climbing stairs? 0 0  Comment Cane in use as needed   Dressing or bathing? 0 0  Doing errands, shopping? 1 0  Comment Husband assist   Preparing Food and eating ? N   Using the Toilet? N   In the past six  months, have you accidently leaked urine? N   Do you have problems with loss of bowel control? N   Managing your Medications? N   Managing your Finances? N   Housekeeping or managing your Housekeeping? N     Patient Care Team: McLean-Scocuzza, Nino Glow, MD as PCP - General (Internal Medicine) Theodore Demark, RN (Inactive) as Oncology Nurse Navigator  Indicate any recent Medical Services you may have received from other than [facility] providers in the past year (date may be approximate).     Assessment:   This is a routine wellness examination for Tanaysia.  I connected with  Konrad Penta on 09/04/22 by a audio enabled telemedicine application and verified that I am speaking with the correct person using two identifiers.  Patient Location: Home  Provider Location: Office/Clinic  I discussed the limitations of evaluation and management by telemedicine. The patient expressed understanding and agreed to proceed.   Hearing/Vision screen Hearing Screening - Comments:: Patient is able to hear conversational tones without difficulty.  No issues reported.   Vision Screening - Comments:: Followed by Dr. Ellin Mayhew Wears corrective lenses No retinopathy reported They have seen their ophthalmologist in the last 12 months.    Dietary issues and exercise activities discussed: Current Exercise Habits: Home exercise routine, Type of exercise: walking, Time (Minutes): 30, Frequency (Times/Week): 7, Weekly Exercise (Minutes/Week): 210, Intensity: Mild   Goals Addressed             This Visit's Progress    Maintain healthy lifestyle       Stay active Healthy diet       Depression Screen    09/04/2022    2:40 PM 08/05/2022    8:38 AM 03/05/2022    9:43 AM 02/05/2022    9:40 AM 10/09/2021    8:50 AM 07/10/2020  11:02 AM 02/15/2020   11:09 AM  PHQ 2/9 Scores  PHQ - 2 Score 0  0 0 0 0 0  Exception Documentation  Patient refusal         Fall Risk    09/04/2022    2:39 PM 08/05/2022     8:38 AM 03/05/2022    9:42 AM 02/05/2022    9:40 AM 04/05/2021    8:53 AM  Fall Risk   Falls in the past year? 0 0 0 0 0  Number falls in past yr: 0 0 0 0 0  Injury with Fall?  0 0 0 0  Risk for fall due to :  Other (Comment) No Fall Risks Impaired vision;Medication side effect;Impaired balance/gait;Impaired mobility   Risk for fall due to: Comment Cane in use as needed      Follow up Falls evaluation completed Falls evaluation completed Falls evaluation completed  Falls evaluation completed    [place]: Home free of loose throw rugs in walkways, pet beds, electrical cords, etc? Yes  Adequate lighting in your home to reduce risk of falls? Yes   ASSISTIVE DEVICES UTILIZED TO PREVENT FALLS: Life alert? No  Use of a cane, walker or w/c? Yes , as needed Grab bars in the bathroom? Yes  Shower chair or bench in shower? No  Elevated toilet seat or a handicapped toilet? No   TIMED UP AND GO: Was the test performed? No .   Cognitive Function:        09/04/2022    2:42 PM  6CIT Screen  What Year? 0 points  What month? 0 points  What time? 0 points  Count back from 20 0 points  Months in reverse 0 points  Repeat phrase 0 points  Total Score 0 points    Immunizations Immunization History  Administered Date(s) Administered   PFIZER Comirnaty(Gray Top)Covid-19 Tri-Sucrose Vaccine 03/26/2021   PFIZER(Purple Top)SARS-COV-2 Vaccination 01/11/2020, 02/01/2020, 09/04/2020   Pneumococcal Conjugate-13 10/11/2020   TDAP status: Due, Education has been provided regarding the importance of this vaccine. Advised may receive this vaccine at local pharmacy or Health Dept. Aware to provide a copy of the vaccination record if obtained from local pharmacy or Health Dept. Verbalized acceptance and understanding.  Screening Tests Health Maintenance  Topic Date Due   FOOT EXAM  09/24/2022 (Originally 04/05/2022)   COVID-19 Vaccine (5 - Pfizer risk series)  10/24/2022 (Originally 05/21/2021)   INFLUENZA VACCINE  02/22/2023 (Originally 06/24/2022)   COLONOSCOPY (Pts 45-53yr Insurance coverage will need to be confirmed)  08/06/2023 (Originally 02/01/1996)   TETANUS/TDAP  09/05/2023 (Originally 01/31/1970)   OPHTHALMOLOGY EXAM  12/08/2022   HEMOGLOBIN A1C  02/03/2023   Diabetic kidney evaluation - Urine ACR  03/06/2023   MAMMOGRAM  04/25/2023   Diabetic kidney evaluation - GFR measurement  09/03/2023   DEXA SCAN  Completed   Hepatitis C Screening  Completed   HPV VACCINES  Aged Out   Pneumonia Vaccine 71 Years old  Discontinued   Zoster Vaccines- Shingrix  Discontinued    Health Maintenance There are no preventive care reminders to display for this patient.  Lung Cancer Screening: (Low Dose CT Chest recommended if Age 71-80years, 30 pack-year currently smoking OR have quit w/in 15years.) does not qualify.   Hepatitis C Screening: Completed 2021.  Vision Screening: Recommended annual ophthalmology exams for early detection of glaucoma and other disorders of the eye.  Dental Screening: Recommended annual dental exams for proper oral  hygiene. Denture.   Community Resource Referral / Chronic Care Management: CRR required this visit?  No   CCM required this visit?  No      Plan:     I have personally reviewed and noted the following in the patient's chart:   Medical and social history Use of alcohol, tobacco or illicit drugs  Current medications and supplements including opioid prescriptions. Patient is currently taking opioid prescriptions. Information provided to patient regarding non-opioid alternatives. Patient advised to discuss non-opioid treatment plan with their provider. Functional ability and status Nutritional status Physical activity Advanced directives List of other physicians Hospitalizations, surgeries, and ER visits in previous 12 months Vitals Screenings to include cognitive, depression, and falls Referrals and  appointments  In addition, I have reviewed and discussed with patient certain preventive protocols, quality metrics, and best practice recommendations. A written personalized care plan for preventive services as well as general preventive health recommendations were provided to patient.     Varney Biles, LPN   75/03/1832

## 2022-09-04 NOTE — Patient Instructions (Signed)
Tammy Nunez , Thank you for taking time to come for your Medicare Wellness Visit. I appreciate your ongoing commitment to your health goals. Please review the following plan we discussed and let me know if I can assist you in the future.   These are the goals we discussed:  Goals      Maintain healthy lifestyle     Stay active Healthy diet        This is a list of the screening recommended for you and due dates:  Health Maintenance  Topic Date Due   Complete foot exam   09/24/2022*   COVID-19 Vaccine (5 - Pfizer risk series) 10/24/2022*   Flu Shot  02/22/2023*   Colon Cancer Screening  08/06/2023*   Tetanus Vaccine  09/05/2023*   Eye exam for diabetics  12/08/2022   Hemoglobin A1C  02/03/2023   Yearly kidney health urinalysis for diabetes  03/06/2023   Mammogram  04/25/2023   Yearly kidney function blood test for diabetes  09/03/2023   DEXA scan (bone density measurement)  Completed   Hepatitis C Screening: USPSTF Recommendation to screen - Ages 85-79 yo.  Completed   HPV Vaccine  Aged Out   Pneumonia Vaccine  Discontinued   Zoster (Shingles) Vaccine  Discontinued  *Topic was postponed. The date shown is not the original due date.    Advanced directives: End of life planning; Advanced aging; Advanced directives discussed.  No HCPOA/Living Will.  Additional information declined at this time.  Conditions/risks identified: none new  Next appointment: Follow up in one year for your annual wellness visit    Preventive Care 65 Years and Older, Female Preventive care refers to lifestyle choices and visits with your health care provider that can promote health and wellness. What does preventive care include? A yearly physical exam. This is also called an annual well check. Dental exams once or twice a year. Routine eye exams. Ask your health care provider how often you should have your eyes checked. Personal lifestyle choices, including: Daily care of your teeth and  gums. Regular physical activity. Eating a healthy diet. Avoiding tobacco and drug use. Limiting alcohol use. Practicing safe sex. Taking low-dose aspirin every day. Taking vitamin and mineral supplements as recommended by your health care provider. What happens during an annual well check? The services and screenings done by your health care provider during your annual well check will depend on your age, overall health, lifestyle risk factors, and family history of disease. Counseling  Your health care provider may ask you questions about your: Alcohol use. Tobacco use. Drug use. Emotional well-being. Home and relationship well-being. Sexual activity. Eating habits. History of falls. Memory and ability to understand (cognition). Work and work Statistician. Reproductive health. Screening  You may have the following tests or measurements: Height, weight, and BMI. Blood pressure. Lipid and cholesterol levels. These may be checked every 5 years, or more frequently if you are over 37 years old. Skin check. Lung cancer screening. You may have this screening every year starting at age 13 if you have a 30-pack-year history of smoking and currently smoke or have quit within the past 15 years. Fecal occult blood test (FOBT) of the stool. You may have this test every year starting at age 8. Flexible sigmoidoscopy or colonoscopy. You may have a sigmoidoscopy every 5 years or a colonoscopy every 10 years starting at age 81. Hepatitis C blood test. Hepatitis B blood test. Sexually transmitted disease (STD) testing. Diabetes screening. This is done  by checking your blood sugar (glucose) after you have not eaten for a while (fasting). You may have this done every 1-3 years. Bone density scan. This is done to screen for osteoporosis. You may have this done starting at age 11. Mammogram. This may be done every 1-2 years. Talk to your health care provider about how often you should have regular  mammograms. Talk with your health care provider about your test results, treatment options, and if necessary, the need for more tests. Vaccines  Your health care provider may recommend certain vaccines, such as: Influenza vaccine. This is recommended every year. Tetanus, diphtheria, and acellular pertussis (Tdap, Td) vaccine. You may need a Td booster every 10 years. Zoster vaccine. You may need this after age 58. Pneumococcal 13-valent conjugate (PCV13) vaccine. One dose is recommended after age 20. Pneumococcal polysaccharide (PPSV23) vaccine. One dose is recommended after age 61. Talk to your health care provider about which screenings and vaccines you need and how often you need them. This information is not intended to replace advice given to you by your health care provider. Make sure you discuss any questions you have with your health care provider. Document Released: 12/07/2015 Document Revised: 07/30/2016 Document Reviewed: 09/11/2015 Elsevier Interactive Patient Education  2017 [place] Prevention in the Home Falls can cause injuries. They can happen to people of all ages. There are many things you can do to make your home safe and to help prevent falls. What can I do on the outside of my home? Regularly fix the edges of walkways and driveways and fix any cracks. Remove anything that might make you trip as you walk through a door, such as a raised step or threshold. Trim any bushes or trees on the path to your home. Use bright outdoor lighting. Clear any walking paths of anything that might make someone trip, such as rocks or tools. Regularly check to see if handrails are loose or broken. Make sure that both sides of any steps have handrails. Any raised decks and porches should have guardrails on the edges. Have any leaves, snow, or ice cleared regularly. Use sand or salt on walking paths during winter. Clean up any spills in your garage right away. This includes oil  or grease spills. What can I do in the bathroom? Use night lights. Install grab bars by the toilet and in the tub and shower. Do not use towel bars as grab bars. Use non-skid mats or decals in the tub or shower. If you need to sit down in the shower, use a plastic, non-slip stool. Keep the floor dry. Clean up any water that spills on the floor as soon as it happens. Remove soap buildup in the tub or shower regularly. Attach bath mats securely with double-sided non-slip rug tape. Do not have throw rugs and other things on the floor that can make you trip. What can I do in the bedroom? Use night lights. Make sure that you have a light by your bed that is easy to reach. Do not use any sheets or blankets that are too big for your bed. They should not hang down onto the floor. Have a firm chair that has side arms. You can use this for support while you get dressed. Do not have throw rugs and other things on the floor that can make you trip. What can I do in the kitchen? Clean up any spills right away. Avoid walking on wet floors. Keep items that you use  a lot in easy-to-reach places. If you need to reach something above you, use a strong step stool that has a grab bar. Keep electrical cords out of the way. Do not use floor polish or wax that makes floors slippery. If you must use wax, use non-skid floor wax. Do not have throw rugs and other things on the floor that can make you trip. What can I do with my stairs? Do not leave any items on the stairs. Make sure that there are handrails on both sides of the stairs and use them. Fix handrails that are broken or loose. Make sure that handrails are as long as the stairways. Check any carpeting to make sure that it is firmly attached to the stairs. Fix any carpet that is loose or worn. Avoid having throw rugs at the top or bottom of the stairs. If you do have throw rugs, attach them to the floor with carpet tape. Make sure that you have a light  switch at the top of the stairs and the bottom of the stairs. If you do not have them, ask someone to add them for you. What else can I do to help prevent falls? Wear shoes that: Do not have high heels. Have rubber bottoms. Are comfortable and fit you well. Are closed at the toe. Do not wear sandals. If you use a stepladder: Make sure that it is fully opened. Do not climb a closed stepladder. Make sure that both sides of the stepladder are locked into place. Ask someone to hold it for you, if possible. Clearly mark and make sure that you can see: Any grab bars or handrails. First and last steps. Where the edge of each step is. Use tools that help you move around (mobility aids) if they are needed. These include: Canes. Walkers. Scooters. Crutches. Turn on the lights when you go into a dark area. Replace any light bulbs as soon as they burn out. Set up your furniture so you have a clear path. Avoid moving your furniture around. If any of your floors are uneven, fix them. If there are any pets around you, be aware of where they are. Review your medicines with your doctor. Some medicines can make you feel dizzy. This can increase your chance of falling. Ask your doctor what other things that you can do to help prevent falls. This information is not intended to replace advice given to you by your health care provider. Make sure you discuss any questions you have with your health care provider. Document Released: 09/06/2009 Document Revised: 04/17/2016 Document Reviewed: 12/15/2014 Elsevier Interactive Patient Education  2017 [employer].

## 2022-09-15 ENCOUNTER — Other Ambulatory Visit: Payer: Self-pay | Admitting: Oncology

## 2022-09-15 DIAGNOSIS — C50912 Malignant neoplasm of unspecified site of left female breast: Secondary | ICD-10-CM

## 2022-10-07 ENCOUNTER — Other Ambulatory Visit: Payer: Self-pay | Admitting: Family

## 2022-10-07 ENCOUNTER — Other Ambulatory Visit: Payer: Self-pay | Admitting: Oncology

## 2022-10-07 DIAGNOSIS — E1165 Type 2 diabetes mellitus with hyperglycemia: Secondary | ICD-10-CM

## 2022-10-07 DIAGNOSIS — C50912 Malignant neoplasm of unspecified site of left female breast: Secondary | ICD-10-CM

## 2022-10-14 ENCOUNTER — Inpatient Hospital Stay: Payer: PPO

## 2022-10-24 ENCOUNTER — Inpatient Hospital Stay: Payer: PPO | Attending: Radiation Oncology

## 2022-10-24 DIAGNOSIS — Z452 Encounter for adjustment and management of vascular access device: Secondary | ICD-10-CM | POA: Insufficient documentation

## 2022-10-24 DIAGNOSIS — C50912 Malignant neoplasm of unspecified site of left female breast: Secondary | ICD-10-CM | POA: Diagnosis not present

## 2022-10-24 DIAGNOSIS — Z95828 Presence of other vascular implants and grafts: Secondary | ICD-10-CM

## 2022-10-24 MED ORDER — HEPARIN SOD (PORK) LOCK FLUSH 100 UNIT/ML IV SOLN
500.0000 [IU] | Freq: Once | INTRAVENOUS | Status: AC
  Administered 2022-10-24: 500 [IU] via INTRAVENOUS
  Filled 2022-10-24: qty 5

## 2022-10-24 MED ORDER — SODIUM CHLORIDE 0.9% FLUSH
10.0000 mL | INTRAVENOUS | Status: DC | PRN
  Administered 2022-10-24: 10 mL via INTRAVENOUS
  Filled 2022-10-24: qty 10

## 2022-10-31 ENCOUNTER — Telehealth: Payer: Self-pay

## 2022-10-31 ENCOUNTER — Other Ambulatory Visit: Payer: Self-pay

## 2022-10-31 DIAGNOSIS — E1165 Type 2 diabetes mellitus with hyperglycemia: Secondary | ICD-10-CM

## 2022-10-31 MED ORDER — FREESTYLE LIBRE 2 SENSOR MISC: 1 refills | Status: DC

## 2022-10-31 NOTE — Telephone Encounter (Signed)
Pt called needing refill on free style libre:   Tammy Nunez P4 hours ago (10:52 AM)    Patient states she needs a refill for her Continuous Blood Gluc Sensor (FREESTYLE LIBRE 2 SENSOR) MISC.    Patient states her preferred pharmacy is [facility] on [address]        Refills sent to preferred pharmacy.

## 2022-10-31 NOTE — Addendum Note (Signed)
Addended by: Gracy Racer on: 10/31/2022 03:27 PM   Modules accepted: Orders

## 2022-10-31 NOTE — Telephone Encounter (Signed)
Patient states she needs a refill for her Continuous Blood Gluc Sensor (FREESTYLE LIBRE 2 SENSOR) MISC.   Patient states her preferred pharmacy is [facility] on [address]

## 2022-10-31 NOTE — Telephone Encounter (Signed)
Refill sent documentation sent to [facility] FNP

## 2022-11-06 ENCOUNTER — Ambulatory Visit
Admission: RE | Admit: 2022-11-06 | Discharge: 2022-11-06 | Disposition: A | Discharge status: Home / Self Care | Payer: PPO | Source: Ambulatory Visit | Attending: Oncology | Admitting: Oncology

## 2022-11-06 DIAGNOSIS — C50919 Malignant neoplasm of unspecified site of unspecified female breast: Secondary | ICD-10-CM | POA: Insufficient documentation

## 2022-11-06 DIAGNOSIS — Z1231 Encounter for screening mammogram for malignant neoplasm of breast: Secondary | ICD-10-CM | POA: Insufficient documentation

## 2022-12-02 ENCOUNTER — Other Ambulatory Visit: Payer: Self-pay

## 2022-12-02 NOTE — Telephone Encounter (Signed)
error 

## 2022-12-09 ENCOUNTER — Inpatient Hospital Stay: Payer: PPO | Attending: Radiation Oncology

## 2022-12-09 ENCOUNTER — Other Ambulatory Visit: Payer: Self-pay

## 2022-12-09 DIAGNOSIS — Z853 Personal history of malignant neoplasm of breast: Secondary | ICD-10-CM | POA: Insufficient documentation

## 2022-12-09 DIAGNOSIS — Z95828 Presence of other vascular implants and grafts: Secondary | ICD-10-CM

## 2022-12-09 DIAGNOSIS — Z452 Encounter for adjustment and management of vascular access device: Secondary | ICD-10-CM | POA: Diagnosis present

## 2022-12-09 MED ORDER — SODIUM CHLORIDE 0.9% FLUSH
10.0000 mL | Freq: Once | INTRAVENOUS | Status: AC
  Administered 2022-12-09: 10 mL via INTRAVENOUS
  Filled 2022-12-09: qty 10

## 2022-12-09 MED ORDER — HEPARIN SOD (PORK) LOCK FLUSH 100 UNIT/ML IV SOLN
500.0000 [IU] | Freq: Once | INTRAVENOUS | Status: AC
  Administered 2022-12-09: 500 [IU] via INTRAVENOUS
  Filled 2022-12-09: qty 5

## 2022-12-12 ENCOUNTER — Telehealth: Payer: Self-pay | Admitting: Family Medicine

## 2022-12-12 NOTE — Telephone Encounter (Signed)
Patient is requesting a refill on her Metformin. She has a TOC on 1/25 with Volanda Napoleon.

## 2022-12-15 ENCOUNTER — Ambulatory Visit (INDEPENDENT_AMBULATORY_CARE_PROVIDER_SITE_OTHER): Payer: PPO | Admitting: Nurse Practitioner

## 2022-12-15 ENCOUNTER — Ambulatory Visit (INDEPENDENT_AMBULATORY_CARE_PROVIDER_SITE_OTHER): Payer: PPO

## 2022-12-15 ENCOUNTER — Encounter (INDEPENDENT_AMBULATORY_CARE_PROVIDER_SITE_OTHER): Payer: Self-pay | Admitting: Nurse Practitioner

## 2022-12-15 VITALS — BP 149/82 | HR 70 | Resp 15

## 2022-12-15 DIAGNOSIS — I63239 Cerebral infarction due to unspecified occlusion or stenosis of unspecified carotid arteries: Secondary | ICD-10-CM | POA: Diagnosis not present

## 2022-12-15 DIAGNOSIS — E1165 Type 2 diabetes mellitus with hyperglycemia: Secondary | ICD-10-CM

## 2022-12-15 DIAGNOSIS — I10 Essential (primary) hypertension: Secondary | ICD-10-CM

## 2022-12-15 DIAGNOSIS — E782 Mixed hyperlipidemia: Secondary | ICD-10-CM | POA: Diagnosis not present

## 2022-12-15 NOTE — Progress Notes (Signed)
Subjective:    Patient ID: Tammy Nunez, female    DOB: 1951-07-03, 72 y.o.   MRN: [id number] Chief Complaint  Patient presents with   Follow-up    Ultrasound follow up    The patient is seen for follow up evaluation of carotid stenosis. The carotid stenosis followed by ultrasound.   The patient denies amaurosis fugax. There is no recent history of TIA symptoms or focal motor deficits. There is a prior documented CVA but not recently.  The patient had an ICA stent placed in 2020.  The patient is taking enteric-coated aspirin 81 mg daily.  There is no history of migraine headaches. There is no history of seizures.  No recent shortening of the patient's walking distance or new symptoms consistent with claudication.  No history of rest pain symptoms. No new ulcers or wounds of the lower extremities have occurred.  There is no history of DVT, PE or superficial thrombophlebitis. No recent episodes of angina or shortness of breath documented.   Carotid Duplex done today shows 1-39% stenosis bilaterally.  Patent right ICA stent.  Antegrade flow in the bilateral vertebral arteries.  No evidence of significant subclavian stenosis.  No change compared to last study in 2021    Review of Systems  Neurological:  Positive for weakness.  All other systems reviewed and are negative.      Objective:   Physical Exam Vitals reviewed.  HENT:     Head: Normocephalic.  Neck:     Vascular: No carotid bruit.  Cardiovascular:     Rate and Rhythm: Normal rate.  Pulmonary:     Effort: Pulmonary effort is normal.  Skin:    General: Skin is warm and dry.  Neurological:     Mental Status: She is alert and oriented to person, place, and time.     Motor: Weakness present.     Gait: Gait abnormal.  Psychiatric:        Mood and Affect: Mood normal.        Behavior: Behavior normal.        Thought Content: Thought content normal.        Judgment: Judgment normal.     BP (!) 149/82 (BP  Location: Right Arm)   Pulse 70   Resp 15   Past Medical History:  Diagnosis Date   Anemia    Breast cancer ([place]) 02/17/2022   Breast cancer in female [facility]) 05/08/2021   Chronic left shoulder pain    Coronary artery disease    x 1 stent   Diabetes mellitus without complication ([place])    Dysrhythmia    Hypertension    Hypomagnesemia 09/13/2021   Paralysis (HCC)    weakness left upper amd lower extremity   PONV (postoperative nausea and vomiting)    Stroke [facility])     Social History   Socioeconomic History   Marital status: Married    Spouse name: Izell [place]   Number of children: Not on file   Years of education: Not on file   Highest education level: Not on file  Occupational History   Not on file  Tobacco Use   Smoking status: Former    Packs/day: 0.50    Years: 15.00    Total pack years: 7.50    Types: Cigarettes    Quit date: 04/11/2019    Years since quitting: 3.6   Smokeless tobacco: Never  Vaping Use   Vaping Use: Never used  Substance and Sexual Activity   Alcohol use:  Never   Drug use: Never   Sexual activity: Not Currently  Other Topics Concern   Not on file  Social History Narrative   No kids    Married with husband    Used to work [employer] broadcast assistant    Social Determinants of Health   Financial Resource Strain: Low Risk  (09/04/2022)   Overall Financial Resource Strain (CARDIA)    Difficulty of Paying Living Expenses: Not hard at all  Food Insecurity: No Food Insecurity (09/04/2022)   Hunger Vital Sign    Worried About Running Out of Food in the Last Year: Never true    Ran Out of Food in the Last Year: Never true  Transportation Needs: No Transportation Needs (09/04/2022)   PRAPARE - Hydrologist (Medical): No    Lack of Transportation (Non-Medical): No  Physical Activity: Sufficiently Active (09/04/2022)   Exercise Vital Sign    Days of Exercise per Week: 7 days    Minutes of Exercise per Session: 30  min  Stress: No Stress Concern Present (09/04/2022)   [place]    Feeling of Stress : Not at all  Social Connections: [place] (09/04/2022)   Social Connection and Isolation Panel [NHANES]    Frequency of Communication with Friends and Family: Three times a week    Frequency of Social Gatherings with Friends and Family: Once a week    Attends Religious Services: 1 to 4 times per year    Active Member of [employer] or Organizations: Yes    Attends Archivist Meetings: 1 to 4 times per year    Marital Status: Married  Human resources officer Violence: Not At Risk (09/04/2022)   Humiliation, Afraid, Rape, and Kick questionnaire    Fear of Current or Ex-Partner: No    Emotionally Abused: No    Physically Abused: No    Sexually Abused: No    Past Surgical History:  Procedure Laterality Date   BREAST BIOPSY Left 05/07/2021   12:00 5cmfn vision - INVASIVE [facility]MARY CARCINOMA,   BREAST BIOPSY Left 05/07/2021   11:00 10 cmfn mini cork clip [facility]IVE [facility],   CAROTID PTA/STENT INTERVENTION Left 07/20/2019   Procedure: CAROTID PTA/STENT INTERVENTION;  Surgeon: Katha Cabal, MD;  Location: [facility];  Service: Cardiovascular;  Laterality: Left;   ECTOPIC PREGNANCY SURGERY     PORTACATH PLACEMENT N/A 06/06/2021   Procedure: INSERTION PORT-A-CATH;  Surgeon: Herbert Pun, MD;  Location: [facility];  Service: General;  Laterality: N/A;   SENTINEL NODE BIOPSY Left 02/17/2022   Procedure: SENTINEL NODE BIOPSY;  Surgeon: Herbert Pun, MD;  Location: [facility];  Service: General;  Laterality: Left;   TOTAL MASTECTOMY Left 02/17/2022   Procedure: TOTAL MASTECTOMY;  Surgeon: Herbert Pun, MD;  Location: [facility];  Service: General;  Laterality: Left;    Family History  Problem Relation Age of Onset   Diabetes type II Mother    Hypertension Father    Heart attack Father     Diabetes type II Brother    Breast cancer Neg Hx     No Known Allergies     Latest Ref Rng & Units 09/02/2022   12:38 PM 04/29/2022    9:34 AM 04/14/2022    2:31 PM  CBC  WBC 4.0 - 10.5 K/uL 5.5  4.3  5.6   Hemoglobin 12.0 - 15.0 g/dL 13.9  12.8  12.9   Hematocrit 36.0 -  46.0 % 41.0  38.7  39.0   Platelets 150 - 400 K/uL 159  153  171       CMP     Component Value Date/Time   NA 139 09/02/2022 1238   K 4.1 09/02/2022 1238   CL 108 09/02/2022 1238   CO2 23 09/02/2022 1238   GLUCOSE 114 (H) 09/02/2022 1238   BUN 26 (H) 09/02/2022 1238   CREATININE 0.81 09/02/2022 1238   CALCIUM 10.3 09/02/2022 1238   PROT 7.1 09/02/2022 1238   ALBUMIN 4.3 09/02/2022 1238   AST 22 09/02/2022 1238   ALT 20 09/02/2022 1238   ALKPHOS 56 09/02/2022 1238   BILITOT 0.6 09/02/2022 1238   GFRNONAA >60 09/02/2022 1238   GFRAA >60 07/21/2019 0501     No results found.     Assessment & Plan:   1. Carotid stenosis, symptomatic, with infarction [facility]) Recommend:  Given the patient's asymptomatic subcritical stenosis no further invasive testing or surgery at this time.  Duplex ultrasound shows 1 to 39% stenosis bilaterally with a patent right ICA stent  Continue antiplatelet therapy as prescribed Continue management of CAD, HTN and Hyperlipidemia Healthy heart diet,  encouraged exercise at least 4 times per week Follow up in 12 months with duplex ultrasound and physical exam   2. Essential hypertension Continue antihypertensive medications as already ordered, these medications have been reviewed and there are no changes at this time.  3. Type 2 diabetes mellitus with hyperglycemia, without long-term current use of insulin (HCC) Continue hypoglycemic medications as already ordered, these medications have been reviewed and there are no changes at this time.  Hgb A1C to be monitored as already arranged by primary service  4. Mixed hyperlipidemia Continue statin as ordered and reviewed, no  changes at this time   Current Outpatient Medications on File Prior to Visit  Medication Sig Dispense Refill   amLODipine (NORVASC) 5 MG tablet Take 1 tablet (5 mg total) by mouth daily. 90 tablet 3   anastrozole (ARIMIDEX) 1 MG tablet Take 1 tablet (1 mg total) by mouth daily. 30 tablet 5   aspirin 81 MG EC tablet Take 1 tablet (81 mg total) by mouth daily. 90 tablet 3   atorvastatin (LIPITOR) 80 MG tablet Take 1 tablet (80 mg total) by mouth daily. 90 tablet 3   blood glucose meter kit and supplies 1 each by Other route 4 (four) times daily. Dispense based on patient and insurance preference. Four times daily as directed. (FOR ICD-10 E10.9, E11.9). 1 each 0   calcium carbonate (OS-CAL) 600 MG tablet Take 2 tablets (1,200 mg total) by mouth daily. 180 tablet 3   carvedilol (COREG) 25 MG tablet Take 1 tablet (25 mg total) by mouth 2 (two) times daily with a meal. 180 tablet 3   clopidogrel (PLAVIX) 75 MG tablet Take 1 tablet (75 mg total) by mouth daily. 90 tablet 3   Continuous Blood Gluc Sensor (FREESTYLE LIBRE 2 SENSOR) MISC USE TO CHECK BLOOD SUGAR AT LEAST EVERY 8 HOURS 6 each 1   empagliflozin (JARDIANCE) 25 MG TABS tablet Take 1 tablet (25 mg total) by mouth daily. 90 tablet 3   lidocaine-prilocaine (EMLA) cream APPLY  CREAM TOPICALLY TO AFFECTED AREA AS NEEDED PRIOR TO PORT ACCESS. 30 g 1   lisinopril (ZESTRIL) 40 MG tablet Take 1 tablet (40 mg total) by mouth daily. 90 tablet 3   loperamide (IMODIUM) 2 MG capsule Take 1 capsule (2 mg total) by mouth See admin  instructions. Take '4mg'$  at the onset of diarrhea, and then '2mg'$  after each loose bowel movement, maximum '16mg'$  per 24 hours. 30 capsule 1   loratadine (CLARITIN) 10 MG tablet Take 1 tablet (10 mg total) by mouth daily. 90 tablet 3   Multiple Vitamin (MULTIVITAMIN) tablet Take 1 tablet by mouth daily.     potassium chloride SA (KLOR-CON M20) 20 MEQ tablet Take 1 tablet (20 mEq total) by mouth daily. Clarify its 1x per day 90 tablet 3    vitamin B-12 (CYANOCOBALAMIN) 1000 MCG tablet Take 1 tablet (1,000 mcg total) by mouth daily. 30 tablet 1   Vitamin D, Cholecalciferol, 25 MCG (1000 UT) TABS Take 1,000 Units by mouth.     HYDROcodone-acetaminophen (NORCO/VICODIN) 5-325 MG tablet Take 1 tablet by mouth every 4 (four) hours as needed for moderate pain. (Patient not taking: Reported on 09/04/2022) 16 tablet 0   [DISCONTINUED] prochlorperazine (COMPAZINE) 10 MG tablet Take 1 tablet (10 mg total) by mouth every 6 (six) hours as needed (Nausea or vomiting). 30 tablet 1   Current Facility-Administered Medications on File Prior to Visit  Medication Dose Route Frequency Provider Last Rate Last Admin   heparin lock flush 100 UNIT/ML injection             There are no Patient Instructions on file for this visit. No follow-ups on file.   Kris Hartmann, NP

## 2022-12-16 NOTE — Telephone Encounter (Signed)
Lm for pt to cb.

## 2022-12-16 NOTE — Telephone Encounter (Signed)
Pt advised per Santiago Glad can wait until appt to discuss starting metformin/medication

## 2022-12-18 ENCOUNTER — Encounter: Payer: Self-pay | Admitting: Family Medicine

## 2022-12-18 ENCOUNTER — Ambulatory Visit (INDEPENDENT_AMBULATORY_CARE_PROVIDER_SITE_OTHER): Payer: PPO | Admitting: Family Medicine

## 2022-12-18 VITALS — BP 130/80 | HR 73 | Temp 98.7°F | Ht 61.5 in | Wt 152.8 lb

## 2022-12-18 DIAGNOSIS — I63239 Cerebral infarction due to unspecified occlusion or stenosis of unspecified carotid arteries: Secondary | ICD-10-CM

## 2022-12-18 DIAGNOSIS — E559 Vitamin D deficiency, unspecified: Secondary | ICD-10-CM

## 2022-12-18 DIAGNOSIS — I152 Hypertension secondary to endocrine disorders: Secondary | ICD-10-CM

## 2022-12-18 DIAGNOSIS — E1159 Type 2 diabetes mellitus with other circulatory complications: Secondary | ICD-10-CM

## 2022-12-18 DIAGNOSIS — E1165 Type 2 diabetes mellitus with hyperglycemia: Secondary | ICD-10-CM | POA: Diagnosis not present

## 2022-12-18 DIAGNOSIS — Z95828 Presence of other vascular implants and grafts: Secondary | ICD-10-CM

## 2022-12-18 DIAGNOSIS — R569 Unspecified convulsions: Secondary | ICD-10-CM

## 2022-12-18 DIAGNOSIS — I10 Essential (primary) hypertension: Secondary | ICD-10-CM

## 2022-12-18 DIAGNOSIS — E782 Mixed hyperlipidemia: Secondary | ICD-10-CM

## 2022-12-18 DIAGNOSIS — I6529 Occlusion and stenosis of unspecified carotid artery: Secondary | ICD-10-CM

## 2022-12-18 DIAGNOSIS — Z79811 Long term (current) use of aromatase inhibitors: Secondary | ICD-10-CM

## 2022-12-18 DIAGNOSIS — I639 Cerebral infarction, unspecified: Secondary | ICD-10-CM | POA: Diagnosis not present

## 2022-12-18 DIAGNOSIS — E1169 Type 2 diabetes mellitus with other specified complication: Secondary | ICD-10-CM | POA: Diagnosis not present

## 2022-12-18 DIAGNOSIS — E785 Hyperlipidemia, unspecified: Secondary | ICD-10-CM

## 2022-12-18 DIAGNOSIS — G8194 Hemiplegia, unspecified affecting left nondominant side: Secondary | ICD-10-CM

## 2022-12-18 MED ORDER — CLOPIDOGREL BISULFATE 75 MG PO TABS: 75.0000 mg | ORAL_TABLET | Freq: Every day | ORAL | 3 refills | Status: DC

## 2022-12-18 NOTE — Progress Notes (Signed)
SUBJECTIVE:   Chief Complaint  Patient presents with   Transitions Of Care   HPI Patient presents to clinic with husband to transfer care.  Hypertension Asymptomatic. Currently taking Amlodipine 10 mg daily, Lisinopril 40 mg daily and Coreg 25 mg twice daily.  Tolerating medications well.  History of CVA Mild left-sided deficit.  Originally followed with Dr. Melrose Nakayama however has not been followed by neurology in quite some time.  Uses cane to help with ambulation.  Reports blood pressure well-controlled at home.  Currently on Plavix 75 mg, aspirin 81 mg daily and statin therapy.  Denies any bleeding.  Requesting refills on Plavix.  Diabetes Asymptomatic.  CBGs at home 112.  Currently taking metformin 500 mg twice daily and Jardiance 25 mg daily.  Reports ran out of metformin 2 to 3 weeks ago.  On ACE and statin therapy  History of breast cancer. Currently on Arimidex and followed by Dr. Tasia Catchings, oncology   Carotid stenosis Asymptomatic.  Follows with vascular surgery.  Currently on DAPT and statin therapy.    PERTINENT PMH / PSH: Hypertension Diabetes type 2 CAD CVA Left carotid stenosis status post stent Hyperlipidemia Port-A-Cath History of left breast cancer Left breast mastectomy  OBJECTIVE:  BP 130/80   Pulse 73   Temp 98.7 F (37.1 C) (Oral)   Ht 5' 1.5" (1.562 m)   Wt 152 lb 12.8 oz (69.3 kg)   SpO2 97%   BMI 28.40 kg/m    Physical Exam Vitals reviewed.  Constitutional:      General: She is not in acute distress.    Appearance: She is normal weight. She is not ill-appearing.  HENT:     Head: Normocephalic.  Eyes:     Conjunctiva/sclera: Conjunctivae normal.  Neck:     Thyroid: No thyromegaly or thyroid tenderness.  Cardiovascular:     Rate and Rhythm: Normal rate and regular rhythm.     Pulses: Normal pulses.  Pulmonary:     Effort: Pulmonary effort is normal.     Breath sounds: Normal breath sounds.  Abdominal:     General: Bowel sounds are  normal.  Neurological:     Mental Status: She is alert. Mental status is at baseline.  Psychiatric:        Mood and Affect: Mood normal.        Behavior: Behavior normal.        Thought Content: Thought content normal.        Judgment: Judgment normal.     ASSESSMENT/PLAN:  Type 2 diabetes mellitus with hyperglycemia, without long-term current use of insulin (HCC) Assessment & Plan: Chronic.  Stable.  Asymptomatic.  Has run out of metformin 2 weeks ago.  Per chart review patient was to discontinue metformin in January 2023 secondary to chemotherapy induced nausea. Repeat A1c Continue Jardiance 25 mg daily Consider reinitiation of metformin pending A1c results. On statin and ACE inhibitor Foot exam at next visit Encouraged yearly eye exam  Orders: -     Hemoglobin A1c; Future  Hypertension associated with diabetes [facility]) Assessment & Plan: Chronic.  Stable.  Well-controlled per JNC 8 guidelines less than 140/90 for age Continue amlodipine 10 mg daily Continue lisinopril 40 mg daily Continue carvedilol 25 mg twice daily Labs today  Orders: -     Comprehensive metabolic panel; Future  Hyperlipidemia associated with type 2 diabetes mellitus ([place]) Assessment & Plan: Chronic.  Stable.  Recent LDL not at goal less than 70.  On statin therapy.  No  myalgias Repeat fasting lipids Continue atorvastatin 80 mg daily  Orders: -     Lipid panel; Future  Cerebrovascular accident (CVA), unspecified mechanism ([place]) Assessment & Plan: Chronic.  Stable.  Mild left-sided weakness status post CVA. Maintain blood pressure less than 140/90 per JNC 8 guidelines for age Continue amlodipine 10 mg daily Continue lisinopril 40 mg daily Continue carvedilol 25 mg twice daily Refill Plavix Continue ambulation and physical exercise.  Orders: -     Clopidogrel Bisulfate; Take 1 tablet (75 mg total) by mouth daily.  Dispense: 90 tablet; Refill: 3  Left hemiparesis ([place]) Assessment &  Plan: Chronic.  Stable.  Mild left hemiparesis.    Vitamin D deficiency Assessment & Plan: Currently on vitamin D supplements and aromatase inhibitor. Check vitamin D levels  Orders: -     VITAMIN D 25 Hydroxy (Vit-D Deficiency, Fractures); Future  Hypomagnesemia Assessment & Plan: Recent magnesium level 1.7.  History of CAD Check mag   Orders: -     Magnesium; Future  Asymptomatic carotid artery stenosis, unspecified laterality Assessment & Plan: Chronic.  Stable.  Status post stent Continue DAPT and statin therapy Follows with vascular surgery. Refill Plavix   Orders: -     Clopidogrel Bisulfate; Take 1 tablet (75 mg total) by mouth daily.  Dispense: 90 tablet; Refill: 3  Aromatase inhibitor use Assessment & Plan: Tolerating Arimidex Follows with oncology.   Port-A-Cath in place Assessment & Plan: History of left breast mastectomy.  Flush every 8 weeks per oncology Follows with oncology    PDMP reviewed  Return in about 2 months (around 02/16/2023) for LAB, PCP.  Carollee Leitz, MD

## 2022-12-18 NOTE — Patient Instructions (Signed)
It was a pleasure meeting you today. Thank you for allowing me to take part in your health care.  Our goals for today as we discussed include:  Fill sent for Plavix 75 mg daily  Metformin was previously discontinued in January 2023. Recheck your A1c in March and can discuss restarting if needed.  Blood pressure remains at goal. Continue current blood pressure medications.  Please schedule appointment to follow-up in March for diabetic check. Schedule lab appointment 1 week prior to your visit.  Will need to fast for 12 hours.  Diabetic foot exam at next visit.  You are due for an eye exam.  Please make sure that you call your eye doctor to have this scheduled and have them fax the results to our office.  If you have any questions or concerns, please do not hesitate to call the office at [phone].  Recommend colonoscopy or Cologuard.  Please let me know if you decide you want to have this done.  Recommend Shingles vaccine.  This is a 2 dose series and can be given at your local pharmacy.  Please talk to your pharmacist about this.   Recommend Tetanus Vaccination.  This is given every 10 years.   I look forward to our next visit and until then take care and stay safe.  Regards,   Carollee Leitz, MD   [facility]

## 2022-12-21 ENCOUNTER — Encounter: Payer: Self-pay | Admitting: Family Medicine

## 2022-12-21 DIAGNOSIS — I6529 Occlusion and stenosis of unspecified carotid artery: Secondary | ICD-10-CM | POA: Insufficient documentation

## 2022-12-21 NOTE — Assessment & Plan Note (Signed)
Chronic.  Stable.  Mild left hemiparesis.

## 2022-12-21 NOTE — Assessment & Plan Note (Signed)
Chronic.  Stable.  Well-controlled per JNC 8 guidelines less than 140/90 for age Continue amlodipine 10 mg daily Continue lisinopril 40 mg daily Continue carvedilol 25 mg twice daily Labs today

## 2022-12-21 NOTE — Assessment & Plan Note (Addendum)
Chronic.  Stable.  Status post stent Continue DAPT and statin therapy Follows with vascular surgery. Refill Plavix

## 2022-12-21 NOTE — Assessment & Plan Note (Signed)
Tolerating Arimidex Follows with oncology.

## 2022-12-21 NOTE — Assessment & Plan Note (Signed)
Recent magnesium level 1.7.  History of CAD Check mag

## 2022-12-21 NOTE — Assessment & Plan Note (Signed)
Chronic.  Stable.  Asymptomatic.  Has run out of metformin 2 weeks ago.  Per chart review patient was to discontinue metformin in January 2023 secondary to chemotherapy induced nausea. Repeat A1c Continue Jardiance 25 mg daily Consider reinitiation of metformin pending A1c results. On statin and ACE inhibitor Foot exam at next visit Encouraged yearly eye exam

## 2022-12-21 NOTE — Assessment & Plan Note (Addendum)
History of left breast mastectomy.  Flush every 8 weeks per oncology Follows with oncology

## 2022-12-21 NOTE — Assessment & Plan Note (Addendum)
Chronic.  Stable.  Mild left-sided weakness status post CVA. Maintain blood pressure less than 140/90 per JNC 8 guidelines for age Continue amlodipine 10 mg daily Continue lisinopril 40 mg daily Continue carvedilol 25 mg twice daily Refill Plavix Continue ambulation and physical exercise.

## 2022-12-21 NOTE — Assessment & Plan Note (Signed)
Currently on vitamin D supplements and aromatase inhibitor. Check vitamin D levels

## 2022-12-21 NOTE — Assessment & Plan Note (Signed)
>>  ASSESSMENT AND PLAN FOR CEREBROVASCULAR ACCIDENT (CVA) (HCC) WRITTEN ON 12/21/2022  7:23 PM BY WALSH, TANYA, MD  Chronic.  Stable.  Mild left-sided weakness status post CVA. Maintain blood pressure less than 140/90 per JNC 8 guidelines for age Continue amlodipine  10 mg daily Continue lisinopril  40 mg daily Continue carvedilol  25 mg twice daily Refill Plavix  Continue ambulation and physical exercise.

## 2022-12-21 NOTE — Assessment & Plan Note (Signed)
Chronic.  Stable.  Recent LDL not at goal less than 70.  On statin therapy.  No myalgias Repeat fasting lipids Continue atorvastatin 80 mg daily

## 2022-12-25 ENCOUNTER — Encounter: Payer: Self-pay | Admitting: Family Medicine

## 2022-12-26 ENCOUNTER — Telehealth: Payer: Self-pay

## 2022-12-26 NOTE — Telephone Encounter (Signed)
Pt returning call

## 2022-12-26 NOTE — Telephone Encounter (Signed)
Called Patient but the lady that answered said the Patient was not home right now so I just let her know we will try to call her again on Monday.

## 2022-12-26 NOTE — Telephone Encounter (Signed)
Left Patient a message to call the office back regarding Metformin ER 500 mg.

## 2022-12-30 NOTE — Telephone Encounter (Signed)
Patient states she is not taking the Metformin. She also states she made a lab appointment for the blood work on 02/11/23.

## 2023-01-06 ENCOUNTER — Inpatient Hospital Stay (HOSPITAL_BASED_OUTPATIENT_CLINIC_OR_DEPARTMENT_OTHER): Payer: PPO | Admitting: Oncology

## 2023-01-06 ENCOUNTER — Encounter: Payer: Self-pay | Admitting: Oncology

## 2023-01-06 ENCOUNTER — Inpatient Hospital Stay: Payer: PPO | Attending: Radiation Oncology

## 2023-01-06 VITALS — BP 172/73 | HR 77 | Temp 97.5°F | Resp 18 | Wt 155.2 lb

## 2023-01-06 DIAGNOSIS — Z79811 Long term (current) use of aromatase inhibitors: Secondary | ICD-10-CM | POA: Insufficient documentation

## 2023-01-06 DIAGNOSIS — Z9012 Acquired absence of left breast and nipple: Secondary | ICD-10-CM | POA: Insufficient documentation

## 2023-01-06 DIAGNOSIS — C50919 Malignant neoplasm of unspecified site of unspecified female breast: Secondary | ICD-10-CM

## 2023-01-06 DIAGNOSIS — C50912 Malignant neoplasm of unspecified site of left female breast: Secondary | ICD-10-CM | POA: Diagnosis not present

## 2023-01-06 DIAGNOSIS — Z170 Estrogen receptor positive status [ER+]: Secondary | ICD-10-CM | POA: Diagnosis not present

## 2023-01-06 DIAGNOSIS — I10 Essential (primary) hypertension: Secondary | ICD-10-CM | POA: Diagnosis not present

## 2023-01-06 DIAGNOSIS — Z853 Personal history of malignant neoplasm of breast: Secondary | ICD-10-CM | POA: Diagnosis not present

## 2023-01-06 DIAGNOSIS — Z95828 Presence of other vascular implants and grafts: Secondary | ICD-10-CM

## 2023-01-06 DIAGNOSIS — Z87891 Personal history of nicotine dependence: Secondary | ICD-10-CM | POA: Diagnosis not present

## 2023-01-06 DIAGNOSIS — E119 Type 2 diabetes mellitus without complications: Secondary | ICD-10-CM | POA: Insufficient documentation

## 2023-01-06 LAB — CBC WITH DIFFERENTIAL/PLATELET
Abs Immature Granulocytes: 0.02 10*3/uL (ref 0.00–0.07)
Basophils Absolute: 0 10*3/uL (ref 0.0–0.1)
Basophils Relative: 1 %
Eosinophils Absolute: 0.1 10*3/uL (ref 0.0–0.5)
Eosinophils Relative: 2 %
HCT: 44 % (ref 36.0–46.0)
Hemoglobin: 14.5 g/dL (ref 12.0–15.0)
Immature Granulocytes: 0 %
Lymphocytes Relative: 25 %
Lymphs Abs: 1.4 10*3/uL (ref 0.7–4.0)
MCH: 31.8 pg (ref 26.0–34.0)
MCHC: 33 g/dL (ref 30.0–36.0)
MCV: 96.5 fL (ref 80.0–100.0)
Monocytes Absolute: 0.6 10*3/uL (ref 0.1–1.0)
Monocytes Relative: 10 %
Neutro Abs: 3.5 10*3/uL (ref 1.7–7.7)
Neutrophils Relative %: 62 %
Platelets: 164 10*3/uL (ref 150–400)
RBC: 4.56 MIL/uL (ref 3.87–5.11)
RDW: 13.8 % (ref 11.5–15.5)
WBC: 5.6 10*3/uL (ref 4.0–10.5)
nRBC: 0 % (ref 0.0–0.2)

## 2023-01-06 LAB — COMPREHENSIVE METABOLIC PANEL
ALT: 23 U/L (ref 0–44)
AST: 28 U/L (ref 15–41)
Albumin: 4.3 g/dL (ref 3.5–5.0)
Alkaline Phosphatase: 64 U/L (ref 38–126)
Anion gap: 12 (ref 5–15)
BUN: 14 mg/dL (ref 8–23)
CO2: 21 mmol/L — ABNORMAL LOW (ref 22–32)
Calcium: 9.5 mg/dL (ref 8.9–10.3)
Chloride: 104 mmol/L (ref 98–111)
Creatinine, Ser: 0.86 mg/dL (ref 0.44–1.00)
GFR, Estimated: >60 mL/min (ref >60)
Glucose, Bld: 202 mg/dL — ABNORMAL HIGH (ref 70–99)
Potassium: 3.9 mmol/L (ref 3.5–5.1)
Sodium: 137 mmol/L (ref 135–145)
Total Bilirubin: 0.5 mg/dL (ref 0.3–1.2)
Total Protein: 7.2 g/dL (ref 6.5–8.1)

## 2023-01-06 MED ORDER — ANASTROZOLE 1 MG PO TABS: 1.0000 mg | ORAL_TABLET | Freq: Every day | ORAL | 1 refills | Status: DC

## 2023-01-06 NOTE — Assessment & Plan Note (Addendum)
Left breast invasive carcinoma, multiple sites.  ER 90%, PR 11-50%, HER2-cT3 N0, Ki-67 was 20%.  neoadjuvant chemotherapy ddAC x 4 followed by weekly Taxol--followed left mastectomy ypT2 pN0- adjuvant RT due to pectoralis muscle abut - lymph node negative disease.no need for adjuvant abemaciclib Currently on endocrine therapy with Arimidex.  She tolerates well. Continue Arimidex, refills were sent.  annual screening mammogram of right breast. November 2024

## 2023-01-06 NOTE — Assessment & Plan Note (Signed)
recommend port flush every 8 weeks

## 2023-01-06 NOTE — Progress Notes (Signed)
Hematology/Oncology Progress note [phone] [phone]      Patient Care Team: Carollee Leitz, MD as PCP - General (Family Medicine) Theodore Demark, RN (Inactive) as Oncology Nurse Navigator  ASSESSMENT & PLAN:   Cancer Staging  Invasive carcinoma of breast [facility]) Staging form: Breast, AJCC 8th Edition - Clinical stage from 05/16/2021: Stage IIB (cT3, cN0, cM0, G3, ER+, PR+, HER2-) - Signed by [name], MD on 06/03/2021   Aromatase inhibitor use Recommend calcium 1200 mg daily with Vitamin D supplementation Baseline DEXA 2022 - normal- repeat in June 2024  Patient declined  adjuvant bisphosphonate treatment  Invasive carcinoma of breast ([place]) Left breast invasive carcinoma, multiple sites.  ER 90%, PR 11-50%, HER2-cT3 N0, Ki-67 was 20%.  neoadjuvant chemotherapy ddAC x 4 followed by weekly Taxol--followed left mastectomy ypT2 pN0- adjuvant RT due to pectoralis muscle abut - lymph node negative disease.no need for adjuvant abemaciclib Currently on endocrine therapy with Arimidex.  She tolerates well. Continue Arimidex, refills were sent.  annual screening mammogram of right breast. November 2024   Port-A-Cath in place recommend port flush every 8 weeks  Orders Placed This Encounter  Procedures   DG Bone Density    Standing Status:   Future    Standing Expiration Date:   01/07/2024    Order Specific Question:   Reason for Exam (SYMPTOM  OR DIAGNOSIS REQUIRED)    Answer:   history of breast cancer    Order Specific Question:   Preferred imaging location?    Answer:   [facility]   CBC with Differential/Platelet    Standing Status:   Future    Standing Expiration Date:   01/07/2024   Comprehensive metabolic panel    Standing Status:   Future    Standing Expiration Date:   01/06/2024   Cancer antigen 27.29    Standing Status:   Future    Standing Expiration Date:   01/07/2024   Cancer antigen 15-3    Standing Status:   Future    Standing  Expiration Date:   01/07/2024   Lab MD 4 months flex, cbc cmp All questions were answered. The patient knows to call the clinic with any problems, questions or concerns.  [name], MD, PhD [facility] Hematology Oncology 01/06/2023   CHIEF COMPLAINTS/REASON FOR VISIT:  multifocal left breast cancer  HISTORY OF PRESENTING ILLNESS:   Tammy Nunez is a  72 y.o.  female presents for follow up of multifocal left breast cancer.  Oncology summary listed below.  Oncology History  Breast cancer in female [facility]) (Resolved)  05/08/2021 Initial Diagnosis   Breast cancer in female [facility])   06/03/2021 - 06/03/2021 Chemotherapy         06/21/2021 - 01/21/2022 Chemotherapy   Patient is on Treatment Plan : BREAST ADJUVANT DOSE DENSE AC q14d / PACLitaxel q7d     Invasive carcinoma of breast ([place])  05/02/2021 Mammogram   left breast diagnostic mammogram and ultrasound showed multiple irregular mass in the left breast. Mammogram mass 1 irregular mass associated with distortion in the upper breast at middle depth, multiple adjacent and possible continuous irregular masses, conglomeration of masses measures at least 5.6 cm.  Associated pleomorphic calcifications extending at least 5 x 3.9 x 3.7 cm Mass 2, 1 cm mass in the upper breast at the middle to posterior depth. Mass 3, 5 mm irregular mass in the upper breast at posterior depth. In total, mass 1-mass 3 span at least 8.2 cm in anterior-posterior extent.  By ultrasound  mass 1 12:00 5 cm from nipple, 2.7 x 1.5 x 4.3 cm Mass 2, 12:00 12 cm from nipple, 0.9 x 0.9 x 0.5 cm Mass 3   12:00 14 cm from nipple, 0.4 x 0.4 x 0.4 cm-uses 2.4 cm superior to mass to Mass 4   11:00 No suspicious left axillary adenopathy.   05/07/2021 Initial Diagnosis   Invasive carcinoma of breast  - Ultrasound-guided breast biopsy Breast left 12:00 mass 5 cm from nipple biopsy showed invasive mammary carcinoma, no special type, grade 3, high-grade DCIS present, no LVI, ER  90%, PR 11-50%, HER2 negative by IHC Breast left 11:00 mass 10 cm from nipple biopsy showed invasive mammary carcinoma, no special type, grade 2, no DCIS/LVI identified.ER 90%, PR 11-50%, HER2 negative by [facility]     05/16/2021 Cancer Staging   Staging form: Breast, AJCC 8th Edition - Clinical stage from 05/16/2021: Stage IIB (cT3, cN0, cM0, G3, ER+, PR+, HER2-) - Signed by [name], MD on 06/03/2021 Stage prefix: Initial diagnosis Histologic grading system: 3 grade system   05/22/2021 Imaging   MRI breast showed that nonmass like malignancy spanning over 8 cm, abuting anterior aspect of pectoralis muscle. Discussed with Dr.Cintron and we agree that she will need mastectomy. There is concern of possible positive margin. I recommend neoadjuvant chemotherapy   06/06/2021 Miscellaneous   Medi port was placed by Dr.Cintron   06/13/2021 Echocardiogram   pre chemotherapy Echo. LVEF 60-65%.  Normal global longitudinal strain. Grade 1 diastolic dysfunction   [id number] - 10/25/2021 Chemotherapy   ddAC x 4 with GCSF followed by weekly Taxol x 12 Chemotherapy break due to side effects, UTI and the patient's preference.   02/17/2022 Surgery   Patient underwent left simple mastectomy, and a sentinel lymph node biopsy.  Pathology showed invasive mammary carcinoma, high-grade DCIS, 4 lymph nodes were sampled and were negative for cancer. ypT2 pN0, grade 3, multifocal invasive carcinoma.  All margins are negative for invasive carcinoma and DCIS. Ki-67 was 20%.   04/29/2022 - 06/03/2022 Radiation Therapy   Due to the initial mass potentially abutting the pectoralis muscle, she received adjuvant chest wall radiation   06/13/2022 -  Anti-estrogen oral therapy   Start on Arimidex 15m daily     INTERVAL HISTORY Tammy Nunez a 72y.o. female who has above history reviewed by me today presents for follow up visit for  multifocal left breast cancer Patient reports reports feeling well.  Patient tolerates  Arimidex well. Manageable hot flash  Review of Systems  Constitutional:  Negative for appetite change, chills, fatigue and fever.  HENT:   Negative for hearing loss and voice change.   Eyes:  Negative for eye problems.  Respiratory:  Negative for chest tightness and cough.   Cardiovascular:  Negative for chest pain.  Gastrointestinal:  Negative for abdominal distention, abdominal pain, blood in stool, diarrhea and nausea.  Endocrine: Negative for hot flashes.  Genitourinary:  Negative for difficulty urinating, dysuria and frequency.   Musculoskeletal:  Negative for arthralgias.  Skin:  Negative for itching and rash.  Neurological:  Negative for extremity weakness.  Hematological:  Negative for adenopathy. Does not bruise/bleed easily.  Psychiatric/Behavioral:  Negative for confusion.     MEDICAL HISTORY:  Past Medical History:  Diagnosis Date   [place]    Anemia    Annual physical exam 10/09/2021   Breast cancer ([place] 02/17/2022   Breast cancer ([place] 02/17/2022   Breast cancer in female (  [place]) 05/08/2021   Breast cancer in female [facility]) 05/08/2021   Surgery appt sch 05/09/21 Dr. Loetta [facility]         DIAGNOSIS:  A. BREAST, LEFT, 12 O'CLOCK, 5 CM FROM NIPPLE; ULTRASOUND-GUIDED CORE  BIOPSY:  - INVASIVE MAMMARY CARCINOMA, NO SPECIAL TYPE.  Size of invasive carcinoma: 2.5 mm in this sample  Histologic grade of invasive carcinoma: Grade 3                       Glandular/tubular differentiation score: 3                       Nu   Carotid stenosis, symptomatic, with infarction ([place]) 07/20/2019   Chemotherapy induced nausea and vomiting 06/28/2021   Chronic left shoulder pain    Coronary artery disease    x 1 stent   Diabetes mellitus without complication ([place])    Dysrhythmia    Essential hypertension    Hypertension    Hypomagnesemia 09/13/2021   Invasive carcinoma of breast ([place]) 05/08/2021   New onset type 2 diabetes mellitus ([place])     Numbness 06/03/2021   Obesity, diabetes, and hypertension syndrome ([place]) 10/11/2020   Paralysis ([place])    weakness left upper amd lower extremity   PONV (postoperative nausea and vomiting)    Seizures ([place])    Stroke [facility])     SURGICAL HISTORY: Past Surgical History:  Procedure Laterality Date   BREAST BIOPSY Left 05/07/2021   12:00 5cmfn vision - INVASIVE MAMMARY CARCINOMA,   BREAST BIOPSY Left 05/07/2021   11:00 10 cmfn mini cork clip INVASIVE MAMMARY CARCINOMA,   CAROTID PTA/STENT INTERVENTION Left 07/20/2019   Procedure: CAROTID PTA/STENT INTERVENTION;  Surgeon: Katha Cabal, MD;  Location: [place] [facility];  Service: Cardiovascular;  Laterality: Left;   ECTOPIC PREGNANCY SURGERY     PORTACATH PLACEMENT N/A 06/06/2021   Procedure: INSERTION PORT-A-CATH;  Surgeon: Herbert Pun, MD;  Location: [facility] ORS;  Service: General;  Laterality: N/A;   SENTINEL NODE BIOPSY Left 02/17/2022   Procedure: SENTINEL NODE BIOPSY;  Surgeon: Herbert Pun, MD;  Location: [facility] ORS;  Service: General;  Laterality: Left;   TOTAL MASTECTOMY Left 02/17/2022   Procedure: TOTAL MASTECTOMY;  Surgeon: Herbert Pun, MD;  Location: [facility] ORS;  Service: General;  Laterality: Left;    SOCIAL HISTORY: Social History   Socioeconomic History   Marital status: Married    Spouse name: Izell Sheridan   Number of children: Not on file   Years of education: Not on file   Highest education level: Not on file  Occupational History   Not on file  Tobacco Use   Smoking status: Former    Packs/day: 0.50    Years: 15.00    Total pack years: 7.50    Types: Cigarettes    Quit date: 04/11/2019    Years since quitting: 3.7   Smokeless tobacco: Never  Vaping Use   Vaping Use: Never used  Substance and Sexual Activity   Alcohol use: Never   Drug use: Never   Sexual activity: Not Currently  Other Topics Concern   Not on file  Social History Narrative   No kids    Married with husband     Used to work [employer] broadcast assistant    Social Determinants of Health   Financial Resource Strain: Low Risk  (09/04/2022)   Overall Financial Resource Strain (CARDIA)    Difficulty of Paying Living Expenses:  Not hard at all  Food Insecurity: No Food Insecurity (09/04/2022)   Hunger Vital Sign    Worried About Running Out of Food in the Last Year: Never true    Ran Out of Food in the Last Year: Never true  Transportation Needs: No Transportation Needs (09/04/2022)   PRAPARE - Hydrologist (Medical): No    Lack of Transportation (Non-Medical): No  Physical Activity: Sufficiently Active (09/04/2022)   Exercise Vital Sign    Days of Exercise per Week: 7 days    Minutes of Exercise per Session: 30 min  Stress: No Stress Concern Present (09/04/2022)   [place]    Feeling of Stress : Not at all  Social Connections: [place] (09/04/2022)   Social Connection and Isolation Panel [NHANES]    Frequency of Communication with Friends and Family: Three times a week    Frequency of Social Gatherings with Friends and Family: Once a week    Attends Religious Services: 1 to 4 times per year    Active Member of [employer] or Organizations: Yes    Attends Archivist Meetings: 1 to 4 times per year    Marital Status: Married  Human resources officer Violence: Not At Risk (09/04/2022)   Humiliation, Afraid, Rape, and Kick questionnaire    Fear of Current or Ex-Partner: No    Emotionally Abused: No    Physically Abused: No    Sexually Abused: No    FAMILY HISTORY: Family History  Problem Relation Age of Onset   Diabetes type II Mother    Hypertension Father    Heart attack Father    Diabetes type II Brother    Breast cancer Neg Hx     ALLERGIES:  has No Known Allergies.  MEDICATIONS:  Current Outpatient Medications  Medication Sig Dispense Refill   amLODipine (NORVASC) 5  MG tablet Take 1 tablet (5 mg total) by mouth daily. 90 tablet 3   aspirin 81 MG EC tablet Take 1 tablet (81 mg total) by mouth daily. 90 tablet 3   atorvastatin (LIPITOR) 80 MG tablet Take 1 tablet (80 mg total) by mouth daily. 90 tablet 3   blood glucose meter kit and supplies 1 each by Other route 4 (four) times daily. Dispense based on patient and insurance preference. Four times daily as directed. (FOR ICD-10 E10.9, E11.9). 1 each 0   calcium carbonate (OS-CAL) 600 MG tablet Take 2 tablets (1,200 mg total) by mouth daily. 180 tablet 3   carvedilol (COREG) 25 MG tablet Take 1 tablet (25 mg total) by mouth 2 (two) times daily with a meal. 180 tablet 3   clopidogrel (PLAVIX) 75 MG tablet Take 1 tablet (75 mg total) by mouth daily. 90 tablet 3   Continuous Blood Gluc Sensor (FREESTYLE LIBRE 2 SENSOR) MISC USE TO CHECK BLOOD SUGAR AT LEAST EVERY 8 HOURS 6 each 1   empagliflozin (JARDIANCE) 25 MG TABS tablet Take 1 tablet (25 mg total) by mouth daily. 90 tablet 3   lidocaine-prilocaine (EMLA) cream APPLY  CREAM TOPICALLY TO AFFECTED AREA AS NEEDED PRIOR TO PORT ACCESS. 30 g 1   lisinopril (ZESTRIL) 40 MG tablet Take 1 tablet (40 mg total) by mouth daily. 90 tablet 3   loperamide (IMODIUM) 2 MG capsule Take 1 capsule (2 mg total) by mouth See admin instructions. Take 28m at the onset of diarrhea, and then 270mafter each loose bowel movement, maximum 1651m  per 24 hours. 30 capsule 1   loratadine (CLARITIN) 10 MG tablet Take 1 tablet (10 mg total) by mouth daily. 90 tablet 3   Multiple Vitamin (MULTIVITAMIN) tablet Take 1 tablet by mouth daily.     potassium chloride SA (KLOR-CON M20) 20 MEQ tablet Take 1 tablet (20 mEq total) by mouth daily. Clarify its 1x per day 90 tablet 3   vitamin B-12 (CYANOCOBALAMIN) 1000 MCG tablet Take 1 tablet (1,000 mcg total) by mouth daily. 30 tablet 1   Vitamin D, Cholecalciferol, 25 MCG (1000 UT) TABS Take 1,000 Units by mouth.     anastrozole (ARIMIDEX) 1 MG tablet Take  1 tablet (1 mg total) by mouth daily. 90 tablet 1   No current facility-administered medications for this visit.   Facility-Administered Medications Ordered in Other Visits  Medication Dose Route Frequency Provider Last Rate Last Admin   heparin lock flush 100 UNIT/ML injection              PHYSICAL EXAMINATION: ECOG PERFORMANCE STATUS: 2 - Symptomatic, <50% confined to bed Vitals:   01/06/23 0944  BP: (!) 172/73  Pulse: 77  Resp: 18  Temp: (!) 97.5 F (36.4 C)  SpO2: 100%    Filed Weights   01/06/23 0944  Weight: 155 lb 3.2 oz (70.4 kg)      Physical Exam Constitutional:      General: She is not in acute distress. HENT:     Head: Normocephalic and atraumatic.  Eyes:     General: No scleral icterus. Cardiovascular:     Rate and Rhythm: Normal rate and regular rhythm.     Heart sounds: Normal heart sounds.  Pulmonary:     Effort: Pulmonary effort is normal. No respiratory distress.     Breath sounds: No wheezing.  Abdominal:     General: Bowel sounds are normal. There is no distension.     Palpations: Abdomen is soft.  Musculoskeletal:        General: No deformity. Normal range of motion.     Cervical back: Normal range of motion and neck supple.  Skin:    General: Skin is warm and dry.     Findings: No erythema or rash.  Neurological:     Mental Status: She is alert and oriented to person, place, and time. Mental status is at baseline.     Comments: Chronic left upper and lower extremity weakness.  Psychiatric:        Mood and Affect: Mood normal.   Left breast mass palpable, similar size.    LABORATORY DATA:  I have reviewed the data as listed Lab Results  Component Value Date   WBC 5.6 01/06/2023   HGB 14.5 01/06/2023   HCT 44.0 01/06/2023   MCV 96.5 01/06/2023   PLT 164 01/06/2023   Recent Labs    04/29/22 0934 09/02/22 1238 01/06/23 0923  NA 140 139 137  K 3.7 4.1 3.9  CL 109 108 104  CO2 23 23 21*  GLUCOSE 102* 114* 202*  BUN 17 26*  14  CREATININE 0.70 0.81 0.86  CALCIUM 9.0 10.3 9.5  GFRNONAA >60 >60 >60  PROT 6.7 7.1 7.2  ALBUMIN 4.1 4.3 4.3  AST 26 22 28  $ ALT 34 20 23  ALKPHOS 41 56 64  BILITOT 0.5 0.6 0.5    Iron/TIBC/Ferritin/ %Sat    Component Value Date/Time   IRON 113 12/08/2019 1015   TIBC 317 12/08/2019 1015   FERRITIN 67 12/08/2019 1015  IRONPCTSAT 36 12/08/2019 1015       RADIOGRAPHIC STUDIES: I have personally reviewed the radiological images as listed and agreed with the findings in the report. VAS US CAROTID  Result Date: 12/22/2022 Carotid Arterial Duplex Study Patient Name:  LORIA AREHART  Date of Exam:   12/15/2022 Medical Rec #: [id number]        Accession #:    [id number] Date of Birth: 1951-01-19        Patient Gender: F Patient Age:   59 years Exam Location:  [facility] Procedure:      VAS US CAROTID Referring Phys: Hortencia Pilar --------------------------------------------------------------------------------  Indications:   Carotid artery disease and Right stent. Other Factors: 06/2019 Placement of a 8 x 6 x 30 exact stent with the use of the                NAV-6 embolic protection device in the right internal carotid                artery. Performing Technologist: Concha Norway RVT  Examination Guidelines: A complete evaluation includes B-mode imaging, spectral Doppler, color Doppler, and power Doppler as needed of all accessible portions of each vessel. Bilateral testing is considered an integral part of a complete examination. Limited examinations for reoccurring indications may be performed as noted.  Right Carotid Findings: +----------+--------+--------+--------+------------------+--------+           PSV cm/sEDV cm/sStenosisPlaque DescriptionComments +----------+--------+--------+--------+------------------+--------+ CCA Prox  81      14                                         +----------+--------+--------+--------+------------------+--------+ CCA Mid   81       14                                         +----------+--------+--------+--------+------------------+--------+ CCA Distal66      17                                stent    +----------+--------+--------+--------+------------------+--------+ ICA Prox  70      17                                stent    +----------+--------+--------+--------+------------------+--------+ ICA Mid   79      14                                         +----------+--------+--------+--------+------------------+--------+ ICA Distal55      16                                         +----------+--------+--------+--------+------------------+--------+ ECA       133     6                                          +----------+--------+--------+--------+------------------+--------+ +----------+--------+-------+----------------+-------------------+  PSV cm/sEDV cmsDescribe        Arm Pressure (mmHG) +----------+--------+-------+----------------+-------------------+ Subclavian150            Multiphasic, WNL                    +----------+--------+-------+----------------+-------------------+ +---------+--------+---+--------+--+---------+ VertebralPSV cm/s100EDV cm/s30Antegrade +---------+--------+---+--------+--+---------+  Left Carotid Findings: +----------+--------+--------+--------+-------------------+--------+           PSV cm/sEDV cm/sStenosisPlaque Description Comments +----------+--------+--------+--------+-------------------+--------+ CCA Prox  89      16                                          +----------+--------+--------+--------+-------------------+--------+ CCA Mid   90      16                                          +----------+--------+--------+--------+-------------------+--------+ CCA Distal88      16                                          +----------+--------+--------+--------+-------------------+--------+ ICA Prox  76      23       1-39%   calcific and smooth         +----------+--------+--------+--------+-------------------+--------+ ICA Mid   63      20                                          +----------+--------+--------+--------+-------------------+--------+ ICA Distal62      20                                          +----------+--------+--------+--------+-------------------+--------+ ECA       187     12      >50%                                +----------+--------+--------+--------+-------------------+--------+ +----------+--------+--------+----------------+-------------------+           PSV cm/sEDV cm/sDescribe        Arm Pressure (mmHG) +----------+--------+--------+----------------+-------------------+ [id number]             Multiphasic, WNL                    +----------+--------+--------+----------------+-------------------+ +---------+--------+--+--------+--+---------+ VertebralPSV cm/s37EDV cm/s10Antegrade +---------+--------+--+--------+--+---------+   Summary: Right Carotid: Velocities in the right ICA are consistent with a 1-39% stenosis.                Non-hemodynamically significant plaque <50% noted in the CCA. The                ECA appears <50% stenosed. Left Carotid: Velocities in the left ICA are consistent with a 1-39% stenosis.               Non-hemodynamically significant plaque <50% noted in the CCA. The               ECA appears >50% stenosed. Vertebrals:  Bilateral vertebral arteries demonstrate antegrade flow.  Subclavians: Normal flow hemodynamics were seen in bilateral subclavian              arteries. *See table(s) above for measurements and observations.  Electronically signed by Hortencia Pilar MD on 12/22/2022 at 8:16:56 AM.    Final    MM 3D SCREEN BREAST UNI RIGHT  Result Date: 11/07/2022 CLINICAL DATA:  Screening. EXAM: DIGITAL SCREENING UNILATERAL RIGHT MAMMOGRAM WITH CAD AND TOMOSYNTHESIS TECHNIQUE: Right screening digital craniocaudal and  mediolateral oblique mammograms were obtained. Right screening digital breast tomosynthesis was performed. The images were evaluated with computer-aided detection. COMPARISON:  Previous exam(s). ACR Breast Density Category c: The breast tissue is heterogeneously dense, which may obscure small masses. FINDINGS: There are no findings suspicious for malignancy. IMPRESSION: No mammographic evidence of malignancy. A result letter of this screening mammogram will be mailed directly to the patient. RECOMMENDATION: Screening mammogram in one year. (Code:SM-B-01Y) BI-RADS CATEGORY  1: Negative. Electronically Signed   By: Marin Olp M.D.   On: 11/07/2022 15:24

## 2023-01-06 NOTE — Assessment & Plan Note (Addendum)
Recommend calcium 1200 mg daily with Vitamin D supplementation Baseline DEXA 2022 - normal- repeat in June 2024  Patient declined  adjuvant bisphosphonate treatment

## 2023-01-14 ENCOUNTER — Ambulatory Visit: Payer: PPO | Admitting: Radiation Oncology

## 2023-02-03 ENCOUNTER — Inpatient Hospital Stay: Payer: PPO | Attending: Radiation Oncology

## 2023-02-03 DIAGNOSIS — C50912 Malignant neoplasm of unspecified site of left female breast: Secondary | ICD-10-CM | POA: Insufficient documentation

## 2023-02-03 DIAGNOSIS — C50919 Malignant neoplasm of unspecified site of unspecified female breast: Secondary | ICD-10-CM

## 2023-02-03 DIAGNOSIS — Z79811 Long term (current) use of aromatase inhibitors: Secondary | ICD-10-CM | POA: Insufficient documentation

## 2023-02-03 DIAGNOSIS — Z170 Estrogen receptor positive status [ER+]: Secondary | ICD-10-CM | POA: Diagnosis not present

## 2023-02-03 DIAGNOSIS — Z95828 Presence of other vascular implants and grafts: Secondary | ICD-10-CM

## 2023-02-03 MED ORDER — SODIUM CHLORIDE 0.9% FLUSH
10.0000 mL | Freq: Once | INTRAVENOUS | Status: AC
  Administered 2023-02-03: 10 mL via INTRAVENOUS
  Filled 2023-02-03: qty 10

## 2023-02-03 MED ORDER — HEPARIN SOD (PORK) LOCK FLUSH 100 UNIT/ML IV SOLN
500.0000 [IU] | Freq: Once | INTRAVENOUS | Status: AC
  Administered 2023-02-03: 500 [IU] via INTRAVENOUS
  Filled 2023-02-03: qty 5

## 2023-02-03 NOTE — Progress Notes (Signed)
Survivorship Care Plan visit completed.  Treatment summary reviewed and given to patient.  ASCO answers booklet reviewed and given to patient.  CARE program and Cancer Transitions discussed with patient along with other resources cancer center offers to patients and caregivers.  Patient verbalized understanding.    

## 2023-02-11 ENCOUNTER — Other Ambulatory Visit (INDEPENDENT_AMBULATORY_CARE_PROVIDER_SITE_OTHER): Payer: PPO

## 2023-02-11 DIAGNOSIS — E559 Vitamin D deficiency, unspecified: Secondary | ICD-10-CM

## 2023-02-11 DIAGNOSIS — E1165 Type 2 diabetes mellitus with hyperglycemia: Secondary | ICD-10-CM

## 2023-02-11 DIAGNOSIS — E785 Hyperlipidemia, unspecified: Secondary | ICD-10-CM | POA: Diagnosis not present

## 2023-02-11 DIAGNOSIS — E1169 Type 2 diabetes mellitus with other specified complication: Secondary | ICD-10-CM | POA: Diagnosis not present

## 2023-02-11 DIAGNOSIS — I152 Hypertension secondary to endocrine disorders: Secondary | ICD-10-CM

## 2023-02-11 DIAGNOSIS — E1159 Type 2 diabetes mellitus with other circulatory complications: Secondary | ICD-10-CM

## 2023-02-11 LAB — COMPREHENSIVE METABOLIC PANEL
ALT: 23 U/L (ref 0–35)
AST: 21 U/L (ref 0–37)
Albumin: 4.4 g/dL (ref 3.5–5.2)
Alkaline Phosphatase: 80 U/L (ref 39–117)
BUN: 16 mg/dL (ref 6–23)
CO2: 26 mEq/L (ref 19–32)
Calcium: 10.4 mg/dL (ref 8.4–10.5)
Chloride: 101 mEq/L (ref 96–112)
Creatinine, Ser: 1.04 mg/dL (ref 0.40–1.20)
GFR: 53.88 mL/min — ABNORMAL LOW (ref >60.00)
Glucose, Bld: 163 mg/dL — ABNORMAL HIGH (ref 70–99)
Potassium: 4.5 mEq/L (ref 3.5–5.1)
Sodium: 138 mEq/L (ref 135–145)
Total Bilirubin: 0.6 mg/dL (ref 0.2–1.2)
Total Protein: 6.9 g/dL (ref 6.0–8.3)

## 2023-02-11 LAB — HEMOGLOBIN A1C: Hgb A1c MFr Bld: 7.5 % — ABNORMAL HIGH (ref 4.6–6.5)

## 2023-02-11 LAB — LIPID PANEL
Cholesterol: 192 mg/dL (ref 0–200)
HDL: 72.3 mg/dL (ref >39.00)
LDL Cholesterol: 95 mg/dL (ref 0–99)
NonHDL: 119.34
Total CHOL/HDL Ratio: 3
Triglycerides: 123 mg/dL (ref 0.0–149.0)
VLDL: 24.6 mg/dL (ref 0.0–40.0)

## 2023-02-11 LAB — MAGNESIUM: Magnesium: 1.9 mg/dL (ref 1.5–2.5)

## 2023-02-11 LAB — VITAMIN D 25 HYDROXY (VIT D DEFICIENCY, FRACTURES): VITD: 69.39 ng/mL (ref 30.00–100.00)

## 2023-02-12 ENCOUNTER — Encounter: Payer: Self-pay | Admitting: Radiation Oncology

## 2023-02-12 ENCOUNTER — Ambulatory Visit
Admission: RE | Admit: 2023-02-12 | Discharge: 2023-02-12 | Disposition: A | Discharge status: Home / Self Care | Payer: PPO | Source: Ambulatory Visit | Attending: Radiation Oncology | Admitting: Radiation Oncology

## 2023-02-12 VITALS — BP 138/73 | HR 73 | Temp 97.5°F | Resp 12

## 2023-02-12 DIAGNOSIS — Z923 Personal history of irradiation: Secondary | ICD-10-CM | POA: Diagnosis not present

## 2023-02-12 DIAGNOSIS — Z79811 Long term (current) use of aromatase inhibitors: Secondary | ICD-10-CM | POA: Diagnosis not present

## 2023-02-12 DIAGNOSIS — C50412 Malignant neoplasm of upper-outer quadrant of left female breast: Secondary | ICD-10-CM | POA: Diagnosis not present

## 2023-02-12 DIAGNOSIS — Z170 Estrogen receptor positive status [ER+]: Secondary | ICD-10-CM | POA: Insufficient documentation

## 2023-02-12 DIAGNOSIS — C50919 Malignant neoplasm of unspecified site of unspecified female breast: Secondary | ICD-10-CM

## 2023-02-12 NOTE — Progress Notes (Signed)
Radiation Oncology Follow up Note  Name: Tammy Nunez   Date:   02/12/2023 MRN:  [id number] DOB: [birth year]    This 72 y.o. female presents to the clinic today for 64-month follow-up status post radiation therapy to left chest wall and peripheral lymphatics for a pathologic T2 N0 grade 3 invasive mammary carcinoma encroaching on the pectoralis muscle.  REFERRING PROVIDER: McLean-Scocuzza, Olivia Mackie *  HPI: Patient is a 72 year old female now out 6 months having completed radiation therapy to her left chest wall and peripheral lymphatics.  She had a pathologic stage T2 lesion with invasion and encroaching the pectoralis muscle.  Seen today in routine follow-up she is doing well specifically denies any chest wall tenderness any new masses or nodularity or any swelling of her left upper extremity..  She had a mammogram of her right breast back in December which was category 1 BI-RADS negative she is currently on Arimidex tolerating it well without side effect  COMPLICATIONS OF TREATMENT: none  FOLLOW UP COMPLIANCE: keeps appointments   PHYSICAL EXAM:  BP 138/73   Pulse 73   Temp (!) 97.5 F (36.4 C) (Tympanic)   Resp 12  Patient status post left modified radical mastectomy incision is well-healed no evidence of chest wall mass or nodularity is noted.  Right breast is free of dominant mass no axillary or supraclavicular adenopathy is appreciated no evidence of lymphedema is noted.  Well-developed well-nourished patient in NAD. HEENT reveals PERLA, EOMI, discs not visualized.  Oral cavity is clear. No oral mucosal lesions are identified. Neck is clear without evidence of cervical or supraclavicular adenopathy. Lungs are clear to A&P. Cardiac examination is essentially unremarkable with regular rate and rhythm without murmur rub or thrill. Abdomen is benign with no organomegaly or masses noted. Motor sensory and DTR levels are equal and symmetric in the upper and lower extremities. Cranial nerves II  through XII are grossly intact. Proprioception is intact. No peripheral adenopathy or edema is identified. No motor or sensory levels are noted. Crude visual fields are within normal range.  RADIOLOGY RESULTS: No current films for review  PLAN: Present time patient is doing well with no evidence of disease now at 6 months from chest wall radiation and pleased with her overall progress.  I have asked to see her back in 1 year for follow-up.  She continues on Arimidex without side effect.  Patient is to call with any concerns.  I would like to take this opportunity to thank you for allowing me to participate in the care of your patient.Noreene Filbert, MD

## 2023-02-16 ENCOUNTER — Ambulatory Visit: Payer: PPO | Admitting: Family Medicine

## 2023-02-23 ENCOUNTER — Telehealth: Payer: Self-pay | Admitting: Family Medicine

## 2023-02-23 DIAGNOSIS — E1165 Type 2 diabetes mellitus with hyperglycemia: Secondary | ICD-10-CM

## 2023-02-23 DIAGNOSIS — E1169 Type 2 diabetes mellitus with other specified complication: Secondary | ICD-10-CM

## 2023-02-23 MED ORDER — EZETIMIBE 10 MG PO TABS: 10.0000 mg | ORAL_TABLET | Freq: Every day | ORAL | 1 refills | Status: DC

## 2023-02-23 MED ORDER — METFORMIN HCL ER 500 MG PO TB24: 500.0000 mg | ORAL_TABLET | Freq: Every day | ORAL | 1 refills | Status: DC

## 2023-02-23 NOTE — Telephone Encounter (Signed)
Pt called requesting results for her labs

## 2023-02-23 NOTE — Telephone Encounter (Signed)
Spoke with the patient and informed her of her lab results and she understood.  Patient was okay with starting the zetia and starting the metformin back and I sent those medications to her pharmacy. Joclyn Alsobrook,cma

## 2023-02-24 ENCOUNTER — Ambulatory Visit (INDEPENDENT_AMBULATORY_CARE_PROVIDER_SITE_OTHER): Payer: PPO | Admitting: Family Medicine

## 2023-02-24 ENCOUNTER — Encounter: Payer: Self-pay | Admitting: Family Medicine

## 2023-02-24 VITALS — BP 120/78 | HR 78 | Temp 98.4°F | Ht 61.0 in | Wt 159.6 lb

## 2023-02-24 DIAGNOSIS — E782 Mixed hyperlipidemia: Secondary | ICD-10-CM

## 2023-02-24 DIAGNOSIS — Z1231 Encounter for screening mammogram for malignant neoplasm of breast: Secondary | ICD-10-CM

## 2023-02-24 DIAGNOSIS — I6529 Occlusion and stenosis of unspecified carotid artery: Secondary | ICD-10-CM | POA: Diagnosis not present

## 2023-02-24 DIAGNOSIS — I152 Hypertension secondary to endocrine disorders: Secondary | ICD-10-CM

## 2023-02-24 DIAGNOSIS — E1159 Type 2 diabetes mellitus with other circulatory complications: Secondary | ICD-10-CM

## 2023-02-24 DIAGNOSIS — E1165 Type 2 diabetes mellitus with hyperglycemia: Secondary | ICD-10-CM | POA: Diagnosis not present

## 2023-02-24 NOTE — Progress Notes (Signed)
SUBJECTIVE:   Chief Complaint  Patient presents with   Medical Management of Chronic Issues    2 month follow up   HPI Patient presents to clinic today for follow-up chronic disease management.  No acute concerns today.  DM type II Asymptomatic.  Currently on Jardiance 25 mg daily.  Per chart review metformin was discontinued in January 2023 secondary to chemotherapy induced nausea.  However patient reports she had been receiving refills for metformin and had continued taking medication.  Has not had metformin since run out in December 2023.  Agreeable to restart metformin given no longer requiring chemotherapy.  Hypertension Asymptomatic.  Well-controlled on current medications.  Takes Coreg 25 mg twice daily, lisinopril 40 mg daily.  Tolerating medication well.  No episodes of dizziness, weakness, headaches or visual changes.  Denies any chest pain, shortness of breath or lower extremity edema.  History of CVA On statin, DAPT with Plavix 75 mg, ASA 81 mg daily.  Blood pressure well-controlled and less than 130/80.  Tolerating medication well.  Denies any signs of bleeding.   PERTINENT PMH / PSH: Hypertension DM type II CAD CVA Left carotid stenosis status post stent Hyperlipidemia   OBJECTIVE:  BP 120/78   Pulse 78   Temp 98.4 F (36.9 C) (Oral)   Ht 5\' 1"  (1.549 m)   Wt 159 lb 9.6 oz (72.4 kg)   SpO2 97%   BMI 30.16 kg/m    Physical Exam Vitals reviewed.  Constitutional:      General: She is not in acute distress.    Appearance: Normal appearance. She is normal weight. She is not ill-appearing, toxic-appearing or diaphoretic.  Eyes:     General:        Right eye: No discharge.        Left eye: No discharge.     Conjunctiva/sclera: Conjunctivae normal.  Cardiovascular:     Rate and Rhythm: Normal rate and regular rhythm.     Heart sounds: Normal heart sounds.  Pulmonary:     Effort: Pulmonary effort is normal.     Breath sounds: Normal breath sounds.   Abdominal:     General: Bowel sounds are normal.  Musculoskeletal:        General: Normal range of motion.  Skin:    General: Skin is warm and dry.  Neurological:     General: No focal deficit present.     Mental Status: She is alert and oriented to person, place, and time. Mental status is at baseline.  Psychiatric:        Mood and Affect: Mood normal.        Behavior: Behavior normal.        Thought Content: Thought content normal.        Judgment: Judgment normal.     ASSESSMENT/PLAN:  Type 2 diabetes mellitus with hyperglycemia, without long-term current use of insulin Assessment & Plan: Chronic.  Stable.  Asymptomatic. Repeat A1c increased from 6.2 to 7.5. Continue Jardiance 25 mg daily Restart metformin XR 500 mg daily Check vitamin B12 at next visit. On statin and ACE inhibitor Foot exam completed today Encouraged yearly eye exam Follow-up in 3 months  Orders: -     Microalbumin / creatinine urine ratio; Future  Hypertension associated with diabetes Assessment & Plan: Chronic.  Stable.  Well-controlled per JNC 8 guidelines less than 140/90 for age Continue amlodipine 10 mg daily Continue lisinopril 40 mg daily Continue carvedilol 25 mg twice daily Urine ACR today  Asymptomatic carotid artery stenosis, unspecified laterality Assessment & Plan: Chronic.  Stable.  Status carotid post stent 2020 Continue DAPT and statin therapy Follows with vascular surgery. Have requested clarification on length DAPT therapy     Mixed hyperlipidemia Assessment & Plan: Chronic.  LDL less than 100.  Goal less than 70. Currently on atorvastatin 80 mg daily Start Zetia 10 mg daily Repeat lipids in 3 months, if remains elevated can consider PCSK 9i    Breast cancer screening by mammogram -     3D Screening Mammogram, Left and Right; Future  HCM Recommend tetanus booster Declined colonoscopy and Cologuard Recommend diabetic eye exam Mammogram ordered today  PDMP  reviewed  Return in about 3 months (around 05/26/2023) for PCP.  Dana Allan, MD

## 2023-02-24 NOTE — Patient Instructions (Addendum)
It was a pleasure meeting you today. Thank you for allowing me to take part in your health care.  Our goals for today as we discussed include:  A1c has increased to 7.5  Start Metformin XR 500 mg daily  Bad cholesterol goal less than 70.  Yours is 97 Continue atorvastatin 80 mg daily Start Zetia 10 mg daily  Follow up in 3 months  Diabetic Foot Exam completed.  Due 02/2024  Please have eye doctor forward results of exam to clinic  Recommend Tetanus Vaccination.  This is given every 10 years.   Urine today to check for increased protein  Please arrive 15 minutes prior to your appointment.  Arrivals 5 minutes past your appointment time will need to be rescheduled.  This is to ensure that all patients are seen in a timely manner.  Thank you for your understanding and cooperation.    If you have any questions or concerns, please do not hesitate to call the office at [phone].  I look forward to our next visit and until then take care and stay safe.  Regards,   Carollee Leitz, MD   [facility]

## 2023-03-01 ENCOUNTER — Encounter: Payer: Self-pay | Admitting: Family Medicine

## 2023-03-01 DIAGNOSIS — Z1231 Encounter for screening mammogram for malignant neoplasm of breast: Secondary | ICD-10-CM | POA: Insufficient documentation

## 2023-03-01 NOTE — Assessment & Plan Note (Signed)
Chronic.  LDL less than 100.  Goal less than 70. Currently on atorvastatin 80 mg daily Start Zetia 10 mg daily Repeat lipids in 3 months, if remains elevated can consider PCSK 9i

## 2023-03-01 NOTE — Assessment & Plan Note (Signed)
Chronic.  Stable.  Status carotid post stent 2020 Continue DAPT and statin therapy Follows with vascular surgery. Have requested clarification on length DAPT therapy

## 2023-03-01 NOTE — Assessment & Plan Note (Signed)
Chronic.  Stable.  Well-controlled per JNC 8 guidelines less than 140/90 for age Continue amlodipine 10 mg daily Continue lisinopril 40 mg daily Continue carvedilol 25 mg twice daily Urine ACR today

## 2023-03-01 NOTE — Assessment & Plan Note (Signed)
Chronic.  Stable.  Mild left-sided weakness status post CVA 2020.  Previously seen by Dr. Malvin Johns at [facility]. Maintain blood pressure less than 140/90 per JNC 8 guidelines for age Continue amlodipine 10 mg daily Continue lisinopril 40 mg daily Continue carvedilol 25 mg twice daily Continue Plavix 75 mg daily and ASA 81 mg daily.  Have requested clarification on DAPT therapy for secondary prevention.

## 2023-03-01 NOTE — Assessment & Plan Note (Addendum)
Chronic.  Stable.  Asymptomatic. Repeat A1c increased from 6.2 to 7.5. Continue Jardiance 25 mg daily Restart metformin XR 500 mg daily Check vitamin B12 at next visit. On statin and ACE inhibitor Foot exam completed today Encouraged yearly eye exam Follow-up in 3 months

## 2023-03-01 NOTE — Assessment & Plan Note (Signed)
>>  ASSESSMENT AND PLAN FOR CEREBROVASCULAR ACCIDENT (CVA) (HCC) WRITTEN ON 03/01/2023 11:32 AM BY WALSH, TANYA, MD  Chronic.  Stable.  Mild left-sided weakness status post CVA 2020.  Previously seen by Dr. Lane at [facility]. Maintain blood pressure less than 140/90 per JNC 8 guidelines for age Continue amlodipine  10 mg daily Continue lisinopril  40 mg daily Continue carvedilol  25 mg twice daily Continue Plavix  75 mg daily and ASA 81 mg daily.  Have requested clarification on DAPT therapy for secondary prevention.

## 2023-03-14 IMAGING — MG MM DIGITAL DIAGNOSTIC UNILAT*L* W/ TOMO W/ CAD
5 of 8 series · 5 of 20 positions shown · non-contrast
Comparison: Previous baseline exam.

CLINICAL DATA: Callback for LEFT breast masses with calcifications
and distortion

EXAM:
DIGITAL DIAGNOSTIC UNILATERAL LEFT MAMMOGRAM WITH TOMOSYNTHESIS AND
CAD; ULTRASOUND LEFT BREAST LIMITED
TECHNIQUE: Left digital diagnostic mammography and breast tomosynthesis was
performed. The images were evaluated with computer-aided detection.;
Targeted ultrasound examination of the left breast was performed

[L LM]
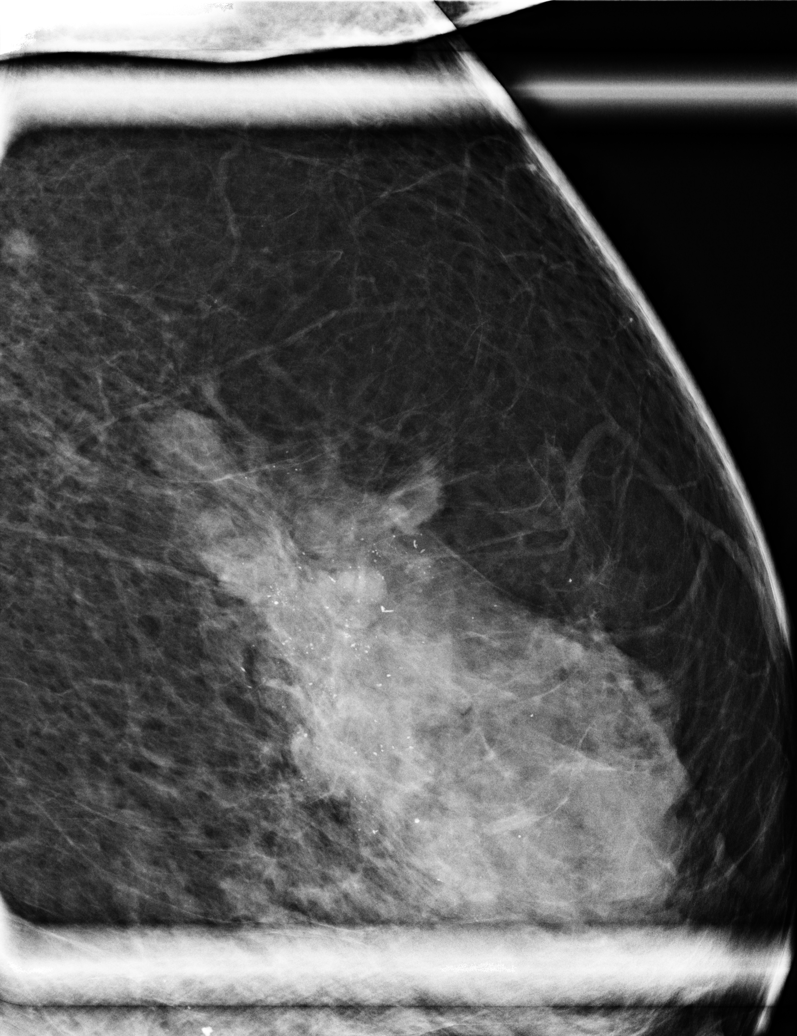

[L CC]
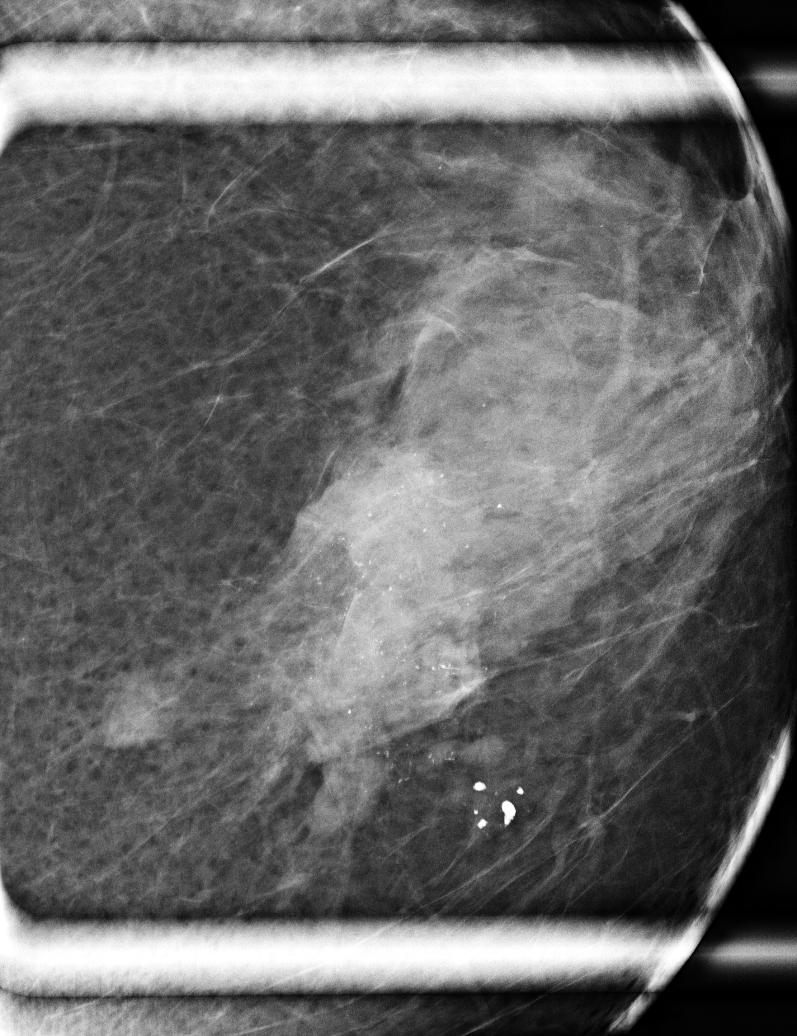

[L MLO synth-2D]
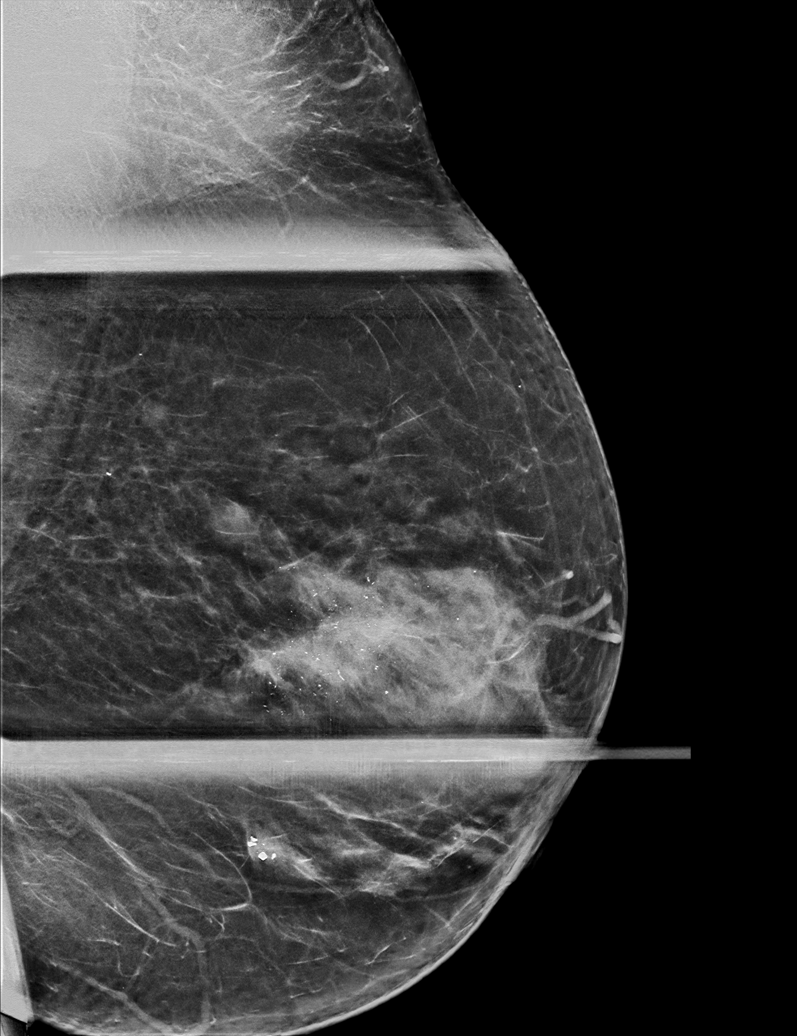

[L ML synth-2D]
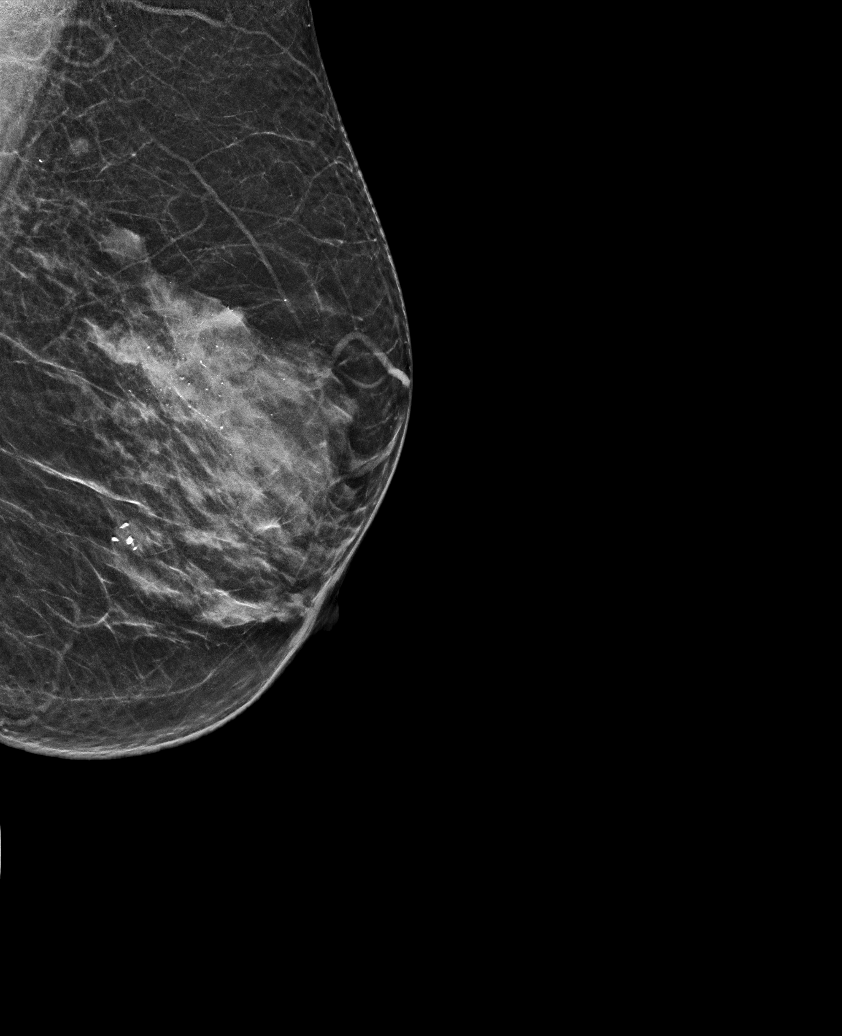

[L CC synth-2D]
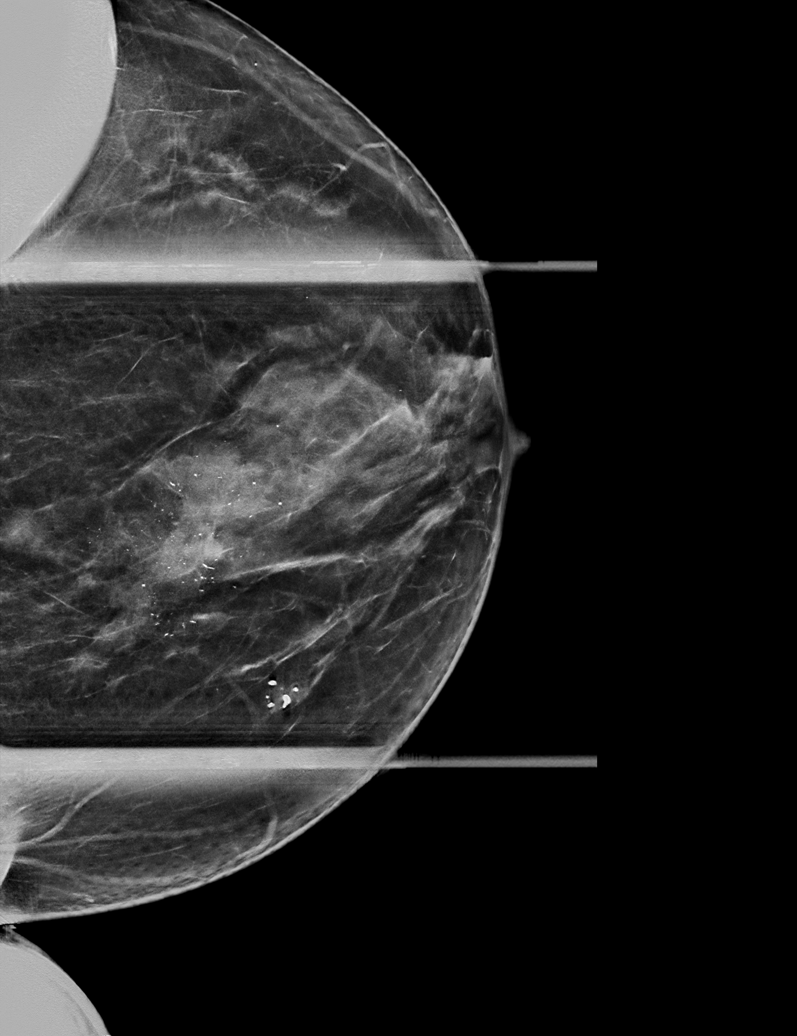

[5 of 20 positions shown; findings below may reference images not displayed]

ACR Breast Density Category c: The breast tissue is heterogeneously
dense, which may obscure small masses.
FINDINGS: Spot compression tomosynthesis views confirm persistence of an
irregular mass with associated architectural distortion in the upper
breast at middle depth (mass 1). There are multiple adjacent and
possibly contiguous irregular masses. This conglomeration of masses
measures at least 5.6 cm. Spot magnification views demonstrated
associated segmental pleomorphic calcifications. These extend at
least 5.0 by 3.9 by 3.7 cm.

There are several adjacent separate masses extending posteriorly
from the dominant conglomeration of masses.

Mass 2:

There is a 1 cm mass in the upper breast at middle to posterior
depth (ML slice 23).

Mass 3:

There is a 5 mm irregular mass in the upper breast at posterior
depth (ML slice 23).

In total, mass 1 through mass 3 span at least 8.2 cm in
anterior-posterior extent.

On physical exam, there is a hard mass in the upper breast.

Targeted ultrasound was performed of the upper breast. There are
multiple masses as follows.

Mass 1:

There is a conglomeration of masses at 12 o'clock 5 cm from the
nipple. It is irregular with heterogeneous internal echotexture.
Exact measurements are difficult to ascertain due to heterogeneous
interconnected appearance of multiple irregular masses but is
estimated to span at least 2.7 x 1.5 by 4.3 cm.

Mass 2:

At 12 o'clock 12 cm from the nipple, there is an irregular mass with
irregular margins. It measures 9 by 9 x 5 mm and is distinct from
the conglomeration of masses. This corresponds to mass 2 described
on mammogram.

Mass 3:

At 12 o'clock 14 cm from the nipple, there is an irregular
hypoechoic mass. It measures 4 x 4 by 4 mm. It is a distinct mass
that is approximately 2.4 cm superior from mass 2. This corresponds
to mass 3 described on mammogram.

Mass 4:

Adjacent to the interconnected conglomeration of masses is an
additional additional mass at 12 o'clock 10 cm from the nipple which
is irregular and hypoechoic in appearance. It measures 14 x 7 x 11
mm. It is unclear whether directly connected to the dominant mass.

Targeted ultrasound was performed of the LEFT axilla. No suspicious
axillary lymph nodes are seen.
IMPRESSION: 1. There are multiple irregular masses in the LEFT upper breast
which span approximately 8.2 cm in total which are highly concerning
for malignancy. Dominant conglomeration of masses measures at least
5.6 cm mammographically and demonstrates associated pleomorphic
calcifications. Recommend ultrasound-guided biopsy of this dominant
conglomeration of masses (12 o'clock 5 cm from the nipple; mass 1).
2. There are at least 2 adjacent distinctly separate masses which
likely reflect satellite lesions. Recommend ultrasound-guided biopsy
of 1 of these masses to demonstrate extent of disease. Mass 2 (12
o'clock 12 cm from the nipple) is likely the most amenable for
ultrasound-guided biopsy.
3. No suspicious LEFT axillary adenopathy.

RECOMMENDATION:
LEFT breast ultrasound-guided biopsy x2

I have discussed the findings and recommendations with the patient.
If applicable, a reminder letter will be sent to the patient
regarding the next appointment. Patient will be scheduled for biopsy
at her earliest convenience by the schedulers and ordering provider
will be notified.

BI-RADS CATEGORY  5: Highly suggestive of malignancy.

## 2023-03-31 ENCOUNTER — Inpatient Hospital Stay: Payer: PPO | Attending: Radiation Oncology

## 2023-03-31 DIAGNOSIS — C50912 Malignant neoplasm of unspecified site of left female breast: Secondary | ICD-10-CM | POA: Insufficient documentation

## 2023-03-31 DIAGNOSIS — Z452 Encounter for adjustment and management of vascular access device: Secondary | ICD-10-CM | POA: Diagnosis not present

## 2023-03-31 DIAGNOSIS — Z95828 Presence of other vascular implants and grafts: Secondary | ICD-10-CM

## 2023-03-31 MED ORDER — HEPARIN SOD (PORK) LOCK FLUSH 100 UNIT/ML IV SOLN
500.0000 [IU] | Freq: Once | INTRAVENOUS | Status: AC
  Filled 2023-03-31: qty 5

## 2023-03-31 MED ORDER — SODIUM CHLORIDE 0.9% FLUSH
10.0000 mL | INTRAVENOUS | Status: DC | PRN
  Administered 2023-03-31: 10 mL via INTRAVENOUS
  Filled 2023-03-31: qty 10

## 2023-03-31 MED ORDER — HEPARIN SOD (PORK) LOCK FLUSH 100 UNIT/ML IV SOLN
INTRAVENOUS | Status: AC
  Administered 2023-03-31: 500 [IU] via INTRAVENOUS
  Filled 2023-03-31: qty 5

## 2023-04-07 ENCOUNTER — Telehealth: Payer: Self-pay | Admitting: Family Medicine

## 2023-04-07 DIAGNOSIS — E1165 Type 2 diabetes mellitus with hyperglycemia: Secondary | ICD-10-CM

## 2023-04-07 MED ORDER — FREESTYLE LIBRE 2 SENSOR MISC: 5 refills | Status: DC

## 2023-04-07 NOTE — Telephone Encounter (Signed)
Rx sent to pharmacy   

## 2023-04-07 NOTE — Telephone Encounter (Signed)
Pt called stating she need a sensor refill to her libre sent to [facility]

## 2023-04-09 NOTE — Telephone Encounter (Signed)
Patient's husband went to [facility] today. Pharmacy said they did not have the order.

## 2023-04-09 NOTE — Telephone Encounter (Signed)
Continuous Glucose Sensor (FREESTYLE LIBRE 2 SENSOR)  E-Prescribing Status: Receipt confirmed by pharmacy (04/07/2023  1:15 PM EDT)   Per [facility] the rx had been placed on hold for some reason. They will get it ready for pick up and notify patient. Nothing further needed at this time.

## 2023-04-23 LAB — HM DIABETES EYE EXAM

## 2023-04-30 ENCOUNTER — Encounter: Payer: Self-pay | Admitting: Oncology

## 2023-05-19 ENCOUNTER — Ambulatory Visit
Admission: RE | Admit: 2023-05-19 | Discharge: 2023-05-19 | Disposition: A | Discharge status: Home / Self Care | Payer: PPO | Source: Ambulatory Visit | Attending: Oncology | Admitting: Oncology

## 2023-05-19 DIAGNOSIS — Z853 Personal history of malignant neoplasm of breast: Secondary | ICD-10-CM

## 2023-05-19 DIAGNOSIS — Z8673 Personal history of transient ischemic attack (TIA), and cerebral infarction without residual deficits: Secondary | ICD-10-CM | POA: Insufficient documentation

## 2023-05-19 DIAGNOSIS — Z13820 Encounter for screening for osteoporosis: Secondary | ICD-10-CM | POA: Diagnosis not present

## 2023-05-19 DIAGNOSIS — Z79811 Long term (current) use of aromatase inhibitors: Secondary | ICD-10-CM | POA: Diagnosis not present

## 2023-05-19 DIAGNOSIS — E119 Type 2 diabetes mellitus without complications: Secondary | ICD-10-CM | POA: Insufficient documentation

## 2023-05-19 DIAGNOSIS — Z780 Asymptomatic menopausal state: Secondary | ICD-10-CM | POA: Diagnosis not present

## 2023-05-25 ENCOUNTER — Other Ambulatory Visit: Payer: Self-pay

## 2023-05-25 DIAGNOSIS — I10 Essential (primary) hypertension: Secondary | ICD-10-CM

## 2023-05-25 DIAGNOSIS — I639 Cerebral infarction, unspecified: Secondary | ICD-10-CM

## 2023-05-25 MED ORDER — CARVEDILOL 25 MG PO TABS
25.0000 mg | ORAL_TABLET | Freq: Two times a day (BID) | ORAL | 3 refills | Status: DC
Start: 2023-05-25 — End: 2024-07-13

## 2023-05-25 NOTE — Telephone Encounter (Signed)
Last filled by [facility] 

## 2023-05-26 ENCOUNTER — Inpatient Hospital Stay: Payer: PPO

## 2023-05-26 ENCOUNTER — Encounter: Payer: Self-pay | Admitting: Family Medicine

## 2023-05-26 ENCOUNTER — Ambulatory Visit (INDEPENDENT_AMBULATORY_CARE_PROVIDER_SITE_OTHER): Payer: PPO | Admitting: Family Medicine

## 2023-05-26 VITALS — BP 130/70 | HR 77 | Temp 97.6°F | Resp 16 | Ht 61.0 in | Wt 166.0 lb

## 2023-05-26 DIAGNOSIS — E1159 Type 2 diabetes mellitus with other circulatory complications: Secondary | ICD-10-CM

## 2023-05-26 DIAGNOSIS — E785 Hyperlipidemia, unspecified: Secondary | ICD-10-CM | POA: Diagnosis not present

## 2023-05-26 DIAGNOSIS — I152 Hypertension secondary to endocrine disorders: Secondary | ICD-10-CM

## 2023-05-26 DIAGNOSIS — Z7984 Long term (current) use of oral hypoglycemic drugs: Secondary | ICD-10-CM | POA: Diagnosis not present

## 2023-05-26 DIAGNOSIS — E1169 Type 2 diabetes mellitus with other specified complication: Secondary | ICD-10-CM

## 2023-05-26 DIAGNOSIS — I10 Essential (primary) hypertension: Secondary | ICD-10-CM | POA: Diagnosis not present

## 2023-05-26 DIAGNOSIS — I639 Cerebral infarction, unspecified: Secondary | ICD-10-CM

## 2023-05-26 DIAGNOSIS — I69354 Hemiplegia and hemiparesis following cerebral infarction affecting left non-dominant side: Secondary | ICD-10-CM | POA: Diagnosis not present

## 2023-05-26 DIAGNOSIS — E1165 Type 2 diabetes mellitus with hyperglycemia: Secondary | ICD-10-CM | POA: Diagnosis not present

## 2023-05-26 LAB — MICROALBUMIN / CREATININE URINE RATIO
Creatinine,U: 73.9 mg/dL
Microalb Creat Ratio: 0.9 mg/g (ref 0.0–30.0)
Microalb, Ur: <0.7 mg/dL (ref 0.0–1.9)

## 2023-05-26 LAB — POCT GLYCOSYLATED HEMOGLOBIN (HGB A1C): Hemoglobin A1C: 7.2 % — AB (ref 4.0–5.6)

## 2023-05-26 MED ORDER — LISINOPRIL 40 MG PO TABS: 40.0000 mg | ORAL_TABLET | Freq: Every day | ORAL | 3 refills | Status: DC

## 2023-05-26 MED ORDER — METFORMIN HCL ER 500 MG PO TB24: 500.0000 mg | ORAL_TABLET | Freq: Every day | ORAL | 3 refills | Status: DC

## 2023-05-26 MED ORDER — EMPAGLIFLOZIN 25 MG PO TABS: 25.0000 mg | ORAL_TABLET | Freq: Every day | ORAL | 3 refills | Status: DC

## 2023-05-26 MED ORDER — ATORVASTATIN CALCIUM 80 MG PO TABS: 80.0000 mg | ORAL_TABLET | Freq: Every day | ORAL | 3 refills | Status: DC

## 2023-05-26 MED ORDER — AMLODIPINE BESYLATE 5 MG PO TABS: 5.0000 mg | ORAL_TABLET | Freq: Every day | ORAL | 3 refills | Status: DC

## 2023-05-26 MED ORDER — ASPIRIN 81 MG PO TBEC: 81.0000 mg | DELAYED_RELEASE_TABLET | Freq: Every day | ORAL | 3 refills | Status: DC

## 2023-05-26 MED ORDER — EZETIMIBE 10 MG PO TABS: 10.0000 mg | ORAL_TABLET | Freq: Every day | ORAL | 3 refills | Status: DC

## 2023-05-26 NOTE — Progress Notes (Signed)
SUBJECTIVE:   Chief Complaint  Patient presents with   Diabetes   HPI Patient presents to clinic today for follow-up chronic disease management.  No acute concerns today.  DM type II Asymptomatic.  Currently on Jardiance 25 mg daily and Metformin XR 500 mg daily.  Tolerating medication well.  Endorses eating increased sweets.  Does not check blood glucose at home.     Hypertension Asymptomatic.  Well-controlled on current medications, Coreg 25 mg twice daily, lisinopril 40 mg daily. Does not check BP at home.     History of CVA with right side deficit On statin, DAPT with Plavix 75 mg, ASA 81 mg daily.  Blood pressure well-controlled and less than 130/80.  Tolerating medication well.  Denies any signs of bleeding.   PERTINENT PMH / PSH: Hypertension DM type II CAD CVA Left carotid stenosis status post stent Hyperlipidemia   OBJECTIVE:  BP 130/70   Pulse 77   Temp 97.6 F (36.4 C)   Resp 16   Ht 5\' 1"  (1.549 m)   Wt 166 lb (75.3 kg)   SpO2 98%   BMI 31.37 kg/m    Physical Exam Vitals reviewed.  Constitutional:      General: She is not in acute distress.    Appearance: Normal appearance. She is obese. She is not ill-appearing, toxic-appearing or diaphoretic.  Eyes:     General:        Right eye: No discharge.        Left eye: No discharge.     Conjunctiva/sclera: Conjunctivae normal.  Cardiovascular:     Rate and Rhythm: Normal rate and regular rhythm.     Heart sounds: Normal heart sounds.  Pulmonary:     Effort: Pulmonary effort is normal.     Breath sounds: Normal breath sounds.  Abdominal:     General: Bowel sounds are normal.  Musculoskeletal:     Right lower leg: No edema.     Left lower leg: No edema.  Skin:    General: Skin is warm and dry.  Neurological:     General: No focal deficit present.     Mental Status: She is alert and oriented to person, place, and time. Mental status is at baseline.     Comments: Left hemipare  Psychiatric:         Mood and Affect: Mood normal.        Behavior: Behavior normal.        Thought Content: Thought content normal.        Judgment: Judgment normal.     ASSESSMENT/PLAN:  Type 2 diabetes mellitus with hyperglycemia, without long-term current use of insulin (HCC) Assessment & Plan: Chronic.  Stable.  Asymptomatic.  Repeat A1c 7.2 today, no significant decrease Continue Jardiance 25 mg daily Increase metformin XR 500 mg BID Check vitamin B12 at next visit. On statin and ACE inhibitor Foot exam completed  Eye exam May 30th. Patient to have results sent to clinic   Orders: -     POCT glycosylated hemoglobin (Hb A1C) -     Empagliflozin; Take 1 tablet (25 mg total) by mouth daily.  Dispense: 90 tablet; Refill: 3 -     Microalbumin / creatinine urine ratio  Essential hypertension -     amLODIPine Besylate; Take 1 tablet (5 mg total) by mouth daily.  Dispense: 90 tablet; Refill: 3 -     Lisinopril; Take 1 tablet (40 mg total) by mouth daily.  Dispense: 90 tablet; Refill:  3  Cerebrovascular accident (CVA), unspecified mechanism (HCC) -     Atorvastatin Calcium; Take 1 tablet (80 mg total) by mouth daily.  Dispense: 90 tablet; Refill: 3  Hyperlipidemia, unspecified hyperlipidemia type -     Atorvastatin Calcium; Take 1 tablet (80 mg total) by mouth daily.  Dispense: 90 tablet; Refill: 3  Hyperlipidemia associated with type 2 diabetes mellitus (HCC) Assessment & Plan: Chronic.  Stable.  On statin therapy.  No myalgias Continue atorvastatin 80 mg daily Continue Zetia 10 mg daily  Orders: -     Ezetimibe; Take 1 tablet (10 mg total) by mouth daily.  Dispense: 90 tablet; Refill: 3  Hypertension associated with diabetes (HCC) Assessment & Plan: Chronic.  Stable.  Well-controlled per JNC 8 guidelines less than 140/90 for age Refill amlodipine 10 mg daily Refill lisinopril 40 mg daily Continue carvedilol 25 mg twice daily    Other orders -     Aspirin; Take 1 tablet (81 mg  total) by mouth daily.  Dispense: 90 tablet; Refill: 3  HCM Recommend tetanus booster Declined colonoscopy and Cologuard Mammogram ordered.  Patient to schedule appointment  PDMP reviewed  Return in about 6 months (around 11/26/2023) for PCP.  Dana Allan, MD

## 2023-05-26 NOTE — Patient Instructions (Addendum)
It was a pleasure meeting you today. Thank you for allowing me to take part in your health care.  Our goals for today as we discussed include:  Refills sent for medications  Stop potassium supplements  Recommend Tetanus Vaccination.  This is given every 10 years.   You should pay attention to your hemoglobin A1C.  It is a three month test about your average blood sugar. If the A1C is - <7.0 is great.  That is our goal for treating you. - Between 7.0 and 9.0 is not so good.  We would need to work to do better. - Above 9.0 is terrible.  You would really need to work with [place] to get it under control.    Today's A1C = 7.2   If you have any questions or concerns, please do not hesitate to call the office at [phone].  I look forward to our next visit and until then take care and stay safe.  Regards,   Dana Allan, MD   [facility]

## 2023-06-03 ENCOUNTER — Inpatient Hospital Stay: Payer: PPO | Attending: Radiation Oncology

## 2023-06-03 DIAGNOSIS — C50812 Malignant neoplasm of overlapping sites of left female breast: Secondary | ICD-10-CM | POA: Diagnosis not present

## 2023-06-03 DIAGNOSIS — Z95828 Presence of other vascular implants and grafts: Secondary | ICD-10-CM

## 2023-06-03 DIAGNOSIS — Z170 Estrogen receptor positive status [ER+]: Secondary | ICD-10-CM | POA: Insufficient documentation

## 2023-06-03 MED ORDER — SODIUM CHLORIDE 0.9% FLUSH
10.0000 mL | Freq: Once | INTRAVENOUS | Status: AC
  Administered 2023-06-03: 10 mL via INTRAVENOUS
  Filled 2023-06-03: qty 10

## 2023-06-03 MED ORDER — HEPARIN SOD (PORK) LOCK FLUSH 100 UNIT/ML IV SOLN
500.0000 [IU] | Freq: Once | INTRAVENOUS | Status: AC
  Administered 2023-06-03: 500 [IU] via INTRAVENOUS
  Filled 2023-06-03: qty 5

## 2023-06-08 ENCOUNTER — Telehealth: Payer: Self-pay

## 2023-06-08 NOTE — Telephone Encounter (Signed)
-----   Message from Dana Allan sent at 06/05/2023 11:28 PM EDT ----- A1c has improving.  Goal 7.0.  Would recommend increasing Metformin XR to 500 mg two tablets with breakfast.  If agreeable will send in refill. Also recommend limiting sweets.

## 2023-06-16 NOTE — Progress Notes (Signed)
LVM for pt to call back for lab results

## 2023-06-16 NOTE — Telephone Encounter (Signed)
Pt returned [place] LPN call. Note below was read to pt. Pt its confused because she stated she takes 2 tablets a day of Metformin. One in the morning and one in the evening.

## 2023-06-17 ENCOUNTER — Other Ambulatory Visit: Payer: Self-pay | Admitting: Family Medicine

## 2023-06-17 DIAGNOSIS — E1165 Type 2 diabetes mellitus with hyperglycemia: Secondary | ICD-10-CM

## 2023-06-18 ENCOUNTER — Other Ambulatory Visit: Payer: Self-pay | Admitting: Family Medicine

## 2023-06-18 ENCOUNTER — Encounter: Payer: Self-pay | Admitting: Family Medicine

## 2023-06-18 DIAGNOSIS — E1165 Type 2 diabetes mellitus with hyperglycemia: Secondary | ICD-10-CM

## 2023-06-18 MED ORDER — METFORMIN HCL ER 500 MG PO TB24
500.0000 mg | ORAL_TABLET | Freq: Two times a day (BID) | ORAL | 3 refills | Status: DC
Start: 2023-06-18 — End: 2023-06-19

## 2023-06-19 ENCOUNTER — Other Ambulatory Visit: Payer: Self-pay

## 2023-06-19 DIAGNOSIS — E1165 Type 2 diabetes mellitus with hyperglycemia: Secondary | ICD-10-CM

## 2023-06-19 MED ORDER — METFORMIN HCL ER 500 MG PO TB24
500.0000 mg | ORAL_TABLET | Freq: Two times a day (BID) | ORAL | 3 refills | Status: DC
Start: 2023-06-19 — End: 2023-12-15

## 2023-06-21 ENCOUNTER — Encounter: Payer: Self-pay | Admitting: Family Medicine

## 2023-06-21 NOTE — Assessment & Plan Note (Signed)
Chronic.  Stable.  On statin therapy.  No myalgias Continue atorvastatin 80 mg daily Continue Zetia 10 mg daily

## 2023-06-21 NOTE — Assessment & Plan Note (Signed)
Chronic.  Stable.  Asymptomatic.  Repeat A1c 7.2 today, no significant decrease Continue Jardiance 25 mg daily Increase metformin XR 500 mg BID Check vitamin B12 at next visit. On statin and ACE inhibitor Foot exam completed  Eye exam May 30th. Patient to have results sent to clinic

## 2023-06-21 NOTE — Assessment & Plan Note (Signed)
Chronic.  Stable.  Well-controlled per JNC 8 guidelines less than 140/90 for age Refill amlodipine 10 mg daily Refill lisinopril 40 mg daily Continue carvedilol 25 mg twice daily

## 2023-07-07 ENCOUNTER — Encounter: Payer: Self-pay | Admitting: Oncology

## 2023-07-07 ENCOUNTER — Inpatient Hospital Stay: Payer: PPO | Attending: Radiation Oncology

## 2023-07-07 ENCOUNTER — Inpatient Hospital Stay (HOSPITAL_BASED_OUTPATIENT_CLINIC_OR_DEPARTMENT_OTHER): Payer: PPO | Admitting: Oncology

## 2023-07-07 VITALS — BP 175/58 | HR 79 | Temp 97.3°F | Resp 18 | Wt 164.0 lb

## 2023-07-07 DIAGNOSIS — Z170 Estrogen receptor positive status [ER+]: Secondary | ICD-10-CM | POA: Diagnosis not present

## 2023-07-07 DIAGNOSIS — Z923 Personal history of irradiation: Secondary | ICD-10-CM | POA: Diagnosis not present

## 2023-07-07 DIAGNOSIS — Z79811 Long term (current) use of aromatase inhibitors: Secondary | ICD-10-CM

## 2023-07-07 DIAGNOSIS — C50812 Malignant neoplasm of overlapping sites of left female breast: Secondary | ICD-10-CM | POA: Diagnosis present

## 2023-07-07 DIAGNOSIS — Z87891 Personal history of nicotine dependence: Secondary | ICD-10-CM | POA: Diagnosis not present

## 2023-07-07 DIAGNOSIS — Z9012 Acquired absence of left breast and nipple: Secondary | ICD-10-CM | POA: Diagnosis not present

## 2023-07-07 DIAGNOSIS — C50919 Malignant neoplasm of unspecified site of unspecified female breast: Secondary | ICD-10-CM | POA: Diagnosis not present

## 2023-07-07 DIAGNOSIS — Z95828 Presence of other vascular implants and grafts: Secondary | ICD-10-CM | POA: Diagnosis not present

## 2023-07-07 DIAGNOSIS — Z9221 Personal history of antineoplastic chemotherapy: Secondary | ICD-10-CM | POA: Insufficient documentation

## 2023-07-07 LAB — CBC WITH DIFFERENTIAL/PLATELET
Abs Immature Granulocytes: 0.03 10*3/uL (ref 0.00–0.07)
Basophils Absolute: 0 10*3/uL (ref 0.0–0.1)
Basophils Relative: 1 %
Eosinophils Absolute: 0.2 10*3/uL (ref 0.0–0.5)
Eosinophils Relative: 3 %
HCT: 42.1 % (ref 36.0–46.0)
Hemoglobin: 13.7 g/dL (ref 12.0–15.0)
Immature Granulocytes: 1 %
Lymphocytes Relative: 26 %
Lymphs Abs: 1.4 10*3/uL (ref 0.7–4.0)
MCH: 31.6 pg (ref 26.0–34.0)
MCHC: 32.5 g/dL (ref 30.0–36.0)
MCV: 97.2 fL (ref 80.0–100.0)
Monocytes Absolute: 0.5 10*3/uL (ref 0.1–1.0)
Monocytes Relative: 9 %
Neutro Abs: 3.4 10*3/uL (ref 1.7–7.7)
Neutrophils Relative %: 60 %
Platelets: 158 10*3/uL (ref 150–400)
RBC: 4.33 MIL/uL (ref 3.87–5.11)
RDW: 13.6 % (ref 11.5–15.5)
WBC: 5.5 10*3/uL (ref 4.0–10.5)
nRBC: 0 % (ref 0.0–0.2)

## 2023-07-07 LAB — COMPREHENSIVE METABOLIC PANEL
ALT: 33 U/L (ref 0–44)
AST: 25 U/L (ref 15–41)
Albumin: 4.4 g/dL (ref 3.5–5.0)
Alkaline Phosphatase: 64 U/L (ref 38–126)
Anion gap: 11 (ref 5–15)
BUN: 13 mg/dL (ref 8–23)
CO2: 22 mmol/L (ref 22–32)
Calcium: 9.3 mg/dL (ref 8.9–10.3)
Chloride: 106 mmol/L (ref 98–111)
Creatinine, Ser: 0.98 mg/dL (ref 0.44–1.00)
GFR, Estimated: >60 mL/min (ref >60)
Glucose, Bld: 208 mg/dL — ABNORMAL HIGH (ref 70–99)
Potassium: 3.7 mmol/L (ref 3.5–5.1)
Sodium: 139 mmol/L (ref 135–145)
Total Bilirubin: 0.7 mg/dL (ref 0.3–1.2)
Total Protein: 7.2 g/dL (ref 6.5–8.1)

## 2023-07-07 MED ORDER — ANASTROZOLE 1 MG PO TABS: 1.0000 mg | ORAL_TABLET | Freq: Every day | ORAL | 1 refills | Status: DC

## 2023-07-07 NOTE — Progress Notes (Addendum)
Hematology/Oncology Progress note [phone] [phone]      Patient Care Team: Dana Allan, MD as PCP - General (Family Medicine) Scarlett Presto, RN (Inactive) as Oncology Nurse Navigator Carolan Shiver, MD as Consulting Physician (General Surgery) Rickard Patience, MD as Consulting Physician (Oncology) Carmina Miller, MD as Consulting Physician (Radiation Oncology)  ASSESSMENT & PLAN:   Cancer Staging  Invasive carcinoma of breast [facility]) Staging form: Breast, AJCC 8th Edition - Clinical stage from 05/16/2021: Stage IIB (cT3, cN0, cM0, G3, ER+, PR+, HER2-) - Signed by Rickard Patience, MD on 06/03/2021   Invasive carcinoma of breast (HCC) Left breast invasive carcinoma, multiple sites.  ER 90%, PR 11-50%, HER2-cT3 N0, Ki-67 was 20%.  neoadjuvant chemotherapy ddAC x 4 followed by weekly Taxol--followed left mastectomy ypT2 pN0- adjuvant RT due to pectoralis muscle abut - lymph node negative disease.no need for adjuvant abemaciclib Currently on endocrine therapy with Arimidex.  She tolerates well. Continue Arimidex 1mg  daily, refills were sent.  annual screening mammogram of right breast. Dec 2024   Aromatase inhibitor use Recommend calcium 1200 mg daily with Vitamin D supplementation Baseline DEXA 2022 - normal- repeat in June 2024 -Normal  Patient declined  adjuvant bisphosphonate treatment  Port-A-Cath in place recommend port flush every 6-8 weeks Consider port removal in March/April 2025  Orders Placed This Encounter  Procedures   MM 3D SCREENING MAMMOGRAM UNILATERAL RIGHT BREAST    Standing Status:   Future    Standing Expiration Date:   07/06/2024    Order Specific Question:   Reason for Exam (SYMPTOM  OR DIAGNOSIS REQUIRED)    Answer:   Breast Cancer    Order Specific Question:   Preferred imaging location?    Answer:   [facility]   CBC with Differential (Cancer Center Only)    Standing Status:   Future    Standing Expiration Date:    07/06/2024   CMP (Cancer Center only)    Standing Status:   Future    Standing Expiration Date:   07/06/2024   Cancer antigen 27.29    Standing Status:   Future    Standing Expiration Date:   07/06/2024   Cancer antigen 15-3    Standing Status:   Future    Standing Expiration Date:   07/06/2024   Lab MD 6 months flex, cbc cmp All questions were answered. The patient knows to call the clinic with any problems, questions or concerns.  Rickard Patience, MD, PhD [facility] Hematology Oncology 07/07/2023   CHIEF COMPLAINTS/REASON FOR VISIT:  multifocal left breast cancer  HISTORY OF PRESENTING ILLNESS:   Tammy Nunez is a  72 y.o.  female presents for follow up of multifocal left breast cancer.  Oncology summary listed below.  Oncology History  Breast cancer in female [facility]) (Resolved)  05/08/2021 Initial Diagnosis   Breast cancer in female [facility])   06/03/2021 - 06/03/2021 Chemotherapy         06/21/2021 - 01/21/2022 Chemotherapy   Patient is on Treatment Plan : BREAST ADJUVANT DOSE DENSE AC q14d / PACLitaxel q7d     Invasive carcinoma of breast (HCC)  05/02/2021 Mammogram   left breast diagnostic mammogram and ultrasound showed multiple irregular mass in the left breast. Mammogram mass 1 irregular mass associated with distortion in the upper breast at middle depth, multiple adjacent and possible continuous irregular masses, conglomeration of masses measures at least 5.6 cm.  Associated pleomorphic calcifications extending at least 5 x 3.9 x 3.7 cm Mass 2, 1  cm mass in the upper breast at the middle to posterior depth. Mass 3, 5 mm irregular mass in the upper breast at posterior depth. In total, mass 1-mass 3 span at least 8.2 cm in anterior-posterior extent.   By ultrasound  mass 1 12:00 5 cm from nipple, 2.7 x 1.5 x 4.3 cm Mass 2, 12:00 12 cm from nipple, 0.9 x 0.9 x 0.5 cm Mass 3   12:00 14 cm from nipple, 0.4 x 0.4 x 0.4 cm-uses 2.4 cm superior to mass to Mass 4   11:00 No suspicious  left axillary adenopathy.   05/07/2021 Initial Diagnosis   Invasive carcinoma of breast  - Ultrasound-guided breast biopsy Breast left 12:00 mass 5 cm from nipple biopsy showed invasive mammary carcinoma, no special type, grade 3, high-grade DCIS present, no LVI, ER 90%, PR 11-50%, HER2 negative by IHC Breast left 11:00 mass 10 cm from nipple biopsy showed invasive mammary carcinoma, no special type, grade 2, no DCIS/LVI identified.ER 90%, PR 11-50%, HER2 negative by [facility]     05/16/2021 Cancer Staging   Staging form: Breast, AJCC 8th Edition - Clinical stage from 05/16/2021: Stage IIB (cT3, cN0, cM0, G3, ER+, PR+, HER2-) - Signed by Rickard Patience, MD on 06/03/2021 Stage prefix: Initial diagnosis Histologic grading system: 3 grade system   05/22/2021 Imaging   MRI breast showed that nonmass like malignancy spanning over 8 cm, abuting anterior aspect of pectoralis muscle. Discussed with Dr.Cintron and we agree that she will need mastectomy. There is concern of possible positive margin. I recommend neoadjuvant chemotherapy   06/06/2021 Miscellaneous   Medi port was placed by Dr.Cintron   06/13/2021 Echocardiogram   pre chemotherapy Echo. LVEF 60-65%.  Normal global longitudinal strain. Grade 1 diastolic dysfunction   06/21/2021 - 10/25/2021 Chemotherapy   ddAC x 4 with GCSF followed by weekly Taxol x 12 Chemotherapy break due to side effects, UTI and the patient's preference.   02/17/2022 Surgery   Patient underwent left simple mastectomy, and a sentinel lymph node biopsy.  Pathology showed invasive mammary carcinoma, high-grade DCIS, 4 lymph nodes were sampled and were negative for cancer. ypT2 pN0, grade 3, multifocal invasive carcinoma.  All margins are negative for invasive carcinoma and DCIS. Ki-67 was 20%.   04/29/2022 - 06/03/2022 Radiation Therapy   Due to the initial mass potentially abutting the pectoralis muscle, she received adjuvant chest wall radiation   06/13/2022 -  Anti-estrogen oral  therapy   Start on Arimidex 1mg  daily     INTERVAL HISTORY Tammy Kubisiak is a 72 y.o. female who has above history reviewed by me today presents for follow up visit for  multifocal left breast cancer Patient reports reports feeling well.  Gained weight.  Patient tolerates Arimidex well. Manageable hot flash No new complains.   Review of Systems  Constitutional:  Negative for appetite change, chills, fatigue and fever.  HENT:   Negative for hearing loss and voice change.   Eyes:  Negative for eye problems.  Respiratory:  Negative for chest tightness and cough.   Cardiovascular:  Negative for chest pain.  Gastrointestinal:  Negative for abdominal distention, abdominal pain, blood in stool, diarrhea and nausea.  Endocrine: Negative for hot flashes.  Genitourinary:  Negative for difficulty urinating, dysuria and frequency.   Musculoskeletal:  Negative for arthralgias.  Skin:  Negative for itching and rash.  Neurological:  Negative for extremity weakness.  Hematological:  Negative for adenopathy. Does not bruise/bleed easily.  Psychiatric/Behavioral:  Negative for confusion.  MEDICAL HISTORY:  Past Medical History:  Diagnosis Date   AKI (acute kidney injury) (HCC)    Anemia    Annual physical exam 10/09/2021   Breast cancer (HCC) 02/17/2022   Breast cancer (HCC) 02/17/2022   Breast cancer in female [facility]) 05/08/2021   Breast cancer in female [facility]) 05/08/2021   Surgery appt sch 05/09/21 Dr. Awanda [facility]         DIAGNOSIS:  A. BREAST, LEFT, 12 O'CLOCK, 5 CM FROM NIPPLE; ULTRASOUND-GUIDED CORE  BIOPSY:  - INVASIVE MAMMARY CARCINOMA, NO SPECIAL TYPE.  Size of invasive carcinoma: 2.5 mm in this sample  Histologic grade of invasive carcinoma: Grade 3                       Glandular/tubular differentiation score: 3                       Nu   Carotid stenosis, symptomatic, with infarction (HCC) 07/20/2019   Chemotherapy induced nausea and vomiting 06/28/2021    Chronic left shoulder pain    Coronary artery disease    x 1 stent   Diabetes mellitus without complication (HCC)    Dysrhythmia    Essential hypertension    Hypertension    Hypomagnesemia 09/13/2021   Invasive carcinoma of breast (HCC) 05/08/2021   New onset type 2 diabetes mellitus (HCC)    Numbness 06/03/2021   Obesity, diabetes, and hypertension syndrome (HCC) 10/11/2020   Paralysis (HCC)    weakness left upper amd lower extremity   PONV (postoperative nausea and vomiting)    Seizures (HCC)    Stroke [facility])     SURGICAL HISTORY: Past Surgical History:  Procedure Laterality Date   BREAST BIOPSY Left 05/07/2021   12:00 5cmfn vision - INVASIVE MAMMARY CARCINOMA,   BREAST BIOPSY Left 05/07/2021   11:00 10 cmfn mini cork clip INVASIVE MAMMARY CARCINOMA,   CAROTID PTA/STENT INTERVENTION Left 07/20/2019   Procedure: CAROTID PTA/STENT INTERVENTION;  Surgeon: Renford Dills, MD;  Location: [facility] INVASIVE CV LAB;  Service: Cardiovascular;  Laterality: Left;   ECTOPIC PREGNANCY SURGERY     PORTACATH PLACEMENT N/A 06/06/2021   Procedure: INSERTION PORT-A-CATH;  Surgeon: Carolan Shiver, MD;  Location: [facility] ORS;  Service: General;  Laterality: N/A;   SENTINEL NODE BIOPSY Left 02/17/2022   Procedure: SENTINEL NODE BIOPSY;  Surgeon: Carolan Shiver, MD;  Location: [facility] ORS;  Service: General;  Laterality: Left;   TOTAL MASTECTOMY Left 02/17/2022   Procedure: TOTAL MASTECTOMY;  Surgeon: Carolan Shiver, MD;  Location: [facility] ORS;  Service: General;  Laterality: Left;    SOCIAL HISTORY: Social History   Socioeconomic History   Marital status: Married    Spouse name: Ivar Drape   Number of children: Not on file   Years of education: Not on file   Highest education level: Not on file  Occupational History   Not on file  Tobacco Use   Smoking status: Former    Current packs/day: 0.00    Average packs/day: 0.5 packs/day for 15.0 years (7.5 ttl pk-yrs)    Types:  Cigarettes    Start date: 04/10/2004    Quit date: 04/11/2019    Years since quitting: 4.2   Smokeless tobacco: Never  Vaping Use   Vaping status: Never Used  Substance and Sexual Activity   Alcohol use: Never   Drug use: Never   Sexual activity: Not Currently  Other Topics Concern   Not on file  Social  History Narrative   No kids    Married with husband    Used to work [employer], dispensing    Social Determinants of Health   Financial Resource Strain: Low Risk  (09/04/2022)   Overall Financial Resource Strain (CARDIA)    Difficulty of Paying Living Expenses: Not hard at all  Food Insecurity: No Food Insecurity (09/04/2022)   Hunger Vital Sign    Worried About Running Out of Food in the Last Year: Never true    Ran Out of Food in the Last Year: Never true  Transportation Needs: No Transportation Needs (09/04/2022)   PRAPARE - Administrator, Civil Service (Medical): No    Lack of Transportation (Non-Medical): No  Physical Activity: Sufficiently Active (09/04/2022)   Exercise Vital Sign    Days of Exercise per Week: 7 days    Minutes of Exercise per Session: 30 min  Stress: No Stress Concern Present (09/04/2022)   [employer] of Occupational Health - Occupational Stress Questionnaire    Feeling of Stress : Not at all  Social Connections: Socially Integrated (09/04/2022)   Social Connection and Isolation Panel [NHANES]    Frequency of Communication with Friends and Family: Three times a week    Frequency of Social Gatherings with Friends and Family: Once a week    Attends Religious Services: 1 to 4 times per year    Active Member of [employer] or Organizations: Yes    Attends Banker Meetings: 1 to 4 times per year    Marital Status: Married  Catering manager Violence: Not At Risk (09/04/2022)   Humiliation, Afraid, Rape, and Kick questionnaire    Fear of Current or Ex-Partner: No    Emotionally Abused: No    Physically Abused: No     Sexually Abused: No    FAMILY HISTORY: Family History  Problem Relation Age of Onset   Diabetes type II Mother    Hypertension Father    Heart attack Father    Diabetes type II Brother    Breast cancer Neg Hx     ALLERGIES:  has No Known Allergies.  MEDICATIONS:  Current Outpatient Medications  Medication Sig Dispense Refill   amLODipine (NORVASC) 5 MG tablet Take 1 tablet (5 mg total) by mouth daily. 90 tablet 3   aspirin EC 81 MG tablet Take 1 tablet (81 mg total) by mouth daily. 90 tablet 3   atorvastatin (LIPITOR) 80 MG tablet Take 1 tablet (80 mg total) by mouth daily. 90 tablet 3   blood glucose meter kit and supplies 1 each by Other route 4 (four) times daily. Dispense based on patient and insurance preference. Four times daily as directed. (FOR ICD-10 E10.9, E11.9). 1 each 0   calcium carbonate (OS-CAL) 600 MG tablet Take 2 tablets (1,200 mg total) by mouth daily. 180 tablet 3   carvedilol (COREG) 25 MG tablet Take 1 tablet (25 mg total) by mouth 2 (two) times daily with a meal. 180 tablet 3   clopidogrel (PLAVIX) 75 MG tablet Take 1 tablet (75 mg total) by mouth daily. 90 tablet 3   Continuous Glucose Sensor (FREESTYLE LIBRE 2 SENSOR) MISC USE TO CHECK BLOOD SUGAR AT LEAST EVERY 8 HOURS 6 each 5   empagliflozin (JARDIANCE) 25 MG TABS tablet Take 1 tablet (25 mg total) by mouth daily. 90 tablet 3   ezetimibe (ZETIA) 10 MG tablet Take 1 tablet (10 mg total) by mouth daily. 90 tablet 3   lidocaine-prilocaine (EMLA)  cream APPLY  CREAM TOPICALLY TO AFFECTED AREA AS NEEDED PRIOR TO PORT ACCESS. 30 g 1   lisinopril (ZESTRIL) 40 MG tablet Take 1 tablet (40 mg total) by mouth daily. 90 tablet 3   metFORMIN (GLUCOPHAGE-XR) 500 MG 24 hr tablet Take 1 tablet (500 mg total) by mouth 2 (two) times daily with a meal. 180 tablet 3   Multiple Vitamin (MULTIVITAMIN) tablet Take 1 tablet by mouth daily.     vitamin B-12 (CYANOCOBALAMIN) 1000 MCG tablet Take 1 tablet (1,000 mcg total) by mouth  daily. 30 tablet 1   Vitamin D, Cholecalciferol, 25 MCG (1000 UT) TABS Take 1,000 Units by mouth.     anastrozole (ARIMIDEX) 1 MG tablet Take 1 tablet (1 mg total) by mouth daily. 90 tablet 1   No current facility-administered medications for this visit.   Facility-Administered Medications Ordered in Other Visits  Medication Dose Route Frequency Provider Last Rate Last Admin   heparin lock flush 100 UNIT/ML injection              PHYSICAL EXAMINATION: ECOG PERFORMANCE STATUS: 2 - Symptomatic, <50% confined to bed Vitals:   07/07/23 1018  BP: (!) 175/58  Pulse: 79  Resp: 18  Temp: (!) 97.3 F (36.3 C)  SpO2: 100%    Filed Weights   07/07/23 1018  Weight: 164 lb (74.4 kg)      Physical Exam Constitutional:      General: She is not in acute distress. HENT:     Head: Normocephalic and atraumatic.  Eyes:     General: No scleral icterus. Cardiovascular:     Rate and Rhythm: Normal rate and regular rhythm.     Heart sounds: Normal heart sounds.  Pulmonary:     Effort: Pulmonary effort is normal. No respiratory distress.     Breath sounds: No wheezing.  Abdominal:     General: Bowel sounds are normal. There is no distension.     Palpations: Abdomen is soft.  Musculoskeletal:        General: No deformity. Normal range of motion.     Cervical back: Normal range of motion and neck supple.  Skin:    General: Skin is warm and dry.     Findings: No erythema or rash.  Neurological:     Mental Status: She is alert and oriented to person, place, and time. Mental status is at baseline.     Comments: Chronic left upper and lower extremity weakness.  Psychiatric:        Mood and Affect: Mood normal.       LABORATORY DATA:  I have reviewed the data as listed Lab Results  Component Value Date   WBC 5.5 07/07/2023   HGB 13.7 07/07/2023   HCT 42.1 07/07/2023   MCV 97.2 07/07/2023   PLT 158 07/07/2023   Recent Labs    09/02/22 1238 01/06/23 0923 02/11/23 0837  07/07/23 1002  NA 139 137 138 139  K 4.1 3.9 4.5 3.7  CL 108 104 101 106  CO2 23 21* 26 22  GLUCOSE 114* 202* 163* 208*  BUN 26* 14 16 13   CREATININE 0.81 0.86 1.04 0.98  CALCIUM 10.3 9.5 10.4 9.3  GFRNONAA >60 >60  --  >60  PROT 7.1 7.2 6.9 7.2  ALBUMIN 4.3 4.3 4.4 4.4  AST 22 28 21 25   ALT 20 23 23  33  ALKPHOS 56 64 80 64  BILITOT 0.6 0.5 0.6 0.7   Iron/TIBC/Ferritin/ %Sat  Component Value Date/Time   IRON 113 12/08/2019 1015   TIBC 317 12/08/2019 1015   FERRITIN 67 12/08/2019 1015   IRONPCTSAT 36 12/08/2019 1015       RADIOGRAPHIC STUDIES: I have personally reviewed the radiological images as listed and agreed with the findings in the report. DG Bone Density  Result Date: 05/19/2023 EXAM: DUAL X-RAY ABSORPTIOMETRY (DXA) FOR BONE MINERAL DENSITY IMPRESSION: Your patient Promise Weldin completed a BMD test on 05/19/2023 using the [employer] Advance DXA System (analysis version: 14.10) manufactured by [employer]. The following summarizes the results of our evaluation. Technologist: LCE PATIENT BIOGRAPHICAL: Name: Leland, Staszewski Patient ID: [id number] Birth Date: 1951/08/11 Height: 61.0 in. Gender: Female Exam Date: 05/19/2023 Weight: 168.0 lbs. Indications: Advanced Age, Diabetes, hx stroke, POSTmenopausal Fractures: Treatments: 81 MG ASPIRIN, Calcium, Jardiance, Metformin, Plavix, Vitamin D ASSESSMENT: The BMD measured at Femur Neck Left is 0.945 g/cm2 with a T-score of -0.7. This patient is considered normal according to World Health Organization [facility]) criteria. Lumbar spine was not utilized due to advanced degenerative changes. The scan quality is good. Site Region Measured Measured WHO Young Adult BMD Date       Age      Classification T-score DualFemur Neck Left 05/19/2023 72.2 Normal -0.7 0.945 g/cm2 DualFemur Neck Left 04/24/2021 70.2 Normal -0.8 0.926 g/cm2 DualFemur Total Mean 05/19/2023 72.2 Normal 1.0 1.136 g/cm2 DualFemur Total Mean 04/24/2021 70.2 Normal 1.2  1.162 g/cm2 Right Forearm Radius 33% 05/19/2023 72.2 Normal 0.7 0.940 g/cm2 Right Forearm Radius 33% 04/24/2021 70.2 Normal 0.4 0.911 g/cm2 World Health Organization [facility]) criteria for post-menopausal, Caucasian Women: Normal:       T-score at or above -1 SD Osteopenia:   T-score between -1 and -2.5 SD Osteoporosis: T-score at or below -2.5 SD RECOMMENDATIONS: 1. All patients should optimize calcium and vitamin D intake. 2. Consider FDA-approved medical therapies in postmenopausal women and men aged 86 years and older, based on the following: a. A hip or vertebral (clinical or morphometric) fracture b. T-score < -2.5 at the femoral neck or spine after appropriate evaluation to exclude secondary causes c. Low bone mass (T-score between -1.0 and -2.5 at the femoral neck or spine) and a 10-year probability of a hip fracture > 3% or a 10-year probability of a major osteoporosis-related fracture > 20% based on the [place]-adapted WHO algorithm d. Clinician judgment and/or patient preferences may indicate treatment for people with 10-year fracture probabilities above or below these levels FOLLOW-UP: People with diagnosed cases of osteoporosis or at high risk for fracture should have regular bone mineral density tests. For patients eligible for Medicare, routine testing is allowed once every 2 years. The testing frequency can be increased to one year for patients who have rapidly progressing disease, those who are receiving or discontinuing medical therapy to restore bone mass, or have additional risk factors. I have reviewed this report, and agree with the above findings. [facility] Radiology Electronically Signed   By: Romona Curls M.D.   On: 05/19/2023 10:22

## 2023-07-07 NOTE — Assessment & Plan Note (Addendum)
Recommend calcium 1200 mg daily with Vitamin D supplementation Baseline DEXA 2022 - normal- repeat in June 2024 -Normal  Patient declined  adjuvant bisphosphonate treatment

## 2023-07-07 NOTE — Assessment & Plan Note (Addendum)
recommend port flush every 6-8 weeks Consider port removal in March/April 2025

## 2023-07-07 NOTE — Assessment & Plan Note (Addendum)
Left breast invasive carcinoma, multiple sites.  ER 90%, PR 11-50%, HER2-cT3 N0, Ki-67 was 20%.  neoadjuvant chemotherapy ddAC x 4 followed by weekly Taxol--followed left mastectomy ypT2 pN0- adjuvant RT due to pectoralis muscle abut - lymph node negative disease.no need for adjuvant abemaciclib Currently on endocrine therapy with Arimidex.  She tolerates well. Continue Arimidex 1mg  daily, refills were sent.  annual screening mammogram of right breast. Dec 2024

## 2023-07-28 ENCOUNTER — Encounter: Payer: Self-pay | Admitting: Oncology

## 2023-07-29 ENCOUNTER — Inpatient Hospital Stay: Payer: PPO | Attending: Radiation Oncology

## 2023-07-29 DIAGNOSIS — C50812 Malignant neoplasm of overlapping sites of left female breast: Secondary | ICD-10-CM | POA: Insufficient documentation

## 2023-07-29 DIAGNOSIS — Z452 Encounter for adjustment and management of vascular access device: Secondary | ICD-10-CM | POA: Diagnosis present

## 2023-07-29 DIAGNOSIS — Z95828 Presence of other vascular implants and grafts: Secondary | ICD-10-CM

## 2023-07-29 DIAGNOSIS — Z170 Estrogen receptor positive status [ER+]: Secondary | ICD-10-CM | POA: Insufficient documentation

## 2023-07-29 MED ORDER — SODIUM CHLORIDE 0.9% FLUSH
10.0000 mL | Freq: Once | INTRAVENOUS | Status: AC
  Administered 2023-07-29: 10 mL via INTRAVENOUS
  Filled 2023-07-29: qty 10

## 2023-07-29 MED ORDER — HEPARIN SOD (PORK) LOCK FLUSH 100 UNIT/ML IV SOLN
500.0000 [IU] | Freq: Once | INTRAVENOUS | Status: AC
  Administered 2023-07-29: 500 [IU] via INTRAVENOUS
  Filled 2023-07-29: qty 5

## 2023-09-01 ENCOUNTER — Telehealth: Payer: Self-pay | Admitting: Family Medicine

## 2023-09-01 NOTE — Telephone Encounter (Signed)
Copied from CRM [id number]. Topic: Medicare AWV >> Sep 01, 2023  3:31 PM Payton Doughty wrote: Reason for CRM: Called LVM 09/01/2023 to schedule AWV   Verlee Rossetti; Care Guide Ambulatory Clinical Support [place] [facility] Direct Dial: [phone]

## 2023-09-07 ENCOUNTER — Telehealth: Payer: Self-pay | Admitting: Family Medicine

## 2023-09-07 NOTE — Telephone Encounter (Signed)
Copied from CRM [id number]. Topic: Medicare AWV >> Sep 07, 2023 11:43 AM Payton Doughty wrote: Reason for CRM: Called LVM 09/07/2023 to schedule Annual Wellness Visit  Verlee Rossetti; Care Guide Ambulatory Clinical Support [place] l [facility] Direct Dial: [phone]

## 2023-09-23 ENCOUNTER — Inpatient Hospital Stay: Payer: PPO | Attending: Radiation Oncology

## 2023-09-23 DIAGNOSIS — C50812 Malignant neoplasm of overlapping sites of left female breast: Secondary | ICD-10-CM | POA: Insufficient documentation

## 2023-09-23 DIAGNOSIS — Z170 Estrogen receptor positive status [ER+]: Secondary | ICD-10-CM | POA: Diagnosis not present

## 2023-09-23 DIAGNOSIS — Z95828 Presence of other vascular implants and grafts: Secondary | ICD-10-CM

## 2023-09-23 MED ORDER — HEPARIN SOD (PORK) LOCK FLUSH 100 UNIT/ML IV SOLN
500.0000 [IU] | Freq: Once | INTRAVENOUS | Status: AC
  Administered 2023-09-23: 500 [IU] via INTRAVENOUS
  Filled 2023-09-23: qty 5

## 2023-09-23 MED ORDER — SODIUM CHLORIDE 0.9% FLUSH
10.0000 mL | Freq: Once | INTRAVENOUS | Status: AC
  Administered 2023-09-23: 10 mL via INTRAVENOUS
  Filled 2023-09-23: qty 10

## 2023-11-09 ENCOUNTER — Ambulatory Visit
Admission: RE | Admit: 2023-11-09 | Discharge: 2023-11-09 | Disposition: A | Discharge status: Home / Self Care | Payer: PPO | Source: Ambulatory Visit | Attending: Oncology | Admitting: Oncology

## 2023-11-09 DIAGNOSIS — C50919 Malignant neoplasm of unspecified site of unspecified female breast: Secondary | ICD-10-CM

## 2023-11-09 DIAGNOSIS — R92331 Mammographic heterogeneous density, right breast: Secondary | ICD-10-CM | POA: Diagnosis not present

## 2023-11-09 DIAGNOSIS — Z853 Personal history of malignant neoplasm of breast: Secondary | ICD-10-CM | POA: Insufficient documentation

## 2023-11-09 DIAGNOSIS — Z1231 Encounter for screening mammogram for malignant neoplasm of breast: Secondary | ICD-10-CM | POA: Insufficient documentation

## 2023-11-20 ENCOUNTER — Inpatient Hospital Stay: Payer: PPO | Attending: Radiation Oncology

## 2023-11-20 DIAGNOSIS — C50812 Malignant neoplasm of overlapping sites of left female breast: Secondary | ICD-10-CM | POA: Insufficient documentation

## 2023-11-20 DIAGNOSIS — Z79811 Long term (current) use of aromatase inhibitors: Secondary | ICD-10-CM | POA: Insufficient documentation

## 2023-11-20 DIAGNOSIS — Z170 Estrogen receptor positive status [ER+]: Secondary | ICD-10-CM | POA: Diagnosis not present

## 2023-11-20 DIAGNOSIS — Z95828 Presence of other vascular implants and grafts: Secondary | ICD-10-CM

## 2023-11-20 DIAGNOSIS — Z452 Encounter for adjustment and management of vascular access device: Secondary | ICD-10-CM | POA: Diagnosis present

## 2023-11-20 MED ORDER — SODIUM CHLORIDE 0.9% FLUSH
10.0000 mL | Freq: Once | INTRAVENOUS | Status: AC
  Administered 2023-11-20: 10 mL via INTRAVENOUS
  Filled 2023-11-20: qty 10

## 2023-11-20 MED ORDER — HEPARIN SOD (PORK) LOCK FLUSH 100 UNIT/ML IV SOLN
500.0000 [IU] | Freq: Once | INTRAVENOUS | Status: AC
Start: 2023-11-20 — End: 2023-11-20
  Administered 2023-11-20: 500 [IU] via INTRAVENOUS
  Filled 2023-11-20: qty 5

## 2023-11-26 ENCOUNTER — Ambulatory Visit: Payer: PPO | Admitting: Family Medicine

## 2023-12-08 ENCOUNTER — Other Ambulatory Visit (INDEPENDENT_AMBULATORY_CARE_PROVIDER_SITE_OTHER): Payer: Self-pay | Admitting: Nurse Practitioner

## 2023-12-08 DIAGNOSIS — I6523 Occlusion and stenosis of bilateral carotid arteries: Secondary | ICD-10-CM

## 2023-12-12 NOTE — Progress Notes (Unsigned)
MRN : [id number]  Tammy Nunez is a 73 y.o. (May 13, 1951) female who presents with chief complaint of check carotid arteries.  History of Present Illness:   The patient is seen for follow up evaluation of carotid stenosis status right carotid stent on 07/20/2019.  There were no post operative problems or complications related to the surgery.  The patient denies neck or incisional pain.   The patient denies interval amaurosis fugax. There is no recent history of TIA symptoms or focal motor deficits. There is no prior documented CVA.   The patient denies headache.   The patient is taking enteric-coated aspirin 81 mg daily.   The patient has a history of coronary artery disease, no recent episodes of angina or shortness of breath. The patient denies PAD or claudication symptoms. There is a history of hyperlipidemia which is being treated with a statin.    Carotid Duplex done today shows 1-39% RICA s/p stent and 1-39% LICA.  No change compared to last study in 12/15/2022.   No outpatient medications have been marked as taking for the 12/14/23 encounter (Appointment) with Gilda Crease, Latina Craver, MD.    Past Medical History:  Diagnosis Date   AKI (acute kidney injury) [facility])    Anemia    Annual physical exam 10/09/2021   Breast cancer (HCC) 02/17/2022   Breast cancer (HCC) 02/17/2022   Breast cancer in female [facility]) 05/08/2021   Breast cancer in female [facility]) 05/08/2021   Surgery appt sch 05/09/21 Dr. Awanda [facility]         DIAGNOSIS:  A. BREAST, LEFT, 12 O'CLOCK, 5 CM FROM NIPPLE; ULTRASOUND-GUIDED CORE  BIOPSY:  - INVASIVE MAMMARY CARCINOMA, NO SPECIAL TYPE.  Size of invasive carcinoma: 2.5 mm in this sample  Histologic grade of invasive carcinoma: Grade 3                       Glandular/tubular differentiation score: 3                       Nu   Carotid stenosis, symptomatic, with infarction (HCC) 07/20/2019   Chemotherapy induced nausea and  vomiting 06/28/2021   Chronic left shoulder pain    Coronary artery disease    x 1 stent   Diabetes mellitus without complication (HCC)    Dysrhythmia    Essential hypertension    Hypertension    Hypomagnesemia 09/13/2021   Invasive carcinoma of breast (HCC) 05/08/2021   New onset type 2 diabetes mellitus (HCC)    Numbness 06/03/2021   Obesity, diabetes, and hypertension syndrome (HCC) 10/11/2020   Paralysis (HCC)    weakness left upper amd lower extremity   PONV (postoperative nausea and vomiting)    Seizures (HCC)    Stroke [facility])     Past Surgical History:  Procedure Laterality Date   BREAST BIOPSY Left 05/07/2021   12:00 5cmfn vision - INVASIVE MAMMARY CARCINOMA,   BREAST BIOPSY Left 05/07/2021   11:00 10 cmfn mini cork clip INVASIVE MAMMARY CARCINOMA,   CAROTID PTA/STENT INTERVENTION Left 07/20/2019   Procedure: CAROTID PTA/STENT INTERVENTION;  Surgeon: Renford Dills, MD;  Location: [facility] INVASIVE CV LAB;  Service: Cardiovascular;  Laterality: Left;   ECTOPIC PREGNANCY SURGERY     PORTACATH PLACEMENT N/A 06/06/2021   Procedure: INSERTION PORT-A-CATH;  Surgeon: Carolan Shiver, MD;  Location: [facility] ORS;  Service: General;  Laterality: N/A;   SENTINEL NODE BIOPSY Left 02/17/2022   Procedure: SENTINEL NODE BIOPSY;  Surgeon: Carolan Shiver, MD;  Location: [facility] ORS;  Service: General;  Laterality: Left;   TOTAL MASTECTOMY Left 02/17/2022   Procedure: TOTAL MASTECTOMY;  Surgeon: Carolan Shiver, MD;  Location: [facility] ORS;  Service: General;  Laterality: Left;    Social History Social History   Tobacco Use   Smoking status: Former    Current packs/day: 0.00    Average packs/day: 0.5 packs/day for 15.0 years (7.5 ttl pk-yrs)    Types: Cigarettes    Start date: 04/10/2004    Quit date: 04/11/2019    Years since quitting: 4.6   Smokeless tobacco: Never  Vaping Use   Vaping status: Never Used  Substance Use Topics   Alcohol use: Never   Drug use: Never     Family History Family History  Problem Relation Age of Onset   Diabetes type II Mother    Hypertension Father    Heart attack Father    Diabetes type II Brother    Breast cancer Neg Hx     No Known Allergies   REVIEW OF SYSTEMS (Negative unless checked)  Constitutional: [] Weight loss  [] Fever  [] Chills Cardiac: [] Chest pain   [] Chest pressure   [] Palpitations   [] Shortness of breath when laying flat   [] Shortness of breath with exertion. Vascular:  [x] Pain in legs with walking   [] Pain in legs at rest  [] History of DVT   [] Phlebitis   [] Swelling in legs   [] Varicose veins   [] Non-healing ulcers Pulmonary:   [] Uses home oxygen   [] Productive cough   [] Hemoptysis   [] Wheeze  [] COPD   [] Asthma Neurologic:  [] Dizziness   [] Seizures   [] History of stroke   [] History of TIA  [] Aphasia   [] Vissual changes   [] Weakness or numbness in arm   [] Weakness or numbness in leg Musculoskeletal:   [] Joint swelling   [] Joint pain   [] Low back pain Hematologic:  [] Easy bruising  [] Easy bleeding   [] Hypercoagulable state   [] Anemic Gastrointestinal:  [] Diarrhea   [] Vomiting  [] Gastroesophageal reflux/heartburn   [] Difficulty swallowing. Genitourinary:  [] Chronic kidney disease   [] Difficult urination  [] Frequent urination   [] Blood in urine Skin:  [] Rashes   [] Ulcers  Psychological:  [] History of anxiety   []  History of major depression.  Physical Examination  There were no vitals filed for this visit. There is no height or weight on file to calculate BMI. Gen: WD/WN, NAD Head: [place]/AT, No temporalis wasting.  Ear/Nose/Throat: Hearing grossly intact, nares w/o erythema or drainage Eyes: PER, EOMI, sclera nonicteric.  Neck: Supple, no masses.  No bruit or JVD.  Pulmonary:  Good air movement, no audible wheezing, no use of accessory muscles.  Cardiac: RRR, normal S1, S2, no Murmurs. Vascular:  carotid bruit noted Vessel Right Left  Radial Palpable Palpable  Carotid  Palpable  Palpable  Subclav   Palpable Palpable  Gastrointestinal: soft, non-distended. No guarding/no peritoneal signs.  Musculoskeletal: M/S 5/5 throughout.  No visible deformity.  Neurologic: CN 2-12 intact. Pain and light touch intact in extremities.  Symmetrical.  Speech is fluent. Motor exam as listed above. Psychiatric: Judgment intact, Mood & affect appropriate for pt's clinical situation. Dermatologic: No rashes or ulcers noted.  No changes consistent with cellulitis.   CBC Lab Results  Component Value Date   WBC 5.5 07/07/2023   HGB 13.7 07/07/2023   HCT 42.1 07/07/2023  MCV 97.2 07/07/2023   PLT 158 07/07/2023    BMET    Component Value Date/Time   NA 139 07/07/2023 1002   K 3.7 07/07/2023 1002   CL 106 07/07/2023 1002   CO2 22 07/07/2023 1002   GLUCOSE 208 (H) 07/07/2023 1002   BUN 13 07/07/2023 1002   CREATININE 0.98 07/07/2023 1002   CALCIUM 9.3 07/07/2023 1002   GFRNONAA >60 07/07/2023 1002   GFRAA >60 07/21/2019 0501   CrCl cannot be calculated (Patient's most recent lab result is older than the maximum 21 days allowed.).  COAG Lab Results  Component Value Date   INR 1.0 06/06/2019    Radiology No results found.   Assessment/Plan There are no diagnoses linked to this encounter.   Levora Dredge, MD  12/12/2023 2:53 PM

## 2023-12-14 ENCOUNTER — Ambulatory Visit (INDEPENDENT_AMBULATORY_CARE_PROVIDER_SITE_OTHER): Payer: PPO | Admitting: Vascular Surgery

## 2023-12-14 ENCOUNTER — Ambulatory Visit (INDEPENDENT_AMBULATORY_CARE_PROVIDER_SITE_OTHER): Payer: PPO

## 2023-12-14 ENCOUNTER — Encounter (INDEPENDENT_AMBULATORY_CARE_PROVIDER_SITE_OTHER): Payer: Self-pay | Admitting: Vascular Surgery

## 2023-12-14 VITALS — BP 168/81 | HR 71 | Resp 18 | Ht 61.0 in | Wt 160.0 lb

## 2023-12-14 DIAGNOSIS — I6340 Cerebral infarction due to embolism of unspecified cerebral artery: Secondary | ICD-10-CM

## 2023-12-14 DIAGNOSIS — I152 Hypertension secondary to endocrine disorders: Secondary | ICD-10-CM

## 2023-12-14 DIAGNOSIS — E1159 Type 2 diabetes mellitus with other circulatory complications: Secondary | ICD-10-CM

## 2023-12-14 DIAGNOSIS — I6523 Occlusion and stenosis of bilateral carotid arteries: Secondary | ICD-10-CM

## 2023-12-14 DIAGNOSIS — E1169 Type 2 diabetes mellitus with other specified complication: Secondary | ICD-10-CM | POA: Diagnosis not present

## 2023-12-14 DIAGNOSIS — E1165 Type 2 diabetes mellitus with hyperglycemia: Secondary | ICD-10-CM

## 2023-12-14 DIAGNOSIS — I6529 Occlusion and stenosis of unspecified carotid artery: Secondary | ICD-10-CM | POA: Diagnosis not present

## 2023-12-14 DIAGNOSIS — E785 Hyperlipidemia, unspecified: Secondary | ICD-10-CM

## 2023-12-15 ENCOUNTER — Other Ambulatory Visit: Payer: Self-pay | Admitting: Family Medicine

## 2023-12-15 ENCOUNTER — Ambulatory Visit: Payer: PPO | Admitting: Family Medicine

## 2023-12-15 ENCOUNTER — Encounter: Payer: Self-pay | Admitting: Family Medicine

## 2023-12-15 VITALS — BP 120/80 | HR 82 | Temp 98.0°F | Resp 18 | Ht 61.0 in | Wt 168.0 lb

## 2023-12-15 DIAGNOSIS — E1165 Type 2 diabetes mellitus with hyperglycemia: Secondary | ICD-10-CM

## 2023-12-15 DIAGNOSIS — I639 Cerebral infarction, unspecified: Secondary | ICD-10-CM | POA: Diagnosis not present

## 2023-12-15 DIAGNOSIS — I6529 Occlusion and stenosis of unspecified carotid artery: Secondary | ICD-10-CM

## 2023-12-15 DIAGNOSIS — Z7984 Long term (current) use of oral hypoglycemic drugs: Secondary | ICD-10-CM | POA: Diagnosis not present

## 2023-12-15 DIAGNOSIS — E118 Type 2 diabetes mellitus with unspecified complications: Secondary | ICD-10-CM

## 2023-12-15 LAB — POCT GLYCOSYLATED HEMOGLOBIN (HGB A1C): Hemoglobin A1C: 6.9 % — AB (ref 4.0–5.6)

## 2023-12-15 MED ORDER — METFORMIN HCL ER 500 MG PO TB24: 1500.0000 mg | ORAL_TABLET | Freq: Two times a day (BID) | ORAL | 7 refills | Status: DC

## 2023-12-15 MED ORDER — CLOPIDOGREL BISULFATE 75 MG PO TABS: 75.0000 mg | ORAL_TABLET | Freq: Every day | ORAL | 3 refills | Status: DC

## 2023-12-15 NOTE — Progress Notes (Signed)
SUBJECTIVE:   Chief Complaint  Patient presents with   Medical Management of Chronic Issues    6 month follow up   HPI Presents for follow up chronic disease management   Discussed the use of AI scribe software for clinical note transcription with the patient, who gave verbal consent to proceed.  History of Present Illness The patient, a 73 year old with a history of diabetes, presents for a routine follow-up. They have been monitoring their blood glucose levels at home, reporting morning readings between 115 and 120, and evening readings occasionally reaching 130 to 140. The highest recorded level was 144, and the lowest was 78. They are currently on Jardiance 25 and Metformin 500mg  twice daily, which they tolerate well. Their most recent HbA1c was 6.9, down from 7.2 at the previous check.  The patient also reports a history of hypertension and is on a regimen of Plavix 75mg . They recently refilled their prescription and have not yet opened the new bottle. They deny any symptoms of chest pain or shortness of breath.  Regarding their colon health, the patient had a colonoscopy a long time ago and has not had any colon cancer screening since. They express uncertainty about undergoing further screening at this time.  The patient's last eye exam was in May of the previous year, and they are due for another in the coming May. They have not received a flu shot this year and have not had a pneumonia vaccine in the past two to three years. They are considering whether to receive the newer pneumonia vaccine that covers 20 strains.    PERTINENT PMH / PSH: As above  OBJECTIVE:  BP 120/80   Pulse 82   Temp 98 F (36.7 C) (Oral)   Resp 18   Ht 5\' 1"  (1.549 m)   Wt 168 lb (76.2 kg)   SpO2 98%   BMI 31.74 kg/m    Physical Exam Vitals reviewed.  Constitutional:      General: She is not in acute distress.    Appearance: Normal appearance. She is obese. She is not ill-appearing,  toxic-appearing or diaphoretic.  Eyes:     General:        Right eye: No discharge.        Left eye: No discharge.     Conjunctiva/sclera: Conjunctivae normal.  Neck:     Thyroid: No thyromegaly or thyroid tenderness.  Cardiovascular:     Rate and Rhythm: Normal rate and regular rhythm.     Heart sounds: Normal heart sounds.  Pulmonary:     Effort: Pulmonary effort is normal.     Breath sounds: Normal breath sounds.  Abdominal:     General: Bowel sounds are normal.  Musculoskeletal:        General: Normal range of motion.  Skin:    General: Skin is warm and dry.  Neurological:     General: No focal deficit present.     Mental Status: She is alert and oriented to person, place, and time. Mental status is at baseline.  Psychiatric:        Mood and Affect: Mood normal.        Behavior: Behavior normal.        Thought Content: Thought content normal.        Judgment: Judgment normal.           12/15/2023    8:20 AM 05/26/2023    9:39 AM 02/24/2023    8:18 AM 12/18/2022  8:38 AM 09/04/2022    2:40 PM  Depression screen PHQ 2/9  Decreased Interest 0 0 0 0 0  Down, Depressed, Hopeless 0 0 0 0 0  PHQ - 2 Score 0 0 0 0 0  Altered sleeping 0 0     Tired, decreased energy 0 0     Change in appetite 0 0     Feeling bad or failure about yourself  0 0     Trouble concentrating 0 0     Moving slowly or fidgety/restless 0 0     Suicidal thoughts 0 0     PHQ-9 Score 0 0     Difficult doing work/chores Not difficult at all Not difficult at all         12/15/2023    8:20 AM 05/26/2023    9:39 AM 02/24/2023    8:18 AM 12/18/2022    8:38 AM  GAD 7 : Generalized Anxiety Score  Nervous, Anxious, on Edge 0 0 0 0  Control/stop worrying 0 0 0 0  Worry too much - different things 0 0 0 0  Trouble relaxing 0 0 0 0  Restless 0 0 0 0  Easily annoyed or irritable 0 0 0 0  Afraid - awful might happen 0 0 0 0  Total GAD 7 Score 0 0 0 0  Anxiety Difficulty Not difficult at all Not difficult  at all Not difficult at all Not difficult at all    ASSESSMENT/PLAN:  Type 2 diabetes mellitus with complications (HCC) Assessment & Plan: Home glucose readings between 115-144 mg/dL. A1c improved from 7.2 to 6.9. Discussed the benefits of further lowering glucose levels with an additional dose of Metformin. -Increase Metformin to 1000mg  in the morning and 500mg  in the evening. -Recheck A1c in 6 months.  Orders: -     POCT glycosylated hemoglobin (Hb A1C)  Cerebrovascular accident (CVA), unspecified mechanism (HCC) -     Clopidogrel Bisulfate; Take 1 tablet (75 mg total) by mouth daily.  Dispense: 90 tablet; Refill: 3  Asymptomatic carotid artery stenosis, unspecified laterality -     Clopidogrel Bisulfate; Take 1 tablet (75 mg total) by mouth daily.  Dispense: 90 tablet; Refill: 3    General Health Maintenance -Discussed colon cancer screening options. Patient will consider and inform at next visit. -Recommended tetanus booster, flu shot, and pneumonia vaccine. Patient will consider and inform at next visit. -Continue annual eye exams and foot exams. Next due in May 2025.    PDMP reviewed  Return in about 6 months (around 06/13/2024) for PCP, DM, Foot exam.  Dana Allan, MD

## 2023-12-15 NOTE — Patient Instructions (Addendum)
It was a pleasure meeting you today. Thank you for allowing me to take part in your health care.  Our goals for today as we discussed include:  A1c today 6.9.  Has decreased from 7.2 previously Increase Metformin.  Take 2 tablets with breakfast and 1 tablet in the evening   Refills sent for Plavix and Metformin  Annual Eye exam in May  Foot exam at next visit  Consider Cologuard for colon cancer screening  This is a list of the screening recommended for you and due dates:  Health Maintenance  Topic Date Due   DTaP/Tdap/Td vaccine (1 - Tdap) Never done   Colon Cancer Screening  Never done   Flu Shot  Never done   COVID-19 Vaccine (5 - 2024-25 season) 07/26/2023   Medicare Annual Wellness Visit  09/05/2023   Complete foot exam   02/24/2024   Eye exam for diabetics  04/22/2024   Yearly kidney health urinalysis for diabetes  05/25/2024   Hemoglobin A1C  06/13/2024   Yearly kidney function blood test for diabetes  07/06/2024   Mammogram  11/08/2024   DEXA scan (bone density measurement)  Completed   Hepatitis C Screening  Completed   HPV Vaccine  Aged Out   Pneumonia Vaccine  Discontinued   Zoster (Shingles) Vaccine  Discontinued    Follow up in 6 months  If you have any questions or concerns, please do not hesitate to call the office at [phone].  I look forward to our next visit and until then take care and stay safe.  Regards,   Dana Allan, MD   [facility]

## 2023-12-16 ENCOUNTER — Other Ambulatory Visit: Payer: Self-pay | Admitting: Family Medicine

## 2023-12-16 DIAGNOSIS — E1165 Type 2 diabetes mellitus with hyperglycemia: Secondary | ICD-10-CM

## 2023-12-26 ENCOUNTER — Encounter: Payer: Self-pay | Admitting: Family Medicine

## 2023-12-26 NOTE — Assessment & Plan Note (Signed)
Home glucose readings between 115-144 mg/dL. A1c improved from 7.2 to 6.9. Discussed the benefits of further lowering glucose levels with an additional dose of Metformin. -Increase Metformin to 1000mg  in the morning and 500mg  in the evening. -Recheck A1c in 6 months.

## 2024-01-04 ENCOUNTER — Other Ambulatory Visit: Payer: Self-pay | Admitting: Family Medicine

## 2024-01-04 DIAGNOSIS — E1165 Type 2 diabetes mellitus with hyperglycemia: Secondary | ICD-10-CM

## 2024-01-06 ENCOUNTER — Encounter: Payer: Self-pay | Admitting: Oncology

## 2024-01-06 ENCOUNTER — Inpatient Hospital Stay (HOSPITAL_BASED_OUTPATIENT_CLINIC_OR_DEPARTMENT_OTHER): Payer: PPO | Admitting: Oncology

## 2024-01-06 ENCOUNTER — Inpatient Hospital Stay: Payer: PPO | Attending: Radiation Oncology

## 2024-01-06 VITALS — BP 158/70 | HR 79 | Temp 98.5°F | Resp 18 | Wt 169.0 lb

## 2024-01-06 DIAGNOSIS — Z95828 Presence of other vascular implants and grafts: Secondary | ICD-10-CM | POA: Diagnosis not present

## 2024-01-06 DIAGNOSIS — C50812 Malignant neoplasm of overlapping sites of left female breast: Secondary | ICD-10-CM | POA: Insufficient documentation

## 2024-01-06 DIAGNOSIS — I2510 Atherosclerotic heart disease of native coronary artery without angina pectoris: Secondary | ICD-10-CM | POA: Insufficient documentation

## 2024-01-06 DIAGNOSIS — Z79811 Long term (current) use of aromatase inhibitors: Secondary | ICD-10-CM | POA: Insufficient documentation

## 2024-01-06 DIAGNOSIS — Z452 Encounter for adjustment and management of vascular access device: Secondary | ICD-10-CM | POA: Diagnosis present

## 2024-01-06 DIAGNOSIS — C50919 Malignant neoplasm of unspecified site of unspecified female breast: Secondary | ICD-10-CM

## 2024-01-06 DIAGNOSIS — Z7982 Long term (current) use of aspirin: Secondary | ICD-10-CM | POA: Insufficient documentation

## 2024-01-06 DIAGNOSIS — Z79899 Other long term (current) drug therapy: Secondary | ICD-10-CM | POA: Diagnosis not present

## 2024-01-06 DIAGNOSIS — Z7902 Long term (current) use of antithrombotics/antiplatelets: Secondary | ICD-10-CM | POA: Insufficient documentation

## 2024-01-06 DIAGNOSIS — E119 Type 2 diabetes mellitus without complications: Secondary | ICD-10-CM | POA: Diagnosis not present

## 2024-01-06 DIAGNOSIS — Z803 Family history of malignant neoplasm of breast: Secondary | ICD-10-CM | POA: Diagnosis not present

## 2024-01-06 DIAGNOSIS — Z8673 Personal history of transient ischemic attack (TIA), and cerebral infarction without residual deficits: Secondary | ICD-10-CM | POA: Diagnosis not present

## 2024-01-06 DIAGNOSIS — E876 Hypokalemia: Secondary | ICD-10-CM | POA: Diagnosis not present

## 2024-01-06 DIAGNOSIS — Z923 Personal history of irradiation: Secondary | ICD-10-CM | POA: Insufficient documentation

## 2024-01-06 DIAGNOSIS — I10 Essential (primary) hypertension: Secondary | ICD-10-CM | POA: Insufficient documentation

## 2024-01-06 DIAGNOSIS — Z9012 Acquired absence of left breast and nipple: Secondary | ICD-10-CM | POA: Insufficient documentation

## 2024-01-06 DIAGNOSIS — G8929 Other chronic pain: Secondary | ICD-10-CM | POA: Insufficient documentation

## 2024-01-06 DIAGNOSIS — R232 Flushing: Secondary | ICD-10-CM | POA: Diagnosis not present

## 2024-01-06 DIAGNOSIS — Z87891 Personal history of nicotine dependence: Secondary | ICD-10-CM | POA: Diagnosis not present

## 2024-01-06 DIAGNOSIS — Z7984 Long term (current) use of oral hypoglycemic drugs: Secondary | ICD-10-CM | POA: Diagnosis not present

## 2024-01-06 DIAGNOSIS — Z170 Estrogen receptor positive status [ER+]: Secondary | ICD-10-CM | POA: Insufficient documentation

## 2024-01-06 LAB — CBC WITH DIFFERENTIAL (CANCER CENTER ONLY)
Abs Immature Granulocytes: 0.01 10*3/uL (ref 0.00–0.07)
Basophils Absolute: 0 10*3/uL (ref 0.0–0.1)
Basophils Relative: 1 %
Eosinophils Absolute: 0.2 10*3/uL (ref 0.0–0.5)
Eosinophils Relative: 4 %
HCT: 40.5 % (ref 36.0–46.0)
Hemoglobin: 13.5 g/dL (ref 12.0–15.0)
Immature Granulocytes: 0 %
Lymphocytes Relative: 28 %
Lymphs Abs: 1.4 10*3/uL (ref 0.7–4.0)
MCH: 31.6 pg (ref 26.0–34.0)
MCHC: 33.3 g/dL (ref 30.0–36.0)
MCV: 94.8 fL (ref 80.0–100.0)
Monocytes Absolute: 0.5 10*3/uL (ref 0.1–1.0)
Monocytes Relative: 10 %
Neutro Abs: 2.9 10*3/uL (ref 1.7–7.7)
Neutrophils Relative %: 57 %
Platelet Count: 186 10*3/uL (ref 150–400)
RBC: 4.27 MIL/uL (ref 3.87–5.11)
RDW: 13.8 % (ref 11.5–15.5)
WBC Count: 5 10*3/uL (ref 4.0–10.5)
nRBC: 0 % (ref 0.0–0.2)

## 2024-01-06 LAB — CMP (CANCER CENTER ONLY)
ALT: 44 U/L (ref 0–44)
AST: 29 U/L (ref 15–41)
Albumin: 4.2 g/dL (ref 3.5–5.0)
Alkaline Phosphatase: 50 U/L (ref 38–126)
Anion gap: 10 (ref 5–15)
BUN: 16 mg/dL (ref 8–23)
CO2: 24 mmol/L (ref 22–32)
Calcium: 9 mg/dL (ref 8.9–10.3)
Chloride: 103 mmol/L (ref 98–111)
Creatinine: 0.76 mg/dL (ref 0.44–1.00)
GFR, Estimated: >60 mL/min (ref >60)
Glucose, Bld: 150 mg/dL — ABNORMAL HIGH (ref 70–99)
Potassium: 3.3 mmol/L — ABNORMAL LOW (ref 3.5–5.1)
Sodium: 137 mmol/L (ref 135–145)
Total Bilirubin: 0.7 mg/dL (ref 0.0–1.2)
Total Protein: 6.6 g/dL (ref 6.5–8.1)

## 2024-01-06 MED ORDER — POTASSIUM CHLORIDE CRYS ER 20 MEQ PO TBCR: 20.0000 meq | EXTENDED_RELEASE_TABLET | Freq: Every day | ORAL | 0 refills | Status: DC

## 2024-01-06 MED ORDER — SODIUM CHLORIDE 0.9% FLUSH
10.0000 mL | Freq: Once | INTRAVENOUS | Status: AC
  Administered 2024-01-06: 10 mL via INTRAVENOUS
  Filled 2024-01-06: qty 10

## 2024-01-06 MED ORDER — HEPARIN SOD (PORK) LOCK FLUSH 100 UNIT/ML IV SOLN
500.0000 [IU] | Freq: Once | INTRAVENOUS | Status: AC
  Administered 2024-01-06: 500 [IU] via INTRAVENOUS
  Filled 2024-01-06: qty 5

## 2024-01-06 MED ORDER — ANASTROZOLE 1 MG PO TABS
1.0000 mg | ORAL_TABLET | Freq: Every day | ORAL | 1 refills | Status: DC
Start: 2024-01-06 — End: 2024-07-06

## 2024-01-06 NOTE — Assessment & Plan Note (Signed)
Recommend calcium 1200 mg daily with Vitamin D supplementation Baseline DEXA 2022 - normal- repeat in June 2024 -Normal  Patient declined  adjuvant bisphosphonate treatment

## 2024-01-06 NOTE — Assessment & Plan Note (Signed)
recommend port flush every 6-8 weeks Discussed about  port removal  and she prefers to keep medi port

## 2024-01-06 NOTE — Assessment & Plan Note (Signed)
Recommend potassium daily x 3 days. Rx sent

## 2024-01-06 NOTE — Progress Notes (Addendum)
Hematology/Oncology Progress note [phone] [phone]      Patient Care Team: Dana Allan, MD as PCP - General (Family Medicine) Scarlett Presto, RN (Inactive) as Oncology Nurse Navigator Carolan Shiver, MD as Consulting Physician (General Surgery) Rickard Patience, MD as Consulting Physician (Oncology) Carmina Miller, MD as Consulting Physician (Radiation Oncology)  ASSESSMENT & PLAN:   Cancer Staging  Invasive carcinoma of breast [facility]) Staging form: Breast, AJCC 8th Edition - Clinical stage from 05/16/2021: Stage IIB (cT3, cN0, cM0, G3, ER+, PR+, HER2-) - Signed by Rickard Patience, MD on 06/03/2021   Invasive carcinoma of breast (HCC) Left breast invasive carcinoma, multiple sites.  ER 90%, PR 11-50%, HER2-cT3 N0, Ki-67 was 20%.  neoadjuvant chemotherapy ddAC x 4 followed by weekly Taxol--followed left mastectomy ypT2 pN0- adjuvant RT due to pectoralis muscle abut - lymph node negative disease.no need for adjuvant abemaciclib Currently on endocrine therapy with Arimidex.  She tolerates well. Continue Arimidex 1mg  daily, refills were sent.  annual screening mammogram of right breast. Dec 2024- results were reviewed.    Aromatase inhibitor use Recommend calcium 1200 mg daily with Vitamin D supplementation Baseline DEXA 2022 - normal- repeat in June 2024 -Normal  Patient declined  adjuvant bisphosphonate treatment  Port-A-Cath in place recommend port flush every 6-8 weeks Discussed about  port removal  and she prefers to keep medi port   Hypokalemia Recommend potassium daily x 3 days. Rx sent  Orders Placed This Encounter  Procedures   Cancer antigen 27.29    Standing Status:   Future    Expected Date:   07/05/2024    Expiration Date:   01/05/2025   Cancer antigen 15-3    Standing Status:   Future    Expected Date:   07/05/2024    Expiration Date:   01/05/2025   CBC with Differential (Cancer Center Only)    Standing Status:   Future    Expected  Date:   07/05/2024    Expiration Date:   01/05/2025   CMP (Cancer Center only)    Standing Status:   Future    Expected Date:   07/05/2024    Expiration Date:   01/05/2025   Lab MD 6 months flex, cbc cmp All questions were answered. The patient knows to call the clinic with any problems, questions or concerns.  Rickard Patience, MD, PhD [facility] Hematology Oncology 01/06/2024   CHIEF COMPLAINTS/REASON FOR VISIT:  multifocal left breast cancer  HISTORY OF PRESENTING ILLNESS:   Tammy Nunez is a  73 y.o.  female presents for follow up of multifocal left breast cancer.  Oncology summary listed below.  Oncology History  Breast cancer in female [facility]) (Resolved)  05/08/2021 Initial Diagnosis   Breast cancer in female [facility])   06/03/2021 - 06/03/2021 Chemotherapy         06/21/2021 - 01/21/2022 Chemotherapy   Patient is on Treatment Plan : BREAST ADJUVANT DOSE DENSE AC q14d / PACLitaxel q7d     Invasive carcinoma of breast (HCC)  05/02/2021 Mammogram   left breast diagnostic mammogram and ultrasound showed multiple irregular mass in the left breast. Mammogram mass 1 irregular mass associated with distortion in the upper breast at middle depth, multiple adjacent and possible continuous irregular masses, conglomeration of masses measures at least 5.6 cm.  Associated pleomorphic calcifications extending at least 5 x 3.9 x 3.7 cm Mass 2, 1 cm mass in the upper breast at the middle to posterior depth. Mass 3, 5 mm irregular  mass in the upper breast at posterior depth. In total, mass 1-mass 3 span at least 8.2 cm in anterior-posterior extent.   By ultrasound  mass 1 12:00 5 cm from nipple, 2.7 x 1.5 x 4.3 cm Mass 2, 12:00 12 cm from nipple, 0.9 x 0.9 x 0.5 cm Mass 3   12:00 14 cm from nipple, 0.4 x 0.4 x 0.4 cm-uses 2.4 cm superior to mass to Mass 4   11:00 No suspicious left axillary adenopathy.   05/07/2021 Initial Diagnosis   Invasive carcinoma of breast  - Ultrasound-guided breast  biopsy Breast left 12:00 mass 5 cm from nipple biopsy showed invasive mammary carcinoma, no special type, grade 3, high-grade DCIS present, no LVI, ER 90%, PR 11-50%, HER2 negative by IHC Breast left 11:00 mass 10 cm from nipple biopsy showed invasive mammary carcinoma, no special type, grade 2, no DCIS/LVI identified.ER 90%, PR 11-50%, HER2 negative by [facility]     05/16/2021 Cancer Staging   Staging form: Breast, AJCC 8th Edition - Clinical stage from 05/16/2021: Stage IIB (cT3, cN0, cM0, G3, ER+, PR+, HER2-) - Signed by Rickard Patience, MD on 06/03/2021 Stage prefix: Initial diagnosis Histologic grading system: 3 grade system   05/22/2021 Imaging   MRI breast showed that nonmass like malignancy spanning over 8 cm, abuting anterior aspect of pectoralis muscle. Discussed with Dr.Cintron and we agree that she will need mastectomy. There is concern of possible positive margin. I recommend neoadjuvant chemotherapy   06/06/2021 Miscellaneous   Medi port was placed by Dr.Cintron   06/13/2021 Echocardiogram   pre chemotherapy Echo. LVEF 60-65%.  Normal global longitudinal strain. Grade 1 diastolic dysfunction   06/21/2021 - 10/25/2021 Chemotherapy   ddAC x 4 with GCSF followed by weekly Taxol x 12 Chemotherapy break due to side effects, UTI and the patient's preference.   02/17/2022 Surgery   Patient underwent left simple mastectomy, and a sentinel lymph node biopsy.  Pathology showed invasive mammary carcinoma, high-grade DCIS, 4 lymph nodes were sampled and were negative for cancer. ypT2 pN0, grade 3, multifocal invasive carcinoma.  All margins are negative for invasive carcinoma and DCIS. Ki-67 was 20%.   04/29/2022 - 06/03/2022 Radiation Therapy   Due to the initial mass potentially abutting the pectoralis muscle, she received adjuvant chest wall radiation   06/13/2022 -  Anti-estrogen oral therapy   Start on Arimidex 1mg  daily     INTERVAL HISTORY Tammy Nunez is a 73 y.o. female who has above  history reviewed by me today presents for follow up visit for  multifocal left breast cancer Patient reports reports feeling well.  Gained weight.  Patient tolerates Arimidex well. Manageable hot flash No new complains.   Review of Systems  Constitutional:  Negative for appetite change, chills, fatigue and fever.  HENT:   Negative for hearing loss and voice change.   Eyes:  Negative for eye problems.  Respiratory:  Negative for chest tightness and cough.   Cardiovascular:  Negative for chest pain.  Gastrointestinal:  Negative for abdominal distention, abdominal pain, blood in stool, diarrhea and nausea.  Endocrine: Negative for hot flashes.  Genitourinary:  Negative for difficulty urinating, dysuria and frequency.   Musculoskeletal:  Negative for arthralgias.  Skin:  Negative for itching and rash.  Neurological:  Negative for extremity weakness.  Hematological:  Negative for adenopathy. Does not bruise/bleed easily.  Psychiatric/Behavioral:  Negative for confusion.     MEDICAL HISTORY:  Past Medical History:  Diagnosis Date   AKI (acute kidney  injury) (HCC)    Anemia    Annual physical exam 10/09/2021   Breast cancer (HCC) 02/17/2022   Breast cancer (HCC) 02/17/2022   Breast cancer in female [facility]) 05/08/2021   Breast cancer in female [facility]) 05/08/2021   Surgery appt sch 05/09/21 Dr. Awanda [facility] clinic         DIAGNOSIS:  A. BREAST, LEFT, 12 O'CLOCK, 5 CM FROM NIPPLE; ULTRASOUND-GUIDED CORE  BIOPSY:  - INVASIVE MAMMARY CARCINOMA, NO SPECIAL TYPE.  Size of invasive carcinoma: 2.5 mm in this sample  Histologic grade of invasive carcinoma: Grade 3                       Glandular/tubular differentiation score: 3                       Nu   Carotid stenosis, symptomatic, with infarction (HCC) 07/20/2019   Chemotherapy induced nausea and vomiting 06/28/2021   Chronic left shoulder pain    Coronary artery disease    x 1 stent   Diabetes mellitus without complication (HCC)     Dysrhythmia    Essential hypertension    Hypertension    Hypomagnesemia 09/13/2021   Invasive carcinoma of breast (HCC) 05/08/2021   New onset type 2 diabetes mellitus (HCC)    Numbness 06/03/2021   Obesity, diabetes, and hypertension syndrome (HCC) 10/11/2020   Paralysis (HCC)    weakness left upper amd lower extremity   PONV (postoperative nausea and vomiting)    Seizures (HCC)    Stroke [facility])     SURGICAL HISTORY: Past Surgical History:  Procedure Laterality Date   BREAST BIOPSY Left 05/07/2021   12:00 5cmfn vision - INVASIVE MAMMARY CARCINOMA,   BREAST BIOPSY Left 05/07/2021   11:00 10 cmfn mini cork clip INVASIVE MAMMARY CARCINOMA,   CAROTID PTA/STENT INTERVENTION Left 07/20/2019   Procedure: CAROTID PTA/STENT INTERVENTION;  Surgeon: Renford Dills, MD;  Location: [facility] INVASIVE CV LAB;  Service: Cardiovascular;  Laterality: Left;   ECTOPIC PREGNANCY SURGERY     PORTACATH PLACEMENT N/A 06/06/2021   Procedure: INSERTION PORT-A-CATH;  Surgeon: Carolan Shiver, MD;  Location: [facility] ORS;  Service: General;  Laterality: N/A;   SENTINEL NODE BIOPSY Left 02/17/2022   Procedure: SENTINEL NODE BIOPSY;  Surgeon: Carolan Shiver, MD;  Location: [facility] ORS;  Service: General;  Laterality: Left;   TOTAL MASTECTOMY Left 02/17/2022   Procedure: TOTAL MASTECTOMY;  Surgeon: Carolan Shiver, MD;  Location: [facility] ORS;  Service: General;  Laterality: Left;    SOCIAL HISTORY: Social History   Socioeconomic History   Marital status: Married    Spouse name: Ivar Drape   Number of children: Not on file   Years of education: Not on file   Highest education level: Not on file  Occupational History   Not on file  Tobacco Use   Smoking status: Former    Current packs/day: 0.00    Average packs/day: 0.5 packs/day for 15.0 years (7.5 ttl pk-yrs)    Types: Cigarettes    Start date: 04/10/2004    Quit date: 04/11/2019    Years since quitting: 4.7   Smokeless tobacco: Never   Vaping Use   Vaping status: Never Used  Substance and Sexual Activity   Alcohol use: Never   Drug use: Never   Sexual activity: Not Currently  Other Topics Concern   Not on file  Social History Narrative   ** Merged History Encounter **  No kids  Married with husband  Used to work Nurse, adult     Social Drivers of Corporate investment banker Strain: Low Risk  (09/04/2022)   Overall Financial Resource Strain (CARDIA)    Difficulty of Paying Living Expenses: Not hard at all  Food Insecurity: No Food Insecurity (09/04/2022)   Hunger Vital Sign    Worried About Running Out of Food in the Last Year: Never true    Ran Out of Food in the Last Year: Never true  Transportation Needs: No Transportation Needs (09/04/2022)   PRAPARE - Administrator, Civil Service (Medical): No    Lack of Transportation (Non-Medical): No  Physical Activity: Sufficiently Active (09/04/2022)   Exercise Vital Sign    Days of Exercise per Week: 7 days    Minutes of Exercise per Session: 30 min  Stress: No Stress Concern Present (09/04/2022)   [employer] of Occupational Health - Occupational Stress Questionnaire    Feeling of Stress : Not at all  Social Connections: Socially Integrated (09/04/2022)   Social Connection and Isolation Panel [NHANES]    Frequency of Communication with Friends and Family: Three times a week    Frequency of Social Gatherings with Friends and Family: Once a week    Attends Religious Services: 1 to 4 times per year    Active Member of [employer] or Organizations: Yes    Attends Banker Meetings: 1 to 4 times per year    Marital Status: Married  Catering manager Violence: Not At Risk (09/04/2022)   Humiliation, Afraid, Rape, and Kick questionnaire    Fear of Current or Ex-Partner: No    Emotionally Abused: No    Physically Abused: No    Sexually Abused: No    FAMILY HISTORY: Family History  Problem Relation Age of  Onset   Diabetes type II Mother    Hypertension Father    Heart attack Father    Diabetes type II Brother    Breast cancer Neg Hx     ALLERGIES:  has no known allergies.  MEDICATIONS:  Current Outpatient Medications  Medication Sig Dispense Refill   amLODipine (NORVASC) 5 MG tablet Take 1 tablet (5 mg total) by mouth daily. 90 tablet 3   aspirin EC 81 MG tablet Take 1 tablet (81 mg total) by mouth daily. 90 tablet 3   atorvastatin (LIPITOR) 80 MG tablet Take 1 tablet (80 mg total) by mouth daily. 90 tablet 3   blood glucose meter kit and supplies 1 each by Other route 4 (four) times daily. Dispense based on patient and insurance preference. Four times daily as directed. (FOR ICD-10 E10.9, E11.9). 1 each 0   calcium carbonate (OS-CAL) 600 MG tablet Take 2 tablets (1,200 mg total) by mouth daily. 180 tablet 3   carvedilol (COREG) 25 MG tablet Take 1 tablet (25 mg total) by mouth 2 (two) times daily with a meal. 180 tablet 3   clopidogrel (PLAVIX) 75 MG tablet Take 1 tablet (75 mg total) by mouth daily. 90 tablet 3   Continuous Glucose Sensor (FREESTYLE LIBRE 2 SENSOR) MISC USE TO CHECK BLOOD SUGAR AT LEAST EVERY 8 HOURS 6 each 5   empagliflozin (JARDIANCE) 25 MG TABS tablet Take 1 tablet (25 mg total) by mouth daily. 90 tablet 3   ezetimibe (ZETIA) 10 MG tablet Take 1 tablet (10 mg total) by mouth daily. 90 tablet 3   lidocaine-prilocaine (EMLA) cream APPLY  CREAM TOPICALLY TO AFFECTED  AREA AS NEEDED PRIOR TO PORT ACCESS. 30 g 1   lisinopril (ZESTRIL) 40 MG tablet Take 1 tablet (40 mg total) by mouth daily. 90 tablet 3   metFORMIN (GLUCOPHAGE-XR) 500 MG 24 hr tablet Take 1 tablet by mouth once daily with breakfast 90 tablet 1   Multiple Vitamin (MULTIVITAMIN) tablet Take 1 tablet by mouth daily.     potassium chloride SA (KLOR-CON M) 20 MEQ tablet Take 1 tablet (20 mEq total) by mouth daily. 3 tablet 0   vitamin B-12 (CYANOCOBALAMIN) 1000 MCG tablet Take 1 tablet (1,000 mcg total) by mouth  daily. 30 tablet 1   Vitamin D, Cholecalciferol, 25 MCG (1000 UT) TABS Take 1,000 Units by mouth.     anastrozole (ARIMIDEX) 1 MG tablet Take 1 tablet (1 mg total) by mouth daily. 90 tablet 1   No current facility-administered medications for this visit.   Facility-Administered Medications Ordered in Other Visits  Medication Dose Route Frequency Provider Last Rate Last Admin   heparin lock flush 100 UNIT/ML injection              PHYSICAL EXAMINATION: ECOG PERFORMANCE STATUS: 2 - Symptomatic, <50% confined to bed Vitals:   01/06/24 1036  BP: (!) 158/70  Pulse: 79  Resp: 18  Temp: 98.5 F (36.9 C)  SpO2: 99%    Filed Weights   01/06/24 1036  Weight: 169 lb (76.7 kg)      Physical Exam Constitutional:      General: She is not in acute distress. HENT:     Head: Normocephalic and atraumatic.  Eyes:     General: No scleral icterus. Cardiovascular:     Rate and Rhythm: Normal rate and regular rhythm.     Heart sounds: Normal heart sounds.  Pulmonary:     Effort: Pulmonary effort is normal. No respiratory distress.     Breath sounds: No wheezing.  Abdominal:     General: Bowel sounds are normal. There is no distension.     Palpations: Abdomen is soft.  Musculoskeletal:        General: No deformity. Normal range of motion.     Cervical back: Normal range of motion and neck supple.  Skin:    General: Skin is warm and dry.     Findings: No erythema or rash.  Neurological:     Mental Status: She is alert and oriented to person, place, and time. Mental status is at baseline.     Comments: Chronic left upper and lower extremity weakness.  Psychiatric:        Mood and Affect: Mood normal.   Left breast s/p mastectomy, no palpable chest wall mass    LABORATORY DATA:  I have reviewed the data as listed Lab Results  Component Value Date   WBC 5.0 01/06/2024   HGB 13.5 01/06/2024   HCT 40.5 01/06/2024   MCV 94.8 01/06/2024   PLT 186 01/06/2024   Recent Labs     02/11/23 0837 07/07/23 1002 01/06/24 1020  NA 138 139 137  K 4.5 3.7 3.3*  CL 101 106 103  CO2 26 22 24   GLUCOSE 163* 208* 150*  BUN 16 13 16   CREATININE 1.04 0.98 0.76  CALCIUM 10.4 9.3 9.0  GFRNONAA  --  >60 >60  PROT 6.9 7.2 6.6  ALBUMIN 4.4 4.4 4.2  AST 21 25 29   ALT 23 33 44  ALKPHOS 80 64 50  BILITOT 0.6 0.7 0.7   Iron/TIBC/Ferritin/ %Sat    Component  Value Date/Time   IRON 113 12/08/2019 1015   TIBC 317 12/08/2019 1015   FERRITIN 67 12/08/2019 1015   IRONPCTSAT 36 12/08/2019 1015       RADIOGRAPHIC STUDIES: I have personally reviewed the radiological images as listed and agreed with the findings in the report. VAS US CAROTID Result Date: 12/14/2023 Carotid Arterial Duplex Study Patient Name:  Tammy Nunez Slinger  Date of Exam:   12/14/2023 Medical Rec #: [id number]          Accession #:    [id number] Date of Birth: Jun 30, 1951          Patient Gender: F Patient Age:   75 years Exam Location:  [facility] Procedure:      VAS US CAROTID Referring Phys: Sheppard Plumber --------------------------------------------------------------------------------  Indications:   Carotid artery disease. Other Factors: 06/2019 Placement of a 8 x 6 x 30 exact stent with the use of the                NAV-6 embolic protection device in the right internal carotid                artery. Performing Technologist: Salvadore Farber RVT  Examination Guidelines: A complete evaluation includes B-mode imaging, spectral Doppler, color Doppler, and power Doppler as needed of all accessible portions of each vessel. Bilateral testing is considered an integral part of a complete examination. Limited examinations for reoccurring indications may be performed as noted.  Right Carotid Findings: +----------+--------+--------+--------+------------------+--------+           PSV cm/sEDV cm/sStenosisPlaque DescriptionComments +----------+--------+--------+--------+------------------+--------+ CCA Prox  92      16                                          +----------+--------+--------+--------+------------------+--------+ CCA Mid   68      15                                         +----------+--------+--------+--------+------------------+--------+ CCA Distal63      17                                stent    +----------+--------+--------+--------+------------------+--------+ ICA Prox  57      14      1-39%                     stent    +----------+--------+--------+--------+------------------+--------+ ICA Mid   67      16                                         +----------+--------+--------+--------+------------------+--------+ ICA Distal64      15                                         +----------+--------+--------+--------+------------------+--------+ ECA       94      11                                         +----------+--------+--------+--------+------------------+--------+ +----------+--------+-------+----------------+-------------------+  PSV cm/sEDV cmsDescribe        Arm Pressure (mmHG) +----------+--------+-------+----------------+-------------------+ [id number]            Multiphasic, WNL                    +----------+--------+-------+----------------+-------------------+ +---------+--------+--+--------+--+---------+ VertebralPSV cm/s62EDV cm/s13Antegrade +---------+--------+--+--------+--+---------+  Left Carotid Findings: +----------+--------+--------+--------+-------------------+--------+           PSV cm/sEDV cm/sStenosisPlaque Description Comments +----------+--------+--------+--------+-------------------+--------+ CCA Prox  90      16                                          +----------+--------+--------+--------+-------------------+--------+ CCA Mid   76      15                                          +----------+--------+--------+--------+-------------------+--------+ CCA Distal74      19                                           +----------+--------+--------+--------+-------------------+--------+ ICA Prox  77      23                                          +----------+--------+--------+--------+-------------------+--------+ ICA Mid   79      23                                          +----------+--------+--------+--------+-------------------+--------+ ICA Distal94      27      1-39%   calcific and smooth         +----------+--------+--------+--------+-------------------+--------+ ECA       225     20      >50%                                +----------+--------+--------+--------+-------------------+--------+ +----------+--------+--------+----------------+-------------------+           PSV cm/sEDV cm/sDescribe        Arm Pressure (mmHG) +----------+--------+--------+----------------+-------------------+ [id number]             Multiphasic, WNL                    +----------+--------+--------+----------------+-------------------+ +---------+--------+--+--------+--+---------+ VertebralPSV cm/s44EDV cm/s12Antegrade +---------+--------+--+--------+--+---------+   Summary: Right Carotid: Velocities in the right ICA are consistent with a 1-39% stenosis.                Non-hemodynamically significant plaque <50% noted in the CCA. The                ECA appears <50% stenosed. Widely patent ICA stent. Left Carotid: Velocities in the left ICA are consistent with a 1-39% stenosis.               Non-hemodynamically significant plaque <50% noted in the CCA. The               ECA appears >50% stenosed. Vertebrals:  Bilateral vertebral  arteries demonstrate antegrade flow. Subclavians: Normal flow hemodynamics were seen in bilateral subclavian              arteries. *See table(s) above for measurements and observations.  Electronically signed by Levora Dredge MD on 12/14/2023 at 4:10:28 PM.    Final    MM 3D SCREENING MAMMOGRAM UNILATERAL RIGHT BREAST Result Date:  11/10/2023 CLINICAL DATA:  Screening. EXAM: DIGITAL SCREENING UNILATERAL RIGHT MAMMOGRAM WITH CAD AND TOMOSYNTHESIS TECHNIQUE: Right screening digital craniocaudal and mediolateral oblique mammograms were obtained. Right screening digital breast tomosynthesis was performed. The images were evaluated with computer-aided detection. COMPARISON:  Previous exam(s). ACR Breast Density Category c: The fibroglandular pattern is heterogeneously dense, which may obscure small masses. FINDINGS: There are no findings suspicious for malignancy. History of left mastectomy. IMPRESSION: No mammographic evidence of malignancy. A result letter of this screening mammogram will be mailed directly to the patient. RECOMMENDATION: Screening mammogram in one year. (Code:SM-B-01Y) BI-RADS CATEGORY  1: Negative. Electronically Signed   By: Edwin Cap M.D.   On: 11/10/2023 12:49

## 2024-01-06 NOTE — Assessment & Plan Note (Addendum)
Left breast invasive carcinoma, multiple sites.  ER 90%, PR 11-50%, HER2-cT3 N0, Ki-67 was 20%.  neoadjuvant chemotherapy ddAC x 4 followed by weekly Taxol--followed left mastectomy ypT2 pN0- adjuvant RT due to pectoralis muscle abut - lymph node negative disease.no need for adjuvant abemaciclib Currently on endocrine therapy with Arimidex.  She tolerates well. Continue Arimidex 1mg  daily, refills were sent.  annual screening mammogram of right breast. Dec 2024- results were reviewed.

## 2024-01-07 LAB — CANCER ANTIGEN 27.29: CA 27.29: 28.3 U/mL (ref 0.0–38.6)

## 2024-01-07 LAB — CANCER ANTIGEN 15-3: CA 15-3: 17.1 U/mL (ref 0.0–25.0)

## 2024-02-10 ENCOUNTER — Ambulatory Visit
Admission: RE | Admit: 2024-02-10 | Discharge: 2024-02-10 | Disposition: A | Discharge status: Home / Self Care | Payer: PPO | Source: Ambulatory Visit | Attending: Radiation Oncology | Admitting: Radiation Oncology

## 2024-02-10 ENCOUNTER — Encounter: Payer: Self-pay | Admitting: Radiation Oncology

## 2024-02-10 VITALS — BP 177/82 | HR 82 | Temp 99.9°F | Resp 16 | Wt 165.0 lb

## 2024-02-10 DIAGNOSIS — C50412 Malignant neoplasm of upper-outer quadrant of left female breast: Secondary | ICD-10-CM | POA: Diagnosis present

## 2024-02-10 DIAGNOSIS — Z79811 Long term (current) use of aromatase inhibitors: Secondary | ICD-10-CM | POA: Diagnosis not present

## 2024-02-10 DIAGNOSIS — Z923 Personal history of irradiation: Secondary | ICD-10-CM | POA: Diagnosis not present

## 2024-02-10 DIAGNOSIS — C50919 Malignant neoplasm of unspecified site of unspecified female breast: Secondary | ICD-10-CM

## 2024-02-10 DIAGNOSIS — Z170 Estrogen receptor positive status [ER+]: Secondary | ICD-10-CM | POA: Insufficient documentation

## 2024-02-10 NOTE — Progress Notes (Signed)
 Radiation Oncology Follow up Note  Name: Tammy Nunez   Date:   02/10/2024 MRN:  [id number] DOB: 1950/12/19    This 73 y.o. female presents to the clinic today for 1-1/2-year follow-up status post radiation therapy to her left chest wall and peripheral lymphatics for pathologic stage T2 N0 grade 3 invasive mammary carcinoma encroaching on the pectoralis muscle.  REFERRING PROVIDER: Dana Allan, MD  HPI: Patient is a 73 year old female now out a year and a half having completed radiation therapy in the adjuvant setting to her left chest wall and peripheral lymphatics for a stage T2 N0 grade 3 invasive mammary carcinoma encroaching on the pectoralis muscle seen today in routine follow-up she is doing well.  She specifically denies any chest wall pain or discomfort any swelling in her left upper extremity cough or bone pain..  She is currently on Arimidex tolerating that well without side effect.  COMPLICATIONS OF TREATMENT: none  FOLLOW UP COMPLIANCE: keeps appointments   PHYSICAL EXAM:  BP (!) 177/82 Comment: Patient will monitor at home and notify PCP if BP remains elevated denies S?S of HTN  Pulse 82   Temp 99.9 F (37.7 C) (Tympanic)   Resp 16   Wt 165 lb (74.8 kg)   BMI 31.18 kg/m  Patient status post left modified radical mastectomy chest wall is clear without evidence of mass or nodularity right breast is free of dominant mass.  No axillary or supraclavicular adenopathy is identified.  No evidence of lymphedema in her left upper extremity is noted.  Well-developed well-nourished patient in NAD. HEENT reveals PERLA, EOMI, discs not visualized.  Oral cavity is clear. No oral mucosal lesions are identified. Neck is clear without evidence of cervical or supraclavicular adenopathy. Lungs are clear to A&P. Cardiac examination is essentially unremarkable with regular rate and rhythm without murmur rub or thrill. Abdomen is benign with no organomegaly or masses noted. Motor sensory and  DTR levels are equal and symmetric in the upper and lower extremities. Cranial nerves II through XII are grossly intact. Proprioception is intact. No peripheral adenopathy or edema is identified. No motor or sensory levels are noted. Crude visual fields are within normal range.  RADIOLOGY RESULTS: No current films to review  PLAN: Present time I will turn follow-up care over to medical oncology.  I would be happy to reevaluate the patient anytime should that be indicated.  Patient knows to call with any concerns.  She continues on Arimidex without side effect.  I would like to take this opportunity to thank you for allowing me to participate in the care of your patient.Carmina Miller, MD

## 2024-02-18 ENCOUNTER — Telehealth: Payer: Self-pay | Admitting: Family Medicine

## 2024-02-18 NOTE — Telephone Encounter (Signed)
 Dr Clent Ridges is leaving the practice and your appointment on 06/13/24 needs to be rescheduled with another provider. Also you will need to schedule a transfer of care appointment with another provider to continue care at this office. Please call the office to schedule a Transfer of Care to either Dr Charlann Lange, MD, Bethanie Dicker, NP or Kara Dies, NP.   Thank you  ** E2C2, when this patient calls back, please let them know we have rescheduled their appointment has been rescheduled as a TOC with Dr Charlann Lange in August. [facility]

## 2024-03-02 ENCOUNTER — Inpatient Hospital Stay: Payer: PPO | Attending: Radiation Oncology

## 2024-03-04 ENCOUNTER — Other Ambulatory Visit: Payer: Self-pay | Admitting: Family Medicine

## 2024-03-04 DIAGNOSIS — E1165 Type 2 diabetes mellitus with hyperglycemia: Secondary | ICD-10-CM

## 2024-04-25 LAB — HM DIABETES EYE EXAM

## 2024-04-27 ENCOUNTER — Inpatient Hospital Stay: Payer: PPO | Attending: Radiation Oncology

## 2024-04-27 DIAGNOSIS — Z79811 Long term (current) use of aromatase inhibitors: Secondary | ICD-10-CM | POA: Diagnosis not present

## 2024-04-27 DIAGNOSIS — Z170 Estrogen receptor positive status [ER+]: Secondary | ICD-10-CM | POA: Diagnosis not present

## 2024-04-27 DIAGNOSIS — Z452 Encounter for adjustment and management of vascular access device: Secondary | ICD-10-CM | POA: Insufficient documentation

## 2024-04-27 DIAGNOSIS — C50812 Malignant neoplasm of overlapping sites of left female breast: Secondary | ICD-10-CM | POA: Diagnosis not present

## 2024-04-27 DIAGNOSIS — Z95828 Presence of other vascular implants and grafts: Secondary | ICD-10-CM

## 2024-04-27 MED ORDER — HEPARIN SOD (PORK) LOCK FLUSH 100 UNIT/ML IV SOLN
500.0000 [IU] | Freq: Once | INTRAVENOUS | Status: AC
  Administered 2024-04-27: 500 [IU] via INTRAVENOUS
  Filled 2024-04-27: qty 5

## 2024-04-27 MED ORDER — SODIUM CHLORIDE 0.9% FLUSH
10.0000 mL | Freq: Once | INTRAVENOUS | Status: AC
  Administered 2024-04-27: 10 mL via INTRAVENOUS
  Filled 2024-04-27: qty 10

## 2024-06-06 ENCOUNTER — Other Ambulatory Visit: Payer: Self-pay | Admitting: Family Medicine

## 2024-06-06 DIAGNOSIS — E1165 Type 2 diabetes mellitus with hyperglycemia: Secondary | ICD-10-CM

## 2024-06-10 ENCOUNTER — Encounter: Payer: Self-pay | Admitting: Nurse Practitioner

## 2024-06-10 ENCOUNTER — Ambulatory Visit: Admitting: Nurse Practitioner

## 2024-06-10 VITALS — BP 136/82 | HR 100 | Temp 98.1°F | Ht 61.0 in | Wt 172.2 lb

## 2024-06-10 DIAGNOSIS — E785 Hyperlipidemia, unspecified: Secondary | ICD-10-CM

## 2024-06-10 DIAGNOSIS — E1165 Type 2 diabetes mellitus with hyperglycemia: Secondary | ICD-10-CM

## 2024-06-10 DIAGNOSIS — E118 Type 2 diabetes mellitus with unspecified complications: Secondary | ICD-10-CM

## 2024-06-10 DIAGNOSIS — I152 Hypertension secondary to endocrine disorders: Secondary | ICD-10-CM | POA: Diagnosis not present

## 2024-06-10 DIAGNOSIS — E1169 Type 2 diabetes mellitus with other specified complication: Secondary | ICD-10-CM

## 2024-06-10 DIAGNOSIS — Z7984 Long term (current) use of oral hypoglycemic drugs: Secondary | ICD-10-CM

## 2024-06-10 DIAGNOSIS — E1159 Type 2 diabetes mellitus with other circulatory complications: Secondary | ICD-10-CM | POA: Diagnosis not present

## 2024-06-10 MED ORDER — METFORMIN HCL ER 500 MG PO TB24: ORAL_TABLET | ORAL | 6 refills | Status: DC

## 2024-06-10 NOTE — Assessment & Plan Note (Addendum)
 Hypertension controlled with amlodipine , lisinopril , and carvedilol . Previous high readings stress-related. - Continue current antihypertensive regimen with amlodipine , lisinopril , and carvedilol .

## 2024-06-10 NOTE — Assessment & Plan Note (Addendum)
 Lab Results  Component Value Date   CHOL 192 02/11/2023   HDL 72.30 02/11/2023   LDLCALC 95 02/11/2023   TRIG 123.0 02/11/2023   CHOLHDL 3 02/11/2023  -Chronic.  Stable.  -Continue atorvastatin  80 mg and Zetia  10 mg daily

## 2024-06-10 NOTE — Assessment & Plan Note (Addendum)
 Diabetes managed with metformin  and Jardiance . A1c at 6.9% indicates good control. Metformin  safe with normal kidney function. - Send updated prescription for metformin : 2000 mg in the morning and 500 mg in the evening. - Order fasting labs to check A1c and kidney function.

## 2024-06-10 NOTE — Patient Instructions (Signed)
 Today, we reviewed your health conditions and made updates to your medications and follow-up plans. Your diabetes and hypertension are well-managed, and we discussed your history of breast cancer and stroke. We also addressed your vitamin D  deficiency and general health maintenance.  YOUR PLAN:  DIABETES MELLITUS TYPE 2: Your diabetes is managed with metformin  and Jardiance , and your recent A1c level indicates good control. -Continue taking metformin : 1000 mg in the morning and 500 mg in the evening. -We will order fasting labs to check your A1c and kidney function.  HYPERTENSION: Your blood pressure is controlled with your current medications. -Continue taking amlodipine , lisinopril , and carvedilol  as prescribed.  HYPERLIPIDEMIA: Your cholesterol levels need reassessment. -We will order fasting labs to check your cholesterol levels.  STROKE: You have residual left-sided weakness from a previous stroke and are on Plavix . -No changes to your current treatment. Continue taking Plavix .  BREAST CANCER: Your breast cancer is in remission, and you are continuing treatment with Arimidex . -Continue follow-up with your oncologist, Dr. Babara.

## 2024-06-10 NOTE — Progress Notes (Signed)
 Established Patient Office Visit  Subjective:  Patient ID: Tammy Nunez, female    DOB: 25-Nov-1950  Age: 73 y.o. MRN: [id number]  CC:  Chief Complaint  Patient presents with   Establish Care    Transfer of Care  Discussed the use of a AI scribe software for clinical note transcription with the patient, who gave verbal consent to proceed.   HPI  Tammy Nunez is a 73 year old female who presents for transfer of care. She is accompanied by her husband.  She has hypertension managed with amlodipine , lisinopril , and carvedilol . Her recent blood pressure was 136/82 mmHg. She takes metformin  and Jardiance  for diabetes, with home blood sugar readings between 90 and 123 mg/dL and a recent J8r of 3.0%.  She has a history of breast cancer treated with a left mastectomy, chemotherapy, and radiation. She continues on Arimidex  and has regular follow-ups with her oncologist and surgeon.  She experienced a stroke with residual left-sided weakness and uses a cane. She performs some exercises at home and denies numbness or tingling. She occasionally has diarrhea after sweets.  She quit smoking in 2020 and does not drink alcohol. She previously worked in a factory and enjoys cooking and gardening with her husband's help.  HPI   Past Medical History:  Diagnosis Date   AKI (acute kidney injury) (HCC)    Anemia    Annual physical exam 10/09/2021   Breast cancer (HCC) 02/17/2022   Breast cancer (HCC) 02/17/2022   Breast cancer in female [facility]) 05/08/2021   Breast cancer in female [facility]) 05/08/2021   Surgery appt sch 05/09/21 Dr. Kandee Capron clinic         DIAGNOSIS:  A. BREAST, LEFT, 12 O'CLOCK, 5 CM FROM NIPPLE; ULTRASOUND-GUIDED CORE  BIOPSY:  - INVASIVE MAMMARY CARCINOMA, NO SPECIAL TYPE.  Size of invasive carcinoma: 2.5 mm in this sample  Histologic grade of invasive carcinoma: Grade 3                       Glandular/tubular differentiation score: 3                       Nu    Carotid stenosis, symptomatic, with infarction (HCC) 07/20/2019   Chemotherapy induced nausea and vomiting 06/28/2021   Chronic left shoulder pain    Coronary artery disease    x 1 stent   Diabetes mellitus without complication (HCC)    Dysrhythmia    Essential hypertension    Hypertension    Hypomagnesemia 09/13/2021   Invasive carcinoma of breast (HCC) 05/08/2021   New onset type 2 diabetes mellitus (HCC)    Numbness 06/03/2021   Obesity, diabetes, and hypertension syndrome (HCC) 10/11/2020   Paralysis (HCC)    weakness left upper amd lower extremity   PONV (postoperative nausea and vomiting)    Seizures (HCC)    Stroke [facility])     Past Surgical History:  Procedure Laterality Date   BREAST BIOPSY Left 05/07/2021   12:00 5cmfn vision - INVASIVE MAMMARY CARCINOMA,   BREAST BIOPSY Left 05/07/2021   11:00 10 cmfn mini cork clip INVASIVE MAMMARY CARCINOMA,   CAROTID PTA/STENT INTERVENTION Left 07/20/2019   Procedure: CAROTID PTA/STENT INTERVENTION;  Surgeon: Jama Cordella MATSU, MD;  Location: [facility] INVASIVE CV LAB;  Service: Cardiovascular;  Laterality: Left;   ECTOPIC PREGNANCY SURGERY     PORTACATH PLACEMENT N/A 06/06/2021   Procedure: INSERTION PORT-A-CATH;  Surgeon: Rodolph Romano, MD;  Location: [facility] ORS;  Service: General;  Laterality: N/A;   SENTINEL NODE BIOPSY Left 02/17/2022   Procedure: SENTINEL NODE BIOPSY;  Surgeon: Rodolph Romano, MD;  Location: [facility] ORS;  Service: General;  Laterality: Left;   TOTAL MASTECTOMY Left 02/17/2022   Procedure: TOTAL MASTECTOMY;  Surgeon: Rodolph Romano, MD;  Location: [facility] ORS;  Service: General;  Laterality: Left;    Family History  Problem Relation Age of Onset   Diabetes type II Mother    Hypertension Father    Heart attack Father    Diabetes type II Brother    Breast cancer Neg Hx     Social History   Socioeconomic History   Marital status: Married    Spouse name: Marsha   Number of children: Not on  file   Years of education: Not on file   Highest education level: Not on file  Occupational History   Not on file  Tobacco Use   Smoking status: Former    Current packs/day: 0.00    Average packs/day: 0.5 packs/day for 15.0 years (7.5 ttl pk-yrs)    Types: Cigarettes    Start date: 04/10/2004    Quit date: 04/11/2019    Years since quitting: 5.2   Smokeless tobacco: Never  Vaping Use   Vaping status: Never Used  Substance and Sexual Activity   Alcohol use: Never   Drug use: Never   Sexual activity: Not Currently  Other Topics Concern   Not on file  Social History Narrative   ** Merged History Encounter **       No kids  Married with husband  Used to work [employer], dispensing     Social Drivers of Corporate investment banker Strain: Low Risk  (09/04/2022)   Overall Financial Resource Strain (CARDIA)    Difficulty of Paying Living Expenses: Not hard at all  Food Insecurity: No Food Insecurity (09/04/2022)   Hunger Vital Sign    Worried About Running Out of Food in the Last Year: Never true    Ran Out of Food in the Last Year: Never true  Transportation Needs: No Transportation Needs (09/04/2022)   PRAPARE - Administrator, Civil Service (Medical): No    Lack of Transportation (Non-Medical): No  Physical Activity: Sufficiently Active (09/04/2022)   Exercise Vital Sign    Days of Exercise per Week: 7 days    Minutes of Exercise per Session: 30 min  Stress: No Stress Concern Present (09/04/2022)   [employer] of Occupational Health - Occupational Stress Questionnaire    Feeling of Stress : Not at all  Social Connections: Socially Integrated (09/04/2022)   Social Connection and Isolation Panel    Frequency of Communication with Friends and Family: Three times a week    Frequency of Social Gatherings with Friends and Family: Once a week    Attends Religious Services: 1 to 4 times per year    Active Member of [employer] or Organizations: Yes     Attends Banker Meetings: 1 to 4 times per year    Marital Status: Married  Catering manager Violence: Not At Risk (09/04/2022)   Humiliation, Afraid, Rape, and Kick questionnaire    Fear of Current or Ex-Partner: No    Emotionally Abused: No    Physically Abused: No    Sexually Abused: No     Outpatient Medications Prior to Visit  Medication Sig Dispense Refill   amLODipine  (NORVASC ) 5 MG tablet Take 1 tablet (5  mg total) by mouth daily. 90 tablet 3   anastrozole  (ARIMIDEX ) 1 MG tablet Take 1 tablet (1 mg total) by mouth daily. 90 tablet 1   aspirin  EC 81 MG tablet Take 1 tablet (81 mg total) by mouth daily. 90 tablet 3   blood glucose meter kit and supplies 1 each by Other route 4 (four) times daily. Dispense based on patient and insurance preference. Four times daily as directed. (FOR ICD-10 E10.9, E11.9). 1 each 0   calcium  carbonate (OS-CAL) 600 MG tablet Take 2 tablets (1,200 mg total) by mouth daily. 180 tablet 3   carvedilol  (COREG ) 25 MG tablet Take 1 tablet (25 mg total) by mouth 2 (two) times daily with a meal. 180 tablet 3   clopidogrel  (PLAVIX ) 75 MG tablet Take 1 tablet (75 mg total) by mouth daily. 90 tablet 3   Continuous Glucose Sensor (FREESTYLE LIBRE 2 SENSOR) MISC USE TO CHECK BLOOD SUGAR AT LEAST EVERY 8 HOURS 6 each 5   empagliflozin  (JARDIANCE ) 25 MG TABS tablet Take 1 tablet (25 mg total) by mouth daily. 90 tablet 3   ezetimibe  (ZETIA ) 10 MG tablet Take 1 tablet (10 mg total) by mouth daily. 90 tablet 3   lidocaine -prilocaine  (EMLA ) cream APPLY  CREAM TOPICALLY TO AFFECTED AREA AS NEEDED PRIOR TO PORT ACCESS. 30 g 1   Multiple Vitamin (MULTIVITAMIN) tablet Take 1 tablet by mouth daily.     potassium chloride  SA (KLOR-CON  M) 20 MEQ tablet Take 1 tablet (20 mEq total) by mouth daily. 3 tablet 0   vitamin B-12 (CYANOCOBALAMIN ) 1000 MCG tablet Take 1 tablet (1,000 mcg total) by mouth daily. 30 tablet 1   Vitamin D , Cholecalciferol, 25 MCG (1000 UT) TABS  Take 1,000 Units by mouth.     atorvastatin  (LIPITOR ) 80 MG tablet Take 1 tablet (80 mg total) by mouth daily. 90 tablet 3   lisinopril  (ZESTRIL ) 40 MG tablet Take 1 tablet (40 mg total) by mouth daily. 90 tablet 3   metFORMIN  (GLUCOPHAGE -XR) 500 MG 24 hr tablet Take 1 tablet by mouth once daily with breakfast 90 tablet 0   Facility-Administered Medications Prior to Visit  Medication Dose Route Frequency Provider Last Rate Last Admin   heparin  lock flush 100 UNIT/ML injection             No Known Allergies  ROS Review of Systems Negative unless indicated in HPI.    Objective:    Physical Exam  BP 136/82   Pulse 100   Temp 98.1 F (36.7 C)   Ht 5' 1 (1.549 m)   Wt 172 lb 3.2 oz (78.1 kg)   SpO2 97%   BMI 32.54 kg/m  Wt Readings from Last 3 Encounters:  06/10/24 172 lb 3.2 oz (78.1 kg)  02/10/24 165 lb (74.8 kg)  01/06/24 169 lb (76.7 kg)     Health Maintenance  Topic Date Due   Medicare Annual Wellness (AWV)  09/05/2023   FOOT EXAM  02/24/2024   COVID-19 Vaccine (5 - 2024-25 season) 06/24/2024 (Originally 07/26/2023)   DTaP/Tdap/Td (1 - Tdap) 12/14/2024 (Originally 01/31/1970)   Colonoscopy  12/14/2024 (Originally 02/01/1996)   INFLUENZA VACCINE  06/24/2024   MAMMOGRAM  11/08/2024   HEMOGLOBIN A1C  12/18/2024   OPHTHALMOLOGY EXAM  04/25/2025   Diabetic kidney evaluation - eGFR measurement  06/17/2025   Diabetic kidney evaluation - Urine ACR  06/17/2025   DEXA SCAN  Completed   Hepatitis C Screening  Completed   Hepatitis B Vaccines  Aged Out   HPV VACCINES  Aged Out   Meningococcal B Vaccine  Aged Out   Pneumococcal Vaccine: 50+ Years  Discontinued   Zoster Vaccines- Shingrix  Discontinued    There are no preventive care reminders to display for this patient.  Lab Results  Component Value Date   TSH 2.67 01/03/2021   Lab Results  Component Value Date   WBC 5.5 06/17/2024   HGB 14.7 06/17/2024   HCT 43.9 06/17/2024   MCV 94.3 06/17/2024   PLT 174.0  06/17/2024   Lab Results  Component Value Date   NA 140 06/17/2024   K 3.4 (L) 06/17/2024   CO2 24 06/17/2024   GLUCOSE 115 (H) 06/17/2024   BUN 16 06/17/2024   CREATININE 0.74 06/17/2024   BILITOT 0.5 06/17/2024   ALKPHOS 61 06/17/2024   AST 23 06/17/2024   ALT 26 06/17/2024   PROT 7.2 06/17/2024   ALBUMIN 4.9 06/17/2024   CALCIUM  9.5 06/17/2024   ANIONGAP 10 01/06/2024   GFR 80.29 06/17/2024   Lab Results  Component Value Date   CHOL 95 06/17/2024   Lab Results  Component Value Date   HDL 53.70 06/17/2024   Lab Results  Component Value Date   LDLCALC 27 06/17/2024   Lab Results  Component Value Date   TRIG 71.0 06/17/2024   Lab Results  Component Value Date   CHOLHDL 2 06/17/2024   Lab Results  Component Value Date   HGBA1C 7.0 (H) 06/17/2024      Assessment & Plan:  Type 2 diabetes mellitus with complications (HCC) Assessment & Plan: Diabetes managed with metformin  and Jardiance . A1c at 6.9% indicates good control. Metformin  safe with normal kidney function. - Send updated prescription for metformin : 2000 mg in the morning and 500 mg in the evening. - Order fasting labs to check A1c and kidney function.    Orders: -     Lipid panel; Future -     Comprehensive metabolic panel with GFR; Future -     Hemoglobin A1c; Future -     Microalbumin / creatinine urine ratio; Future -     metFORMIN  HCl ER; Take 2 tablet in the morning and 1 tablet in th evening  Dispense: 90 tablet; Refill: 6  Hypertension associated with diabetes (HCC) Assessment & Plan: Hypertension controlled with amlodipine , lisinopril , and carvedilol . Previous high readings stress-related. - Continue current antihypertensive regimen with amlodipine , lisinopril , and carvedilol .   Orders: -     CBC with Differential/Platelet; Future  Hyperlipidemia associated with type 2 diabetes mellitus [facility]) Assessment & Plan: Lab Results  Component Value Date   CHOL 192 02/11/2023   HDL 72.30  02/11/2023   LDLCALC 95 02/11/2023   TRIG 123.0 02/11/2023   CHOLHDL 3 02/11/2023  -Chronic.  Stable.  -Continue atorvastatin  80 mg and Zetia  10 mg daily   Orders: -     Lipid panel; Future    Follow-up: Return in about 6 months (around 12/11/2024) for  chronic management and fasting labs in a week.   Jermey Closs, NP

## 2024-06-13 ENCOUNTER — Ambulatory Visit: Payer: PPO | Admitting: Family Medicine

## 2024-06-14 ENCOUNTER — Other Ambulatory Visit: Payer: Self-pay

## 2024-06-14 DIAGNOSIS — I10 Essential (primary) hypertension: Secondary | ICD-10-CM

## 2024-06-14 DIAGNOSIS — I639 Cerebral infarction, unspecified: Secondary | ICD-10-CM

## 2024-06-14 DIAGNOSIS — E785 Hyperlipidemia, unspecified: Secondary | ICD-10-CM

## 2024-06-14 MED ORDER — ATORVASTATIN CALCIUM 80 MG PO TABS: 80.0000 mg | ORAL_TABLET | Freq: Every day | ORAL | 3 refills | Status: DC

## 2024-06-14 MED ORDER — LISINOPRIL 40 MG PO TABS: 40.0000 mg | ORAL_TABLET | Freq: Every day | ORAL | 3 refills | Status: DC

## 2024-06-17 ENCOUNTER — Other Ambulatory Visit

## 2024-06-17 DIAGNOSIS — E1159 Type 2 diabetes mellitus with other circulatory complications: Secondary | ICD-10-CM | POA: Diagnosis not present

## 2024-06-17 DIAGNOSIS — I152 Hypertension secondary to endocrine disorders: Secondary | ICD-10-CM | POA: Diagnosis not present

## 2024-06-17 DIAGNOSIS — E785 Hyperlipidemia, unspecified: Secondary | ICD-10-CM

## 2024-06-17 DIAGNOSIS — E118 Type 2 diabetes mellitus with unspecified complications: Secondary | ICD-10-CM | POA: Diagnosis not present

## 2024-06-17 DIAGNOSIS — E1169 Type 2 diabetes mellitus with other specified complication: Secondary | ICD-10-CM | POA: Diagnosis not present

## 2024-06-17 LAB — LIPID PANEL
Cholesterol: 95 mg/dL (ref 0–200)
HDL: 53.7 mg/dL (ref >39.00)
LDL Cholesterol: 27 mg/dL (ref 0–99)
NonHDL: 41.32
Total CHOL/HDL Ratio: 2
Triglycerides: 71 mg/dL (ref 0.0–149.0)
VLDL: 14.2 mg/dL (ref 0.0–40.0)

## 2024-06-17 LAB — CBC WITH DIFFERENTIAL/PLATELET
Basophils Absolute: 0 K/uL (ref 0.0–0.1)
Basophils Relative: 0.6 % (ref 0.0–3.0)
Eosinophils Absolute: 0.2 K/uL (ref 0.0–0.7)
Eosinophils Relative: 3.5 % (ref 0.0–5.0)
HCT: 43.9 % (ref 36.0–46.0)
Hemoglobin: 14.7 g/dL (ref 12.0–15.0)
Lymphocytes Relative: 26.6 % (ref 12.0–46.0)
Lymphs Abs: 1.5 K/uL (ref 0.7–4.0)
MCHC: 33.5 g/dL (ref 30.0–36.0)
MCV: 94.3 fl (ref 78.0–100.0)
Monocytes Absolute: 0.5 K/uL (ref 0.1–1.0)
Monocytes Relative: 9.5 % (ref 3.0–12.0)
Neutro Abs: 3.3 K/uL (ref 1.4–7.7)
Neutrophils Relative %: 59.8 % (ref 43.0–77.0)
Platelets: 174 K/uL (ref 150.0–400.0)
RBC: 4.66 Mil/uL (ref 3.87–5.11)
RDW: 14.3 % (ref 11.5–15.5)
WBC: 5.5 K/uL (ref 4.0–10.5)

## 2024-06-17 LAB — COMPREHENSIVE METABOLIC PANEL WITH GFR
ALT: 26 U/L (ref 0–35)
AST: 23 U/L (ref 0–37)
Albumin: 4.9 g/dL (ref 3.5–5.2)
Alkaline Phosphatase: 61 U/L (ref 39–117)
BUN: 16 mg/dL (ref 6–23)
CO2: 24 meq/L (ref 19–32)
Calcium: 9.5 mg/dL (ref 8.4–10.5)
Chloride: 102 meq/L (ref 96–112)
Creatinine, Ser: 0.74 mg/dL (ref 0.40–1.20)
GFR: 80.29 mL/min (ref >60.00)
Glucose, Bld: 115 mg/dL — ABNORMAL HIGH (ref 70–99)
Potassium: 3.4 meq/L — ABNORMAL LOW (ref 3.5–5.1)
Sodium: 140 meq/L (ref 135–145)
Total Bilirubin: 0.5 mg/dL (ref 0.2–1.2)
Total Protein: 7.2 g/dL (ref 6.0–8.3)

## 2024-06-17 LAB — MICROALBUMIN / CREATININE URINE RATIO
Creatinine,U: 6.4 mg/dL
Microalb Creat Ratio: UNDETERMINED mg/g (ref 0.0–30.0)
Microalb, Ur: <0.7 mg/dL

## 2024-06-17 LAB — HEMOGLOBIN A1C: Hgb A1c MFr Bld: 7 % — ABNORMAL HIGH (ref 4.6–6.5)

## 2024-06-22 ENCOUNTER — Inpatient Hospital Stay: Payer: PPO | Attending: Radiation Oncology

## 2024-06-22 DIAGNOSIS — C50812 Malignant neoplasm of overlapping sites of left female breast: Secondary | ICD-10-CM | POA: Diagnosis not present

## 2024-06-22 DIAGNOSIS — Z79811 Long term (current) use of aromatase inhibitors: Secondary | ICD-10-CM | POA: Diagnosis not present

## 2024-06-22 DIAGNOSIS — Z452 Encounter for adjustment and management of vascular access device: Secondary | ICD-10-CM | POA: Insufficient documentation

## 2024-06-22 DIAGNOSIS — Z170 Estrogen receptor positive status [ER+]: Secondary | ICD-10-CM | POA: Insufficient documentation

## 2024-06-22 DIAGNOSIS — Z95828 Presence of other vascular implants and grafts: Secondary | ICD-10-CM

## 2024-06-22 MED ORDER — SODIUM CHLORIDE 0.9% FLUSH
10.0000 mL | Freq: Once | INTRAVENOUS | Status: AC
  Administered 2024-06-22: 10 mL via INTRAVENOUS
  Filled 2024-06-22: qty 10

## 2024-06-22 MED ORDER — HEPARIN SOD (PORK) LOCK FLUSH 100 UNIT/ML IV SOLN
500.0000 [IU] | Freq: Once | INTRAVENOUS | Status: AC
  Administered 2024-06-22: 500 [IU] via INTRAVENOUS
  Filled 2024-06-22: qty 5

## 2024-06-30 ENCOUNTER — Ambulatory Visit: Payer: Self-pay | Admitting: Nurse Practitioner

## 2024-06-30 NOTE — Progress Notes (Signed)
 The blood work shows no sign of anemia. Potassium slightly low. Consume potassium rich diet including banana, orange juice, Cereal, bran with raisins, dry fruits, spinach, broccoli, beans, potatoes, cantaloupe, avocado and coconut water. A1C stable. Liver, kidney function normal.

## 2024-07-06 ENCOUNTER — Inpatient Hospital Stay (HOSPITAL_BASED_OUTPATIENT_CLINIC_OR_DEPARTMENT_OTHER): Payer: PPO | Admitting: Oncology

## 2024-07-06 ENCOUNTER — Inpatient Hospital Stay: Payer: PPO | Attending: Radiation Oncology

## 2024-07-06 ENCOUNTER — Encounter: Payer: Self-pay | Admitting: Oncology

## 2024-07-06 VITALS — BP 167/81 | HR 88 | Temp 97.2°F | Resp 6 | Wt 171.8 lb

## 2024-07-06 DIAGNOSIS — Z95828 Presence of other vascular implants and grafts: Secondary | ICD-10-CM

## 2024-07-06 DIAGNOSIS — Z87891 Personal history of nicotine dependence: Secondary | ICD-10-CM | POA: Diagnosis not present

## 2024-07-06 DIAGNOSIS — C50919 Malignant neoplasm of unspecified site of unspecified female breast: Secondary | ICD-10-CM

## 2024-07-06 DIAGNOSIS — Z79811 Long term (current) use of aromatase inhibitors: Secondary | ICD-10-CM | POA: Diagnosis not present

## 2024-07-06 DIAGNOSIS — C50912 Malignant neoplasm of unspecified site of left female breast: Secondary | ICD-10-CM | POA: Insufficient documentation

## 2024-07-06 DIAGNOSIS — Z923 Personal history of irradiation: Secondary | ICD-10-CM | POA: Diagnosis not present

## 2024-07-06 LAB — CBC WITH DIFFERENTIAL (CANCER CENTER ONLY)
Abs Immature Granulocytes: 0.02 K/uL (ref 0.00–0.07)
Basophils Absolute: 0.1 K/uL (ref 0.0–0.1)
Basophils Relative: 1 %
Eosinophils Absolute: 0.2 K/uL (ref 0.0–0.5)
Eosinophils Relative: 4 %
HCT: 40.5 % (ref 36.0–46.0)
Hemoglobin: 13.6 g/dL (ref 12.0–15.0)
Immature Granulocytes: 0 %
Lymphocytes Relative: 29 %
Lymphs Abs: 1.5 K/uL (ref 0.7–4.0)
MCH: 31.8 pg (ref 26.0–34.0)
MCHC: 33.6 g/dL (ref 30.0–36.0)
MCV: 94.6 fL (ref 80.0–100.0)
Monocytes Absolute: 0.6 K/uL (ref 0.1–1.0)
Monocytes Relative: 12 %
Neutro Abs: 2.8 K/uL (ref 1.7–7.7)
Neutrophils Relative %: 54 %
Platelet Count: 187 K/uL (ref 150–400)
RBC: 4.28 MIL/uL (ref 3.87–5.11)
RDW: 13.8 % (ref 11.5–15.5)
WBC Count: 5.1 K/uL (ref 4.0–10.5)
nRBC: 0 % (ref 0.0–0.2)

## 2024-07-06 LAB — CMP (CANCER CENTER ONLY)
ALT: 26 U/L (ref 0–44)
AST: 25 U/L (ref 15–41)
Albumin: 4.2 g/dL (ref 3.5–5.0)
Alkaline Phosphatase: 57 U/L (ref 38–126)
Anion gap: 10 (ref 5–15)
BUN: 18 mg/dL (ref 8–23)
CO2: 24 mmol/L (ref 22–32)
Calcium: 9.6 mg/dL (ref 8.9–10.3)
Chloride: 105 mmol/L (ref 98–111)
Creatinine: 0.86 mg/dL (ref 0.44–1.00)
GFR, Estimated: >60 mL/min (ref >60)
Glucose, Bld: 159 mg/dL — ABNORMAL HIGH (ref 70–99)
Potassium: 3.9 mmol/L (ref 3.5–5.1)
Sodium: 139 mmol/L (ref 135–145)
Total Bilirubin: 0.7 mg/dL (ref 0.0–1.2)
Total Protein: 6.9 g/dL (ref 6.5–8.1)

## 2024-07-06 MED ORDER — ANASTROZOLE 1 MG PO TABS: 1.0000 mg | ORAL_TABLET | Freq: Every day | ORAL | 1 refills | Status: AC

## 2024-07-06 NOTE — Assessment & Plan Note (Signed)
 recommend port flush every 6-8 weeks Discussed about  port removal  and she prefers to keep medi port

## 2024-07-06 NOTE — Assessment & Plan Note (Signed)
 Recommend calcium  1200 mg daily with Vitamin D  supplementation Baseline DEXA 2022 - normal- repeat in June 2024 -Normal  Patient declined  adjuvant bisphosphonate treatment

## 2024-07-06 NOTE — Assessment & Plan Note (Addendum)
 Left breast invasive carcinoma, multiple sites.  ER 90%, PR 11-50%, HER2-cT3 N0, Ki-67 was 20%. Oncotype Dx 27 S/p neoadjuvant chemotherapy ddAC x 4 followed by weekly Taxol  - 2022--followed left mastectomy 01/2022  ypT2 pN0- adjuvant RT due to pectoralis muscle abut - lymph node negative disease.no role for adjuvant abemaciclib Currently on endocrine therapy with Arimidex .  She tolerates well. Continue Arimidex  1mg  daily [since 05/2022], refills were sent.  annual screening mammogram of right breast. Dec 2025-

## 2024-07-06 NOTE — Progress Notes (Signed)
 Hematology/Oncology Progress note [phone] [phone]      Patient Care Team: Vincente Saber, NP as PCP - General (Nurse Practitioner) Dannielle Arlean FALCON, RN (Inactive) as Oncology Nurse Navigator Rodolph Romano, MD as Consulting Physician (General Surgery) Babara Call, MD as Consulting Physician (Oncology) Lenn Aran, MD as Consulting Physician (Radiation Oncology)  ASSESSMENT & PLAN:   Cancer Staging  Invasive carcinoma of breast [facility]) Staging form: Breast, AJCC 8th Edition - Clinical stage from 05/16/2021: Stage IIB (cT3, cN0, cM0, G3, ER+, PR+, HER2-) - Signed by Babara Call, MD on 06/03/2021   Invasive carcinoma of breast (HCC) Left breast invasive carcinoma, multiple sites.  ER 90%, PR 11-50%, HER2-cT3 N0, Ki-67 was 20%. Oncotype Dx 27 S/p neoadjuvant chemotherapy ddAC x 4 followed by weekly Taxol  - 2022--followed left mastectomy 01/2022  ypT2 pN0- adjuvant RT due to pectoralis muscle abut - lymph node negative disease.no role for adjuvant abemaciclib Currently on endocrine therapy with Arimidex .  She tolerates well. Continue Arimidex  1mg  daily [since 05/2022], refills were sent.  annual screening mammogram of right breast. Dec 2025-    Aromatase inhibitor use Recommend calcium  1200 mg daily with Vitamin D  supplementation Baseline DEXA 2022 - normal- repeat in June 2024 -Normal  Patient declined  adjuvant bisphosphonate treatment  Port-A-Cath in place recommend port flush every 6-8 weeks Discussed about  port removal  and she prefers to keep medi port   Orders Placed This Encounter  Procedures   MM 3D SCREEN BREAST UNI RIGHT    Standing Status:   Future    Expected Date:   11/09/2024    Expiration Date:   07/06/2025    Reason for Exam (SYMPTOM  OR DIAGNOSIS REQUIRED):   hx breast cancer    Preferred imaging location?:   [facility]   CBC with Differential (Cancer Center Only)    Standing Status:   Future    Expected Date:    01/06/2025    Expiration Date:   04/06/2025   CMP (Cancer Center only)    Standing Status:   Future    Expected Date:   01/06/2025    Expiration Date:   04/06/2025   Cancer antigen 27.29    Standing Status:   Future    Expected Date:   01/06/2025    Expiration Date:   04/06/2025   Cancer antigen 15-3    Standing Status:   Future    Expected Date:   01/06/2025    Expiration Date:   04/06/2025   Lab MD 6 months flex, cbc cmp All questions were answered. The patient knows to call the clinic with any problems, questions or concerns.  Call Babara, MD, PhD [facility] Hematology Oncology 07/06/2024   CHIEF COMPLAINTS/REASON FOR VISIT:  multifocal left breast cancer  HISTORY OF PRESENTING ILLNESS:   Tammy Nunez is a  73 y.o.  female presents for follow up of multifocal left breast cancer.  Oncology summary listed below.  Oncology History  Breast cancer in female [facility]) (Resolved)  05/08/2021 Initial Diagnosis   Breast cancer in female [facility])   06/03/2021 - 06/03/2021 Chemotherapy         06/21/2021 - 01/21/2022 Chemotherapy   Patient is on Treatment Plan : BREAST ADJUVANT DOSE DENSE AC q14d / PACLitaxel  q7d     Invasive carcinoma of breast (HCC)  05/02/2021 Mammogram   left breast diagnostic mammogram and ultrasound showed multiple irregular mass in the left breast. Mammogram mass 1 irregular mass associated with distortion in the  upper breast at middle depth, multiple adjacent and possible continuous irregular masses, conglomeration of masses measures at least 5.6 cm.  Associated pleomorphic calcifications extending at least 5 x 3.9 x 3.7 cm Mass 2, 1 cm mass in the upper breast at the middle to posterior depth. Mass 3, 5 mm irregular mass in the upper breast at posterior depth. In total, mass 1-mass 3 span at least 8.2 cm in anterior-posterior extent.   By ultrasound  mass 1 12:00 5 cm from nipple, 2.7 x 1.5 x 4.3 cm Mass 2, 12:00 12 cm from nipple, 0.9 x 0.9 x 0.5 cm Mass 3   12:00  14 cm from nipple, 0.4 x 0.4 x 0.4 cm-uses 2.4 cm superior to mass to Mass 4   11:00 No suspicious left axillary adenopathy.   05/07/2021 Initial Diagnosis   Invasive carcinoma of breast  - Ultrasound-guided breast biopsy Breast left 12:00 mass 5 cm from nipple biopsy showed invasive mammary carcinoma, no special type, grade 3, high-grade DCIS present, no LVI, ER 90%, PR 11-50%, HER2 negative by IHC Breast left 11:00 mass 10 cm from nipple biopsy showed invasive mammary carcinoma, no special type, grade 2, no DCIS/LVI identified.ER 90%, PR 11-50%, HER2 negative by IHC     05/16/2021 Cancer Staging   Staging form: Breast, AJCC 8th Edition - Clinical stage from 05/16/2021: Stage IIB (cT3, cN0, cM0, G3, ER+, PR+, HER2-) - Signed by Babara Call, MD on 06/03/2021 Stage prefix: Initial diagnosis Histologic grading system: 3 grade system   05/22/2021 Imaging   MRI breast showed that nonmass like malignancy spanning over 8 cm, abuting anterior aspect of pectoralis muscle. Discussed with Dr.Cintron and we agree that she will need mastectomy. There is concern of possible positive margin. I recommend neoadjuvant chemotherapy   06/06/2021 Miscellaneous   Medi port was placed by Dr.Cintron   06/13/2021 Echocardiogram   pre chemotherapy Echo. LVEF 60-65%.  Normal global longitudinal strain. Grade 1 diastolic dysfunction   06/21/2021 - 10/25/2021 Chemotherapy   ddAC x 4 with GCSF followed by weekly Taxol  x 12 Chemotherapy break due to side effects, UTI and the patient's preference.   02/17/2022 Surgery   Patient underwent left simple mastectomy, and a sentinel lymph node biopsy.  Pathology showed invasive mammary carcinoma, high-grade DCIS, 4 lymph nodes were sampled and were negative for cancer. ypT2 pN0, grade 3, multifocal invasive carcinoma.  All margins are negative for invasive carcinoma and DCIS. Ki-67 was 20%.   04/29/2022 - 06/03/2022 Radiation Therapy   Due to the initial mass potentially abutting the  pectoralis muscle, she received adjuvant chest wall radiation   06/13/2022 -  Anti-estrogen oral therapy   Start on Arimidex  1mg  daily     INTERVAL HISTORY Tammy Nunez is a 73 y.o. female who has above history reviewed by me today presents for follow up visit for  multifocal left breast cancer Patient reports reports feeling well.  Gained weight.  Patient tolerates Arimidex  well. Manageable hot flash No new complains.   Review of Systems  Constitutional:  Negative for appetite change, chills, fatigue and fever.  HENT:   Negative for hearing loss and voice change.   Eyes:  Negative for eye problems.  Respiratory:  Negative for chest tightness and cough.   Cardiovascular:  Negative for chest pain.  Gastrointestinal:  Negative for abdominal distention, abdominal pain, blood in stool, diarrhea and nausea.  Endocrine: Negative for hot flashes.  Genitourinary:  Negative for difficulty urinating, dysuria and frequency.   Musculoskeletal:  Negative for arthralgias.  Skin:  Negative for itching and rash.  Neurological:  Negative for extremity weakness.  Hematological:  Negative for adenopathy. Does not bruise/bleed easily.  Psychiatric/Behavioral:  Negative for confusion.     MEDICAL HISTORY:  Past Medical History:  Diagnosis Date   AKI (acute kidney injury) (HCC)    Anemia    Annual physical exam 10/09/2021   Breast cancer (HCC) 02/17/2022   Breast cancer (HCC) 02/17/2022   Breast cancer in female [facility]) 05/08/2021   Breast cancer in female [facility]) 05/08/2021   Surgery appt sch 05/09/21 Dr. Kandee Capron clinic         DIAGNOSIS:  A. BREAST, LEFT, 12 O'CLOCK, 5 CM FROM NIPPLE; ULTRASOUND-GUIDED CORE  BIOPSY:  - INVASIVE MAMMARY CARCINOMA, NO SPECIAL TYPE.  Size of invasive carcinoma: 2.5 mm in this sample  Histologic grade of invasive carcinoma: Grade 3                       Glandular/tubular differentiation score: 3                       Nu   Carotid stenosis,  symptomatic, with infarction (HCC) 07/20/2019   Chemotherapy induced nausea and vomiting 06/28/2021   Chronic left shoulder pain    Coronary artery disease    x 1 stent   Diabetes mellitus without complication (HCC)    Dysrhythmia    Essential hypertension    Hypertension    Hypomagnesemia 09/13/2021   Invasive carcinoma of breast (HCC) 05/08/2021   New onset type 2 diabetes mellitus (HCC)    Numbness 06/03/2021   Obesity, diabetes, and hypertension syndrome (HCC) 10/11/2020   Paralysis (HCC)    weakness left upper amd lower extremity   PONV (postoperative nausea and vomiting)    Seizures (HCC)    Stroke [facility])     SURGICAL HISTORY: Past Surgical History:  Procedure Laterality Date   BREAST BIOPSY Left 05/07/2021   12:00 5cmfn vision - INVASIVE MAMMARY CARCINOMA,   BREAST BIOPSY Left 05/07/2021   11:00 10 cmfn mini cork clip INVASIVE MAMMARY CARCINOMA,   CAROTID PTA/STENT INTERVENTION Left 07/20/2019   Procedure: CAROTID PTA/STENT INTERVENTION;  Surgeon: Jama Cordella MATSU, MD;  Location: [facility] INVASIVE CV LAB;  Service: Cardiovascular;  Laterality: Left;   ECTOPIC PREGNANCY SURGERY     PORTACATH PLACEMENT N/A 06/06/2021   Procedure: INSERTION PORT-A-CATH;  Surgeon: Rodolph Romano, MD;  Location: [facility] ORS;  Service: General;  Laterality: N/A;   SENTINEL NODE BIOPSY Left 02/17/2022   Procedure: SENTINEL NODE BIOPSY;  Surgeon: Rodolph Romano, MD;  Location: [facility] ORS;  Service: General;  Laterality: Left;   TOTAL MASTECTOMY Left 02/17/2022   Procedure: TOTAL MASTECTOMY;  Surgeon: Rodolph Romano, MD;  Location: [facility] ORS;  Service: General;  Laterality: Left;    SOCIAL HISTORY: Social History   Socioeconomic History   Marital status: Married    Spouse name: Marsha   Number of children: Not on file   Years of education: Not on file   Highest education level: Not on file  Occupational History   Not on file  Tobacco Use   Smoking status: Former     Current packs/day: 0.00    Average packs/day: 0.5 packs/day for 15.0 years (7.5 ttl pk-yrs)    Types: Cigarettes    Start date: 04/10/2004    Quit date: 04/11/2019    Years since quitting: 5.2   Smokeless tobacco: Never  Vaping Use  Vaping status: Never Used  Substance and Sexual Activity   Alcohol use: Never   Drug use: Never   Sexual activity: Not Currently  Other Topics Concern   Not on file  Social History Narrative   ** Merged History Encounter **       No kids  Married with husband  Used to work [employer], dispensing     Social Drivers of Corporate investment banker Strain: Low Risk  (09/04/2022)   Overall Financial Resource Strain (CARDIA)    Difficulty of Paying Living Expenses: Not hard at all  Food Insecurity: No Food Insecurity (09/04/2022)   Hunger Vital Sign    Worried About Running Out of Food in the Last Year: Never true    Ran Out of Food in the Last Year: Never true  Transportation Needs: No Transportation Needs (09/04/2022)   PRAPARE - Administrator, Civil Service (Medical): No    Lack of Transportation (Non-Medical): No  Physical Activity: Sufficiently Active (09/04/2022)   Exercise Vital Sign    Days of Exercise per Week: 7 days    Minutes of Exercise per Session: 30 min  Stress: No Stress Concern Present (09/04/2022)   [employer] of Occupational Health - Occupational Stress Questionnaire    Feeling of Stress : Not at all  Social Connections: Socially Integrated (09/04/2022)   Social Connection and Isolation Panel    Frequency of Communication with Friends and Family: Three times a week    Frequency of Social Gatherings with Friends and Family: Once a week    Attends Religious Services: 1 to 4 times per year    Active Member of [employer] or Organizations: Yes    Attends Banker Meetings: 1 to 4 times per year    Marital Status: Married  Catering manager Violence: Not At Risk (09/04/2022)   Humiliation,  Afraid, Rape, and Kick questionnaire    Fear of Current or Ex-Partner: No    Emotionally Abused: No    Physically Abused: No    Sexually Abused: No    FAMILY HISTORY: Family History  Problem Relation Age of Onset   Diabetes type II Mother    Hypertension Father    Heart attack Father    Diabetes type II Brother    Breast cancer Neg Hx     ALLERGIES:  has no known allergies.  MEDICATIONS:  Current Outpatient Medications  Medication Sig Dispense Refill   amLODipine  (NORVASC ) 5 MG tablet Take 1 tablet (5 mg total) by mouth daily. 90 tablet 3   aspirin  EC 81 MG tablet Take 1 tablet (81 mg total) by mouth daily. 90 tablet 3   atorvastatin  (LIPITOR ) 80 MG tablet Take 1 tablet (80 mg total) by mouth daily. 90 tablet 3   blood glucose meter kit and supplies 1 each by Other route 4 (four) times daily. Dispense based on patient and insurance preference. Four times daily as directed. (FOR ICD-10 E10.9, E11.9). 1 each 0   calcium  carbonate (OS-CAL) 600 MG tablet Take 2 tablets (1,200 mg total) by mouth daily. 180 tablet 3   carvedilol  (COREG ) 25 MG tablet Take 1 tablet (25 mg total) by mouth 2 (two) times daily with a meal. 180 tablet 3   clopidogrel  (PLAVIX ) 75 MG tablet Take 1 tablet (75 mg total) by mouth daily. 90 tablet 3   Continuous Glucose Sensor (FREESTYLE LIBRE 2 SENSOR) MISC USE TO CHECK BLOOD SUGAR AT LEAST EVERY 8 HOURS 6 each 5  empagliflozin  (JARDIANCE ) 25 MG TABS tablet Take 1 tablet (25 mg total) by mouth daily. 90 tablet 3   ezetimibe  (ZETIA ) 10 MG tablet Take 1 tablet (10 mg total) by mouth daily. 90 tablet 3   lidocaine -prilocaine  (EMLA ) cream APPLY  CREAM TOPICALLY TO AFFECTED AREA AS NEEDED PRIOR TO PORT ACCESS. 30 g 1   lisinopril  (ZESTRIL ) 40 MG tablet Take 1 tablet (40 mg total) by mouth daily. 90 tablet 3   metFORMIN  (GLUCOPHAGE -XR) 500 MG 24 hr tablet Take 2 tablet in the morning and 1 tablet in th evening 90 tablet 6   Multiple Vitamin (MULTIVITAMIN) tablet Take 1  tablet by mouth daily.     vitamin B-12 (CYANOCOBALAMIN ) 1000 MCG tablet Take 1 tablet (1,000 mcg total) by mouth daily. 30 tablet 1   Vitamin D , Cholecalciferol, 25 MCG (1000 UT) TABS Take 1,000 Units by mouth.     anastrozole  (ARIMIDEX ) 1 MG tablet Take 1 tablet (1 mg total) by mouth daily. 90 tablet 1   No current facility-administered medications for this visit.   Facility-Administered Medications Ordered in Other Visits  Medication Dose Route Frequency Provider Last Rate Last Admin   heparin  lock flush 100 UNIT/ML injection              PHYSICAL EXAMINATION: ECOG PERFORMANCE STATUS: 2 - Symptomatic, <50% confined to bed Vitals:   07/06/24 1055  BP: (!) 167/81  Pulse: 88  Resp: (!) 6  Temp: (!) 97.2 F (36.2 C)  SpO2: 100%    Filed Weights   07/06/24 1055  Weight: 171 lb 12.8 oz (77.9 kg)      Physical Exam Constitutional:      General: She is not in acute distress. HENT:     Head: Normocephalic and atraumatic.  Eyes:     General: No scleral icterus. Cardiovascular:     Rate and Rhythm: Normal rate and regular rhythm.     Heart sounds: Normal heart sounds.  Pulmonary:     Effort: Pulmonary effort is normal. No respiratory distress.     Breath sounds: No wheezing.  Abdominal:     General: Bowel sounds are normal. There is no distension.     Palpations: Abdomen is soft.  Musculoskeletal:        General: No deformity. Normal range of motion.     Cervical back: Normal range of motion and neck supple.  Skin:    General: Skin is warm and dry.     Findings: No erythema or rash.  Neurological:     Mental Status: She is alert and oriented to person, place, and time. Mental status is at baseline.     Comments: Chronic left upper and lower extremity weakness.  Psychiatric:        Mood and Affect: Mood normal.   She declined breast exam     LABORATORY DATA:  I have reviewed the data as listed Lab Results  Component Value Date   WBC 5.1 07/06/2024   HGB 13.6  07/06/2024   HCT 40.5 07/06/2024   MCV 94.6 07/06/2024   PLT 187 07/06/2024   Recent Labs    01/06/24 1020 06/17/24 0944 07/06/24 1032  NA 137 140 139  K 3.3* 3.4* 3.9  CL 103 102 105  CO2 24 24 24   GLUCOSE 150* 115* 159*  BUN 16 16 18   CREATININE 0.76 0.74 0.86  CALCIUM  9.0 9.5 9.6  GFRNONAA >60  --  >60  PROT 6.6 7.2 6.9  ALBUMIN 4.2 4.9  4.2  AST 29 23 25   ALT 44 26 26  ALKPHOS 50 61 57  BILITOT 0.7 0.5 0.7   Iron/TIBC/Ferritin/ %Sat    Component Value Date/Time   IRON 113 12/08/2019 1015   TIBC 317 12/08/2019 1015   FERRITIN 67 12/08/2019 1015   IRONPCTSAT 36 12/08/2019 1015       RADIOGRAPHIC STUDIES: I have personally reviewed the radiological images as listed and agreed with the findings in the report. No results found.

## 2024-07-07 LAB — CANCER ANTIGEN 27.29: CA 27.29: 21.1 U/mL (ref 0.0–38.6)

## 2024-07-07 LAB — CANCER ANTIGEN 15-3: CA 15-3: 17.4 U/mL (ref 0.0–25.0)

## 2024-07-12 ENCOUNTER — Ambulatory Visit (INDEPENDENT_AMBULATORY_CARE_PROVIDER_SITE_OTHER): Admitting: *Deleted

## 2024-07-12 VITALS — Ht 61.0 in | Wt 170.0 lb

## 2024-07-12 DIAGNOSIS — Z0000 Encounter for general adult medical examination without abnormal findings: Secondary | ICD-10-CM

## 2024-07-12 NOTE — Progress Notes (Signed)
 Subjective:   Tammy Nunez is a 73 y.o. who presents for a Medicare Wellness preventive visit.  As a reminder, Annual Wellness Visits don't include a physical exam, and some assessments may be limited, especially if this visit is performed virtually. We may recommend an in-person follow-up visit with your provider if needed.  Visit Complete: Virtual I connected with  Tammy Nunez on 07/12/24 by a audio enabled telemedicine application and verified that I am speaking with the correct person using two identifiers.  Patient Location: Home  Provider Location: Home Office  I discussed the limitations of evaluation and management by telemedicine. The patient expressed understanding and agreed to proceed.  Vital Signs: Because this visit was a virtual/telehealth visit, some criteria may be missing or patient reported. Any vitals not documented were not able to be obtained and vitals that have been documented are patient reported.  VideoDeclined- This patient declined Librarian, academic. Therefore the visit was completed with audio only.  Persons Participating in Visit: Patient.  AWV Questionnaire: No: Patient Medicare AWV questionnaire was not completed prior to this visit.  Cardiac Risk Factors include: advanced age (>13men, >37 women);diabetes mellitus;dyslipidemia;hypertension;obesity (BMI >30kg/m2)     Objective:    Today's Vitals   07/12/24 0822  Weight: 170 lb (77.1 kg)  Height: 5' 1 (1.549 m)   Body mass index is 32.12 kg/m.     07/12/2024    8:31 AM 07/06/2024   10:52 AM 02/10/2024   12:50 PM 02/12/2023    1:14 PM 01/06/2023    9:49 AM 09/04/2022    2:36 PM 09/02/2022    1:09 PM  Advanced Directives  Does Patient Have a Medical Advance Directive? No No No No No No No  Would patient like information on creating a medical advance directive? No - Patient declined  No - Patient declined No - Patient declined No - Patient declined No -  Patient declined No - Patient declined    Current Medications (verified) Outpatient Encounter Medications as of 07/12/2024  Medication Sig   amLODipine  (NORVASC ) 5 MG tablet Take 1 tablet (5 mg total) by mouth daily.   anastrozole  (ARIMIDEX ) 1 MG tablet Take 1 tablet (1 mg total) by mouth daily.   aspirin  EC 81 MG tablet Take 1 tablet (81 mg total) by mouth daily.   atorvastatin  (LIPITOR ) 80 MG tablet Take 1 tablet (80 mg total) by mouth daily.   blood glucose meter kit and supplies 1 each by Other route 4 (four) times daily. Dispense based on patient and insurance preference. Four times daily as directed. (FOR ICD-10 E10.9, E11.9).   calcium  carbonate (OS-CAL) 600 MG tablet Take 2 tablets (1,200 mg total) by mouth daily.   carvedilol  (COREG ) 25 MG tablet Take 1 tablet (25 mg total) by mouth 2 (two) times daily with a meal.   clopidogrel  (PLAVIX ) 75 MG tablet Take 1 tablet (75 mg total) by mouth daily.   Continuous Glucose Sensor (FREESTYLE LIBRE 2 SENSOR) MISC USE TO CHECK BLOOD SUGAR AT LEAST EVERY 8 HOURS   empagliflozin  (JARDIANCE ) 25 MG TABS tablet Take 1 tablet (25 mg total) by mouth daily.   ezetimibe  (ZETIA ) 10 MG tablet Take 1 tablet (10 mg total) by mouth daily.   lidocaine -prilocaine  (EMLA ) cream APPLY  CREAM TOPICALLY TO AFFECTED AREA AS NEEDED PRIOR TO PORT ACCESS.   lisinopril  (ZESTRIL ) 40 MG tablet Take 1 tablet (40 mg total) by mouth daily.   metFORMIN  (GLUCOPHAGE -XR) 500 MG 24 hr  tablet Take 2 tablet in the morning and 1 tablet in th evening   Multiple Vitamin (MULTIVITAMIN) tablet Take 1 tablet by mouth daily.   vitamin B-12 (CYANOCOBALAMIN ) 1000 MCG tablet Take 1 tablet (1,000 mcg total) by mouth daily.   Vitamin D , Cholecalciferol, 25 MCG (1000 UT) TABS Take 1,000 Units by mouth.   [DISCONTINUED] prochlorperazine  (COMPAZINE ) 10 MG tablet Take 1 tablet (10 mg total) by mouth every 6 (six) hours as needed (Nausea or vomiting).   Facility-Administered Encounter Medications as  of 07/12/2024  Medication   heparin  lock flush 100 UNIT/ML injection    Allergies (verified) Patient has no known allergies.   History: Past Medical History:  Diagnosis Date   AKI (acute kidney injury) (HCC)    Anemia    Annual physical exam 10/09/2021   Breast cancer (HCC) 02/17/2022   Breast cancer (HCC) 02/17/2022   Breast cancer in female [facility]) 05/08/2021   Breast cancer in female [facility]) 05/08/2021   Surgery appt sch 05/09/21 Dr. Kandee [facility]         DIAGNOSIS:  A. BREAST, LEFT, 12 O'CLOCK, 5 CM FROM NIPPLE; ULTRASOUND-GUIDED CORE  BIOPSY:  - INVASIVE MAMMARY CARCINOMA, NO SPECIAL TYPE.  Size of invasive carcinoma: 2.5 mm in this sample  Histologic grade of invasive carcinoma: Grade 3                       Glandular/tubular differentiation score: 3                       Nu   Carotid stenosis, symptomatic, with infarction (HCC) 07/20/2019   Chemotherapy induced nausea and vomiting 06/28/2021   Chronic left shoulder pain    Coronary artery disease    x 1 stent   Diabetes mellitus without complication (HCC)    Dysrhythmia    Essential hypertension    Hypertension    Hypomagnesemia 09/13/2021   Invasive carcinoma of breast (HCC) 05/08/2021   New onset type 2 diabetes mellitus (HCC)    Numbness 06/03/2021   Obesity, diabetes, and hypertension syndrome (HCC) 10/11/2020   Paralysis (HCC)    weakness left upper amd lower extremity   PONV (postoperative nausea and vomiting)    Seizures (HCC)    Stroke [facility])    Past Surgical History:  Procedure Laterality Date   BREAST BIOPSY Left 05/07/2021   12:00 5cmfn vision - INVASIVE MAMMARY CARCINOMA,   BREAST BIOPSY Left 05/07/2021   11:00 10 cmfn mini cork clip INVASIVE MAMMARY CARCINOMA,   CAROTID PTA/STENT INTERVENTION Left 07/20/2019   Procedure: CAROTID PTA/STENT INTERVENTION;  Surgeon: Jama Cordella MATSU, MD;  Location: [facility] INVASIVE CV LAB;  Service: Cardiovascular;  Laterality: Left;   ECTOPIC PREGNANCY  SURGERY     PORTACATH PLACEMENT N/A 06/06/2021   Procedure: INSERTION PORT-A-CATH;  Surgeon: Rodolph Romano, MD;  Location: [facility] ORS;  Service: General;  Laterality: N/A;   SENTINEL NODE BIOPSY Left 02/17/2022   Procedure: SENTINEL NODE BIOPSY;  Surgeon: Rodolph Romano, MD;  Location: [facility] ORS;  Service: General;  Laterality: Left;   TOTAL MASTECTOMY Left 02/17/2022   Procedure: TOTAL MASTECTOMY;  Surgeon: Rodolph Romano, MD;  Location: [facility] ORS;  Service: General;  Laterality: Left;   Family History  Problem Relation Age of Onset   Diabetes type II Mother    Hypertension Father    Heart attack Father    Diabetes type II Brother    Breast cancer Neg Hx  Social History   Socioeconomic History   Marital status: Married    Spouse name: Marsha   Number of children: Not on file   Years of education: Not on file   Highest education level: Not on file  Occupational History   Not on file  Tobacco Use   Smoking status: Former    Current packs/day: 0.00    Average packs/day: 0.5 packs/day for 15.0 years (7.5 ttl pk-yrs)    Types: Cigarettes    Start date: 04/10/2004    Quit date: 04/11/2019    Years since quitting: 5.2   Smokeless tobacco: Never  Vaping Use   Vaping status: Never Used  Substance and Sexual Activity   Alcohol use: Never   Drug use: Never   Sexual activity: Not Currently  Other Topics Concern   Not on file  Social History Narrative   ** Merged History Encounter **       No kids  Married with husband  Used to work [employer], dispensing     Social Drivers of Corporate investment banker Strain: Low Risk  (07/12/2024)   Overall Financial Resource Strain (CARDIA)    Difficulty of Paying Living Expenses: Not hard at all  Food Insecurity: No Food Insecurity (07/12/2024)   Hunger Vital Sign    Worried About Running Out of Food in the Last Year: Never true    Ran Out of Food in the Last Year: Never true  Transportation Needs: No  Transportation Needs (07/12/2024)   PRAPARE - Administrator, Civil Service (Medical): No    Lack of Transportation (Non-Medical): No  Physical Activity: Inactive (07/12/2024)   Exercise Vital Sign    Days of Exercise per Week: 0 days    Minutes of Exercise per Session: 0 min  Stress: No Stress Concern Present (07/12/2024)   [employer] of Occupational Health - Occupational Stress Questionnaire    Feeling of Stress: Not at all  Social Connections: Moderately Integrated (07/12/2024)   Social Connection and Isolation Panel    Frequency of Communication with Friends and Family: More than three times a week    Frequency of Social Gatherings with Friends and Family: Twice a week    Attends Religious Services: More than 4 times per year    Active Member of [employer] or Organizations: No    Attends Engineer, structural: Never    Marital Status: Married    Tobacco Counseling Counseling given: Not Answered    Clinical Intake:  Pre-visit preparation completed: Yes  Pain : No/denies pain     BMI - recorded: 32.12 Nutritional Status: BMI > 30  Obese Nutritional Risks: None Diabetes: Yes CBG done?: Yes (per patient FBS 95)  Lab Results  Component Value Date   HGBA1C 7.0 (H) 06/17/2024   HGBA1C 6.9 (A) 12/15/2023   HGBA1C 7.2 (A) 05/26/2023     How often do you need to have someone help you when you read instructions, pamphlets, or other written materials from your doctor or pharmacy?: 1 - Never  Interpreter Needed?: No  Information entered by :: R. Zakia Sainato LPN   Activities of Daily Living     07/12/2024    8:24 AM  In your present state of health, do you have any difficulty performing the following activities:  Hearing? 0  Vision? 0  Difficulty concentrating or making decisions? 0  Walking or climbing stairs? 1  Dressing or bathing? 0  Doing errands, shopping? 1  Preparing Food  and eating ? N  Using the Toilet? N  In the past six months, have you  accidently leaked urine? N  Do you have problems with loss of bowel control? N  Managing your Medications? N  Managing your Finances? N  Housekeeping or managing your Housekeeping? N    Patient Care Team: Vincente Saber, NP as PCP - General (Nurse Practitioner) Dannielle Arlean FALCON, RN (Inactive) as Oncology Nurse Navigator Rodolph Romano, MD as Consulting Physician (General Surgery) Babara Call, MD as Consulting Physician (Oncology) Lenn Aran, MD as Consulting Physician (Radiation Oncology)  I have updated your Care Teams any recent Medical Services you may have received from other providers in the past year.     Assessment:   This is a routine wellness examination for Tammy Nunez.  Hearing/Vision screen Hearing Screening - Comments:: No issues Vision Screening - Comments:: No correction   Goals Addressed             This Visit's Progress    Patient Stated       Wants to lose some weight       Depression Screen     07/12/2024    8:28 AM 07/06/2024   10:55 AM 06/10/2024    1:18 PM 12/15/2023    8:20 AM 05/26/2023    9:39 AM 02/24/2023    8:18 AM 12/18/2022    8:38 AM  PHQ 2/9 Scores  PHQ - 2 Score 0 0 0 0 0 0 0  PHQ- 9 Score 0  0 0 0      Fall Risk     07/12/2024    8:26 AM 06/10/2024    1:17 PM 12/15/2023    8:20 AM 05/26/2023    9:38 AM 12/18/2022    8:37 AM  Fall Risk   Falls in the past year? 0 0 0 0 0  Number falls in past yr: 0 0 0 0 0  Injury with Fall? 0 0 0 0 0  Risk for fall due to : No Fall Risks No Fall Risks No Fall Risks No Fall Risks No Fall Risks  Follow up Falls evaluation completed;Falls prevention discussed Falls evaluation completed Falls evaluation completed Falls evaluation completed Falls evaluation completed      Data saved with a previous flowsheet row definition    MEDICARE RISK AT HOME:  Medicare Risk at Home Any stairs in or around the home?: No If so, are there any without handrails?: No Home free of loose throw rugs in  walkways, pet beds, electrical cords, etc?: Yes Adequate lighting in your home to reduce risk of falls?: Yes Life alert?: No Use of a cane, walker or w/c?: Yes Grab bars in the bathroom?: No Shower chair or bench in shower?: No Elevated toilet seat or a handicapped toilet?: No  TIMED UP AND GO:  Was the test performed?  No  Cognitive Function: 6CIT completed        07/12/2024    8:32 AM 09/04/2022    2:42 PM  6CIT Screen  What Year? 0 points 0 points  What month? 0 points 0 points  What time? 0 points 0 points  Count back from 20 2 points 0 points  Months in reverse 0 points 0 points  Repeat phrase 2 points 0 points  Total Score 4 points 0 points    Immunizations Immunization History  Administered Date(s) Administered   PFIZER Comirnaty(Gray Top)Covid-19 Tri-Sucrose Vaccine 03/26/2021   PFIZER(Purple Top)SARS-COV-2 Vaccination 01/11/2020, 02/01/2020, 09/04/2020   Pneumococcal Conjugate-13  10/11/2020    Screening Tests Health Maintenance  Topic Date Due   COVID-19 Vaccine (5 - 2024-25 season) 07/26/2023   FOOT EXAM  02/24/2024   DTaP/Tdap/Td (1 - Tdap) 12/14/2024 (Originally 01/31/1970)   Colonoscopy  12/14/2024 (Originally 02/01/1996)   INFLUENZA VACCINE  02/21/2025 (Originally 06/24/2024)   MAMMOGRAM  11/08/2024   HEMOGLOBIN A1C  12/18/2024   OPHTHALMOLOGY EXAM  04/25/2025   Diabetic kidney evaluation - Urine ACR  06/17/2025   Diabetic kidney evaluation - eGFR measurement  07/06/2025   Medicare Annual Wellness (AWV)  07/12/2025   DEXA SCAN  Completed   Hepatitis C Screening  Completed   HPV VACCINES  Aged Out   Meningococcal B Vaccine  Aged Out   Pneumococcal Vaccine  Discontinued   Pneumococcal Vaccine: 50+ Years  Discontinued   Zoster Vaccines- Shingrix  Discontinued    Health Maintenance  Health Maintenance Due  Topic Date Due   COVID-19 Vaccine (5 - 2024-25 season) 07/26/2023   FOOT EXAM  02/24/2024   Health Maintenance Items Addressed: Patient  declines vaccines and colonoscopy.  Patient reminded to call and schedule mammogram that was ordered 07/06/24.  Patient needs a diabetic foot exam completed and documented at upcoming visit.   Additional Screening:  Vision Screening: Recommended annual ophthalmology exams for early detection of glaucoma and other disorders of the eye. Up to date [facility] Would you like a referral to an eye doctor? No    Dental Screening: Recommended annual dental exams for proper oral hygiene  Community Resource Referral / Chronic Care Management: CRR required this visit?  No   CCM required this visit?  No   Plan:    I have personally reviewed and noted the following in the patient's chart:   Medical and social history Use of alcohol, tobacco or illicit drugs  Current medications and supplements including opioid prescriptions. Patient is not currently taking opioid prescriptions. Functional ability and status Nutritional status Physical activity Advanced directives List of other physicians Hospitalizations, surgeries, and ER visits in previous 12 months Vitals Screenings to include cognitive, depression, and falls Referrals and appointments  In addition, I have reviewed and discussed with patient certain preventive protocols, quality metrics, and best practice recommendations. A written personalized care plan for preventive services as well as general preventive health recommendations were provided to patient.   Angeline Fredericks, LPN   [id number]/[id number]   After Visit Summary: (Pick Up) Due to this being a telephonic visit, with patients personalized plan was offered to patient and patient has requested to Pick up at office.  Notes: Nothing significant to report at this time.

## 2024-07-12 NOTE — Patient Instructions (Signed)
 Tammy Nunez , Thank you for taking time out of your busy schedule to complete your Annual Wellness Visit with me. I enjoyed our conversation and look forward to speaking with you again next year. I, as well as your care team,  appreciate your ongoing commitment to your health goals. Please review the following plan we discussed and let me know if I can assist you in the future. Your Game plan/ To Do List    Referrals: If you haven't heard from the office you've been referred to, please reach out to them at the phone provided.  Remember to call and schedule your mammogram that was ordered 07/06/24. Consider updating your vaccines.  Follow up Visits: We will see or speak with you next year for your Next Medicare AWV with our clinical staff 07/17/25 @ 8:50 Have you seen your provider in the last 6 months (3 months if uncontrolled diabetes)? Yes  Clinician Recommendations:  Aim for 30 minutes of exercise or brisk walking, 6-8 glasses of water, and 5 servings of fruits and vegetables each day.       This is a list of the screenings recommended for you:  Health Maintenance  Topic Date Due   COVID-19 Vaccine (5 - 2024-25 season) 07/26/2023   Complete foot exam   02/24/2024   DTaP/Tdap/Td vaccine (1 - Tdap) 12/14/2024*   Colon Cancer Screening  12/14/2024*   Flu Shot  02/21/2025*   Mammogram  11/08/2024   Hemoglobin A1C  12/18/2024   Eye exam for diabetics  04/25/2025   Yearly kidney health urinalysis for diabetes  06/17/2025   Yearly kidney function blood test for diabetes  07/06/2025   Medicare Annual Wellness Visit  07/12/2025   DEXA scan (bone density measurement)  Completed   Hepatitis C Screening  Completed   HPV Vaccine  Aged Out   Meningitis B Vaccine  Aged Out   Pneumococcal Vaccine  Discontinued   Pneumococcal Vaccine for age over 15  Discontinued   Zoster (Shingles) Vaccine  Discontinued  *Topic was postponed. The date shown is not the original due date.    Advanced  directives: (Declined) Advance directive discussed with you today. Even though you declined this today, please call our office should you change your mind, and we can give you the proper paperwork for you to fill out. Advance Care Planning is important because it:  [x]  Makes sure you receive the medical care that is consistent with your values, goals, and preferences  [x]  It provides guidance to your family and loved ones and reduces their decisional burden about whether or not they are making the right decisions based on your wishes.  Follow the link provided in your after visit summary or read over the paperwork we have mailed to you to help you started getting your Advance Directives in place. If you need assistance in completing these, please reach out to us  so that we can help you!

## 2024-07-13 ENCOUNTER — Other Ambulatory Visit: Payer: Self-pay

## 2024-07-13 DIAGNOSIS — E1165 Type 2 diabetes mellitus with hyperglycemia: Secondary | ICD-10-CM

## 2024-07-13 MED ORDER — FREESTYLE LIBRE 2 SENSOR MISC: 5 refills | Status: AC

## 2024-07-14 ENCOUNTER — Telehealth: Payer: Self-pay

## 2024-07-14 ENCOUNTER — Ambulatory Visit

## 2024-07-14 VITALS — BP 122/74 | HR 83 | Temp 97.8°F | Ht 61.0 in | Wt 169.0 lb

## 2024-07-14 DIAGNOSIS — E785 Hyperlipidemia, unspecified: Secondary | ICD-10-CM

## 2024-07-14 DIAGNOSIS — Z7984 Long term (current) use of oral hypoglycemic drugs: Secondary | ICD-10-CM

## 2024-07-14 DIAGNOSIS — I6529 Occlusion and stenosis of unspecified carotid artery: Secondary | ICD-10-CM

## 2024-07-14 DIAGNOSIS — I69354 Hemiplegia and hemiparesis following cerebral infarction affecting left non-dominant side: Secondary | ICD-10-CM

## 2024-07-14 DIAGNOSIS — I10 Essential (primary) hypertension: Secondary | ICD-10-CM

## 2024-07-14 DIAGNOSIS — I639 Cerebral infarction, unspecified: Secondary | ICD-10-CM

## 2024-07-14 DIAGNOSIS — E1169 Type 2 diabetes mellitus with other specified complication: Secondary | ICD-10-CM | POA: Diagnosis not present

## 2024-07-14 DIAGNOSIS — I6523 Occlusion and stenosis of bilateral carotid arteries: Secondary | ICD-10-CM

## 2024-07-14 DIAGNOSIS — Z79811 Long term (current) use of aromatase inhibitors: Secondary | ICD-10-CM

## 2024-07-14 DIAGNOSIS — E118 Type 2 diabetes mellitus with unspecified complications: Secondary | ICD-10-CM

## 2024-07-14 DIAGNOSIS — E1165 Type 2 diabetes mellitus with hyperglycemia: Secondary | ICD-10-CM

## 2024-07-14 MED ORDER — CARVEDILOL 25 MG PO TABS: 25.0000 mg | ORAL_TABLET | Freq: Two times a day (BID) | ORAL | 3 refills | Status: AC

## 2024-07-14 MED ORDER — LISINOPRIL 40 MG PO TABS: 40.0000 mg | ORAL_TABLET | Freq: Every day | ORAL | 3 refills | Status: AC

## 2024-07-14 MED ORDER — METFORMIN HCL ER 500 MG PO TB24: ORAL_TABLET | ORAL | 6 refills | Status: AC

## 2024-07-14 MED ORDER — CLOPIDOGREL BISULFATE 75 MG PO TABS
75.0000 mg | ORAL_TABLET | Freq: Every day | ORAL | 3 refills | Status: AC
Start: 2024-07-14 — End: ?

## 2024-07-14 MED ORDER — ATORVASTATIN CALCIUM 80 MG PO TABS: 80.0000 mg | ORAL_TABLET | Freq: Every day | ORAL | 3 refills | Status: AC

## 2024-07-14 MED ORDER — EZETIMIBE 10 MG PO TABS: 10.0000 mg | ORAL_TABLET | Freq: Every day | ORAL | 3 refills | Status: AC

## 2024-07-14 MED ORDER — AMLODIPINE BESYLATE 5 MG PO TABS: 5.0000 mg | ORAL_TABLET | Freq: Every day | ORAL | 3 refills | Status: AC

## 2024-07-14 MED ORDER — ASPIRIN 81 MG PO TBEC: 81.0000 mg | DELAYED_RELEASE_TABLET | Freq: Every day | ORAL | 3 refills | Status: AC

## 2024-07-14 MED ORDER — EMPAGLIFLOZIN 25 MG PO TABS: 25.0000 mg | ORAL_TABLET | Freq: Every day | ORAL | 3 refills | Status: AC

## 2024-07-14 NOTE — Assessment & Plan Note (Signed)
 Reviewed A1c, urine microalbumin, lipid panel from 06/17/2024.   A1c 7.0%.  Continue metformin  1000 mg in the morning and 500 mg in the evening, Jardiance  25 mg daily.  Medication side effects and benefits discussed with the patient.  LDL within goal, continue atorvastatin  80 mg.  Discussed risk and benefit of checking blood glucose at home.  She has not have episodes of hypoglycemia, symptoms related to hypoglycemia.  She can check blood glucose once a day as needed.  Normal urine microalbumin on 06/17/2024, on lisinopril  40 mg daily continue.

## 2024-07-14 NOTE — Assessment & Plan Note (Addendum)
 This is chronic and LDL has been within goal.  Patient to continue taking atorvastatin  80 mg, Zetia  10 mg daily.  Discussed on incorporating Mediterranean like diet, exercise as tolerated.

## 2024-07-14 NOTE — Assessment & Plan Note (Signed)
 Blood pressure within goal today.  Continue amlodipine  5 mg daily, lisinopril  40 mg daily, carvedilol  25 mg twice a day.

## 2024-07-14 NOTE — Assessment & Plan Note (Signed)
 This is chronic and has been stable since MCA stroke in 06/09/2019 with residual left upper and lower extremities weakness. Currently not following up with neurology, physical therapy. Discussed benefit of physical therapy, regular exercise to reduce risk of fall, fracture.  Patient declines.  Will continue to assess neurological function, ADL needs in the future.

## 2024-07-14 NOTE — Assessment & Plan Note (Signed)
 Management per hemato/oncologist Dr. Babara Continue vitamin D  1000 units daily, calcium  1200 mg daily.

## 2024-07-14 NOTE — Progress Notes (Signed)
 Established Patient Office Visit   Subjective  Patient ID: Tammy Nunez, female    DOB: 1951/10/10  Age: 73 y.o. MRN: [id number]  Chief Complaint  Patient presents with   Establish Care    Transfer of Care    She  has a past medical history of AKI (acute kidney injury) (HCC), Anemia, Annual physical exam (10/09/2021), Breast cancer (HCC) (02/17/2022), Breast cancer (HCC) (02/17/2022), Breast cancer in female [facility]) (05/08/2021), Breast cancer in female [facility]) (05/08/2021), Carotid stenosis, symptomatic, with infarction (HCC) (07/20/2019), Chemotherapy induced nausea and vomiting (06/28/2021), Chronic left shoulder pain, Coronary artery disease, Diabetes mellitus without complication (HCC), Dysrhythmia, Essential hypertension, Hypertension, Hypomagnesemia (09/13/2021), Invasive carcinoma of breast (HCC) (05/08/2021), New onset type 2 diabetes mellitus (HCC), Numbness (06/03/2021), Obesity, diabetes, and hypertension syndrome (HCC) (10/11/2020), Paralysis (HCC), PONV (postoperative nausea and vomiting), Seizures (HCC), and Stroke (HCC).  HPI Discussed the use of AI scribe software for clinical note transcription with the patient, who gave verbal consent to proceed.  History of Present Illness Tammy Nunez is a 73 year old female who presents to establish care.  She is accompanied by her husband, Ferdie.  - Patient with a history of right MCA stroke, with history of bilateral carotid artery stenosis needing hospitalization in 06/09/2019 and remanent left-sided weakness.  Patient continues to follow-up with vascular surgery department.  Has upcoming appointment on 12/15/2024.  She is currently on aspirin  81 mg daily, Plavix  75 mg daily, atorvastatin  80 mg daily, Coreg  25 mg twice daily.  Denies new weakness, neurological symptoms.  Patient uses cane for ambulation.  Reports has not noted new weakness, fall.  Underwent physical therapy post stroke and has been at baseline since then.  -  History of left breast invasive carcinoma, s/p chemo, left mastectomy with current follow-up with hematology, endocrinology.  She is currently on Arimidex  1 mg daily.  She continues to take calcium  1200 mg daily, vitamin D  1000 units daily.  Patient also has a port in place. Dr. Babara recommended port removal on 07/06/2024 but patient prefers to keep the port in place.    - Type II DM with complications:  Patient is currently on metformin  500 mg (takes 2 tab in Am and 1 tab in PM), Jardiance  25 mg.  She checks blood glucose twice a day and typically readings ranging in 90-120.  Her diet includes a lot of vegetables and proteins but not much fruit.  She is on lisinopril  40 mg daily, amlodipine  5 mg daily, Zetia  10 mg daily.  Uses libre 2 to monitor home blood glucose.    ROS As per HPI    Objective:     BP 122/74   Pulse 83   Temp 97.8 F (36.6 C)   Ht 5' 1 (1.549 m)   Wt 169 lb (76.7 kg)   SpO2 98%   BMI 31.93 kg/m      07/14/2024    9:53 AM 07/12/2024    8:28 AM 07/06/2024   10:55 AM  Depression screen PHQ 2/9  Decreased Interest 0 0 0  Down, Depressed, Hopeless 0 0 0  PHQ - 2 Score 0 0 0  Altered sleeping 0 0   Tired, decreased energy 0 0   Change in appetite 0 0   Feeling bad or failure about yourself  0 0   Trouble concentrating 0 0   Moving slowly or fidgety/restless 0 0   Suicidal thoughts 0 0   PHQ-9 Score 0 0   Difficult  doing work/chores Not difficult at all Not difficult at all       07/14/2024    9:53 AM 06/10/2024    1:18 PM 12/15/2023    8:20 AM 05/26/2023    9:39 AM  GAD 7 : Generalized Anxiety Score  Nervous, Anxious, on Edge 0 0 0 0  Control/stop worrying 0 0 0 0  Worry too much - different things 0 0 0 0  Trouble relaxing 0 0 0 0  Restless 0 0 0 0  Easily annoyed or irritable 0 0 0 0  Afraid - awful might happen 0 0 0 0  Total GAD 7 Score 0 0 0 0  Anxiety Difficulty Not difficult at all Not difficult at all Not difficult at all Not difficult at all       07/14/2024    9:53 AM 07/12/2024    8:28 AM 07/06/2024   10:55 AM  Depression screen PHQ 2/9  Decreased Interest 0 0 0  Down, Depressed, Hopeless 0 0 0  PHQ - 2 Score 0 0 0  Altered sleeping 0 0   Tired, decreased energy 0 0   Change in appetite 0 0   Feeling bad or failure about yourself  0 0   Trouble concentrating 0 0   Moving slowly or fidgety/restless 0 0   Suicidal thoughts 0 0   PHQ-9 Score 0 0   Difficult doing work/chores Not difficult at all Not difficult at all       07/14/2024    9:53 AM 06/10/2024    1:18 PM 12/15/2023    8:20 AM 05/26/2023    9:39 AM  GAD 7 : Generalized Anxiety Score  Nervous, Anxious, on Edge 0 0 0 0  Control/stop worrying 0 0 0 0  Worry too much - different things 0 0 0 0  Trouble relaxing 0 0 0 0  Restless 0 0 0 0  Easily annoyed or irritable 0 0 0 0  Afraid - awful might happen 0 0 0 0  Total GAD 7 Score 0 0 0 0  Anxiety Difficulty Not difficult at all Not difficult at all Not difficult at all Not difficult at all   SDOH Screenings   Food Insecurity: No Food Insecurity (07/12/2024)  Housing: Unknown (07/12/2024)  Transportation Needs: No Transportation Needs (07/12/2024)  Utilities: Not At Risk (07/12/2024)  Alcohol Screen: Low Risk  (07/12/2024)  Depression (PHQ2-9): Low Risk  (07/14/2024)  Financial Resource Strain: Low Risk  (07/12/2024)  Physical Activity: Inactive (07/12/2024)  Social Connections: Moderately Integrated (07/12/2024)  Stress: No Stress Concern Present (07/12/2024)  Tobacco Use: Medium Risk (07/14/2024)  Health Literacy: Adequate Health Literacy (07/12/2024)     Physical Exam Constitutional:      Appearance: She is obese.  HENT:     Head: Normocephalic and atraumatic.     Right Ear: Tympanic membrane normal.     Left Ear: Tympanic membrane normal.  Eyes:     Pupils: Pupils are equal, round, and reactive to light.  Cardiovascular:     Rate and Rhythm: Normal rate.     Heart sounds: No murmur heard. Pulmonary:      Effort: Pulmonary effort is normal.     Breath sounds: No wheezing.  Chest:       Comments: Healed surgical scar as noted above  Abdominal:     Tenderness: There is no abdominal tenderness. There is no guarding or rebound.  Musculoskeletal:     Cervical back: Neck supple.  Right lower leg: No edema.     Left lower leg: No edema.  Lymphadenopathy:     Cervical: No cervical adenopathy.  Skin:    General: Skin is warm.  Neurological:     Mental Status: She is alert and oriented to person, place, and time.     Motor: Weakness (affecting left upper an dlower extremety, reminent wekaness from stroke) present.     Gait: Gait abnormal (Asymmetrical gait with reduced stance time on left side, patient relies on right leg for support using cane for balance, reduced step length on the left side than right).  Psychiatric:        Mood and Affect: Mood normal.        No results found for any visits on 07/14/24.  The ASCVD Risk score (Arnett DK, et al., 2019) failed to calculate for the following reasons:   Risk score cannot be calculated because patient has a medical history suggesting prior/existing ASCVD     Assessment & Plan:   Asymptomatic bilateral carotid artery stenosis Assessment & Plan: Chronic.  Stable, asymptomatic.  Continue DAPT, atorvastatin  80 mg, close follow-up with vascular surgery.   Refill sent.   Essential hypertension -     amLODIPine  Besylate; Take 1 tablet (5 mg total) by mouth daily.  Dispense: 90 tablet; Refill: 3 -     Carvedilol ; Take 1 tablet (25 mg total) by mouth 2 (two) times daily with a meal.  Dispense: 180 tablet; Refill: 3 -     Lisinopril ; Take 1 tablet (40 mg total) by mouth daily.  Dispense: 90 tablet; Refill: 3  Cerebrovascular accident (CVA), unspecified mechanism (HCC) -     Aspirin ; Take 1 tablet (81 mg total) by mouth daily.  Dispense: 90 tablet; Refill: 3 -     Clopidogrel  Bisulfate; Take 1 tablet (75 mg total) by mouth daily.   Dispense: 90 tablet; Refill: 3  Hyperlipidemia associated with type 2 diabetes mellitus (HCC) Assessment & Plan: This is chronic and LDL has been within goal.  Patient to continue taking atorvastatin  80 mg, Zetia  10 mg daily.  Discussed on incorporating Mediterranean like diet, exercise as tolerated.  Orders: -     Atorvastatin  Calcium ; Take 1 tablet (80 mg total) by mouth daily.  Dispense: 90 tablet; Refill: 3 -     Ezetimibe ; Take 1 tablet (10 mg total) by mouth daily.  Dispense: 90 tablet; Refill: 3  Type 2 diabetes mellitus with complications [facility]) Assessment & Plan: Reviewed A1c, urine microalbumin, lipid panel from 06/17/2024.   A1c 7.0%.  Continue metformin  1000 mg in the morning and 500 mg in the evening, Jardiance  25 mg daily.  Medication side effects and benefits discussed with the patient.  LDL within goal, continue atorvastatin  80 mg.  Discussed risk and benefit of checking blood glucose at home.  She has not have episodes of hypoglycemia, symptoms related to hypoglycemia.  She can check blood glucose once a day as needed.  Normal urine microalbumin on 06/17/2024, on lisinopril  40 mg daily continue.  Orders: -     Empagliflozin ; Take 1 tablet (25 mg total) by mouth daily.  Dispense: 90 tablet; Refill: 3 -     metFORMIN  HCl ER; Take 2 tablet in the morning and 1 tablet in th evening  Dispense: 90 tablet; Refill: 6  Hemiplegia and hemiparesis following cerebral infarction affecting left non-dominant side [facility]) Assessment & Plan: This is chronic and has been stable since MCA stroke in 06/09/2019 with residual left upper  and lower extremities weakness. Currently not following up with neurology, physical therapy. Discussed benefit of physical therapy, regular exercise to reduce risk of fall, fracture.  Patient declines.  Will continue to assess neurological function, ADL needs in the future.   Aromatase inhibitor use Assessment & Plan: Management per hemato/oncologist Dr. Babara Continue  vitamin D  1000 units daily, calcium  1200 mg daily.     Return in about 4 months (around 11/13/2024) for DM II f/u .   Luke Shade, MD

## 2024-07-14 NOTE — Assessment & Plan Note (Signed)
 Chronic.  Stable, asymptomatic.  Continue DAPT, atorvastatin  80 mg, close follow-up with vascular surgery.   Refill sent.

## 2024-07-14 NOTE — Assessment & Plan Note (Signed)
>>  ASSESSMENT AND PLAN FOR CEREBROVASCULAR ACCIDENT (CVA) (HCC) WRITTEN ON 07/14/2024 10:39 AM BY ABBEY BRUCKNER, MD  This is chronic and has been stable since MCA stroke in 06/09/2019 with residual left upper and lower extremities weakness. Currently not following up with neurology, physical therapy. Discussed benefit of physical therapy, regular exercise to reduce risk of fall, fracture.  Patient declines.  Will continue to assess neurological function, ADL needs in the future.

## 2024-07-14 NOTE — Telephone Encounter (Signed)
 Called [facility] on [address]. Pharmacist confirmed that the pharmacy received the prescription for the [facility] 2 and it is ready for pick up.

## 2024-07-14 NOTE — Patient Instructions (Signed)
-   Follow up with me in 4 months. We will update your hemoglobin A1c during your next visit.   - Please let me know if you have problem with [facility] 2 sensor, I will send you a new one if your pharmacy does no have prescription for it.   - I am taking over all your prescription medications except for the cancer medication.

## 2024-07-14 NOTE — Assessment & Plan Note (Signed)
>>  ASSESSMENT AND PLAN FOR ESSENTIAL HYPERTENSION WRITTEN ON 07/14/2024 10:40 AM BY Lexington Krotz, MD  Blood pressure within goal today.  Continue amlodipine  5 mg daily, lisinopril  40 mg daily, carvedilol  25 mg twice a day.

## 2024-08-25 ENCOUNTER — Other Ambulatory Visit: Payer: Self-pay | Admitting: Oncology

## 2024-08-25 DIAGNOSIS — C50912 Malignant neoplasm of unspecified site of left female breast: Secondary | ICD-10-CM

## 2024-08-31 ENCOUNTER — Inpatient Hospital Stay: Attending: Radiation Oncology

## 2024-08-31 DIAGNOSIS — C50912 Malignant neoplasm of unspecified site of left female breast: Secondary | ICD-10-CM | POA: Diagnosis present

## 2024-08-31 DIAGNOSIS — Z1732 Human epidermal growth factor receptor 2 negative status: Secondary | ICD-10-CM | POA: Diagnosis not present

## 2024-08-31 DIAGNOSIS — Z170 Estrogen receptor positive status [ER+]: Secondary | ICD-10-CM | POA: Insufficient documentation

## 2024-08-31 DIAGNOSIS — Z1721 Progesterone receptor positive status: Secondary | ICD-10-CM | POA: Insufficient documentation

## 2024-08-31 DIAGNOSIS — Z79811 Long term (current) use of aromatase inhibitors: Secondary | ICD-10-CM | POA: Diagnosis not present

## 2024-10-26 ENCOUNTER — Inpatient Hospital Stay: Attending: Radiation Oncology

## 2024-11-09 ENCOUNTER — Inpatient Hospital Stay: Admission: RE | Admit: 2024-11-09 | Discharge: 2024-11-09 | Attending: Oncology

## 2024-11-09 DIAGNOSIS — Z853 Personal history of malignant neoplasm of breast: Secondary | ICD-10-CM | POA: Insufficient documentation

## 2024-11-09 DIAGNOSIS — Z1231 Encounter for screening mammogram for malignant neoplasm of breast: Secondary | ICD-10-CM | POA: Insufficient documentation

## 2024-11-09 DIAGNOSIS — C50919 Malignant neoplasm of unspecified site of unspecified female breast: Secondary | ICD-10-CM

## 2024-11-15 ENCOUNTER — Ambulatory Visit

## 2024-12-12 ENCOUNTER — Other Ambulatory Visit (INDEPENDENT_AMBULATORY_CARE_PROVIDER_SITE_OTHER): Payer: Self-pay | Admitting: Vascular Surgery

## 2024-12-12 DIAGNOSIS — I6529 Occlusion and stenosis of unspecified carotid artery: Secondary | ICD-10-CM

## 2024-12-14 NOTE — Progress Notes (Unsigned)
 "                         MRN : [id number]  Tammy Nunez is a 74 y.o. (1951-04-23) female who presents with chief complaint of check carotid arteries.  History of Present Illness:   The patient is seen for follow up evaluation of carotid stenosis status right carotid stent on 07/20/2019.  There were no post operative problems or complications related to the surgery.  The patient denies neck or incisional pain.   The patient denies interval amaurosis fugax. There is no recent history of TIA symptoms or focal motor deficits. There is no prior documented CVA.   The patient denies headache.   The patient is taking enteric-coated aspirin  81 mg daily.   The patient has a history of coronary artery disease, no recent episodes of angina or shortness of breath. The patient denies PAD or claudication symptoms. There is a history of hyperlipidemia which is being treated with a statin.    Carotid Duplex done today shows 1-39% RICA s/p stent and 1-39% LICA.  No change compared to last study in 12/14/2023.   Active Medications[1]  Past Medical History:  Diagnosis Date   AKI (acute kidney injury)    Anemia    Annual physical exam 10/09/2021   Breast cancer (HCC) 02/17/2022   Breast cancer (HCC) 02/17/2022   Breast cancer in female [facility]) 05/08/2021   Breast cancer in female [facility]) 05/08/2021   Surgery appt sch 05/09/21 Dr. Kandee [facility]         DIAGNOSIS:  A. BREAST, LEFT, 12 O'CLOCK, 5 CM FROM NIPPLE; ULTRASOUND-GUIDED CORE  BIOPSY:  - INVASIVE MAMMARY CARCINOMA, NO SPECIAL TYPE.  Size of invasive carcinoma: 2.5 mm in this sample  Histologic grade of invasive carcinoma: Grade 3                       Glandular/tubular differentiation score: 3                       Nu   Carotid stenosis, symptomatic, with infarction (HCC) 07/20/2019   Chemotherapy induced nausea and vomiting 06/28/2021   Chronic left shoulder pain    Coronary artery disease    x 1 stent   Diabetes mellitus  without complication (HCC)    Dysrhythmia    Essential hypertension    Hypertension    Hypomagnesemia 09/13/2021   Invasive carcinoma of breast (HCC) 05/08/2021   New onset type 2 diabetes mellitus (HCC)    Numbness 06/03/2021   Obesity, diabetes, and hypertension syndrome (HCC) 10/11/2020   Paralysis (HCC)    weakness left upper amd lower extremity   PONV (postoperative nausea and vomiting)    Seizures (HCC)    Stroke [facility])     Past Surgical History:  Procedure Laterality Date   BREAST BIOPSY Left 05/07/2021   12:00 5cmfn vision - INVASIVE MAMMARY CARCINOMA,   BREAST BIOPSY Left 05/07/2021   11:00 10 cmfn mini cork clip INVASIVE MAMMARY CARCINOMA,   CAROTID PTA/STENT INTERVENTION Left 07/20/2019   Procedure: CAROTID PTA/STENT INTERVENTION;  Surgeon: Jama Cordella MATSU, MD;  Location: [facility] INVASIVE CV LAB;  Service: Cardiovascular;  Laterality: Left;   ECTOPIC PREGNANCY SURGERY     PORTACATH PLACEMENT N/A 06/06/2021   Procedure: INSERTION PORT-A-CATH;  Surgeon: Rodolph Romano, MD;  Location: [facility] ORS;  Service: General;  Laterality: N/A;   SENTINEL NODE BIOPSY Left 02/17/2022  Procedure: SENTINEL NODE BIOPSY;  Surgeon: Rodolph Romano, MD;  Location: [facility] ORS;  Service: General;  Laterality: Left;   TOTAL MASTECTOMY Left 02/17/2022   Procedure: TOTAL MASTECTOMY;  Surgeon: Rodolph Romano, MD;  Location: [facility] ORS;  Service: General;  Laterality: Left;    Social History Social History[2]  Family History Family History  Problem Relation Age of Onset   Diabetes type II Mother    Hypertension Father    Heart attack Father    Diabetes type II Brother    Breast cancer Neg Hx     Allergies[3]   REVIEW OF SYSTEMS (Negative unless checked)  Constitutional: [] Weight loss  [] Fever  [] Chills Cardiac: [] Chest pain   [] Chest pressure   [] Palpitations   [] Shortness of breath when laying flat   [] Shortness of breath with exertion. Vascular:  [x] Pain in legs  with walking   [] Pain in legs at rest  [] History of DVT   [] Phlebitis   [] Swelling in legs   [] Varicose veins   [] Non-healing ulcers Pulmonary:   [] Uses home oxygen   [] Productive cough   [] Hemoptysis   [] Wheeze  [] COPD   [] Asthma Neurologic:  [] Dizziness   [] Seizures   [] History of stroke   [] History of TIA  [] Aphasia   [] Vissual changes   [] Weakness or numbness in arm   [] Weakness or numbness in leg Musculoskeletal:   [] Joint swelling   [] Joint pain   [] Low back pain Hematologic:  [] Easy bruising  [] Easy bleeding   [] Hypercoagulable state   [] Anemic Gastrointestinal:  [] Diarrhea   [] Vomiting  [] Gastroesophageal reflux/heartburn   [] Difficulty swallowing. Genitourinary:  [] Chronic kidney disease   [] Difficult urination  [] Frequent urination   [] Blood in urine Skin:  [] Rashes   [] Ulcers  Psychological:  [] History of anxiety   []  History of major depression.  Physical Examination  There were no vitals filed for this visit. There is no height or weight on file to calculate BMI. Gen: WD/WN, NAD Head: [place]/AT, No temporalis wasting.  Ear/Nose/Throat: Hearing grossly intact, nares w/o erythema or drainage Eyes: PER, EOMI, sclera nonicteric.  Neck: Supple, no masses.  No bruit or JVD.  Pulmonary:  Good air movement, no audible wheezing, no use of accessory muscles.  Cardiac: RRR, normal S1, S2, no Murmurs. Vascular:  carotid bruit noted Vessel Right Left  Radial Palpable Palpable  Carotid  Palpable  Palpable  Subclav  Palpable Palpable  Gastrointestinal: soft, non-distended. No guarding/no peritoneal signs.  Musculoskeletal: M/S 5/5 throughout.  No visible deformity.  Neurologic: CN 2-12 intact. Pain and light touch intact in extremities.  Symmetrical.  Speech is fluent. Motor exam as listed above. Psychiatric: Judgment intact, Mood & affect appropriate for pt's clinical situation. Dermatologic: No rashes or ulcers noted.  No changes consistent with cellulitis.   CBC Lab Results  Component  Value Date   WBC 5.1 07/06/2024   HGB 13.6 07/06/2024   HCT 40.5 07/06/2024   MCV 94.6 07/06/2024   PLT 187 07/06/2024    BMET    Component Value Date/Time   NA 139 07/06/2024 1032   K 3.9 07/06/2024 1032   CL 105 07/06/2024 1032   CO2 24 07/06/2024 1032   GLUCOSE 159 (H) 07/06/2024 1032   BUN 18 07/06/2024 1032   CREATININE 0.86 07/06/2024 1032   CALCIUM  9.6 07/06/2024 1032   GFRNONAA >60 07/06/2024 1032   GFRAA >60 07/21/2019 0501   CrCl cannot be calculated (Patient's most recent lab result is older than the maximum 21 days allowed.).  COAG  Lab Results  Component Value Date   INR 1.0 06/06/2019    Radiology No results found.   Assessment/Plan There are no diagnoses linked to this encounter.   Cordella Shawl, MD  12/14/2024 2:38 PM      [1]  No outpatient medications have been marked as taking for the 12/15/24 encounter (Appointment) with Shawl, Cordella MATSU, MD.  [2]  Social History Tobacco Use   Smoking status: Former    Current packs/day: 0.00    Average packs/day: 0.5 packs/day for 15.0 years (7.5 ttl pk-yrs)    Types: Cigarettes    Start date: 04/10/2004    Quit date: 04/11/2019    Years since quitting: 5.6   Smokeless tobacco: Never  Vaping Use   Vaping status: Never Used  Substance Use Topics   Alcohol use: Never   Drug use: Never  [3] No Known Allergies  "

## 2024-12-15 ENCOUNTER — Other Ambulatory Visit (INDEPENDENT_AMBULATORY_CARE_PROVIDER_SITE_OTHER): Payer: PPO

## 2024-12-15 ENCOUNTER — Ambulatory Visit (INDEPENDENT_AMBULATORY_CARE_PROVIDER_SITE_OTHER): Payer: PPO | Admitting: Vascular Surgery

## 2024-12-15 ENCOUNTER — Encounter (INDEPENDENT_AMBULATORY_CARE_PROVIDER_SITE_OTHER): Payer: Self-pay | Admitting: Vascular Surgery

## 2024-12-15 VITALS — BP 157/84 | HR 89 | Resp 18 | Ht 60.0 in | Wt 173.0 lb

## 2024-12-15 DIAGNOSIS — I6523 Occlusion and stenosis of bilateral carotid arteries: Secondary | ICD-10-CM

## 2024-12-15 DIAGNOSIS — E785 Hyperlipidemia, unspecified: Secondary | ICD-10-CM | POA: Diagnosis not present

## 2024-12-15 DIAGNOSIS — I6529 Occlusion and stenosis of unspecified carotid artery: Secondary | ICD-10-CM

## 2024-12-15 DIAGNOSIS — I152 Hypertension secondary to endocrine disorders: Secondary | ICD-10-CM

## 2024-12-15 DIAGNOSIS — E118 Type 2 diabetes mellitus with unspecified complications: Secondary | ICD-10-CM | POA: Diagnosis not present

## 2024-12-15 DIAGNOSIS — E1169 Type 2 diabetes mellitus with other specified complication: Secondary | ICD-10-CM | POA: Diagnosis not present

## 2024-12-15 DIAGNOSIS — E1159 Type 2 diabetes mellitus with other circulatory complications: Secondary | ICD-10-CM | POA: Diagnosis not present

## 2024-12-21 ENCOUNTER — Inpatient Hospital Stay: Attending: Radiation Oncology

## 2025-01-05 ENCOUNTER — Ambulatory Visit: Admitting: Oncology

## 2025-01-05 ENCOUNTER — Other Ambulatory Visit

## 2025-02-17 ENCOUNTER — Ambulatory Visit

## 2025-07-17 ENCOUNTER — Ambulatory Visit

## 2025-12-18 ENCOUNTER — Ambulatory Visit (INDEPENDENT_AMBULATORY_CARE_PROVIDER_SITE_OTHER): Admitting: Vascular Surgery

## 2025-12-18 ENCOUNTER — Encounter (INDEPENDENT_AMBULATORY_CARE_PROVIDER_SITE_OTHER)
